# Patient Record
Sex: Female | Born: 1950 | Race: Black or African American | Hispanic: No | Marital: Single | Smoking: Former smoker
Health system: Southern US, Community
[De-identification: ages and names within clinical notes are randomized; demographics above are authoritative.]

## PROBLEM LIST (undated history)

## (undated) DIAGNOSIS — I639 Cerebral infarction, unspecified: Secondary | ICD-10-CM

## (undated) DIAGNOSIS — I2510 Atherosclerotic heart disease of native coronary artery without angina pectoris: Secondary | ICD-10-CM

## (undated) DIAGNOSIS — D71 Functional disorders of polymorphonuclear neutrophils: Secondary | ICD-10-CM

## (undated) DIAGNOSIS — I5189 Other ill-defined heart diseases: Secondary | ICD-10-CM

## (undated) DIAGNOSIS — Z9841 Cataract extraction status, right eye: Secondary | ICD-10-CM

## (undated) DIAGNOSIS — C801 Malignant (primary) neoplasm, unspecified: Secondary | ICD-10-CM

## (undated) DIAGNOSIS — K760 Fatty (change of) liver, not elsewhere classified: Secondary | ICD-10-CM

## (undated) DIAGNOSIS — E785 Hyperlipidemia, unspecified: Secondary | ICD-10-CM

## (undated) DIAGNOSIS — K219 Gastro-esophageal reflux disease without esophagitis: Secondary | ICD-10-CM

## (undated) DIAGNOSIS — B019 Varicella without complication: Secondary | ICD-10-CM

## (undated) DIAGNOSIS — I10 Essential (primary) hypertension: Secondary | ICD-10-CM

## (undated) DIAGNOSIS — E236 Other disorders of pituitary gland: Secondary | ICD-10-CM

## (undated) DIAGNOSIS — J45909 Unspecified asthma, uncomplicated: Secondary | ICD-10-CM

## (undated) DIAGNOSIS — Z9842 Cataract extraction status, left eye: Secondary | ICD-10-CM

## (undated) DIAGNOSIS — M1990 Unspecified osteoarthritis, unspecified site: Secondary | ICD-10-CM

## (undated) HISTORY — DX: Cerebral infarction, unspecified: I63.9

## (undated) HISTORY — DX: Fatty (change of) liver, not elsewhere classified: K76.0

## (undated) HISTORY — DX: Functional disorders of polymorphonuclear neutrophils: D71

## (undated) HISTORY — DX: Varicella without complication: B01.9

## (undated) HISTORY — DX: Unspecified osteoarthritis, unspecified site: M19.90

## (undated) HISTORY — DX: Unspecified asthma, uncomplicated: J45.909

## (undated) HISTORY — DX: Other ill-defined heart diseases: I51.89

## (undated) HISTORY — DX: Atherosclerotic heart disease of native coronary artery without angina pectoris: I25.10

## (undated) HISTORY — PX: TONSILLECTOMY AND ADENOIDECTOMY: SHX28

## (undated) HISTORY — DX: Hyperlipidemia, unspecified: E78.5

## (undated) HISTORY — DX: Other disorders of pituitary gland: E23.6

## (undated) HISTORY — DX: Gastro-esophageal reflux disease without esophagitis: K21.9

## (undated) HISTORY — DX: Essential (primary) hypertension: I10

---

## 1990-06-08 HISTORY — PX: CARPAL TUNNEL RELEASE: SHX101

## 1997-12-03 ENCOUNTER — Other Ambulatory Visit: Admission: RE | Admit: 1997-12-03 | Discharge: 1997-12-03 | Payer: Self-pay | Admitting: Family Medicine

## 1997-12-27 ENCOUNTER — Other Ambulatory Visit: Admission: RE | Admit: 1997-12-27 | Discharge: 1997-12-27 | Payer: Self-pay | Admitting: Family Medicine

## 1998-02-05 ENCOUNTER — Ambulatory Visit (HOSPITAL_COMMUNITY): Admission: RE | Admit: 1998-02-05 | Discharge: 1998-02-05 | Payer: Self-pay | Admitting: Family Medicine

## 1998-04-20 ENCOUNTER — Encounter: Payer: Self-pay | Admitting: Emergency Medicine

## 1998-04-21 ENCOUNTER — Inpatient Hospital Stay (HOSPITAL_COMMUNITY): Admission: EM | Admit: 1998-04-21 | Discharge: 1998-04-21 | Payer: Self-pay | Admitting: Emergency Medicine

## 1998-07-30 ENCOUNTER — Ambulatory Visit (HOSPITAL_COMMUNITY): Admission: RE | Admit: 1998-07-30 | Discharge: 1998-07-30 | Payer: Self-pay | Admitting: Internal Medicine

## 1998-07-30 ENCOUNTER — Encounter: Payer: Self-pay | Admitting: Internal Medicine

## 1998-09-16 ENCOUNTER — Emergency Department (HOSPITAL_COMMUNITY): Admission: EM | Admit: 1998-09-16 | Discharge: 1998-09-16 | Payer: Self-pay | Admitting: Emergency Medicine

## 1998-09-17 ENCOUNTER — Encounter: Payer: Self-pay | Admitting: Emergency Medicine

## 1998-11-07 ENCOUNTER — Encounter: Payer: Self-pay | Admitting: Emergency Medicine

## 1998-11-07 ENCOUNTER — Encounter: Payer: Self-pay | Admitting: *Deleted

## 1998-11-07 ENCOUNTER — Emergency Department (HOSPITAL_COMMUNITY): Admission: EM | Admit: 1998-11-07 | Discharge: 1998-11-07 | Payer: Self-pay | Admitting: Emergency Medicine

## 1998-12-18 ENCOUNTER — Emergency Department (HOSPITAL_COMMUNITY): Admission: EM | Admit: 1998-12-18 | Discharge: 1998-12-18 | Payer: Self-pay | Admitting: Emergency Medicine

## 1998-12-18 ENCOUNTER — Encounter: Payer: Self-pay | Admitting: Emergency Medicine

## 1999-06-09 DIAGNOSIS — I639 Cerebral infarction, unspecified: Secondary | ICD-10-CM

## 1999-06-09 HISTORY — DX: Cerebral infarction, unspecified: I63.9

## 1999-09-05 ENCOUNTER — Emergency Department (HOSPITAL_COMMUNITY): Admission: EM | Admit: 1999-09-05 | Discharge: 1999-09-05 | Payer: Self-pay | Admitting: Emergency Medicine

## 1999-09-05 ENCOUNTER — Encounter: Payer: Self-pay | Admitting: Emergency Medicine

## 1999-09-24 ENCOUNTER — Ambulatory Visit (HOSPITAL_COMMUNITY): Admission: RE | Admit: 1999-09-24 | Discharge: 1999-09-24 | Payer: Self-pay | Admitting: Family Medicine

## 1999-10-20 ENCOUNTER — Other Ambulatory Visit: Admission: RE | Admit: 1999-10-20 | Discharge: 1999-10-20 | Payer: Self-pay | Admitting: Obstetrics and Gynecology

## 1999-10-31 ENCOUNTER — Emergency Department (HOSPITAL_COMMUNITY): Admission: EM | Admit: 1999-10-31 | Discharge: 1999-10-31 | Payer: Self-pay | Admitting: Emergency Medicine

## 1999-11-03 ENCOUNTER — Emergency Department (HOSPITAL_COMMUNITY): Admission: EM | Admit: 1999-11-03 | Discharge: 1999-11-03 | Payer: Self-pay | Admitting: Emergency Medicine

## 1999-11-03 ENCOUNTER — Encounter: Payer: Self-pay | Admitting: Emergency Medicine

## 2000-02-03 ENCOUNTER — Emergency Department (HOSPITAL_COMMUNITY): Admission: EM | Admit: 2000-02-03 | Discharge: 2000-02-03 | Payer: Self-pay | Admitting: Emergency Medicine

## 2000-02-03 ENCOUNTER — Encounter: Payer: Self-pay | Admitting: Emergency Medicine

## 2000-02-14 ENCOUNTER — Ambulatory Visit (HOSPITAL_COMMUNITY): Admission: RE | Admit: 2000-02-14 | Discharge: 2000-02-14 | Payer: Self-pay | Admitting: *Deleted

## 2000-03-15 ENCOUNTER — Ambulatory Visit (HOSPITAL_COMMUNITY): Admission: RE | Admit: 2000-03-15 | Discharge: 2000-03-15 | Payer: Self-pay | Admitting: *Deleted

## 2000-03-23 ENCOUNTER — Inpatient Hospital Stay (HOSPITAL_COMMUNITY): Admission: EM | Admit: 2000-03-23 | Discharge: 2000-03-25 | Payer: Self-pay | Admitting: Emergency Medicine

## 2000-03-23 ENCOUNTER — Encounter: Payer: Self-pay | Admitting: Emergency Medicine

## 2000-03-25 ENCOUNTER — Encounter: Payer: Self-pay | Admitting: Internal Medicine

## 2000-03-30 ENCOUNTER — Encounter: Payer: Self-pay | Admitting: Neurology

## 2000-03-30 ENCOUNTER — Ambulatory Visit (HOSPITAL_COMMUNITY): Admission: RE | Admit: 2000-03-30 | Discharge: 2000-03-30 | Payer: Self-pay | Admitting: Neurology

## 2000-10-25 ENCOUNTER — Emergency Department (HOSPITAL_COMMUNITY): Admission: EM | Admit: 2000-10-25 | Discharge: 2000-10-25 | Payer: Self-pay | Admitting: Emergency Medicine

## 2000-10-25 ENCOUNTER — Encounter: Payer: Self-pay | Admitting: Emergency Medicine

## 2000-11-19 ENCOUNTER — Encounter: Payer: Self-pay | Admitting: Family Medicine

## 2000-11-19 ENCOUNTER — Ambulatory Visit (HOSPITAL_COMMUNITY): Admission: RE | Admit: 2000-11-19 | Discharge: 2000-11-19 | Payer: Self-pay | Admitting: Family Medicine

## 2001-04-11 ENCOUNTER — Encounter: Payer: Self-pay | Admitting: Family Medicine

## 2001-04-11 ENCOUNTER — Ambulatory Visit (HOSPITAL_COMMUNITY): Admission: RE | Admit: 2001-04-11 | Discharge: 2001-04-11 | Payer: Self-pay | Admitting: Family Medicine

## 2001-05-27 ENCOUNTER — Encounter: Payer: Self-pay | Admitting: General Surgery

## 2001-05-27 ENCOUNTER — Ambulatory Visit (HOSPITAL_COMMUNITY): Admission: RE | Admit: 2001-05-27 | Discharge: 2001-05-27 | Payer: Self-pay | Admitting: General Surgery

## 2001-06-08 HISTORY — PX: CHOLECYSTECTOMY: SHX55

## 2001-06-16 ENCOUNTER — Ambulatory Visit (HOSPITAL_COMMUNITY): Admission: RE | Admit: 2001-06-16 | Discharge: 2001-06-16 | Payer: Self-pay | Admitting: Family Medicine

## 2001-06-16 ENCOUNTER — Encounter: Payer: Self-pay | Admitting: Family Medicine

## 2001-06-20 ENCOUNTER — Encounter: Payer: Self-pay | Admitting: General Surgery

## 2001-06-22 ENCOUNTER — Encounter (INDEPENDENT_AMBULATORY_CARE_PROVIDER_SITE_OTHER): Payer: Self-pay | Admitting: Specialist

## 2001-06-22 ENCOUNTER — Observation Stay (HOSPITAL_COMMUNITY): Admission: RE | Admit: 2001-06-22 | Discharge: 2001-06-23 | Payer: Self-pay | Admitting: General Surgery

## 2001-07-02 ENCOUNTER — Encounter: Payer: Self-pay | Admitting: Emergency Medicine

## 2001-07-02 ENCOUNTER — Emergency Department (HOSPITAL_COMMUNITY): Admission: EM | Admit: 2001-07-02 | Discharge: 2001-07-03 | Payer: Self-pay | Admitting: Emergency Medicine

## 2001-08-05 ENCOUNTER — Emergency Department (HOSPITAL_COMMUNITY): Admission: EM | Admit: 2001-08-05 | Discharge: 2001-08-05 | Payer: Self-pay | Admitting: Emergency Medicine

## 2001-08-31 ENCOUNTER — Encounter: Payer: Self-pay | Admitting: Internal Medicine

## 2001-08-31 ENCOUNTER — Ambulatory Visit (HOSPITAL_COMMUNITY): Admission: RE | Admit: 2001-08-31 | Discharge: 2001-08-31 | Payer: Self-pay | Admitting: Internal Medicine

## 2001-09-03 ENCOUNTER — Emergency Department (HOSPITAL_COMMUNITY): Admission: EM | Admit: 2001-09-03 | Discharge: 2001-09-03 | Payer: Self-pay

## 2001-12-20 ENCOUNTER — Encounter: Payer: Self-pay | Admitting: Emergency Medicine

## 2001-12-20 ENCOUNTER — Emergency Department (HOSPITAL_COMMUNITY): Admission: EM | Admit: 2001-12-20 | Discharge: 2001-12-20 | Payer: Self-pay | Admitting: Emergency Medicine

## 2001-12-22 ENCOUNTER — Encounter: Admission: RE | Admit: 2001-12-22 | Discharge: 2001-12-22 | Payer: Self-pay | Admitting: Internal Medicine

## 2001-12-30 ENCOUNTER — Encounter: Admission: RE | Admit: 2001-12-30 | Discharge: 2002-03-30 | Payer: Self-pay | Admitting: Infectious Diseases

## 2002-01-25 ENCOUNTER — Encounter: Admission: RE | Admit: 2002-01-25 | Discharge: 2002-01-25 | Payer: Self-pay | Admitting: Internal Medicine

## 2002-02-01 ENCOUNTER — Encounter: Admission: RE | Admit: 2002-02-01 | Discharge: 2002-02-01 | Payer: Self-pay | Admitting: Internal Medicine

## 2002-02-17 ENCOUNTER — Encounter: Admission: RE | Admit: 2002-02-17 | Discharge: 2002-02-17 | Payer: Self-pay | Admitting: Internal Medicine

## 2002-02-22 ENCOUNTER — Encounter: Admission: RE | Admit: 2002-02-22 | Discharge: 2002-02-22 | Payer: Self-pay | Admitting: Internal Medicine

## 2002-05-24 ENCOUNTER — Encounter: Admission: RE | Admit: 2002-05-24 | Discharge: 2002-05-24 | Payer: Self-pay | Admitting: Internal Medicine

## 2002-06-28 ENCOUNTER — Encounter: Admission: RE | Admit: 2002-06-28 | Discharge: 2002-06-28 | Payer: Self-pay | Admitting: Internal Medicine

## 2002-07-06 ENCOUNTER — Ambulatory Visit (HOSPITAL_COMMUNITY): Admission: RE | Admit: 2002-07-06 | Discharge: 2002-07-06 | Payer: Self-pay | Admitting: Internal Medicine

## 2002-07-06 ENCOUNTER — Encounter: Payer: Self-pay | Admitting: Internal Medicine

## 2002-08-03 ENCOUNTER — Emergency Department (HOSPITAL_COMMUNITY): Admission: EM | Admit: 2002-08-03 | Discharge: 2002-08-03 | Payer: Self-pay | Admitting: *Deleted

## 2002-08-03 ENCOUNTER — Encounter: Payer: Self-pay | Admitting: *Deleted

## 2002-08-10 ENCOUNTER — Ambulatory Visit (HOSPITAL_COMMUNITY): Admission: RE | Admit: 2002-08-10 | Discharge: 2002-08-10 | Payer: Self-pay | Admitting: Internal Medicine

## 2002-08-10 ENCOUNTER — Encounter: Admission: RE | Admit: 2002-08-10 | Discharge: 2002-08-10 | Payer: Self-pay | Admitting: Internal Medicine

## 2002-11-16 ENCOUNTER — Encounter: Admission: RE | Admit: 2002-11-16 | Discharge: 2002-11-16 | Payer: Self-pay | Admitting: Internal Medicine

## 2002-11-21 ENCOUNTER — Ambulatory Visit (HOSPITAL_COMMUNITY): Admission: RE | Admit: 2002-11-21 | Discharge: 2002-11-21 | Payer: Self-pay | Admitting: Internal Medicine

## 2002-11-21 ENCOUNTER — Encounter: Payer: Self-pay | Admitting: Internal Medicine

## 2002-11-24 ENCOUNTER — Encounter: Admission: RE | Admit: 2002-11-24 | Discharge: 2002-11-24 | Payer: Self-pay | Admitting: Internal Medicine

## 2002-12-19 ENCOUNTER — Encounter: Admission: RE | Admit: 2002-12-19 | Discharge: 2002-12-19 | Payer: Self-pay | Admitting: Internal Medicine

## 2002-12-26 ENCOUNTER — Encounter: Admission: RE | Admit: 2002-12-26 | Discharge: 2002-12-26 | Payer: Self-pay | Admitting: Internal Medicine

## 2003-01-24 ENCOUNTER — Encounter: Admission: RE | Admit: 2003-01-24 | Discharge: 2003-01-24 | Payer: Self-pay | Admitting: Internal Medicine

## 2003-02-13 ENCOUNTER — Encounter: Admission: RE | Admit: 2003-02-13 | Discharge: 2003-02-13 | Payer: Self-pay | Admitting: Internal Medicine

## 2003-02-23 ENCOUNTER — Encounter: Admission: RE | Admit: 2003-02-23 | Discharge: 2003-02-23 | Payer: Self-pay | Admitting: Internal Medicine

## 2003-04-06 ENCOUNTER — Encounter: Admission: RE | Admit: 2003-04-06 | Discharge: 2003-04-06 | Payer: Self-pay | Admitting: Internal Medicine

## 2003-04-24 ENCOUNTER — Ambulatory Visit (HOSPITAL_COMMUNITY): Admission: RE | Admit: 2003-04-24 | Discharge: 2003-04-24 | Payer: Self-pay | Admitting: Internal Medicine

## 2003-04-24 ENCOUNTER — Encounter: Admission: RE | Admit: 2003-04-24 | Discharge: 2003-04-24 | Payer: Self-pay | Admitting: Internal Medicine

## 2003-06-12 ENCOUNTER — Encounter: Admission: RE | Admit: 2003-06-12 | Discharge: 2003-06-12 | Payer: Self-pay | Admitting: Internal Medicine

## 2003-06-29 ENCOUNTER — Encounter: Admission: RE | Admit: 2003-06-29 | Discharge: 2003-06-29 | Payer: Self-pay | Admitting: Internal Medicine

## 2003-07-03 ENCOUNTER — Encounter: Admission: RE | Admit: 2003-07-03 | Discharge: 2003-07-03 | Payer: Self-pay | Admitting: Internal Medicine

## 2003-07-11 ENCOUNTER — Encounter: Admission: RE | Admit: 2003-07-11 | Discharge: 2003-07-11 | Payer: Self-pay | Admitting: Internal Medicine

## 2003-07-18 ENCOUNTER — Encounter: Admission: RE | Admit: 2003-07-18 | Discharge: 2003-07-18 | Payer: Self-pay | Admitting: Internal Medicine

## 2003-07-18 ENCOUNTER — Ambulatory Visit (HOSPITAL_COMMUNITY): Admission: RE | Admit: 2003-07-18 | Discharge: 2003-07-18 | Payer: Self-pay | Admitting: Internal Medicine

## 2003-07-23 ENCOUNTER — Encounter: Admission: RE | Admit: 2003-07-23 | Discharge: 2003-09-05 | Payer: Self-pay | Admitting: Infectious Diseases

## 2003-08-24 ENCOUNTER — Encounter: Admission: RE | Admit: 2003-08-24 | Discharge: 2003-08-24 | Payer: Self-pay | Admitting: Internal Medicine

## 2003-08-24 ENCOUNTER — Ambulatory Visit (HOSPITAL_COMMUNITY): Admission: RE | Admit: 2003-08-24 | Discharge: 2003-08-24 | Payer: Self-pay | Admitting: Internal Medicine

## 2003-10-12 ENCOUNTER — Encounter: Admission: RE | Admit: 2003-10-12 | Discharge: 2003-10-12 | Payer: Self-pay | Admitting: Internal Medicine

## 2003-10-29 ENCOUNTER — Ambulatory Visit (HOSPITAL_COMMUNITY): Admission: RE | Admit: 2003-10-29 | Discharge: 2003-10-29 | Payer: Self-pay | Admitting: Family Medicine

## 2003-11-07 DIAGNOSIS — I2510 Atherosclerotic heart disease of native coronary artery without angina pectoris: Secondary | ICD-10-CM

## 2003-11-07 HISTORY — DX: Atherosclerotic heart disease of native coronary artery without angina pectoris: I25.10

## 2003-11-22 ENCOUNTER — Inpatient Hospital Stay (HOSPITAL_COMMUNITY): Admission: EM | Admit: 2003-11-22 | Discharge: 2003-11-24 | Payer: Self-pay | Admitting: Emergency Medicine

## 2003-11-23 ENCOUNTER — Encounter: Payer: Self-pay | Admitting: Cardiology

## 2003-11-28 ENCOUNTER — Encounter: Admission: RE | Admit: 2003-11-28 | Discharge: 2003-11-28 | Payer: Self-pay | Admitting: Internal Medicine

## 2003-12-12 ENCOUNTER — Encounter: Admission: RE | Admit: 2003-12-12 | Discharge: 2003-12-12 | Payer: Self-pay | Admitting: Internal Medicine

## 2003-12-26 ENCOUNTER — Ambulatory Visit (HOSPITAL_COMMUNITY): Admission: RE | Admit: 2003-12-26 | Discharge: 2003-12-26 | Payer: Self-pay | Admitting: Internal Medicine

## 2003-12-26 ENCOUNTER — Encounter: Admission: RE | Admit: 2003-12-26 | Discharge: 2003-12-26 | Payer: Self-pay | Admitting: Internal Medicine

## 2004-02-02 ENCOUNTER — Emergency Department (HOSPITAL_COMMUNITY): Admission: EM | Admit: 2004-02-02 | Discharge: 2004-02-03 | Payer: Self-pay | Admitting: Emergency Medicine

## 2004-02-08 ENCOUNTER — Ambulatory Visit: Payer: Self-pay | Admitting: Internal Medicine

## 2004-03-19 ENCOUNTER — Ambulatory Visit: Payer: Self-pay | Admitting: Internal Medicine

## 2004-03-26 ENCOUNTER — Ambulatory Visit: Payer: Self-pay | Admitting: Internal Medicine

## 2004-04-08 ENCOUNTER — Encounter: Admission: RE | Admit: 2004-04-08 | Discharge: 2004-04-08 | Payer: Self-pay | Admitting: Internal Medicine

## 2004-04-17 ENCOUNTER — Ambulatory Visit: Payer: Self-pay | Admitting: Internal Medicine

## 2004-05-21 ENCOUNTER — Emergency Department (HOSPITAL_COMMUNITY): Admission: EM | Admit: 2004-05-21 | Discharge: 2004-05-21 | Payer: Self-pay | Admitting: Emergency Medicine

## 2004-08-22 ENCOUNTER — Emergency Department (HOSPITAL_COMMUNITY): Admission: EM | Admit: 2004-08-22 | Discharge: 2004-08-22 | Payer: Self-pay | Admitting: Family Medicine

## 2004-09-11 ENCOUNTER — Ambulatory Visit: Payer: Self-pay | Admitting: Internal Medicine

## 2004-09-11 ENCOUNTER — Ambulatory Visit (HOSPITAL_COMMUNITY): Admission: RE | Admit: 2004-09-11 | Discharge: 2004-09-11 | Payer: Self-pay | Admitting: Internal Medicine

## 2004-10-09 ENCOUNTER — Ambulatory Visit: Payer: Self-pay | Admitting: Internal Medicine

## 2004-10-14 ENCOUNTER — Emergency Department (HOSPITAL_COMMUNITY): Admission: EM | Admit: 2004-10-14 | Discharge: 2004-10-14 | Payer: Self-pay | Admitting: Family Medicine

## 2004-10-16 ENCOUNTER — Encounter (INDEPENDENT_AMBULATORY_CARE_PROVIDER_SITE_OTHER): Payer: Self-pay | Admitting: *Deleted

## 2004-10-16 ENCOUNTER — Ambulatory Visit (HOSPITAL_COMMUNITY): Admission: RE | Admit: 2004-10-16 | Discharge: 2004-10-16 | Payer: Self-pay | Admitting: Internal Medicine

## 2004-11-16 IMAGING — CR DG CERVICAL SPINE COMPLETE 4+V
6 series · 6 of 6 positions shown · non-contrast
Comparison: none

CLINICAL DATA: Neck pain.  Muscle spasm.
 CERVICAL SPINE (FIVE VIEWS)
 Normal alignment.  No fracture.  Posterior spurring is present at C2-3 and C3-4 without significant foraminal stenosis.  Facet joints are intact. 
 IMPRESSION
 Mild spondylosis C2-3 and C3-4.

[view not recorded (1 of 6)]
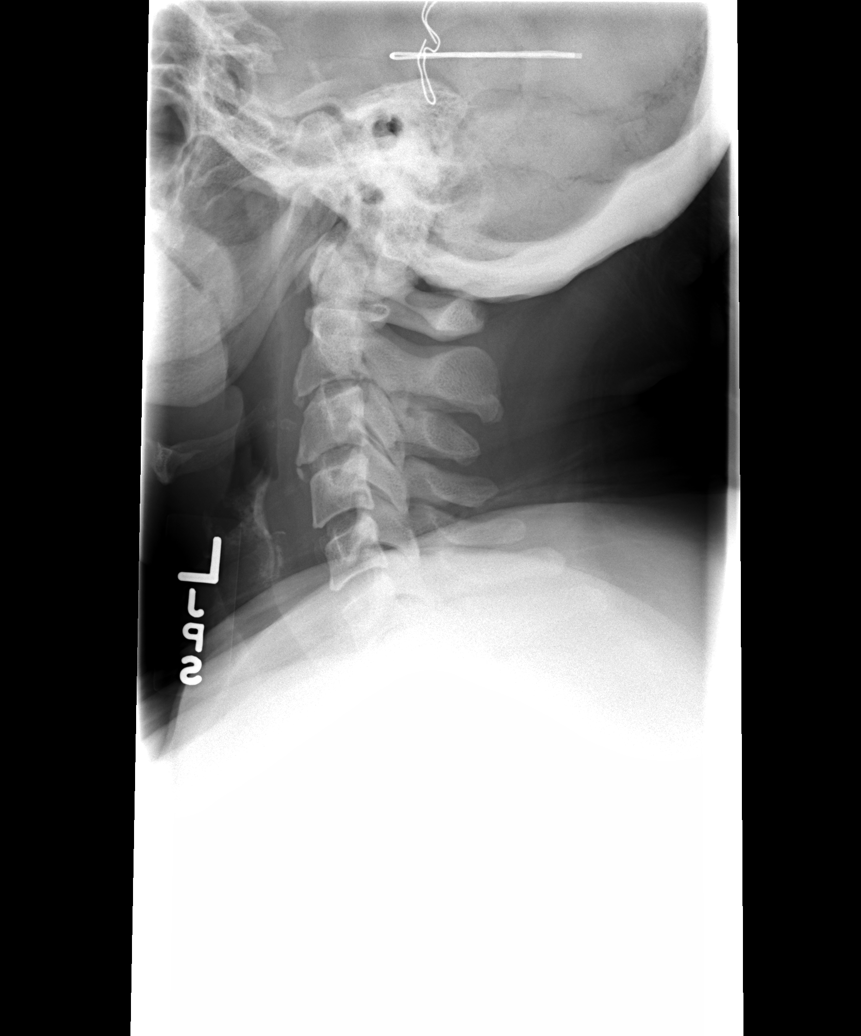

[view not recorded (2 of 6)]
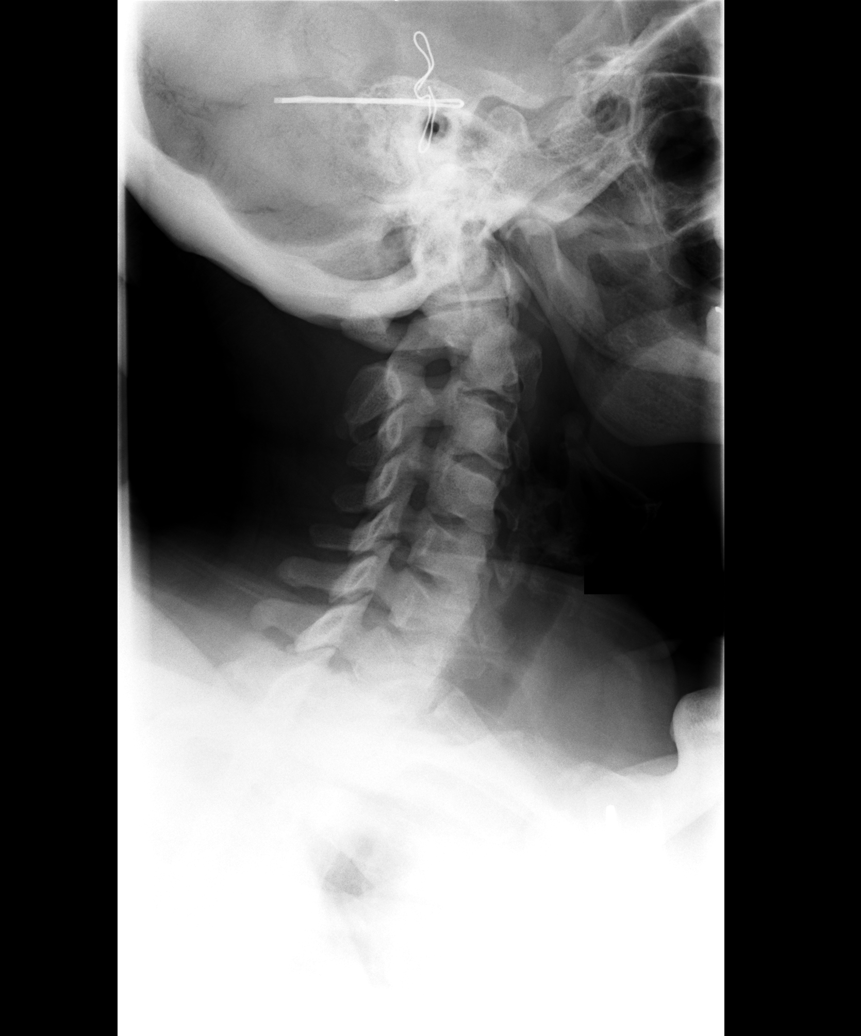

[view not recorded (3 of 6)]
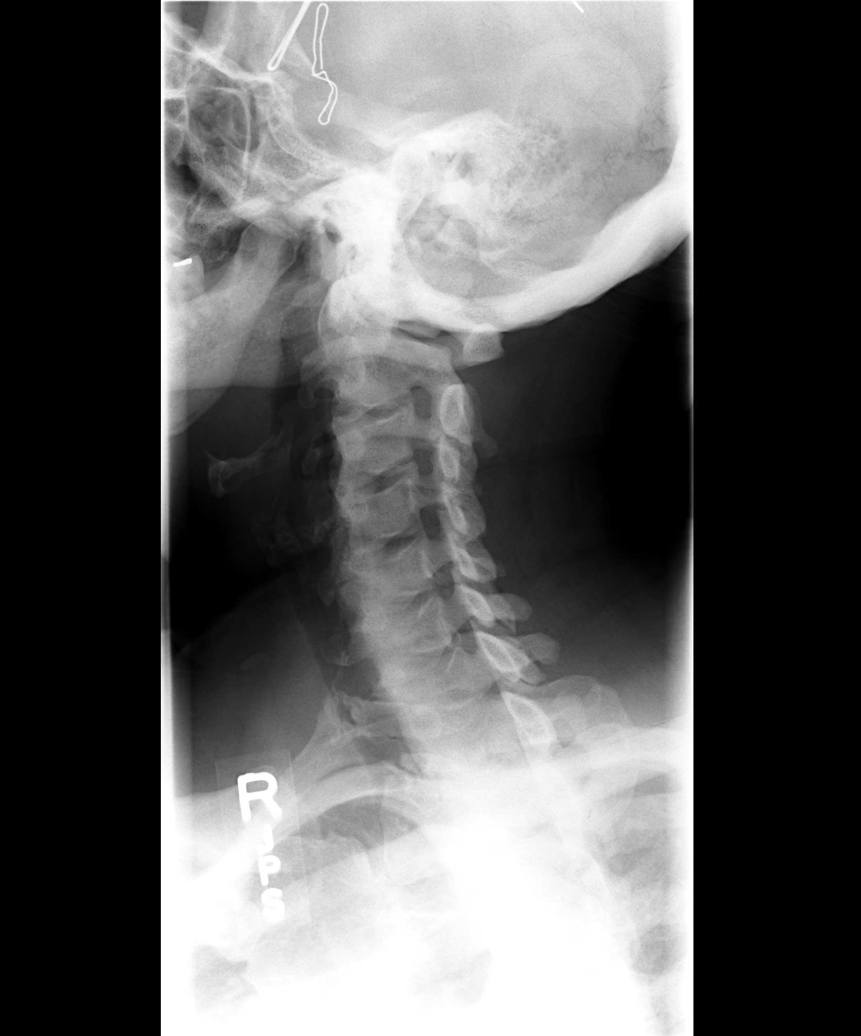

[view not recorded (4 of 6)]
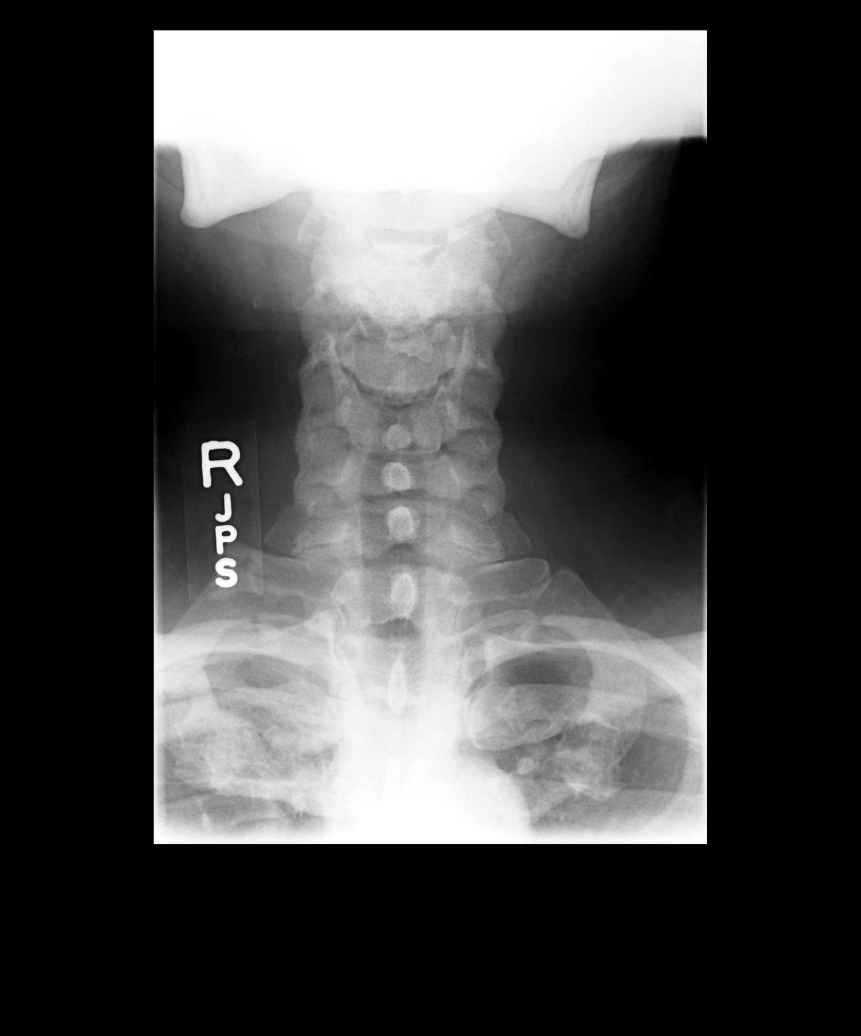

[view not recorded (5 of 6)]
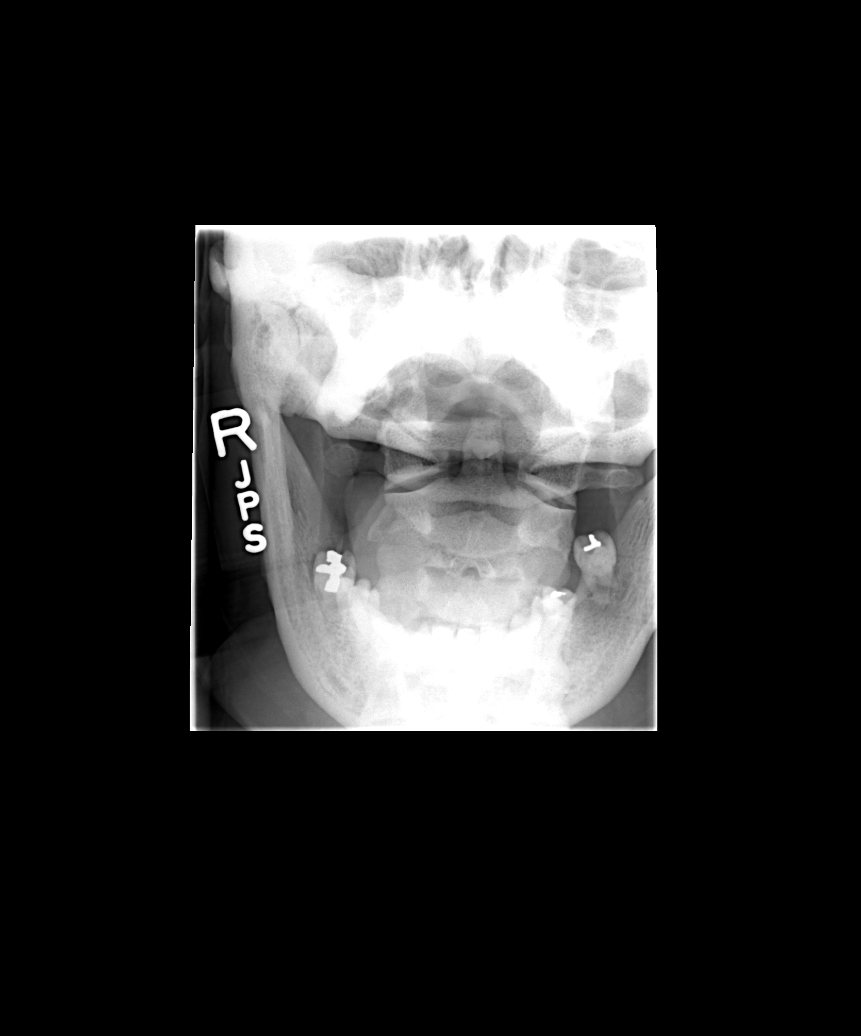

[view not recorded (6 of 6)]
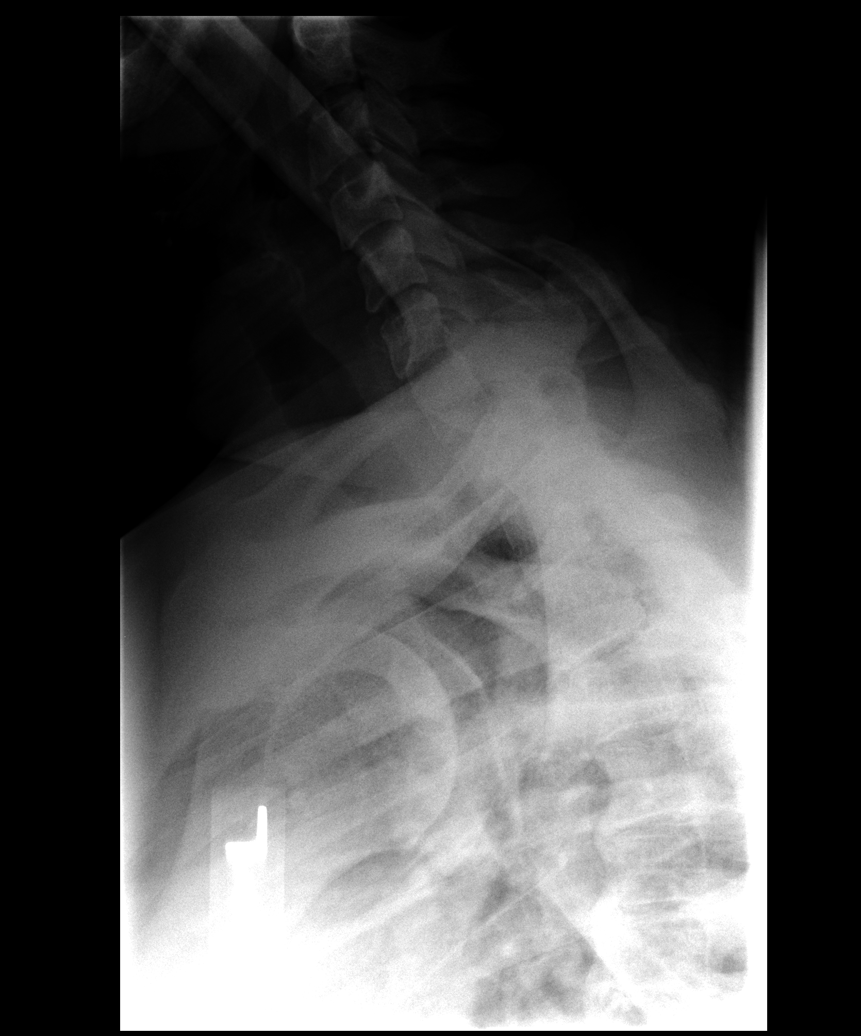

[6 of 6 positions shown; findings below may reference images not displayed]

## 2004-12-24 ENCOUNTER — Ambulatory Visit: Payer: Self-pay | Admitting: Hospitalist

## 2005-01-01 ENCOUNTER — Ambulatory Visit: Payer: Self-pay | Admitting: Internal Medicine

## 2005-01-15 ENCOUNTER — Ambulatory Visit: Payer: Self-pay | Admitting: Internal Medicine

## 2005-02-17 ENCOUNTER — Ambulatory Visit (HOSPITAL_COMMUNITY): Admission: RE | Admit: 2005-02-17 | Discharge: 2005-02-17 | Payer: Self-pay | Admitting: Internal Medicine

## 2005-02-17 ENCOUNTER — Ambulatory Visit: Payer: Self-pay | Admitting: Internal Medicine

## 2005-07-01 ENCOUNTER — Ambulatory Visit (HOSPITAL_COMMUNITY): Admission: RE | Admit: 2005-07-01 | Discharge: 2005-07-01 | Payer: Self-pay | Admitting: Internal Medicine

## 2005-07-02 ENCOUNTER — Emergency Department (HOSPITAL_COMMUNITY): Admission: EM | Admit: 2005-07-02 | Discharge: 2005-07-02 | Payer: Self-pay | Admitting: Family Medicine

## 2005-07-22 ENCOUNTER — Ambulatory Visit: Payer: Self-pay | Admitting: Internal Medicine

## 2005-09-04 ENCOUNTER — Ambulatory Visit: Payer: Self-pay | Admitting: Hospitalist

## 2005-09-18 ENCOUNTER — Ambulatory Visit: Payer: Self-pay | Admitting: Internal Medicine

## 2005-09-25 ENCOUNTER — Ambulatory Visit: Payer: Self-pay | Admitting: Hospitalist

## 2005-09-29 ENCOUNTER — Ambulatory Visit: Payer: Self-pay | Admitting: Internal Medicine

## 2005-09-29 ENCOUNTER — Ambulatory Visit (HOSPITAL_COMMUNITY): Admission: RE | Admit: 2005-09-29 | Discharge: 2005-09-29 | Payer: Self-pay | Admitting: Internal Medicine

## 2005-10-06 ENCOUNTER — Encounter: Payer: Self-pay | Admitting: Vascular Surgery

## 2005-10-06 ENCOUNTER — Ambulatory Visit (HOSPITAL_COMMUNITY): Admission: RE | Admit: 2005-10-06 | Discharge: 2005-10-06 | Payer: Self-pay | Admitting: Internal Medicine

## 2005-10-13 ENCOUNTER — Ambulatory Visit: Payer: Self-pay | Admitting: Hospitalist

## 2005-11-12 ENCOUNTER — Ambulatory Visit: Payer: Self-pay | Admitting: Hospitalist

## 2005-11-27 ENCOUNTER — Ambulatory Visit: Payer: Self-pay | Admitting: Internal Medicine

## 2006-03-23 ENCOUNTER — Ambulatory Visit: Payer: Self-pay | Admitting: Internal Medicine

## 2006-03-23 ENCOUNTER — Ambulatory Visit: Payer: Self-pay | Admitting: *Deleted

## 2006-03-23 ENCOUNTER — Inpatient Hospital Stay (HOSPITAL_COMMUNITY): Admission: AD | Admit: 2006-03-23 | Discharge: 2006-03-26 | Payer: Self-pay | Admitting: Hospitalist

## 2006-03-23 ENCOUNTER — Ambulatory Visit: Payer: Self-pay | Admitting: Hospitalist

## 2006-03-24 ENCOUNTER — Encounter: Payer: Self-pay | Admitting: Internal Medicine

## 2006-03-30 ENCOUNTER — Encounter (INDEPENDENT_AMBULATORY_CARE_PROVIDER_SITE_OTHER): Payer: Self-pay | Admitting: Hospitalist

## 2006-03-30 ENCOUNTER — Ambulatory Visit: Payer: Self-pay | Admitting: Internal Medicine

## 2006-03-30 LAB — CONVERTED CEMR LAB
ALT: 52 units/L — ABNORMAL HIGH (ref 0–40)
AST: 22 units/L (ref 0–37)
Albumin: 4 g/dL (ref 3.5–5.2)
Alkaline Phosphatase: 79 units/L (ref 39–117)
BUN: 19 mg/dL (ref 6–23)
CO2: 30 meq/L (ref 19–32)
Calcium: 9.3 mg/dL (ref 8.4–10.5)
Chloride: 100 meq/L (ref 96–112)
Creatinine, Ser: 1.21 mg/dL — ABNORMAL HIGH (ref 0.40–1.20)
Glucose, Bld: 108 mg/dL — ABNORMAL HIGH (ref 70–99)
Potassium: 3.8 meq/L (ref 3.5–5.3)
Sodium: 140 meq/L (ref 135–145)
Total Bilirubin: 0.4 mg/dL (ref 0.3–1.2)
Total Protein: 7.3 g/dL (ref 6.0–8.3)

## 2006-04-15 ENCOUNTER — Ambulatory Visit: Payer: Self-pay | Admitting: Hospitalist

## 2006-04-15 LAB — CONVERTED CEMR LAB
ALT: 49 units/L — ABNORMAL HIGH (ref 0–35)
AST: 26 units/L (ref 0–37)
Albumin: 3.6 g/dL (ref 3.5–5.2)
Alkaline Phosphatase: 83 units/L (ref 39–117)
BUN: 10 mg/dL (ref 6–23)
CO2: 31 meq/L (ref 19–32)
Calcium: 9.3 mg/dL (ref 8.4–10.5)
Chloride: 101 meq/L (ref 96–112)
Creatinine, Ser: 0.9 mg/dL (ref 0.40–1.20)
Glucose, Bld: 125 mg/dL — ABNORMAL HIGH (ref 70–99)
Potassium: 3.5 meq/L (ref 3.5–5.3)
Sodium: 138 meq/L (ref 135–145)
Total Bilirubin: 0.6 mg/dL (ref 0.3–1.2)
Total Protein: 7.1 g/dL (ref 6.0–8.3)

## 2006-06-17 DIAGNOSIS — Z8719 Personal history of other diseases of the digestive system: Secondary | ICD-10-CM | POA: Insufficient documentation

## 2006-06-17 DIAGNOSIS — E119 Type 2 diabetes mellitus without complications: Secondary | ICD-10-CM | POA: Insufficient documentation

## 2006-06-17 DIAGNOSIS — R740 Nonspecific elevation of levels of transaminase and lactic acid dehydrogenase [LDH]: Secondary | ICD-10-CM

## 2006-06-17 DIAGNOSIS — E785 Hyperlipidemia, unspecified: Secondary | ICD-10-CM | POA: Insufficient documentation

## 2006-06-17 DIAGNOSIS — M79609 Pain in unspecified limb: Secondary | ICD-10-CM | POA: Insufficient documentation

## 2006-06-17 DIAGNOSIS — K219 Gastro-esophageal reflux disease without esophagitis: Secondary | ICD-10-CM | POA: Insufficient documentation

## 2006-06-17 DIAGNOSIS — I6789 Other cerebrovascular disease: Secondary | ICD-10-CM | POA: Insufficient documentation

## 2006-06-17 DIAGNOSIS — I428 Other cardiomyopathies: Secondary | ICD-10-CM | POA: Insufficient documentation

## 2006-06-17 DIAGNOSIS — Z9089 Acquired absence of other organs: Secondary | ICD-10-CM | POA: Insufficient documentation

## 2006-06-17 DIAGNOSIS — E1165 Type 2 diabetes mellitus with hyperglycemia: Secondary | ICD-10-CM | POA: Insufficient documentation

## 2006-06-17 DIAGNOSIS — R51 Headache: Secondary | ICD-10-CM | POA: Insufficient documentation

## 2006-06-17 DIAGNOSIS — I519 Heart disease, unspecified: Secondary | ICD-10-CM | POA: Insufficient documentation

## 2006-06-17 DIAGNOSIS — M7730 Calcaneal spur, unspecified foot: Secondary | ICD-10-CM | POA: Insufficient documentation

## 2006-06-17 DIAGNOSIS — I10 Essential (primary) hypertension: Secondary | ICD-10-CM | POA: Insufficient documentation

## 2006-06-17 DIAGNOSIS — I2510 Atherosclerotic heart disease of native coronary artery without angina pectoris: Secondary | ICD-10-CM | POA: Insufficient documentation

## 2006-06-17 DIAGNOSIS — R519 Headache, unspecified: Secondary | ICD-10-CM | POA: Insufficient documentation

## 2006-06-17 DIAGNOSIS — M1712 Unilateral primary osteoarthritis, left knee: Secondary | ICD-10-CM

## 2006-06-17 DIAGNOSIS — M170 Bilateral primary osteoarthritis of knee: Secondary | ICD-10-CM | POA: Insufficient documentation

## 2006-06-17 DIAGNOSIS — R7402 Elevation of levels of lactic acid dehydrogenase (LDH): Secondary | ICD-10-CM | POA: Insufficient documentation

## 2006-06-27 DIAGNOSIS — D509 Iron deficiency anemia, unspecified: Secondary | ICD-10-CM | POA: Insufficient documentation

## 2006-06-27 DIAGNOSIS — D649 Anemia, unspecified: Secondary | ICD-10-CM | POA: Insufficient documentation

## 2006-08-03 ENCOUNTER — Telehealth: Payer: Self-pay | Admitting: *Deleted

## 2006-08-19 ENCOUNTER — Ambulatory Visit: Payer: Self-pay | Admitting: *Deleted

## 2006-08-19 DIAGNOSIS — H538 Other visual disturbances: Secondary | ICD-10-CM | POA: Insufficient documentation

## 2006-08-19 DIAGNOSIS — J329 Chronic sinusitis, unspecified: Secondary | ICD-10-CM | POA: Insufficient documentation

## 2006-08-19 LAB — CONVERTED CEMR LAB: Blood Glucose, Fingerstick: 135

## 2006-09-30 ENCOUNTER — Ambulatory Visit: Payer: Self-pay | Admitting: Hospitalist

## 2006-09-30 DIAGNOSIS — M6740 Ganglion, unspecified site: Secondary | ICD-10-CM | POA: Insufficient documentation

## 2006-09-30 LAB — CONVERTED CEMR LAB
Blood Glucose, Fingerstick: 221
Hgb A1c MFr Bld: 7.5 %

## 2006-10-01 ENCOUNTER — Telehealth (INDEPENDENT_AMBULATORY_CARE_PROVIDER_SITE_OTHER): Payer: Self-pay | Admitting: *Deleted

## 2006-10-22 ENCOUNTER — Telehealth: Payer: Self-pay | Admitting: *Deleted

## 2007-04-20 ENCOUNTER — Ambulatory Visit: Payer: Self-pay | Admitting: Hospitalist

## 2007-04-20 DIAGNOSIS — N950 Postmenopausal bleeding: Secondary | ICD-10-CM | POA: Insufficient documentation

## 2007-04-20 LAB — CONVERTED CEMR LAB
Blood Glucose, Fingerstick: 155
Candida species: NEGATIVE
Gardnerella vaginalis: NEGATIVE
Hgb A1c MFr Bld: 7.4 %
Trichomonal Vaginitis: NEGATIVE

## 2007-04-22 ENCOUNTER — Telehealth (INDEPENDENT_AMBULATORY_CARE_PROVIDER_SITE_OTHER): Payer: Self-pay | Admitting: *Deleted

## 2007-04-24 DIAGNOSIS — E876 Hypokalemia: Secondary | ICD-10-CM | POA: Insufficient documentation

## 2007-04-24 LAB — CONVERTED CEMR LAB
ALT: 41 units/L — ABNORMAL HIGH (ref 0–35)
AST: 20 units/L (ref 0–37)
Albumin: 4.1 g/dL (ref 3.5–5.2)
Alkaline Phosphatase: 87 units/L (ref 39–117)
BUN: 15 mg/dL (ref 6–23)
Basophils Absolute: 0 10*3/uL (ref 0.0–0.1)
Basophils Relative: 1 % (ref 0–1)
CO2: 28 meq/L (ref 19–32)
Calcium: 9.4 mg/dL (ref 8.4–10.5)
Chloride: 100 meq/L (ref 96–112)
Cholesterol: 174 mg/dL (ref 0–200)
Creatinine, Ser: 0.91 mg/dL (ref 0.40–1.20)
Eosinophils Absolute: 0.1 10*3/uL — ABNORMAL LOW (ref 0.2–0.7)
Eosinophils Relative: 2 % (ref 0–5)
Glucose, Bld: 137 mg/dL — ABNORMAL HIGH (ref 70–99)
HCT: 42.1 % (ref 36.0–46.0)
HDL: 42 mg/dL (ref >39)
Hemoglobin: 13.6 g/dL (ref 12.0–15.0)
LDL Cholesterol: 116 mg/dL — ABNORMAL HIGH (ref 0–99)
Lymphocytes Relative: 39 % (ref 12–46)
Lymphs Abs: 1.7 10*3/uL (ref 0.7–4.0)
MCHC: 32.3 g/dL (ref 30.0–36.0)
MCV: 79 fL (ref 78.0–100.0)
Monocytes Absolute: 0.5 10*3/uL (ref 0.1–1.0)
Monocytes Relative: 11 % (ref 3–12)
Neutro Abs: 2.1 10*3/uL (ref 1.7–7.7)
Neutrophils Relative %: 48 % (ref 43–77)
Platelets: 301 10*3/uL (ref 150–400)
Potassium: 3.3 meq/L — ABNORMAL LOW (ref 3.5–5.3)
RBC: 5.33 M/uL — ABNORMAL HIGH (ref 3.87–5.11)
RDW: 15.6 % — ABNORMAL HIGH (ref 11.5–15.5)
Sodium: 141 meq/L (ref 135–145)
Total Bilirubin: 0.4 mg/dL (ref 0.3–1.2)
Total CHOL/HDL Ratio: 4.1
Total Protein: 7.6 g/dL (ref 6.0–8.3)
Triglycerides: 79 mg/dL (ref <150)
VLDL: 16 mg/dL (ref 0–40)
WBC: 4.4 10*3/uL (ref 4.0–10.5)

## 2007-04-25 ENCOUNTER — Telehealth: Payer: Self-pay | Admitting: *Deleted

## 2007-04-26 ENCOUNTER — Encounter (INDEPENDENT_AMBULATORY_CARE_PROVIDER_SITE_OTHER): Payer: Self-pay | Admitting: Hospitalist

## 2007-04-26 ENCOUNTER — Telehealth: Payer: Self-pay | Admitting: *Deleted

## 2007-05-23 ENCOUNTER — Ambulatory Visit: Payer: Self-pay | Admitting: Hospitalist

## 2007-05-23 LAB — CONVERTED CEMR LAB: Blood Glucose, Fingerstick: 148

## 2007-05-26 ENCOUNTER — Telehealth (INDEPENDENT_AMBULATORY_CARE_PROVIDER_SITE_OTHER): Payer: Self-pay | Admitting: Hospitalist

## 2007-06-22 ENCOUNTER — Encounter (INDEPENDENT_AMBULATORY_CARE_PROVIDER_SITE_OTHER): Payer: Self-pay | Admitting: Hospitalist

## 2007-06-22 ENCOUNTER — Ambulatory Visit: Payer: Self-pay | Admitting: Obstetrics and Gynecology

## 2007-06-22 ENCOUNTER — Ambulatory Visit (HOSPITAL_COMMUNITY): Admission: RE | Admit: 2007-06-22 | Discharge: 2007-06-22 | Payer: Self-pay | Admitting: Hospitalist

## 2007-06-22 ENCOUNTER — Other Ambulatory Visit: Admission: RE | Admit: 2007-06-22 | Discharge: 2007-06-22 | Payer: Self-pay | Admitting: Obstetrics and Gynecology

## 2007-06-22 ENCOUNTER — Encounter: Payer: Self-pay | Admitting: Obstetrics and Gynecology

## 2007-06-27 LAB — HM MAMMOGRAPHY: HM Mammogram: NEGATIVE

## 2007-06-29 ENCOUNTER — Ambulatory Visit (HOSPITAL_COMMUNITY): Admission: RE | Admit: 2007-06-29 | Discharge: 2007-06-29 | Payer: Self-pay | Admitting: Obstetrics and Gynecology

## 2007-09-02 ENCOUNTER — Emergency Department (HOSPITAL_COMMUNITY): Admission: EM | Admit: 2007-09-02 | Discharge: 2007-09-02 | Payer: Self-pay | Admitting: Family Medicine

## 2007-09-06 ENCOUNTER — Telehealth (INDEPENDENT_AMBULATORY_CARE_PROVIDER_SITE_OTHER): Payer: Self-pay | Admitting: Hospitalist

## 2007-09-26 ENCOUNTER — Telehealth (INDEPENDENT_AMBULATORY_CARE_PROVIDER_SITE_OTHER): Payer: Self-pay | Admitting: Hospitalist

## 2007-09-27 ENCOUNTER — Encounter (INDEPENDENT_AMBULATORY_CARE_PROVIDER_SITE_OTHER): Payer: Self-pay | Admitting: Internal Medicine

## 2007-09-27 ENCOUNTER — Ambulatory Visit: Payer: Self-pay | Admitting: Hospitalist

## 2007-09-27 DIAGNOSIS — M6740 Ganglion, unspecified site: Secondary | ICD-10-CM | POA: Insufficient documentation

## 2007-09-27 LAB — CONVERTED CEMR LAB
Creatinine, Urine: 98.3 mg/dL
Microalb Creat Ratio: 109.9 mg/g — ABNORMAL HIGH (ref 0.0–30.0)
Microalb, Ur: 10.8 mg/dL — ABNORMAL HIGH (ref 0.00–1.89)

## 2007-11-15 ENCOUNTER — Telehealth: Payer: Self-pay | Admitting: *Deleted

## 2007-11-24 ENCOUNTER — Telehealth (INDEPENDENT_AMBULATORY_CARE_PROVIDER_SITE_OTHER): Payer: Self-pay | Admitting: *Deleted

## 2007-12-23 ENCOUNTER — Encounter: Payer: Self-pay | Admitting: Internal Medicine

## 2007-12-23 ENCOUNTER — Ambulatory Visit: Payer: Self-pay | Admitting: Infectious Diseases

## 2007-12-23 LAB — CONVERTED CEMR LAB
Alkaline Phosphatase: 91 units/L (ref 39–117)
BUN: 18 mg/dL (ref 6–23)
Cholesterol: 177 mg/dL (ref 0–200)
Creatinine, Ser: 1.02 mg/dL (ref 0.40–1.20)
Glucose, Bld: 213 mg/dL — ABNORMAL HIGH (ref 70–99)
HDL: 46 mg/dL (ref >39)
LDL Cholesterol: 112 mg/dL — ABNORMAL HIGH (ref 0–99)
Total Bilirubin: 0.3 mg/dL (ref 0.3–1.2)
Triglycerides: 97 mg/dL (ref <150)
VLDL: 19 mg/dL (ref 0–40)

## 2008-01-18 ENCOUNTER — Encounter: Admission: RE | Admit: 2008-01-18 | Discharge: 2008-03-07 | Payer: Self-pay | Admitting: Internal Medicine

## 2008-02-01 ENCOUNTER — Encounter (INDEPENDENT_AMBULATORY_CARE_PROVIDER_SITE_OTHER): Payer: Self-pay | Admitting: Internal Medicine

## 2008-02-14 ENCOUNTER — Encounter: Payer: Self-pay | Admitting: Internal Medicine

## 2008-03-13 ENCOUNTER — Telehealth: Payer: Self-pay | Admitting: *Deleted

## 2008-03-15 ENCOUNTER — Ambulatory Visit: Payer: Self-pay | Admitting: Internal Medicine

## 2008-03-15 ENCOUNTER — Encounter (INDEPENDENT_AMBULATORY_CARE_PROVIDER_SITE_OTHER): Payer: Self-pay | Admitting: Internal Medicine

## 2008-03-15 ENCOUNTER — Encounter: Payer: Self-pay | Admitting: Internal Medicine

## 2008-03-15 LAB — CONVERTED CEMR LAB
AST: 34 units/L (ref 0–37)
Albumin: 3.9 g/dL (ref 3.5–5.2)
Alkaline Phosphatase: 88 units/L (ref 39–117)
BUN: 11 mg/dL (ref 6–23)
Basophils Relative: 1 % (ref 0–1)
Eosinophils Absolute: 0.1 10*3/uL (ref 0.0–0.7)
Eosinophils Relative: 1 % (ref 0–5)
Free T4: 1.22 ng/dL (ref 0.89–1.80)
Glucose, Bld: 260 mg/dL — ABNORMAL HIGH (ref 70–99)
HCT: 40.1 % (ref 36.0–46.0)
MCHC: 32.2 g/dL (ref 30.0–36.0)
MCV: 77.4 fL — ABNORMAL LOW (ref 78.0–100.0)
Monocytes Relative: 8 % (ref 3–12)
Neutrophils Relative %: 61 % (ref 43–77)
Platelets: 304 10*3/uL (ref 150–400)
Potassium: 3.5 meq/L (ref 3.5–5.3)
Sodium: 140 meq/L (ref 135–145)
Total Bilirubin: 0.4 mg/dL (ref 0.3–1.2)

## 2008-03-28 ENCOUNTER — Encounter (INDEPENDENT_AMBULATORY_CARE_PROVIDER_SITE_OTHER): Payer: Self-pay | Admitting: Internal Medicine

## 2008-04-03 ENCOUNTER — Encounter (INDEPENDENT_AMBULATORY_CARE_PROVIDER_SITE_OTHER): Payer: Self-pay | Admitting: Internal Medicine

## 2008-04-04 ENCOUNTER — Encounter (INDEPENDENT_AMBULATORY_CARE_PROVIDER_SITE_OTHER): Payer: Self-pay | Admitting: Internal Medicine

## 2008-05-14 ENCOUNTER — Encounter (INDEPENDENT_AMBULATORY_CARE_PROVIDER_SITE_OTHER): Payer: Self-pay | Admitting: Internal Medicine

## 2008-06-25 ENCOUNTER — Encounter (INDEPENDENT_AMBULATORY_CARE_PROVIDER_SITE_OTHER): Payer: Self-pay | Admitting: Internal Medicine

## 2008-06-28 ENCOUNTER — Encounter (INDEPENDENT_AMBULATORY_CARE_PROVIDER_SITE_OTHER): Payer: Self-pay | Admitting: Internal Medicine

## 2008-08-27 ENCOUNTER — Telehealth (INDEPENDENT_AMBULATORY_CARE_PROVIDER_SITE_OTHER): Payer: Self-pay | Admitting: Internal Medicine

## 2008-09-27 ENCOUNTER — Encounter: Payer: Self-pay | Admitting: Internal Medicine

## 2008-09-27 ENCOUNTER — Ambulatory Visit: Payer: Self-pay | Admitting: Infectious Disease

## 2008-09-27 LAB — HM DIABETES FOOT EXAM: HM Diabetic Foot Exam: NEGATIVE

## 2008-09-27 LAB — CONVERTED CEMR LAB: Hgb A1c MFr Bld: 9.4 %

## 2008-09-28 ENCOUNTER — Encounter: Payer: Self-pay | Admitting: Internal Medicine

## 2008-09-28 ENCOUNTER — Telehealth: Payer: Self-pay | Admitting: Licensed Clinical Social Worker

## 2008-09-28 LAB — CONVERTED CEMR LAB
ALT: 82 U/L — ABNORMAL HIGH (ref 0–35)
AST: 49 U/L — ABNORMAL HIGH (ref 0–37)
Albumin: 3.8 g/dL (ref 3.5–5.2)
Alkaline Phosphatase: 94 U/L (ref 39–117)
BUN: 11 mg/dL (ref 6–23)
CO2: 27 meq/L (ref 19–32)
Calcium: 9.2 mg/dL (ref 8.4–10.5)
Chloride: 101 meq/L (ref 96–112)
Cholesterol: 180 mg/dL (ref 0–200)
Creatinine, Ser: 0.86 mg/dL (ref 0.40–1.20)
GFR calc Af Amer: >60 mL/min (ref >60)
GFR calc non Af Amer: >60 mL/min (ref >60)
Glucose, Bld: 150 mg/dL — ABNORMAL HIGH (ref 70–99)
HCT: 40.4 % (ref 36.0–46.0)
HDL: 41 mg/dL (ref >39)
Hemoglobin: 12.8 g/dL (ref 12.0–15.0)
LDL Cholesterol: 114 mg/dL — ABNORMAL HIGH (ref 0–99)
MCHC: 31.7 g/dL (ref 30.0–36.0)
MCV: 77.5 fL — ABNORMAL LOW (ref 78.0–100.0)
Platelets: 290 K/uL (ref 150–400)
Potassium: 3.4 meq/L — ABNORMAL LOW (ref 3.5–5.3)
RBC: 5.21 M/uL — ABNORMAL HIGH (ref 3.87–5.11)
RDW: 15.2 % (ref 11.5–15.5)
Sodium: 139 meq/L (ref 135–145)
Total Bilirubin: 0.3 mg/dL (ref 0.3–1.2)
Total CHOL/HDL Ratio: 4.4
Total Protein: 7.3 g/dL (ref 6.0–8.3)
Triglycerides: 126 mg/dL (ref <150)
VLDL: 25 mg/dL (ref 0–40)
WBC: 5.6 10*3/microliter (ref 4.0–10.5)

## 2008-10-29 ENCOUNTER — Encounter: Payer: Self-pay | Admitting: Internal Medicine

## 2008-11-27 ENCOUNTER — Encounter: Payer: Self-pay | Admitting: Internal Medicine

## 2008-12-06 ENCOUNTER — Telehealth: Payer: Self-pay | Admitting: Internal Medicine

## 2008-12-19 ENCOUNTER — Telehealth: Payer: Self-pay | Admitting: Internal Medicine

## 2009-01-14 ENCOUNTER — Encounter: Payer: Self-pay | Admitting: Internal Medicine

## 2009-01-25 ENCOUNTER — Ambulatory Visit: Payer: Self-pay | Admitting: Internal Medicine

## 2009-01-25 ENCOUNTER — Encounter (INDEPENDENT_AMBULATORY_CARE_PROVIDER_SITE_OTHER): Payer: Self-pay | Admitting: Internal Medicine

## 2009-01-25 ENCOUNTER — Ambulatory Visit (HOSPITAL_COMMUNITY): Admission: RE | Admit: 2009-01-25 | Discharge: 2009-01-25 | Payer: Self-pay | Admitting: Internal Medicine

## 2009-01-25 DIAGNOSIS — R0789 Other chest pain: Secondary | ICD-10-CM | POA: Insufficient documentation

## 2009-01-25 LAB — CONVERTED CEMR LAB
Blood Glucose, Fingerstick: 113
Hgb A1c MFr Bld: 8.7 %

## 2009-01-28 ENCOUNTER — Telehealth: Payer: Self-pay | Admitting: Internal Medicine

## 2009-01-28 LAB — CONVERTED CEMR LAB
CO2: 24 meq/L (ref 19–32)
Chloride: 99 meq/L (ref 96–112)
Creatinine, Ser: 0.88 mg/dL (ref 0.40–1.20)
Sodium: 142 meq/L (ref 135–145)

## 2009-01-30 ENCOUNTER — Telehealth (INDEPENDENT_AMBULATORY_CARE_PROVIDER_SITE_OTHER): Payer: Self-pay | Admitting: *Deleted

## 2009-04-03 ENCOUNTER — Telehealth: Payer: Self-pay | Admitting: Internal Medicine

## 2009-09-02 ENCOUNTER — Encounter: Payer: Self-pay | Admitting: Internal Medicine

## 2009-11-21 ENCOUNTER — Ambulatory Visit: Payer: Self-pay | Admitting: Internal Medicine

## 2009-11-21 LAB — CONVERTED CEMR LAB
Blood Glucose, Fingerstick: 257
LDL Goal: 100 mg/dL

## 2009-11-22 LAB — CONVERTED CEMR LAB
ALT: 59 units/L — ABNORMAL HIGH (ref 0–35)
AST: 29 units/L (ref 0–37)
CO2: 30 meq/L (ref 19–32)
Calcium: 9.3 mg/dL (ref 8.4–10.5)
Chloride: 94 meq/L — ABNORMAL LOW (ref 96–112)
Creatinine, Ser: 0.9 mg/dL (ref 0.40–1.20)
Sodium: 136 meq/L (ref 135–145)
Total Bilirubin: 0.6 mg/dL (ref 0.3–1.2)
Total Protein: 7.6 g/dL (ref 6.0–8.3)

## 2009-11-27 ENCOUNTER — Telehealth: Payer: Self-pay | Admitting: Internal Medicine

## 2010-03-19 ENCOUNTER — Ambulatory Visit (HOSPITAL_COMMUNITY): Admission: RE | Admit: 2010-03-19 | Discharge: 2010-03-19 | Payer: Self-pay | Admitting: Internal Medicine

## 2010-06-29 ENCOUNTER — Encounter: Payer: Self-pay | Admitting: Internal Medicine

## 2010-06-29 ENCOUNTER — Encounter: Payer: Self-pay | Admitting: Hospitalist

## 2010-07-10 NOTE — Letter (Signed)
Summary: AM MED/DIRECT BETTER CARE  AM MED/DIRECT BETTER CARE   Imported By: Margie Billet 09/12/2009 10:58:43  _____________________________________________________________________  External Attachment:    Type:   Image     Comment:   External Document

## 2010-07-10 NOTE — Assessment & Plan Note (Signed)
Summary: ACUTE-REGULAR CHECK UP/UNABLE TO SEE DR MAGICK/CFB   Vital Signs:  Patient profile:   60 year old female Height:      65 inches Weight:      280.9 pounds BMI:     46.91 Temp:     97.7 degrees F oral Pulse rate:   96 / minute BP sitting:   139 / 94  (right arm)  Vitals Entered By: Filomena Jungling NT II (November 21, 2009 3:29 PM) CC: abdominal pain/  ?virus Is Patient Diabetic? Yes Did you bring your meter with you today? No Pain Assessment Patient in pain? yes     Location: abdomen Intensity: 6 Type: burning Onset of pain  Intermittent Nutritional Status BMI of > 30 = obese CBG Result 257  Have you ever been in a relationship where you felt threatened, hurt or afraid?No   Does patient need assistance? Functional Status Self care Ambulation Normal    Primary Care Provider:  Ned Grace MD  CC:  abdominal pain/  ?virus.  History of Present Illness: Patient is a 60 year old woman with a PMH of DM, HTN, HLD and others listed below who presents for an acute visit with a chief complaint of stomach cramping, likely secondary to a stomach virus contracted from multiple sick contacts in her household:  Patient has recently had a viral illness with stomach cramps that are still present now. Have been going on for about two weeks now. Has had some generalized weakness as well from the viral illness (she was around several people with similar symptoms). Cramps come and go, eases up some with movements, worse when still or lying down. Denies nausea or vomiting. Denies any changes in her bowel habits, but states she always has alternating diarrhea and constipation.   Patient would like to get certified again for diabetic shoes. She states that she had been referred to sports medicine in the past for fitting of shoes and thought that the process there was much better than a podiatrist that she used previously as she states that the shoes do not seem to fit right now.  Patient states  that she needs to work on her blood pressure and blood sugar as she has not had good control over the past several months (per her report as we have not seen her since 01/2009). She is trying to lose weight. She has not been taking her metformin. She also has not been taking prilosec as she states it is not covered on her insurance.   Preventive Screening-Counseling & Management  Alcohol-Tobacco     Alcohol drinks/day: 0     Smoking Status: quit     Year Quit: 1987     Passive Smoke Exposure: yes  Caffeine-Diet-Exercise     Does Patient Exercise: no  Problems Prior to Update: 1)  Chest Pain, Atypical  (ICD-786.59) 2)  Arm Pain, Left  (ICD-729.5) 3)  Foot Pain, Left  (ICD-729.5) 4)  Ganglion Cyst, Wrist, Right  (ICD-727.41) 5)  Hypokalemia  (ICD-276.8) 6)  Postmenopausal Bleeding  (ICD-627.1) 7)  Family History of Alcoholism/addiction  (ICD-V61.41) 8)  Family History Diabetes 1st Degree Relative  (ICD-V18.0) 9)  Ganglion Cyst  (ICD-727.43) 10)  Blurred Vision  (ICD-368.8) 11)  Sinusitis  (ICD-473.9) 12)  Anemia-nos  (ICD-285.9) 13)  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) 14)  Hemorrhoids, Hx of  (ICD-V12.79) 15)  Transaminases, Serum, Elevated  (ICD-790.4) 16)  Obesity  (ICD-278.00) 17)  Leg Pain  (ICD-729.5) 18)  Cholecystectomy, Laparoscopic,  Hx of  (ICD-V45.79) 19)  Reactive Airway Disease  (ICD-493.90) 20)  Cardiomyopathy  (ICD-425.4) 21)  Diastolic Dysfunction  (ICD-429.9) 22)  Disease, Cerebrovascular Nec  (ICD-437.8) 23)  Spur, Calcaneal  (ICD-726.73) 24)  Osteoarthritis  (ICD-715.90) 25)  Myocardial Infarction, Hx of  (ICD-412) 26)  Hypertension  (ICD-401.9) 27)  Hyperlipidemia  (ICD-272.4) 28)  Headache  (ICD-784.0) 29)  Gerd  (ICD-530.81) 30)  Diabetes Mellitus, Type II  (ICD-250.00) 31)  Coronary Artery Disease  (ICD-414.00) 32)  Asthma  (ICD-493.90)  Current Medications (verified): 1)  Aspirin 81 Mg Tbec (Aspirin) .... Take 1 Tablet By Mouth Once A  Day 2)  Hydrochlorothiazide 25 Mg Tabs (Hydrochlorothiazide) .... Take 1 Tablet By Mouth Once A Day 3)  Glipizide 5 Mg Tabs (Glipizide) .... Take 1 Tablet By Mouth Twice Daily 4)  Metformin Hcl 1000 Mg Tabs (Metformin Hcl) .... Take 1 Tablet By Mouth Two Times A Day 5)  Albuterol 90 Mcg/act Aers (Albuterol) .... Inhale 2 Puffs As Needed For Shortness of Breath 6)  Flonase 50 Mcg/act  Susp (Fluticasone Propionate) .... 2 Sprays Per Nostril Once Daily 7)  Anusol-Hc 2.5 % Crea (Hydrocortisone) .... Apply Up To Four Times A Day As Needed 8)  Prilosec 20 Mg Cpdr (Omeprazole) .... Take 1 Tablet Twice Daily 9)  Potassium Chloride 20 Meq Pack (Potassium Chloride) .... Take 2 Tabs By Mouth Once A Day For  6 Days. 10)  Norvasc 10 Mg Tabs (Amlodipine Besylate) .... Take 1 Tablet Once Daily By Mouth  Allergies (verified): 1)  ! Lisinopril (Lisinopril) 2)  ! Metoprolol Tartrate (Metoprolol Tartrate) 3)  ! * Kcl 4)  ! Zocor 5)  ! Aggrenox 6)  ! Glucotrol (Glipizide) 7)  Protonix 8)  Prilosec 9)  Allegra (Fexofenadine Hcl) 10)  Nasonex 11)  Maxzide 12)  Flonase 13)  Lasix 14)  Lipitor 15)  Plavix 16)  Metformin Hcl (Metformin Hcl)  Past History:  Past Medical History: Last updated: 12/23/2007 Coronary artery disease--  RCA 60% stenosis,LAD diffuse- Pulsipher 6/05   cerebrovascular disease  - R frontoparietal cortical (age unknwon), MRI 9/01 -  SVD MRI 9/01 -  TIA? w/ nl CD and no MRI 5/07 Asthma Diabetes mellitus, type II- callus on Left foot, but otw low risk foot- 4/07 GERD, esoph dysmotiliy/spasm w/ prominent cricophary muscles - Esophagram 1/03;6/04 -  GI eval Gessner, EGD 5/05 Headache-chronic Hyperlipidemia Hypertension- nl LV echo5/06, neg proteinuria 4/06 Myocardial infarction, hx of- Osteoarthritis -  DJD ACjoint and coracoclav ligament 3/03 -  L patella osteophytes 6/02 -  L4-S1 spondylosis. DDD w/ bulge no imping MRI 10/01 -  mild SI MRI 10/01 -  C2-4 spondylosis w/o  stenoisis and w/ spurs- xray 7/05 -  DISH (diffuse idiopathic skeletal hyperostosis) w/ B hip/lumbar deg change xray 2/05 -  bilateral plantar calcaneal spurs diastolic dysfunction EF 55-65% wcho 5/06 empty sella syndrome, likely primary MRI 9/01 -  Related to obesity/ HTN. May contribute to HA and occ visual abnlties -  moniter pit status w/ TSH/ FT4; ACTH/cort; LG/ testosterone/ prolactin; GH. Mild hyperprolac <100 common -  Rx: nothing unless endocrinopathy  obesity, BMI? leg pain- neg dopp left 3/04; 4/06. nl ABI 5/07; perip edema w/ didysfx; albu nl4/07. 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 Hep C neg reactive airway disease -  peak flow 180 5/05 -  Chronic sinusitis w/ PND. Bronchitis 12/02. Strep pneumo 4/07 -  FVC66,  FEV60, Ratio69, JYNW29 (mod res/mild obs w/ mod redc diff)- spiro 5/05 hemorrhoids granulomatous disease Right upper lobe. Scar xray 12/05 Anemia, microcytic- nl B12/TSH/ Folate 12/03; Electrophoresis?  Social History: Last updated: 11/21/2009 Occupation:Former nursing assistant. Now on disability. Divorced.  Has 62 year old and 87 year old sons that do not live with her [employer] take care of a 62 year old relative (cousin). No smoking, no drinking, no other drugs.  Past Surgical History: Cholecystectomy, s/p lap secondary to cholethilthaisis Carolynne Edouard, 1/03 Carpal tunnel surgery on the L arm in 1992 or 93. Tonsils out at age 29.  Social History: Reviewed history from 09/30/2006 and no changes required. Occupation:Former nursing assistant. Now on disability. Divorced.  Has 62 year old and 31 year old sons that do not live with her Helps take care of a 62 year old relative (cousin). No smoking, no drinking, no other drugs.  Review of Systems      See HPI  Physical Exam  General:  alert, well-developed, well-nourished, well-hydrated, and overweight-appearing.   Head:  normocephalic and  atraumatic.   Eyes:  vision grossly intact.   Ears:  no external deformities.   Nose:  no external deformity, no external erythema, and no nasal discharge.   Mouth:  pharynx pink and moist, fair dentition, and teeth missing.   Lungs:  normal respiratory effort, normal breath sounds, no crackles, and no wheezes.   Heart:  regular rhythm, no murmur, no gallop, no rub, and tachycardia.   Abdomen:  soft, non-tender, normal bowel sounds, no guarding, no rigidity, and no rebound tenderness.   Neurologic:  alert & oriented X3 and cranial nerves II-XII intact.   Skin:  turgor normal, color normal, and no rashes.   Psych:  Oriented X3, memory intact for recent and remote, normally interactive, good eye contact, not anxious appearing, and not depressed appearing.     Impression & Recommendations:  Problem # 1:  HYPERTENSION (ICD-401.9) Rechecked at 139/94 and HR 96 after several minutes of rest. Was on Diovan in the past for renal protection as patient is diabetic. Will add back ARB as there was no allergy when patient took in the past. Recheck BMET at next appointment as well as follow BP. If still elevated, will increase ARB dose as needed. Have advised patient that we will have a better partnership and success treating both HTN and DM if we can see the patient more often, as we had originally scheduled the patient for a followup visit 1 month after previous appointment in 01/2009. She agrees to followup as scheduled in the future.  Her updated medication list for this problem includes:    Hydrochlorothiazide 25 Mg Tabs (Hydrochlorothiazide) .Marland Kitchen... Take 1 tablet by mouth once a day    Norvasc 10 Mg Tabs (Amlodipine besylate) .Marland Kitchen... Take 1 tablet once daily by mouth    Diovan 40 Mg Tabs (Valsartan) .Marland Kitchen... Take 1 tablet by mouth once a day  BP today: 153/102 Prior BP: 120/80 (01/25/2009)  10 Yr Risk Heart Disease: > 32 %  Labs Reviewed: K+: 3.2 (01/25/2009) Creat: : 0.88 (01/25/2009)   Chol: 180  (09/27/2008)   HDL: 41 (09/27/2008)   LDL: 114 (09/27/2008)   TG: 126 (09/27/2008)  Problem # 2:  DIABETES MELLITUS, TYPE II (ICD-250.00) Have advised patient to start back on glipizide, 500 mg two times a day for the first week and then to 1000 mg two times a day thereafter as she is restarting the medication and is just getting over  a stomach virus. Starting back on Diovan, lowest dose. Will refer to DME given that patient's A1c has deteriorated so much over the past few months. Patient requests referral to Sports Medicine due to her feet hurting with diabteic shoes fit by a podiatrist she saw in the past.  Her updated medication list for this problem includes:    Aspirin 81 Mg Tbec (Aspirin) .Marland Kitchen... Take 1 tablet by mouth once a day    Glipizide 5 Mg Tabs (Glipizide) .Marland Kitchen... Take 1 tablet by mouth twice daily    Metformin Hcl 1000 Mg Tabs (Metformin hcl) .Marland Kitchen... Take 1 tablet by mouth two times a day    Diovan 40 Mg Tabs (Valsartan) .Marland Kitchen... Take 1 tablet by mouth once a day  Orders: T- Capillary Blood Glucose ([id number]) T-Hgb A1C (in-house) (21308MV) DME Referral (DME) Sports Medicine (Sports Med)  Problem # 3:  HYPERLIPIDEMIA (ICD-272.4) Will check CMET today and recheck FLP at next visit.  Future Orders: T-Lipid Profile [id number]) ... 11/22/2009  Problem # 4:  GERD (ICD-530.81) No changes at this time. Patient states that she has had trouble paying for the medications. She also states that she is getting over a viral gastroenteritis. Have advised a bland diet and plenty of fluids for the next week. May followup as needed if not improved at that time. Restart metformin with titration up from 500 mg two times a day to 1000 mg two times a day after 1 week.  Her updated medication list for this problem includes:    Prilosec 20 Mg Cpdr (Omeprazole) .Marland Kitchen... Take 1 tablet twice daily  Problem # 5:  Preventive Health Care (ICD-V70.0) Will schedule mammogram & colonoscopy.  Problem # 6:  OBESITY  (ICD-278.00) Discussed goal of 30 for BMI, but with small steps of 1 pound per week. Will continue to follow progress at each visit to encourage continued weight loss. Patient already on a diet and has agreed to continue to be vigilant with her food choices and exercise.  Complete Medication List: 1)  Aspirin 81 Mg Tbec (Aspirin) .... Take 1 tablet by mouth once a day 2)  Hydrochlorothiazide 25 Mg Tabs (Hydrochlorothiazide) .... Take 1 tablet by mouth once a day 3)  Glipizide 5 Mg Tabs (Glipizide) .... Take 1 tablet by mouth twice daily 4)  Metformin Hcl 1000 Mg Tabs (Metformin hcl) .... Take 1 tablet by mouth two times a day 5)  Albuterol 90 Mcg/act Aers (Albuterol) .... Inhale 2 puffs as needed for shortness of breath 6)  Flonase 50 Mcg/act Susp (Fluticasone propionate) .... 2 sprays per nostril once daily 7)  Anusol-hc 2.5 % Crea (Hydrocortisone) .... Apply up to four times a day as needed 8)  Prilosec 20 Mg Cpdr (Omeprazole) .... Take 1 tablet twice daily 9)  Potassium Chloride 20 Meq Pack (Potassium chloride) .... Take 2 tabs by mouth once a day for  6 days. 10)  Norvasc 10 Mg Tabs (Amlodipine besylate) .... Take 1 tablet once daily by mouth 11)  Diovan 40 Mg Tabs (Valsartan) .... Take 1 tablet by mouth once a day  Other Orders: Mammogram (Screening) (Mammo) Gastroenterology Referral (GI) T-Comprehensive Metabolic Panel [id number])  Patient Instructions: 1)  Please schedule a follow-up appointment in 1 month. 2)  Please return for a FASTING Lipid Profile one (1) week before your next appointment as scheduled. 3)  It is important that you exercise regularly at least 20 minutes 5 times a week. If you develop chest pain, have severe difficulty breathing, or feel  very tired , stop exercising immediately and seek medical attention. 4)  You need to lose weight. Consider a lower calorie diet and regular exercise.  5)  Check your blood sugars regularly. If your readings are usually above 300  or below 70 you should contact our office. 6)  It is important that your Diabetic A1c level is checked every 3 months. 7)  See your eye doctor yearly to check for diabetic eye damage. 8)  Check your feet each night for sore areas, calluses or signs of infection. 9)  Check your Blood Pressure regularly. If it is above: 150/100 you should make an appointment. Prescriptions: PRILOSEC 20 MG CPDR (OMEPRAZOLE) Take 1 tablet twice daily  #60 x 3   Entered and Authorized by:   Nilda Riggs MD   Signed by:   Nilda Riggs MD on 11/21/2009   Method used:   Print then Give to Patient   RxID:   [id number] ALBUTEROL 90 MCG/ACT AERS (ALBUTEROL) Inhale 2 puffs as needed for shortness of breath  #1 MDI x 6   Entered and Authorized by:   Nilda Riggs MD   Signed by:   Nilda Riggs MD on 11/21/2009   Method used:   Print then Give to Patient   RxID:   [id number] METFORMIN HCL 1000 MG TABS (METFORMIN HCL) Take 1 tablet by mouth two times a day  #60 x 3   Entered and Authorized by:   Nilda Riggs MD   Signed by:   Nilda Riggs MD on 11/21/2009   Method used:   Print then Give to Patient   RxID:   [id number] DIOVAN 40 MG TABS (VALSARTAN) Take 1 tablet by mouth once a day  #30 x 1   Entered and Authorized by:   Nilda Riggs MD   Signed by:   Nilda Riggs MD on 11/21/2009   Method used:   Print then Give to Patient   RxID:   [id number]   Process Orders Check Orders Results:     [facility]: Check successful Tests Sent for requisitioning (November 21, 2009 5:07 PM):     11/21/2009: [facility] -- T-Comprehensive Metabolic Panel [[id number]] (signed)     11/22/2009: [facility] -- T-Lipid Profile [id number] (signed)    Prevention & Chronic Care Immunizations   Influenza vaccine: Fluvax MCR  (03/15/2008)    Tetanus booster: Not documented    Pneumococcal vaccine: Not documented    H. zoster vaccine: Not  documented  Colorectal Screening   Hemoccult: Not documented    Colonoscopy: Not documented   Colonoscopy action/deferral: GI referral  (11/21/2009)  Other Screening   Pap smear: Not documented    Mammogram: Assessment: BIRADS 1. Location: [facility].     (06/22/2007)   Mammogram action/deferral: Ordered  (11/21/2009)   Mammogram due: 06/2008    DXA bone density scan: Not documented  Reports requested:  Smoking status: quit  (11/21/2009)  Diabetes Mellitus   HgbA1C: 10.8  (11/21/2009)    Eye exam: Not documented   Last eye exam report requested.    Foot exam: yes  (09/27/2008)   High risk foot: no  (03/30/2008)   Foot care education: Not documented    Urine microalbumin/creatinine ratio: 109.9  (09/27/2007)    Diabetes flowsheet reviewed?: Yes   Progress toward A1C goal: Deteriorated   Diabetes comments: Dr. Mia Creek from [facility] is her eye doctor. States she has had eye appointment in last  year.  Lipids   Total Cholesterol: 180  (09/27/2008)   LDL: 114  (09/27/2008)   LDL Direct: Not documented   HDL: 41  (09/27/2008)   Triglycerides: 126  (09/27/2008)    SGOT (AST): 49  (09/27/2008)   BMP action: Ordered   SGPT (ALT): 82  (09/27/2008) CMP ordered    Alkaline phosphatase: 94  (09/27/2008)   Total bilirubin: 0.3  (09/27/2008)    Lipid flowsheet reviewed?: Yes   Progress toward LDL goal: Unchanged  Hypertension   Last Blood Pressure: 139 / 94  (11/21/2009)   Serum creatinine: 0.88  (01/25/2009)   BMP action: Ordered   Serum potassium 3.2  (01/25/2009) CMP ordered     Hypertension flowsheet reviewed?: Yes   Progress toward BP goal: Deteriorated  Self-Management Support :   Personal Goals (by the next clinic visit) :     Personal A1C goal: 7  (11/21/2009)     Personal blood pressure goal: 130/80  (11/21/2009)     Personal LDL goal: 100  (11/21/2009)    Patient will work on the following  items until the next clinic visit to reach self-care goals:     Medications and monitoring: take my medicines every day, check my blood sugar, check my blood pressure, bring all of my medications to every visit, weigh myself weekly, examine my feet every day  (11/21/2009)     Eating: drink diet soda or water instead of juice or soda, eat more vegetables, eat foods that are low in salt, eat fruit for snacks and desserts  (11/21/2009)     Activity: take a 30 minute walk every day, park at the far end of the parking lot  (11/21/2009)    Diabetes self-management support: Written self-care plan, Referred for DM self-management training  (11/21/2009)   Diabetes care plan printed    Hypertension self-management support: Written self-care plan  (11/21/2009)   Hypertension self-care plan printed.    Lipid self-management support: Written self-care plan  (11/21/2009)   Lipid self-care plan printed.   Nursing Instructions: Schedule screening mammogram (see order) GI referral for screening colonoscopy (see order) Request report of last diabetic eye exam Refer for diabetes self-management training (see order)    Laboratory Results   Blood Tests   Date/Time Received: November 21, 2009 3:59 PM Date/Time Reported: Alric Quan  November 21, 2009 3:59 PM   HGBA1C: 10.8%   (Normal Range: Non-Diabetic - 3-6%   Control Diabetic - 6-8%) CBG Random:: 257mg /dL      Diabetes Self Management Training Referral Patient Name: Lindsay Hardy Date Of Birth: Oct 21, 1950 MRN: [id number] Current Diagnosis:  SPECIAL SCREENING FOR MALIGNANT NEOPLASMS COLON (ICD-V76.51) CHEST PAIN, ATYPICAL (ICD-786.59) ARM PAIN, LEFT (ICD-729.5) FOOT PAIN, LEFT (ICD-729.5) GANGLION CYST, WRIST, RIGHT (ICD-727.41) HYPOKALEMIA (ICD-276.8) POSTMENOPAUSAL BLEEDING (ICD-627.1) FAMILY HISTORY OF ALCOHOLISM/ADDICTION (ICD-V61.41) FAMILY HISTORY DIABETES 1ST DEGREE RELATIVE (ICD-V18.0) GANGLION CYST (ICD-727.43) BLURRED VISION  (ICD-368.8) SINUSITIS (ICD-473.9) ANEMIA-NOS (ICD-285.9) CHRONIC GRANULOMATOUS DISEASE (ICD-288.1) HEMORRHOIDS, HX OF (ICD-V12.79) TRANSAMINASES, SERUM, ELEVATED (ICD-790.4) OBESITY (ICD-278.00) LEG PAIN (ICD-729.5) CHOLECYSTECTOMY, LAPAROSCOPIC, HX OF (ICD-V45.79) REACTIVE AIRWAY DISEASE (ICD-493.90) CARDIOMYOPATHY (ICD-425.4) DIASTOLIC DYSFUNCTION (ICD-429.9) DISEASE, CEREBROVASCULAR NEC (ICD-437.8) SPUR, CALCANEAL (ICD-726.73) OSTEOARTHRITIS (ICD-715.90) MYOCARDIAL INFARCTION, HX OF (ICD-412) HYPERTENSION (ICD-401.9) HYPERLIPIDEMIA (ICD-272.4) HEADACHE (ICD-784.0) GERD (ICD-530.81) DIABETES MELLITUS, TYPE II (ICD-250.00) CORONARY ARTERY DISEASE (ICD-414.00) ASTHMA (ICD-493.90)     Management Training Needs:   Follow-up DSMT(2 hours/year)  Monitoring  Complicating Conditions:

## 2010-07-10 NOTE — Progress Notes (Signed)
Summary: Prior Authorization//kg  Phone Note Other Incoming   Summary of Call: Received faxed notice that pt's Medicare will not pay for her ventolin HFA.  Pharmacist did say that he could get them to pay for Proair HFA.  Pt also has medicaid which will usually pay for ventolin, but because pt has a Medicare D plan, medicaid will not pay for any meds.  Pt uses [facility] on [address]  She was also contacted to inform her that her ventolin may be changed to [employer].  Please advise. Initial call taken by: Cynda Familia Duncan Dull),  November 27, 2009 9:55 AM  Follow-up for Phone Call        completed refill, thank you Kenise Barraco  Follow-up by: Mliss Sax MD,  November 27, 2009 10:47 AM    New/Updated Medications: PROAIR HFA 108 (90 BASE) MCG/ACT AERS (ALBUTEROL SULFATE) inhale 1-2 puffs evey 4-6 hours as needed for shortness of breath Prescriptions: PROAIR HFA 108 (90 BASE) MCG/ACT AERS (ALBUTEROL SULFATE) inhale 1-2 puffs evey 4-6 hours as needed for shortness of breath  #1 x 11   Entered and Authorized by:   Mliss Sax MD   Signed by:   Mliss Sax MD on 11/27/2009   Method used:   Electronically to        [facility] [id number] (retail)       [address]       [place], [place]  [address]       Ph: [phone]       Fax: [phone]   RxID:   [id number]   Appended Document: Prior Authorization//kg call made back to pt to inform her of med change, phone call complete.Marland Kitchen

## 2010-07-31 ENCOUNTER — Encounter: Payer: Self-pay | Admitting: Internal Medicine

## 2010-08-25 LAB — GLUCOSE, CAPILLARY: Glucose-Capillary: 257 mg/dL — ABNORMAL HIGH (ref 70–99)

## 2010-09-17 LAB — GLUCOSE, CAPILLARY: Glucose-Capillary: 163 mg/dL — ABNORMAL HIGH (ref 70–99)

## 2010-10-21 NOTE — Group Therapy Note (Signed)
NAME:  Lindsay Hardy, Lindsay Hardy               ACCOUNT NO.:  [id number]   MEDICAL RECORD NO.:  [id number]          PATIENT TYPE:  WOC   LOCATION:  [facility]                   FACILITY:  [facility]   PHYSICIAN:  Argentina Donovan, MD        DATE OF BIRTH:  June 07, 1951   DATE OF SERVICE:  06/22/2007                                  CLINIC NOTE   The patient is a 60 year old African American female gravida to para 1-0-  1-1 who had stopped having periods regularly at age 57, has not had a  Pap smear in about 8 years, but has been getting regular mammograms, and  has come in because, over the many past years has had episodes of  vaginal bleeding, especially with taking pain medications.  She never  followed up on this and comes in today with a complaint that she has  also developed some vaginal pain.   PAST HISTORY:  This young lady had a gallbladder removed in 2003.  She  had warts removed and in 1972 and carpal tunnel surgery in 1993.   She has hypertension with a blood pressure today of 154/100.  She weighs  287 pounds of 5 feet 5 inches tall.  Her breasts are large, pendulous, no dominant masses.  ABDOMEN:  Soft, flat, no masses or organomegaly.  EXTERNAL GENITALIA:  Is normal.  BUS is within normal limits.  Vagina is  clean and well rugated.  Cervix is flat against the apex of the vagina.  The uterus and adnexa could not be palpated.  The cervix was grasped  with a tenaculum after Pap smear was taken and the uterine cavity  sounded to a depth of 7 cm.  Endometrial biopsy was taken successfully.   The patient is going to have an ultrasound to evaluate the endometrial  stripe.   IMPRESSION:  Postmenopausal bleeding.  She is also scheduled for a  repeat mammogram.           ______________________________  Argentina Donovan, MD     PR/MEDQ  D:  06/22/2007  T:  06/22/2007  Job:  [id number]

## 2010-10-24 NOTE — Discharge Summary (Signed)
NAMELEONARDO, PLAIA               ACCOUNT NO.:  [id number]   MEDICAL RECORD NO.:  [id number]          PATIENT TYPE:  INP   LOCATION:  3738                         FACILITY:  [facility]   PHYSICIAN:  Hollace Hayward, M.D.   DATE OF BIRTH:  Jun 12, 1950   DATE OF ADMISSION:  03/23/2006  DATE OF DISCHARGE:  03/26/2006                                 DISCHARGE SUMMARY   DISCHARGE DIAGNOSES:  1. Atypical chest pain with negative Myoview during admission.  2. Coronary artery disease with 60% stenosis of the right RCA in August      2005.  3. Hypertension.  4. Asthma.  5. Obesity.  6. Gastroesophageal reflux disease.  7. Diabetes mellitus.  8. History of cholecystectomy in January 2003.  9. Status post tonsillectomy.  10.Status post left carpal tunnel syndrome.  11.History of osteoarthritis.   DISCHARGE MEDICATIONS:  1. Aspirin 81 mg q. day.  2. Norvasc 5 mg q. day.  3. Cozaar 100 mg q. day.  4. Hydrochlorothiazide 25 mg q. day.  5. K-Dur 20 mEq q. day.  6. Advair 250/50 1 puff q. day.  7. Albuterol q. 4 hours p.r.n.  8. Glipizide 2.5 mg q. day.  9. Nexium 40 mg q. day.  10.Zocor 20 mg q. day.  11.Zyrtec 5 mg q. day.   DISPOSITION:  She is to follow up with Dr. Okey Dupre her primary care  physician on April 15, 2006.  Cardiology followed while patient was  admitted and stated no need for further workup.   HISTORY AND PHYSICAL:  Mrs. Schlatter is a 60 year old women with a history of  60% right RCA stenosis.  She came in complaining of chest pain that was 7  out of 10 associated with some mild shortness of breath but no diaphoresis,  nausea or vomiting.  She denied any radiation of the pain.  She stated that  her pain was continuous 4 days prior to admission but there have been no  significant triggers.  She stated that antacids as well as Mylanta improved  her pain.  At time of admission her vitals were:  Temperature 98.1.  BP 149/105.  Pulse  of 98.  O2 sats 98% on room air.   Weight of 281.  She was in no acute  distress.  Regular rate and rhythm.  Negative murmur.  Please seen H&P for  further details.   HOSPITAL COURSE:  1. Atypical chest pain:  She was admitted and worked up to rule out a      acute coronary syndrome with arterial cardiac enzymes and EKG.  A      consult was obtained from cardiology.  Her initial EKG had a heart rate      of 101 and she was in sinus rhythm with very minimal Q waves and 3, AVF      and some flat T in leads B1-B2.  Initially she was placed on      nitroglycerin and heparin as well as metoprolol.  Cardiology suggested      a stress test which was obtained that showed no ischemia.  Cardiology  stated that no further cardiac work up was needed at this time.  1. Hypertension:  This was controlled with metoprolol as well as Norvasc      and hydrochlorothiazide.  She will be discharged and follow up with her      primary care physician so she can be titrated to optimal blood pressure      control.  2. Diabetes:  Was controlled with sliding scale insulin.  She was then      transitioned to glipizide when she started to take p.o.s.  3. Gastroesophageal Reflux Disease:  She is to continue Protonix.  4. She was placed on Advair and Xopenex.  5. Anxiety/depression:  Nothing was started at this time but possible      consideration for SSRIs or benzos for short duration.  6. Follow up:  The patient ruled out for any ischemic disease so no      further cardiac work up is needed at this time.  She will see Dr.      Okey Dupre in the outpatient setting to optimize her chronic medical      conditions.      Hollace Hayward, M.D.  Electronically Signed     TE/MEDQ  D:  03/26/2006  T:  03/27/2006  Job:  [id number]

## 2010-10-24 NOTE — Cardiovascular Report (Signed)
[name], Lindsay Hardy                           ACCOUNT NO.:  [id number]   MEDICAL RECORD NO.:  [id number]                   PATIENT TYPE:  INP   LOCATION:  4735                                 FACILITY:  [facility]   PHYSICIAN:  Carole Binning, M.D. [facility]         DATE OF BIRTH:  January 14, 1951   DATE OF PROCEDURE:  11/23/2003  DATE OF DISCHARGE:  11/24/2003                              CARDIAC CATHETERIZATION   PROCEDURE PERFORMED:  Left heart catheterization with coronary angiography  and left ventriculography.   INDICATION:  [name] is a 60 year old woman with cardiovascular risk  factors of hypertension, dyslipidemia and borderline diabetes.  She  presented to the hospital with chest pain.  Cardiac markers showed a mildly  elevated troponin and CK MB consistent with a small non-Q-wave myocardial  infarction.  She was thus referred for cardiac catheterization.   PROCEDURAL NOTE:  A 6 French sheath was placed in the right femoral artery.  Coronary angiography was performed with standard Judkins 6 French catheters.  Left ventriculography was performed with an angled pigtail catheter.  Contrast was Omnipaque.  There were no complications.   RESULTS:   HEMODYNAMICS:  Left ventricular pressure 166/20, aortic pressure 152/102.  There was no aortic valve gradient.   LEFT VENTRICULOGRAM:  Wall motion is normal, ejection fraction estimated at  greater than or equal to 60%.  There is no mitral regurgitation.   CORONARY ARTERIOGRAPHY (RIGHT DOMINANT):  Left main is normal.   Left anterior descending artery has diffuse luminal irregularities  throughout the proximal and mid vessel.  The LAD gives rise to three small  diagonal branches.   Left circumflex gives rise to a small first obtuse marginal branch and a  large branching second obtuse marginal.  The left circumflex is normal.   Right coronary artery has a high anterior takeoff.  It is a very tortous  vessel.  In the mid right  coronary artery there is a 60% stenosis with some  mild haziness consistent with a possible ruptured plaque.  Beyond this, the  right coronary artery gives rise to a small posterior descending artery.   IMPRESSIONS:  1. Normal left ventricular systolic function.  2. One vessel coronary artery disease characterized by a hazy 60% stenosis     in the mid right coronary artery consistent with possible ruptured     plaque.   RECOMMENDATION:  Aggressive medical therapy to include aggressive risk  factor modification.  Would also recommend use of Plavix for 9-12 months for  the patient's acute coronary syndrome, treating her cholesterol for a goal  LDL cholesterol of less than 100 and treating her blood pressure for a goal  systolic pressure of 120-130.  Carole Binning, M.D. [facility]    MWP/MEDQ  D:  11/23/2003  T:  11/25/2003  Job:  [id number]   cc:   [facility]   Jesse Sans. Wall, M.D.   Cardiac Cath Lab

## 2010-10-24 NOTE — Op Note (Signed)
[facility]  Patient:    Lindsay Hardy, Lindsay Hardy Visit Number: [id number] MRN: [id number]          Service Type: SUR Location: 1S 0001 01 Attending Physician:  Caleen Essex Dictated by:   Ollen Gross. Vernell Morgans, M.D. Proc. Date: 06/22/01 Admit Date:  06/22/2001 Discharge Date: 06/23/2001                             Operative Report  PREOPERATIVE DIAGNOSIS:  Cholelithiasis.  POSTOPERATIVE DIAGNOSIS:  Cholelithiasis.  PROCEDURE:  Laparoscopic cholecystectomy.  SURGEON:  Ollen Gross. Vernell Morgans, M.D.  ASSISTANT:  Rose Phi. Maple Hudson, M.D.  ANESTHESIA:  General endotracheal.  DESCRIPTION OF PROCEDURE:  After informed consent was obtained, the patient was brought to the operating room and placed in the supine position on the operating table.  After adequate induction of general anesthesia, the patients abdomen was prepped with Betadine, and draped in the usual sterile manner.  The area above the umbilicus was infiltrated with 0.25% Marcaine and a small transverse incision was made with the 15 blade knife.  This incision was carried down through the subcutaneous tissue using blunt dissection with the Kelly clamp and [employer] retractors until the linea alba was identified. The linea alba was incised with a 15 blade knife, and each side was grasped with the Kocher clamps and elevated anteriorly.  The preperitoneal space was probed bluntly with the hemostat until the peritoneum was opened, and access was obtained to the abdominal cavity.  A finger was inserted through this opening to palpate the abdominal wall, and there were no apparent adhesions. A 0 Vicryl pursestring stitch was placed in the fascia surrounding this opening.  Next, a Hasson cannula was placed through this opening and anchored in place with a previously placed Vicryl pursestring stitch.  The abdomen was then insufflated with carbon dioxide without difficulty.  The laparoscope was placed through the  Hasson cannula, and the liver edge and dome of the gallbladder were readily identifiable.  Next, a small upper midline transverse incision was made after infiltrating this area with 0.25% Marcaine.  A 10 mm port was then placed bluntly through this incision into the abdominal cavity under direct vision.  Sites were then chosen laterally on the right side of the abdominal wall for placement of 5 mm ports and each on these areas was infiltrated with 0.25% Marcaine, and small incisions were made with the 15 blade knife.  Five millimeter ports were then placed bluntly through these incisions into the abdominal cavity under direct vision.  A blunt grasper was placed through the lateral most 5 mm port and used to grasp the dome of the gallbladder and elevated anteriorly and superiorly.  A dissector was placed through the upper midline port, and using a combination of blunt dissection as well as dissection with the Bovie electrocautery, adhesions to the body of the gallbladder were taken down, taking care to stay away from the stomach and duodenum.  Once this was accomplished, the gallbladder neck was more easily visible. Another blunt grasper was placed through the other 5 mm port to retract on the body and neck of the gallbladder.  The Bovie electrocautery hooked to the dissector was then used to open the peritoneal reflection at the gallbladder neck area.  A blunt dissection was then carried out in this area until the gallbladder neck and cystic duct junction was readily identifiable and a good window was then created.  The cystic duct was then dissected in a circumferential manner this way, and taking care to keep the common duct medial to this dissection.  Three clips were then placed proximally on the cystic duct and one distally, and the duct was divided between the two. Posterior to this, the cystic artery was identified and again dissected in a circumferential manner.  Two clips were  placed proximally and one distally in the artery and the artery was divided between the two.  Next, the laparoscopic hook cautery was then used to separate the gallbladder from the liver bed.  Prior to completely detaching the gallbladder from the liver bed, the liver bed was inspected, and several bleeding points required Bovie electrocautery for hemostasis.  The gallbladder was then detached the rest of the way from the liver bed with the hook electrocautery.  An endoscopic bag was placed through the upper midline port, and the gallbladder was placed within the bag, and the bag was sealed.  The microscope was then removed from the upper midline port, and a gallbladder grasper was placed through the Hasson cannula, and in bag with the gallbladder and it was removed through the supraumbilical port.  The Hasson cannula was then reinserted and the abdomen was inspected and the liver bed was found to be hemostatic.  The abdomen was then irrigated with copious amounts of saline until the effluent was clear.  The ports were then all removed under direct vision and the fascial defect at the supraumbilical port was closed with a previously placed Vicryl pursestring stitch, and all were found to be hemostatic.  The skin incisions were closed with interrupted 4-0 Monocryl subcuticular stitches. Benzoin and Steri-Strips were applied.  The patient tolerated the procedure well.  At the end of the case, all needle, sponge, and instrument counts were correct.  The patient was awakened and taken to the recovery room in stable condition. Dictated by:   Ollen Gross. Vernell Morgans, M.D. Attending Physician:  Caleen Essex DD:  06/24/01 TD:  06/27/01 Job: [id number]

## 2010-10-24 NOTE — Consult Note (Signed)
Lindsay Hardy, ANSPACH               ACCOUNT NO.:  [id number]   MEDICAL RECORD NO.:  [id number]          PATIENT TYPE:  OBV   LOCATION:  3738                         FACILITY:  [facility]   PHYSICIAN:  Bimal R. Sherryll Burger, MD      DATE OF BIRTH:  12-01-1950   DATE OF CONSULTATION:  03/23/2006  DATE OF DISCHARGE:                                   CONSULTATION   PRIMARY CARE PHYSICIAN:  [name], M.D.   CHIEF COMPLAINT:  Chest pain.   HISTORY OF PRESENT ILLNESS:  This is a 60 year old woman with a history of  60% stenosis of her RCA, hypertension, diabetes, and hyperlipidemia, who  states that she had the onset of chest pain approximately 4 days ago while  she was active around the house.  At that time, she described it as a 7 out  of 10 discomfort.  She describes her mild shortness of breath, but no  diaphoresis, nausea, or vomiting.  She denies any radiating pain to the jaw.  She does state that she did have some arm pain, however, she associates this  more to trauma that she suffered to the left arm a week prior and had been  taking NSAIDS for pain relief.  She states that during the last 4 days, her  pain has been continuous and states that there have not been any significant  triggers.  She does state that she started to retake her acid reflux  medication and started to take gas medications such as Mylanta which  improved her symptoms.  She states now her symptoms at this present moment  are about a 1-2 out of 10 and are extremely mild.  She states that her arm  pain has completely resolved.   The patient states that the last time she had any significant chest pain was  in 2005 which prompted a cardiac catheterization where it was noted that she  had a 60% stenosis of an RCA and at that time medical management was opted  for.  She had normal LV function at that time as well.  She has not had any  further cardiac stress testing since that time.   PAST MEDICAL HISTORY:  1. Coronary  artery disease with a 60% stenosis of the right coronary      artery in August of 2005 that has been medically managed.  2. Hypertension.  3. Asthma.  4. Obesity.  5. Gastroesophageal reflux disease.  6. Diabetes mellitus.  7. History of cholecystectomy in January of 2003.  8. Status post tonsillectomy.  9. Status post left carpal tunnel syndrome.  10.History of osteoarthritis.   ALLERGIES:  DARVON, ACE INHIBITORS.   CURRENT INPATIENT MEDICATIONS:  1. Norvasc 5 mg daily.  2. Aspirin 325 mg daily.  3. Heparin drip.  4. Hydrochlorothiazide.  5. Insulin.  6. Losartan.  7. Protonix 40 mg daily.  8. Zocor 20 mg daily.  9. Nitroglycerin drip IV.   SOCIAL HISTORY:  She lives in [place] with a 62 year old son and two  young housemaids.  She is currently disabled.  She is a former  nursing  assistant.  She is divorced.  She denies any alcohol or drugs.  She states  that she quit smoking approximately 25 years ago, however, is around  frequent smokers.   FAMILY HISTORY:  Her mother passed away at the age of 50 of lung cancer.  Her father is alive at 84 with alcoholic cirrhosis.  She has had three  brothers who have all died from complications of diabetes and coronary  disease.  The most recent brother died approximately 2 weeks ago.   REVIEW OF SYSTEMS:  The patient endorses a mild cough that has been residual  from a URI that she had about 2 weeks prior.  She currently denies any  fever, chills, or productive cough.  She states that she does not have any  orthopnea.  She sleeps on one pillow at night on a flat bed.  She denies any  PND, lower extremity edema, or palpitations.  She states that she did have  some mild loose stools a few days prior with the use of Mylanta, but this  has resolved with the discontinuation of this medication.  The remaining 18-  point review of systems is negative.   PHYSICAL EXAMINATION:  VITAL SIGNS:  The patient is afebrile with a  temperature of  62.1, pulse 98, blood pressure 165/112, weight 122 kg, O2  saturation 97% on room air.  GENERAL:  She is obese, alert and oriented x3.  HEENT:  Normocephalic and atraumatic.  Pupils equal, round, and reactive to  light.  Oropharynx is moist and clear with some missing teeth.  Extraocular  movements are intact.  There are no carotid bruits.  Her carotid upstrokes  are 2+ and symmetric bilaterally.  Her JVP is flat.  HEART:  Regular rate and rhythm, normal S1 and S2.  No murmurs, rubs, or  gallops with a normal PMI.  LUNGS:  Clear to auscultation bilaterally without any wheezes, rhonchi, or  rales.  ABDOMEN:  Positive bowel sounds.  Truncal obesity, soft, nontender, and  nondistended.  EXTREMITIES:  2+ radial and posterior tibialis pulses, symmetric bilaterally  without any lower extremity edema, clubbing, or cyanosis.  She has 5/5 motor  strength throughout.  NEUROLOGY:  Cranial nerves III-XII are intact.   LABORATORY DATA:  A chest x-ray shows a normal cardiac silhouette without  any infiltrate, effusion, or edema.  EKG shows a sinus rhythm at rate of 101  with normal axis, normal intervals, without any Q waves or ST or T wave  changes suggestive of ischemia.  I have no prior for comparison.   White count is 4.8, hematocrit 39.2, platelets 277, potassium 3.0, bicarb  28, creatinine 0.9, glucose 170.  Total bilirubin 0.6, AST 29, ALT 48.  CK  81, MB 0.7, troponin I less than 0.1.  Coags were normal.   ASSESSMENT:  1. Atypical chest pain, most likely represent acid reflux versus gas,      however, chronic stable angina could not be ruled out at this time.  2. History of coronary artery disease with a 60% right coronary artery      lesion in 2005.  3. Diabetes mellitus with a last hemoglobin A1C of 6.9% per the patient's      records.  4. Hyperlipidemia.  5. Hypertension.   RECOMMENDATIONS:  I agree with the current management.  I would rule the patient out for an acute MI by  cardiac markers x3.  However, her symptoms  are most likely consistent with atypical  chest pain related to acid reflux  or gas.  However, given her risk factors and prior history, I think it is  prudent to rule her out.  Additionally, if she does rule out, the  appropriate next step would be a functional study to assess to see if she  does have any ischemia that could be contributing to her symptoms.  I would  continue her on her secondary prevention medications.  Her goal hemoglobin  A1C is less than 7, her goal LDL should be less than 70.  If not done  already, I would check a hemoglobin A1C and a lipid panel during this  admission.  She does need better blood pressure control as evidenced by her  elevated blood pressure when I examined her.  She should also have her  potassium replaced given that she is probably wasting potassium as the  result of her thiazide diuretic.   We will come by and evaluate the patient again in the morning and if she is  ruled out, we will evaluate her for an outpatient functional study.  Please  feel free to call us with any further questions.  Thank you very much.           ______________________________  Eston Esters. Sherryll Burger, MD     BRS/MEDQ  D:  03/23/2006  T:  03/24/2006  Job:  [id number]

## 2012-03-21 ENCOUNTER — Other Ambulatory Visit (HOSPITAL_COMMUNITY): Payer: Self-pay | Admitting: Internal Medicine

## 2012-03-21 DIAGNOSIS — Z1231 Encounter for screening mammogram for malignant neoplasm of breast: Secondary | ICD-10-CM

## 2012-03-24 ENCOUNTER — Ambulatory Visit (HOSPITAL_COMMUNITY)
Admission: RE | Admit: 2012-03-24 | Discharge: 2012-03-24 | Disposition: A | Discharge status: Home / Self Care | Payer: Medicare Other | Source: Ambulatory Visit | Attending: Internal Medicine | Admitting: Internal Medicine

## 2012-03-24 DIAGNOSIS — Z1231 Encounter for screening mammogram for malignant neoplasm of breast: Secondary | ICD-10-CM | POA: Insufficient documentation

## 2012-12-15 ENCOUNTER — Other Ambulatory Visit: Payer: Self-pay

## 2013-03-31 ENCOUNTER — Encounter (HOSPITAL_COMMUNITY): Payer: Self-pay | Admitting: Emergency Medicine

## 2013-03-31 ENCOUNTER — Emergency Department (INDEPENDENT_AMBULATORY_CARE_PROVIDER_SITE_OTHER)
Admission: EM | Admit: 2013-03-31 | Discharge: 2013-03-31 | Disposition: A | Discharge status: Home / Self Care | Payer: Medicare Other | Source: Home / Self Care | Attending: Family Medicine | Admitting: Family Medicine

## 2013-03-31 DIAGNOSIS — M65311 Trigger thumb, right thumb: Secondary | ICD-10-CM

## 2013-03-31 DIAGNOSIS — E119 Type 2 diabetes mellitus without complications: Secondary | ICD-10-CM

## 2013-03-31 DIAGNOSIS — M6530 Trigger finger, unspecified finger: Secondary | ICD-10-CM

## 2013-03-31 MED ORDER — METHYLPREDNISOLONE ACETATE 40 MG/ML IJ SUSP
INTRAMUSCULAR | Status: AC
  Filled 2013-03-31: qty 5

## 2013-03-31 MED ORDER — METHYLPREDNISOLONE ACETATE 40 MG/ML IJ SUSP: 10.0000 mg | Freq: Once | INTRAMUSCULAR | Status: DC

## 2013-03-31 MED ORDER — TRAMADOL HCL 50 MG PO TABS: 50.0000 mg | ORAL_TABLET | Freq: Four times a day (QID) | ORAL | Status: DC | PRN

## 2013-03-31 NOTE — ED Notes (Signed)
C/o pain R thumb for 2 weeks.  No known injury.  States when she bends it, it gets stuck and she has to push it back.  States it popping in her joint.

## 2013-03-31 NOTE — ED Provider Notes (Signed)
Lindsay Hardy is a 62 y.o. female who presents to Urgent Care today for right trigger thumb present for about 2 weeks. Patient has pain at the palm are MCP worse with flexion of the interphalangeal joint. She denies any injury radiating pain weakness or numbness. She's not tried any medications yet. She notes popping and getting stuck. She has to forcefully extend her interphalangeal joint sometimes.    Past Medical History  Diagnosis Date  . CAD (coronary artery disease) 11/2003     RCA 60% stenosis, LAD diffuse  . CVA (cerebrovascular accident) 2001     right frontoparietal cortical CVA - MRI September 2001  . Asthma   . Diabetes mellitus   . GERD (gastroesophageal reflux disease)      EGD June 2004 -  followed by Dr. Leone Payor  . Hyperlipidemia   . Hypertension   . OA (osteoarthritis)      DJP coracoclavicular ligament, left patella osteophytes June 2002, L4-S1 spondylosis, degenerative disc disease with bulge. I in October 2001, the C2-C4 spondylosis without stenosis and with spurs based on x-ray July 2005; diffuse idiopathic skeletal hyperostosis with bilateral hip lumbar degenerative changes   on x-ray February 2005, bilateral plantar calcaneal spurs  . Diastolic dysfunction      ejection fraction 55-65% on 2-D echo May 2006  . Empty sella syndrome      based on MRI September 2001, related to obesity, hypertension, it was determined that patient has not required treatment but will be monitored with TSH, ACTH, cortisol, testosterone, prolactin, growth hormone  . NAFLD (nonalcoholic fatty liver disease)      based on ultrasound June 2002, elevated transaminase  . Reactive airway disease      peak flow 180 in May 2005, chronic sinusitis with PND, bronchitis December 2002 and strep pneumonia April 2007,  FEC 66, MCV 60, ratio is 69, DLCO 57,  moderate restrictive and mild obstructive disease  on spirometry May 2005  . Granulomatous disease, chronic      in right upper lobe per x-ray  December 2005   History  Substance Use Topics  . Smoking status: Former Games developer  . Smokeless tobacco: Not on file  . Alcohol Use: No   ROS as above Medications reviewed. Current Facility-Administered Medications  Medication Dose Route Frequency Provider Last Rate Last Dose  . methylPREDNISolone acetate (DEPO-MEDROL) injection 10 mg  10 mg Intra-articular Once Rodolph Bong, MD       Current Outpatient Prescriptions  Medication Sig Dispense Refill  . albuterol (PROAIR HFA) 108 (90 BASE) MCG/ACT inhaler Inhale 2 puffs into the lungs every 4 (four) hours as needed.        Marland Kitchen amLODipine (NORVASC) 10 MG tablet Take 10 mg by mouth daily.        Marland Kitchen glipiZIDE (GLUCOTROL) 5 MG tablet Take 5 mg by mouth 2 (two) times daily before a meal.        . hydrochlorothiazide 25 MG tablet Take 25 mg by mouth daily.        . fluticasone (FLONASE) 50 MCG/ACT nasal spray 1 spray by Nasal route daily.        . metFORMIN (GLUCOPHAGE) 1000 MG tablet Take 1,000 mg by mouth 2 (two) times daily with a meal.        . omeprazole (PRILOSEC) 20 MG capsule Take 20 mg by mouth 2 (two) times daily.        . traMADol (ULTRAM) 50 MG tablet Take 1 tablet (50 mg total) by  mouth every 6 (six) hours as needed for pain.  25 tablet  0  . valsartan (DIOVAN) 40 MG tablet Take 40 mg by mouth daily.         Exam:  BP 147/99  Pulse 94  Temp(Src) 98.3 F (36.8 C) (Oral)  Resp 16  SpO2 100% Gen: Well NAD RIGHT: Normal-appearing. Tender to palpation, or MCP. Palpable triggering present.  Limited musculoskeletal ultrasound of the right thumb. Flexor tendon noted at MCP. Visible triggering present. Bony structures otherwise unremarkable.  Results for orders placed during the hospital encounter of 03/31/13 (from the past 24 hour(s))  GLUCOSE, CAPILLARY     Status: Abnormal   Collection Time    03/31/13  5:58 PM      Result Value Range   Glucose-Capillary 321 (*) 70 - 99 mg/dL   No results found.  Assessment and Plan: 62  y.o. female with trigger thumb. Plan cortisone injection. However patient's blood sugar is over 300. Declined to do an injection at that time. Plan for double Band-Aid  splint and followup with hand surgery next week for consideration of surgical release.  Tramadol for pain Discussed warning signs or symptoms. Please see discharge instructions. Patient expresses understanding.       Rodolph Bong, MD 03/31/13 [phone]

## 2013-09-07 ENCOUNTER — Emergency Department (INDEPENDENT_AMBULATORY_CARE_PROVIDER_SITE_OTHER)
Admission: EM | Admit: 2013-09-07 | Discharge: 2013-09-07 | Disposition: A | Discharge status: Home / Self Care | Payer: Medicare Other | Source: Home / Self Care

## 2013-09-07 ENCOUNTER — Emergency Department (INDEPENDENT_AMBULATORY_CARE_PROVIDER_SITE_OTHER): Payer: Medicare Other

## 2013-09-07 ENCOUNTER — Encounter (HOSPITAL_COMMUNITY): Payer: Self-pay | Admitting: Emergency Medicine

## 2013-09-07 DIAGNOSIS — R143 Flatulence: Secondary | ICD-10-CM

## 2013-09-07 DIAGNOSIS — R141 Gas pain: Secondary | ICD-10-CM

## 2013-09-07 DIAGNOSIS — R142 Eructation: Secondary | ICD-10-CM

## 2013-09-07 DIAGNOSIS — K5900 Constipation, unspecified: Secondary | ICD-10-CM

## 2013-09-07 DIAGNOSIS — R109 Unspecified abdominal pain: Secondary | ICD-10-CM

## 2013-09-07 NOTE — Discharge Instructions (Signed)
Constipation, Adult Miralax with plenty of water If needed may try Ducolax  For Women Fiber Content in Foods Drinking plenty of fluids and consuming foods high in fiber can help with constipation. See the list below for the fiber content of some common foods. Starches and Grains / Dietary Fiber (g)  Cheerios, 1 cup / 3 g  Kellogg's Corn Flakes, 1 cup / 0.7 g  Rice Krispies, 1  cup / 0.3 g  Quaker Oat Life Cereal,  cup / 2.1 g  Oatmeal, instant (cooked),  cup / 2 g  Kellogg's Frosted Mini Wheats, 1 cup / 5.1 g  Rice, brown, long-grain (cooked), 1 cup / 3.5 g  Rice, white, long-grain (cooked), 1 cup / 0.6 g  Macaroni, cooked, enriched, 1 cup / 2.5 g Legumes / Dietary Fiber (g)  Beans, baked, canned, plain or vegetarian,  cup / 5.2 g  Beans, kidney, canned,  cup / 6.8 g  Beans, pinto, dried (cooked),  cup / 7.7 g  Beans, pinto, canned,  cup / 5.5 g Breads and Crackers / Dietary Fiber (g)  Graham crackers, plain or honey, 2 squares / 0.7 g  Saltine crackers, 3 squares / 0.3 g  Pretzels, plain, salted, 10 pieces / 1.8 g  Bread, whole-wheat, 1 slice / 1.9 g  Bread, white, 1 slice / 0.7 g  Bread, raisin, 1 slice / 1.2 g  Bagel, plain, 3 oz / 2 g  Tortilla, flour, 1 oz / 0.9 g  Tortilla, corn, 1 small / 1.5 g  Bun, hamburger or hotdog, 1 small / 0.9 g Fruits / Dietary Fiber (g)  Apple, raw with skin, 1 medium / 4.4 g  Applesauce, sweetened,  cup / 1.5 g  Banana,  medium / 1.5 g  Grapes, 10 grapes / 0.4 g  Orange, 1 small / 2.3 g  Raisin, 1.5 oz / 1.6 g  Melon, 1 cup / 1.4 g Vegetables / Dietary Fiber (g)  Green beans, canned,  cup / 1.3 g  Carrots (cooked),  cup / 2.3 g  Broccoli (cooked),  cup / 2.8 g  Peas, frozen (cooked),  cup / 4.4 g  Potatoes, mashed,  cup / 1.6 g  Lettuce, 1 cup / 0.5 g  Corn, canned,  cup / 1.6 g  Tomato,  cup / 1.1 g Document Released: 10/11/2006 Document Revised: 08/17/2011 Document Reviewed:  12/06/2006 [employer] Patient Information 2014 [employer] [place].  Abdominal Pain, Women Abdominal (stomach, pelvic, or belly) pain can be caused by many things. It is important to tell your doctor:  The location of the pain.  Does it come and go or is it present all the time?  Are there things that start the pain (eating certain foods, exercise)?  Are there other symptoms associated with the pain (fever, nausea, vomiting, diarrhea)? All of this is helpful to know when trying to find the cause of the pain. CAUSES   Stomach: virus or bacteria infection, or ulcer.  Intestine: appendicitis (inflamed appendix), regional ileitis (Crohn's disease), ulcerative colitis (inflamed colon), irritable bowel syndrome, diverticulitis (inflamed diverticulum of the colon), or cancer of the stomach or intestine.  Gallbladder disease or stones in the gallbladder.  Kidney disease, kidney stones, or infection.  Pancreas infection or cancer.  Fibromyalgia (pain disorder).  Diseases of the female organs:  Uterus: fibroid (non-cancerous) tumors or infection.  Fallopian tubes: infection or tubal pregnancy.  Ovary: cysts or tumors.  Pelvic adhesions (scar tissue).  Endometriosis (uterus lining tissue growing in the  pelvis and on the pelvic organs).  Pelvic congestion syndrome (female organs filling up with blood just before the menstrual period).  Pain with the menstrual period.  Pain with ovulation (producing an egg).  Pain with an IUD (intrauterine device, birth control) in the uterus.  Cancer of the female organs.  Functional pain (pain not caused by a disease, may improve without treatment).  Psychological pain.  Depression. DIAGNOSIS  Your doctor will decide the seriousness of your pain by doing an examination.  Blood tests.  X-rays.  Ultrasound.  CT scan (computed tomography, special type of X-ray).  MRI (magnetic resonance imaging).  Cultures, for infection.  Barium  enema (dye inserted in the large intestine, to better view it with X-rays).  Colonoscopy (looking in intestine with a lighted tube).  Laparoscopy (minor surgery, looking in abdomen with a lighted tube).  Major abdominal exploratory surgery (looking in abdomen with a large incision). TREATMENT  The treatment will depend on the cause of the pain.   Many cases can be observed and treated at home.  Over-the-counter medicines recommended by your caregiver.  Prescription medicine.  Antibiotics, for infection.  Birth control pills, for painful periods or for ovulation pain.  Hormone treatment, for endometriosis.  Nerve blocking injections.  Physical therapy.  Antidepressants.  Counseling with a psychologist or psychiatrist.  Minor or major surgery. HOME CARE INSTRUCTIONS   Do not take laxatives, unless directed by your caregiver.  Take over-the-counter pain medicine only if ordered by your caregiver. Do not take aspirin because it can cause an upset stomach or bleeding.  Try a clear liquid diet (broth or water) as ordered by your caregiver. Slowly move to a bland diet, as tolerated, if the pain is related to the stomach or intestine.  Have a thermometer and take your temperature several times a day, and record it.  Bed rest and sleep, if it helps the pain.  Avoid sexual intercourse, if it causes pain.  Avoid stressful situations.  Keep your follow-up appointments and tests, as your caregiver orders.  If the pain does not go away with medicine or surgery, you may try:  Acupuncture.  Relaxation exercises (yoga, meditation).  Group therapy.  Counseling. SEEK MEDICAL CARE IF:   You notice certain foods cause stomach pain.  Your home care treatment is not helping your pain.  You need stronger pain medicine.  You want your IUD removed.  You feel faint or lightheaded.  You develop nausea and vomiting.  You develop a rash.  You are having side effects or an  allergy to your medicine. SEEK IMMEDIATE MEDICAL CARE IF:   Your pain does not go away or gets worse.  You have a fever.  Your pain is felt only in portions of the abdomen. The right side could possibly be appendicitis. The left lower portion of the abdomen could be colitis or diverticulitis.  You are passing blood in your stools (bright red or black tarry stools, with or without vomiting).  You have blood in your urine.  You develop chills, with or without a fever.  You pass out. MAKE SURE YOU:   Understand these instructions.  Will watch your condition.  Will get help right away if you are not doing well or get worse. Document Released: 03/22/2007 Document Revised: 08/17/2011 Document Reviewed: 04/11/2009 [facility] Patient Information 2014 [place], [place].  Constipation is when a person has fewer than 3 bowel movements a week; has difficulty having a bowel movement; or has stools that are dry,  hard, or larger than normal. As people grow older, constipation is more common. If you try to fix constipation with medicines that make you have a bowel movement (laxatives), the problem may get worse. Long-term laxative use may cause the muscles of the colon to become weak. A low-fiber diet, not taking in enough fluids, and taking certain medicines may make constipation worse. CAUSES   Certain medicines, such as antidepressants, pain medicine, iron supplements, antacids, and water pills.   Certain diseases, such as diabetes, irritable bowel syndrome (IBS), thyroid disease, or depression.   Not drinking enough water.   Not eating enough fiber-rich foods.   Stress or travel.  Lack of physical activity or exercise.  Not going to the restroom when there is the urge to have a bowel movement.  Ignoring the urge to have a bowel movement.  Using laxatives too much. SYMPTOMS   Having fewer than 3 bowel movements a week.   Straining to have a bowel movement.   Having hard, dry,  or larger than normal stools.   Feeling full or bloated.   Pain in the lower abdomen.  Not feeling relief after having a bowel movement. DIAGNOSIS  Your caregiver will take a medical history and perform a physical exam. Further testing may be done for severe constipation. Some tests may include:   A barium enema X-ray to examine your rectum, colon, and sometimes, your small intestine.  A sigmoidoscopy to examine your lower colon.  A colonoscopy to examine your entire colon. TREATMENT  Treatment will depend on the severity of your constipation and what is causing it. Some dietary treatments include drinking more fluids and eating more fiber-rich foods. Lifestyle treatments may include regular exercise. If these diet and lifestyle recommendations do not help, your caregiver may recommend taking over-the-counter laxative medicines to help you have bowel movements. Prescription medicines may be prescribed if over-the-counter medicines do not work.  HOME CARE INSTRUCTIONS   Increase dietary fiber in your diet, such as fruits, vegetables, whole grains, and beans. Limit high-fat and processed sugars in your diet, such as [place] fries, hamburgers, cookies, candies, and soda.   A fiber supplement may be added to your diet if you cannot get enough fiber from foods.   Drink enough fluids to keep your urine clear or pale yellow.   Exercise regularly or as directed by your caregiver.   Go to the restroom when you have the urge to go. Do not hold it.  Only take medicines as directed by your caregiver. Do not take other medicines for constipation without talking to your caregiver first. SEEK IMMEDIATE MEDICAL CARE IF:   You have bright red blood in your stool.   Your constipation lasts for more than 4 days or gets worse.   You have abdominal or rectal pain.   You have thin, pencil-like stools.  You have unexplained weight loss. MAKE SURE YOU:   Understand these  instructions.  Will watch your condition.  Will get help right away if you are not doing well or get worse. Document Released: 02/21/2004 Document Revised: 08/17/2011 Document Reviewed: 03/06/2013 [facility] Patient Information 2014 [place], [place].

## 2013-09-07 NOTE — ED Notes (Signed)
Right low back pain, radiates to the front of torso, no nausea, no vomiting, last bm was Monday and needed laxatives for the bm on Monday.  Patient reports a 4 week history of pcp starting her on oral meds for diabetes and having to change meds every few weeks due to inability to tolerate medicine.  , last Thursday stopped [place] and started [employer].

## 2013-09-07 NOTE — ED Provider Notes (Signed)
CSN: [id number]     Arrival date & time 09/07/13  1111 History   First MD Initiated Contact with Patient 09/07/13 1209     Chief Complaint  Patient presents with  . Back Pain  . Abdominal Pain   (Consider location/radiation/quality/duration/timing/severity/associated sxs/prior Treatment) HPI Comments: 63 year old morbidly obese female with a rather complex and complicated history that includes but not limited to hypertension, type 2 diabetes mellitus, CAD, CVA, asthma, and allergies to multiple medications. Her PCP has tried various medications to manage her type 2 diabetes that has had problems in controlling her blood sugar in part due to side effects to nearly all of her medicines. Her complaint today is that of abdominal pain for 3 weeks. Pain originates in the epigastrium and to the left hemiabdomen and primarily to the right hemiabdomen radiating to the right lower back. Every time I take my insulin it makes my side hurt. I need another medicine for my diabetes. She has a history of constipation and has not had a bowel movement in 3-5 days.   Past Medical History  Diagnosis Date  . CAD (coronary artery disease) 11/2003     RCA 60% stenosis, LAD diffuse  . CVA (cerebrovascular accident) 2001     right frontoparietal cortical CVA - MRI September 2001  . Asthma   . Diabetes mellitus   . GERD (gastroesophageal reflux disease)      EGD June 2004 -  followed by Dr. Leone PayorGessner  . Hyperlipidemia   . Hypertension   . OA (osteoarthritis)      DJP coracoclavicular ligament, left patella osteophytes June 2002, L4-S1 spondylosis, degenerative disc disease with bulge. I in October 2001, the C2-C4 spondylosis without stenosis and with spurs based on x-ray July 2005; diffuse idiopathic skeletal hyperostosis with bilateral hip lumbar degenerative changes   on x-ray February 2005, bilateral plantar calcaneal spurs  . Diastolic dysfunction      ejection fraction 55-65% on 2-D echo May 2006  . Empty  sella syndrome      based on MRI September 2001, related to obesity, hypertension, it was determined that patient has not required treatment but will be monitored with TSH, ACTH, cortisol, testosterone, prolactin, growth hormone  . NAFLD (nonalcoholic fatty liver disease)      based on ultrasound June 2002, elevated transaminase  . Reactive airway disease      peak flow 180 in May 2005, chronic sinusitis with PND, bronchitis December 2002 and strep pneumonia April 2007,  FEC 66, MCV 60, ratio is 69, DLCO 57,  moderate restrictive and mild obstructive disease  on spirometry May 2005  . Granulomatous disease, chronic      in right upper lobe per x-ray December 2005   Past Surgical History  Procedure Laterality Date  . Cholecystectomy  2003  . Carpal tunnel release  1992     left arm 1992  . Tonsillectomy  ?[id number]   Family History  Problem Relation Age of Onset  . Lung cancer Mother    History  Substance Use Topics  . Smoking status: Former Games developermoker  . Smokeless tobacco: Not on file  . Alcohol Use: No   OB History   Grav Para Term Preterm Abortions TAB SAB Ect Mult Living                 Review of Systems  Constitutional: Positive for appetite change. Negative for fever and activity change.  HENT: Negative for congestion, postnasal drip and sore throat.   Respiratory: Positive  for shortness of breath. Negative for cough and wheezing.   Cardiovascular: Negative for chest pain and leg swelling.  Gastrointestinal: Positive for abdominal pain and constipation. Negative for vomiting, diarrhea, blood in stool and rectal pain.  Genitourinary: Negative for dysuria, vaginal discharge and pelvic pain.  Musculoskeletal:       Various areas of aches, pains and numbness.  Skin: Negative.   Neurological: Positive for light-headedness and numbness. Negative for seizures, syncope and speech difficulty.    Allergies  Aspirin-dipyridamole er; Atorvastatin; Clopidogrel bisulfate; Fexofenadine;  Fluticasone propionate; Furosemide; Glipizide; Hydrochlorothiazide w-triamterene; Lisinopril; Metformin; Metoprolol tartrate; Mometasone furoate; and Omeprazole  Home Medications   Current Outpatient Rx  Name  Route  Sig  Dispense  Refill  . bisacodyl (BISACODYL) 5 MG EC tablet   Oral   Take 5 mg by mouth daily as needed for moderate constipation.         Marland Kitchen LORATADINE PO   Oral   Take by mouth.         . VALSARTAN-HYDROCHLOROTHIAZIDE PO   Oral   Take by mouth.         Marland Kitchen albuterol (PROAIR HFA) 108 (90 BASE) MCG/ACT inhaler   Inhalation   Inhale 2 puffs into the lungs every 4 (four) hours as needed.           Marland Kitchen amLODipine (NORVASC) 10 MG tablet   Oral   Take 10 mg by mouth daily.           . fluticasone (FLONASE) 50 MCG/ACT nasal spray   Nasal   1 spray by Nasal route daily.           Marland Kitchen glipiZIDE (GLUCOTROL) 5 MG tablet   Oral   Take 5 mg by mouth 2 (two) times daily before a meal.           . hydrochlorothiazide 25 MG tablet   Oral   Take 25 mg by mouth daily.           . metFORMIN (GLUCOPHAGE) 1000 MG tablet   Oral   Take 1,000 mg by mouth 2 (two) times daily with a meal.           . omeprazole (PRILOSEC) 20 MG capsule   Oral   Take 20 mg by mouth 2 (two) times daily.           . traMADol (ULTRAM) 50 MG tablet   Oral   Take 1 tablet (50 mg total) by mouth every 6 (six) hours as needed for pain.   25 tablet   0   . valsartan (DIOVAN) 40 MG tablet   Oral   Take 40 mg by mouth daily.           BP 162/105  Pulse 104  Temp(Src) 98.7 F (37.1 C) (Oral)  Resp 20  SpO2 97% Physical Exam  Nursing note and vitals reviewed. Constitutional: She is oriented to person, place, and time. No distress.  Morbidly obese  Eyes: Conjunctivae and EOM are normal.  Neck: Normal range of motion.  Cardiovascular: Normal rate, regular rhythm and normal heart sounds.   Pulmonary/Chest: Effort normal and breath sounds normal. No respiratory distress.   Abdominal: Soft. Bowel sounds are normal. There is no rebound and no guarding.  Mild tenderness to epigastrium , upper abdomen, R lateral abdomen. No RLQ tenderness.  Percusses dull most areas  Musculoskeletal: She exhibits no edema.  Lymphadenopathy:    She has no cervical adenopathy.  Neurological: She is alert  and oriented to person, place, and time.  Skin: Skin is warm and dry.    ED Course  Procedures (including critical care time) Labs Review Labs Reviewed - No data to display Imaging Review Dg Abd 1 View  09/07/2013   CLINICAL DATA:  Abdominal pain and back pain  EXAM: ABDOMEN - 1 VIEW  COMPARISON:  None.  FINDINGS: Surgical clips in the gallbladder region. Negative for bowel obstruction. Retained stool in the right and transverse colon. No renal calculi. Phleboliths in the pelvis.  Degenerative changes in the lumbar spine especially in the facet joints at L4-5 and L5-S1. Mild degenerative change in the hip joints.  IMPRESSION: Mild constipation.  Negative for bowel obstruction.   Electronically Signed   By: Marlan Palau M.D.   On: 09/07/2013 13:18     MDM   1. Abdominal pain   2. Abdominal gas pain   3. Constipation     Miralax and plety of water Ducolax if needed. F/U with PCP next week.    Hayden Rasmussen, NP 09/07/13 1323

## 2013-09-07 NOTE — ED Provider Notes (Signed)
Medical screening examination/treatment/procedure(s) were performed by non-physician practitioner and as supervising physician I was immediately available for consultation/collaboration.  Leslee Homeavid Benelli Winther, M.D.  Reuben Likesavid C Wissam Resor, MD 09/07/13 1900

## 2013-09-11 ENCOUNTER — Emergency Department (INDEPENDENT_AMBULATORY_CARE_PROVIDER_SITE_OTHER)
Admission: EM | Admit: 2013-09-11 | Discharge: 2013-09-11 | Disposition: A | Discharge status: Home / Self Care | Payer: Medicare Other | Source: Home / Self Care | Attending: Emergency Medicine | Admitting: Emergency Medicine

## 2013-09-11 ENCOUNTER — Encounter (HOSPITAL_COMMUNITY): Payer: Self-pay | Admitting: Emergency Medicine

## 2013-09-11 DIAGNOSIS — K2990 Gastroduodenitis, unspecified, without bleeding: Secondary | ICD-10-CM

## 2013-09-11 DIAGNOSIS — R109 Unspecified abdominal pain: Secondary | ICD-10-CM

## 2013-09-11 DIAGNOSIS — R141 Gas pain: Secondary | ICD-10-CM

## 2013-09-11 DIAGNOSIS — K2970 Gastritis, unspecified, without bleeding: Secondary | ICD-10-CM

## 2013-09-11 DIAGNOSIS — R143 Flatulence: Secondary | ICD-10-CM

## 2013-09-11 DIAGNOSIS — R142 Eructation: Secondary | ICD-10-CM

## 2013-09-11 LAB — CBC WITH DIFFERENTIAL/PLATELET
BASOS PCT: 0 % (ref 0–1)
Basophils Absolute: 0 10*3/uL (ref 0.0–0.1)
EOS ABS: 0 10*3/uL (ref 0.0–0.7)
Eosinophils Relative: 0 % (ref 0–5)
HCT: 42 % (ref 36.0–46.0)
HEMOGLOBIN: 14.4 g/dL (ref 12.0–15.0)
LYMPHS ABS: 2.1 10*3/uL (ref 0.7–4.0)
Lymphocytes Relative: 29 % (ref 12–46)
MCH: 26.8 pg (ref 26.0–34.0)
MCHC: 34.3 g/dL (ref 30.0–36.0)
MCV: 78.1 fL (ref 78.0–100.0)
MONOS PCT: 11 % (ref 3–12)
Monocytes Absolute: 0.8 10*3/uL (ref 0.1–1.0)
Neutro Abs: 4.2 10*3/uL (ref 1.7–7.7)
Neutrophils Relative %: 60 % (ref 43–77)
PLATELETS: 303 10*3/uL (ref 150–400)
RBC: 5.38 MIL/uL — ABNORMAL HIGH (ref 3.87–5.11)
RDW: 13.5 % (ref 11.5–15.5)
WBC: 7.1 10*3/uL (ref 4.0–10.5)

## 2013-09-11 LAB — AMYLASE: AMYLASE: 29 U/L (ref 0–105)

## 2013-09-11 LAB — COMPREHENSIVE METABOLIC PANEL
ALT: 33 U/L (ref 0–35)
AST: 20 U/L (ref 0–37)
Albumin: 3.6 g/dL (ref 3.5–5.2)
Alkaline Phosphatase: 78 U/L (ref 39–117)
BUN: 10 mg/dL (ref 6–23)
CO2: 30 mEq/L (ref 19–32)
Calcium: 9.7 mg/dL (ref 8.4–10.5)
Chloride: 93 mEq/L — ABNORMAL LOW (ref 96–112)
Creatinine, Ser: 0.74 mg/dL (ref 0.50–1.10)
GFR calc non Af Amer: 88 mL/min — ABNORMAL LOW (ref >90)
GLUCOSE: 181 mg/dL — AB (ref 70–99)
Potassium: 3.2 mEq/L — ABNORMAL LOW (ref 3.7–5.3)
Sodium: 139 mEq/L (ref 137–147)
Total Bilirubin: 0.6 mg/dL (ref 0.3–1.2)
Total Protein: 7.6 g/dL (ref 6.0–8.3)

## 2013-09-11 LAB — POCT URINALYSIS DIP (DEVICE)
BILIRUBIN URINE: NEGATIVE
Glucose, UA: NEGATIVE mg/dL
HGB URINE DIPSTICK: NEGATIVE
Ketones, ur: NEGATIVE mg/dL
Leukocytes, UA: NEGATIVE
NITRITE: NEGATIVE
PH: 5.5 (ref 5.0–8.0)
PROTEIN: NEGATIVE mg/dL
Specific Gravity, Urine: 1.025 (ref 1.005–1.030)
Urobilinogen, UA: 0.2 mg/dL (ref 0.0–1.0)

## 2013-09-11 LAB — GLUCOSE, CAPILLARY: Glucose-Capillary: 159 mg/dL — ABNORMAL HIGH (ref 70–99)

## 2013-09-11 LAB — LIPASE, BLOOD: LIPASE: 30 U/L (ref 11–59)

## 2013-09-11 MED ORDER — SIMETHICONE 180 MG PO CAPS: ORAL_CAPSULE | ORAL | Status: DC

## 2013-09-11 MED ORDER — SUCRALFATE 1 GM/10ML PO SUSP: 1.0000 g | Freq: Three times a day (TID) | ORAL | Status: DC

## 2013-09-11 MED ORDER — GI COCKTAIL ~~LOC~~
30.0000 mL | Freq: Once | ORAL | Status: AC
  Administered 2013-09-11: 30 mL via ORAL

## 2013-09-11 MED ORDER — GI COCKTAIL ~~LOC~~
ORAL | Status: AC
  Filled 2013-09-11: qty 30

## 2013-09-11 MED ORDER — RANITIDINE HCL 150 MG PO CAPS: 150.0000 mg | ORAL_CAPSULE | Freq: Two times a day (BID) | ORAL | Status: DC

## 2013-09-11 NOTE — ED Provider Notes (Signed)
Medical screening examination/treatment/procedure(s) were performed by non-physician practitioner and as supervising physician I was immediately available for consultation/collaboration.  Leslee Homeavid Clayburn Weekly, M.D.  Reuben Likesavid C Tonda Wiederhold, MD 09/11/13 1600

## 2013-09-11 NOTE — Discharge Instructions (Signed)
Abdominal Pain, Women °Abdominal (stomach, pelvic, or belly) pain can be caused by many things. It is important to tell your doctor: °· The location of the pain. °· Does it come and go or is it present all the time? °· Are there things that start the pain (eating certain foods, exercise)? °· Are there other symptoms associated with the pain (fever, nausea, vomiting, diarrhea)? °All of this is helpful to know when trying to find the cause of the pain. °CAUSES  °· Stomach: virus or bacteria infection, or ulcer. °· Intestine: appendicitis (inflamed appendix), regional ileitis (Crohn's disease), ulcerative colitis (inflamed colon), irritable bowel syndrome, diverticulitis (inflamed diverticulum of the colon), or cancer of the stomach or intestine. °· Gallbladder disease or stones in the gallbladder. °· Kidney disease, kidney stones, or infection. °· Pancreas infection or cancer. °· Fibromyalgia (pain disorder). °· Diseases of the female organs: °· Uterus: fibroid (non-cancerous) tumors or infection. °· Fallopian tubes: infection or tubal pregnancy. °· Ovary: cysts or tumors. °· Pelvic adhesions (scar tissue). °· Endometriosis (uterus lining tissue growing in the pelvis and on the pelvic organs). °· Pelvic congestion syndrome (female organs filling up with blood just before the menstrual period). °· Pain with the menstrual period. °· Pain with ovulation (producing an egg). °· Pain with an IUD (intrauterine device, birth control) in the uterus. °· Cancer of the female organs. °· Functional pain (pain not caused by a disease, may improve without treatment). °· Psychological pain. °· Depression. °DIAGNOSIS  °Your doctor will decide the seriousness of your pain by doing an examination. °· Blood tests. °· X-rays. °· Ultrasound. °· CT scan (computed tomography, special type of X-ray). °· MRI (magnetic resonance imaging). °· Cultures, for infection. °· Barium enema (dye inserted in the large intestine, to better view it with  X-rays). °· Colonoscopy (looking in intestine with a lighted tube). °· Laparoscopy (minor surgery, looking in abdomen with a lighted tube). °· Major abdominal exploratory surgery (looking in abdomen with a large incision). °TREATMENT  °The treatment will depend on the cause of the pain.  °· Many cases can be observed and treated at home. °· Over-the-counter medicines recommended by your caregiver. °· Prescription medicine. °· Antibiotics, for infection. °· Birth control pills, for painful periods or for ovulation pain. °· Hormone treatment, for endometriosis. °· Nerve blocking injections. °· Physical therapy. °· Antidepressants. °· Counseling with a psychologist or psychiatrist. °· Minor or major surgery. °HOME CARE INSTRUCTIONS  °· Do not take laxatives, unless directed by your caregiver. °· Take over-the-counter pain medicine only if ordered by your caregiver. Do not take aspirin because it can cause an upset stomach or bleeding. °· Try a clear liquid diet (broth or water) as ordered by your caregiver. Slowly move to a bland diet, as tolerated, if the pain is related to the stomach or intestine. °· Have a thermometer and take your temperature several times a day, and record it. °· Bed rest and sleep, if it helps the pain. °· Avoid sexual intercourse, if it causes pain. °· Avoid stressful situations. °· Keep your follow-up appointments and tests, as your caregiver orders. °· If the pain does not go away with medicine or surgery, you may try: °· Acupuncture. °· Relaxation exercises (yoga, meditation). °· Group therapy. °· Counseling. °SEEK MEDICAL CARE IF:  °· You notice certain foods cause stomach pain. °· Your home care treatment is not helping your pain. °· You need stronger pain medicine. °· You want your IUD removed. °· You feel faint or   lightheaded. °· You develop nausea and vomiting. °· You develop a rash. °· You are having side effects or an allergy to your medicine. °SEEK IMMEDIATE MEDICAL CARE IF:  °· Your  pain does not go away or gets worse. °· You have a fever. °· Your pain is felt only in portions of the abdomen. The right side could possibly be appendicitis. The left lower portion of the abdomen could be colitis or diverticulitis. °· You are passing blood in your stools (bright red or black tarry stools, with or without vomiting). °· You have blood in your urine. °· You develop chills, with or without a fever. °· You pass out. °MAKE SURE YOU:  °· Understand these instructions. °· Will watch your condition. °· Will get help right away if you are not doing well or get worse. °Document Released: 03/22/2007 Document Revised: 08/17/2011 Document Reviewed: 04/11/2009 °Exit[employer]® Patient Information ©2014 [employer]. ° °Bloating °Bloating is the feeling of fullness in your belly. You may feel as though your pants are too tight. Often the cause of bloating is overeating, retaining fluids, or having gas in your bowel. It is also caused by swallowing air and eating foods that cause gas. Irritable bowel syndrome is one of the most common causes of bloating. Constipation is also a common cause. Sometimes more serious problems can cause bloating. °SYMPTOMS  °Usually there is a feeling of fullness, as though your abdomen is bulged out. There may be mild discomfort.  °DIAGNOSIS  °Usually no particular testing is necessary for most bloating. If the condition persists and seems to become worse, your caregiver may do additional testing.  °TREATMENT  °· There is no direct treatment for bloating. °· Do not put gas into the bowel. Avoid chewing gum and sucking on candy. These tend to make you swallow air. Swallowing air can also be a nervous habit. Try to avoid this. °· Avoiding high residue diets will help. Eat foods with soluble fibers (examples include root vegetables, apples, or barley) and substitute dairy products with soy and rice products. This helps irritable bowel syndrome. °· If constipation is the cause, then a high  residue diet with more fiber will help. °· Avoid carbonated beverages. °· Over-the-counter preparations are available that help reduce gas. Your pharmacist can help you with this. °SEEK MEDICAL CARE IF:  °· Bloating continues and seems to be getting worse. °· You notice a weight gain. °· You have a weight loss but the bloating is getting worse. °· You have changes in your bowel habits or develop nausea or vomiting. °SEEK IMMEDIATE MEDICAL CARE IF:  °· You develop shortness of breath or swelling in your legs. °· You have an increase in abdominal pain or develop chest pain. °Document Released: 03/25/2006 Document Revised: 08/17/2011 Document Reviewed: 05/13/2007 °ExitCare® Patient Information ©2014 [employer]. ° °

## 2013-09-11 NOTE — ED Provider Notes (Signed)
CSN: [id number]     Arrival date & time 09/11/13  1120 History   First MD Initiated Contact with Patient 09/11/13 1335     Chief Complaint  Patient presents with  . Flank Pain   (Consider location/radiation/quality/duration/timing/severity/associated sxs/prior Treatment) HPI Comments: 63 year old female presents complaining of right flank pain that radiates around to her lower abdomen. This is the same problem she was seen for here 3 days ago, it has not gotten any better in the interim. At that time, she was told she had gas pains and constipation. She has used medications as directed. She is moving her bowels normally, but she is still having a significant amount of pain. The pain is constant and is worse with any movements. She has what she describes as gas bubble ago from her right flank around to the right side of her abdomen. She also endorses some nausea. She denies fever, chills, NVD, hematuria, dysuria, vomiting. Since she was here last, she has used MiraLAX and simethicone without relief.  Patient is a 63 y.o. female presenting with flank pain.  Flank Pain Associated symptoms include abdominal pain.    Past Medical History  Diagnosis Date  . CAD (coronary artery disease) 11/2003     RCA 60% stenosis, LAD diffuse  . CVA (cerebrovascular accident) 2001     right frontoparietal cortical CVA - MRI September 2001  . Asthma   . Diabetes mellitus   . GERD (gastroesophageal reflux disease)      EGD June 2004 -  followed by Dr. Leone PayorGessner  . Hyperlipidemia   . Hypertension   . OA (osteoarthritis)      DJP coracoclavicular ligament, left patella osteophytes June 2002, L4-S1 spondylosis, degenerative disc disease with bulge. I in October 2001, the C2-C4 spondylosis without stenosis and with spurs based on x-ray July 2005; diffuse idiopathic skeletal hyperostosis with bilateral hip lumbar degenerative changes   on x-ray February 2005, bilateral plantar calcaneal spurs  . Diastolic dysfunction       ejection fraction 55-65% on 2-D echo May 2006  . Empty sella syndrome      based on MRI September 2001, related to obesity, hypertension, it was determined that patient has not required treatment but will be monitored with TSH, ACTH, cortisol, testosterone, prolactin, growth hormone  . NAFLD (nonalcoholic fatty liver disease)      based on ultrasound June 2002, elevated transaminase  . Reactive airway disease      peak flow 180 in May 2005, chronic sinusitis with PND, bronchitis December 2002 and strep pneumonia April 2007,  FEC 66, MCV 60, ratio is 69, DLCO 57,  moderate restrictive and mild obstructive disease  on spirometry May 2005  . Granulomatous disease, chronic      in right upper lobe per x-ray December 2005   Past Surgical History  Procedure Laterality Date  . Cholecystectomy  2003  . Carpal tunnel release  1992     left arm 1992  . Tonsillectomy  ?[id number]   Family History  Problem Relation Age of Onset  . Lung cancer Mother    History  Substance Use Topics  . Smoking status: Former Games developermoker  . Smokeless tobacco: Not on file  . Alcohol Use: No   OB History   Grav Para Term Preterm Abortions TAB SAB Ect Mult Living                 Review of Systems  Gastrointestinal: Positive for nausea and abdominal pain.  Genitourinary: Positive for  flank pain.  All other systems reviewed and are negative.    Allergies  Aspirin-dipyridamole er; Atorvastatin; Clopidogrel bisulfate; Fexofenadine; Fluticasone propionate; Furosemide; Glipizide; Hydrochlorothiazide w-triamterene; Lisinopril; Metformin; Metoprolol tartrate; Mometasone furoate; and Omeprazole  Home Medications   Current Outpatient Rx  Name  Route  Sig  Dispense  Refill  . amLODipine (NORVASC) 10 MG tablet   Oral   Take 10 mg by mouth daily.           Marland Kitchen glipiZIDE (GLUCOTROL) 5 MG tablet   Oral   Take 5 mg by mouth 2 (two) times daily before a meal.           . hydrochlorothiazide 25 MG tablet   Oral    Take 25 mg by mouth daily.           Marland Kitchen LORATADINE PO   Oral   Take by mouth.         Marland Kitchen albuterol (PROAIR HFA) 108 (90 BASE) MCG/ACT inhaler   Inhalation   Inhale 2 puffs into the lungs every 4 (four) hours as needed.           . bisacodyl (BISACODYL) 5 MG EC tablet   Oral   Take 5 mg by mouth daily as needed for moderate constipation.         . fluticasone (FLONASE) 50 MCG/ACT nasal spray   Nasal   1 spray by Nasal route daily.           . metFORMIN (GLUCOPHAGE) 1000 MG tablet   Oral   Take 1,000 mg by mouth 2 (two) times daily with a meal.           . omeprazole (PRILOSEC) 20 MG capsule   Oral   Take 20 mg by mouth 2 (two) times daily.           . ranitidine (ZANTAC) 150 MG capsule   Oral   Take 1 capsule (150 mg total) by mouth 2 (two) times daily.   60 capsule   2   . Simethicone 180 MG CAPS      1 cap PO Q4 hrs PRN for gas pains   30 capsule   0   . sucralfate (CARAFATE) 1 GM/10ML suspension   Oral   Take 10 mLs (1 g total) by mouth 4 (four) times daily -  with meals and at bedtime.   420 mL   0   . traMADol (ULTRAM) 50 MG tablet   Oral   Take 1 tablet (50 mg total) by mouth every 6 (six) hours as needed for pain.   25 tablet   0   . valsartan (DIOVAN) 40 MG tablet   Oral   Take 40 mg by mouth daily.          Marland Kitchen VALSARTAN-HYDROCHLOROTHIAZIDE PO   Oral   Take by mouth.          BP 133/79  Pulse 104  Temp(Src) 98.6 F (37 C) (Oral)  Resp 20  SpO2 98% Physical Exam  Nursing note and vitals reviewed. Constitutional: She is oriented to person, place, and time. Vital signs are normal. She appears well-developed and well-nourished. No distress.  Morbidly obese body habitus  HENT:  Head: Normocephalic and atraumatic.  Cardiovascular: Normal rate, regular rhythm and normal heart sounds.  Exam reveals no gallop and no friction rub.   No murmur heard. Pulmonary/Chest: Effort normal and breath sounds normal. No respiratory distress.  She has no wheezes. She has no rales.  Abdominal: Normal appearance. There is tenderness in the right lower quadrant and epigastric area. There is CVA tenderness (right-sided). There is no rigidity, no rebound, no guarding, no tenderness at McBurney's point and negative Murphy's sign.  Exam difficult due to habitus  Neurological: She is alert and oriented to person, place, and time. She has normal strength. Coordination normal.  Skin: Skin is warm and dry. No rash noted. She is not diaphoretic.  Psychiatric: She has a normal mood and affect. Judgment normal.    ED Course  Procedures (including critical care time) Labs Review Labs Reviewed  GLUCOSE, CAPILLARY - Abnormal; Notable for the following:    Glucose-Capillary 159 (*)    All other components within normal limits  CBC WITH DIFFERENTIAL - Abnormal; Notable for the following:    RBC 5.38 (*)    All other components within normal limits  COMPREHENSIVE METABOLIC PANEL - Abnormal; Notable for the following:    Potassium 3.2 (*)    Chloride 93 (*)    Glucose, Bld 181 (*)    GFR calc non Af Amer 88 (*)    All other components within normal limits  LIPASE, BLOOD  AMYLASE  POCT URINALYSIS DIP (DEVICE)   Imaging Review No results found.    MDM   1. Abdominal pain   2. Gas pain   3. Gastritis    Labs all normal. Pain relieved with GI cocktail. We'll treat for gas and gastritis. Followup if not improving   Meds ordered this encounter  Medications  . gi cocktail (Maalox,Lidocaine,Donnatal)    Sig:   . ranitidine (ZANTAC) 150 MG capsule    Sig: Take 1 capsule (150 mg total) by mouth 2 (two) times daily.    Dispense:  60 capsule    Refill:  2    Order Specific Question:  Supervising Provider    Answer:  Lorenz Coaster, DAVID C [id number]  . Simethicone 180 MG CAPS    Sig: 1 cap PO Q4 hrs PRN for gas pains    Dispense:  30 capsule    Refill:  0    Order Specific Question:  Supervising Provider    Answer:  Lorenz Coaster, DAVID C [id number]  .  sucralfate (CARAFATE) 1 GM/10ML suspension    Sig: Take 10 mLs (1 g total) by mouth 4 (four) times daily -  with meals and at bedtime.    Dispense:  420 mL    Refill:  0    Order Specific Question:  Supervising Provider    Answer:  Lorenz Coaster, DAVID C [[id number]]       Graylon Good, PA-C 09/11/13 [phone]

## 2013-09-11 NOTE — ED Notes (Signed)
C/o left flank pain that radiates around to front of lower abdomen.  Pt was seen on Thursday for same problem.   States "not feeling any better"  No relief with otc meds.

## 2013-09-19 ENCOUNTER — Emergency Department (HOSPITAL_COMMUNITY): Payer: Medicare Other

## 2013-09-19 ENCOUNTER — Encounter (HOSPITAL_COMMUNITY): Payer: Self-pay | Admitting: Emergency Medicine

## 2013-09-19 ENCOUNTER — Emergency Department (HOSPITAL_COMMUNITY)
Admission: EM | Admit: 2013-09-19 | Discharge: 2013-09-19 | Disposition: A | Discharge status: Home / Self Care | Payer: Medicare Other | Attending: Emergency Medicine | Admitting: Emergency Medicine

## 2013-09-19 DIAGNOSIS — E119 Type 2 diabetes mellitus without complications: Secondary | ICD-10-CM | POA: Insufficient documentation

## 2013-09-19 DIAGNOSIS — Z862 Personal history of diseases of the blood and blood-forming organs and certain disorders involving the immune mechanism: Secondary | ICD-10-CM | POA: Insufficient documentation

## 2013-09-19 DIAGNOSIS — I2510 Atherosclerotic heart disease of native coronary artery without angina pectoris: Secondary | ICD-10-CM | POA: Insufficient documentation

## 2013-09-19 DIAGNOSIS — Z9089 Acquired absence of other organs: Secondary | ICD-10-CM | POA: Insufficient documentation

## 2013-09-19 DIAGNOSIS — E785 Hyperlipidemia, unspecified: Secondary | ICD-10-CM | POA: Insufficient documentation

## 2013-09-19 DIAGNOSIS — K219 Gastro-esophageal reflux disease without esophagitis: Secondary | ICD-10-CM | POA: Insufficient documentation

## 2013-09-19 DIAGNOSIS — R739 Hyperglycemia, unspecified: Secondary | ICD-10-CM

## 2013-09-19 DIAGNOSIS — R1031 Right lower quadrant pain: Secondary | ICD-10-CM | POA: Insufficient documentation

## 2013-09-19 DIAGNOSIS — Z8673 Personal history of transient ischemic attack (TIA), and cerebral infarction without residual deficits: Secondary | ICD-10-CM | POA: Insufficient documentation

## 2013-09-19 DIAGNOSIS — Z87891 Personal history of nicotine dependence: Secondary | ICD-10-CM | POA: Insufficient documentation

## 2013-09-19 DIAGNOSIS — Z79899 Other long term (current) drug therapy: Secondary | ICD-10-CM | POA: Insufficient documentation

## 2013-09-19 DIAGNOSIS — R109 Unspecified abdominal pain: Secondary | ICD-10-CM

## 2013-09-19 DIAGNOSIS — J45909 Unspecified asthma, uncomplicated: Secondary | ICD-10-CM | POA: Insufficient documentation

## 2013-09-19 DIAGNOSIS — M1990 Unspecified osteoarthritis, unspecified site: Secondary | ICD-10-CM | POA: Insufficient documentation

## 2013-09-19 DIAGNOSIS — I10 Essential (primary) hypertension: Secondary | ICD-10-CM | POA: Insufficient documentation

## 2013-09-19 LAB — BASIC METABOLIC PANEL
BUN: 10 mg/dL (ref 6–23)
CO2: 28 mEq/L (ref 19–32)
Calcium: 9.5 mg/dL (ref 8.4–10.5)
Chloride: 95 mEq/L — ABNORMAL LOW (ref 96–112)
Creatinine, Ser: 0.79 mg/dL (ref 0.50–1.10)
GFR, EST NON AFRICAN AMERICAN: 86 mL/min — AB (ref >90)
Glucose, Bld: 205 mg/dL — ABNORMAL HIGH (ref 70–99)
POTASSIUM: 3.3 meq/L — AB (ref 3.7–5.3)
Sodium: 137 mEq/L (ref 137–147)

## 2013-09-19 LAB — HEPATIC FUNCTION PANEL
ALT: 24 U/L (ref 0–35)
AST: 22 U/L (ref 0–37)
Albumin: 3.3 g/dL — ABNORMAL LOW (ref 3.5–5.2)
Alkaline Phosphatase: 72 U/L (ref 39–117)
BILIRUBIN TOTAL: 0.4 mg/dL (ref 0.3–1.2)
Total Protein: 7.4 g/dL (ref 6.0–8.3)

## 2013-09-19 LAB — CBC WITH DIFFERENTIAL/PLATELET
Basophils Absolute: 0 10*3/uL (ref 0.0–0.1)
Basophils Relative: 0 % (ref 0–1)
EOS ABS: 0.1 10*3/uL (ref 0.0–0.7)
EOS PCT: 1 % (ref 0–5)
HCT: 40.3 % (ref 36.0–46.0)
HEMOGLOBIN: 14 g/dL (ref 12.0–15.0)
Lymphocytes Relative: 25 % (ref 12–46)
Lymphs Abs: 1.5 10*3/uL (ref 0.7–4.0)
MCH: 27.2 pg (ref 26.0–34.0)
MCHC: 34.7 g/dL (ref 30.0–36.0)
MCV: 78.3 fL (ref 78.0–100.0)
MONO ABS: 0.7 10*3/uL (ref 0.1–1.0)
Monocytes Relative: 12 % (ref 3–12)
NEUTROS ABS: 3.7 10*3/uL (ref 1.7–7.7)
NEUTROS PCT: 62 % (ref 43–77)
Platelets: 330 10*3/uL (ref 150–400)
RBC: 5.15 MIL/uL — AB (ref 3.87–5.11)
RDW: 13.6 % (ref 11.5–15.5)
WBC: 6 10*3/uL (ref 4.0–10.5)

## 2013-09-19 LAB — LIPASE, BLOOD: LIPASE: 29 U/L (ref 11–59)

## 2013-09-19 MED ORDER — ONDANSETRON HCL 4 MG/2ML IJ SOLN
4.0000 mg | Freq: Once | INTRAMUSCULAR | Status: AC
  Administered 2013-09-19: 4 mg via INTRAVENOUS
  Filled 2013-09-19: qty 2

## 2013-09-19 MED ORDER — IOHEXOL 300 MG/ML  SOLN
25.0000 mL | Freq: Once | INTRAMUSCULAR | Status: AC | PRN
  Administered 2013-09-19: 25 mL via ORAL

## 2013-09-19 MED ORDER — METOCLOPRAMIDE HCL 10 MG PO TABS: 10.0000 mg | ORAL_TABLET | Freq: Four times a day (QID) | ORAL | Status: DC

## 2013-09-19 MED ORDER — IOHEXOL 300 MG/ML  SOLN
80.0000 mL | Freq: Once | INTRAMUSCULAR | Status: AC | PRN
  Administered 2013-09-19: 80 mL via INTRAVENOUS

## 2013-09-19 MED ORDER — HYDROMORPHONE HCL PF 1 MG/ML IJ SOLN
1.0000 mg | Freq: Once | INTRAMUSCULAR | Status: AC
  Administered 2013-09-19: 1 mg via INTRAVENOUS
  Filled 2013-09-19: qty 1

## 2013-09-19 MED ORDER — SODIUM CHLORIDE 0.9 % IV BOLUS (SEPSIS)
1000.0000 mL | Freq: Once | INTRAVENOUS | Status: AC
  Administered 2013-09-19: 1000 mL via INTRAVENOUS

## 2013-09-19 NOTE — ED Notes (Signed)
Pt finished with oral contrast. CT made aware.

## 2013-09-19 NOTE — ED Provider Notes (Signed)
CSN: [id number]     Arrival date & time 09/19/13  1052 History   First MD Initiated Contact with Patient 09/19/13 1229     Chief Complaint  Patient presents with  . Flank Pain     (Consider location/radiation/quality/duration/timing/severity/associated sxs/prior Treatment) HPI 63 year old female comes in today complaining of right flank pain radiating to her back and parietal quadrant intermittently over several weeks. She has been seen and evaluated at urgent care and states that she was told that they thought she had gas pain and constipation. She has had a cholecystectomy in the past. She denies nausea, vomiting, diarrhea, fever, chills, frequency of urination, or hematuria. She has not had similar symptoms like this in the past. She does have a primary care physician at all for medical care and states that she has been seen there and given various pain medications. Past Medical History  Diagnosis Date  . CAD (coronary artery disease) 11/2003     RCA 60% stenosis, LAD diffuse  . CVA (cerebrovascular accident) 2001     right frontoparietal cortical CVA - MRI September 2001  . Asthma   . Diabetes mellitus   . GERD (gastroesophageal reflux disease)      EGD June 2004 -  followed by Dr. Leone Payor  . Hyperlipidemia   . Hypertension   . OA (osteoarthritis)      DJP coracoclavicular ligament, left patella osteophytes June 2002, L4-S1 spondylosis, degenerative disc disease with bulge. I in October 2001, the C2-C4 spondylosis without stenosis and with spurs based on x-Pasqualino Witherspoon July 2005; diffuse idiopathic skeletal hyperostosis with bilateral hip lumbar degenerative changes   on x-Irisha Grandmaison February 2005, bilateral plantar calcaneal spurs  . Diastolic dysfunction      ejection fraction 55-65% on 2-D echo May 2006  . Empty sella syndrome      based on MRI September 2001, related to obesity, hypertension, it was determined that patient has not required treatment but will be monitored with TSH, ACTH,  cortisol, testosterone, prolactin, growth hormone  . NAFLD (nonalcoholic fatty liver disease)      based on ultrasound June 2002, elevated transaminase  . Reactive airway disease      peak flow 180 in May 2005, chronic sinusitis with PND, bronchitis December 2002 and strep pneumonia April 2007,  FEC 66, MCV 60, ratio is 69, DLCO 57,  moderate restrictive and mild obstructive disease  on spirometry May 2005  . Granulomatous disease, chronic      in right upper lobe per x-Kiah Keay December 2005   Past Surgical History  Procedure Laterality Date  . Cholecystectomy  2003  . Carpal tunnel release  1992     left arm 1992  . Tonsillectomy  ?42   Family History  Problem Relation Age of Onset  . Lung cancer Mother    History  Substance Use Topics  . Smoking status: Former Games developer  . Smokeless tobacco: Not on file  . Alcohol Use: No   OB History   Grav Para Term Preterm Abortions TAB SAB Ect Mult Living                 Review of Systems  All other systems reviewed and are negative.     Allergies  Atorvastatin; Clopidogrel bisulfate; Fexofenadine; Furosemide; Hydrochlorothiazide w-triamterene; Lisinopril; Metoprolol tartrate; and Mometasone furoate  Home Medications   Prior to Admission medications   Medication Sig Start Date End Date Taking? Authorizing Provider  albuterol (PROAIR HFA) 108 (90 BASE) MCG/ACT inhaler Inhale 2 puffs into  the lungs every 4 (four) hours as needed.     Yes Historical Provider, MD  amLODipine (NORVASC) 10 MG tablet Take 10 mg by mouth daily.     Yes Historical Provider, MD  glipiZIDE (GLUCOTROL) 5 MG tablet Take 10 mg by mouth 2 (two) times daily before a meal.    Yes Historical Provider, MD  hydrochlorothiazide 25 MG tablet Take 25 mg by mouth daily.     Yes Historical Provider, MD  HYDROcodone-acetaminophen (NORCO/VICODIN) 5-325 MG per tablet Take 1 tablet by mouth daily. 09/15/13  Yes Historical Provider, MD  ibuprofen (ADVIL,MOTRIN) 800 MG tablet Take  800 mg by mouth every 6 (six) hours as needed. 08/30/13  Yes Historical Provider, MD  LINZESS 145 MCG CAPS capsule Take 145 mcg by mouth daily. 09/15/13  Yes Historical Provider, MD  metFORMIN (GLUCOPHAGE) 1000 MG tablet Take 1,000 mg by mouth 2 (two) times daily with a meal.     Yes Historical Provider, MD  omeprazole (PRILOSEC) 20 MG capsule Take 20 mg by mouth 2 (two) times daily.     Yes Historical Provider, MD  potassium chloride (K-DUR) 10 MEQ tablet Take 10 mEq by mouth daily. 08/31/13  Yes Historical Provider, MD  ranitidine (ZANTAC) 150 MG capsule Take 1 capsule (150 mg total) by mouth 2 (two) times daily. 09/11/13  Yes Graylon GoodZachary H Baker, PA-C  Simethicone 180 MG CAPS Take 180 mg by mouth every 4 (four) hours as needed (gas).   Yes Historical Provider, MD   BP 129/82  Pulse 96  Temp(Src) 98.2 F (36.8 C) (Oral)  Resp 18  Ht 5\' 5"  (1.651 m)  Wt 249 lb 7 oz (113.144 kg)  BMI 41.51 kg/m2  SpO2 98% Physical Exam  Nursing note and vitals reviewed. Constitutional: She is oriented to person, place, and time. She appears well-developed and well-nourished.  HENT:  Head: Normocephalic and atraumatic.  Right Ear: External ear normal.  Left Ear: External ear normal.  Nose: Nose normal.  Mouth/Throat: Oropharynx is clear and moist.  Eyes: Conjunctivae and EOM are normal. Pupils are equal, round, and reactive to light.  Neck: Normal range of motion. Neck supple. No JVD present. No tracheal deviation present. No thyromegaly present.  Cardiovascular: Normal rate, regular rhythm, normal heart sounds and intact distal pulses.   Pulmonary/Chest: Effort normal and breath sounds normal. No respiratory distress. She has no wheezes.  Abdominal: Soft. Bowel sounds are normal. She exhibits no mass. There is tenderness. There is no rebound and no guarding.  Diffuse tenderness palpation of right lower quadrant of abdomen without rebound. Abdomen is soft and no guarding is noted.  Musculoskeletal: Normal range  of motion.  Lymphadenopathy:    She has no cervical adenopathy.  Neurological: She is alert and oriented to person, place, and time. She has normal reflexes. No cranial nerve deficit or sensory deficit. Gait normal. GCS eye subscore is 4. GCS verbal subscore is 5. GCS motor subscore is 6.  Reflex Scores:      Bicep reflexes are 2+ on the right side and 2+ on the left side.      Patellar reflexes are 2+ on the right side and 2+ on the left side. Strength is 5/5 bilateral elbow flexor/extensors, wrist extension/flexion, intrinsic hand strength equal Bilateral hip flexion/extension 5/5, knee flexion/extension 5/5, ankle 5/5 flexion extension    Skin: Skin is warm and dry.  Psychiatric: She has a normal mood and affect. Her behavior is normal. Judgment and thought content normal.  ED Course  Procedures (including critical care time) Labs Review Labs Reviewed  BASIC METABOLIC PANEL - Abnormal; Notable for the following:    Potassium 3.3 (*)    Chloride 95 (*)    Glucose, Bld 205 (*)    GFR calc non Af Amer 86 (*)    All other components within normal limits  CBC WITH DIFFERENTIAL - Abnormal; Notable for the following:    RBC 5.15 (*)    All other components within normal limits  LIPASE, BLOOD  HEPATIC FUNCTION PANEL    Imaging Review Ct Abdomen Pelvis W Contrast  09/19/2013   CLINICAL DATA:  Right flank pain.  EXAM: CT ABDOMEN AND PELVIS WITH CONTRAST  TECHNIQUE: Multidetector CT imaging of the abdomen and pelvis was performed using the standard protocol following bolus administration of intravenous contrast.  CONTRAST:  80mL OMNIPAQUE IOHEXOL 300 MG/ML  SOLN  COMPARISON:  None.  FINDINGS: Lung bases are clear.  No effusions.  Heart is normal size.  Prior cholecystectomy. Liver, spleen, adrenals are unremarkable. Small hypodensities in the left kidney compatible with small cysts. Right kidney unremarkable. No hydronephrosis.  Calcifications in the pancreatic body are likely vascular.  No focal pancreatic abnormality.  Multiple calcified fibroids within the uterus. Ovaries and bladder are grossly unremarkable. No free fluid, free air or adenopathy.  Appendix is visualized and is normal. Stomach, large and small bowel unremarkable.  Aorta and iliac vessels are calcified, non aneurysmal. No acute bony abnormality.  IMPRESSION: No acute findings in the abdomen or pelvis.  Prior cholecystectomy.  Multiple uterine fibroids.   Electronically Signed   By: Charlett NoseKevin  Dover M.D.   On: 09/19/2013 16:02     EKG Interpretation None      MDM   Final diagnoses:  None    Patient with right lower quadrant pain with no evidence of appendicitis, renal colic, or urinary tract infection .  I have discussed the results of her workup the patient. She is given return precautions and advised have close followup and voices understanding.    Hilario Quarryanielle S Liba Hulsey, MD 09/19/13 [phone]

## 2013-09-19 NOTE — ED Notes (Signed)
Lab reports they can add on Hepatic Panel.

## 2013-09-19 NOTE — ED Notes (Signed)
Pt c/o R flank pain.  [facility] and states she has been treated for gas and constipation.  Pt states pain is not as bad as before, but she is still having pain.  Pt ambulatory without assistance.  Pain has been ongoing x 6 weeks.

## 2013-09-19 NOTE — ED Notes (Signed)
Patient transported to CT 

## 2013-09-19 NOTE — Discharge Instructions (Signed)
Abdominal Pain, Adult °Many things can cause abdominal pain. Usually, abdominal pain is not caused by a disease and will improve without treatment. It can often be observed and treated at home. Your health care provider will do a physical exam and possibly order blood tests and X-rays to help determine the seriousness of your pain. However, in many cases, more time must pass before a clear cause of the pain can be found. Before that point, your health care provider may not know if you need more testing or further treatment. °HOME CARE INSTRUCTIONS  °Monitor your abdominal pain for any changes. The following actions may help to alleviate any discomfort you are experiencing: °· Only take over-the-counter or prescription medicines as directed by your health care provider. °· Do not take laxatives unless directed to do so by your health care provider. °· Try a clear liquid diet (broth, tea, or water) as directed by your health care provider. Slowly move to a bland diet as tolerated. °SEEK MEDICAL CARE IF: °· You have unexplained abdominal pain. °· You have abdominal pain associated with nausea or diarrhea. °· You have pain when you urinate or have a bowel movement. °· You experience abdominal pain that wakes you in the night. °· You have abdominal pain that is worsened or improved by eating food. °· You have abdominal pain that is worsened with eating fatty foods. °SEEK IMMEDIATE MEDICAL CARE IF:  °· Your pain does not go away within 2 hours. °· You have a fever. °· You keep throwing up (vomiting). °· Your pain is felt only in portions of the abdomen, such as the right side or the left lower portion of the abdomen. °· You pass bloody or black tarry stools. °MAKE SURE YOU: °· Understand these instructions.   °· Will watch your condition.   °· Will get help right away if you are not doing well or get worse.   °Document Released: 03/04/2005 Document Revised: 03/15/2013 Document Reviewed: 02/01/2013 °ExitCare® Patient  Information ©2014 [employer]. ° °

## 2013-12-30 ENCOUNTER — Emergency Department (HOSPITAL_COMMUNITY)
Admission: EM | Admit: 2013-12-30 | Discharge: 2013-12-30 | Disposition: A | Discharge status: Home / Self Care | Payer: Medicare Other | Attending: Emergency Medicine | Admitting: Emergency Medicine

## 2013-12-30 ENCOUNTER — Ambulatory Visit: Payer: Medicare PPO | Admitting: Internal Medicine

## 2013-12-30 ENCOUNTER — Emergency Department (HOSPITAL_COMMUNITY): Payer: Medicare Other

## 2013-12-30 ENCOUNTER — Encounter (HOSPITAL_COMMUNITY): Payer: Self-pay | Admitting: Emergency Medicine

## 2013-12-30 DIAGNOSIS — I2510 Atherosclerotic heart disease of native coronary artery without angina pectoris: Secondary | ICD-10-CM | POA: Diagnosis not present

## 2013-12-30 DIAGNOSIS — K5900 Constipation, unspecified: Secondary | ICD-10-CM | POA: Insufficient documentation

## 2013-12-30 DIAGNOSIS — Z862 Personal history of diseases of the blood and blood-forming organs and certain disorders involving the immune mechanism: Secondary | ICD-10-CM | POA: Diagnosis not present

## 2013-12-30 DIAGNOSIS — Z79899 Other long term (current) drug therapy: Secondary | ICD-10-CM | POA: Insufficient documentation

## 2013-12-30 DIAGNOSIS — R0602 Shortness of breath: Secondary | ICD-10-CM | POA: Insufficient documentation

## 2013-12-30 DIAGNOSIS — M1990 Unspecified osteoarthritis, unspecified site: Secondary | ICD-10-CM | POA: Insufficient documentation

## 2013-12-30 DIAGNOSIS — G8929 Other chronic pain: Secondary | ICD-10-CM | POA: Diagnosis not present

## 2013-12-30 DIAGNOSIS — Z9089 Acquired absence of other organs: Secondary | ICD-10-CM | POA: Insufficient documentation

## 2013-12-30 DIAGNOSIS — R1084 Generalized abdominal pain: Secondary | ICD-10-CM

## 2013-12-30 DIAGNOSIS — Z87891 Personal history of nicotine dependence: Secondary | ICD-10-CM | POA: Insufficient documentation

## 2013-12-30 DIAGNOSIS — E119 Type 2 diabetes mellitus without complications: Secondary | ICD-10-CM | POA: Diagnosis not present

## 2013-12-30 DIAGNOSIS — K219 Gastro-esophageal reflux disease without esophagitis: Secondary | ICD-10-CM | POA: Insufficient documentation

## 2013-12-30 DIAGNOSIS — Z8673 Personal history of transient ischemic attack (TIA), and cerebral infarction without residual deficits: Secondary | ICD-10-CM | POA: Insufficient documentation

## 2013-12-30 DIAGNOSIS — J45909 Unspecified asthma, uncomplicated: Secondary | ICD-10-CM | POA: Diagnosis not present

## 2013-12-30 DIAGNOSIS — I10 Essential (primary) hypertension: Secondary | ICD-10-CM | POA: Insufficient documentation

## 2013-12-30 DIAGNOSIS — K861 Other chronic pancreatitis: Secondary | ICD-10-CM | POA: Insufficient documentation

## 2013-12-30 LAB — CBC
HEMATOCRIT: 41.5 % (ref 36.0–46.0)
Hemoglobin: 14.1 g/dL (ref 12.0–15.0)
MCH: 26.9 pg (ref 26.0–34.0)
MCHC: 34 g/dL (ref 30.0–36.0)
MCV: 79 fL (ref 78.0–100.0)
PLATELETS: 314 10*3/uL (ref 150–400)
RBC: 5.25 MIL/uL — ABNORMAL HIGH (ref 3.87–5.11)
RDW: 13.5 % (ref 11.5–15.5)
WBC: 5.1 10*3/uL (ref 4.0–10.5)

## 2013-12-30 LAB — BASIC METABOLIC PANEL
ANION GAP: 12 (ref 5–15)
BUN: 9 mg/dL (ref 6–23)
CO2: 30 mEq/L (ref 19–32)
CREATININE: 0.72 mg/dL (ref 0.50–1.10)
Calcium: 9.9 mg/dL (ref 8.4–10.5)
Chloride: 96 mEq/L (ref 96–112)
GFR calc non Af Amer: 89 mL/min — ABNORMAL LOW (ref >90)
Glucose, Bld: 195 mg/dL — ABNORMAL HIGH (ref 70–99)
Potassium: 3.9 mEq/L (ref 3.7–5.3)
Sodium: 138 mEq/L (ref 137–147)

## 2013-12-30 LAB — I-STAT TROPONIN, ED: Troponin i, poc: 0 ng/mL (ref 0.00–0.08)

## 2013-12-30 LAB — HEPATIC FUNCTION PANEL
ALBUMIN: 3.4 g/dL — AB (ref 3.5–5.2)
ALT: 46 U/L — ABNORMAL HIGH (ref 0–35)
AST: 35 U/L (ref 0–37)
Alkaline Phosphatase: 77 U/L (ref 39–117)
BILIRUBIN TOTAL: 0.5 mg/dL (ref 0.3–1.2)
Bilirubin, Direct: <0.2 mg/dL (ref 0.0–0.3)
Total Protein: 7.1 g/dL (ref 6.0–8.3)

## 2013-12-30 LAB — PRO B NATRIURETIC PEPTIDE: PRO B NATRI PEPTIDE: 15.8 pg/mL (ref 0–125)

## 2013-12-30 LAB — URINALYSIS, ROUTINE W REFLEX MICROSCOPIC
Bilirubin Urine: NEGATIVE
Glucose, UA: 100 mg/dL — AB
Hgb urine dipstick: NEGATIVE
Ketones, ur: NEGATIVE mg/dL
LEUKOCYTES UA: NEGATIVE
NITRITE: NEGATIVE
PROTEIN: NEGATIVE mg/dL
Specific Gravity, Urine: 1.02 (ref 1.005–1.030)
Urobilinogen, UA: 1 mg/dL (ref 0.0–1.0)
pH: 5.5 (ref 5.0–8.0)

## 2013-12-30 LAB — D-DIMER, QUANTITATIVE: D-Dimer, Quant: <0.27 ug/mL-FEU (ref 0.00–0.48)

## 2013-12-30 LAB — LIPASE, BLOOD: LIPASE: 25 U/L (ref 11–59)

## 2013-12-30 MED ORDER — OXYCODONE-ACETAMINOPHEN 5-325 MG PO TABS: 1.0000 | ORAL_TABLET | Freq: Four times a day (QID) | ORAL | Status: DC | PRN

## 2013-12-30 MED ORDER — HYDROMORPHONE HCL PF 1 MG/ML IJ SOLN
1.0000 mg | Freq: Once | INTRAMUSCULAR | Status: AC
  Administered 2013-12-30: 1 mg via INTRAMUSCULAR
  Filled 2013-12-30: qty 1

## 2013-12-30 NOTE — ED Notes (Signed)
Terri Piedraourtney Forcucci, PA at bedside

## 2013-12-30 NOTE — ED Notes (Signed)
Will monitor patient until 1814

## 2013-12-30 NOTE — ED Provider Notes (Signed)
CSN: [id number]     Arrival date & time 12/30/13  1217 History   First MD Initiated Contact with Patient 12/30/13 1320     Chief Complaint  Patient presents with  . Abdominal Pain  . Shortness of Breath   HPI Comments: Patient is a 63 y.o. Female who presents to the ED with abdominal pain and also shortness of breath.  Patient states that since April she has had abdominal pain which is located on the right lower quadrant the right upper quadrant and the left lower quadrant that radiates to her back.  Patient states that the pain is severe and keeps her from sleeping.  Her pain radiates occasionally to her back.  Patient states that she saw her endocrinologist who started her on Creon 3,000 for what he told her was chronic pancreatitis.  Patient states that since starting that medication 2 weeks ago she has had worsening constipation, abdominal pain, and intermittent shortness of breath.  Patient stopped taking the medication 1 week ago, but has not had any relief of her symptoms.  Patient has been taking hydrocodone 5/325 for her abdominal pain, but has not had complete relief.  She states that her pain is worse with lying down and eating.  Patient has been seen for this abdominal pain in the urgent care and by her PCP and has not gotten an answer at this time.  Patient is frustrated with her chronic abdominal pain at this time.  Patient was seen here on 09/19/13 and had a CT scan for the same abdominal pain which showed no abnormalities at that time.  Patient states her pain now is similar to then.    Patient is a 63 y.o. female presenting with abdominal pain and shortness of breath. The history is provided by the patient. No language interpreter was used.  Abdominal Pain Associated symptoms: fatigue and shortness of breath   Associated symptoms: no chest pain, no chills, no constipation, no cough, no diarrhea, no dysuria, no fever, no hematuria, no nausea and no vomiting   Shortness of  Breath Associated symptoms: abdominal pain   Associated symptoms: no chest pain, no cough, no fever and no vomiting     Past Medical History  Diagnosis Date  . CAD (coronary artery disease) 11/2003     RCA 60% stenosis, LAD diffuse  . CVA (cerebrovascular accident) 2001     right frontoparietal cortical CVA - MRI September 2001  . Asthma   . Diabetes mellitus   . GERD (gastroesophageal reflux disease)      EGD June 2004 -  followed by Dr. Leone Payor  . Hyperlipidemia   . Hypertension   . OA (osteoarthritis)      DJP coracoclavicular ligament, left patella osteophytes June 2002, L4-S1 spondylosis, degenerative disc disease with bulge. I in October 2001, the C2-C4 spondylosis without stenosis and with spurs based on x-ray July 2005; diffuse idiopathic skeletal hyperostosis with bilateral hip lumbar degenerative changes   on x-ray February 2005, bilateral plantar calcaneal spurs  . Diastolic dysfunction      ejection fraction 55-65% on 2-D echo May 2006  . Empty sella syndrome      based on MRI September 2001, related to obesity, hypertension, it was determined that patient has not required treatment but will be monitored with TSH, ACTH, cortisol, testosterone, prolactin, growth hormone  . NAFLD (nonalcoholic fatty liver disease)      based on ultrasound June 2002, elevated transaminase  . Reactive airway disease  peak flow 180 in May 2005, chronic sinusitis with PND, bronchitis December 2002 and strep pneumonia April 2007,  FEC 66, MCV 60, ratio is 69, DLCO 57,  moderate restrictive and mild obstructive disease  on spirometry May 2005  . Granulomatous disease, chronic      in right upper lobe per x-ray December 2005   Past Surgical History  Procedure Laterality Date  . Cholecystectomy  2003  . Carpal tunnel release  1992     left arm 1992  . Tonsillectomy  ?78   Family History  Problem Relation Age of Onset  . Lung cancer Mother    History  Substance Use Topics  . Smoking  status: Former Games developer  . Smokeless tobacco: Not on file  . Alcohol Use: No   OB History   Grav Para Term Preterm Abortions TAB SAB Ect Mult Living                 Review of Systems  Constitutional: Positive for fatigue. Negative for fever and chills.  HENT: Negative for congestion, postnasal drip, rhinorrhea and sinus pressure.   Respiratory: Positive for shortness of breath. Negative for cough and chest tightness.   Cardiovascular: Negative for chest pain, palpitations and leg swelling.  Gastrointestinal: Positive for abdominal pain. Negative for nausea, vomiting, diarrhea, constipation and blood in stool.  Genitourinary: Negative for dysuria, urgency, frequency, hematuria, flank pain and difficulty urinating.  All other systems reviewed and are negative.     Allergies  Atorvastatin; Clopidogrel bisulfate; Fexofenadine; Furosemide; Hydrochlorothiazide w-triamterene; Lisinopril; Metoprolol tartrate; and Mometasone furoate  Home Medications   Prior to Admission medications   Medication Sig Start Date End Date Taking? Authorizing Provider  albuterol (PROAIR HFA) 108 (90 BASE) MCG/ACT inhaler Inhale 2 puffs into the lungs every 4 (four) hours as needed for shortness of breath.    Yes Historical Provider, MD  amLODipine (NORVASC) 10 MG tablet Take 10 mg by mouth daily.     Yes Historical Provider, MD  glipiZIDE (GLUCOTROL) 5 MG tablet Take 10 mg by mouth 2 (two) times daily before a meal.    Yes Historical Provider, MD  hydrochlorothiazide 25 MG tablet Take 25 mg by mouth daily.     Yes Historical Provider, MD  HYDROcodone-acetaminophen (NORCO/VICODIN) 5-325 MG per tablet Take 1 tablet by mouth 3 (three) times daily as needed (for pain).  09/15/13  Yes Historical Provider, MD  ibuprofen (ADVIL,MOTRIN) 800 MG tablet Take 800 mg by mouth every 6 (six) hours as needed (for pain).  08/30/13  Yes Historical Provider, MD  LINZESS 145 MCG CAPS capsule Take 145 mcg by mouth daily. 09/15/13  Yes  Historical Provider, MD  metFORMIN (GLUCOPHAGE) 1000 MG tablet Take 1,000 mg by mouth 2 (two) times daily with a meal.     Yes Historical Provider, MD  omeprazole (PRILOSEC) 20 MG capsule Take 20 mg by mouth 2 (two) times daily as needed (for reflux).    Yes Historical Provider, MD  potassium chloride (K-DUR) 10 MEQ tablet Take 10 mEq by mouth 2 (two) times daily.  08/31/13  Yes Historical Provider, MD  venlafaxine XR (EFFEXOR-XR) 75 MG 24 hr capsule Take 75 mg by mouth daily. 12/16/13  Yes Historical Provider, MD   BP 119/77  Pulse 105  Temp(Src) 98.3 F (36.8 C) (Oral)  Resp 19  SpO2 96% Physical Exam  Nursing note and vitals reviewed. Constitutional: She is oriented to person, place, and time. She appears well-developed and well-nourished. No distress.  HENT:  Head: Normocephalic and atraumatic.  Mouth/Throat: Oropharynx is clear and moist. No oropharyngeal exudate.  Eyes: Conjunctivae and EOM are normal. Pupils are equal, round, and reactive to light. No scleral icterus.  Neck: Normal range of motion. Neck supple. No JVD present. No thyromegaly present.  Cardiovascular: Normal rate, regular rhythm, normal heart sounds and intact distal pulses.  Exam reveals no gallop and no friction rub.   No murmur heard. Pulmonary/Chest: Effort normal and breath sounds normal. No respiratory distress. She has no wheezes. She has no rales. She exhibits no tenderness.  Abdominal: Soft. Normal appearance and bowel sounds are normal. She exhibits no distension and no mass. There is tenderness in the right upper quadrant, right lower quadrant, suprapubic area and left lower quadrant. There is no rigidity, no rebound, no guarding, no CVA tenderness, no tenderness at McBurney's point and negative Murphy's sign. No hernia.  Musculoskeletal: Normal range of motion.  Lymphadenopathy:    She has no cervical adenopathy.  Neurological: She is alert and oriented to person, place, and time.  Skin: Skin is warm and  dry. She is not diaphoretic.  Psychiatric: She has a normal mood and affect. Her behavior is normal. Judgment and thought content normal.    ED Course  Procedures (including critical care time) Labs Review Labs Reviewed  BASIC METABOLIC PANEL - Abnormal; Notable for the following:    Glucose, Bld 195 (*)    GFR calc non Af Amer 89 (*)    All other components within normal limits  CBC - Abnormal; Notable for the following:    RBC 5.25 (*)    All other components within normal limits  HEPATIC FUNCTION PANEL - Abnormal; Notable for the following:    Albumin 3.4 (*)    ALT 46 (*)    All other components within normal limits  PRO B NATRIURETIC PEPTIDE  D-DIMER, QUANTITATIVE  LIPASE, BLOOD  URINALYSIS, ROUTINE W REFLEX MICROSCOPIC  I-STAT TROPOININ, ED    Imaging Review Dg Chest 2 View  12/30/2013   CLINICAL DATA:  63 year old female with shortness of breath and left-sided weakness.  EXAM: CHEST  2 VIEW  COMPARISON:  03/23/2006 and prior chest radiographs dating back to 11/22/2003  FINDINGS: The cardiomediastinal silhouette is unremarkable.  Mild elevation of the right hemidiaphragm is unchanged.  There is no evidence of focal airspace disease, pulmonary edema, suspicious pulmonary nodule/mass, pleural effusion, or pneumothorax. No acute bony abnormalities are identified.  Thoracic spine DISH changes again identified.  IMPRESSION: No active cardiopulmonary disease.   Electronically Signed   By: Laveda Abbe M.D.   On: 12/30/2013 13:35   Dg Abd 2 Views  12/30/2013   CLINICAL DATA:  63 year old female with abdominal pain.  EXAM: ABDOMEN - 2 VIEW  COMPARISON:  09/07/2013  FINDINGS: Bowel gas pattern is unremarkable.  There is no evidence of bowel obstruction or pneumoperitoneum.  No suspicious calcifications or acute bony abnormalities noted.  Cholecystectomy clips are present.  IMPRESSION: No acute abnormalities.  Unremarkable bowel gas pattern.   Electronically Signed   By: Laveda Abbe M.D.   On:  12/30/2013 16:38     EKG Interpretation   Date/Time:  Saturday December 30 2013 12:24:46 EDT Ventricular Rate:  118 PR Interval:  150 QRS Duration: 66 QT Interval:  320 QTC Calculation: 448 R Axis:   26 Text Interpretation:  Sinus tachycardia Otherwise normal ECG No  significant change was found Confirmed by Manus Gunning  MD, STEPHEN ([id number]) on  12/30/2013 1:09:08 PM  MDM   Final diagnoses:  None   Labs Reviewed  BASIC METABOLIC PANEL - Abnormal; Notable for the following:    Glucose, Bld 195 (*)    GFR calc non Af Amer 89 (*)    All other components within normal limits  CBC - Abnormal; Notable for the following:    RBC 5.25 (*)    All other components within normal limits  HEPATIC FUNCTION PANEL - Abnormal; Notable for the following:    Albumin 3.4 (*)    ALT 46 (*)    All other components within normal limits  PRO B NATRIURETIC PEPTIDE  D-DIMER, QUANTITATIVE  LIPASE, BLOOD  URINALYSIS, ROUTINE W REFLEX MICROSCOPIC  I-STAT TROPOININ, ED   Patient  Is a 63 y.o. Female who presents to the ED with abdominal pain and SOB.  Patient has been seen for the same abdominal pain on 09/19/13.  CT abdomen and pelvis at that time showed no acute abnormality.  Patient had CBC, CMP, BNP, D-dimer, Lipase, abdominal xray, UA, and troponin performed here today.  CBC has no acute abnormalities at this time and shows no leukocytosis.  CMP show mild hypoalbuminemia and mild elevation in ALT.  Lipase is WNL.  Abdominal xray shows normal gas patterns and no evidence of SBO at this time.  CXR is negative.  D-Dimer is WNL.  UA shows no evidence of infection at this time.  Troponin is negative.  BNP is negative. CXR is negative.  There is no evident cause of GI pain or shortness of breath at this time.  Suspect that shortness of breath is secondary to obesity and also splinting from abdominal pain.  Differential still includes chronic pancreatitis at this time.  I will refer the patient to gastroenterology  at this time.  Patient is stable for discharge at this time. Patient was seen by Dr. Manus Gunningancour also who agrees with the above plan.  Patient will be given 2 days of oxycodone at this time for pain relief.  She was told to follow-up with her PCP for further pain management and to make an appointment with GI.  She states understanding and agreement.  Patient was told to return for uncontrollable pain, fever, RLQ abdominal pain, and bleeding per rectum.  She states understanding.  Patient was treated her with dilaudid prior to discharge.      Eben Burowourtney A Forcucci, PA-C 12/30/13 1733

## 2013-12-30 NOTE — Discharge Instructions (Signed)
Abdominal Pain, Women °Abdominal (stomach, pelvic, or belly) pain can be caused by many things. It is important to tell your doctor: °· The location of the pain. °· Does it come and go or is it present all the time? °· Are there things that start the pain (eating certain foods, exercise)? °· Are there other symptoms associated with the pain (fever, nausea, vomiting, diarrhea)? °All of this is helpful to know when trying to find the cause of the pain. °CAUSES  °· Stomach: virus or bacteria infection, or ulcer. °· Intestine: appendicitis (inflamed appendix), regional ileitis (Crohn's disease), ulcerative colitis (inflamed colon), irritable bowel syndrome, diverticulitis (inflamed diverticulum of the colon), or cancer of the stomach or intestine. °· Gallbladder disease or stones in the gallbladder. °· Kidney disease, kidney stones, or infection. °· Pancreas infection or cancer. °· Fibromyalgia (pain disorder). °· Diseases of the female organs: °¨ Uterus: fibroid (non-cancerous) tumors or infection. °¨ Fallopian tubes: infection or tubal pregnancy. °¨ Ovary: cysts or tumors. °¨ Pelvic adhesions (scar tissue). °¨ Endometriosis (uterus lining tissue growing in the pelvis and on the pelvic organs). °¨ Pelvic congestion syndrome (female organs filling up with blood just before the menstrual period). °¨ Pain with the menstrual period. °¨ Pain with ovulation (producing an egg). °¨ Pain with an IUD (intrauterine device, birth control) in the uterus. °¨ Cancer of the female organs. °· Functional pain (pain not caused by a disease, may improve without treatment). °· Psychological pain. °· Depression. °DIAGNOSIS  °Your doctor will decide the seriousness of your pain by doing an examination. °· Blood tests. °· X-rays. °· Ultrasound. °· CT scan (computed tomography, special type of X-ray). °· MRI (magnetic resonance imaging). °· Cultures, for infection. °· Barium enema (dye inserted in the large intestine, to better view it with  X-rays). °· Colonoscopy (looking in intestine with a lighted tube). °· Laparoscopy (minor surgery, looking in abdomen with a lighted tube). °· Major abdominal exploratory surgery (looking in abdomen with a large incision). °TREATMENT  °The treatment will depend on the cause of the pain.  °· Many cases can be observed and treated at home. °· Over-the-counter medicines recommended by your caregiver. °· Prescription medicine. °· Antibiotics, for infection. °· Birth control pills, for painful periods or for ovulation pain. °· Hormone treatment, for endometriosis. °· Nerve blocking injections. °· Physical therapy. °· Antidepressants. °· Counseling with a psychologist or psychiatrist. °· Minor or major surgery. °HOME CARE INSTRUCTIONS  °· Do not take laxatives, unless directed by your caregiver. °· Take over-the-counter pain medicine only if ordered by your caregiver. Do not take aspirin because it can cause an upset stomach or bleeding. °· Try a clear liquid diet (broth or water) as ordered by your caregiver. Slowly move to a bland diet, as tolerated, if the pain is related to the stomach or intestine. °· Have a thermometer and take your temperature several times a day, and record it. °· Bed rest and sleep, if it helps the pain. °· Avoid sexual intercourse, if it causes pain. °· Avoid stressful situations. °· Keep your follow-up appointments and tests, as your caregiver orders. °· If the pain does not go away with medicine or surgery, you may try: °¨ Acupuncture. °¨ Relaxation exercises (yoga, meditation). °¨ Group therapy. °¨ Counseling. °SEEK MEDICAL CARE IF:  °· You notice certain foods cause stomach pain. °· Your home care treatment is not helping your pain. °· You need stronger pain medicine. °· You want your IUD removed. °· You feel faint or   lightheaded. °· You develop nausea and vomiting. °· You develop a rash. °· You are having side effects or an allergy to your medicine. °SEEK IMMEDIATE MEDICAL CARE IF:  °· Your  pain does not go away or gets worse. °· You have a fever. °· Your pain is felt only in portions of the abdomen. The right side could possibly be appendicitis. The left lower portion of the abdomen could be colitis or diverticulitis. °· You are passing blood in your stools (bright red or black tarry stools, with or without vomiting). °· You have blood in your urine. °· You develop chills, with or without a fever. °· You pass out. °MAKE SURE YOU:  °· Understand these instructions. °· Will watch your condition. °· Will get help right away if you are not doing well or get worse. °Document Released: 03/22/2007 Document Revised: 10/09/2013 Document Reviewed: 04/11/2009 °[employer]® Patient Information ©2015 [employer]. This information is not intended to replace advice given to you by your health care provider. Make sure you discuss any questions you have with your health care provider. ° °

## 2013-12-30 NOTE — ED Notes (Signed)
Pt in c/o abd pain over the last few months, shortness of breath started a few days ago, states it is worse with exertion, denies other symptoms with that

## 2013-12-30 NOTE — ED Provider Notes (Signed)
Medical screening examination/treatment/procedure(s) were conducted as a shared visit with non-physician practitioner(s) and myself.  I personally evaluated the patient during the encounter.  Ongoing R sided abdominal pain with previous ED evaluations for same. No fever, vomiting, urinary symptoms.  TTP diffusely without peritoneal signs.  Intact distal pulses. CT 4/15 reviewed. No AAA. Mild tachycardic on exam. HR 105 on my exam. No chest pain.  Check D-dimer given complaint of SOB.  No hypoxia.     EKG Interpretation   Date/Time:  Saturday December 30 2013 12:24:46 EDT Ventricular Rate:  118 PR Interval:  150 QRS Duration: 66 QT Interval:  320 QTC Calculation: 448 R Axis:   26 Text Interpretation:  Sinus tachycardia Otherwise normal ECG No  significant change was found Confirmed by Manus GunningANCOUR  MD, Lataja Newland [phone]([id number]) on  12/30/2013 1:09:08 PM       Lindsay OctaveStephen Nickisha Hum, MD 12/30/13 [id number]

## 2014-02-14 ENCOUNTER — Encounter: Payer: Self-pay | Admitting: *Deleted

## 2014-02-28 ENCOUNTER — Other Ambulatory Visit: Payer: Self-pay | Admitting: Obstetrics and Gynecology

## 2014-02-28 ENCOUNTER — Other Ambulatory Visit (HOSPITAL_COMMUNITY)
Admission: RE | Admit: 2014-02-28 | Discharge: 2014-02-28 | Disposition: A | Discharge status: Home / Self Care | Payer: PRIVATE HEALTH INSURANCE | Source: Ambulatory Visit | Attending: Obstetrics and Gynecology | Admitting: Obstetrics and Gynecology

## 2014-02-28 DIAGNOSIS — Z1151 Encounter for screening for human papillomavirus (HPV): Secondary | ICD-10-CM | POA: Insufficient documentation

## 2014-02-28 DIAGNOSIS — Z124 Encounter for screening for malignant neoplasm of cervix: Secondary | ICD-10-CM | POA: Diagnosis present

## 2014-03-05 LAB — CYTOLOGY - PAP

## 2014-03-13 ENCOUNTER — Other Ambulatory Visit: Payer: Self-pay | Admitting: Obstetrics and Gynecology

## 2014-05-09 ENCOUNTER — Ambulatory Visit (INDEPENDENT_AMBULATORY_CARE_PROVIDER_SITE_OTHER): Payer: Medicare Other | Admitting: Family

## 2014-05-09 ENCOUNTER — Encounter: Payer: Self-pay | Admitting: Family

## 2014-05-09 ENCOUNTER — Other Ambulatory Visit (INDEPENDENT_AMBULATORY_CARE_PROVIDER_SITE_OTHER): Payer: Medicare Other

## 2014-05-09 VITALS — BP 160/98 | HR 115 | Temp 98.0°F | Resp 18 | Ht 65.0 in | Wt 217.0 lb

## 2014-05-09 DIAGNOSIS — E1165 Type 2 diabetes mellitus with hyperglycemia: Secondary | ICD-10-CM

## 2014-05-09 DIAGNOSIS — M545 Low back pain, unspecified: Secondary | ICD-10-CM | POA: Insufficient documentation

## 2014-05-09 DIAGNOSIS — IMO0002 Reserved for concepts with insufficient information to code with codable children: Secondary | ICD-10-CM

## 2014-05-09 LAB — HEMOGLOBIN A1C: Hgb A1c MFr Bld: 8.6 % — ABNORMAL HIGH (ref 4.6–6.5)

## 2014-05-09 NOTE — Progress Notes (Signed)
Subjective:    Patient ID: Lindsay PonderSylvia Georgiades, female    DOB: [birth year], 63 y.o.   MRN: [id number]  Chief Complaint  Patient presents with  . Establish Care    chronic back pain    HPI:  Lindsay PonderSylvia Kobler is a 63 y.o. female who presents today to establish care and discuss her back pain.   1) Diabetes - States it is out of control and has had problem with it. Maintained on metformin, however there are concerns of potential GI symptoms and also taking glipizide. Was previously on sliding scale insulin, but is not currently taking. Due for next diabetic eye exam - appointment will be scheduled by patient. Describes changes in vision (blurriness) and some neuropathy.   Lab Results  Component Value Date   HGBA1C 10.8 11/21/2009   2) Back pain - Chiropractor took an x-ray and showed her that she had a "pinched nerve." Pain in lower back and is described as hot and stabbing and the intensity if 9/10. Pain medication being provided by pain clinic. Left leg radiculopathy.  Allergies  Allergen Reactions  . Atorvastatin     REACTION: unable to tolerate. reason unknown  . Clopidogrel Bisulfate     REACTION: weak, tired, sleepy, tightness (6/05); soreness in hip (11/05)  . Fexofenadine     REACTION: intolerance unknown  . Furosemide     REACTION: did not tolerate and preferred HCTZ  . Hydrochlorothiazide W-Triamterene     REACTION: things crawled in her head and felt funny(10/04) ; h/a (3/07)  . Lisinopril     REACTION: cough, right sided pain  . Metoprolol Tartrate     REACTION: lightheadedness, h/a cough (7/04); heart gong to stop (7/06); sluggishnes and depressed feeling (2/07); weakness/heaviness 6/07)  . Mometasone Furoate     REACTION: neck tenderness   Current Outpatient Prescriptions on File Prior to Visit  Medication Sig Dispense Refill  . albuterol (PROAIR HFA) 108 (90 BASE) MCG/ACT inhaler Inhale 2 puffs into the lungs every 4 (four) hours as needed for shortness of breath.       Marland Kitchen. amLODipine (NORVASC) 10 MG tablet Take 10 mg by mouth daily.      Marland Kitchen. glipiZIDE (GLUCOTROL) 5 MG tablet Take 10 mg by mouth 2 (two) times daily before a meal.     . hydrochlorothiazide 25 MG tablet Take 25 mg by mouth daily.      Marland Kitchen. HYDROcodone-acetaminophen (NORCO/VICODIN) 5-325 MG per tablet Take 1 tablet by mouth 3 (three) times daily as needed (for pain).     Marland Kitchen. ibuprofen (ADVIL,MOTRIN) 800 MG tablet Take 800 mg by mouth every 6 (six) hours as needed (for pain).     Marland Kitchen. LINZESS 145 MCG CAPS capsule Take 145 mcg by mouth daily.    . metFORMIN (GLUCOPHAGE) 1000 MG tablet Take 1,000 mg by mouth 2 (two) times daily with a meal.      . omeprazole (PRILOSEC) 20 MG capsule Take 20 mg by mouth 2 (two) times daily as needed (for reflux).     . potassium chloride (K-DUR) 10 MEQ tablet Take 10 mEq by mouth 2 (two) times daily.     Marland Kitchen. venlafaxine XR (EFFEXOR-XR) 75 MG 24 hr capsule Take 75 mg by mouth daily.     No current facility-administered medications on file prior to visit.   Past Medical History  Diagnosis Date  . CAD (coronary artery disease) 11/2003     RCA 60% stenosis, LAD diffuse  . CVA (cerebrovascular accident) 2001  right frontoparietal cortical CVA - MRI September 2001  . Asthma   . Diabetes mellitus   . GERD (gastroesophageal reflux disease)      EGD June 2004 -  followed by Dr. Leone PayorGessner  . Hyperlipidemia   . Hypertension   . OA (osteoarthritis)      DJP coracoclavicular ligament, left patella osteophytes June 2002, L4-S1 spondylosis, degenerative disc disease with bulge. I in October 2001, the C2-C4 spondylosis without stenosis and with spurs based on x-ray July 2005; diffuse idiopathic skeletal hyperostosis with bilateral hip lumbar degenerative changes   on x-ray February 2005, bilateral plantar calcaneal spurs  . Diastolic dysfunction      ejection fraction 55-65% on 2-D echo May 2006  . Empty sella syndrome      based on MRI September 2001, related to obesity, hypertension,  it was determined that patient has not required treatment but will be monitored with TSH, ACTH, cortisol, testosterone, prolactin, growth hormone  . NAFLD (nonalcoholic fatty liver disease)      based on ultrasound June 2002, elevated transaminase  . Reactive airway disease      peak flow 180 in May 2005, chronic sinusitis with PND, bronchitis December 2002 and strep pneumonia April 2007,  FEC 66, MCV 60, ratio is 69, DLCO 57,  moderate restrictive and mild obstructive disease  on spirometry May 2005  . Granulomatous disease, chronic      in right upper lobe per x-ray December 2005  . Chicken pox    Review of Systems    See HPI Objective:    BP 160/98 mmHg  Pulse 115  Temp(Src) 98 F (36.7 C) (Oral)  Resp 18  Ht 5\' 5"  (1.651 m)  Wt 217 lb (98.431 kg)  BMI 36.11 kg/m2  SpO2 96% Nursing note and vital signs reviewed.  Physical Exam  Constitutional: She is oriented to person, place, and time. She appears well-developed and well-nourished. No distress.  Obese female seated in the chair. Dress appropriately; appears her stated age.  Cardiovascular: Normal rate, regular rhythm, normal heart sounds and intact distal pulses.   Pulmonary/Chest: Effort normal and breath sounds normal.  Musculoskeletal:  No obvious deformity, discoloration, or edema of lower back noted. Tenderness elicited over lower lumbar spine. Unable to reproduce radiating pain into lower extremities as described previously. Range of motion is limited secondary to pain.  Neurological: She is alert and oriented to person, place, and time.  Skin: Skin is warm and dry.  Psychiatric: She has a normal mood and affect. Her behavior is normal. Judgment and thought content normal.       Assessment & Plan:

## 2014-05-09 NOTE — Patient Instructions (Addendum)
Thank you for choosing [facility].  Summary/Instructions:  Please stop by the lab on the basement level of the building for your blood work. Your results will be released to MyChart (or called to you) after review, usually within 72hours after test completion. If any changes need to be made, you will be notified at that same time.  Type 2 Diabetes Mellitus Type 2 diabetes mellitus, often simply referred to as type 2 diabetes, is a long-lasting (chronic) disease. In type 2 diabetes, the pancreas does not make enough insulin (a hormone), the cells are less responsive to the insulin that is made (insulin resistance), or both. Normally, insulin moves sugars from food into the tissue cells. The tissue cells use the sugars for energy. The lack of insulin or the lack of normal response to insulin causes excess sugars to build up in the blood instead of going into the tissue cells. As a result, high blood sugar (hyperglycemia) develops. The effect of high sugar (glucose) levels can cause many complications. Type 2 diabetes was also previously called adult-onset diabetes, but it can occur at any age.  RISK FACTORS  A person is predisposed to developing type 2 diabetes if someone in the family has the disease and also has one or more of the following primary risk factors:  Overweight.  An inactive lifestyle.  A history of consistently eating high-calorie foods. Maintaining a normal weight and regular physical activity can reduce the chance of developing type 2 diabetes. SYMPTOMS  A person with type 2 diabetes may not show symptoms initially. The symptoms of type 2 diabetes appear slowly. The symptoms include:  Increased thirst (polydipsia).  Increased urination (polyuria).  Increased urination during the night (nocturia).  Weight loss. This weight loss may be rapid.  Frequent, recurring infections.  Tiredness (fatigue).  Weakness.  Vision changes, such as blurred vision.  Fruity  smell to your breath.  Abdominal pain.  Nausea or vomiting.  Cuts or bruises which are slow to heal.  Tingling or numbness in the hands or feet. DIAGNOSIS Type 2 diabetes is frequently not diagnosed until complications of diabetes are present. Type 2 diabetes is diagnosed when symptoms or complications are present and when blood glucose levels are increased. Your blood glucose level may be checked by one or more of the following blood tests:  A fasting blood glucose test. You will not be allowed to eat for at least 8 hours before a blood sample is taken.  A random blood glucose test. Your blood glucose is checked at any time of the day regardless of when you ate.  A hemoglobin A1c blood glucose test. A hemoglobin A1c test provides information about blood glucose control over the previous 3 months.  An oral glucose tolerance test (OGTT). Your blood glucose is measured after you have not eaten (fasted) for 2 hours and then after you drink a glucose-containing beverage. TREATMENT   You may need to take insulin or diabetes medicine daily to keep blood glucose levels in the desired range.  If you use insulin, you may need to adjust the dosage depending on the carbohydrates that you eat with each meal or snack. The treatment goal is to maintain the before meal blood sugar (preprandial glucose) level at 70-130 mg/dL. HOME CARE INSTRUCTIONS   Have your hemoglobin A1c level checked twice a year.  Perform daily blood glucose monitoring as directed by your health care provider.  Monitor urine ketones when you are ill and as directed by your health care  provider.  Take your diabetes medicine or insulin as directed by your health care provider to maintain your blood glucose levels in the desired range.  Never run out of diabetes medicine or insulin. It is needed every day.  If you are using insulin, you may need to adjust the amount of insulin given based on your intake of carbohydrates.  Carbohydrates can raise blood glucose levels but need to be included in your diet. Carbohydrates provide vitamins, minerals, and fiber which are an essential part of a healthy diet. Carbohydrates are found in fruits, vegetables, whole grains, dairy products, legumes, and foods containing added sugars.  Eat healthy foods. You should make an appointment to see a registered dietitian to help you create an eating plan that is right for you.  Lose weight if you are overweight.  Carry a medical alert card or wear your medical alert jewelry.  Carry a 15-gram carbohydrate snack with you at all times to treat low blood glucose (hypoglycemia). Some examples of 15-gram carbohydrate snacks include:  Glucose tablets, 3 or 4.  Glucose gel, 15-gram tube.  Raisins, 2 tablespoons (24 grams).  Jelly beans, 6.  Animal crackers, 8.  Regular pop, 4 ounces (120 mL).  Gummy treats, 9.  Recognize hypoglycemia. Hypoglycemia occurs with blood glucose levels of 70 mg/dL and below. The risk for hypoglycemia increases when fasting or skipping meals, during or after intense exercise, and during sleep. Hypoglycemia symptoms can include:  Tremors or shakes.  Decreased ability to concentrate.  Sweating.  Increased heart rate.  Headache.  Dry mouth.  Hunger.  Irritability.  Anxiety.  Restless sleep.  Altered speech or coordination.  Confusion.  Treat hypoglycemia promptly. If you are alert and able to safely swallow, follow the 15:15 rule:  Take 15-20 grams of rapid-acting glucose or carbohydrate. Rapid-acting options include glucose gel, glucose tablets, or 4 ounces (120 mL) of fruit juice, regular soda, or low-fat milk.  Check your blood glucose level 15 minutes after taking the glucose.  Take 15-20 grams more of glucose if the repeat blood glucose level is still 70 mg/dL or below.  Eat a meal or snack within 1 hour once blood glucose levels return to normal.  Be alert to feeling very  thirsty and urinating more frequently than usual, which are early signs of hyperglycemia. An early awareness of hyperglycemia allows for prompt treatment. Treat hyperglycemia as directed by your health care provider.  Engage in at least 150 minutes of moderate-intensity physical activity a week, spread over at least 3 days of the week or as directed by your health care provider. In addition, you should engage in resistance exercise at least 2 times a week or as directed by your health care provider. Try to spend no more than 90 minutes at one time inactive.  Adjust your medicine and food intake as needed if you start a new exercise or sport.  Follow your sick-day plan anytime you are unable to eat or drink as usual.  Do not use any tobacco products including cigarettes, chewing tobacco, or electronic cigarettes. If you need help quitting, ask your health care provider.  Limit alcohol intake to no more than 1 drink per day for nonpregnant women and 2 drinks per day for men. You should drink alcohol only when you are also eating food. Talk with your health care provider whether alcohol is safe for you. Tell your health care provider if you drink alcohol several times a week.  Keep all follow-up visits as directed  by your health care provider. This is important.  Schedule an eye exam soon after the diagnosis of type 2 diabetes and then annually.  Perform daily skin and foot care. Examine your skin and feet daily for cuts, bruises, redness, nail problems, bleeding, blisters, or sores. A foot exam by a health care provider should be done annually.  Brush your teeth and gums at least twice a day and floss at least once a day. Follow up with your dentist regularly.  Share your diabetes management plan with your workplace or school.  Stay up-to-date with immunizations. It is recommended that people with diabetes who are over 65 years old get the pneumonia vaccine. In some cases, two separate shots may  be given. Ask your health care provider if your pneumonia vaccination is up-to-date.  Learn to manage stress.  Obtain ongoing diabetes education and support as needed.  Participate in or seek rehabilitation as needed to maintain or improve independence and quality of life. Request a physical or occupational therapy referral if you are having foot or hand numbness, or difficulties with grooming, dressing, eating, or physical activity. SEEK MEDICAL CARE IF:   You are unable to eat food or drink fluids for more than 6 hours.  You have nausea and vomiting for more than 6 hours.  Your blood glucose level is over 240 mg/dL.  There is a change in mental status.  You develop an additional serious illness.  You have diarrhea for more than 6 hours.  You have been sick or have had a fever for a couple of days and are not getting better.  You have pain during any physical activity.  SEEK IMMEDIATE MEDICAL CARE IF:  You have difficulty breathing.  You have moderate to large ketone levels. MAKE SURE YOU:  Understand these instructions.  Will watch your condition.  Will get help right away if you are not doing well or get worse. Document Released: 05/25/2005 Document Revised: 10/09/2013 Document Reviewed: 12/22/2011 [facility] Patient Information 2015 [employer] [place], [place]. This information is not intended to replace advice given to you by your health care provider. Make sure you discuss any questions you have with your health care provider.

## 2014-05-09 NOTE — Assessment & Plan Note (Signed)
Obtain A1c and basic metabolic panel. Continue metformin and glipizide at this time. Will reevaluate in one week to determine diabetes control. Will consider discontinuing metformin at that time pending results per patient's request. Patient will be scheduling her diabetic eye exam. Diabetic foot exam performed at next visit.

## 2014-05-09 NOTE — Assessment & Plan Note (Signed)
Symptoms and exam consistent with potential disc pathology. Patient has a ruddy completed an MRI of her lumbar spine and will bring to the office at her next office visit. Continue current pain medications as needed at this time. Consider referral or further evaluation pending MRI results.

## 2014-05-09 NOTE — Progress Notes (Signed)
Pre visit review using our clinic review tool, if applicable. No additional management support is needed unless otherwise documented below in the visit note. 

## 2014-05-10 ENCOUNTER — Telehealth: Payer: Self-pay | Admitting: Family

## 2014-05-10 DIAGNOSIS — E876 Hypokalemia: Secondary | ICD-10-CM

## 2014-05-10 LAB — BASIC METABOLIC PANEL
BUN: 8 mg/dL (ref 6–23)
CO2: 29 meq/L (ref 19–32)
CREATININE: 0.8 mg/dL (ref 0.4–1.2)
Calcium: 9 mg/dL (ref 8.4–10.5)
Chloride: 99 mEq/L (ref 96–112)
GFR: 98.24 mL/min (ref >60.00)
GLUCOSE: 177 mg/dL — AB (ref 70–99)
Potassium: 2.8 mEq/L — CL (ref 3.5–5.1)
Sodium: 137 mEq/L (ref 135–145)

## 2014-05-10 MED ORDER — POTASSIUM CHLORIDE CRYS ER 20 MEQ PO TBCR: EXTENDED_RELEASE_TABLET | ORAL | Status: DC

## 2014-05-10 NOTE — Telephone Encounter (Signed)
Please call patient and inform her that her potassium is low. I have sent potassium supplements to her pharmacy. Please have her take 2 tablets twice a day for the for 3 days and then 1 tablet daily. Please have her come back on Monday or Tuesday for a recheck of her potassium level.

## 2014-05-10 NOTE — Telephone Encounter (Signed)
I let pt know that her potassium level was low and to go to pharmacy to pick up rx. She is aware to come back to get blood work rechecked Monday or Tuesday of next week.

## 2014-05-14 ENCOUNTER — Telehealth: Payer: Self-pay | Admitting: Family

## 2014-05-14 ENCOUNTER — Other Ambulatory Visit (INDEPENDENT_AMBULATORY_CARE_PROVIDER_SITE_OTHER): Payer: Medicare Other

## 2014-05-14 DIAGNOSIS — E876 Hypokalemia: Secondary | ICD-10-CM

## 2014-05-14 LAB — POTASSIUM: Potassium: 3.8 mEq/L (ref 3.5–5.1)

## 2014-05-14 NOTE — Telephone Encounter (Signed)
Pt is aware that her potassium is normal. She asked if she could take 10mg  instead of 20mg  because the potassium is making her cramp so bad. Please advise.

## 2014-05-14 NOTE — Telephone Encounter (Signed)
Sheikh and divided the tablet in half and take 10 in the morning and 10 in the evening if that makes it easier. She can also try taking it with food which should help. That does not work please let me know.

## 2014-05-14 NOTE — Telephone Encounter (Signed)
Please call if our patient that her potassium level is now 3.8 which is normal. Please continue to take 20 mEq of potassium a daily basis.

## 2014-05-15 NOTE — Telephone Encounter (Signed)
Pt was told she could take half in the morning and half in the evening. She was advised to call if she had anymore problems.

## 2014-05-16 ENCOUNTER — Encounter: Payer: Self-pay | Admitting: Family

## 2014-05-16 ENCOUNTER — Ambulatory Visit (INDEPENDENT_AMBULATORY_CARE_PROVIDER_SITE_OTHER): Payer: Medicare Other | Admitting: Family

## 2014-05-16 VITALS — BP 140/98 | HR 112 | Temp 98.2°F | Resp 18 | Ht 65.0 in | Wt 217.0 lb

## 2014-05-16 DIAGNOSIS — M545 Low back pain: Secondary | ICD-10-CM

## 2014-05-16 DIAGNOSIS — E1165 Type 2 diabetes mellitus with hyperglycemia: Secondary | ICD-10-CM | POA: Diagnosis not present

## 2014-05-16 DIAGNOSIS — IMO0002 Reserved for concepts with insufficient information to code with codable children: Secondary | ICD-10-CM

## 2014-05-16 MED ORDER — LINAGLIPTIN 5 MG PO TABS
5.0000 mg | ORAL_TABLET | Freq: Every day | ORAL | Status: DC
Start: 2014-05-16 — End: 2014-07-30

## 2014-05-16 NOTE — Progress Notes (Signed)
Pre visit review using our clinic review tool, if applicable. No additional management support is needed unless otherwise documented below in the visit note. 

## 2014-05-16 NOTE — Progress Notes (Signed)
Subjective:    Patient ID: Lindsay Hardy, female    DOB: 22-May-1951, 63 y.o.   MRN: [id number]  Chief Complaint  Patient presents with  . Follow-up    she said she thinks her diabetes are under controll but does not check BS often, still having cramps from potassium    HPI:  Lindsay Hardy is a 63 y.o. female who presents today for follow up.  1) Diabetes - currently maintained on metformin and glipizide.Continues to indicate that the metformin causes her gastrointestinal distress. Denies any changes since previous visit. Acknowledges need to improve her diet to help control her diabetes.  Lab Results  Component Value Date   HGBA1C 8.6* 05/09/2014    2) Hypokalemia - previous potassium levels were 2.8. Was started on potassium and new reading was at 3.8. Describes stomach upset with 20 mEq pills and would like to switch to 10 mEq.   Lab Results  Component Value Date   K 3.8 05/14/2014   Allergies  Allergen Reactions  . Atorvastatin     REACTION: unable to tolerate. reason unknown  . Clopidogrel Bisulfate     REACTION: weak, tired, sleepy, tightness (6/05); soreness in hip (11/05)  . Fexofenadine     REACTION: intolerance unknown  . Furosemide     REACTION: did not tolerate and preferred HCTZ  . Hydrochlorothiazide W-Triamterene     REACTION: things crawled in her head and felt funny(10/04) ; h/a (3/07)  . Lisinopril     REACTION: cough, right sided pain  . Metoprolol Tartrate     REACTION: lightheadedness, h/a cough (7/04); heart gong to stop (7/06); sluggishnes and depressed feeling (2/07); weakness/heaviness 6/07)  . Mometasone Furoate     REACTION: neck tenderness   Current Outpatient Prescriptions on File Prior to Visit  Medication Sig Dispense Refill  . albuterol (PROAIR HFA) 108 (90 BASE) MCG/ACT inhaler Inhale 2 puffs into the lungs every 4 (four) hours as needed for shortness of breath.     Marland Kitchen. amLODipine (NORVASC) 10 MG tablet Take 10 mg by mouth daily.        Marland Kitchen. glipiZIDE (GLUCOTROL) 5 MG tablet Take 10 mg by mouth 2 (two) times daily before a meal.     . hydrochlorothiazide 25 MG tablet Take 25 mg by mouth daily.      Marland Kitchen. HYDROcodone-acetaminophen (NORCO/VICODIN) 5-325 MG per tablet Take 1 tablet by mouth 3 (three) times daily as needed (for pain).     Marland Kitchen. ibuprofen (ADVIL,MOTRIN) 800 MG tablet Take 800 mg by mouth every 6 (six) hours as needed (for pain).     Marland Kitchen. LINZESS 145 MCG CAPS capsule Take 145 mcg by mouth daily.    Marland Kitchen. omeprazole (PRILOSEC) 20 MG capsule Take 20 mg by mouth 2 (two) times daily as needed (for reflux).     . potassium chloride (K-DUR) 10 MEQ tablet Take 10 mEq by mouth 2 (two) times daily.     Marland Kitchen. venlafaxine XR (EFFEXOR-XR) 75 MG 24 hr capsule Take 75 mg by mouth daily.     No current facility-administered medications on file prior to visit.    Review of Systems    See HPI  Objective:    BP 140/98 mmHg  Pulse 112  Temp(Src) 98.2 F (36.8 C) (Oral)  Resp 18  Ht 5\' 5"  (1.651 m)  Wt 217 lb (98.431 kg)  BMI 36.11 kg/m2  SpO2 95% Nursing note and vital signs reviewed.  Physical Exam  Constitutional: She is oriented to person,  place, and time. She appears well-developed and well-nourished. No distress.  Cardiovascular: Normal rate, regular rhythm, normal heart sounds and intact distal pulses.   Pulmonary/Chest: Effort normal and breath sounds normal.  Musculoskeletal:  No change in lower back since previous exam.  Diabetic foot exam - bilateral feet appear without cuts or abrasions. Pulses are intact and appropriate bilaterally. Sensation is intact and appropriate to monofilament.  Neurological: She is alert and oriented to person, place, and time.  Skin: Skin is warm and dry.  Psychiatric: She has a normal mood and affect. Her behavior is normal. Judgment and thought content normal.       Assessment & Plan:

## 2014-05-16 NOTE — Assessment & Plan Note (Addendum)
Patient indicates continued upset stomach with metformin. Discontinue metformin at this time. Start linagliptin. Continue glipizide. Foot exam completed and normal. Diabetic eye exam has been scheduled for January. Follow up if symptoms worsen or fail to improve. Next A1c in 3 months.

## 2014-05-16 NOTE — Assessment & Plan Note (Addendum)
Continues to have low back pain. Refer to neurosurgery

## 2014-05-16 NOTE — Patient Instructions (Signed)
Thank you for choosing [facility].  Summary/Instructions:  Your prescription(s) have been submitted to your pharmacy. Please take as directed and contact our office if you believe you are having problem(s) with the medication(s).  If your symptoms worsen or fail to improve, please contact our office for further instruction, or in case of emergency go directly to the emergency room at the closest medical facility.   Linagliptin oral tablets What is this medicine? Linagliptin (lin a GLIP tin) helps to treat type 2 diabetes. It helps to control blood sugar. Treatment is combined with diet and exercise. This medicine may be used for other purposes; ask your health care provider or pharmacist if you have questions. COMMON BRAND NAME(S): Tradjenta What should I tell my health care provider before I take this medicine? They need to know if you have any of these conditions: -diabetic ketoacidosis -type 1 diabetes -an unusual or allergic reaction to linagliptin, other medicines, foods, dyes, or preservatives -pregnant or trying to get pregnant -breast-feeding How should I use this medicine? Take this medicine by mouth with a glass of water. Follow the directions on the prescription label. You can take it with or without food. Take your dose at the same time each day. Do not take more often than directed. Do not stop taking except on your doctor's advice. A special MedGuide will be given to you by the pharmacist with each prescription and refill. Be sure to read this information carefully each time. Talk to your pediatrician regarding the use of this medicine in children. Special care may be needed. Overdosage: If you think you've taken too much of this medicine contact a poison control center or emergency room at once. Overdosage: If you think you have taken too much of this medicine contact a poison control center or emergency room at once. NOTE: This medicine is only for you. Do not share  this medicine with others. What if I miss a dose? If you miss a dose, take it as soon as you can. If it is almost time for your next dose, take only that dose. Do not take double or extra doses. What may interact with this medicine? Do not take this medicine with any of the following medications: -gatifloxacin This medicine may also interact with the following medications: -alcohol -bosentan -certain medicines for seizures like carbamazepine, phenobarbital, phenytoin -rifabutin -rifampin -St. Johns Wort -sulfonylureas like glimepiride, glipizide, glyburide This list may not describe all possible interactions. Give your health care provider a list of all the medicines, herbs, non-prescription drugs, or dietary supplements you use. Also tell them if you smoke, drink alcohol, or use illegal drugs. Some items may interact with your medicine. What should I watch for while using this medicine? Visit your doctor or health care professional for regular checks on your progress. A test called the HbA1C (A1C) will be monitored. This is a simple blood test. It measures your blood sugar control over the last 2 to 3 months. You will receive this test every 3 to 6 months. Learn how to check your blood sugar. Learn the symptoms of low and high blood sugar and how to manage them. Always carry a quick-source of sugar with you in case you have symptoms of low blood sugar. Examples include hard sugar candy or glucose tablets. Make sure others know that you can choke if you eat or drink when you develop serious symptoms of low blood sugar, such as seizures or unconsciousness. They must get medical help at once. Tell  your doctor or health care professional if you have high blood sugar. You might need to change the dose of your medicine. If you are sick or exercising more than usual, you might need to change the dose of your medicine. Do not skip meals. Ask your doctor or health care professional if you should avoid  alcohol. Many nonprescription cough and cold products contain sugar or alcohol. These can affect blood sugar. Wear a medical ID bracelet or chain, and carry a card that describes your disease and details of your medicine and dosage times. What side effects may I notice from receiving this medicine? Side effects that you should report to your doctor or health care professional as soon as possible: -allergic reactions like skin rash, itching or hives, swelling of the face, lips, or tongue -breathing problems -fever, chills -nausea, vomiting -signs and symptoms of low blood sugar such as feeling anxious, confusion, dizziness, increased hunger, unusually weak or tired, sweating, shakiness, cold, irritable, headache, blurred vision, fast heartbeat, loss of consciousness -unusual stomach pain or discomfort -vomiting Side effects that usually do not require medical attention (Report these to your doctor or health care professional if they continue or are bothersome.): -headache -sore throat -stuffy or runny nose This list may not describe all possible side effects. Call your doctor for medical advice about side effects. You may report side effects to FDA at 1-800-FDA-1088. Where should I keep my medicine? Keep out of the reach of children. Store at room temperature between 15 and 30 degrees C (59 and 86 degrees F). Throw away any unused medicine after the expiration date. NOTE: This sheet is a summary. It may not cover all possible information. If you have questions about this medicine, talk to your doctor, pharmacist, or health care provider.  2015, Elsevier/Gold Standard. (2013-07-20 11:16:32)

## 2014-05-17 ENCOUNTER — Telehealth: Payer: Self-pay | Admitting: Family

## 2014-05-17 NOTE — Telephone Encounter (Signed)
Pt called in said that she picked up her meds and she has a few questions about it.  Requesting a call back    Best number [phone]

## 2014-05-17 NOTE — Telephone Encounter (Signed)
Called pt back. Pt stated that on a paper she received it shows medication interactions. The medication that you sent in which was tradgenta is shown to interact with her glipizide. She is just concerned about taking it. Please advise on what she should do.

## 2014-05-18 NOTE — Telephone Encounter (Signed)
Please call patient informed her of the medication interaction was potential low glucose. With her stopping metformin this should not be an issue. If she notices her blood sugars are trending lower, we can decrease the dose of the glipizide.

## 2014-05-21 NOTE — Telephone Encounter (Signed)
Informed pt .

## 2014-06-07 ENCOUNTER — Other Ambulatory Visit: Payer: Self-pay

## 2014-06-07 ENCOUNTER — Telehealth: Payer: Self-pay | Admitting: Family

## 2014-06-07 MED ORDER — IBUPROFEN 800 MG PO TABS
800.0000 mg | ORAL_TABLET | Freq: Four times a day (QID) | ORAL | Status: DC | PRN
Start: 2014-06-07 — End: 2014-07-12

## 2014-06-07 NOTE — Telephone Encounter (Signed)
Pt requesting refill of 800 mg ibuprofen, [facility].

## 2014-06-07 NOTE — Telephone Encounter (Signed)
Rx sent 

## 2014-06-12 ENCOUNTER — Other Ambulatory Visit: Payer: Self-pay | Admitting: Family

## 2014-06-19 DIAGNOSIS — Z961 Presence of intraocular lens: Secondary | ICD-10-CM | POA: Diagnosis not present

## 2014-06-19 DIAGNOSIS — H18412 Arcus senilis, left eye: Secondary | ICD-10-CM | POA: Diagnosis not present

## 2014-06-19 DIAGNOSIS — H18411 Arcus senilis, right eye: Secondary | ICD-10-CM | POA: Diagnosis not present

## 2014-06-26 ENCOUNTER — Ambulatory Visit (INDEPENDENT_AMBULATORY_CARE_PROVIDER_SITE_OTHER): Payer: Medicare Other | Admitting: Family

## 2014-06-26 ENCOUNTER — Encounter: Payer: Self-pay | Admitting: Family

## 2014-06-26 ENCOUNTER — Other Ambulatory Visit (INDEPENDENT_AMBULATORY_CARE_PROVIDER_SITE_OTHER): Payer: Medicare Other

## 2014-06-26 ENCOUNTER — Telehealth: Payer: Self-pay | Admitting: Family

## 2014-06-26 VITALS — BP 160/92 | HR 107 | Temp 97.7°F | Resp 18 | Ht 65.0 in | Wt 211.4 lb

## 2014-06-26 DIAGNOSIS — M545 Low back pain: Secondary | ICD-10-CM | POA: Diagnosis not present

## 2014-06-26 DIAGNOSIS — S8002XA Contusion of left knee, initial encounter: Secondary | ICD-10-CM | POA: Diagnosis not present

## 2014-06-26 DIAGNOSIS — I10 Essential (primary) hypertension: Secondary | ICD-10-CM

## 2014-06-26 LAB — BASIC METABOLIC PANEL
BUN: 9 mg/dL (ref 6–23)
CO2: 30 mEq/L (ref 19–32)
Calcium: 9.6 mg/dL (ref 8.4–10.5)
Chloride: 101 mEq/L (ref 96–112)
Creatinine, Ser: 0.73 mg/dL (ref 0.40–1.20)
GFR: 102.87 mL/min (ref >60.00)
Glucose, Bld: 160 mg/dL — ABNORMAL HIGH (ref 70–99)
Potassium: 3.6 mEq/L (ref 3.5–5.1)
Sodium: 138 mEq/L (ref 135–145)

## 2014-06-26 MED ORDER — METHOCARBAMOL 500 MG PO TABS: 500.0000 mg | ORAL_TABLET | Freq: Three times a day (TID) | ORAL | Status: DC | PRN

## 2014-06-26 MED ORDER — MELOXICAM 7.5 MG PO TABS: 7.5000 mg | ORAL_TABLET | Freq: Every day | ORAL | Status: DC

## 2014-06-26 NOTE — Telephone Encounter (Signed)
Please inform patient that her potassium level was 3.6 which is normal. Therefore I recommend that she continue to take the potassium supplement for now and we can attempt to wean her off if her number remains stable with the next lab draw.

## 2014-06-26 NOTE — Assessment & Plan Note (Signed)
Low back pain exacerbated by recent fall. Has mild amount of muscle spasm. Start Robaxin as needed for muscle spasm. Start meloxicam as needed for inflammation. Patient is still awaiting neurosurgery consult. Follow up if symptoms worsen or fail to improve.

## 2014-06-26 NOTE — Patient Instructions (Signed)
Thank you for choosing [facility].  Summary/Instructions:  Your prescription(s) have been submitted to your pharmacy or been printed and provided for you. Please take as directed and contact our office if you believe you are having problem(s) with the medication(s) or have any questions.  If your symptoms worsen or fail to improve, please contact our office for further instruction, or in case of emergency go directly to the emergency room at the closest medical facility.   Please take the muscle relaxor as needed for muscle spasm and the mobic for antiinflammatory.   Heat for your knee 20 min 2-3 days and keep moving!

## 2014-06-26 NOTE — Progress Notes (Signed)
Subjective:    Patient ID: Lindsay Hardy, female    DOB: [birth year], 64 y.o.   MRN: [id number]  Chief Complaint  Patient presents with  . Back Pain    Larey SeatFell a couple days after christmas and back is hurting worse, and left knee pain ever since    HPI:  Lindsay PonderSylvia Hardy is a 64 y.o. female who presents today for an acute visit.  Acute symptoms of left sided lower back pain described as tightness and sometimes sharp has been increased since Christmas when she fell at home. Indicates she also landed on her left knee which the pain is decribed as sharp. Has used a muscle rub on her knee but indicates that it doesn't help. Does have vicodin which does not seem to help a lot.   Allergies  Allergen Reactions  . Atorvastatin     REACTION: unable to tolerate. reason unknown  . Clopidogrel Bisulfate     REACTION: weak, tired, sleepy, tightness (6/05); soreness in hip (11/05)  . Fexofenadine     REACTION: intolerance unknown  . Furosemide     REACTION: did not tolerate and preferred HCTZ  . Hydrochlorothiazide W-Triamterene     REACTION: things crawled in her head and felt funny(10/04) ; h/a (3/07)  . Lisinopril     REACTION: cough, right sided pain  . Metoprolol Tartrate     REACTION: lightheadedness, h/a cough (7/04); heart gong to stop (7/06); sluggishnes and depressed feeling (2/07); weakness/heaviness 6/07)  . Mometasone Furoate     REACTION: neck tenderness    Current Outpatient Prescriptions on File Prior to Visit  Medication Sig Dispense Refill  . albuterol (PROAIR HFA) 108 (90 BASE) MCG/ACT inhaler Inhale 2 puffs into the lungs every 4 (four) hours as needed for shortness of breath.     Marland Kitchen. amLODipine (NORVASC) 10 MG tablet Take 10 mg by mouth daily.      Marland Kitchen. glipiZIDE (GLUCOTROL) 5 MG tablet Take 10 mg by mouth 2 (two) times daily before a meal.     . HYDROcodone-acetaminophen (NORCO/VICODIN) 5-325 MG per tablet Take 1 tablet by mouth 3 (three) times daily as needed (for pain).       Marland Kitchen. ibuprofen (ADVIL,MOTRIN) 800 MG tablet Take 1 tablet (800 mg total) by mouth every 6 (six) hours as needed (for pain). 30 tablet 0  . linagliptin (TRADJENTA) 5 MG TABS tablet Take 1 tablet (5 mg total) by mouth daily. 30 tablet 1  . LINZESS 145 MCG CAPS capsule Take 145 mcg by mouth daily.    Marland Kitchen. omeprazole (PRILOSEC) 20 MG capsule Take 20 mg by mouth 2 (two) times daily as needed (for reflux).     . potassium chloride (K-DUR) 10 MEQ tablet Take 10 mEq by mouth 2 (two) times daily.     Marland Kitchen. venlafaxine XR (EFFEXOR-XR) 75 MG 24 hr capsule Take 75 mg by mouth daily.     No current facility-administered medications on file prior to visit.    Past Medical History  Diagnosis Date  . CAD (coronary artery disease) 11/2003     RCA 60% stenosis, LAD diffuse  . CVA (cerebrovascular accident) 2001     right frontoparietal cortical CVA - MRI September 2001  . Asthma   . Diabetes mellitus   . GERD (gastroesophageal reflux disease)      EGD June 2004 -  followed by Dr. Leone PayorGessner  . Hyperlipidemia   . Hypertension   . OA (osteoarthritis)      DJP coracoclavicular ligament,  left patella osteophytes June 2002, L4-S1 spondylosis, degenerative disc disease with bulge. I in October 2001, the C2-C4 spondylosis without stenosis and with spurs based on x-ray July 2005; diffuse idiopathic skeletal hyperostosis with bilateral hip lumbar degenerative changes   on x-ray February 2005, bilateral plantar calcaneal spurs  . Diastolic dysfunction      ejection fraction 55-65% on 2-D echo May 2006  . Empty sella syndrome      based on MRI September 2001, related to obesity, hypertension, it was determined that patient has not required treatment but will be monitored with TSH, ACTH, cortisol, testosterone, prolactin, growth hormone  . NAFLD (nonalcoholic fatty liver disease)      based on ultrasound June 2002, elevated transaminase  . Reactive airway disease      peak flow 180 in May 2005, chronic sinusitis with PND,  bronchitis December 2002 and strep pneumonia April 2007,  FEC 66, MCV 60, ratio is 69, DLCO 57,  moderate restrictive and mild obstructive disease  on spirometry May 2005  . Granulomatous disease, chronic      in right upper lobe per x-ray December 2005  . Chicken pox      Review of Systems  Musculoskeletal: Positive for back pain and arthralgias (Left knee pain).  Neurological: Negative for dizziness, seizures, weakness, light-headedness and numbness.      Objective:    BP 160/92 mmHg  Pulse 107  Temp(Src) 97.7 F (36.5 C) (Oral)  Resp 18  Ht  (1.651 m)  Wt 211 lb 6.4 oz (95.89 kg)  BMI 35.18 kg/m2  SpO2 97% Nursing note and vital signs reviewed.  Physical Exam  Constitutional: She is oriented to person, place, and time. She appears well-developed and well-nourished. No distress.  Cardiovascular: Normal rate, regular rhythm, normal heart sounds and intact distal pulses.   Pulmonary/Chest: Effort normal and breath sounds normal.  Musculoskeletal:  Low back-no obvious deformity, discoloration, or edema of lumbar spine noted. Palpable tenderness paraspinal musculature on the left. Patient exhibits limited range of motion in flexion. Straight leg raise is negative. Reflexes and pulses are intact and appropriate.  Left knee-no obvious deformity or discoloration noted. Mild edema present. Palpable tenderness center of patella where patient landed. Patient exhibits full range of motion with 4 over 5 strength. Ligaments and meniscus are intact and appropriate.  Neurological: She is alert and oriented to person, place, and time.  Skin: Skin is warm and dry.  Psychiatric: She has a normal mood and affect. Her behavior is normal. Judgment and thought content normal.       Assessment & Plan:

## 2014-06-26 NOTE — Progress Notes (Signed)
Pre visit review using our clinic review tool, if applicable. No additional management support is needed unless otherwise documented below in the visit note. 

## 2014-06-26 NOTE — Assessment & Plan Note (Signed)
Symptoms and exam consistent with patellar contusion. Recommend alternating ice and heat with multiple episodes of range of motion throughout the day. Start meloxicam as needed for inflammation. Follow-up if symptoms worsen or fail to improve.

## 2014-06-27 ENCOUNTER — Other Ambulatory Visit: Payer: Self-pay | Admitting: Family

## 2014-06-27 NOTE — Telephone Encounter (Signed)
Pt aware of results 

## 2014-07-03 ENCOUNTER — Ambulatory Visit: Payer: Medicare Other | Admitting: Family

## 2014-07-05 DIAGNOSIS — M545 Low back pain: Secondary | ICD-10-CM | POA: Diagnosis not present

## 2014-07-11 ENCOUNTER — Telehealth: Payer: Self-pay | Admitting: Family

## 2014-07-11 NOTE — Telephone Encounter (Signed)
Pt request refill for ibuprofen (ADVIL,MOTRIN) 800 MG to be send [facility] with some refill. Please advise,

## 2014-07-12 ENCOUNTER — Other Ambulatory Visit: Payer: Self-pay

## 2014-07-12 MED ORDER — IBUPROFEN 800 MG PO TABS: 800.0000 mg | ORAL_TABLET | Freq: Four times a day (QID) | ORAL | Status: DC | PRN

## 2014-07-12 NOTE — Telephone Encounter (Signed)
Rx sent 

## 2014-07-23 ENCOUNTER — Other Ambulatory Visit: Payer: Self-pay | Admitting: Family

## 2014-07-23 DIAGNOSIS — M5136 Other intervertebral disc degeneration, lumbar region: Secondary | ICD-10-CM | POA: Diagnosis not present

## 2014-07-24 DIAGNOSIS — M5136 Other intervertebral disc degeneration, lumbar region: Secondary | ICD-10-CM | POA: Diagnosis not present

## 2014-07-25 DIAGNOSIS — M5136 Other intervertebral disc degeneration, lumbar region: Secondary | ICD-10-CM | POA: Diagnosis not present

## 2014-07-25 DIAGNOSIS — M545 Low back pain: Secondary | ICD-10-CM | POA: Diagnosis not present

## 2014-07-26 DIAGNOSIS — M5136 Other intervertebral disc degeneration, lumbar region: Secondary | ICD-10-CM | POA: Diagnosis not present

## 2014-07-27 DIAGNOSIS — M5136 Other intervertebral disc degeneration, lumbar region: Secondary | ICD-10-CM | POA: Diagnosis not present

## 2014-07-28 DIAGNOSIS — M5136 Other intervertebral disc degeneration, lumbar region: Secondary | ICD-10-CM | POA: Diagnosis not present

## 2014-07-29 DIAGNOSIS — M5136 Other intervertebral disc degeneration, lumbar region: Secondary | ICD-10-CM | POA: Diagnosis not present

## 2014-07-30 ENCOUNTER — Other Ambulatory Visit: Payer: Self-pay

## 2014-07-30 DIAGNOSIS — M5136 Other intervertebral disc degeneration, lumbar region: Secondary | ICD-10-CM | POA: Diagnosis not present

## 2014-07-30 MED ORDER — MELOXICAM 7.5 MG PO TABS: 7.5000 mg | ORAL_TABLET | Freq: Every day | ORAL | Status: DC

## 2014-07-30 MED ORDER — LINAGLIPTIN 5 MG PO TABS: 5.0000 mg | ORAL_TABLET | Freq: Every day | ORAL | Status: DC

## 2014-07-31 DIAGNOSIS — M545 Low back pain: Secondary | ICD-10-CM | POA: Diagnosis not present

## 2014-07-31 DIAGNOSIS — M25562 Pain in left knee: Secondary | ICD-10-CM | POA: Diagnosis not present

## 2014-07-31 DIAGNOSIS — M5136 Other intervertebral disc degeneration, lumbar region: Secondary | ICD-10-CM | POA: Diagnosis not present

## 2014-07-31 DIAGNOSIS — G603 Idiopathic progressive neuropathy: Secondary | ICD-10-CM | POA: Diagnosis not present

## 2014-07-31 DIAGNOSIS — G629 Polyneuropathy, unspecified: Secondary | ICD-10-CM | POA: Diagnosis not present

## 2014-07-31 DIAGNOSIS — G8929 Other chronic pain: Secondary | ICD-10-CM | POA: Diagnosis not present

## 2014-08-01 DIAGNOSIS — M5136 Other intervertebral disc degeneration, lumbar region: Secondary | ICD-10-CM | POA: Diagnosis not present

## 2014-08-02 ENCOUNTER — Ambulatory Visit: Payer: Medicare Other | Admitting: Family

## 2014-08-02 DIAGNOSIS — M5136 Other intervertebral disc degeneration, lumbar region: Secondary | ICD-10-CM | POA: Diagnosis not present

## 2014-08-03 DIAGNOSIS — M5136 Other intervertebral disc degeneration, lumbar region: Secondary | ICD-10-CM | POA: Diagnosis not present

## 2014-08-04 DIAGNOSIS — M5136 Other intervertebral disc degeneration, lumbar region: Secondary | ICD-10-CM | POA: Diagnosis not present

## 2014-08-05 ENCOUNTER — Other Ambulatory Visit: Payer: Self-pay | Admitting: Family

## 2014-08-05 DIAGNOSIS — M5136 Other intervertebral disc degeneration, lumbar region: Secondary | ICD-10-CM | POA: Diagnosis not present

## 2014-08-06 ENCOUNTER — Telehealth: Payer: Self-pay | Admitting: Family

## 2014-08-06 ENCOUNTER — Ambulatory Visit (INDEPENDENT_AMBULATORY_CARE_PROVIDER_SITE_OTHER): Payer: Medicare Other | Admitting: Family

## 2014-08-06 ENCOUNTER — Ambulatory Visit (INDEPENDENT_AMBULATORY_CARE_PROVIDER_SITE_OTHER)
Admission: RE | Admit: 2014-08-06 | Discharge: 2014-08-06 | Disposition: A | Discharge status: Home / Self Care | Payer: Medicare Other | Source: Ambulatory Visit | Attending: Family | Admitting: Family

## 2014-08-06 ENCOUNTER — Encounter: Payer: Self-pay | Admitting: Family

## 2014-08-06 VITALS — BP 140/92 | HR 119 | Temp 97.8°F | Resp 18

## 2014-08-06 DIAGNOSIS — M545 Low back pain: Secondary | ICD-10-CM

## 2014-08-06 DIAGNOSIS — IMO0002 Reserved for concepts with insufficient information to code with codable children: Secondary | ICD-10-CM

## 2014-08-06 DIAGNOSIS — M25562 Pain in left knee: Secondary | ICD-10-CM

## 2014-08-06 DIAGNOSIS — S8002XD Contusion of left knee, subsequent encounter: Secondary | ICD-10-CM | POA: Diagnosis not present

## 2014-08-06 DIAGNOSIS — E1165 Type 2 diabetes mellitus with hyperglycemia: Secondary | ICD-10-CM

## 2014-08-06 DIAGNOSIS — M5136 Other intervertebral disc degeneration, lumbar region: Secondary | ICD-10-CM | POA: Diagnosis not present

## 2014-08-06 DIAGNOSIS — M1712 Unilateral primary osteoarthritis, left knee: Secondary | ICD-10-CM | POA: Diagnosis not present

## 2014-08-06 MED ORDER — METFORMIN HCL 500 MG PO TABS: 500.0000 mg | ORAL_TABLET | Freq: Two times a day (BID) | ORAL | Status: DC

## 2014-08-06 MED ORDER — ALBUTEROL SULFATE HFA 108 (90 BASE) MCG/ACT IN AERS: 2.0000 | INHALATION_SPRAY | RESPIRATORY_TRACT | Status: DC | PRN

## 2014-08-06 MED ORDER — NAPROXEN 500 MG PO TABS: 500.0000 mg | ORAL_TABLET | Freq: Two times a day (BID) | ORAL | Status: DC

## 2014-08-06 NOTE — Assessment & Plan Note (Signed)
Patient expresses desire to stop her gentle and go back to metformin. Discontinue gentamicin and start metformin. Recheck A1c in 3 months.

## 2014-08-06 NOTE — Telephone Encounter (Signed)
Please inform the patient that her x-ray results show no evidence of fracture but does show osteoarthritic changes. The radiologist recommended an MRI and the orders have been placed. I will also send a referral to orthopedics.

## 2014-08-06 NOTE — Progress Notes (Signed)
Pre visit review using our clinic review tool, if applicable. No additional management support is needed unless otherwise documented below in the visit note. 

## 2014-08-06 NOTE — Assessment & Plan Note (Signed)
Stable with no indications for surgery. Refer to physical therapy.

## 2014-08-06 NOTE — Telephone Encounter (Signed)
Pt aware of results 

## 2014-08-06 NOTE — Assessment & Plan Note (Addendum)
Previous diagnosis of knee contusion still remains likely, although cannot rule out meniscal injury. Obtain left knee x-rays to rule out fracture. Will refer to physical therapy for evaluation and treatment. Discontinue ibuprofen and start Naprosyn. Recommend bedside commode as patient is having pain with ambulation.

## 2014-08-06 NOTE — Patient Instructions (Signed)
Thank you for choosing [facility].  Summary/Instructions:  Discontinue Tradjenta. Restart Metformin.  Discontinue ibuprofen. Start Naproxen.   Continue Potassium 10 mEq twice daily.  Ice as needed for pain.   Your prescription(s) have been submitted to your pharmacy or been printed and provided for you. Please take as directed and contact our office if you believe you are having problem(s) with the medication(s) or have any questions.  Please stop by radiology on the basement level of the building for your x-rays. Your results will be released to MyChart (or called to you) after review, usually within 72 hours after test completion. If any treatments or changes are necessary, you will be notified at that same time.  Referrals have been made during this visit. You should expect to hear back from our schedulers in about 7-10 days in regards to establishing an appointment with the specialists we discussed.   If your symptoms worsen or fail to improve, please contact our office for further instruction, or in case of emergency go directly to the emergency room at the closest medical facility.

## 2014-08-06 NOTE — Progress Notes (Signed)
Subjective:    Patient ID: Lindsay Hardy, female    DOB: November 22, 1950, 64 y.o.   MRN: [id number]  Chief Complaint  Patient presents with  . Follow-up    would like xray of both knees, says that both knees have given out and both legs are weak    HPI:  Lindsay Hardy is a 64 y.o. female who presents today for follow up.  1) Left knee pain - This a previous problem that has worsened. Experiencing bilateral achy knee pain and weakness that has been going on since her just after Christmas. Indicates that she feels like both knees are going to "give way". Indicates that she fell again on Friday and slide down causing a small abrasion to her right knee. Patient indicates that she is having difficulty moving around her house and states that she would like to get a bedside commode if possible.    2) Back pain - She was recently seen by Neurosurgery who indicated that there was no surgical interventions that were needed at this time.   3) Type 2 diabetes - Patient would like to stop taking Tradjenta and would like to restart taking her metformin.   Lab Results  Component Value Date   HGBA1C 8.6* 05/09/2014     Allergies  Allergen Reactions  . Atorvastatin     REACTION: unable to tolerate. reason unknown  . Clopidogrel Bisulfate     REACTION: weak, tired, sleepy, tightness (6/05); soreness in hip (11/05)  . Fexofenadine     REACTION: intolerance unknown  . Furosemide     REACTION: did not tolerate and preferred HCTZ  . Hydrochlorothiazide W-Triamterene     REACTION: things crawled in her head and felt funny(10/04) ; h/a (3/07)  . Lisinopril     REACTION: cough, right sided pain  . Metoprolol Tartrate     REACTION: lightheadedness, h/a cough (7/04); heart gong to stop (7/06); sluggishnes and depressed feeling (2/07); weakness/heaviness 6/07)  . Mometasone Furoate     REACTION: neck tenderness    Current Outpatient Prescriptions on File Prior to Visit  Medication Sig Dispense  Refill  . albuterol (PROAIR HFA) 108 (90 BASE) MCG/ACT inhaler Inhale 2 puffs into the lungs every 4 (four) hours as needed for shortness of breath.     Marland Kitchen. amLODipine (NORVASC) 10 MG tablet Take 10 mg by mouth daily.      Marland Kitchen. glipiZIDE (GLUCOTROL) 5 MG tablet Take 10 mg by mouth 2 (two) times daily before a meal.     . HYDROcodone-acetaminophen (NORCO/VICODIN) 5-325 MG per tablet Take 1 tablet by mouth 3 (three) times daily as needed (for pain).     Marland Kitchen. ibuprofen (ADVIL,MOTRIN) 800 MG tablet Take 1 tablet (800 mg total) by mouth every 6 (six) hours as needed (for pain). 30 tablet 2  . linagliptin (TRADJENTA) 5 MG TABS tablet Take 1 tablet (5 mg total) by mouth daily. 30 tablet 1  . LINZESS 145 MCG CAPS capsule Take 145 mcg by mouth daily.    . meloxicam (MOBIC) 7.5 MG tablet Take 1-2 tablets (7.5-15 mg total) by mouth daily. 60 tablet 0  . methocarbamol (ROBAXIN) 500 MG tablet Take 1 tablet (500 mg total) by mouth every 8 (eight) hours as needed for muscle spasms. 40 tablet 0  . omeprazole (PRILOSEC) 20 MG capsule Take 20 mg by mouth 2 (two) times daily as needed (for reflux).     . potassium chloride (K-DUR) 10 MEQ tablet Take 10 mEq by mouth 2 (  two) times daily.     . potassium chloride SA (K-DUR,KLOR-CON) 20 MEQ tablet TAKE 2 TABLETS BY MOUTH TWICE DAILY FOR 3 DAYS, THEN TAKE 1 TABLET BY MOUTH DAILY 60 tablet 0  . venlafaxine XR (EFFEXOR-XR) 75 MG 24 hr capsule Take 75 mg by mouth daily.     No current facility-administered medications on file prior to visit.    Review of Systems  Eyes:       Denies changes in vision  Endocrine: Negative for polydipsia, polyphagia and polyuria.  Musculoskeletal:       Postive for knee left pain.   Neurological: Negative for numbness.      Objective:    BP 140/92 mmHg  Pulse 119  Temp(Src) 97.8 F (36.6 C) (Oral)  Resp 18  SpO2 96% Nursing note and vital signs reviewed.  Seated left knee pain 7/10 Walking left knee pain  10/10   Physical Exam    Constitutional: She is oriented to person, place, and time. She appears well-developed and well-nourished. No distress.  Cardiovascular: Normal rate, regular rhythm, normal heart sounds and intact distal pulses.   Pulmonary/Chest: Effort normal and breath sounds normal.  Musculoskeletal:  Left knee: No obvious deformity, discoloration, or edema of left knee noted. Palpable tenderness along medial and lateral joint lines. Ligamentous testing is limited secondary to patient pain. Patient displays adequate range of motion in both flexion and extension. Muscle strength is 3+ out of 5 with knee extension and 4+ out of 5 with knee flexion.  Neurological: She is alert and oriented to person, place, and time.  Skin: Skin is warm and dry.  Psychiatric: She has a normal mood and affect. Her behavior is normal. Judgment and thought content normal.       Assessment & Plan:

## 2014-08-22 ENCOUNTER — Telehealth: Payer: Self-pay | Admitting: Family

## 2014-08-22 DIAGNOSIS — E119 Type 2 diabetes mellitus without complications: Secondary | ICD-10-CM

## 2014-08-22 DIAGNOSIS — E1165 Type 2 diabetes mellitus with hyperglycemia: Secondary | ICD-10-CM

## 2014-08-22 DIAGNOSIS — IMO0002 Reserved for concepts with insufficient information to code with codable children: Secondary | ICD-10-CM

## 2014-08-22 MED ORDER — METFORMIN HCL 500 MG PO TABS: 500.0000 mg | ORAL_TABLET | Freq: Two times a day (BID) | ORAL | Status: DC

## 2014-08-22 NOTE — Telephone Encounter (Signed)
Orders placed.

## 2014-08-22 NOTE — Telephone Encounter (Signed)
Pt aware.

## 2014-08-22 NOTE — Telephone Encounter (Signed)
Would like to get her potassium and A1c rechecked.

## 2014-08-22 NOTE — Telephone Encounter (Signed)
Patient stated that Dr Carver Filaalone forgot to send  The Potasium 10 mg and the metformin 500 mg to the pharmacy. Please advise

## 2014-08-22 NOTE — Addendum Note (Signed)
Addended by: Jeanine LuzALONE, GREGORY D on: 08/22/2014 12:10 PM   Modules accepted: Orders

## 2014-08-23 ENCOUNTER — Ambulatory Visit: Payer: Medicare Other | Admitting: *Deleted

## 2014-08-24 ENCOUNTER — Other Ambulatory Visit: Payer: Self-pay | Admitting: Family

## 2014-08-28 ENCOUNTER — Other Ambulatory Visit (INDEPENDENT_AMBULATORY_CARE_PROVIDER_SITE_OTHER): Payer: Medicare Other

## 2014-08-28 ENCOUNTER — Ambulatory Visit: Payer: Medicare Other | Admitting: Family

## 2014-08-28 DIAGNOSIS — E1165 Type 2 diabetes mellitus with hyperglycemia: Secondary | ICD-10-CM | POA: Diagnosis not present

## 2014-08-28 DIAGNOSIS — IMO0002 Reserved for concepts with insufficient information to code with codable children: Secondary | ICD-10-CM

## 2014-08-28 LAB — HEMOGLOBIN A1C: HEMOGLOBIN A1C: 6.9 % — AB (ref 4.6–6.5)

## 2014-08-28 LAB — POTASSIUM: POTASSIUM: 3.5 meq/L (ref 3.5–5.1)

## 2014-08-29 ENCOUNTER — Telehealth: Payer: Self-pay | Admitting: Family

## 2014-08-29 NOTE — Telephone Encounter (Signed)
Please inform the patient that her potassium is 3.5 which is normal and her A1c is 6.9 which is under the goal of 7. Please have her to continue to take the medications as prescribed and she can stay off the potassium for now.

## 2014-08-29 NOTE — Telephone Encounter (Signed)
Pt aware of results 

## 2014-08-31 ENCOUNTER — Other Ambulatory Visit: Payer: Self-pay | Admitting: Family

## 2014-09-03 ENCOUNTER — Other Ambulatory Visit: Payer: Self-pay

## 2014-09-06 ENCOUNTER — Ambulatory Visit: Payer: Medicare Other | Attending: Family | Admitting: Physical Therapy

## 2014-09-06 DIAGNOSIS — M545 Low back pain: Secondary | ICD-10-CM

## 2014-09-06 DIAGNOSIS — M1712 Unilateral primary osteoarthritis, left knee: Secondary | ICD-10-CM

## 2014-09-06 DIAGNOSIS — M179 Osteoarthritis of knee, unspecified: Secondary | ICD-10-CM | POA: Diagnosis present

## 2014-09-06 DIAGNOSIS — R269 Unspecified abnormalities of gait and mobility: Secondary | ICD-10-CM | POA: Diagnosis not present

## 2014-09-06 DIAGNOSIS — R5381 Other malaise: Secondary | ICD-10-CM | POA: Diagnosis not present

## 2014-09-06 NOTE — Patient Instructions (Signed)
Instructed patient in how to do an assisted  (supine) straight leg raise using a sheet to help elevate the leg then have her focus on lowering the knee eccentrically ; also incturcted in active sitting for core stability and how to keep this engaged with transfers and gait.

## 2014-09-06 NOTE — Therapy (Addendum)
[facility] [address] [place], [place], [address] Phone: [phone]   Fax:  [phone]  Physical Therapy Evaluation  Patient Details  Name: Lindsay Hardy MRN: [id number] Date of Birth: April 20, 1951 Referring Provider:  Veryl Speak, FNP  Encounter Date: 09/06/2014      PT End of Session - 09/06/14 1537    Visit Number 1   Number of Visits 14   Date for PT Re-Evaluation 11/05/14   PT Start Time 1340   PT Stop Time 1425   PT Time Calculation (min) 45 min   Activity Tolerance Patient tolerated treatment well;Other (comment)  pt stated that after doing the SLR ex's her knee felt a lot better and she was able to walk much better as well.   Behavior During Therapy [facility] for tasks assessed/performed      Past Medical History  Diagnosis Date  . CAD (coronary artery disease) 11/2003     RCA 60% stenosis, LAD diffuse  . CVA (cerebrovascular accident) 2001     right frontoparietal cortical CVA - MRI September 2001  . Asthma   . Diabetes mellitus   . GERD (gastroesophageal reflux disease)      EGD June 2004 -  followed by Dr. Leone Payor  . Hyperlipidemia   . Hypertension   . OA (osteoarthritis)      DJP coracoclavicular ligament, left patella osteophytes June 2002, L4-S1 spondylosis, degenerative disc disease with bulge. I in October 2001, the C2-C4 spondylosis without stenosis and with spurs based on x-ray July 2005; diffuse idiopathic skeletal hyperostosis with bilateral hip lumbar degenerative changes   on x-ray February 2005, bilateral plantar calcaneal spurs  . Diastolic dysfunction      ejection fraction 55-65% on 2-D echo May 2006  . Empty sella syndrome      based on MRI September 2001, related to obesity, hypertension, it was determined that patient has not required treatment but will be monitored with TSH, ACTH, cortisol, testosterone, prolactin, growth hormone  . NAFLD (nonalcoholic fatty liver disease)     based on ultrasound June 2002, elevated transaminase  . Reactive airway disease      peak flow 180 in May 2005, chronic sinusitis with PND, bronchitis December 2002 and strep pneumonia April 2007,  FEC 66, MCV 60, ratio is 69, DLCO 57,  moderate restrictive and mild obstructive disease  on spirometry May 2005  . Granulomatous disease, chronic      in right upper lobe per x-ray December 2005  . Chicken pox     Past Surgical History  Procedure Laterality Date  . Cholecystectomy  2003  . Carpal tunnel release  1992     left arm 1992  . Tonsillectomy  ?1973  . Tonsillectomy and adenoidectomy      There were no vitals filed for this visit.  Visit Diagnosis:  Osteoarthritis of left knee, unspecified osteoarthritis type - Plan: PT plan of care cert/re-cert  Midline low back pain, with sciatica presence unspecified - Plan: PT plan of care cert/re-cert  Abnormality of gait - Plan: PT plan of care cert/re-cert  Debility - Plan: PT plan of care cert/re-cert      Subjective Assessment - 09/06/14 1349    Symptoms both knees hurt a lot; wakes me up at night ; has chronic back pain; since she fell  on 06/03/14 she has had a lot of knee pain   Pertinent History back pain   Limitations Walking   How long can you sit  comfortably? 10-20 min; if i sit longer than 30 i am in  pain and  i cant get up ; i usually stay in my bed and just get a little to walk   How long can you stand comfortably? 10 min; legs start getting weak   How long can you walk comfortably? in the house ; short bouts   Patient Stated Goals I want to be able to walk on my own, get off the pain pills and not be so fearful of falling;     Currently in Pain? Yes   Pain Score 8    Pain Location Back   Pain Orientation Left;Lower   Pain Descriptors / Indicators Throbbing;Aching;Stabbing;Pressure   Pain Type Chronic pain   Pain Radiating Towards used too; not sure now that the knees hurt so much    Pain Onset More than a month  ago   Pain Frequency Constant   Aggravating Factors  sit to stand after prolonged sit   Pain Relieving Factors lieing down; using heat ; meds;    Effect of Pain on Daily Activities limits all activites   Multiple Pain Sites Yes   Pain Score 9   Pain Location Knee   Pain Orientation Other (Comment)   Pain Descriptors / Indicators Aching;Throbbing   Pain Type Acute pain   Pain Onset More than a month ago   Pain Frequency Constant   Aggravating Factors  sit to stand    Pain Relieving Factors heat; pain meds   Effect of Pain on Daily Activities can walk or transfer             [facility] PT Assessment - 09/07/14 1555    Assessment   Medical Diagnosis difficulty walking; knee pain   Onset Date 08/06/14   Precautions   Precautions Fall   Restrictions   Weight Bearing Restrictions No   Balance Screen   Has the patient fallen in the past 6 months No   Has the patient had a decrease in activity level because of a fear of falling?  Yes   Is the patient reluctant to leave their home because of a fear of falling?  Yes   ROM / Strength   AROM / PROM / Strength AROM   AROM   AROM Assessment Site Knee   Right/Left Knee Left   Left Knee Extension 0   Left Knee Flexion 110   Palpation   Palpation light touch to left knee elicited marked pain   Ambulation/Gait   Ambulation/Gait Yes   Ambulation Distance (Feet) 50 Feet   Assistive device Straight cane   Gait Pattern Decreased step length - right;Decreased step length - left;Right circumduction;Left circumduction;Decreased weight shift to right;Decreased weight shift to left;Wide base of support;Poor foot clearance - left;Poor foot clearance - right   Gait velocity 1.5 ft/sec   Standardized Balance Assessment   Standardized Balance Assessment Timed Up and Go Test   Timed Up and Go Test   Normal TUG (seconds) 35      Patient reported severe pain with light palpation and patella mob of left knee; she was unable to do an Active SLR due to  weakness and pain; gross assessment of core strength is very poor as she does not control descent into chair from standing or sit unsupported well w/o significant back pain ; poor abdominal tone and atrophy evident in bilateral LE's     Therapeutic Exercise Supine SLR assisted with sheet to raise up and eccentric focus  on lowering the knee; 3 x 10 bilateral;  Active sitting for posture re- ed and core stabilization                        PT Education - 09/06/14 1537    Education provided Yes   Education Details LE ex's ; posture re ed   Starwood HotelsPerson(s) Educated Patient   Methods Demonstration   Comprehension Returned demonstration          PT Short Term Goals - 09/06/14 1550    PT SHORT TERM GOAL #1   Title Patient will report left knee pain <3/10 with transfers and gait activity   Baseline 7-9/10   Time 4   Period Weeks   Status New   PT SHORT TERM GOAL #2   Title Patient will report lower back pian <3/10 with transfers and gait activity    Baseline 10/10   Time 4   Period Weeks   Status New   PT SHORT TERM GOAL #3   Title Patient will improve TUG score to < 20 seconds using LRAD; and pt able to tolerate the BERG and /or DGI to further assess functional ability .   Baseline tug at 30    Time 4   Period Weeks   Status New           PT Long Term Goals - 09/06/14 1553    PT LONG TERM GOAL #1   Title Patient able to ambulate >1000' reporting <1/10 knee pain using LRAD to achieve community ambulation   Time 8   Period Weeks   Status New   PT LONG TERM GOAL #2   Title Patient will demonstrate core stabilization with correct posture with all ADL's reporting <2/10 low back pain   Time 8   Period Weeks   Status New   PT LONG TERM GOAL #3   Title Patient will demonstrate a low fall risk evident by TUG <10 seconds and BERG > 45   Time 8   Period Weeks   Status New               Plan - 09/06/14 1544    Clinical Impression Statement Pt has chronic  back pain and acute left knee pain ; (this will be evaled by orthpedic MD next Thursday) she is at a high fall risk due to balance deficits as well associated with core weakness and joint pain; she has poor step reflex reaction;  she responded well to the basic LE ex's we did today and should continue to improve with ther ex and balance challenges; she needs to improve her core stabilizaiton and joint stability in order to address the balance deficits    Pt will benefit from skilled therapeutic intervention in order to improve on the following deficits Difficulty walking;Decreased strength;Decreased mobility;Decreased balance;Decreased activity tolerance;Pain   Rehab Potential Good   PT Frequency 2x / week   PT Duration 8 weeks   PT Treatment/Interventions Therapeutic activities;Patient/family education;Therapeutic exercise;Balance training;Gait training;Neuromuscular re-education;Stair training   PT Next Visit Plan Assess tolerance of LE ex's ; add core stability ex's    PT Home Exercise Plan Progress a comprehensive program   Consulted and Agree with Plan of Care Patient         Problem List Patient Active Problem List   Diagnosis Date Noted  . Knee contusion 06/26/2014  . Low back pain 05/09/2014  . Type II diabetes mellitus, uncontrolled 06/17/2006  .  HYPERLIPIDEMIA 06/17/2006  . HYPERTENSION 06/17/2006  . CORONARY ARTERY DISEASE 06/17/2006  . DIASTOLIC DYSFUNCTION 06/17/2006  . DISEASE, CEREBROVASCULAR NEC 06/17/2006  . GERD 06/17/2006  . OSTEOARTHRITIS 06/17/2006    Vashti Hey D PT DPT 09/07/2014, 3:56 PM  [facility] [address] [place], [place], [address] Phone: [phone]   Fax:  [phone]

## 2014-09-10 ENCOUNTER — Ambulatory Visit: Payer: Medicare Other | Attending: Family

## 2014-09-10 DIAGNOSIS — R5381 Other malaise: Secondary | ICD-10-CM | POA: Insufficient documentation

## 2014-09-10 DIAGNOSIS — M25561 Pain in right knee: Secondary | ICD-10-CM

## 2014-09-10 DIAGNOSIS — M25562 Pain in left knee: Secondary | ICD-10-CM

## 2014-09-10 DIAGNOSIS — R269 Unspecified abnormalities of gait and mobility: Secondary | ICD-10-CM | POA: Insufficient documentation

## 2014-09-10 DIAGNOSIS — M179 Osteoarthritis of knee, unspecified: Secondary | ICD-10-CM | POA: Diagnosis present

## 2014-09-10 DIAGNOSIS — M545 Low back pain: Secondary | ICD-10-CM | POA: Insufficient documentation

## 2014-09-10 DIAGNOSIS — M1712 Unilateral primary osteoarthritis, left knee: Secondary | ICD-10-CM

## 2014-09-10 NOTE — Therapy (Signed)
[facility] [address] [place], [place], [address] Phone: [phone]   Fax:  [phone]  Physical Therapy Treatment  Patient Details  Name: Lindsay Hardy MRN: [id number] Date of Birth: 06-21-1950 Referring Provider:  Veryl Speak, FNP  Encounter Date: 09/10/2014      PT End of Session - 09/10/14 1216    Visit Number 2   Number of Visits 14   Date for PT Re-Evaluation 11/05/14   Authorization Type Medicare, G code required every 10th visit   PT Start Time 1018   PT Stop Time 1100   PT Time Calculation (min) 42 min      Past Medical History  Diagnosis Date  . CAD (coronary artery disease) 11/2003     RCA 60% stenosis, LAD diffuse  . CVA (cerebrovascular accident) 2001     right frontoparietal cortical CVA - MRI September 2001  . Asthma   . Diabetes mellitus   . GERD (gastroesophageal reflux disease)      EGD June 2004 -  followed by Dr. Leone Payor  . Hyperlipidemia   . Hypertension   . OA (osteoarthritis)      DJP coracoclavicular ligament, left patella osteophytes June 2002, L4-S1 spondylosis, degenerative disc disease with bulge. I in October 2001, the C2-C4 spondylosis without stenosis and with spurs based on x-ray July 2005; diffuse idiopathic skeletal hyperostosis with bilateral hip lumbar degenerative changes   on x-ray February 2005, bilateral plantar calcaneal spurs  . Diastolic dysfunction      ejection fraction 55-65% on 2-D echo May 2006  . Empty sella syndrome      based on MRI September 2001, related to obesity, hypertension, it was determined that patient has not required treatment but will be monitored with TSH, ACTH, cortisol, testosterone, prolactin, growth hormone  . NAFLD (nonalcoholic fatty liver disease)      based on ultrasound June 2002, elevated transaminase  . Reactive airway disease      peak flow 180 in May 2005, chronic sinusitis with PND, bronchitis December 2002 and strep  pneumonia April 2007,  FEC 66, MCV 60, ratio is 69, DLCO 57,  moderate restrictive and mild obstructive disease  on spirometry May 2005  . Granulomatous disease, chronic      in right upper lobe per x-ray December 2005  . Chicken pox     Past Surgical History  Procedure Laterality Date  . Cholecystectomy  2003  . Carpal tunnel release  1992     left arm 1992  . Tonsillectomy  ?1973  . Tonsillectomy and adenoidectomy      There were no vitals filed for this visit.  Visit Diagnosis:  Midline low back pain, with sciatica presence unspecified  Osteoarthritis of left knee, unspecified osteoarthritis type  Bilateral knee pain      Subjective Assessment - 09/10/14 1023    Subjective Pt is having a lot of knee pain today which she reports is worse with the cooler mornings.    Currently in Pain? Yes   Pain Score 10-Worst pain ever   Pain Descriptors / Indicators Throbbing;Heaviness;Sharp   Pain Type Chronic pain   Pain Onset In the past 7 days   Pain Frequency Intermittent   Pain Relieving Factors nothing     3 minutes seated BLE pendulum for knees with 3# ankle weights   Assessed mobility of left patella, fair mobility up/down/left/right noted. Pt reported pain with knee extension both passive and active, worse with active knee extension.  Pain was replicated with quads stretch. Performed quads stretch 3x30 seconds passive each leg to tolerance. Knee pain was reduced following this.  Prone hip internal rotation each leg 10x  Prone glut kickbacks 10x each leg with MIN A  Taught how to perform quad stretch in sidelying using cane  Pt has increased L SIJ pain in posterior pelvic tilt and increased L SIJ stretch sensation in anterior pelvic tilt  Performed R knee to chest with left leg dangling from mat 4 repetitions x 30 seconds each. Afterwards, pt had decreased SIJ pain,  Performed 15x anterior/posterior pelvic tilts within pain free  range.                         PT Education - 09/10/14 1215    Education provided Yes   Education Details HEP: pelvic tilts and quads stretch   Person(s) Educated Patient   Methods Explanation;Demonstration;Handout   Comprehension Verbalized understanding;Returned demonstration          PT Short Term Goals - 09/06/14 1550    PT SHORT TERM GOAL #1   Title Patient will report left knee pain <3/10 with transfers and gait activity   Baseline 7-9/10   Time 4   Period Weeks   Status New   PT SHORT TERM GOAL #2   Title Patient will report lower back pian <3/10 with transfers and gait activity    Baseline 10/10   Time 4   Period Weeks   Status New   PT SHORT TERM GOAL #3   Title Patient will improve TUG score to < 20 seconds using LRAD; and pt able to tolerate the BERG and /or DGI to further assess functional ability .   Baseline tug at 30    Time 4   Period Weeks   Status New           PT Long Term Goals - 09/06/14 1553    PT LONG TERM GOAL #1   Title Patient able to ambulate >1000' reporting <1/10 knee pain using LRAD to achieve community ambulation   Time 8   Period Weeks   Status New   PT LONG TERM GOAL #2   Title Patient will demonstrate core stabilization with correct posture with all ADL's reporting <2/10 low back pain   Time 8   Period Weeks   Status New   PT LONG TERM GOAL #3   Title Patient will demonstrate a low fall risk evident by TUG <10 seconds and BERG > 45   Time 8   Period Weeks   Status New               Plan - 09/10/14 1217    Clinical Impression Statement Pt has tight quads and has low back pain symptoms consistent with posteriorly rotated L inominate and responded well to treatment today with decreased pain reported.   PT Next Visit Plan Core stability exercises, continue to address quads/hamstrings tightness and leg weakness and posteriorly rotated L inominate        Problem List Patient Active Problem List    Diagnosis Date Noted  . Knee contusion 06/26/2014  . Low back pain 05/09/2014  . Type II diabetes mellitus, uncontrolled 06/17/2006  . HYPERLIPIDEMIA 06/17/2006  . HYPERTENSION 06/17/2006  . CORONARY ARTERY DISEASE 06/17/2006  . DIASTOLIC DYSFUNCTION 06/17/2006  . DISEASE, CEREBROVASCULAR NEC 06/17/2006  . GERD 06/17/2006  . OSTEOARTHRITIS 06/17/2006   Lamar LaundryJennifer Bhavika Schnider, PT,DPT,NCS 09/10/2014 12:30 PM Phone (  [address] [employer] [phone]         [facility] [address] [place], [place], [address] Phone: [phone]   Fax:  [phone]

## 2014-09-10 NOTE — Patient Instructions (Signed)
PELVIC TILT: Anterior   Start in slumped position. Roll pelvis forward and bring shoulders back while sticking chest out to arch back (don't let shoulders rise). Then slowly roll pelvis back. Perform within your pain free range. And take deep breaths. Do this 10x daily.  Copyright  VHI. All rights reserved.    Quadriceps Stretch   Use the hook of your cane to pull left heel in toward buttocks until a comfortable, gentle stretch is felt in front of thigh. Hold 30 seconds. Repeat 3 times per set on each leg. Perform In morning and night. http://orth.exer.us/155   Copyright  VHI. All rights reserved.

## 2014-09-14 ENCOUNTER — Ambulatory Visit: Payer: Medicare Other

## 2014-09-14 DIAGNOSIS — M25562 Pain in left knee: Principal | ICD-10-CM

## 2014-09-14 DIAGNOSIS — M179 Osteoarthritis of knee, unspecified: Secondary | ICD-10-CM | POA: Diagnosis not present

## 2014-09-14 DIAGNOSIS — R5381 Other malaise: Secondary | ICD-10-CM

## 2014-09-14 DIAGNOSIS — M25561 Pain in right knee: Secondary | ICD-10-CM

## 2014-09-14 NOTE — Therapy (Signed)
[facility] [address] [place], [place], [address] Phone: [phone]   Fax:  [phone]  Physical Therapy Treatment  Patient Details  Name: Lindsay Hardy MRN: [id number] Date of Birth: August 06, 1950 Referring Provider:  Veryl Speak, FNP  Encounter Date: 09/14/2014    Past Medical History  Diagnosis Date  . CAD (coronary artery disease) 11/2003     RCA 60% stenosis, LAD diffuse  . CVA (cerebrovascular accident) 2001     right frontoparietal cortical CVA - MRI September 2001  . Asthma   . Diabetes mellitus   . GERD (gastroesophageal reflux disease)      EGD June 2004 -  followed by Dr. Leone Payor  . Hyperlipidemia   . Hypertension   . OA (osteoarthritis)      DJP coracoclavicular ligament, left patella osteophytes June 2002, L4-S1 spondylosis, degenerative disc disease with bulge. I in October 2001, the C2-C4 spondylosis without stenosis and with spurs based on x-ray July 2005; diffuse idiopathic skeletal hyperostosis with bilateral hip lumbar degenerative changes   on x-ray February 2005, bilateral plantar calcaneal spurs  . Diastolic dysfunction      ejection fraction 55-65% on 2-D echo May 2006  . Empty sella syndrome      based on MRI September 2001, related to obesity, hypertension, it was determined that patient has not required treatment but will be monitored with TSH, ACTH, cortisol, testosterone, prolactin, growth hormone  . NAFLD (nonalcoholic fatty liver disease)      based on ultrasound June 2002, elevated transaminase  . Reactive airway disease      peak flow 180 in May 2005, chronic sinusitis with PND, bronchitis December 2002 and strep pneumonia April 2007,  FEC 66, MCV 60, ratio is 69, DLCO 57,  moderate restrictive and mild obstructive disease  on spirometry May 2005  . Granulomatous disease, chronic      in right upper lobe per x-ray December 2005  . Chicken pox     Past Surgical History   Procedure Laterality Date  . Cholecystectomy  2003  . Carpal tunnel release  1992     left arm 1992  . Tonsillectomy  ?1973  . Tonsillectomy and adenoidectomy      There were no vitals filed for this visit.  Visit Diagnosis:  No diagnosis found.      Subjective Assessment - 09/14/14 1025    Subjective Pt saw orthopod and new x-rays were taken. She reports that the orthopod said that the pain is due to weakness of the quads and arthritis.      Pt educated on the process of building muscle and delayed onset muscle soreness. Pt instructed to be sure and perform quadriceps stretching, daily. Also instructed to let me know if she is sore for 4 days following therapy again as we would want to decrease the intensity of the session if this occurs.    3x30 seconds each leg passive sidelying quads stretch within pain free range  2x6 straight leg raises each leg with min A each leg  2x10 straight leg raises with hip external rotation each leg with MIN A  2x6 straight leg raises with hip internal rotation each leg MIN A   3x10 short arc quads each leg without assist                     PT Short Term Goals - 09/06/14 1550    PT SHORT TERM GOAL #1   Title Patient  will report left knee pain <3/10 with transfers and gait activity   Baseline 7-9/10   Time 4   Period Weeks   Status New   PT SHORT TERM GOAL #2   Title Patient will report lower back pian <3/10 with transfers and gait activity    Baseline 10/10   Time 4   Period Weeks   Status New   PT SHORT TERM GOAL #3   Title Patient will improve TUG score to < 20 seconds using LRAD; and pt able to tolerate the BERG and /or DGI to further assess functional ability .   Baseline tug at 30    Time 4   Period Weeks   Status New           PT Long Term Goals - 09/06/14 1553    PT LONG TERM GOAL #1   Title Patient able to ambulate >1000' reporting <1/10 knee pain using LRAD to achieve community ambulation   Time  8   Period Weeks   Status New   PT LONG TERM GOAL #2   Title Patient will demonstrate core stabilization with correct posture with all ADL's reporting <2/10 low back pain   Time 8   Period Weeks   Status New   PT LONG TERM GOAL #3   Title Patient will demonstrate a low fall risk evident by TUG <10 seconds and BERG > 45   Time 8   Period Weeks   Status New               Problem List Patient Active Problem List   Diagnosis Date Noted  . Knee contusion 06/26/2014  . Low back pain 05/09/2014  . Type II diabetes mellitus, uncontrolled 06/17/2006  . HYPERLIPIDEMIA 06/17/2006  . HYPERTENSION 06/17/2006  . CORONARY ARTERY DISEASE 06/17/2006  . DIASTOLIC DYSFUNCTION 06/17/2006  . DISEASE, CEREBROVASCULAR NEC 06/17/2006  . GERD 06/17/2006  . OSTEOARTHRITIS 06/17/2006   Lamar LaundryJennifer Adamae Ricklefs, PT,DPT,NCS 09/14/2014 12:00 PM Phone [phone] FAX [phone]         [facility] [address] [place], [place], [address] Phone: [phone]   Fax:  [phone]

## 2014-09-17 ENCOUNTER — Ambulatory Visit: Payer: Medicare Other

## 2014-09-17 DIAGNOSIS — M179 Osteoarthritis of knee, unspecified: Secondary | ICD-10-CM | POA: Diagnosis not present

## 2014-09-17 DIAGNOSIS — M545 Low back pain: Secondary | ICD-10-CM

## 2014-09-17 DIAGNOSIS — M25561 Pain in right knee: Secondary | ICD-10-CM

## 2014-09-17 DIAGNOSIS — M25562 Pain in left knee: Principal | ICD-10-CM

## 2014-09-17 DIAGNOSIS — R5381 Other malaise: Secondary | ICD-10-CM

## 2014-09-17 NOTE — Therapy (Signed)
[facility] [address] [place], [place], [address] Phone: [phone]   Fax:  [phone]  Physical Therapy Treatment  Patient Details  Name: Lindsay Hardy MRN: [id number] Date of Birth: 1951/05/09 Referring Provider:  Veryl Speak, FNP  Encounter Date: 09/17/2014      PT End of Session - 09/17/14 1528    Visit Number 4   Number of Visits 14   Date for PT Re-Evaluation 11/05/14   Authorization Type Medicare, G code required every 10th visit   PT Start Time 1450   PT Stop Time 1533   PT Time Calculation (min) 43 min      Past Medical History  Diagnosis Date  . CAD (coronary artery disease) 11/2003     RCA 60% stenosis, LAD diffuse  . CVA (cerebrovascular accident) 2001     right frontoparietal cortical CVA - MRI September 2001  . Asthma   . Diabetes mellitus   . GERD (gastroesophageal reflux disease)      EGD June 2004 -  followed by Dr. Leone Payor  . Hyperlipidemia   . Hypertension   . OA (osteoarthritis)      DJP coracoclavicular ligament, left patella osteophytes June 2002, L4-S1 spondylosis, degenerative disc disease with bulge. I in October 2001, the C2-C4 spondylosis without stenosis and with spurs based on x-ray July 2005; diffuse idiopathic skeletal hyperostosis with bilateral hip lumbar degenerative changes   on x-ray February 2005, bilateral plantar calcaneal spurs  . Diastolic dysfunction      ejection fraction 55-65% on 2-D echo May 2006  . Empty sella syndrome      based on MRI September 2001, related to obesity, hypertension, it was determined that patient has not required treatment but will be monitored with TSH, ACTH, cortisol, testosterone, prolactin, growth hormone  . NAFLD (nonalcoholic fatty liver disease)      based on ultrasound June 2002, elevated transaminase  . Reactive airway disease      peak flow 180 in May 2005, chronic sinusitis with PND, bronchitis December 2002 and strep  pneumonia April 2007,  FEC 66, MCV 60, ratio is 69, DLCO 57,  moderate restrictive and mild obstructive disease  on spirometry May 2005  . Granulomatous disease, chronic      in right upper lobe per x-ray December 2005  . Chicken pox     Past Surgical History  Procedure Laterality Date  . Cholecystectomy  2003  . Carpal tunnel release  1992     left arm 1992  . Tonsillectomy  ?1973  . Tonsillectomy and adenoidectomy      There were no vitals filed for this visit.  Visit Diagnosis:  Bilateral knee pain  Midline low back pain, with sciatica presence unspecified  Debility      Subjective Assessment - 09/17/14 1454    Subjective Pt reports no pain currently but this is likely due to taking a pain pill for her constipation pain; she has been doing the quads stretches and this temporarily relieves the knee pain at home   Currently in Pain? No/denies      3x30 seconds passive prone quads stretching 3x10 each leg hamstring curls with 2# ankle weight 2x10 bridging--limited by pt's constipation pains  Supine single hip isometric contractions Seated core strengthening with dowel resistance Nu step all extremities with verbal cue to emphasize use of the BLE more than the BUE for lower extremity strength (pt unable to move the foot plates consistently without use of the  BUE due to weakness). Completed 6 minutes at level 3.  Sit to stand 10x -training required for proper sequencing and weight shift/form; extra time due to pt fearful of falling forward                           PT Short Term Goals - 09/14/14 1201    PT SHORT TERM GOAL #1   Title Patient will report left knee pain <3/10 with transfers and gait activity Target: 10/05/14   Baseline 7-9/10   Time 4   Period Weeks   Status New   PT SHORT TERM GOAL #2   Title Patient will report lower back pian <3/10 with transfers and gait activity Target: 10/05/14   Baseline 10/10   Time 4   Period Weeks    Status New   PT SHORT TERM GOAL #3   Title Patient will improve TUG score to < 20 seconds using LRAD; and pt able to tolerate the BERG and /or DGI to further assess functional ability . Target: 10/05/14   Baseline tug at 30    Time 4   Period Weeks   Status New           PT Long Term Goals - 09/14/14 1205    PT LONG TERM GOAL #1   Title Patient able to ambulate >1000' reporting <1/10 knee pain using LRAD to achieve community ambulation Target 11/02/14   Time 8   Period Weeks   Status New   PT LONG TERM GOAL #2   Title Patient will demonstrate core stabilization with correct posture with all ADL's reporting <2/10 low back pain  Target 11/02/14   Time 8   Period Weeks   Status New   PT LONG TERM GOAL #3   Title Patient will demonstrate a low fall risk evident by TUG <10 seconds and BERG > 45  Target 11/02/14   Time 8   Period Weeks   Status New               Plan - 09/17/14 1529    Clinical Impression Statement Pt limited today due to constipation pain. Unable to assess leg or back pain due to pt taking a pain medication earlier. Pt will continue to benefit form skilled PT services.   PT Next Visit Plan Core stability exercises, continue to address quads/hamstrings tightness and leg weakness and posteriorly rotated L inominate        Problem List Patient Active Problem List   Diagnosis Date Noted  . Knee contusion 06/26/2014  . Low back pain 05/09/2014  . Type II diabetes mellitus, uncontrolled 06/17/2006  . HYPERLIPIDEMIA 06/17/2006  . HYPERTENSION 06/17/2006  . CORONARY ARTERY DISEASE 06/17/2006  . DIASTOLIC DYSFUNCTION 06/17/2006  . DISEASE, CEREBROVASCULAR NEC 06/17/2006  . GERD 06/17/2006  . OSTEOARTHRITIS 06/17/2006    Lamar LaundryAlderson, Vaishali Baise D 09/17/2014, 5:03 PM  [facility] [address] [place], [place], [address] Phone: [phone]   Fax:  [phone]

## 2014-09-17 NOTE — Patient Instructions (Signed)
Functional Quadriceps: Sit to Stand   Sit on edge of chair, feet positioned behind knees, lean forward as far as you can, push up using hands as needed. Work towards being able to stand without pushing up.. Stand upright, extending knees fully. Repeat _10 ___ times per set. Do 1 sets per session. Do __1__ sessions per day.  http://orth.exer.us/735   Copyright  VHI. All rights reserved.

## 2014-09-26 ENCOUNTER — Ambulatory Visit: Payer: Medicare Other | Admitting: Physical Therapy

## 2014-09-28 ENCOUNTER — Ambulatory Visit: Payer: Medicare Other | Admitting: Physical Therapy

## 2014-10-01 ENCOUNTER — Ambulatory Visit: Payer: Medicare Other | Admitting: Physical Therapy

## 2014-10-02 ENCOUNTER — Other Ambulatory Visit: Payer: Self-pay | Admitting: Family

## 2014-10-02 NOTE — Telephone Encounter (Signed)
Ok to refill 

## 2014-10-03 ENCOUNTER — Ambulatory Visit: Payer: Medicare Other

## 2014-10-03 DIAGNOSIS — R5381 Other malaise: Secondary | ICD-10-CM

## 2014-10-03 DIAGNOSIS — M179 Osteoarthritis of knee, unspecified: Secondary | ICD-10-CM | POA: Diagnosis not present

## 2014-10-03 DIAGNOSIS — M25562 Pain in left knee: Principal | ICD-10-CM

## 2014-10-03 DIAGNOSIS — M25561 Pain in right knee: Secondary | ICD-10-CM

## 2014-10-03 NOTE — Therapy (Signed)
[facility] [address] [place], [place], [address] Phone: [phone]   Fax:  [phone]  Physical Therapy Treatment  Patient Details  Name: Lindsay Hardy MRN: [id number] Date of Birth: Sep 08, 1950 Referring Provider:  Veryl Speakalone, Gregory D, FNP  Encounter Date: 10/03/2014      PT End of Session - 10/03/14 1620    Visit Number 5   Number of Visits 14   Date for PT Re-Evaluation 11/05/14   Authorization Type Medicare, G code required every 10th visit   PT Start Time 1538   PT Stop Time 1617   PT Time Calculation (min) 39 min      Past Medical History  Diagnosis Date  . CAD (coronary artery disease) 11/2003     RCA 60% stenosis, LAD diffuse  . CVA (cerebrovascular accident) 2001     right frontoparietal cortical CVA - MRI September 2001  . Asthma   . Diabetes mellitus   . GERD (gastroesophageal reflux disease)      EGD June 2004 -  followed by Dr. Leone PayorGessner  . Hyperlipidemia   . Hypertension   . OA (osteoarthritis)      DJP coracoclavicular ligament, left patella osteophytes June 2002, L4-S1 spondylosis, degenerative disc disease with bulge. I in October 2001, the C2-C4 spondylosis without stenosis and with spurs based on x-ray July 2005; diffuse idiopathic skeletal hyperostosis with bilateral hip lumbar degenerative changes   on x-ray February 2005, bilateral plantar calcaneal spurs  . Diastolic dysfunction      ejection fraction 55-65% on 2-D echo May 2006  . Empty sella syndrome      based on MRI September 2001, related to obesity, hypertension, it was determined that patient has not required treatment but will be monitored with TSH, ACTH, cortisol, testosterone, prolactin, growth hormone  . NAFLD (nonalcoholic fatty liver disease)      based on ultrasound June 2002, elevated transaminase  . Reactive airway disease      peak flow 180 in May 2005, chronic sinusitis with PND, bronchitis December 2002 and strep  pneumonia April 2007,  FEC 66, MCV 60, ratio is 69, DLCO 57,  moderate restrictive and mild obstructive disease  on spirometry May 2005  . Granulomatous disease, chronic      in right upper lobe per x-ray December 2005  . Chicken pox     Past Surgical History  Procedure Laterality Date  . Cholecystectomy  2003  . Carpal tunnel release  1992     left arm 1992  . Tonsillectomy  ?1973  . Tonsillectomy and adenoidectomy      There were no vitals filed for this visit.  Visit Diagnosis:  Bilateral knee pain  Debility      Subjective Assessment - 10/03/14 1544    Subjective Pt reports she has had to miss therapy due to not feeling well, lots of life stressors, trouble with her diabetes. Pt reports she has been too stressed to monitor diabetes. She has been taking her medicatoin for diabetes though.   Currently in Pain? Yes   Pain Score 9    Pain Location Knee   Pain Orientation Right;Left   Pain Descriptors / Indicators Aching   Pain Type Chronic pain   Pain Onset More than a month ago   Aggravating Factors  sit to stand after prolonged sit       3x30 seconds seated hamstring stretch each leg 3x30 seconds passive quads stretch in prone 2x10 prone hamstring curls through pt's  full available AROM  -Step up/step downs at 1st stair (6" rise) with  BUE support with pt pulling herself up onto the step due to weak quads. -downgraded to step up/step downs 4" step with single UE support, verbal cues not to pull up, and to keep flat palm (not grasping the rail) performed forward step up/backward step down leading with each leg; then performed lateral step up/down with each leg. -attempted squat side-stepping with BUE support but pt reporting her knees are buckling and requested to hold off on this task.                            PT Short Term Goals - 09/14/14 1201    PT SHORT TERM GOAL #1   Title Patient will report left knee pain <3/10 with transfers and gait  activity Target: 10/05/14   Baseline 7-9/10   Time 4   Period Weeks   Status New   PT SHORT TERM GOAL #2   Title Patient will report lower back pian <3/10 with transfers and gait activity Target: 10/05/14   Baseline 10/10   Time 4   Period Weeks   Status New   PT SHORT TERM GOAL #3   Title Patient will improve TUG score to < 20 seconds using LRAD; and pt able to tolerate the BERG and /or DGI to further assess functional ability . Target: 10/05/14   Baseline tug at 30    Time 4   Period Weeks   Status New           PT Long Term Goals - 09/14/14 1205    PT LONG TERM GOAL #1   Title Patient able to ambulate >1000' reporting <1/10 knee pain using LRAD to achieve community ambulation Target 11/02/14   Time 8   Period Weeks   Status New   PT LONG TERM GOAL #2   Title Patient will demonstrate core stabilization with correct posture with all ADL's reporting <2/10 low back pain  Target 11/02/14   Time 8   Period Weeks   Status New   PT LONG TERM GOAL #3   Title Patient will demonstrate a low fall risk evident by TUG <10 seconds and BERG > 45  Target 11/02/14   Time 8   Period Weeks   Status New               Plan - 10/03/14 1621    Clinical Impression Statement Pt immediately experiences reduction in lower extremity pain following stretching. Reports she is stretching for HEP regularly however pt's AROM (grossly) has not significantly improved. Pt's slow healing is also likey related to chronic natue of pain, pt's reported high amount of current life stretssors, and frequent cancellations/no shows with PT. Continue per POC to maximize pain reduction and functional use of BLE.   PT Next Visit Plan check goals, closed chain quad strength within pain free range   Consulted and Agree with Plan of Care Patient        Problem List Patient Active Problem List   Diagnosis Date Noted  . Knee contusion 06/26/2014  . Low back pain 05/09/2014  . Type II diabetes mellitus,  uncontrolled 06/17/2006  . HYPERLIPIDEMIA 06/17/2006  . HYPERTENSION 06/17/2006  . CORONARY ARTERY DISEASE 06/17/2006  . DIASTOLIC DYSFUNCTION 06/17/2006  . DISEASE, CEREBROVASCULAR NEC 06/17/2006  . GERD 06/17/2006  . OSTEOARTHRITIS 06/17/2006   Lamar Laundry, PT,DPT,NCS 10/03/2014 4:29 PM Phone [phone]  [employer] [phone]         [facility] [address] [place], [place], [address] Phone: [phone]   Fax:  [phone]

## 2014-10-08 ENCOUNTER — Ambulatory Visit: Payer: Medicare Other | Attending: Family

## 2014-10-08 ENCOUNTER — Telehealth: Payer: Self-pay | Admitting: Family

## 2014-10-08 VITALS — BP 130/99 | HR 107

## 2014-10-08 DIAGNOSIS — M179 Osteoarthritis of knee, unspecified: Secondary | ICD-10-CM | POA: Insufficient documentation

## 2014-10-08 DIAGNOSIS — R269 Unspecified abnormalities of gait and mobility: Secondary | ICD-10-CM | POA: Insufficient documentation

## 2014-10-08 DIAGNOSIS — R5381 Other malaise: Secondary | ICD-10-CM | POA: Insufficient documentation

## 2014-10-08 DIAGNOSIS — M545 Low back pain: Secondary | ICD-10-CM | POA: Insufficient documentation

## 2014-10-08 MED ORDER — GLIPIZIDE 5 MG PO TABS: 10.0000 mg | ORAL_TABLET | Freq: Two times a day (BID) | ORAL | Status: DC

## 2014-10-08 NOTE — Telephone Encounter (Signed)
Rx sent in. Pt aware 

## 2014-10-08 NOTE — Telephone Encounter (Signed)
Patient requesting a refill of glipiZIDE (GLUCOTROL) 5 MG tablet [[id number]][[id number]] sent to [facility] on [address]. She has an appt for tomorrow already set

## 2014-10-08 NOTE — Therapy (Signed)
[facility] [address] [place], [place], [address] Phone: [phone]   Fax:  [phone]  Physical Therapy Treatment  Patient Details  Name: Lindsay Hardy MRN: [id number] Date of Birth: 1950-11-25 Referring Provider:  Veryl Speak, FNP  Encounter Date: 10/08/2014      PT End of Session - 10/08/14 1505    Visit Number --  previous visit was 5; today was "arrived no charge"   Number of Visits 14   Date for PT Re-Evaluation 11/05/14   PT Start Time 1445   PT Stop Time 1500   PT Time Calculation (min) 15 min      Past Medical History  Diagnosis Date  . CAD (coronary artery disease) 11/2003     RCA 60% stenosis, LAD diffuse  . CVA (cerebrovascular accident) 2001     right frontoparietal cortical CVA - MRI September 2001  . Asthma   . Diabetes mellitus   . GERD (gastroesophageal reflux disease)      EGD June 2004 -  followed by Dr. Leone Payor  . Hyperlipidemia   . Hypertension   . OA (osteoarthritis)      DJP coracoclavicular ligament, left patella osteophytes June 2002, L4-S1 spondylosis, degenerative disc disease with bulge. I in October 2001, the C2-C4 spondylosis without stenosis and with spurs based on x-ray July 2005; diffuse idiopathic skeletal hyperostosis with bilateral hip lumbar degenerative changes   on x-ray February 2005, bilateral plantar calcaneal spurs  . Diastolic dysfunction      ejection fraction 55-65% on 2-D echo May 2006  . Empty sella syndrome      based on MRI September 2001, related to obesity, hypertension, it was determined that patient has not required treatment but will be monitored with TSH, ACTH, cortisol, testosterone, prolactin, growth hormone  . NAFLD (nonalcoholic fatty liver disease)      based on ultrasound June 2002, elevated transaminase  . Reactive airway disease      peak flow 180 in May 2005, chronic sinusitis with PND, bronchitis December 2002 and strep pneumonia  April 2007,  FEC 66, MCV 60, ratio is 69, DLCO 57,  moderate restrictive and mild obstructive disease  on spirometry May 2005  . Granulomatous disease, chronic      in right upper lobe per x-ray December 2005  . Chicken pox     Past Surgical History  Procedure Laterality Date  . Cholecystectomy  2003  . Carpal tunnel release  1992     left arm 1992  . Tonsillectomy  ?1973  . Tonsillectomy and adenoidectomy      Filed Vitals:   10/08/14 1449 10/08/14 1456  BP: 140/99 130/99  Pulse: 110 107    Visit Diagnosis:  Debility      Subjective Assessment - 10/08/14 1451    Subjective Pt reports feeling dizzy for the past 2 hours. Believes it is "because of my diabetes" but reports she did eat lunch and she took her blood pressure medication. She was not able to take her medication (glipizide) for diabetess (not since last week) due to the pharmacy not refilling it, but this has been straightened out and pt believes she will be able to pick it up today.  She took a metformin instead-reports she has previously stopped taking this but had some left over in her medicine cabinet. Pt reports she did good at church getting up/down yesterday and she reported that this made her feel better.  PT Short Term Goals - 09/14/14 1201    PT SHORT TERM GOAL #1   Title Patient will report left knee pain <3/10 with transfers and gait activity Target: 10/05/14   Baseline 7-9/10   Time 4   Period Weeks   Status New   PT SHORT TERM GOAL #2   Title Patient will report lower back pian <3/10 with transfers and gait activity Target: 10/05/14   Baseline 10/10   Time 4   Period Weeks   Status New   PT SHORT TERM GOAL #3   Title Patient will improve TUG score to < 20 seconds using LRAD; and pt able to tolerate the BERG and /or DGI to further assess functional ability . Target: 10/05/14   Baseline tug at 30    Time 4   Period Weeks   Status New            PT Long Term Goals - 09/14/14 1205    PT LONG TERM GOAL #1   Title Patient able to ambulate >1000' reporting <1/10 knee pain using LRAD to achieve community ambulation Target 11/02/14   Time 8   Period Weeks   Status New   PT LONG TERM GOAL #2   Title Patient will demonstrate core stabilization with correct posture with all ADL's reporting <2/10 low back pain  Target 11/02/14   Time 8   Period Weeks   Status New   PT LONG TERM GOAL #3   Title Patient will demonstrate a low fall risk evident by TUG <10 seconds and BERG > 45  Target 11/02/14   Time 8   Period Weeks   Status New               Plan - 10/08/14 1506    Clinical Impression Statement Pt unable to participate in therapy due to elevated BP. Epic inbasket message sent to her PCP. Pt does report feeling stronger since starting PT   PT Next Visit Plan check goals, closed chain quad strength within pain free range        Problem List Patient Active Problem List   Diagnosis Date Noted  . Knee contusion 06/26/2014  . Low back pain 05/09/2014  . Type II diabetes mellitus, uncontrolled 06/17/2006  . HYPERLIPIDEMIA 06/17/2006  . HYPERTENSION 06/17/2006  . CORONARY ARTERY DISEASE 06/17/2006  . DIASTOLIC DYSFUNCTION 06/17/2006  . DISEASE, CEREBROVASCULAR NEC 06/17/2006  . GERD 06/17/2006  . OSTEOARTHRITIS 06/17/2006    Lamar LaundryJennifer Abbott Jasinski, PT,DPT,NCS 10/08/2014 3:07 PM Phone [phone] FAX [phone]          [facility] [address] [place], [place], [address] Phone: [phone]   Fax:  [phone]

## 2014-10-09 ENCOUNTER — Ambulatory Visit: Payer: Medicare Other | Admitting: Family

## 2014-10-10 ENCOUNTER — Ambulatory Visit: Payer: Medicare Other

## 2014-10-10 NOTE — Therapy (Signed)
[facility] [address] [place], [place], [address] Phone: [phone]   Fax:  [phone]  Physical Therapy Evaluation  Patient Details  Name: Lindsay PonderSylvia Santucci MRN: [id number] Date of Birth: 05/03/1951 Referring Provider:  Veryl Speakalone, Gregory D, FNP  Encounter Date: 09/06/2014    Past Medical History  Diagnosis Date  . CAD (coronary artery disease) 11/2003     RCA 60% stenosis, LAD diffuse  . CVA (cerebrovascular accident) 2001     right frontoparietal cortical CVA - MRI September 2001  . Asthma   . Diabetes mellitus   . GERD (gastroesophageal reflux disease)      EGD June 2004 -  followed by Dr. Leone PayorGessner  . Hyperlipidemia   . Hypertension   . OA (osteoarthritis)      DJP coracoclavicular ligament, left patella osteophytes June 2002, L4-S1 spondylosis, degenerative disc disease with bulge. I in October 2001, the C2-C4 spondylosis without stenosis and with spurs based on x-ray July 2005; diffuse idiopathic skeletal hyperostosis with bilateral hip lumbar degenerative changes   on x-ray February 2005, bilateral plantar calcaneal spurs  . Diastolic dysfunction      ejection fraction 55-65% on 2-D echo May 2006  . Empty sella syndrome      based on MRI September 2001, related to obesity, hypertension, it was determined that patient has not required treatment but will be monitored with TSH, ACTH, cortisol, testosterone, prolactin, growth hormone  . NAFLD (nonalcoholic fatty liver disease)      based on ultrasound June 2002, elevated transaminase  . Reactive airway disease      peak flow 180 in May 2005, chronic sinusitis with PND, bronchitis December 2002 and strep pneumonia April 2007,  FEC 66, MCV 60, ratio is 69, DLCO 57,  moderate restrictive and mild obstructive disease  on spirometry May 2005  . Granulomatous disease, chronic      in right upper lobe per x-ray December 2005  . Chicken pox     Past Surgical History   Procedure Laterality Date  . Cholecystectomy  2003  . Carpal tunnel release  1992     left arm 1992  . Tonsillectomy  ?1973  . Tonsillectomy and adenoidectomy      There were no vitals filed for this visit.  Visit Diagnosis:  Osteoarthritis of left knee, unspecified osteoarthritis type - Plan: PT plan of care cert/re-cert  Midline low back pain, with sciatica presence unspecified - Plan: PT plan of care cert/re-cert  Abnormality of gait - Plan: PT plan of care cert/re-cert  Debility - Plan: PT plan of care cert/re-cert                               PT Short Term Goals - 09/14/14 1201    PT SHORT TERM GOAL #1   Title Patient will report left knee pain <3/10 with transfers and gait activity Target: 10/05/14   Baseline 7-9/10   Time 4   Period Weeks   Status New   PT SHORT TERM GOAL #2   Title Patient will report lower back pian <3/10 with transfers and gait activity Target: 10/05/14   Baseline 10/10   Time 4   Period Weeks   Status New   PT SHORT TERM GOAL #3   Title Patient will improve TUG score to < 20 seconds using LRAD; and pt able to tolerate the BERG and /or DGI to further assess functional ability .  Target: 10/05/14   Baseline tug at 30    Time 4   Period Weeks   Status New           PT Long Term Goals - 09/14/14 1205    PT LONG TERM GOAL #1   Title Patient able to ambulate >1000' reporting <1/10 knee pain using LRAD to achieve community ambulation Target 11/02/14   Time 8   Period Weeks   Status New   PT LONG TERM GOAL #2   Title Patient will demonstrate core stabilization with correct posture with all ADL's reporting <2/10 low back pain  Target 11/02/14   Time 8   Period Weeks   Status New   PT LONG TERM GOAL #3   Title Patient will demonstrate a low fall risk evident by TUG <10 seconds and BERG > 45  Target 11/02/14   Time 8   Period Weeks   Status New                Problem List Patient Active Problem List    Diagnosis Date Noted  . Knee contusion 06/26/2014  . Low back pain 05/09/2014  . Type II diabetes mellitus, uncontrolled 06/17/2006  . HYPERLIPIDEMIA 06/17/2006  . HYPERTENSION 06/17/2006  . CORONARY ARTERY DISEASE 06/17/2006  . DIASTOLIC DYSFUNCTION 06/17/2006  . DISEASE, CEREBROVASCULAR NEC 06/17/2006  . GERD 06/17/2006  . OSTEOARTHRITIS 06/17/2006    Cecilie KicksShoup, Jeffrey D 10/10/2014, Marylou Mccoy2:08 PM  [facility] [address] [place], [place], [address] Phone: [phone]   Fax:  [phone]

## 2014-10-11 ENCOUNTER — Ambulatory Visit: Payer: Medicare Other | Admitting: Family

## 2014-10-11 ENCOUNTER — Other Ambulatory Visit: Payer: Self-pay | Admitting: Family

## 2014-10-15 ENCOUNTER — Ambulatory Visit (INDEPENDENT_AMBULATORY_CARE_PROVIDER_SITE_OTHER): Payer: Medicare Other | Admitting: Family

## 2014-10-15 ENCOUNTER — Other Ambulatory Visit (INDEPENDENT_AMBULATORY_CARE_PROVIDER_SITE_OTHER): Payer: Medicare Other

## 2014-10-15 ENCOUNTER — Encounter: Payer: Self-pay | Admitting: Physical Therapy

## 2014-10-15 ENCOUNTER — Encounter: Payer: Self-pay | Admitting: Family

## 2014-10-15 ENCOUNTER — Ambulatory Visit: Payer: Medicare Other | Admitting: Physical Therapy

## 2014-10-15 VITALS — BP 142/98 | HR 107 | Temp 98.4°F | Resp 16 | Ht 65.0 in | Wt 228.0 lb

## 2014-10-15 VITALS — BP 135/98 | HR 106

## 2014-10-15 DIAGNOSIS — M1712 Unilateral primary osteoarthritis, left knee: Secondary | ICD-10-CM | POA: Diagnosis not present

## 2014-10-15 DIAGNOSIS — E1165 Type 2 diabetes mellitus with hyperglycemia: Secondary | ICD-10-CM

## 2014-10-15 DIAGNOSIS — M25562 Pain in left knee: Principal | ICD-10-CM

## 2014-10-15 DIAGNOSIS — M25561 Pain in right knee: Secondary | ICD-10-CM

## 2014-10-15 DIAGNOSIS — M179 Osteoarthritis of knee, unspecified: Secondary | ICD-10-CM | POA: Diagnosis present

## 2014-10-15 DIAGNOSIS — R269 Unspecified abnormalities of gait and mobility: Secondary | ICD-10-CM

## 2014-10-15 DIAGNOSIS — I10 Essential (primary) hypertension: Secondary | ICD-10-CM

## 2014-10-15 DIAGNOSIS — M545 Low back pain: Secondary | ICD-10-CM | POA: Diagnosis not present

## 2014-10-15 DIAGNOSIS — R5381 Other malaise: Secondary | ICD-10-CM | POA: Diagnosis not present

## 2014-10-15 DIAGNOSIS — IMO0002 Reserved for concepts with insufficient information to code with codable children: Secondary | ICD-10-CM

## 2014-10-15 LAB — COMPREHENSIVE METABOLIC PANEL
ALK PHOS: 70 U/L (ref 39–117)
ALT: 17 U/L (ref 0–35)
AST: 15 U/L (ref 0–37)
Albumin: 3.7 g/dL (ref 3.5–5.2)
BILIRUBIN TOTAL: 0.4 mg/dL (ref 0.2–1.2)
BUN: 13 mg/dL (ref 6–23)
CO2: 33 meq/L — AB (ref 19–32)
CREATININE: 0.7 mg/dL (ref 0.40–1.20)
Calcium: 9.3 mg/dL (ref 8.4–10.5)
Chloride: 102 mEq/L (ref 96–112)
GFR: 107.88 mL/min (ref >60.00)
Glucose, Bld: 75 mg/dL (ref 70–99)
Potassium: 3.3 mEq/L — ABNORMAL LOW (ref 3.5–5.1)
Sodium: 140 mEq/L (ref 135–145)
TOTAL PROTEIN: 7.2 g/dL (ref 6.0–8.3)

## 2014-10-15 MED ORDER — GLIPIZIDE 10 MG PO TABS: 10.0000 mg | ORAL_TABLET | Freq: Two times a day (BID) | ORAL | Status: DC

## 2014-10-15 NOTE — Assessment & Plan Note (Addendum)
Blood pressure remains elevated above goal of 140/90. Continue current dosage of amlodipine. Patient currently using hydrochlorothiazide as needed for edema. Start hydrochlorothiazide daily. Start the DASH diet.  Follow-up in 2 months for recheck electrolytes.

## 2014-10-15 NOTE — Patient Instructions (Signed)
Thank you for choosing [facility].  Summary/Instructions:  Your prescription(s) have been submitted to your pharmacy or been printed and provided for you. Please take as directed and contact our office if you believe you are having problem(s) with the medication(s) or have any questions.  Please stop by the lab on the basement level of the building for your blood work. Your results will be released to MyChart (or called to you) after review, usually within 72 hours after test completion. If any changes need to be made, you will be notified at that same time.  If your symptoms worsen or fail to improve, please contact our office for further instruction, or in case of emergency go directly to the emergency room at the closest medical facility.   DASH Eating Plan DASH stands for "Dietary Approaches to Stop Hypertension." The DASH eating plan is a healthy eating plan that has been shown to reduce high blood pressure (hypertension). Additional health benefits may include reducing the risk of type 2 diabetes mellitus, heart disease, and stroke. The DASH eating plan may also help with weight loss. WHAT DO I NEED TO KNOW ABOUT THE DASH EATING PLAN? For the DASH eating plan, you will follow these general guidelines:  Choose foods with a percent daily value for sodium of less than 5% (as listed on the food label).  Use salt-free seasonings or herbs instead of table salt or sea salt.  Check with your health care provider or pharmacist before using salt substitutes.  Eat lower-sodium products, often labeled as "lower sodium" or "no salt added."  Eat fresh foods.  Eat more vegetables, fruits, and low-fat dairy products.  Choose whole grains. Look for the word "whole" as the first word in the ingredient list.  Choose fish and skinless chicken or [place] more often than red meat. Limit fish, poultry, and meat to 6 oz (170 g) each day.  Limit sweets, desserts, sugars, and sugary  drinks.  Choose heart-healthy fats.  Limit cheese to 1 oz (28 g) per day.  Eat more home-cooked food and less restaurant, buffet, and fast food.  Limit fried foods.  Cook foods using methods other than frying.  Limit canned vegetables. If you do use them, rinse them well to decrease the sodium.  When eating at a restaurant, ask that your food be prepared with less salt, or no salt if possible. WHAT FOODS CAN I EAT? Seek help from a dietitian for individual calorie needs. Grains Whole grain or whole wheat bread. Brown rice. Whole grain or whole wheat pasta. Quinoa, bulgur, and whole grain cereals. Low-sodium cereals. Corn or whole wheat flour tortillas. Whole grain cornbread. Whole grain crackers. Low-sodium crackers. Vegetables Fresh or frozen vegetables (raw, steamed, roasted, or grilled). Low-sodium or reduced-sodium tomato and vegetable juices. Low-sodium or reduced-sodium tomato sauce and paste. Low-sodium or reduced-sodium canned vegetables.  Fruits All fresh, canned (in natural juice), or frozen fruits. Meat and Other Protein Products Ground beef (85% or leaner), grass-fed beef, or beef trimmed of fat. Skinless chicken or [place]. Ground chicken or [place]key. Pork trimmed of fat. All fish and seafood. Eggs. Dried beans, peas, or lentils. Unsalted nuts and seeds. Unsalted canned beans. Dairy Low-fat dairy products, such as skim or 1% milk, 2% or reduced-fat cheeses, low-fat ricotta or cottage cheese, or plain low-fat yogurt. Low-sodium or reduced-sodium cheeses. Fats and Oils Tub margarines without trans fats. Light or reduced-fat mayonnaise and salad dressings (reduced sodium). Avocado. Safflower, olive, or canola oils. Natural peanut or almond butter.  Other Unsalted popcorn and pretzels. The items listed above may not be a complete list of recommended foods or beverages. Contact your dietitian for more options. WHAT FOODS ARE NOT RECOMMENDED? Grains White bread. White pasta.  White rice. Refined cornbread. Bagels and croissants. Crackers that contain trans fat. Vegetables Creamed or fried vegetables. Vegetables in a cheese sauce. Regular canned vegetables. Regular canned tomato sauce and paste. Regular tomato and vegetable juices. Fruits Dried fruits. Canned fruit in light or heavy syrup. Fruit juice. Meat and Other Protein Products Fatty cuts of meat. Ribs, chicken wings, bacon, sausage, bologna, salami, chitterlings, fatback, hot dogs, bratwurst, and packaged luncheon meats. Salted nuts and seeds. Canned beans with salt. Dairy Whole or 2% milk, cream, half-and-half, and cream cheese. Whole-fat or sweetened yogurt. Full-fat cheeses or blue cheese. Nondairy creamers and whipped toppings. Processed cheese, cheese spreads, or cheese curds. Condiments Onion and garlic salt, seasoned salt, table salt, and sea salt. Canned and packaged gravies. Worcestershire sauce. Tartar sauce. Barbecue sauce. Teriyaki sauce. Soy sauce, including reduced sodium. Steak sauce. Fish sauce. Oyster sauce. Cocktail sauce. Horseradish. Ketchup and mustard. Meat flavorings and tenderizers. Bouillon cubes. Hot sauce. Tabasco sauce. Marinades. Taco seasonings. Relishes. Fats and Oils Butter, stick margarine, lard, shortening, ghee, and bacon fat. Coconut, palm kernel, or palm oils. Regular salad dressings. Other Pickles and olives. Salted popcorn and pretzels. The items listed above may not be a complete list of foods and beverages to avoid. Contact your dietitian for more information. WHERE CAN I FIND MORE INFORMATION? National Heart, Lung, and Blood Institute: CablePromo.itwww.nhlbi.nih.gov/health/health-topics/topics/dash/ Document Released: 05/14/2011 Document Revised: 10/09/2013 Document Reviewed: 03/29/2013 [facility] Patient Information 2015 [place]. This information is not intended to replace advice given to you by your health care provider. Make sure you discuss any questions you have with your  health care provider.

## 2014-10-15 NOTE — Progress Notes (Signed)
Subjective:    Patient ID: Lindsay Hardy, female    DOB: 11-12-1950, 64 y.o.   MRN: [id number]  Chief Complaint  Patient presents with  . Follow-up    Wants blood work to check kidney and liver, also needs BP checked, PT wouldn't see her bc her heartrate and BP was up too high, also wants the glipizide to be 10 mg instead of 5 mg she states 5 is too low    HPI:  Lindsay Hardy is a 64 y.o. female with a PMH of hypertension, diastolic dysfunction, GERD, osteoarthritis of left knee, type 2 diabetes, and hyperlipidemia who presents today for an office follow up.  1) Type 2 diabetes - Previous A1c reveals controlled diabetes. Currently maintained on glipizide and metformin. Takes her medications as prescribed and denies any adverse side effects or hypoglycemic events.  Lab Results  Component Value Date   HGBA1C 6.9* 08/28/2014   2) Hypertension -  Blood pressure previously noted to remained over target of 140/90. Currently maintained on amlodipine. Takes her medication as prescribed and denies any adverse side effects. She notes she does take hydrochlorothiazide as needed for edema.  BP Readings from Last 3 Encounters:  10/15/14 142/98  10/15/14 135/98  10/08/14 130/99    3.) Osteoarthritis of left knee - patient has been working with physical therapy for the osteoarthritis of her left knee. Requesting a rolling walker with 5 inch wheels due to unsteady gait, bilateral knee pain and back pain. It is also noted the patient is at high risk for falls..   Allergies  Allergen Reactions  . Atorvastatin     REACTION: unable to tolerate. reason unknown  . Clopidogrel Bisulfate     REACTION: weak, tired, sleepy, tightness (6/05); soreness in hip (11/05)  . Fexofenadine     REACTION: intolerance unknown  . Furosemide     REACTION: did not tolerate and preferred HCTZ  . Hydrochlorothiazide W-Triamterene     REACTION: things crawled in her head and felt funny(10/04) ; h/a (3/07)  .  Lisinopril     REACTION: cough, right sided pain  . Metoprolol Tartrate     REACTION: lightheadedness, h/a cough (7/04); heart gong to stop (7/06); sluggishnes and depressed feeling (2/07); weakness/heaviness 6/07)  . Mometasone Furoate     REACTION: neck tenderness    Current Outpatient Prescriptions on File Prior to Visit  Medication Sig Dispense Refill  . albuterol (PROAIR HFA) 108 (90 BASE) MCG/ACT inhaler Inhale 2 puffs into the lungs every 4 (four) hours as needed for shortness of breath. 1 Inhaler 2  . amLODipine (NORVASC) 10 MG tablet Take 10 mg by mouth daily.      Marland Kitchen. glipiZIDE (GLUCOTROL) 5 MG tablet Take 2 tablets (10 mg total) by mouth 2 (two) times daily before a meal. 120 tablet 2  . HYDROcodone-acetaminophen (NORCO/VICODIN) 5-325 MG per tablet Take 1 tablet by mouth 3 (three) times daily as needed (for pain).     Marland Kitchen. LINZESS 145 MCG CAPS capsule Take 145 mcg by mouth daily.    . meloxicam (MOBIC) 7.5 MG tablet TAKE 1 TO 2 TABLETS BY MOUTH DAILY 60 tablet 0  . metFORMIN (GLUCOPHAGE) 500 MG tablet Take 1 tablet (500 mg total) by mouth 2 (two) times daily with a meal. 60 tablet 2  . naproxen (NAPROSYN) 500 MG tablet Take 1 tablet (500 mg total) by mouth 2 (two) times daily with a meal. 60 tablet 1  . omeprazole (PRILOSEC) 20 MG capsule Take 20  mg by mouth 2 (two) times daily as needed (for reflux).     . potassium chloride (K-DUR) 10 MEQ tablet Take 10 mEq by mouth 2 (two) times daily.     . potassium chloride SA (K-DUR,KLOR-CON) 20 MEQ tablet TAKE 2 TABLETS BY MOUTH TWICE DAILY FOR 3 DAYS THEN TAKE 1 TABLET BY MOUTH DAILY 60 tablet 0  . venlafaxine XR (EFFEXOR-XR) 75 MG 24 hr capsule Take 75 mg by mouth daily.     No current facility-administered medications on file prior to visit.    Past Medical History  Diagnosis Date  . CAD (coronary artery disease) 11/2003     RCA 60% stenosis, LAD diffuse  . CVA (cerebrovascular accident) 2001     right frontoparietal cortical CVA - MRI  September 2001  . Asthma   . Diabetes mellitus   . GERD (gastroesophageal reflux disease)      EGD June 2004 -  followed by Dr. Leone PayorGessner  . Hyperlipidemia   . Hypertension   . OA (osteoarthritis)      DJP coracoclavicular ligament, left patella osteophytes June 2002, L4-S1 spondylosis, degenerative disc disease with bulge. I in October 2001, the C2-C4 spondylosis without stenosis and with spurs based on x-ray July 2005; diffuse idiopathic skeletal hyperostosis with bilateral hip lumbar degenerative changes   on x-ray February 2005, bilateral plantar calcaneal spurs  . Diastolic dysfunction      ejection fraction 55-65% on 2-D echo May 2006  . Empty sella syndrome      based on MRI September 2001, related to obesity, hypertension, it was determined that patient has not required treatment but will be monitored with TSH, ACTH, cortisol, testosterone, prolactin, growth hormone  . NAFLD (nonalcoholic fatty liver disease)      based on ultrasound June 2002, elevated transaminase  . Reactive airway disease      peak flow 180 in May 2005, chronic sinusitis with PND, bronchitis December 2002 and strep pneumonia April 2007,  FEC 66, MCV 60, ratio is 69, DLCO 57,  moderate restrictive and mild obstructive disease  on spirometry May 2005  . Granulomatous disease, chronic      in right upper lobe per x-ray December 2005  . Chicken pox     Review of Systems  Eyes:       Denies changes in vision  Respiratory: Negative for chest tightness.   Cardiovascular: Negative for chest pain, palpitations and leg swelling.  Endocrine: Negative for polydipsia, polyphagia and polyuria.  Neurological: Negative for numbness.      Objective:    BP 142/98 mmHg  Pulse 107  Temp(Src) 98.4 F (36.9 C) (Oral)  Resp 16  Ht 5\' 5"  (1.651 m)  Wt 228 lb (103.42 kg)  BMI 37.94 kg/m2  SpO2 99% Nursing note and vital signs reviewed.  Physical Exam  Constitutional: She is oriented to person, place, and time. She  appears well-developed and well-nourished. No distress.  Cardiovascular: Normal rate, regular rhythm, normal heart sounds and intact distal pulses.   Pulmonary/Chest: Effort normal and breath sounds normal.  Neurological: She is alert and oriented to person, place, and time.  Skin: Skin is warm and dry.  Psychiatric: She has a normal mood and affect. Her behavior is normal. Judgment and thought content normal.       Assessment & Plan:

## 2014-10-15 NOTE — Assessment & Plan Note (Signed)
Type 2 diabetes is now controlled with previous A1c at 6.9. Continue current regimen of metformin and glipizide. Continue current dosages of metformin and glipizide. Increase glipizide tablet from 5 mg to 10 mg to increase ease of daily regimen. Obtain complete metabolic panel to check kidney function. Follow-up in 2 months for A1c.

## 2014-10-15 NOTE — Therapy (Signed)
[facility] [address] [place], [place], [address] Phone: [phone]   Fax:  [phone]  Physical Therapy Treatment  Patient Details  Name: Lindsay PonderSylvia Tanksley MRN: [id number] Date of Birth: 08-17-1950 Referring Provider:  Veryl Speakalone, Gregory D, FNP  Encounter Date: 10/15/2014      PT End of Session - 10/15/14 1354    Visit Number 6  G6   Number of Visits 14   Date for PT Re-Evaluation 11/05/14   Authorization Type Medicare, G code required every 10th visit   PT Start Time 1322   PT Stop Time 1345   PT Time Calculation (min) 23 min      Past Medical History  Diagnosis Date  . CAD (coronary artery disease) 11/2003     RCA 60% stenosis, LAD diffuse  . CVA (cerebrovascular accident) 2001     right frontoparietal cortical CVA - MRI September 2001  . Asthma   . Diabetes mellitus   . GERD (gastroesophageal reflux disease)      EGD June 2004 -  followed by Dr. Leone PayorGessner  . Hyperlipidemia   . Hypertension   . OA (osteoarthritis)      DJP coracoclavicular ligament, left patella osteophytes June 2002, L4-S1 spondylosis, degenerative disc disease with bulge. I in October 2001, the C2-C4 spondylosis without stenosis and with spurs based on x-ray July 2005; diffuse idiopathic skeletal hyperostosis with bilateral hip lumbar degenerative changes   on x-ray February 2005, bilateral plantar calcaneal spurs  . Diastolic dysfunction      ejection fraction 55-65% on 2-D echo May 2006  . Empty sella syndrome      based on MRI September 2001, related to obesity, hypertension, it was determined that patient has not required treatment but will be monitored with TSH, ACTH, cortisol, testosterone, prolactin, growth hormone  . NAFLD (nonalcoholic fatty liver disease)      based on ultrasound June 2002, elevated transaminase  . Reactive airway disease      peak flow 180 in May 2005, chronic sinusitis with PND, bronchitis December 2002 and strep  pneumonia April 2007,  FEC 66, MCV 60, ratio is 69, DLCO 57,  moderate restrictive and mild obstructive disease  on spirometry May 2005  . Granulomatous disease, chronic      in right upper lobe per x-ray December 2005  . Chicken pox     Past Surgical History  Procedure Laterality Date  . Cholecystectomy  2003  . Carpal tunnel release  1992     left arm 1992  . Tonsillectomy  ?1973  . Tonsillectomy and adenoidectomy      Filed Vitals:   10/15/14 1323  BP: 135/98  Pulse: 106    Visit Diagnosis:  Bilateral knee pain  Abnormality of gait      Subjective Assessment - 10/15/14 1328    Subjective Pt. reports feeling weak and tired; feeling same as she was last week when BP was elevated; scheduled to see Dr. Carver Filaalone today - thinks pain medicine may be causing heart rate to be increased  resting HR 106 -107   Currently in Pain? Yes   Pain Location Knee  both knees and back   Pain Orientation Right;Left   Pain Descriptors / Indicators Aching   Pain Type Chronic pain   Pain Onset More than a month ago   Pain Frequency Constant   Aggravating Factors  prolonged ambulation   Pain Relieving Factors pain medicine alleviates it at times   Multiple Pain Sites  Yes     Pt. Also c/o being constipated today with additional c/o back pain and bil. Knee pain; BP initially 135/98 with HR 107 and Did decrease approx. 15" later with seated rest position. Pt. Requested to hold PT until after MD appt. This pm - did Discuss use and benefit of RW to assist with steadiness with ambulation - pt. Had 1 LOB with amb. 50' from clinic gym  To lobby; also informed pt that use of RW may assist in decreasing back and knee pain by allowing UE weight bearing and thereby, Decrease all weight bearing on LE's.  Pt. Agrees and requests this device.  (SCRIPT sent with pt. For MD to sign this pm at appt.)                          PT Education - 10/15/14 1352    Education provided Yes    Education Details Informed pt of benefit of RW to reduce fall risk - pt. in agreement; completed DME prescription form for pt to take to MD this pm to obtain signature on form so RW can be given to her at next visit   Person(s) Educated Patient   Methods Explanation   Comprehension Verbalized understanding          PT Short Term Goals - 09/14/14 1201    PT SHORT TERM GOAL #1   Title Patient will report left knee pain <3/10 with transfers and gait activity Target: 10/05/14   Baseline 7-9/10   Time 4   Period Weeks   Status New   PT SHORT TERM GOAL #2   Title Patient will report lower back pian <3/10 with transfers and gait activity Target: 10/05/14   Baseline 10/10   Time 4   Period Weeks   Status New   PT SHORT TERM GOAL #3   Title Patient will improve TUG score to < 20 seconds using LRAD; and pt able to tolerate the BERG and /or DGI to further assess functional ability . Target: 10/05/14   Baseline tug at 30    Time 4   Period Weeks   Status New           PT Long Term Goals - 09/14/14 1205    PT LONG TERM GOAL #1   Title Patient able to ambulate >1000' reporting <1/10 knee pain using LRAD to achieve community ambulation Target 11/02/14   Time 8   Period Weeks   Status New   PT LONG TERM GOAL #2   Title Patient will demonstrate core stabilization with correct posture with all ADL's reporting <2/10 low back pain  Target 11/02/14   Time 8   Period Weeks   Status New   PT LONG TERM GOAL #3   Title Patient will demonstrate a low fall risk evident by TUG <10 seconds and BERG > 45  Target 11/02/14   Time 8   Period Weeks   Status New               Plan - 10/15/14 1358    Clinical Impression Statement Pt. continues to have elevated BP and elevated resting HR (107 initially and BP 135/98); pt. concerned about readings and not feeling well today - requested to hold PT until she sees MD this pm;  did recommend use of RW due to 1 occurrence of LOB due to pt.'s c/o bil.  knee pain due to OA; pt. agreed that RW would  help with gait and she agreed to use of this device; script sent with pt. to MD for signature                                                                                                        Pt will benefit from skilled therapeutic intervention in order to improve on the following deficits Difficulty walking;Decreased strength;Decreased mobility;Decreased balance;Decreased activity tolerance;Pain   Rehab Potential Good   PT Frequency 2x / week   PT Duration 8 weeks   PT Treatment/Interventions Therapeutic activities;Patient/family education;Therapeutic exercise;Balance training;Gait training;Neuromuscular re-education;Stair training   PT Next Visit Plan check goals; give RW if script returned and instruct in use of this device with ambulation   Consulted and Agree with Plan of Care Patient        Problem List Patient Active Problem List   Diagnosis Date Noted  . Knee contusion 06/26/2014  . Low back pain 05/09/2014  . Type II diabetes mellitus, uncontrolled 06/17/2006  . HYPERLIPIDEMIA 06/17/2006  . HYPERTENSION 06/17/2006  . CORONARY ARTERY DISEASE 06/17/2006  . DIASTOLIC DYSFUNCTION 06/17/2006  . DISEASE, CEREBROVASCULAR NEC 06/17/2006  . GERD 06/17/2006  . OSTEOARTHRITIS 06/17/2006    Kary Kos, PT 10/15/2014, 2:05 PM  [facility] [address] [place], [place], [address] Phone: [phone]   Fax:  [phone]

## 2014-10-15 NOTE — Progress Notes (Signed)
Pre visit review using our clinic review tool, if applicable. No additional management support is needed unless otherwise documented below in the visit note. 

## 2014-10-15 NOTE — Assessment & Plan Note (Signed)
Patient currently making progress and working with physical therapy for her left knee osteoarthritis, however gait abnormality continues to be noted. Physical therapy is requesting a rolling walker with 5 inch wheels secondary to patient's high fall risk. Prescription written.

## 2014-10-18 ENCOUNTER — Telehealth: Payer: Self-pay | Admitting: Family

## 2014-10-18 ENCOUNTER — Ambulatory Visit: Payer: Medicare Other

## 2014-10-18 NOTE — Telephone Encounter (Signed)
Please inform patient that her kidney and liver function are both within the normal limits.

## 2014-10-18 NOTE — Telephone Encounter (Signed)
Pt aware of results 

## 2014-10-22 ENCOUNTER — Encounter: Payer: Self-pay | Admitting: Physical Therapy

## 2014-10-22 ENCOUNTER — Ambulatory Visit: Payer: Medicare Other | Admitting: Physical Therapy

## 2014-10-22 VITALS — BP 135/93 | HR 109

## 2014-10-22 DIAGNOSIS — M179 Osteoarthritis of knee, unspecified: Secondary | ICD-10-CM | POA: Diagnosis not present

## 2014-10-22 DIAGNOSIS — R5381 Other malaise: Secondary | ICD-10-CM

## 2014-10-22 DIAGNOSIS — M25561 Pain in right knee: Secondary | ICD-10-CM

## 2014-10-22 DIAGNOSIS — R269 Unspecified abnormalities of gait and mobility: Secondary | ICD-10-CM

## 2014-10-22 DIAGNOSIS — M25562 Pain in left knee: Principal | ICD-10-CM

## 2014-10-22 NOTE — Therapy (Signed)
[facility] [address] [place], [place], [address] Phone: [phone]   Fax:  [phone]  Physical Therapy Treatment  Patient Details  Name: Lindsay Hardy MRN: [id number] Date of Birth: 05-08-1951 Referring Provider:  Golden Circle, FNP     10/22/14 1535  PT Visits / Re-Eval  Visit Number 7 ([facility])  Number of Visits 14  Date for PT Re-Evaluation 11/05/14  Authorization  Authorization Type Medicare, G code required every 10th visit  PT Time Calculation  PT Start Time 1532  PT Stop Time 1615  PT Time Calculation (min) 43 min  PT - End of Session  Equipment Utilized During Treatment Gait belt  Activity Tolerance Patient limited by pain  Behavior During Therapy [facility] for tasks assessed/performed    Encounter Date: 10/22/2014    Past Medical History  Diagnosis Date  . CAD (coronary artery disease) 11/2003     RCA 60% stenosis, LAD diffuse  . CVA (cerebrovascular accident) 2001     right frontoparietal cortical CVA - MRI September 2001  . Asthma   . Diabetes mellitus   . GERD (gastroesophageal reflux disease)      EGD June 2004 -  followed by Dr. Carlean Purl  . Hyperlipidemia   . Hypertension   . OA (osteoarthritis)      DJP coracoclavicular ligament, left patella osteophytes June 2002, L4-S1 spondylosis, degenerative disc disease with bulge. I in October 2001, the C2-C4 spondylosis without stenosis and with spurs based on x-ray July 2005; diffuse idiopathic skeletal hyperostosis with bilateral hip lumbar degenerative changes   on x-ray February 2005, bilateral plantar calcaneal spurs  . Diastolic dysfunction      ejection fraction 55-65% on 2-D echo May 2006  . Empty sella syndrome      based on MRI September 2001, related to obesity, hypertension, it was determined that patient has not required treatment but will be monitored with TSH, ACTH, cortisol, testosterone, prolactin, growth hormone  . NAFLD  (nonalcoholic fatty liver disease)      based on ultrasound June 2002, elevated transaminase  . Reactive airway disease      peak flow 180 in May 2005, chronic sinusitis with PND, bronchitis December 2002 and strep pneumonia April 2007,  FEC 66, MCV 60, ratio is 69, DLCO 57,  moderate restrictive and mild obstructive disease  on spirometry May 2005  . Granulomatous disease, chronic      in right upper lobe per x-ray December 2005  . Chicken pox     Past Surgical History  Procedure Laterality Date  . Cholecystectomy  2003  . Carpal tunnel release  1992     left arm 1992  . Tonsillectomy  ?1973  . Tonsillectomy and adenoidectomy      Filed Vitals:   10/22/14 1534 10/22/14 1549  BP: 127/94 135/93  Pulse: 110 109    Visit Diagnosis:  Bilateral knee pain  Abnormality of gait  Debility      10/22/14 1546  Ambulation/Gait  Ambulation/Gait Yes  Ambulation/Gait Assistance 4: Min guard;5: Supervision  Ambulation/Gait Assistance Details cues on posture and walker position with gait.           Ambulation Distance (Feet) 230 Feet  Assistive device Rolling walker  Gait Pattern Step-through pattern;Decreased stride length;Decreased step length - right;Decreased step length - left;Decreased hip/knee flexion - right;Decreased hip/knee flexion - left  Ambulation Surface Level;Indoor  Ramp Other (comment) (min guard assist with walker)  Ramp Details (indicate cue type and reason)  cues on posture and step length  Curb 4: Min assist (with walker)  Curb Details (indicate cue type and reason) cues on posture and sequence  Timed Up and Go Test  TUG Normal TUG  Normal TUG (seconds) 26.47   Exercises: Long arc quads 5 sec hold x 10 reps each leg With yellow theraband hamstring curls 2 sets of 5 reps each leg Stool scoots forward/backward along 10 foot path, x 2 laps each. Unable to stand from stool with mod assist, move stool next to mat table and performed lateral transfer stool to mat  to allow for standing.         PT Short Term Goals - 10/22/14 1538    PT SHORT TERM GOAL #1   Title Patient will report left knee pain <3/10 with transfers and gait activity Target: 10/05/14   Baseline 10/22/14: pt still reports pain as 8-9/10   Time --   Period --   Status Not Met   PT SHORT TERM GOAL #2   Title Patient will report lower back pian <3/10 with transfers and gait activity Target: 10/05/14   Baseline 10/22/14: reporting pain as 8/10   Time 4   Period Weeks   Status Not Met   PT SHORT TERM GOAL #3   Title Patient will improve TUG score to < 20 seconds using LRAD; and pt able to tolerate the BERG and /or DGI to further assess functional ability . Target: 10/05/14   Baseline 10/22/14: time up and go score was 26.47 sec's  today with walker.   Time --   Period --   Status Not Met           PT Long Term Goals - 09/14/14 1205    PT LONG TERM GOAL #1   Title Patient able to ambulate >1000' reporting <1/10 knee pain using LRAD to achieve community ambulation Target 11/02/14   Time 8   Period Weeks   Status New   PT LONG TERM GOAL #2   Title Patient will demonstrate core stabilization with correct posture with all ADL's reporting <2/10 low back pain  Target 11/02/14   Time 8   Period Weeks   Status New   PT LONG TERM GOAL #3   Title Patient will demonstrate a low fall risk evident by TUG <10 seconds and BERG > 45  Target 11/02/14   Time 8   Period Weeks   Status New           Plan - 10/22/14 1535    PT clinical Impression Pt's BP within acceptable ranges today. Issued walker to pt today and focues on education on walker use with gait on level surfaces and ramps/curbs today. Advanced exercises for knee pain as able with minimal increase in pain reported (after a decrease was reported with walker use). Pt making progress toward goals.                                                          Pt will benefit from skilled therapeutic intervention in order to improve on  the following deficits Difficulty walking;Decreased strength;Decreased mobility;Decreased balance;Decreased activity tolerance;Pain   Rehab Potential Good   PT Frequency 2x / week   PT Duration 8 weeks   PT Treatment/Interventions Therapeutic activities;Patient/family education;Therapeutic exercise;Balance training;Gait training;Neuromuscular re-education;Stair  training   PT Next Visit Plan check goals; give RW if script returned and instruct in use of this device with ambulation   Consulted and Agree with Plan of Care Patient      Problem List Patient Active Problem List   Diagnosis Date Noted  . Knee contusion 06/26/2014  . Low back pain 05/09/2014  . Type II diabetes mellitus, uncontrolled 06/17/2006  . HYPERLIPIDEMIA 06/17/2006  . Essential hypertension 06/17/2006  . CORONARY ARTERY DISEASE 06/17/2006  . DIASTOLIC DYSFUNCTION 43/83/7793  . DISEASE, CEREBROVASCULAR NEC 06/17/2006  . GERD 06/17/2006  . Osteoarthritis of left knee 06/17/2006    Willow Ora 10/24/2014, 1:00 AM  Willow Ora, PTA, [facility] [address], [place], [place] [address] [phone] 10/24/2014, 1:00 AM

## 2014-10-24 ENCOUNTER — Ambulatory Visit: Payer: Medicare Other | Admitting: Physical Therapy

## 2014-10-26 ENCOUNTER — Encounter: Payer: Self-pay | Admitting: Physical Therapy

## 2014-10-26 ENCOUNTER — Ambulatory Visit: Payer: Medicare Other | Admitting: Physical Therapy

## 2014-10-26 VITALS — BP 135/96 | HR 111

## 2014-10-26 DIAGNOSIS — R5381 Other malaise: Secondary | ICD-10-CM

## 2014-10-26 DIAGNOSIS — M25561 Pain in right knee: Secondary | ICD-10-CM

## 2014-10-26 DIAGNOSIS — R269 Unspecified abnormalities of gait and mobility: Secondary | ICD-10-CM

## 2014-10-26 DIAGNOSIS — M25562 Pain in left knee: Principal | ICD-10-CM

## 2014-10-26 DIAGNOSIS — M179 Osteoarthritis of knee, unspecified: Secondary | ICD-10-CM | POA: Diagnosis not present

## 2014-10-26 NOTE — Therapy (Signed)
[facility] [address] [place], [place], [address] Phone: [phone]   Fax:  [phone]  Physical Therapy Treatment  Patient Details  Name: Lindsay Hardy MRN: [id number] Date of Birth: 1951-04-11 Referring Provider:  Golden Circle, FNP  Encounter Date: 10/26/2014      PT End of Session - 10/26/14 1023    Visit Number 8  G8   Number of Visits 14   Date for PT Re-Evaluation 11/05/14   Authorization Type Medicare, G code required every 10th visit   PT Start Time 1018   PT Stop Time 1100   PT Time Calculation (min) 42 min   Equipment Utilized During Treatment Gait belt   Activity Tolerance Patient limited by pain   Behavior During Therapy [facility] for tasks assessed/performed      Past Medical History  Diagnosis Date  . CAD (coronary artery disease) 11/2003     RCA 60% stenosis, LAD diffuse  . CVA (cerebrovascular accident) 2001     right frontoparietal cortical CVA - MRI September 2001  . Asthma   . Diabetes mellitus   . GERD (gastroesophageal reflux disease)      EGD June 2004 -  followed by Dr. Carlean Purl  . Hyperlipidemia   . Hypertension   . OA (osteoarthritis)      DJP coracoclavicular ligament, left patella osteophytes June 2002, L4-S1 spondylosis, degenerative disc disease with bulge. I in October 2001, the C2-C4 spondylosis without stenosis and with spurs based on x-ray July 2005; diffuse idiopathic skeletal hyperostosis with bilateral hip lumbar degenerative changes   on x-ray February 2005, bilateral plantar calcaneal spurs  . Diastolic dysfunction      ejection fraction 55-65% on 2-D echo May 2006  . Empty sella syndrome      based on MRI September 2001, related to obesity, hypertension, it was determined that patient has not required treatment but will be monitored with TSH, ACTH, cortisol, testosterone, prolactin, growth hormone  . NAFLD (nonalcoholic fatty liver disease)      based on ultrasound  June 2002, elevated transaminase  . Reactive airway disease      peak flow 180 in May 2005, chronic sinusitis with PND, bronchitis December 2002 and strep pneumonia April 2007,  FEC 66, MCV 60, ratio is 69, DLCO 57,  moderate restrictive and mild obstructive disease  on spirometry May 2005  . Granulomatous disease, chronic      in right upper lobe per x-ray December 2005  . Chicken pox     Past Surgical History  Procedure Laterality Date  . Cholecystectomy  2003  . Carpal tunnel release  1992     left arm 1992  . Tonsillectomy  ?1973  . Tonsillectomy and adenoidectomy      Filed Vitals:   10/26/14 1021  BP: 135/96  Pulse: 111    Visit Diagnosis:  Bilateral knee pain  Abnormality of gait  Debility      Subjective Assessment - 10/26/14 1021    Subjective No new complaints. Feels the walker is helping to decrease her pain.   Currently in Pain? Yes   Pain Score 8    Pain Location Knee   Pain Orientation Right;Left   Pain Descriptors / Indicators Aching;Sore   Pain Type Chronic pain   Pain Onset More than a month ago   Pain Frequency Constant   Aggravating Factors  increased activity   Pain Relieving Factors pain medicine, rest   Effect of Pain on Daily Activities  limits all activities.                         [facility] Adult PT Treatment/Exercise - 10/26/14 1025    Ambulation/Gait   Ambulation/Gait Yes   Ambulation/Gait Assistance 5: Supervision;6: Modified independent (Device/Increase time)   Ambulation/Gait Assistance Details occasional cues to look up with gait. Decreased knee pain to 6/10 reported after gait. Limited distance due to pt with reports of shortness of breath.   Ambulation Distance (Feet) 230 Feet   Assistive device Rolling walker   Gait Pattern Step-through pattern   Ambulation Surface Level;Indoor   Berg Balance Test   Sit to Stand Able to stand  independently using hands   Standing Unsupported Able to stand safely 2 minutes    Sitting with Back Unsupported but Feet Supported on Floor or Stool Able to sit safely and securely 2 minutes   Stand to Sit Sits safely with minimal use of hands   Transfers Able to transfer safely, minor use of hands   Standing Unsupported with Eyes Closed Able to stand 10 seconds safely   Standing Ubsupported with Feet Together Able to place feet together independently and stand 1 minute safely   From Standing, Reach Forward with Outstretched Arm Can reach forward >12 cm safely (5")  7 inches   From Standing Position, Pick up Object from Floor Unable to pick up shoe, but reaches 2-5 cm (1-2") from shoe and balances independently  limited by back and knee pain   From Standing Position, Turn to Look Behind Over each Shoulder Looks behind one side only/other side shows less weight shift  right > left   Turn 360 Degrees Able to turn 360 degrees safely but slowly  > 8 seconds each way due to knee pain   Standing Unsupported, Alternately Place Feet on Step/Stool Able to stand independently and safely and complete 8 steps in 20 seconds  17.69 sec's   Standing Unsupported, One Foot in Front Able to plae foot ahead of the other independently and hold 30 seconds   Standing on One Leg Able to lift leg independently and hold 5-10 seconds  7 sec's   Total Score 47     Self Care: Discussed pt getting a reacher to better allow her to pick up items on low surfaces and high surfaces due to limitations from bil knee and back pain. Also educated pt on a walking program for building her strength and activity tolerance. Discussed going to the mall where there are places to rest as needed.        PT Short Term Goals - 10/22/14 1538    PT SHORT TERM GOAL #1   Title Patient will report left knee pain <3/10 with transfers and gait activity Target: 10/05/14   Baseline 10/22/14: pt still reports pain as 8-9/10   Time --   Period --   Status Not Met   PT SHORT TERM GOAL #2   Title Patient will report lower  back pian <3/10 with transfers and gait activity Target: 10/05/14   Baseline 10/22/14: reporting pain as 8/10   Time 4   Period Weeks   Status Not Met   PT SHORT TERM GOAL #3   Title Patient will improve TUG score to < 20 seconds using LRAD; and pt able to tolerate the BERG and /or DGI to further assess functional ability . Target: 10/05/14   Baseline 10/22/14: time up and go score was 26.47 sec's  today with walker.   Time --   Period --   Status Not Met           PT Long Term Goals - 10/26/14 1310    PT LONG TERM GOAL #1   Title Patient able to ambulate >1000' reporting <1/10 knee pain using LRAD to achieve community ambulation Target 11/02/14   Status On-going   PT LONG TERM GOAL #2   Title Patient will demonstrate core stabilization with correct posture with all ADL's reporting <2/10 low back pain  Target 11/02/14   Status On-going   PT LONG TERM GOAL #3   Title Patient will demonstrate a low fall risk evident by TUG <10 seconds and BERG > 45  Target 11/02/14   Baseline 10/26/14: Merrilee Jansky 47/56 today   Status Partially Met           Plan - 10/26/14 1023    Clinical Impression Statement Pt's BP elevated today, however under the cut off per our policy so able to work with pt today. Improved Berg Balance Test score today. Limited gait and activity tolerance due to bil knee and back pain. Pt making steady progress with some LTG's met.                       Pt will benefit from skilled therapeutic intervention in order to improve on the following deficits Difficulty walking;Decreased strength;Decreased mobility;Decreased balance;Decreased activity tolerance;Pain   Rehab Potential Good   PT Frequency 2x / week   PT Duration 8 weeks   PT Treatment/Interventions Therapeutic activities;Patient/family education;Therapeutic exercise;Balance training;Gait training;Neuromuscular re-education;Stair training   PT Next Visit Plan continue toward LTG"S. walker use on compliant/outdoor surfaces  weather permitting. continue with bil knee strengthening and finalize HEP. assess remaining LTG'S next week.   Consulted and Agree with Plan of Care Patient      Problem List Patient Active Problem List   Diagnosis Date Noted  . Knee contusion 06/26/2014  . Low back pain 05/09/2014  . Type II diabetes mellitus, uncontrolled 06/17/2006  . HYPERLIPIDEMIA 06/17/2006  . Essential hypertension 06/17/2006  . CORONARY ARTERY DISEASE 06/17/2006  . DIASTOLIC DYSFUNCTION 97/53/0051  . DISEASE, CEREBROVASCULAR NEC 06/17/2006  . GERD 06/17/2006  . Osteoarthritis of left knee 06/17/2006    Willow Ora 10/26/2014, 1:24 PM  Willow Ora, PTA, [facility] [address], [place], [place] [address] [phone] 10/26/2014, 1:24 PM

## 2014-11-01 ENCOUNTER — Other Ambulatory Visit: Payer: Self-pay | Admitting: Family

## 2014-11-22 ENCOUNTER — Other Ambulatory Visit: Payer: Self-pay | Admitting: Family

## 2014-11-27 ENCOUNTER — Ambulatory Visit: Payer: Medicare Other | Admitting: Family

## 2014-11-28 ENCOUNTER — Telehealth: Payer: Self-pay | Admitting: Geriatric Medicine

## 2014-11-28 NOTE — Telephone Encounter (Signed)
Tried to reach patient to ask if she has had a mammogram recently. Unable to reach her at the number provided.

## 2014-12-14 ENCOUNTER — Other Ambulatory Visit: Payer: Self-pay | Admitting: Family

## 2014-12-17 ENCOUNTER — Encounter: Payer: Self-pay | Admitting: Family

## 2014-12-17 ENCOUNTER — Ambulatory Visit (INDEPENDENT_AMBULATORY_CARE_PROVIDER_SITE_OTHER): Payer: Medicare Other | Admitting: Family

## 2014-12-17 VITALS — BP 130/88 | HR 101 | Temp 98.3°F | Ht 65.0 in | Wt 213.0 lb

## 2014-12-17 DIAGNOSIS — I10 Essential (primary) hypertension: Secondary | ICD-10-CM

## 2014-12-17 DIAGNOSIS — M1712 Unilateral primary osteoarthritis, left knee: Secondary | ICD-10-CM

## 2014-12-17 DIAGNOSIS — Z0289 Encounter for other administrative examinations: Secondary | ICD-10-CM | POA: Diagnosis not present

## 2014-12-17 MED ORDER — POTASSIUM CHLORIDE ER 10 MEQ PO TBCR: 10.0000 meq | EXTENDED_RELEASE_TABLET | Freq: Two times a day (BID) | ORAL | Status: DC

## 2014-12-17 MED ORDER — GLIPIZIDE 10 MG PO TABS: 10.0000 mg | ORAL_TABLET | Freq: Every day | ORAL | Status: DC

## 2014-12-17 NOTE — Patient Instructions (Signed)
Thank you for choosing [facility].  Summary/Instructions:  Your prescription(s) have been submitted to your pharmacy or been printed and provided for you. Please take as directed and contact our office if you believe you are having problem(s) with the medication(s) or have any questions.  Please schedule a time with one of the physicians in this office for paperwork completion.   Continue to take your medications as prescribed.

## 2014-12-17 NOTE — Assessment & Plan Note (Signed)
Decreased mobility secondary to osteoarthritis. Recommend DME bedside commode to assist with completing activities of daily living as needed at night and she cannot ascend/descend the stairs to get to the bathroom.

## 2014-12-17 NOTE — Progress Notes (Signed)
Subjective:    Patient ID: Lindsay PonderSylvia Hardy, female    DOB: 30-Apr-1951, 64 y.o.   MRN: [id number]  Chief Complaint  Patient presents with  . Discuss disability paperwork    request DME Commode, Glipize and Potassium refilled    HPI:  Lindsay Hardy is a 64 y.o. female with a PMH of hypertension, diastolic dysfunction, GERD, osteoarthritis, type 2 diabetes, hyperlipidemia, and low back pain who presents today for an office follow-up.   1.) Disability - Was previously enrolled in online school and she began to experience low back pain and was not able to sit and focus or drive which she believes is due to the medication. She was forced to discontinue her education secondary to her decreased concentration and pain which she was experiencing. Now she is requesting disability status to forgive the student loans for the degree which she was unable to complete. Indicates that she continues to take the medication and now can sit up a little better. Still has decreased mobility and has decreased focus although slightly improved. Reports that her attention has been decreased and that she does not have the attention that she used to. She also expresses some depressive symptoms as her quality of life has decreased and she does not read her bible as much as she used to. She has been on SSI disability for a total of about 9 years now secondary.  2.) Decreased mobility - experiencing the associated symptom of decreased mobility secondary to osteoarthritis and difficulty walking. Indicates she lives in a two-story housing development with family and she has difficulty ambulating and maneuvering stairs to get to the bathroom. She requests a bedside commode to have available for when she is unable to get up the steps.  Allergies  Allergen Reactions  . Atorvastatin     REACTION: unable to tolerate. reason unknown  . Clopidogrel Bisulfate     REACTION: weak, tired, sleepy, tightness (6/05); soreness in hip (11/05)   . Fexofenadine     REACTION: intolerance unknown  . Furosemide     REACTION: did not tolerate and preferred HCTZ  . Hydrochlorothiazide W-Triamterene     REACTION: things crawled in her head and felt funny(10/04) ; h/a (3/07)  . Lisinopril     REACTION: cough, right sided pain  . Metoprolol Tartrate     REACTION: lightheadedness, h/a cough (7/04); heart gong to stop (7/06); sluggishnes and depressed feeling (2/07); weakness/heaviness 6/07)  . Mometasone Furoate     REACTION: neck tenderness    Current Outpatient Prescriptions on File Prior to Visit  Medication Sig Dispense Refill  . albuterol (PROAIR HFA) 108 (90 BASE) MCG/ACT inhaler Inhale 2 puffs into the lungs every 4 (four) hours as needed for shortness of breath. 1 Inhaler 2  . amLODipine (NORVASC) 10 MG tablet Take 10 mg by mouth daily.      Marland Kitchen. HYDROcodone-acetaminophen (NORCO/VICODIN) 5-325 MG per tablet Take 1 tablet by mouth 3 (three) times daily as needed (for pain).     Marland Kitchen. LINZESS 145 MCG CAPS capsule TAKE 1 CAPSULE BY MOUTH EVERY MORNING 30 capsule 0  . meloxicam (MOBIC) 7.5 MG tablet TAKE 1 TO 2 TABLETS BY MOUTH DAILY 60 tablet 5  . naproxen (NAPROSYN) 500 MG tablet Take 1 tablet (500 mg total) by mouth 2 (two) times daily with a meal. 60 tablet 1  . omeprazole (PRILOSEC) 20 MG capsule Take 20 mg by mouth 2 (two) times daily as needed (for reflux).     .Marland Kitchen  venlafaxine XR (EFFEXOR-XR) 75 MG 24 hr capsule Take 75 mg by mouth daily.    . metFORMIN (GLUCOPHAGE) 500 MG tablet Take 1 tablet (500 mg total) by mouth 2 (two) times daily with a meal. (Patient not taking: Reported on 12/17/2014) 60 tablet 2  . potassium chloride SA (K-DUR,KLOR-CON) 20 MEQ tablet TAKE 2 TABLETS BY MOUTH TWICE DAILY FOR 3 DAYS THEN TAKE 1 TABLET BY MOUTH DAILY (Patient not taking: Reported on 12/17/2014) 60 tablet 0   No current facility-administered medications on file prior to visit.     Review of Systems  Constitutional: Negative for fever and chills.   Cardiovascular: Negative for chest pain, palpitations and leg swelling.  Musculoskeletal: Positive for arthralgias.  Psychiatric/Behavioral: Positive for dysphoric mood and decreased concentration. Negative for suicidal ideas. The patient is not nervous/anxious.       Objective:    BP 130/88 mmHg  Pulse 101  Temp(Src) 98.3 F (36.8 C) (Oral)  Ht  (1.651 m)  Wt 213 lb (96.616 kg)  BMI 35.44 kg/m2  SpO2 97% Nursing note and vital signs reviewed.  Physical Exam  Constitutional: She is oriented to person, place, and time. She appears well-developed and well-nourished. No distress.  Cardiovascular: Normal rate, regular rhythm, normal heart sounds and intact distal pulses.   Pulmonary/Chest: Effort normal and breath sounds normal.  Neurological: She is alert and oriented to person, place, and time.  Skin: Skin is warm and dry.  Psychiatric: Her behavior is normal. Judgment and thought content normal. She exhibits a depressed mood.       Assessment & Plan:   Problem List Items Addressed This Visit      Cardiovascular and Mediastinum   Essential hypertension - Primary     Musculoskeletal and Integument   Osteoarthritis of left knee    Decreased mobility secondary to osteoarthritis. Recommend DME bedside commode to assist with completing activities of daily living as needed at night and she cannot ascend/descend the stairs to get to the bathroom.        Other   Encounter for completion of form with patient    Received disability/student loan forgiveness forms from patient. Reviewed forms with patient and informed her that this provider is unable to complete the forms secondary to requirements of needing a physician with a medical degree or Dr. of osteopathic degree to complete the forms. Recommend scheduling with another provider in this office to review her request.

## 2014-12-17 NOTE — Progress Notes (Signed)
Pre visit review using our clinic review tool, if applicable. No additional management support is needed unless otherwise documented below in the visit note. 

## 2014-12-17 NOTE — Assessment & Plan Note (Signed)
Received disability/student loan forgiveness forms from patient. Reviewed forms with patient and informed her that this provider is unable to complete the forms secondary to requirements of needing a physician with a medical degree or Dr. of osteopathic degree to complete the forms. Recommend scheduling with another provider in this office to review her request.

## 2014-12-19 ENCOUNTER — Other Ambulatory Visit: Payer: Self-pay | Admitting: Family

## 2014-12-20 NOTE — Addendum Note (Signed)
Addended by: Vashti HeySHOUP, Austan Nicholl D on: 12/20/2014 03:36 PM   Modules accepted: Orders

## 2014-12-21 ENCOUNTER — Ambulatory Visit (INDEPENDENT_AMBULATORY_CARE_PROVIDER_SITE_OTHER): Payer: Medicare Other | Admitting: Internal Medicine

## 2014-12-21 ENCOUNTER — Other Ambulatory Visit (INDEPENDENT_AMBULATORY_CARE_PROVIDER_SITE_OTHER): Payer: Medicare Other

## 2014-12-21 ENCOUNTER — Encounter: Payer: Self-pay | Admitting: Internal Medicine

## 2014-12-21 VITALS — BP 120/90 | HR 95 | Temp 98.0°F | Resp 15 | Ht 65.0 in | Wt 213.5 lb

## 2014-12-21 DIAGNOSIS — E876 Hypokalemia: Secondary | ICD-10-CM

## 2014-12-21 DIAGNOSIS — I10 Essential (primary) hypertension: Secondary | ICD-10-CM | POA: Diagnosis not present

## 2014-12-21 DIAGNOSIS — E1140 Type 2 diabetes mellitus with diabetic neuropathy, unspecified: Secondary | ICD-10-CM

## 2014-12-21 DIAGNOSIS — M545 Low back pain: Secondary | ICD-10-CM | POA: Diagnosis not present

## 2014-12-21 LAB — POTASSIUM: Potassium: 3.6 mEq/L (ref 3.5–5.1)

## 2014-12-21 LAB — HEMOGLOBIN A1C: HEMOGLOBIN A1C: 6.3 % (ref 4.6–6.5)

## 2014-12-21 NOTE — Patient Instructions (Signed)
  Your next office appointment will be determined based upon review of your pending labs. Those written interpretation of the lab results and instructions will be transmitted to you by mail for your records.  Critical results will be called.   Followup as needed for any active or acute issue. Please report any significant change in your symptoms. 

## 2014-12-21 NOTE — Progress Notes (Signed)
   Subjective:    Patient ID: Argentina PonderSylvia Pisarski, female    DOB: 22-Nov-1950, 64 y.o.   MRN: [id number]  HPI   She is here with a form related to disability to discharge school loans. She feels she is disabled because she has "heart disease with a 70% blockage in the heart"; hypertension, diabetes & chronic low back pain. Mr Carver FilaCalone 's excellent summary 12/17/14 was reviewed.   Review of Systems There was no history of stent or bypass. Echocardiogram in 2006 revealed normal left ventricular function with ejection fraction of 55-65 %. No significant valvular disease was found. EKG 12/2013 revealed low voltage in V leads with low T waves V5&6. No ischemic changes present.  Her A1c has dropped from 8.6% in December 2015 down to 6.9 as of 08/28/14. She has had difficulty maintaining a normal potassium. Most recent value in May was 3.3. It had been as low as 2.8 in December 2015.  She feels her major issue is chronic back pain and diabetic nerve disease with constant pain in the lower extremities and need to use a cane. She is followed in a pain clinic. She seen multiple doctors concerning her back.      Objective:   Physical Exam  Pertinent or positive findings include: Her gait is broad and flat footed. She walks with a cane. S4 gallop cadence is noted. She exhibits the classic "low back crawl" lying down & sitting up on the exam table. She has a massive panniculus.  General appearance :adequately nourished; in no distress. BMI:35.53  Eyes: No conjunctival inflammation or scleral icterus is present.  Oral exam:  Lips and gums are healthy appearing.There is no oropharyngeal erythema or exudate noted.  Heart:  Slight tachycardia but regular rhythm. S1 and S2 normal without murmur, click, rub or other extra sounds    Lungs:Chest clear to auscultation; no wheezes, rhonchi,rales ,or rubs present.No increased work of breathing.   Abdomen: bowel sounds normal, soft and non-tender without masses,  organomegaly or hernias noted.  No guarding or rebound.   Vascular : all pulses equal ; no bruits present.  Skin:Warm & dry.  Intact without suspicious lesions or rashes ; no tenting   Lymphatic: No lymphadenopathy is noted about the head, neck, axilla.   Neuro: Strength, tone decreased       Assessment & Plan:  #1 diabetes, questionable status.   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 Diagnosis of diastolic dysfunction could be reassessed with 2 D ECHO.  Plan: I recommended assessment by a nonoperative back specialist to optimally determine disability rating related to this.  Additionally will talk to her primary care provider concerning pursuing a cardiac evaluation.

## 2014-12-21 NOTE — Progress Notes (Signed)
Pre visit review using our clinic review tool, if applicable. No additional management support is needed unless otherwise documented below in the visit note. 

## 2014-12-22 ENCOUNTER — Other Ambulatory Visit: Payer: Self-pay | Admitting: Internal Medicine

## 2014-12-22 DIAGNOSIS — E1142 Type 2 diabetes mellitus with diabetic polyneuropathy: Secondary | ICD-10-CM

## 2014-12-23 ENCOUNTER — Encounter: Payer: Self-pay | Admitting: Internal Medicine

## 2015-01-30 DIAGNOSIS — G541 Lumbosacral plexus disorders: Secondary | ICD-10-CM | POA: Diagnosis not present

## 2015-01-30 DIAGNOSIS — G603 Idiopathic progressive neuropathy: Secondary | ICD-10-CM | POA: Diagnosis not present

## 2015-01-30 DIAGNOSIS — M545 Low back pain: Secondary | ICD-10-CM | POA: Diagnosis not present

## 2015-01-30 DIAGNOSIS — G8929 Other chronic pain: Secondary | ICD-10-CM | POA: Diagnosis not present

## 2015-02-12 ENCOUNTER — Telehealth: Payer: Self-pay | Admitting: Family

## 2015-02-12 ENCOUNTER — Encounter: Payer: Self-pay | Admitting: Internal Medicine

## 2015-02-12 DIAGNOSIS — Z9189 Other specified personal risk factors, not elsewhere classified: Secondary | ICD-10-CM

## 2015-02-12 NOTE — Telephone Encounter (Signed)
Patient is wanting to get a colonoscopy. Does she need to come to see you or can you just put a referral in? Please advise

## 2015-02-15 NOTE — Telephone Encounter (Signed)
Made referral. LVM letting pt know.

## 2015-02-18 ENCOUNTER — Telehealth: Payer: Self-pay

## 2015-02-18 NOTE — Telephone Encounter (Signed)
Call to the patient to check to be sure she has been on medicare for at least 12 months. Stated yes, she has been on medicare; doesn't remember when she first rec'd her red/white and blu card but did have medicare last year;  May have been 2013. Agreed to come in for apt on Wed at 2:30 instead of 2:15.  Scheduled changed

## 2015-02-20 ENCOUNTER — Ambulatory Visit: Payer: Medicare Other

## 2015-02-22 DIAGNOSIS — G8929 Other chronic pain: Secondary | ICD-10-CM | POA: Diagnosis not present

## 2015-02-22 DIAGNOSIS — M545 Low back pain: Secondary | ICD-10-CM | POA: Diagnosis not present

## 2015-02-26 ENCOUNTER — Telehealth: Payer: Self-pay

## 2015-02-26 NOTE — Telephone Encounter (Signed)
Call to Lindsay Hardy to reschedule AWV; Agreed to come in tomorrow 09/21 at 2pm.

## 2015-02-27 ENCOUNTER — Ambulatory Visit: Payer: Medicare Other

## 2015-02-28 ENCOUNTER — Telehealth: Payer: Self-pay

## 2015-02-28 ENCOUNTER — Ambulatory Visit (INDEPENDENT_AMBULATORY_CARE_PROVIDER_SITE_OTHER): Payer: Medicare Other

## 2015-02-28 VITALS — BP 150/90 | Ht 65.5 in | Wt 222.5 lb

## 2015-02-28 DIAGNOSIS — Z0000 Encounter for general adult medical examination without abnormal findings: Secondary | ICD-10-CM

## 2015-02-28 DIAGNOSIS — E669 Obesity, unspecified: Secondary | ICD-10-CM

## 2015-02-28 NOTE — Progress Notes (Addendum)
Subjective:   Lindsay Hardy is a 64 y.o. female who presents for Medicare Annual (Subsequent) preventive examination.  Review of Systems:   HRA assessment completed during visit; Lindsay Hardy is here for Annual Wellness Assessment The Hardy was informed that this wellness visit is to identify risk and educate on how to reduce risk for increase disease through lifestyle changes.   ROS deferred to CPE exam with physician Hx CAD; CVD; HTN managed by Norvasc Hyperlipidemia; controlled via medical management; Reviewed for lifestyle changes DM2 with A1c 6.9 down to 6.3; on Metformin and glipizide  The Hardy states the metformin was stopped due to hypoglycemia; will check with Marcos Eke;   PAIN Pain in back and knees; level 8 and 1/3 related to back and 2/3 related to knees; Need work on back; PT to knees helped some as she was unable to pick up her foot on the right; Goes to the pain center; [name]; had hx of epidural for back x1 and this did help;  pain is centralized to back;  States Chiropractor got her up to walking and did help, but could not afford copays to continue  Only seen ortho x 1 for knees; "mild oa" / can't identify ortho? Not sure of name  FALLS Admits to multiple falls in home and out this year. One was going up stairs. Discussed hospital bed downstairs if this is an option; as she cannot go up and down stairs and this worsen's pain. States she was in rental home x 15 years and the landlord decided to sell and she had to move abruptly. Other option is [facility] with  downstairs apt; Feels the staff at her current place will assist her in moving and let her out of her contract.   Larey Seat outside at home; on stairs; Discussed fall risk and possibility of w/c to assist her with ADL's and to safely get around home. At present, does none of her IADL's and has aide for ADL assistance.  The Hardy agreed  to speak to the pain clinic about a w/c during her  10/14 apt. May need hospital bed downstairs if she has to remain in current home.    BMI: 36.9 referred to [facility] management program at [facility]; breakfast cereal with banana; 2 slices of bacan and egg; Lunch; sandwich; supper; sometimes eats out or someone brings her food; may have a sandwich; soup  Exercise; Nephew just started going to [employer] and can perhaps make arrangements to take the Hardy for core strengthening and modified exercise Given resources for [facility] as they have exercise activities  SAFETY Stopped going upstairs to bedroom; CNA comes out and helps here; Just moved there; Just signed a years' lease. CNA does come x 7 days a week; 1hour and Her nephew or son does her shopping; due to knees; Pays her own bills;  Manages medicine; Medicaid assist with meds;  railing as needed; bathroom safety; community safety; smoke detectors and firearms safety as well as sun protection when out Driving accidents; no she is not driving now; doesn't have a car and seatbelt   Medication review/ check metformin which the Hardy states she has stopped  Mobilization and Functional losses in the last year. States she is a little better than last year. No specifics given   Urinary or fecal incontinence reviewed; uses med for constipation  Counseling: Ua microalbumin; reviewed Hep C; reviewed Colonoscopy; recent call to have colonoscopy / TBS soon  EKG 03/03/2014  Dexa scan; Educated Mammogram 10/2013There are scattered fibroglandular densities. No suspicious masses  [facility]/ will have this done at some point at [facility] this year Hearing:  both ears Ophthalmology exam; once a year; 05/2014; no glaucoma or other issues  Dr. Orvan Falconer, timothy  Immunizations Due /  outpatient clinic Zostavax; educated on Part D coverage Tetanus shot; think about taking these PCV 13; will think about this Flu / felt she had reaction and is afraid to  take another one.  Refuses all immunizations at this time. Refused Dexa scan        Objective:     Vitals: BP 150/90 mmHg  Ht 5' 5.5" (1.664 m)  Wt 222 lb 8 oz (100.925 kg)  BMI 36.45 kg/m2  Tobacco History  Smoking status  . Former Smoker -- 0.25 packs/day for 4 years  . Types: Cigarettes  Smokeless tobacco  . Never Used    Comment: smoked in 20's      Counseling given: Yes   Past Medical History  Diagnosis Date  . CAD (coronary artery disease) 11/2003     RCA 60% stenosis, LAD diffuse  . CVA (cerebrovascular accident) 2001     right frontoparietal cortical CVA - MRI September 2001  . Asthma   . Diabetes mellitus   . GERD (gastroesophageal reflux disease)      EGD June 2004 -  followed by Dr. Leone Payor  . Hyperlipidemia   . Hypertension   . OA (osteoarthritis)      DJP coracoclavicular ligament, left patella osteophytes June 2002, L4-S1 spondylosis, degenerative disc disease with bulge. I in October 2001, the C2-C4 spondylosis without stenosis and with spurs based on x-ray July 2005; diffuse idiopathic skeletal hyperostosis with bilateral hip lumbar degenerative changes   on x-ray February 2005, bilateral plantar calcaneal spurs  . Diastolic dysfunction      ejection fraction 55-65% on 2-D echo May 2006  . Empty sella syndrome      based on MRI September 2001, related to obesity, hypertension, it was determined that Hardy has not required treatment but will be monitored with TSH, ACTH, cortisol, testosterone, prolactin, growth hormone  . NAFLD (nonalcoholic fatty liver disease)      based on ultrasound June 2002, elevated transaminase  . Reactive airway disease      peak flow 180 in May 2005, chronic sinusitis with PND, bronchitis December 2002 and strep pneumonia April 2007,  FEC 66, MCV 60, ratio is 69, DLCO 57,  moderate restrictive and mild obstructive disease  on spirometry May 2005  . Granulomatous disease, chronic      in right upper lobe per x-ray December  2005  . Chicken pox    Past Surgical History  Procedure Laterality Date  . Cholecystectomy  2003  . Carpal tunnel release  1992     left arm 1992  . Tonsillectomy and adenoidectomy     Family History  Problem Relation Age of Onset  . Lung cancer Mother   . Hypertension Mother   . Lung cancer Sister   . Hypertension Sister    History  Sexual Activity  . Sexual Activity: Not Currently  . Birth Control/ Protection: Post-menopausal    Outpatient Encounter Prescriptions as of 02/28/2015  Medication Sig  . albuterol (PROAIR HFA) 108 (90 BASE) MCG/ACT inhaler Inhale 2 puffs into the lungs every 4 (four) hours as needed for shortness of breath.  Marland Kitchen amLODipine (NORVASC) 10 MG tablet Take 10 mg by mouth daily.    Marland Kitchen  glipiZIDE (GLUCOTROL) 10 MG tablet 1/2 daily  . HYDROcodone-acetaminophen (NORCO/VICODIN) 5-325 MG per tablet Take 1 tablet by mouth 3 (three) times daily as needed (for pain).   Marland Kitchen LINZESS 145 MCG CAPS capsule TAKE 1 CAPSULE BY MOUTH EVERY MORNING  . naproxen (NAPROSYN) 500 MG tablet Take 1 tablet (500 mg total) by mouth 2 (two) times daily with a meal.  . potassium chloride (K-DUR) 10 MEQ tablet Take 1 tablet (10 mEq total) by mouth 2 (two) times daily.  Marland Kitchen venlafaxine XR (EFFEXOR-XR) 75 MG 24 hr capsule Take 75 mg by mouth daily.  . meloxicam (MOBIC) 7.5 MG tablet TAKE 1 TO 2 TABLETS BY MOUTH DAILY (Hardy not taking: Reported on 02/28/2015)  . metFORMIN (GLUCOPHAGE) 500 MG tablet Take 1 tablet (500 mg total) by mouth 2 (two) times daily with a meal. (Hardy not taking: Reported on 02/28/2015)  . omeprazole (PRILOSEC) 20 MG capsule Take 20 mg by mouth 2 (two) times daily as needed (for reflux).   . potassium chloride SA (K-DUR,KLOR-CON) 20 MEQ tablet TAKE 2 TABLETS BY MOUTH TWICE DAILY FOR 3 DAYS THEN TAKE 1 TABLET BY MOUTH DAILY (Hardy not taking: Reported on 02/28/2015)   No facility-administered encounter medications on file as of 02/28/2015.    Activities of Daily  Living In your present state of health, do you have any difficulty performing the following activities: 02/28/2015  Hearing? N  Vision? N  Difficulty concentrating or making decisions? N  Walking or climbing stairs? Y  Dressing or bathing? N  Doing errands, shopping? N  Preparing Food and eating ? Y  Using the Toilet? N  In the past six months, have you accidently leaked urine? N  Do you have problems with loss of bowel control? N  Managing your Medications? N  Managing your Finances? N  Housekeeping or managing your Housekeeping? N    Hardy Care Team: Veryl Speak, FNP as PCP - General (Family Medicine)    Assessment:    Assessment   Today Hardy counseled on age appropriate routine health concerns for screening and prevention, each reviewed and the Hardy declined dexa; will have mammogram and colonoscopy this year.  Immunizations reviewed and declined for now. Labs deferred for CPE or medical fup by MD.  Risk factors for depression reviewed and negative but admits to loneliness; Referrals for community services given including food pantry. Hearing function and visual acuity are intact. Vision exam due in 05/2015. ADLs screened and addressed as needed. Functional ability and level of safety reviewed and appropriate referrals given with plan of care established to reduce the risk of falls.  Educated on memory loss and AD8  Score 0. Education, counseling and referrals performed based on assessed risks today.   Hardy denies depression but states she is Lonely, especially since her move. Given community resources as well as information on [employer] in case she needs assistance in coping with disability.   Hardy provided with a copy of personalized plan for preventive services and due dates  HEPATITIS SCREEN reviewed; no risk identified; states she is not sexually active; reviewed Hep c screening and Over due HIV screening and can discuss with Joaquim Lai.  TOBACCO limited in  her 20's /ETOH or other DRUG use was negative  BP was elevated today; did not have an opportunity to recheck due to thorough assessment but did refer to Diabetes center for nutritional management due to 10lb weight gain recently since her move   Exercise Activities and Dietary recommendations  To join silver sneakers; Nephew to provide transportation as he is going to the class    Goals    . goal reduce fall risk     Will speak with the doctor or pain center regarding a w/c  Will then discuss need to move with landlord; to an apt with one level  If she cannot move, may consider hospital bed to reduce fall risk  Due to multiple falls      Fall Risk Fall Risk  02/28/2015 12/21/2014 08/06/2014  Falls in the past year? Yes No Yes  Number falls in past yr: - - 2 or more  Injury with Fall? - - Yes  Risk Factor Category  - - High Fall Risk  Risk for fall due to : Impaired balance/gait - Other (Comment)  Risk for fall due to (comments): see documentation - -   Depression Screen PHQ 2/9 Scores 02/28/2015 12/21/2014 08/06/2014  PHQ - 2 Score 0 0 0     Cognitive Testing No flowsheet data found.  Immunization History  Administered Date(s) Administered  . Influenza Whole 04/15/2006, 04/20/2007, 03/15/2008   Screening Tests Health Maintenance  Topic Date Due  . Hepatitis C Screening  04-03-1951  . OPHTHALMOLOGY EXAM  05/31/1959  . HIV Screening  05/30/1964  . TETANUS/TDAP  05/30/1968  . COLONOSCOPY  05/31/1999  . URINE MICROALBUMIN  09/26/2008  . ZOSTAVAX  05/30/2009  . MAMMOGRAM  03/24/2014  . DEXA SCAN  05/30/2014  . PNA vac Low Risk Adult (1 of 2 - PCV13) 05/30/2014  . INFLUENZA VACCINE  05/10/2015 (Originally 01/07/2015)  . FOOT EXAM  05/17/2015  . HEMOGLOBIN A1C  06/23/2015  . PAP SMEAR  02/28/2017      Plan:     1. Will discuss POW or w/c if falls continue as PT did not seem to help with walking; secondary to knee pain 2. Will schedule apt with Dr. Lawerance Bach since she needs MD  to provider her paper work per the Hardy  May consider moving to 1st floor for safety if she cannot move to 1st floor; one level apt.   It was difficult to assess meds as the Hardy states the metformin was dc and then stated it may have needed to be refilled. She is still taking the Glipizide 10 qd. She did not bring her meds in for AWV  Diabetes and weight loss; (medical Nutrition therapy)  [email or web address] [facility] hospital number [address];   [employer]; [phone]  [facility]; [phone] [facility] Program [phone]  [email or web address]  [facility] Reservation Line: [phone]  [facility] [phone]  [employer]: [email or web address]; [phone]   Call Y; with insurance with [employer] to join silver sneakers   During the course of the visit the Hardy was educated and counseled about the following appropriate screening and preventive services:   Vaccines to include Pneumoccal, Influenza, Hepatitis B, Td, Zostavax, HCV  Declined all vacinnations  Electrocardiogram/ 02/2014  Cardiovascular Disease/ deferred but educated on risk with DM and HTN  Colorectal cancer screening/ in process per the Hardy  Bone density screening/ declined  Diabetes screening/ 6.3 in June  Glaucoma screening/ seen 05/2015 and was neg (Dr. Danton Clap)  Mammography/PAP / [facility]  Nutrition counseling / referred to [facility] program  Hardy Instructions (the written plan) was given to the Hardy.    Outreach to the Hardy this pm and she confirmed Glipizide  bid and stopped Metformin x 5  to 6 months ago; one episode of hypoglycemia last week; did not check BS but ate; Requested she record her BS bid;   Hauck,Susan, RN  02/28/2015   Medical screening examination/treatment/procedure(s)  were performed by non-physician practitioner and as supervising providerI was immediately available for consultation/collaboration. I agree with above. Jeanine Luz, FNP

## 2015-02-28 NOTE — Patient Instructions (Addendum)
Ms. Lindsay Hardy , Thank you for taking time to come for your Medicare Wellness Visit. I appreciate your ongoing commitment to your health goals. Please review the following plan we discussed and let me know if I can assist you in the future.  1. Will discuss POW or w/c with pain center  2. Will schedule apt with [name]  May consider hospital bed if she cannot move to a one level home.  Fup on Metformin being dc  Diabetes and weight loss; (medical Nutrition therapy)  [email or web address] [facility] hospital number [address]   [employer]; [phone]  [address] [place] [employer]; [phone]  [email or web address]  [place]: [place]  [employer]: [email or web address]; [phone]   Call Y; with insurance with [employer] to join silver sneakers   These are the goals we discussed: Goals    . goal reduce fall risk     Will speak with the doctor or pain center regarding a w/c  Will then discuss need to move with landlord; to an apt with one level  If she cannot move, may consider hospital bed to reduce fall risk  Due to multiple falls       This is a list of the screening recommended for you and due dates:  Health Maintenance  Topic Date Due  .  Hepatitis C: One time screening is recommended by Center for Disease Control  (CDC) for  adults born from [birth year] through 1967.   1951-01-12  . Eye exam for diabetics  05/31/1959  . HIV Screening  05/30/1964  . Tetanus Vaccine  05/30/1968  . Colon Cancer Screening  05/31/1999  . Urine Protein Check  09/26/2008  . Shingles Vaccine  05/30/2009  . Mammogram  03/24/2014  . DEXA scan (bone density measurement)  05/30/2014  . Pneumonia vaccines (1 of 2 - PCV13) 05/30/2014  . Flu Shot  05/10/2015*  . Complete foot exam   05/17/2015  . Hemoglobin A1C   06/23/2015  . Pap Smear  02/28/2017  *Topic was postponed. The date shown is not the original due date.   Health Maintenance Adopting a healthy lifestyle and getting preventive care can go a long way to promote health and wellness. Talk with your health care provider about what schedule of regular examinations is right for you. This is a good chance for you to check in with your provider about disease prevention and staying healthy. In between checkups, there are plenty of things you can do on your own. Experts have done a lot of research about which lifestyle changes and preventive measures are most likely to keep you healthy. Ask your health care provider for more information. WEIGHT AND DIET  Eat a healthy diet  Be sure to include plenty of vegetables, fruits, low-fat dairy products, and lean protein.  Do not eat a lot of foods high in solid fats, added sugars, or salt.  Get regular exercise. This is one of the most important things you can do for your health.  Most adults should exercise for at least 150 minutes each week. The exercise should increase your heart rate and make you sweat (moderate-intensity exercise).  Most adults should also do strengthening exercises at least twice a week. This is in addition to the moderate-intensity exercise.  Maintain a healthy weight  Body mass index (BMI) is a measurement that can be used to identify possible weight problems. It estimates body fat based on height and weight. Your health care  provider can help determine your BMI and help you achieve or maintain a healthy weight.  For females 45 years of age and older:   A BMI below 18.5 is considered underweight.  A BMI of 18.5 to 24.9 is normal.  A BMI of 25 to 29.9 is considered overweight.  A BMI of 30 and above is considered obese.  Watch levels of cholesterol and blood lipids  You should start having your blood tested for lipids and cholesterol at 66 years of age, then have this test  every 5 years.  You may need to have your cholesterol levels checked more often if:  Your lipid or cholesterol levels are high.  You are older than 66 years of age.  You are at high risk for heart disease.  CANCER SCREENING   Lung Cancer  Lung cancer screening is recommended for adults 20-79 years old who are at high risk for lung cancer because of a history of smoking.  A yearly low-dose CT scan of the lungs is recommended for people who:  Currently smoke.  Have quit within the past 15 years.  Have at least a 30-pack-year history of smoking. A pack year is smoking an average of one pack of cigarettes a day for 1 year.  Yearly screening should continue until it has been 15 years since you quit.  Yearly screening should stop if you develop a health problem that would prevent you from having lung cancer treatment.  Breast Cancer  Practice breast self-awareness. This means understanding how your breasts normally appear and feel.  It also means doing regular breast self-exams. Let your health care provider know about any changes, no matter how small.  If you are in your 20s or 30s, you should have a clinical breast exam (CBE) by a health care provider every 1-3 years as part of a regular health exam.  If you are 1 or older, have a CBE every year. Also consider having a breast X-ray (mammogram) every year.  If you have a family history of breast cancer, talk to your health care provider about genetic screening.  If you are at high risk for breast cancer, talk to your health care provider about having an MRI and a mammogram every year.  Breast cancer gene (BRCA) assessment is recommended for women who have family members with BRCA-related cancers. BRCA-related cancers include:  Breast.  Ovarian.  Tubal.  Peritoneal cancers.  Results of the assessment will determine the need for genetic counseling and BRCA1 and BRCA2 testing. Cervical Cancer Routine pelvic  examinations to screen for cervical cancer are no longer recommended for nonpregnant women who are considered low risk for cancer of the pelvic organs (ovaries, uterus, and vagina) and who do not have symptoms. A pelvic examination may be necessary if you have symptoms including those associated with pelvic infections. Ask your health care provider if a screening pelvic exam is right for you.   The Pap test is the screening test for cervical cancer for women who are considered at risk.  If you had a hysterectomy for a problem that was not cancer or a condition that could lead to cancer, then you no longer need Pap tests.  If you are older than 65 years, and you have had normal Pap tests for the past 10 years, you no longer need to have Pap tests.  If you have had past treatment for cervical cancer or a condition that could lead to cancer, you need Pap tests and screening  for cancer for at least 20 years after your treatment.  If you no longer get a Pap test, assess your risk factors if they change (such as having a new sexual partner). This can affect whether you should start being screened again.  Some women have medical problems that increase their chance of getting cervical cancer. If this is the case for you, your health care provider may recommend more frequent screening and Pap tests.  The human papillomavirus (HPV) test is another test that may be used for cervical cancer screening. The HPV test looks for the virus that can cause cell changes in the cervix. The cells collected during the Pap test can be tested for HPV.  The HPV test can be used to screen women 28 years of age and older. Getting tested for HPV can extend the interval between normal Pap tests from three to five years.  An HPV test also should be used to screen women of any age who have unclear Pap test results.  After 66 years of age, women should have HPV testing as often as Pap tests.  Colorectal Cancer  This type of  cancer can be detected and often prevented.  Routine colorectal cancer screening usually begins at 66 years of age and continues through 67 years of age.  Your health care provider may recommend screening at an earlier age if you have risk factors for colon cancer.  Your health care provider may also recommend using home test kits to check for hidden blood in the stool.  A small camera at the end of a tube can be used to examine your colon directly (sigmoidoscopy or colonoscopy). This is done to check for the earliest forms of colorectal cancer.  Routine screening usually begins at age 44.  Direct examination of the colon should be repeated every 5-10 years through 66 years of age. However, you may need to be screened more often if early forms of precancerous polyps or small growths are found. Skin Cancer  Check your skin from head to toe regularly.  Tell your health care provider about any new moles or changes in moles, especially if there is a change in a mole's shape or color.  Also tell your health care provider if you have a mole that is larger than the size of a pencil eraser.  Always use sunscreen. Apply sunscreen liberally and repeatedly throughout the day.  Protect yourself by wearing long sleeves, pants, a wide-brimmed hat, and sunglasses whenever you are outside. HEART DISEASE, DIABETES, AND HIGH BLOOD PRESSURE   Have your blood pressure checked at least every 1-2 years. High blood pressure causes heart disease and increases the risk of stroke.  If you are between 21 years and 61 years old, ask your health care provider if you should take aspirin to prevent strokes.  Have regular diabetes screenings. This involves taking a blood sample to check your fasting blood sugar level.  If you are at a normal weight and have a low risk for diabetes, have this test once every three years after 66 years of age.  If you are overweight and have a high risk for diabetes, consider being  tested at a younger age or more often. PREVENTING INFECTION  Hepatitis B  If you have a higher risk for hepatitis B, you should be screened for this virus. You are considered at high risk for hepatitis B if:  You were born in a country where hepatitis B is common. Ask your health care  provider which countries are considered high risk.  Your parents were born in a high-risk country, and you have not been immunized against hepatitis B (hepatitis B vaccine).  You have HIV or AIDS.  You use needles to inject street drugs.  You live with someone who has hepatitis B.  You have had sex with someone who has hepatitis B.  You get hemodialysis treatment.  You take certain medicines for conditions, including cancer, organ transplantation, and autoimmune conditions. Hepatitis C  Blood testing is recommended for:  Everyone born from [birth year] through 1967.  Anyone with known risk factors for hepatitis C. Sexually transmitted infections (STIs)  You should be screened for sexually transmitted infections (STIs) including gonorrhea and chlamydia if:  You are sexually active and are younger than 66 years of age.  You are older than 66 years of age and your health care provider tells you that you are at risk for this type of infection.  Your sexual activity has changed since you were last screened and you are at an increased risk for chlamydia or gonorrhea. Ask your health care provider if you are at risk.  If you do not have HIV, but are at risk, it may be recommended that you take a prescription medicine daily to prevent HIV infection. This is called pre-exposure prophylaxis (PrEP). You are considered at risk if:  You are sexually active and do not regularly use condoms or know the HIV status of your partner(s).  You take drugs by injection.  You are sexually active with a partner who has HIV. Talk with your health care provider about whether you are at high risk of being infected with HIV. If  you choose to begin PrEP, you should first be tested for HIV. You should then be tested every 3 months for as long as you are taking PrEP.  PREGNANCY   If you are premenopausal and you may become pregnant, ask your health care provider about preconception counseling.  If you may become pregnant, take 400 to 800 micrograms (mcg) of folic acid every day.  If you want to prevent pregnancy, talk to your health care provider about birth control (contraception). OSTEOPOROSIS AND MENOPAUSE   Osteoporosis is a disease in which the bones lose minerals and strength with aging. This can result in serious bone fractures. Your risk for osteoporosis can be identified using a bone density scan.  If you are 61 years of age or older, or if you are at risk for osteoporosis and fractures, ask your health care provider if you should be screened.  Ask your health care provider whether you should take a calcium or vitamin D supplement to lower your risk for osteoporosis.  Menopause may have certain physical symptoms and risks.  Hormone replacement therapy may reduce some of these symptoms and risks. Talk to your health care provider about whether hormone replacement therapy is right for you.  HOME CARE INSTRUCTIONS   Schedule regular health, dental, and eye exams.  Stay current with your immunizations.   Do not use any tobacco products including cigarettes, chewing tobacco, or electronic cigarettes.  If you are pregnant, do not drink alcohol.  If you are breastfeeding, limit how much and how often you drink alcohol.  Limit alcohol intake to no more than 1 drink per day for nonpregnant women. One drink equals 12 ounces of beer, 5 ounces of wine, or 1 ounces of hard liquor.  Do not use street drugs.  Do not share needles.  Ask  your health care provider for help if you need support or information about quitting drugs.  Tell your health care provider if you often feel depressed.  Tell your health  care provider if you have ever been abused or do not feel safe at home. Document Released: 12/08/2010 Document Revised: 10/09/2013 Document Reviewed: 04/26/2013 [facility] Patient Information 2015 [place], [place]. This information is not intended to replace advice given to you by your health care provider. Make sure you discuss any questions you have with your health care provider.

## 2015-02-28 NOTE — Telephone Encounter (Signed)
Call to the patient once home and requested she review her pill bottles for diabetes meds  States after looking at bottles;  Glipizide  one tablet po bid / Is not breaking this in half; Metformin (  ) bid; she is not taking this x 5 to 6 months; States she had stopped it prior to last A1c.   Low blood sugar last week; ate food and did not check BS FBS are under 150/ asked her to take her BS bid and record and to have sugar available upstairs if she needs it.

## 2015-02-28 NOTE — Telephone Encounter (Signed)
AWV today; the patient states she is taking Glipizide  and stopped metformin because she was having low bs, but then stated she thinks she just ran out of the rx.  It looks like she needs a refill on the metformin. Stated if you want her on it, to call it in and she will take it.  It looks like you dc the glipizide at the last visit;   Please advise patient and refill as appropriate.

## 2015-03-04 DIAGNOSIS — E119 Type 2 diabetes mellitus without complications: Secondary | ICD-10-CM | POA: Diagnosis not present

## 2015-03-12 ENCOUNTER — Telehealth: Payer: Self-pay | Admitting: Family

## 2015-03-12 NOTE — Telephone Encounter (Signed)
Patient is requesting to transfer from [facility] to Dr. Cheryll Cockayne.

## 2015-03-13 NOTE — Telephone Encounter (Signed)
That is fine with me.

## 2015-03-13 NOTE — Telephone Encounter (Signed)
Ok with me 

## 2015-03-18 DIAGNOSIS — K5909 Other constipation: Secondary | ICD-10-CM | POA: Diagnosis not present

## 2015-03-18 DIAGNOSIS — Z79891 Long term (current) use of opiate analgesic: Secondary | ICD-10-CM | POA: Diagnosis not present

## 2015-03-18 DIAGNOSIS — M25561 Pain in right knee: Secondary | ICD-10-CM | POA: Diagnosis not present

## 2015-03-18 DIAGNOSIS — M545 Low back pain: Secondary | ICD-10-CM | POA: Diagnosis not present

## 2015-03-18 DIAGNOSIS — M25562 Pain in left knee: Secondary | ICD-10-CM | POA: Diagnosis not present

## 2015-03-18 DIAGNOSIS — G8929 Other chronic pain: Secondary | ICD-10-CM | POA: Diagnosis not present

## 2015-03-22 ENCOUNTER — Ambulatory Visit (INDEPENDENT_AMBULATORY_CARE_PROVIDER_SITE_OTHER): Payer: Medicare Other | Admitting: Internal Medicine

## 2015-03-22 ENCOUNTER — Encounter: Payer: Self-pay | Admitting: Internal Medicine

## 2015-03-22 VITALS — BP 134/78 | HR 87 | Temp 98.7°F | Resp 16 | Wt 228.0 lb

## 2015-03-22 DIAGNOSIS — M25561 Pain in right knee: Secondary | ICD-10-CM | POA: Diagnosis not present

## 2015-03-22 DIAGNOSIS — M25562 Pain in left knee: Secondary | ICD-10-CM

## 2015-03-22 DIAGNOSIS — E785 Hyperlipidemia, unspecified: Secondary | ICD-10-CM

## 2015-03-22 DIAGNOSIS — M545 Low back pain, unspecified: Secondary | ICD-10-CM

## 2015-03-22 DIAGNOSIS — I10 Essential (primary) hypertension: Secondary | ICD-10-CM

## 2015-03-22 DIAGNOSIS — E1143 Type 2 diabetes mellitus with diabetic autonomic (poly)neuropathy: Secondary | ICD-10-CM | POA: Diagnosis not present

## 2015-03-22 DIAGNOSIS — M1712 Unilateral primary osteoarthritis, left knee: Secondary | ICD-10-CM

## 2015-03-22 NOTE — Progress Notes (Signed)
Subjective:    Patient ID: Lindsay Hardy, female    DOB: [birth year], 64 y.o.   MRN: [id number]  HPI She is here to establish with a new pcp.  Diabetes: She is taking her medication daily as prescribed. She is not compliant with a diabetic diet. She is not exercising regularly. She monitors her sugars and they have been running less than 150. She checks her feet daily and denies foot lesions. She denies numbness/tingling in her feet.  She is up-to-date with an ophthalmology examination.  She has gained weight. She was referred to nutrition.  Hypertension: She is taking her medication daily. She is not compliant with a low sodium diet.  She has occasional palpitations.  She denies chest pain, edema, shortness of breath and regular headaches. She is not exercising regularly.  She does not monitor her blood pressure at home.   Knee pain:  She started having knee pain after a fall 05/2014.  She fell onto both knees.  She did see orthopedics and was told she had mild arthritis.  She has done PT for her knees and back.  The PT helped her knee pain and leg strength.    Back pain:  She started having back pain around Feb 2015 - it just started for no reason.  She saw an orthopedic and was told she did not have a pinched nerve in her back.    She follows with the pain clinic for her knee and back pain.  She takes hydrocodone and ibuprofen daily.  She follows at the pain clinic and they prescribe her hydrocodone.  She also takes ibuprofen.  She is afraid of falling.   She is currently living in an apartment and has to go up and down steps.  She has fallen and is very afraid of falling.  She often uses a cane to ambulate or goes with someone else.  She lives alone.  She would like to break her lease so that she can get an apartment on the first floor.   She does not work.  She was online classes but had to stop because she was not able to sit up for long periods of time.  Since doing PT she can sit up for  longer periods of time.  She does the PT exercises at home on occasion.  She can only stand in one place for 5 minutes, but can be on her feet longer if she is moving around.  She can walk by herself for short distances.  She sometimes uses a cane.  She can sit for longer periods of time since PT.     Medications and allergies reviewed with patient and updated if appropriate.  Patient Active Problem List   Diagnosis Date Noted  . Encounter for completion of form with patient 12/17/2014  . Knee contusion 06/26/2014  . Low back pain 05/09/2014  . 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 HYPERLIPIDEMIA 06/17/2006  . Essential hypertension 06/17/2006  . CORONARY ARTERY DISEASE 06/17/2006  . DIASTOLIC DYSFUNCTION 06/17/2006  . DISEASE, CEREBROVASCULAR NEC 06/17/2006  . GERD 06/17/2006  . Osteoarthritis of left knee 06/17/2006    Past Medical History  Diagnosis Date  . CAD (coronary artery disease) 11/2003     RCA 60% stenosis, LAD diffuse  . CVA (cerebrovascular accident) [facility]) 2001     right frontoparietal cortical CVA - MRI September 2001  . Asthma   . Diabetes mellitus   . GERD (  gastroesophageal reflux disease)      EGD June 2004 -  followed by Dr. Leone PayorGessner  . Hyperlipidemia   . Hypertension   . OA (osteoarthritis)      DJP coracoclavicular ligament, left patella osteophytes June 2002, L4-S1 spondylosis, degenerative disc disease with bulge. I in October 2001, the C2-C4 spondylosis without stenosis and with spurs based on x-ray July 2005; diffuse idiopathic skeletal hyperostosis with bilateral hip lumbar degenerative changes   on x-ray February 2005, bilateral plantar calcaneal spurs  . Diastolic dysfunction      ejection fraction 55-65% on 2-D echo May 2006  . Empty sella syndrome [facility](HCC)      based on MRI September 2001, related to obesity, hypertension, it was determined that patient has not required treatment but will be monitored with TSH,  ACTH, cortisol, testosterone, prolactin, growth hormone  . NAFLD (nonalcoholic fatty liver disease)      based on ultrasound June 2002, elevated transaminase  . Reactive airway disease      peak flow 180 in May 2005, chronic sinusitis with PND, bronchitis December 2002 and strep pneumonia April 2007,  FEC 66, MCV 60, ratio is 69, DLCO 57,  moderate restrictive and mild obstructive disease  on spirometry May 2005  . Granulomatous disease, chronic (HCC)      in right upper lobe per x-ray December 2005  . Chicken pox     Past Surgical History  Procedure Laterality Date  . Cholecystectomy  2003  . Carpal tunnel release  1992     left arm 1992  . Tonsillectomy and adenoidectomy      Social History   Social History  . Marital Status: Single    Spouse Name: N/A  . Number of Children: 1  . Years of Education: 14   Occupational History  .  fromer nursing assistant    Social History Main Topics  . Smoking status: Former Smoker -- 0.25 packs/day for 4 years    Types: Cigarettes  . Smokeless tobacco: Never Used     Comment: smoked in 20's   . Alcohol Use: No  . Drug Use: No  . Sexual Activity: Not Currently    Birth Control/ Protection: Post-menopausal   Other Topics Concern  . None   Social History Narrative   [place] Currently lives in a private residence with her nephew and his wife. Fun: Church activities, spending time with family   Denies any religious beliefs that would effect health care.     Review of Systems  Constitutional: Positive for fatigue. Negative for fever and chills.  Cardiovascular: Positive for palpitations (occasional). Negative for chest pain and leg swelling.  Gastrointestinal: Positive for abdominal pain (when she takes her morning medication - especially metformin - not daily) and constipation (from pain medication).       No gerd  Neurological: Positive for headaches (occasional). Negative for dizziness and  light-headedness.  Psychiatric/Behavioral: Negative for dysphoric mood. The patient is not nervous/anxious.        Objective:   Filed Vitals:   03/22/15 1507  BP: 134/78  Pulse: 87  Temp: 98.7 F (37.1 C)  Resp: 16   Filed Weights   03/22/15 1507  Weight: 228 lb (103.42 kg)   Body mass index is 37.35 kg/(m^2).   Physical Exam  Constitutional: She is oriented to person, place, and time. She appears well-developed and well-nourished. No distress.  HENT:  Head: Normocephalic and atraumatic.  Neck: Neck supple. No tracheal  deviation present. No thyromegaly present.  No carotid bruit  Cardiovascular: Normal rate, regular rhythm and normal heart sounds.   No murmur heard. Pulmonary/Chest: Effort normal and breath sounds normal. No respiratory distress. She has no wheezes.  Musculoskeletal: She exhibits no edema.  Lymphadenopathy:    She has no cervical adenopathy.  Neurological: She is alert and oriented to person, place, and time.  Psychiatric: She has a normal mood and affect. Her behavior is normal.        Assessment & Plan:   See Problem List. Blood work ordered  Follow up in 3 months

## 2015-03-22 NOTE — Patient Instructions (Addendum)
A letter was given to you to break her lease so you can get to an apartment without stairs  We have reviewed your prior records including labs and tests today.  Test(s) ordered today. Your results will be released to MyChart (or called to you) after review, usually within 72hours after test completion. If any changes need to be made, you will be notified at that same time.  No immunizations administered today.   Medications reviewed and updated.  No changes recommended at this time.   A referral for PT was ordered.   Please schedule followup in 3 months.

## 2015-03-22 NOTE — Progress Notes (Signed)
Pre visit review using our clinic review tool, if applicable. No additional management support is needed unless otherwise documented below in the visit note. 

## 2015-03-24 NOTE — Assessment & Plan Note (Signed)
Controlled Last a1c 6.3% in July, will recheck - blood work ordered Metformin was d/c'd, taking glipizide Not compliant with diabetic diet and sedentary Stressed diabetic diet and increasing exercise Encouraged weight loss Follow up every 3 months

## 2015-03-24 NOTE — Assessment & Plan Note (Signed)
Has bilateral knee pain after fall 05/2014 Xrays showed mild arthritis Improved with PT Referred back to PT Encouraged to increase activity and work on weight loss

## 2015-03-24 NOTE — Assessment & Plan Note (Signed)
Chronic, started 07/2013 without obvious cause Lower back without radiation of pain Follows at pain clinic - prescribed ibuprofen and hydrocodone Xrays done and saw ortho - no radiculopathy Stressed increasing activity and working on weight loss Will refer back to PT

## 2015-03-24 NOTE — Assessment & Plan Note (Signed)
BP controlled and at goal of less than 140/90 Continue amlodipine 10mg  daily

## 2015-03-24 NOTE — Assessment & Plan Note (Signed)
Recheck lipid panel Not currently on a statin, did not tolerate lipitor

## 2015-04-04 ENCOUNTER — Ambulatory Visit: Payer: Medicare Other | Admitting: Physical Therapy

## 2015-04-15 ENCOUNTER — Ambulatory Visit: Payer: Medicare Other | Attending: Internal Medicine | Admitting: Physical Therapy

## 2015-04-18 ENCOUNTER — Telehealth: Payer: Self-pay | Admitting: Emergency Medicine

## 2015-04-18 NOTE — Telephone Encounter (Signed)
Spoke with pt to inform Disability Paperwork is ready for pick up

## 2015-04-29 DIAGNOSIS — K5909 Other constipation: Secondary | ICD-10-CM | POA: Diagnosis not present

## 2015-04-29 DIAGNOSIS — M545 Low back pain: Secondary | ICD-10-CM | POA: Diagnosis not present

## 2015-04-29 DIAGNOSIS — M25561 Pain in right knee: Secondary | ICD-10-CM | POA: Diagnosis not present

## 2015-04-29 DIAGNOSIS — G8929 Other chronic pain: Secondary | ICD-10-CM | POA: Diagnosis not present

## 2015-04-29 DIAGNOSIS — M25562 Pain in left knee: Secondary | ICD-10-CM | POA: Diagnosis not present

## 2015-05-27 DIAGNOSIS — M25562 Pain in left knee: Secondary | ICD-10-CM | POA: Diagnosis not present

## 2015-05-27 DIAGNOSIS — Z79891 Long term (current) use of opiate analgesic: Secondary | ICD-10-CM | POA: Diagnosis not present

## 2015-05-27 DIAGNOSIS — G8929 Other chronic pain: Secondary | ICD-10-CM | POA: Diagnosis not present

## 2015-05-27 DIAGNOSIS — M25561 Pain in right knee: Secondary | ICD-10-CM | POA: Diagnosis not present

## 2015-05-27 DIAGNOSIS — G890 Central pain syndrome: Secondary | ICD-10-CM | POA: Diagnosis not present

## 2015-05-27 DIAGNOSIS — M545 Low back pain: Secondary | ICD-10-CM | POA: Diagnosis not present

## 2015-06-11 ENCOUNTER — Other Ambulatory Visit: Payer: Self-pay | Admitting: Emergency Medicine

## 2015-06-11 ENCOUNTER — Telehealth: Payer: Self-pay | Admitting: Internal Medicine

## 2015-06-11 MED ORDER — AMLODIPINE BESYLATE 10 MG PO TABS: 10.0000 mg | ORAL_TABLET | Freq: Every day | ORAL | Status: DC

## 2015-06-11 NOTE — Telephone Encounter (Signed)
Pt need bp med called into [facility] on [address]

## 2015-06-11 NOTE — Telephone Encounter (Signed)
sent 

## 2015-06-24 DIAGNOSIS — M25562 Pain in left knee: Secondary | ICD-10-CM | POA: Diagnosis not present

## 2015-06-24 DIAGNOSIS — M545 Low back pain: Secondary | ICD-10-CM | POA: Diagnosis not present

## 2015-06-24 DIAGNOSIS — G894 Chronic pain syndrome: Secondary | ICD-10-CM | POA: Diagnosis not present

## 2015-06-24 DIAGNOSIS — M25561 Pain in right knee: Secondary | ICD-10-CM | POA: Diagnosis not present

## 2015-06-24 DIAGNOSIS — Z79891 Long term (current) use of opiate analgesic: Secondary | ICD-10-CM | POA: Diagnosis not present

## 2015-06-25 ENCOUNTER — Encounter: Payer: Medicare Other | Admitting: Internal Medicine

## 2015-06-25 NOTE — Progress Notes (Signed)
Subjective:    Patient ID: Lindsay Hardy, female    DOB: 02-02-1951, 65 y.o.   MRN: [id number]  HPI No show   Medications and allergies reviewed with patient and updated if appropriate.  Patient Active Problem List   Diagnosis Date Noted  . Low back pain 05/09/2014  . 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 Hyperlipidemia 06/17/2006  . Essential hypertension 06/17/2006  . CORONARY ARTERY DISEASE 06/17/2006  . DIASTOLIC DYSFUNCTION 06/17/2006  . DISEASE, CEREBROVASCULAR NEC 06/17/2006  . GERD 06/17/2006  . Osteoarthritis of left knee 06/17/2006    Current Outpatient Prescriptions on File Prior to Visit  Medication Sig Dispense Refill  . albuterol (PROAIR HFA) 108 (90 BASE) MCG/ACT inhaler Inhale 2 puffs into the lungs every 4 (four) hours as needed for shortness of breath. 1 Inhaler 2  . amLODipine (NORVASC) 10 MG tablet Take 1 tablet (10 mg total) by mouth daily. 90 tablet 1  . glipiZIDE (GLUCOTROL) 10 MG tablet Take 10 mg by mouth 2 (two) times daily before a meal. 1/2 daily 90 tablet 1  . HYDROcodone-acetaminophen (NORCO) 10-325 MG tablet TAKE 1 TABLET 3 TIMES DAILY.  0  . ibuprofen (ADVIL,MOTRIN) 400 MG tablet TAKE 1 TABLET 3 TIMES DAILY.  0  . LINZESS 145 MCG CAPS capsule TAKE 1 CAPSULE BY MOUTH EVERY MORNING 30 capsule 0  . potassium chloride (K-DUR) 10 MEQ tablet Take 1 tablet (10 mEq total) by mouth 2 (two) times daily. 90 tablet 1  . venlafaxine XR (EFFEXOR-XR) 75 MG 24 hr capsule Take 75 mg by mouth daily.    . VOLTAREN 1 % GEL APPLY 2 GRAMS 3 TIMES DAILY.  0   No current facility-administered medications on file prior to visit.    Past Medical History  Diagnosis Date  . CAD (coronary artery disease) 11/2003     RCA 60% stenosis, LAD diffuse  . CVA (cerebrovascular accident) [facility]) 2001     right frontoparietal cortical CVA - MRI September 2001  . Asthma   . Diabetes mellitus   . GERD (gastroesophageal reflux  disease)      EGD June 2004 -  followed by Dr. Leone Payor  . Hyperlipidemia   . Hypertension   . OA (osteoarthritis)      DJP coracoclavicular ligament, left patella osteophytes June 2002, L4-S1 spondylosis, degenerative disc disease with bulge. I in October 2001, the C2-C4 spondylosis without stenosis and with spurs based on x-ray July 2005; diffuse idiopathic skeletal hyperostosis with bilateral hip lumbar degenerative changes   on x-ray February 2005, bilateral plantar calcaneal spurs  . Diastolic dysfunction      ejection fraction 55-65% on 2-D echo May 2006  . Empty sella syndrome [facility])      based on MRI September 2001, related to obesity, hypertension, it was determined that patient has not required treatment but will be monitored with TSH, ACTH, cortisol, testosterone, prolactin, growth hormone  . NAFLD (nonalcoholic fatty liver disease)      based on ultrasound June 2002, elevated transaminase  . Reactive airway disease      peak flow 180 in May 2005, chronic sinusitis with PND, bronchitis December 2002 and strep pneumonia April 2007,  FEC 66, MCV 60, ratio is 69, DLCO 57,  moderate restrictive and mild obstructive disease  on spirometry May 2005  . Granulomatous disease, chronic (HCC)      in right upper lobe per x-ray December 2005  . Chicken pox  Past Surgical History  Procedure Laterality Date  . Cholecystectomy  2003  . Carpal tunnel release  1992     left arm 1992  . Tonsillectomy and adenoidectomy      Social History   Social History  . Marital Status: Single    Spouse Name: N/A  . Number of Children: 1  . Years of Education: 14   Occupational History  .  fromer nursing assistant    Social History Main Topics  . Smoking status: Former Smoker -- 0.25 packs/day for 4 years    Types: Cigarettes  . Smokeless tobacco: Never Used     Comment: smoked in 20's   . Alcohol Use: No  . Drug Use: No  . Sexual Activity: Not Currently    Birth Control/ Protection:  Post-menopausal   Other Topics Concern  . Not on file   Social History Narrative   Born and raised in [place], [place]. Currently lives in a private residence with her nephew and his wife. Fun: Church activities, spending time with family   Denies any religious beliefs that would effect health care.     Family History  Problem Relation Age of Onset  . Lung cancer Mother   . Hypertension Mother   . Lung cancer Sister   . Hypertension Sister     Review of Systems     Objective:  There were no vitals filed for this visit. There were no vitals filed for this visit. There is no weight on file to calculate BMI.   Physical Exam        Assessment & Plan:    This encounter was created in error - please disregard.

## 2015-07-15 DIAGNOSIS — E119 Type 2 diabetes mellitus without complications: Secondary | ICD-10-CM | POA: Diagnosis not present

## 2015-07-22 ENCOUNTER — Telehealth: Payer: Self-pay | Admitting: Internal Medicine

## 2015-07-22 ENCOUNTER — Encounter: Payer: Self-pay | Admitting: Internal Medicine

## 2015-07-22 ENCOUNTER — Other Ambulatory Visit (INDEPENDENT_AMBULATORY_CARE_PROVIDER_SITE_OTHER): Payer: Medicare Other

## 2015-07-22 ENCOUNTER — Other Ambulatory Visit: Payer: Self-pay | Admitting: Internal Medicine

## 2015-07-22 ENCOUNTER — Ambulatory Visit (INDEPENDENT_AMBULATORY_CARE_PROVIDER_SITE_OTHER): Payer: Medicare Other | Admitting: Internal Medicine

## 2015-07-22 VITALS — BP 122/70 | HR 83 | Temp 98.3°F | Ht 65.5 in | Wt 255.0 lb

## 2015-07-22 DIAGNOSIS — E1142 Type 2 diabetes mellitus with diabetic polyneuropathy: Secondary | ICD-10-CM | POA: Diagnosis not present

## 2015-07-22 DIAGNOSIS — E1143 Type 2 diabetes mellitus with diabetic autonomic (poly)neuropathy: Secondary | ICD-10-CM

## 2015-07-22 DIAGNOSIS — I10 Essential (primary) hypertension: Secondary | ICD-10-CM | POA: Diagnosis not present

## 2015-07-22 DIAGNOSIS — E785 Hyperlipidemia, unspecified: Secondary | ICD-10-CM | POA: Diagnosis not present

## 2015-07-22 LAB — LIPID PANEL
CHOL/HDL RATIO: 4
Cholesterol: 198 mg/dL (ref 0–200)
HDL: 52.8 mg/dL (ref >39.00)
LDL CALC: 114 mg/dL — AB (ref 0–99)
NONHDL: 145.61
Triglycerides: 157 mg/dL — ABNORMAL HIGH (ref 0.0–149.0)
VLDL: 31.4 mg/dL (ref 0.0–40.0)

## 2015-07-22 LAB — HEPATIC FUNCTION PANEL
ALT: 19 U/L (ref 0–35)
AST: 16 U/L (ref 0–37)
Albumin: 4.2 g/dL (ref 3.5–5.2)
Alkaline Phosphatase: 97 U/L (ref 39–117)
BILIRUBIN DIRECT: 0.1 mg/dL (ref 0.0–0.3)
BILIRUBIN TOTAL: 0.4 mg/dL (ref 0.2–1.2)
Total Protein: 8 g/dL (ref 6.0–8.3)

## 2015-07-22 LAB — BASIC METABOLIC PANEL
BUN: 12 mg/dL (ref 6–23)
CALCIUM: 9.6 mg/dL (ref 8.4–10.5)
CHLORIDE: 101 meq/L (ref 96–112)
CO2: 31 mEq/L (ref 19–32)
CREATININE: 0.75 mg/dL (ref 0.40–1.20)
GFR: 99.39 mL/min (ref >60.00)
Glucose, Bld: 115 mg/dL — ABNORMAL HIGH (ref 70–99)
Potassium: 3.7 mEq/L (ref 3.5–5.1)
Sodium: 139 mEq/L (ref 135–145)

## 2015-07-22 LAB — HEMOGLOBIN A1C: Hgb A1c MFr Bld: 8.1 % — ABNORMAL HIGH (ref 4.6–6.5)

## 2015-07-22 MED ORDER — AMLODIPINE BESYLATE 10 MG PO TABS: 10.0000 mg | ORAL_TABLET | Freq: Every day | ORAL | Status: DC

## 2015-07-22 MED ORDER — GLIPIZIDE 10 MG PO TABS: 10.0000 mg | ORAL_TABLET | Freq: Two times a day (BID) | ORAL | Status: DC

## 2015-07-22 MED ORDER — ASPIRIN EC 81 MG PO TBEC: 81.0000 mg | DELAYED_RELEASE_TABLET | Freq: Every day | ORAL | Status: DC

## 2015-07-22 MED ORDER — GLIPIZIDE 10 MG PO TABS
10.0000 mg | ORAL_TABLET | Freq: Two times a day (BID) | ORAL | Status: DC
Start: 2015-07-22 — End: 2015-07-22

## 2015-07-22 MED ORDER — METFORMIN HCL ER 500 MG PO TB24: 500.0000 mg | ORAL_TABLET | Freq: Every day | ORAL | Status: DC

## 2015-07-22 MED ORDER — ROSUVASTATIN CALCIUM 10 MG PO TABS: 10.0000 mg | ORAL_TABLET | Freq: Every day | ORAL | Status: DC

## 2015-07-22 NOTE — Progress Notes (Signed)
Subjective:    Patient ID: Lindsay Hardy, female    DOB: 03/01/1951, 65 y.o.   MRN: [id number]  HPI  Here to f/u with me as PCP is out of town, and she has transporation problems; overall doing ok,  Pt denies chest pain, increasing sob or doe, wheezing, orthopnea, PND, increased LE swelling, palpitations, dizziness or syncope.  Pt denies new neurological symptoms such as new headache, or facial or extremity weakness or numbness.  Pt denies polydipsia, polyuria, or low sugar episode.   Pt denies new neurological symptoms such as new headache, or facial or extremity weakness or numbness.   Pt states overall good compliance with meds, mostly trying to follow appropriate diet, with wt overall stable,  but little exercise however.  Had lost wt last visit with excellent a1c but regained some.  Has been out of glipizide for one wk, hoping to gel all refills done to a new pharmacy as she is moving. Wt Readings from Last 3 Encounters:  07/22/15 255 lb (115.667 kg)  03/22/15 228 lb (103.42 kg)  02/28/15 222 lb 8 oz (100.925 kg)    Past Medical History  Diagnosis Date  . CAD (coronary artery disease) 11/2003     RCA 60% stenosis, LAD diffuse  . CVA (cerebrovascular accident) [facility]) 2001     right frontoparietal cortical CVA - MRI September 2001  . Asthma   . Diabetes mellitus   . GERD (gastroesophageal reflux disease)      EGD June 2004 -  followed by Dr. Leone Payor  . Hyperlipidemia   . Hypertension   . OA (osteoarthritis)      DJP coracoclavicular ligament, left patella osteophytes June 2002, L4-S1 spondylosis, degenerative disc disease with bulge. I in October 2001, the C2-C4 spondylosis without stenosis and with spurs based on x-ray July 2005; diffuse idiopathic skeletal hyperostosis with bilateral hip lumbar degenerative changes   on x-ray February 2005, bilateral plantar calcaneal spurs  . Diastolic dysfunction      ejection fraction 55-65% on 2-D echo May 2006  . Empty sella syndrome [facility])     based on MRI September 2001, related to obesity, hypertension, it was determined that patient has not required treatment but will be monitored with TSH, ACTH, cortisol, testosterone, prolactin, growth hormone  . NAFLD (nonalcoholic fatty liver disease)      based on ultrasound June 2002, elevated transaminase  . Reactive airway disease      peak flow 180 in May 2005, chronic sinusitis with PND, bronchitis December 2002 and strep pneumonia April 2007,  FEC 66, MCV 60, ratio is 69, DLCO 57,  moderate restrictive and mild obstructive disease  on spirometry May 2005  . Granulomatous disease, chronic (HCC)      in right upper lobe per x-ray December 2005  . Chicken pox    Past Surgical History  Procedure Laterality Date  . Cholecystectomy  2003  . Carpal tunnel release  1992     left arm 1992  . Tonsillectomy and adenoidectomy      reports that she has quit smoking. Her smoking use included Cigarettes. She has a 1 pack-year smoking history. She has never used smokeless tobacco. She reports that she does not drink alcohol or use illicit drugs. family history includes Hypertension in her mother and sister; Lung cancer in her mother and sister. Allergies  Allergen Reactions  . Atorvastatin     REACTION: unable to tolerate. reason unknown  . Clopidogrel Bisulfate     REACTION: weak,  tired, sleepy, tightness (6/05); soreness in hip (11/05)  . Fexofenadine     REACTION: intolerance unknown  . Furosemide     REACTION: did not tolerate and preferred HCTZ  . Hydrochlorothiazide W-Triamterene     REACTION: things crawled in her head and felt funny(10/04) ; h/a (3/07)  . Lisinopril     REACTION: cough, right sided pain  . Metoprolol Tartrate     REACTION: lightheadedness, h/a cough (7/04); heart gong to stop (7/06); sluggishnes and depressed feeling (2/07); weakness/heaviness 6/07)  . Mometasone Furoate     REACTION: neck tenderness   Current Outpatient Prescriptions on File Prior to Visit    Medication Sig Dispense Refill  . albuterol (PROAIR HFA) 108 (90 BASE) MCG/ACT inhaler Inhale 2 puffs into the lungs every 4 (four) hours as needed for shortness of breath. 1 Inhaler 2  . HYDROcodone-acetaminophen (NORCO) 10-325 MG tablet TAKE 1 TABLET 3 TIMES DAILY.  0  . ibuprofen (ADVIL,MOTRIN) 400 MG tablet TAKE 1 TABLET 3 TIMES DAILY.  0  . LINZESS 145 MCG CAPS capsule TAKE 1 CAPSULE BY MOUTH EVERY MORNING 30 capsule 0  . potassium chloride (K-DUR) 10 MEQ tablet Take 1 tablet (10 mEq total) by mouth 2 (two) times daily. 90 tablet 1  . VOLTAREN 1 % GEL APPLY 2 GRAMS 3 TIMES DAILY.  0   No current facility-administered medications on file prior to visit.   Review of Systems  Constitutional: Negative for unusual diaphoresis or night sweats HENT: Negative for ringing in ear or discharge Eyes: Negative for double vision or worsening visual disturbance.  Respiratory: Negative for choking and stridor.   Gastrointestinal: Negative for vomiting or other signifcant bowel change Genitourinary: Negative for hematuria or change in urine volume.  Musculoskeletal: Negative for other MSK pain or swelling Skin: Negative for color change and worsening wound.  Neurological: Negative for tremors and numbness other than noted  Psychiatric/Behavioral: Negative for decreased concentration or agitation other than above       Objective:   Physical Exam BP 122/70 mmHg  Pulse 83  Temp(Src) 98.3 F (36.8 C) (Oral)  Ht 5' 5.5" (1.664 m)  Wt 255 lb (115.667 kg)  BMI 41.77 kg/m2  SpO2 98% VS noted, obese Constitutional: Pt appears in no significant distress HENT: Head: NCAT.  Right Ear: External ear normal.  Left Ear: External ear normal.  Eyes: . Pupils are equal, round, and reactive to light. Conjunctivae and EOM are normal Neck: Normal range of motion. Neck supple.  Cardiovascular: Normal rate and regular rhythm.   Pulmonary/Chest: Effort normal and breath sounds without rales or wheezing.  Abd:   Soft, NT, ND, + BS Neurological: Pt is alert. Not confused , motor grossly intact Skin: Skin is warm. No rash, no LE edema Psychiatric: Pt behavior is normal. No agitation.     Assessment & Plan:

## 2015-07-22 NOTE — Patient Instructions (Addendum)
Please start Aspirin 81 mg  - 1 per day (enteric coated only) - due to history of  Heart disease (if not already taking)  Please continue all other medications as before, and refills have been done if requested - for BP and sugar  Please have the pharmacy call with any other refills you may need.  Please continue your efforts at being more active, low cholesterol diet, and weight control.  Please keep your appointments with your specialists as you may have planned  Please go to the LAB in the Basement (turn left off the elevator) for the tests to be done today  You will be contacted by phone if any changes need to be made immediately.  Otherwise, you will receive a letter about your results with an explanation, but please check with MyChart first.  Please return in 6 months, or sooner if needed, to Dr Lawerance Bach

## 2015-07-22 NOTE — Assessment & Plan Note (Signed)
stable overall by history and exam, recent data reviewed with pt, and pt to continue medical treatment as before,  to f/u any worsening symptoms or concerns BP Readings from Last 3 Encounters:  07/22/15 122/70  03/22/15 134/78  02/28/15 150/90

## 2015-07-22 NOTE — Addendum Note (Signed)
Addended by: Verlan Friends on: 07/22/2015 02:32 PM   Modules accepted: Orders

## 2015-07-22 NOTE — Assessment & Plan Note (Signed)
stable overall by history and exam, recent data reviewed with pt, and pt to continue medical treatment as before,  to f/u any worsening symptoms or concerns Lab Results  Component Value Date   HGBA1C 6.3 12/21/2014

## 2015-07-22 NOTE — Telephone Encounter (Signed)
This has been resent with the correct sig.

## 2015-07-22 NOTE — Telephone Encounter (Signed)
[facility] called to clarify direction for the glipiZIDE (GLUCOTROL) 10 MG tablet. They are questioning about the 1/2 daily because direction said Take 1 tablet (10 mg total) by mouth 2 (two) times daily before a meal. Please give them a call back

## 2015-07-22 NOTE — Progress Notes (Signed)
Pre visit review using our clinic review tool, if applicable. No additional management support is needed unless otherwise documented below in the visit note. 

## 2015-07-22 NOTE — Assessment & Plan Note (Signed)
stable overall by history and exam, recent data reviewed with pt, and pt to continue medical treatment as before,  to f/u any worsening symptoms or concerns Lab Results  Component Value Date   LDLCALC 114* 09/27/2008   For f/u lab, goal ldl < 70

## 2015-08-01 DIAGNOSIS — M25562 Pain in left knee: Secondary | ICD-10-CM | POA: Diagnosis not present

## 2015-08-01 DIAGNOSIS — M545 Low back pain: Secondary | ICD-10-CM | POA: Diagnosis not present

## 2015-08-01 DIAGNOSIS — M25561 Pain in right knee: Secondary | ICD-10-CM | POA: Diagnosis not present

## 2015-08-01 DIAGNOSIS — Z79891 Long term (current) use of opiate analgesic: Secondary | ICD-10-CM | POA: Diagnosis not present

## 2015-08-01 DIAGNOSIS — G894 Chronic pain syndrome: Secondary | ICD-10-CM | POA: Diagnosis not present

## 2015-08-30 ENCOUNTER — Other Ambulatory Visit: Payer: Self-pay | Admitting: *Deleted

## 2015-08-30 DIAGNOSIS — M25561 Pain in right knee: Secondary | ICD-10-CM | POA: Diagnosis not present

## 2015-08-30 DIAGNOSIS — G894 Chronic pain syndrome: Secondary | ICD-10-CM | POA: Diagnosis not present

## 2015-08-30 DIAGNOSIS — F1120 Opioid dependence, uncomplicated: Secondary | ICD-10-CM | POA: Diagnosis not present

## 2015-08-30 DIAGNOSIS — M545 Low back pain: Secondary | ICD-10-CM | POA: Diagnosis not present

## 2015-08-30 DIAGNOSIS — M25562 Pain in left knee: Secondary | ICD-10-CM | POA: Diagnosis not present

## 2015-08-30 MED ORDER — ALBUTEROL SULFATE HFA 108 (90 BASE) MCG/ACT IN AERS: 2.0000 | INHALATION_SPRAY | RESPIRATORY_TRACT | Status: DC | PRN

## 2015-08-30 NOTE — Telephone Encounter (Signed)
Pt requesting refills on her albuterol inhaler...[name]/lmb

## 2015-09-26 DIAGNOSIS — M25562 Pain in left knee: Secondary | ICD-10-CM | POA: Diagnosis not present

## 2015-09-26 DIAGNOSIS — G894 Chronic pain syndrome: Secondary | ICD-10-CM | POA: Diagnosis not present

## 2015-09-26 DIAGNOSIS — M545 Low back pain: Secondary | ICD-10-CM | POA: Diagnosis not present

## 2015-09-26 DIAGNOSIS — Z79891 Long term (current) use of opiate analgesic: Secondary | ICD-10-CM | POA: Diagnosis not present

## 2015-09-26 DIAGNOSIS — M25561 Pain in right knee: Secondary | ICD-10-CM | POA: Diagnosis not present

## 2015-10-18 DIAGNOSIS — Z961 Presence of intraocular lens: Secondary | ICD-10-CM | POA: Diagnosis not present

## 2015-10-18 DIAGNOSIS — H18411 Arcus senilis, right eye: Secondary | ICD-10-CM | POA: Diagnosis not present

## 2015-10-18 DIAGNOSIS — H18412 Arcus senilis, left eye: Secondary | ICD-10-CM | POA: Diagnosis not present

## 2015-10-22 ENCOUNTER — Other Ambulatory Visit: Payer: Self-pay | Admitting: Internal Medicine

## 2015-10-24 DIAGNOSIS — G894 Chronic pain syndrome: Secondary | ICD-10-CM | POA: Diagnosis not present

## 2015-10-24 DIAGNOSIS — G890 Central pain syndrome: Secondary | ICD-10-CM | POA: Diagnosis not present

## 2015-10-24 DIAGNOSIS — Z79891 Long term (current) use of opiate analgesic: Secondary | ICD-10-CM | POA: Diagnosis not present

## 2015-10-24 DIAGNOSIS — G8929 Other chronic pain: Secondary | ICD-10-CM | POA: Diagnosis not present

## 2015-11-22 DIAGNOSIS — M25562 Pain in left knee: Secondary | ICD-10-CM | POA: Diagnosis not present

## 2015-11-22 DIAGNOSIS — Z79891 Long term (current) use of opiate analgesic: Secondary | ICD-10-CM | POA: Diagnosis not present

## 2015-11-22 DIAGNOSIS — G8929 Other chronic pain: Secondary | ICD-10-CM | POA: Diagnosis not present

## 2015-11-22 DIAGNOSIS — M25561 Pain in right knee: Secondary | ICD-10-CM | POA: Diagnosis not present

## 2015-11-22 DIAGNOSIS — M545 Low back pain: Secondary | ICD-10-CM | POA: Diagnosis not present

## 2015-11-22 DIAGNOSIS — G894 Chronic pain syndrome: Secondary | ICD-10-CM | POA: Diagnosis not present

## 2015-12-20 DIAGNOSIS — M25562 Pain in left knee: Secondary | ICD-10-CM | POA: Diagnosis not present

## 2015-12-20 DIAGNOSIS — M545 Low back pain: Secondary | ICD-10-CM | POA: Diagnosis not present

## 2015-12-20 DIAGNOSIS — Z79891 Long term (current) use of opiate analgesic: Secondary | ICD-10-CM | POA: Diagnosis not present

## 2015-12-20 DIAGNOSIS — M25561 Pain in right knee: Secondary | ICD-10-CM | POA: Diagnosis not present

## 2015-12-20 DIAGNOSIS — G894 Chronic pain syndrome: Secondary | ICD-10-CM | POA: Diagnosis not present

## 2015-12-27 ENCOUNTER — Other Ambulatory Visit: Payer: Self-pay | Admitting: Family

## 2016-01-14 ENCOUNTER — Ambulatory Visit: Payer: Medicare Other | Admitting: Family

## 2016-01-14 ENCOUNTER — Other Ambulatory Visit: Payer: Medicare Other

## 2016-01-14 ENCOUNTER — Encounter: Payer: Self-pay | Admitting: Family

## 2016-01-14 ENCOUNTER — Other Ambulatory Visit: Payer: Self-pay | Admitting: Family

## 2016-01-14 ENCOUNTER — Ambulatory Visit (INDEPENDENT_AMBULATORY_CARE_PROVIDER_SITE_OTHER): Payer: Medicare Other | Admitting: Family

## 2016-01-14 VITALS — BP 146/88 | HR 110 | Temp 98.5°F | Resp 18 | Wt 267.0 lb

## 2016-01-14 DIAGNOSIS — R300 Dysuria: Secondary | ICD-10-CM | POA: Diagnosis not present

## 2016-01-14 LAB — POCT URINALYSIS DIPSTICK
Bilirubin, UA: NEGATIVE
Ketones, UA: NEGATIVE
Leukocytes, UA: NEGATIVE
Nitrite, UA: NEGATIVE
PH UA: 6
Protein, UA: NEGATIVE
RBC UA: NEGATIVE
SPEC GRAV UA: 1.025
UROBILINOGEN UA: 0.2

## 2016-01-14 LAB — WET PREP, GENITAL: Trich, Wet Prep: NONE SEEN — AB

## 2016-01-14 MED ORDER — METRONIDAZOLE 500 MG PO TABS: 500.0000 mg | ORAL_TABLET | Freq: Two times a day (BID) | ORAL | 0 refills | Status: DC

## 2016-01-14 MED ORDER — FLUCONAZOLE 150 MG PO TABS: 150.0000 mg | ORAL_TABLET | Freq: Once | ORAL | 0 refills | Status: AC

## 2016-01-14 NOTE — Progress Notes (Signed)
Pre visit review using our clinic review tool, if applicable. No additional management support is needed unless otherwise documented below in the visit note. 

## 2016-01-14 NOTE — Patient Instructions (Signed)
Thank you for choosing [facility].  Summary/Instructions:   We will call with the results of your lab and urine work with additional treatments.   If your symptoms worsen or fail to improve, please contact our office for further instruction, or in case of emergency go directly to the emergency room at the closest medical facility.    Urinary Tract Infection Urinary tract infections (UTIs) can develop anywhere along your urinary tract. Your urinary tract is your body's drainage system for removing wastes and extra water. Your urinary tract includes two kidneys, two ureters, a bladder, and a urethra. Your kidneys are a pair of bean-shaped organs. Each kidney is about the size of your fist. They are located below your ribs, one on each side of your spine. CAUSES Infections are caused by microbes, which are microscopic organisms, including fungi, viruses, and bacteria. These organisms are so small that they can only be seen through a microscope. Bacteria are the microbes that most commonly cause UTIs. SYMPTOMS  Symptoms of UTIs may vary by age and gender of the patient and by the location of the infection. Symptoms in young women typically include a frequent and intense urge to urinate and a painful, burning feeling in the bladder or urethra during urination. Older women and men are more likely to be tired, shaky, and weak and have muscle aches and abdominal pain. A fever may mean the infection is in your kidneys. Other symptoms of a kidney infection include pain in your back or sides below the ribs, nausea, and vomiting. DIAGNOSIS To diagnose a UTI, your caregiver will ask you about your symptoms. Your caregiver will also ask you to provide a urine sample. The urine sample will be tested for bacteria and white blood cells. White blood cells are made by your body to help fight infection. TREATMENT  Typically, UTIs can be treated with medication. Because most UTIs are caused by a bacterial  infection, they usually can be treated with the use of antibiotics. The choice of antibiotic and length of treatment depend on your symptoms and the type of bacteria causing your infection. HOME CARE INSTRUCTIONS  If you were prescribed antibiotics, take them exactly as your caregiver instructs you. Finish the medication even if you feel better after you have only taken some of the medication.  Drink enough water and fluids to keep your urine clear or pale yellow.  Avoid caffeine, tea, and carbonated beverages. They tend to irritate your bladder.  Empty your bladder often. Avoid holding urine for long periods of time.  Empty your bladder before and after sexual intercourse.  After a bowel movement, women should cleanse from front to back. Use each tissue only once. SEEK MEDICAL CARE IF:   You have back pain.  You develop a fever.  Your symptoms do not begin to resolve within 3 days. SEEK IMMEDIATE MEDICAL CARE IF:   You have severe back pain or lower abdominal pain.  You develop chills.  You have nausea or vomiting.  You have continued burning or discomfort with urination. MAKE SURE YOU:   Understand these instructions.  Will watch your condition.  Will get help right away if you are not doing well or get worse.   This information is not intended to replace advice given to you by your health care provider. Make sure you discuss any questions you have with your health care provider.   Document Released: 03/04/2005 Document Revised: 02/13/2015 Document Reviewed: 07/03/2011 Elsevier Interactive Patient Education [employer]2016 Elsevier Inc.

## 2016-01-14 NOTE — Assessment & Plan Note (Signed)
Multiple UA symptoms with in office UA negative for leukocytes, nitrites and hematuria. Urine will be sent for culture. Wet prep obtained with concern for candidiasis or possible BV. Treatment pending lab work.

## 2016-01-14 NOTE — Progress Notes (Signed)
Subjective:    Patient ID: Lindsay PonderSylvia Troupe, female    DOB: [birth year], 65 y.o.   MRN: [id number]  Chief Complaint  Patient presents with  . Urinary Tract Infection    c/o burning, itching, frequency, abdominal pain. denies odor or discoloration    HPI:  Lindsay Hardy is a 65 y.o. female who  has a past medical history of Asthma; CAD (coronary artery disease) (11/2003); Chicken pox; CVA (cerebrovascular accident) (HCC) (2001); Diabetes mellitus; Diastolic dysfunction; Empty sella syndrome (HCC); GERD (gastroesophageal reflux disease); Granulomatous disease, chronic (HCC); Hyperlipidemia; Hypertension; NAFLD (nonalcoholic fatty liver disease); OA (osteoarthritis); and Reactive airway disease. and presents today for an acute office visit.                                                                                                                                                     This is a new problem. Associated symptom urinary frequency, urinary urgency, and dysuria has been going on for about 3-4 days. Denies any fevers. No vaginal discharge, odors, or discoloration. Has had some lower abdominal pain. Denies any modifying factors or attempted treatments.  Allergies  Allergen Reactions  . Atorvastatin     REACTION: unable to tolerate. reason unknown  . Clopidogrel Bisulfate     REACTION: weak, tired, sleepy, tightness (6/05); soreness in hip (11/05)  . Fexofenadine     REACTION: intolerance unknown  . Furosemide     REACTION: did not tolerate and preferred HCTZ  . Hydrochlorothiazide W-Triamterene     REACTION: things crawled in her head and felt funny(10/04) ; h/a (3/07)  . Lisinopril     REACTION: cough, right sided pain  . Metoprolol Tartrate     REACTION: lightheadedness, h/a cough (7/04); heart gong to stop (7/06); sluggishnes and depressed feeling (2/07); weakness/heaviness 6/07)  . Mometasone Furoate     REACTION: neck tenderness     Current Outpatient Prescriptions on  File Prior to Visit  Medication Sig Dispense Refill  . albuterol (PROAIR HFA) 108 (90 Base) MCG/ACT inhaler Inhale 2 puffs into the lungs every 4 (four) hours as needed for shortness of breath. 1 Inhaler 2  . amLODipine (NORVASC) 10 MG tablet Take 1 tablet (10 mg total) by mouth daily. 90 tablet 1  . aspirin EC 81 MG tablet Take 1 tablet (81 mg total) by mouth daily. 90 tablet 11  . glipiZIDE (GLUCOTROL) 10 MG tablet TAKE 1 TABLET (10 MG TOTAL) BY MOUTH 2 (TWO) TIMES DAILY BEFORE A MEAL. 90 tablet 1  . HYDROcodone-acetaminophen (NORCO) 10-325 MG tablet TAKE 1 TABLET 3 TIMES DAILY.  0  . ibuprofen (ADVIL,MOTRIN) 400 MG tablet TAKE 1 TABLET 3 TIMES DAILY.  0  . LINZESS 145 MCG CAPS capsule TAKE 1 CAPSULE BY MOUTH EVERY MORNING 30 capsule 0  . metFORMIN (GLUCOPHAGE-XR) 500 MG 24 hr  tablet Take 1 tablet (500 mg total) by mouth daily with breakfast. 90 tablet 3  . potassium chloride (MICRO-K) 10 MEQ CR capsule TAKE ONE CAPSULE BY MOUTH TWICE A DAY 90 capsule 0  . rosuvastatin (CRESTOR) 10 MG tablet Take 1 tablet (10 mg total) by mouth daily. 90 tablet 3  . VOLTAREN 1 % GEL APPLY 2 GRAMS 3 TIMES DAILY.  0   No current facility-administered medications on file prior to visit.     Review of Systems  Constitutional: Negative for chills and fever.  Genitourinary: Positive for dysuria, frequency and urgency. Negative for flank pain, hematuria, vaginal bleeding and vaginal discharge.      Objective:    BP (!) 146/88 (BP Location: Left Arm, Patient Position: Sitting, Cuff Size: Large)   Pulse (!) 110   Temp 98.5 F (36.9 C) (Oral)   Resp 18   Wt 267 lb (121.1 kg)   SpO2 95%   BMI 43.76 kg/m  Nursing note and vital signs reviewed.  Physical Exam  Constitutional: She is oriented to person, place, and time. She appears well-developed and well-nourished. No distress.  Cardiovascular: Normal rate, regular rhythm, normal heart sounds and intact distal pulses.   Pulmonary/Chest: Effort normal and  breath sounds normal.  Abdominal: Normal appearance and bowel sounds are normal. She exhibits no mass. There is tenderness in the suprapubic area. There is no CVA tenderness.  Neurological: She is alert and oriented to person, place, and time.  Skin: Skin is warm and dry.  Psychiatric: She has a normal mood and affect. Her behavior is normal. Judgment and thought content normal.       Assessment & Plan:   Problem List Items Addressed This Visit      Other   Dysuria - Primary    Multiple UA symptoms with in office UA negative for leukocytes, nitrites and hematuria. Urine will be sent for culture. Wet prep obtained with concern for candidiasis or possible BV. Treatment pending lab work.       Relevant Orders   POCT urinalysis dipstick (Completed)   Urine Culture   Wet prep, genital    Other Visit Diagnoses   None.      I am having Lindsay Hardy maintain her LINZESS, VOLTAREN, HYDROcodone-acetaminophen, ibuprofen, amLODipine, aspirin EC, rosuvastatin, metFORMIN, albuterol, glipiZIDE, and potassium chloride.   Follow-up: Return if symptoms worsen or fail to improve.  Jeanine Luz, FNP

## 2016-01-16 LAB — URINE CULTURE: Organism ID, Bacteria: 10000

## 2016-01-22 DIAGNOSIS — M545 Low back pain: Secondary | ICD-10-CM | POA: Diagnosis not present

## 2016-01-22 DIAGNOSIS — Z79891 Long term (current) use of opiate analgesic: Secondary | ICD-10-CM | POA: Diagnosis not present

## 2016-01-22 DIAGNOSIS — M25561 Pain in right knee: Secondary | ICD-10-CM | POA: Diagnosis not present

## 2016-01-22 DIAGNOSIS — G894 Chronic pain syndrome: Secondary | ICD-10-CM | POA: Diagnosis not present

## 2016-01-22 DIAGNOSIS — M25562 Pain in left knee: Secondary | ICD-10-CM | POA: Diagnosis not present

## 2016-02-05 ENCOUNTER — Encounter: Payer: Self-pay | Admitting: Internal Medicine

## 2016-02-05 ENCOUNTER — Ambulatory Visit (INDEPENDENT_AMBULATORY_CARE_PROVIDER_SITE_OTHER): Payer: Medicare Other | Admitting: Internal Medicine

## 2016-02-05 ENCOUNTER — Other Ambulatory Visit (INDEPENDENT_AMBULATORY_CARE_PROVIDER_SITE_OTHER): Payer: Medicare Other

## 2016-02-05 VITALS — BP 148/84 | HR 81 | Temp 98.2°F | Resp 16 | Wt 270.0 lb

## 2016-02-05 DIAGNOSIS — E1143 Type 2 diabetes mellitus with diabetic autonomic (poly)neuropathy: Secondary | ICD-10-CM

## 2016-02-05 DIAGNOSIS — M545 Low back pain: Secondary | ICD-10-CM | POA: Diagnosis not present

## 2016-02-05 DIAGNOSIS — I10 Essential (primary) hypertension: Secondary | ICD-10-CM

## 2016-02-05 DIAGNOSIS — E1142 Type 2 diabetes mellitus with diabetic polyneuropathy: Secondary | ICD-10-CM

## 2016-02-05 DIAGNOSIS — E785 Hyperlipidemia, unspecified: Secondary | ICD-10-CM

## 2016-02-05 LAB — CBC WITH DIFFERENTIAL/PLATELET
BASOS ABS: 0 10*3/uL (ref 0.0–0.1)
BASOS PCT: 0.4 % (ref 0.0–3.0)
EOS ABS: 0.1 10*3/uL (ref 0.0–0.7)
Eosinophils Relative: 1.8 % (ref 0.0–5.0)
HEMATOCRIT: 40.3 % (ref 36.0–46.0)
Hemoglobin: 13.2 g/dL (ref 12.0–15.0)
LYMPHS ABS: 1.6 10*3/uL (ref 0.7–4.0)
LYMPHS PCT: 24.8 % (ref 12.0–46.0)
MCHC: 32.8 g/dL (ref 30.0–36.0)
MCV: 77.9 fl — ABNORMAL LOW (ref 78.0–100.0)
MONO ABS: 0.7 10*3/uL (ref 0.1–1.0)
Monocytes Relative: 10.5 % (ref 3.0–12.0)
NEUTROS ABS: 4 10*3/uL (ref 1.4–7.7)
NEUTROS PCT: 62.5 % (ref 43.0–77.0)
PLATELETS: 273 10*3/uL (ref 150.0–400.0)
RBC: 5.18 Mil/uL — ABNORMAL HIGH (ref 3.87–5.11)
RDW: 15.7 % — AB (ref 11.5–15.5)
WBC: 6.3 10*3/uL (ref 4.0–10.5)

## 2016-02-05 LAB — TSH: TSH: 2.16 u[IU]/mL (ref 0.35–4.50)

## 2016-02-05 LAB — COMPREHENSIVE METABOLIC PANEL
ALT: 24 U/L (ref 0–35)
AST: 16 U/L (ref 0–37)
Albumin: 3.9 g/dL (ref 3.5–5.2)
Alkaline Phosphatase: 88 U/L (ref 39–117)
BILIRUBIN TOTAL: 0.3 mg/dL (ref 0.2–1.2)
BUN: 13 mg/dL (ref 6–23)
CALCIUM: 8.9 mg/dL (ref 8.4–10.5)
CHLORIDE: 101 meq/L (ref 96–112)
CO2: 31 meq/L (ref 19–32)
CREATININE: 0.79 mg/dL (ref 0.40–1.20)
GFR: 93.45 mL/min (ref >60.00)
GLUCOSE: 178 mg/dL — AB (ref 70–99)
Potassium: 3.8 mEq/L (ref 3.5–5.1)
SODIUM: 136 meq/L (ref 135–145)
Total Protein: 7.6 g/dL (ref 6.0–8.3)

## 2016-02-05 LAB — HEMOGLOBIN A1C: HEMOGLOBIN A1C: 9.9 % — AB (ref 4.6–6.5)

## 2016-02-05 LAB — MICROALBUMIN / CREATININE URINE RATIO
Creatinine,U: 99.9 mg/dL
Microalb Creat Ratio: 0.7 mg/g (ref 0.0–30.0)
Microalb, Ur: 0.7 mg/dL (ref 0.0–1.9)

## 2016-02-05 MED ORDER — FLUTICASONE PROPIONATE 50 MCG/ACT NA SUSP: 2.0000 | Freq: Every day | NASAL | 6 refills | Status: DC

## 2016-02-05 MED ORDER — AMLODIPINE BESYLATE 10 MG PO TABS: 10.0000 mg | ORAL_TABLET | Freq: Every day | ORAL | 1 refills | Status: DC

## 2016-02-05 MED ORDER — LINACLOTIDE 145 MCG PO CAPS: 145.0000 ug | ORAL_CAPSULE | Freq: Every morning | ORAL | 5 refills | Status: DC

## 2016-02-05 MED ORDER — GABAPENTIN 300 MG PO CAPS: 300.0000 mg | ORAL_CAPSULE | Freq: Two times a day (BID) | ORAL | 2 refills | Status: DC

## 2016-02-05 NOTE — Patient Instructions (Addendum)
  Test(s) ordered today. Your results will be released to MyChart (or called to you) after review, usually within 72hours after test completion. If any changes need to be made, you will be notified at that same time.   Medications reviewed and updated.  Changes include starting gabapentin.    Your prescription(s) have been submitted to your pharmacy. Please take as directed and contact our office if you believe you are having problem(s) with the medication(s).  Please followup in 6 weeks

## 2016-02-05 NOTE — Assessment & Plan Note (Signed)
BP Readings from Last 3 Encounters:  02/05/16 (!) 148/84  01/14/16 (!) 146/88  07/22/15 122/70   BP elevated here today, but she states it is usually controlled Will recheck at her next visit Continue current medications

## 2016-02-05 NOTE — Progress Notes (Signed)
Pre visit review using our clinic review tool, if applicable. No additional management support is needed unless otherwise documented below in the visit note. 

## 2016-02-05 NOTE — Assessment & Plan Note (Addendum)
Deferred nutrition Wants to a 1200 calorie diet Not exercising - will start walking Check a1c Continue current medications Will start gabapentin for neuropathy - follow up in 6 weeks

## 2016-02-05 NOTE — Assessment & Plan Note (Signed)
Pain medication prescribed by pain management Taking advil as needed Taking norco

## 2016-02-05 NOTE — Assessment & Plan Note (Signed)
Start gabapentin 300 mg twice daily Follow up in 6 weeks

## 2016-02-05 NOTE — Assessment & Plan Note (Signed)
Stopped crestor - did not like how she felt on it Check lipid panel at next visit Work on increasing exercise Work on weight loss

## 2016-02-05 NOTE — Progress Notes (Signed)
Subjective:    Patient ID: Lindsay Hardy, female    DOB: 12-14-1950, 65 y.o.   MRN: [id number]  HPI The patient is here for follow up.  Diabetes: She is taking her medication daily as prescribed. She is not compliant with a diabetic diet. She is not exercising regularly. She monitors her sugars sometimes.  She checks her feet daily and denies foot lesions. She is up-to-date with an ophthalmology examination. She does want some diabetic shoes.  She denies a history of calluses and ulcers.  [facility]    Hypertension: She is taking her medication daily. She is compliant with a low sodium diet.  She is not exercising regularly.  She does not monitor her blood pressure at home.    Neuropathy from diabetes:  She has pain in her feet and mid way up her legs.  She wants to try gabapentin - she has never taken it.  She has decreased sensation in her feet and legs.   Hyperlipidemia: She is not taking her medication daily. She did not feel right on the medication and stopped it.  She is compliant with a low fat/cholesterol diet. She is not exercising regularly.   Constipation:  She takes linzess as needed only and it heps.   Medications and allergies reviewed with patient and updated if appropriate.  Patient Active Problem List   Diagnosis Date Noted  . Dysuria 01/14/2016  . Low back pain 05/09/2014  . 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 Hyperlipidemia 06/17/2006  . Essential hypertension 06/17/2006  . CORONARY ARTERY DISEASE 06/17/2006  . DIASTOLIC DYSFUNCTION 06/17/2006  . DISEASE, CEREBROVASCULAR NEC 06/17/2006  . GERD 06/17/2006  . Osteoarthritis of left knee 06/17/2006    Current Outpatient Prescriptions on File Prior to Visit  Medication Sig Dispense Refill  . albuterol (PROAIR HFA) 108 (90 Base) MCG/ACT inhaler Inhale 2 puffs into the lungs every 4 (four) hours as needed for shortness of breath. 1 Inhaler 2  .  amLODipine (NORVASC) 10 MG tablet Take 1 tablet (10 mg total) by mouth daily. 90 tablet 1  . aspirin EC 81 MG tablet Take 1 tablet (81 mg total) by mouth daily. 90 tablet 11  . glipiZIDE (GLUCOTROL) 10 MG tablet TAKE 1 TABLET (10 MG TOTAL) BY MOUTH 2 (TWO) TIMES DAILY BEFORE A MEAL. 90 tablet 1  . HYDROcodone-acetaminophen (NORCO) 10-325 MG tablet TAKE 1 TABLET 3 TIMES DAILY.  0  . ibuprofen (ADVIL,MOTRIN) 400 MG tablet TAKE 1 TABLET 3 TIMES DAILY.  0  . LINZESS 145 MCG CAPS capsule TAKE 1 CAPSULE BY MOUTH EVERY MORNING 30 capsule 0  . metFORMIN (GLUCOPHAGE-XR) 500 MG 24 hr tablet Take 1 tablet (500 mg total) by mouth daily with breakfast. 90 tablet 3  . potassium chloride (MICRO-K) 10 MEQ CR capsule TAKE ONE CAPSULE BY MOUTH TWICE A DAY 90 capsule 0  . rosuvastatin (CRESTOR) 10 MG tablet Take 1 tablet (10 mg total) by mouth daily. 90 tablet 3  . VOLTAREN 1 % GEL APPLY 2 GRAMS 3 TIMES DAILY.  0   No current facility-administered medications on file prior to visit.     Past Medical History:  Diagnosis Date  . Asthma   . CAD (coronary artery disease) 11/2003    RCA 60% stenosis, LAD diffuse  . Chicken pox   . CVA (cerebrovascular accident) [facility](HCC) 2001    right frontoparietal cortical CVA - MRI September 2001  . Diabetes  mellitus   . Diastolic dysfunction     ejection fraction 55-65% on 2-D echo May 2006  . Empty sella syndrome [facility])     based on MRI September 2001, related to obesity, hypertension, it was determined that patient has not required treatment but will be monitored with TSH, ACTH, cortisol, testosterone, prolactin, growth hormone  . GERD (gastroesophageal reflux disease)     EGD June 2004 -  followed by Dr. Leone Payor  . Granulomatous disease, chronic (HCC)     in right upper lobe per x-ray December 2005  . Hyperlipidemia   . Hypertension   . NAFLD (nonalcoholic fatty liver disease)     based on ultrasound June 2002, elevated transaminase  . OA (osteoarthritis)     DJP  coracoclavicular ligament, left patella osteophytes June 2002, L4-S1 spondylosis, degenerative disc disease with bulge. I in October 2001, the C2-C4 spondylosis without stenosis and with spurs based on x-ray July 2005; diffuse idiopathic skeletal hyperostosis with bilateral hip lumbar degenerative changes   on x-ray February 2005, bilateral plantar calcaneal spurs  . Reactive airway disease     peak flow 180 in May 2005, chronic sinusitis with PND, bronchitis December 2002 and strep pneumonia April 2007,  FEC 66, MCV 60, ratio is 69, DLCO 57,  moderate restrictive and mild obstructive disease  on spirometry May 2005    Past Surgical History:  Procedure Laterality Date  . CARPAL TUNNEL RELEASE  1992    left arm 1992  . CHOLECYSTECTOMY  2003  . TONSILLECTOMY AND ADENOIDECTOMY      Social History   Social History  . Marital status: Single    Spouse name: N/A  . Number of children: 1  . Years of education: 60   Occupational History  .  fromer nursing assistant    Social History Main Topics  . Smoking status: Former Smoker    Packs/day: 0.25    Years: 4.00    Types: Cigarettes  . Smokeless tobacco: Never Used     Comment: smoked in 20's   . Alcohol use No  . Drug use: No  . Sexual activity: Not Currently    Birth control/ protection: Post-menopausal   Other Topics Concern  . Not on file   Social History Narrative   Born and raised in [place], [place]. Currently lives in a private residence with her nephew and his wife. Fun: Church activities, spending time with family   Denies any religious beliefs that would effect health care.     Family History  Problem Relation Age of Onset  . Lung cancer Mother   . Hypertension Mother   . Lung cancer Sister   . Hypertension Sister     Review of Systems  Constitutional: Negative for chills and fever.  Respiratory: Positive for shortness of breath (occ) and wheezing (sometimes - inhaler prn). Negative for cough.   Cardiovascular:  Positive for palpitations (occ). Negative for chest pain and leg swelling.  Gastrointestinal: Positive for constipation.  Endocrine: Negative for polydipsia and polyuria.  Neurological: Positive for headaches (chronic - no change). Negative for dizziness and light-headedness.       Objective:   Vitals:   02/05/16 1611  BP: (!) 148/84  Pulse: 81  Resp: 16  Temp: 98.2 F (36.8 C)   Filed Weights   02/05/16 1611  Weight: 270 lb (122.5 kg)   Body mass index is 44.25 kg/m.   Physical Exam    Constitutional: Appears well-developed and well-nourished. No distress.  HENT:  Head: Normocephalic and atraumatic.  Neck: Neck supple. No tracheal deviation present. No thyromegaly present.  Cardiovascular: Normal rate, regular rhythm and normal heart sounds.   No murmur heard. No carotid bruit  Pulmonary/Chest: Effort normal and breath sounds normal. No respiratory distress. No has no wheezes. No rales.  Musculoskeletal: No edema.  Foot exam performed:   No lesion or deformities.  Normal DP pulses.  Decreased sensation feet < lower legs b/l.  Dry skin.  No breaks in skin.  Lymphadenopathy: No cervical adenopathy.  Skin: Skin is warm and dry. Not diaphoretic.  Psychiatric: Normal mood and affect. Behavior is normal.    Assessment & Plan:    See Problem List for Assessment and Plan of chronic medical problems.

## 2016-02-11 ENCOUNTER — Other Ambulatory Visit: Payer: Self-pay | Admitting: Internal Medicine

## 2016-02-19 DIAGNOSIS — G8929 Other chronic pain: Secondary | ICD-10-CM | POA: Diagnosis not present

## 2016-02-19 DIAGNOSIS — G890 Central pain syndrome: Secondary | ICD-10-CM | POA: Diagnosis not present

## 2016-02-19 DIAGNOSIS — M545 Low back pain: Secondary | ICD-10-CM | POA: Diagnosis not present

## 2016-02-19 DIAGNOSIS — Z79891 Long term (current) use of opiate analgesic: Secondary | ICD-10-CM | POA: Diagnosis not present

## 2016-02-19 DIAGNOSIS — G894 Chronic pain syndrome: Secondary | ICD-10-CM | POA: Diagnosis not present

## 2016-03-20 ENCOUNTER — Telehealth: Payer: Self-pay | Admitting: Internal Medicine

## 2016-03-20 DIAGNOSIS — Z79891 Long term (current) use of opiate analgesic: Secondary | ICD-10-CM | POA: Diagnosis not present

## 2016-03-20 DIAGNOSIS — M25561 Pain in right knee: Secondary | ICD-10-CM | POA: Diagnosis not present

## 2016-03-20 DIAGNOSIS — M25562 Pain in left knee: Secondary | ICD-10-CM | POA: Diagnosis not present

## 2016-03-20 DIAGNOSIS — G894 Chronic pain syndrome: Secondary | ICD-10-CM | POA: Diagnosis not present

## 2016-03-20 DIAGNOSIS — M545 Low back pain: Secondary | ICD-10-CM | POA: Diagnosis not present

## 2016-03-20 DIAGNOSIS — G8929 Other chronic pain: Secondary | ICD-10-CM

## 2016-03-20 NOTE — Telephone Encounter (Signed)
Patient is requesting to be referred to a different pain management.  Patient currently goes to [name].

## 2016-03-20 NOTE — Telephone Encounter (Signed)
Referral ordered

## 2016-03-20 NOTE — Telephone Encounter (Signed)
Please advise 

## 2016-03-25 ENCOUNTER — Ambulatory Visit (INDEPENDENT_AMBULATORY_CARE_PROVIDER_SITE_OTHER): Payer: Medicare Other | Admitting: Internal Medicine

## 2016-03-25 ENCOUNTER — Encounter: Payer: Self-pay | Admitting: Internal Medicine

## 2016-03-25 ENCOUNTER — Other Ambulatory Visit (INDEPENDENT_AMBULATORY_CARE_PROVIDER_SITE_OTHER): Payer: Medicare Other

## 2016-03-25 VITALS — BP 136/82 | HR 103 | Temp 97.9°F | Resp 18 | Wt 275.0 lb

## 2016-03-25 DIAGNOSIS — I10 Essential (primary) hypertension: Secondary | ICD-10-CM | POA: Diagnosis not present

## 2016-03-25 DIAGNOSIS — E1143 Type 2 diabetes mellitus with diabetic autonomic (poly)neuropathy: Secondary | ICD-10-CM

## 2016-03-25 DIAGNOSIS — E785 Hyperlipidemia, unspecified: Secondary | ICD-10-CM

## 2016-03-25 DIAGNOSIS — E1142 Type 2 diabetes mellitus with diabetic polyneuropathy: Secondary | ICD-10-CM | POA: Diagnosis not present

## 2016-03-25 DIAGNOSIS — R300 Dysuria: Secondary | ICD-10-CM | POA: Diagnosis not present

## 2016-03-25 LAB — COMPREHENSIVE METABOLIC PANEL
ALBUMIN: 4.1 g/dL (ref 3.5–5.2)
ALT: 23 U/L (ref 0–35)
AST: 14 U/L (ref 0–37)
Alkaline Phosphatase: 107 U/L (ref 39–117)
BILIRUBIN TOTAL: 0.5 mg/dL (ref 0.2–1.2)
BUN: 14 mg/dL (ref 6–23)
CALCIUM: 9.1 mg/dL (ref 8.4–10.5)
CO2: 30 mEq/L (ref 19–32)
CREATININE: 0.74 mg/dL (ref 0.40–1.20)
Chloride: 98 mEq/L (ref 96–112)
GFR: 100.73 mL/min (ref >60.00)
Glucose, Bld: 283 mg/dL — ABNORMAL HIGH (ref 70–99)
Potassium: 3.9 mEq/L (ref 3.5–5.1)
Sodium: 134 mEq/L — ABNORMAL LOW (ref 135–145)
TOTAL PROTEIN: 8 g/dL (ref 6.0–8.3)

## 2016-03-25 LAB — LIPID PANEL
CHOL/HDL RATIO: 4
CHOLESTEROL: 185 mg/dL (ref 0–200)
HDL: 51 mg/dL (ref >39.00)
LDL Cholesterol: 118 mg/dL — ABNORMAL HIGH (ref 0–99)
NonHDL: 133.86
Triglycerides: 80 mg/dL (ref 0.0–149.0)
VLDL: 16 mg/dL (ref 0.0–40.0)

## 2016-03-25 LAB — POCT URINALYSIS DIPSTICK
Bilirubin, UA: NEGATIVE
Ketones, UA: NEGATIVE
LEUKOCYTES UA: NEGATIVE
Nitrite, UA: NEGATIVE
Protein, UA: NEGATIVE
RBC UA: NEGATIVE
Spec Grav, UA: 1.025
Urobilinogen, UA: 0.2
pH, UA: 6.5

## 2016-03-25 LAB — CBC WITH DIFFERENTIAL/PLATELET
BASOS ABS: 0 10*3/uL (ref 0.0–0.1)
Basophils Relative: 0.3 % (ref 0.0–3.0)
Eosinophils Absolute: 0.1 10*3/uL (ref 0.0–0.7)
Eosinophils Relative: 1.3 % (ref 0.0–5.0)
HEMATOCRIT: 39.9 % (ref 36.0–46.0)
Hemoglobin: 13.3 g/dL (ref 12.0–15.0)
LYMPHS PCT: 24.7 % (ref 12.0–46.0)
Lymphs Abs: 1.4 10*3/uL (ref 0.7–4.0)
MCHC: 33.4 g/dL (ref 30.0–36.0)
MCV: 77.2 fl — AB (ref 78.0–100.0)
MONOS PCT: 9.3 % (ref 3.0–12.0)
Monocytes Absolute: 0.5 10*3/uL (ref 0.1–1.0)
Neutro Abs: 3.7 10*3/uL (ref 1.4–7.7)
Neutrophils Relative %: 64.4 % (ref 43.0–77.0)
Platelets: 261 10*3/uL (ref 150.0–400.0)
RBC: 5.17 Mil/uL — AB (ref 3.87–5.11)
RDW: 15.4 % (ref 11.5–15.5)
WBC: 5.8 10*3/uL (ref 4.0–10.5)

## 2016-03-25 LAB — HEMOGLOBIN A1C: Hgb A1c MFr Bld: 10.6 % — ABNORMAL HIGH (ref 4.6–6.5)

## 2016-03-25 MED ORDER — AMLODIPINE BESYLATE 10 MG PO TABS: 10.0000 mg | ORAL_TABLET | Freq: Every day | ORAL | 1 refills | Status: DC

## 2016-03-25 MED ORDER — GLIPIZIDE 10 MG PO TABS: 10.0000 mg | ORAL_TABLET | Freq: Two times a day (BID) | ORAL | 1 refills | Status: DC

## 2016-03-25 MED ORDER — SITAGLIPTIN PHOSPHATE 100 MG PO TABS: 100.0000 mg | ORAL_TABLET | Freq: Every day | ORAL | 1 refills | Status: DC

## 2016-03-25 MED ORDER — METFORMIN HCL ER 500 MG PO TB24: 1000.0000 mg | ORAL_TABLET | Freq: Every day | ORAL | 3 refills | Status: DC

## 2016-03-25 MED ORDER — FLUCONAZOLE 150 MG PO TABS: 150.0000 mg | ORAL_TABLET | Freq: Every day | ORAL | 0 refills | Status: DC

## 2016-03-25 MED ORDER — FLUTICASONE PROPIONATE 50 MCG/ACT NA SUSP: 2.0000 | Freq: Every day | NASAL | 6 refills | Status: DC

## 2016-03-25 NOTE — Assessment & Plan Note (Addendum)
Took gabapentin twice daily but she did not think it helped and stopped the medication She is having nerve pain She will retry it again -- advised her if it does not work it may mean we need to increase the dose and if she tolerates it not to stop it Stressed better blood sugar control

## 2016-03-25 NOTE — Assessment & Plan Note (Signed)
BP Readings from Last 3 Encounters:  03/25/16 136/82  02/05/16 (!) 148/84  01/14/16 (!) 146/88   BP controlled today Continue current medication at current dose cmp

## 2016-03-25 NOTE — Assessment & Plan Note (Signed)
Urinalysis negative for infection Likely yeast infection, less likely persistent bacterial vaginosis Diflucan 150 mg daily for 2 days Stressed better sugar control She does need to follow-up with her gynecologist and will schedule an appointment She will call for symptoms do not improve

## 2016-03-25 NOTE — Patient Instructions (Addendum)
  Test(s) ordered today. Your results will be released to MyChart (or called to you) after review, usually within 72hours after test completion. If any changes need to be made, you will be notified at that same time.  All other Health Maintenance issues reviewed.   All recommended immunizations and age-appropriate screenings are up-to-date or discussed.  No immunizations administered today.   Medications reviewed and updated.  Changes include taking fluconazole for two days for a yeast infection.  We will also start januvia 100 mg daily.  We will increased metformin 500mg  twice daily or 1000mg  once daily.     Your prescription(s) have been submitted to your pharmacy. Please take as directed and contact our office if you believe you are having problem(s) with the medication(s).   Please followup in 3 months

## 2016-03-25 NOTE — Assessment & Plan Note (Signed)
Sugars poorly at her last visit and she states she has not been compliant with a diabetic diet and is not exercising She feels most likely the sugars will be higher Check A1c today We both feel she needs increased medication Continue glipizide 10 mg twice daily Increase metformin to 1000 mg daily Start Januvia 100 mg daily-discussed with her if this is too expensive we can try a different medication Stressed better diet, decreased portions and regular exercise

## 2016-03-25 NOTE — Assessment & Plan Note (Signed)
Check lipid Wants to avoid statins - tried two different one and did not tolerate them.

## 2016-03-25 NOTE — Progress Notes (Signed)
Subjective:    Patient ID: Lindsay Hardy, female    DOB: 05-22-1951, 65 y.o.   MRN: [id number]  HPI The patient is here for follow up.  Diabetes with peripheral neuropathy: She is taking her medication daily as prescribed for her diabetes and take gabapentin for the neuropathy. She is not compliant with a diabetic diet. She is not exercising regularly. She has not been monitoring her sugars at home. She checks her feet daily and denies foot lesions. She is up-to-date with an ophthalmology examination.  She does not feel the gabapentin helped and stopped taking it.  She may try it again.    Hypertension: She is taking her medication daily. She is not compliant with a low sodium diet.  She denies chest pain, palpitations, shortness of breath and regular headaches. She is not exercising regularly.  She does not monitor her blood pressure at home.    Hyperlipidemia: She is not taking any medication at this time.  She was on crestor but did not like how it made her feel.  She has also not tolerated lipitor. She is compliant with a low fat/cholesterol diet. She is exercising regularly.    Vaginal itch:  She saw Tammy Sours and she had a wet prep done and there was some yeast and clue cells.  She was treated with flagyl and diflucan and it helped, but her symptoms have recurred. She has some dysuria, But feels it is more when urine comes out side instead of when she actually urinates.  She has a lot of vaginal and vulvar itching.   She did try cortisone cream and it did not help.  Medications and allergies reviewed with patient and updated if appropriate.  Patient Active Problem List   Diagnosis Date Noted  . Diabetic peripheral neuropathy (HCC) 02/05/2016  . Low back pain 05/09/2014  . 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 Hyperlipidemia 06/17/2006  . Essential hypertension 06/17/2006  . CORONARY ARTERY DISEASE 06/17/2006  . DIASTOLIC DYSFUNCTION  06/17/2006  . DISEASE, CEREBROVASCULAR NEC 06/17/2006  . Osteoarthritis of left knee 06/17/2006    Current Outpatient Prescriptions on File Prior to Visit  Medication Sig Dispense Refill  . albuterol (PROAIR HFA) 108 (90 Base) MCG/ACT inhaler Inhale 2 puffs into the lungs every 4 (four) hours as needed for shortness of breath. 1 Inhaler 2  . amLODipine (NORVASC) 10 MG tablet Take 1 tablet (10 mg total) by mouth daily. 90 tablet 1  . aspirin EC 81 MG tablet Take 1 tablet (81 mg total) by mouth daily. 90 tablet 11  . fluticasone (FLONASE) 50 MCG/ACT nasal spray Place 2 sprays into both nostrils daily. 16 g 6  . gabapentin (NEURONTIN) 300 MG capsule Take 1 capsule (300 mg total) by mouth 2 (two) times daily. 60 capsule 2  . glipiZIDE (GLUCOTROL) 10 MG tablet TAKE 1 TABLET (10 MG TOTAL) BY MOUTH 2 (TWO) TIMES DAILY BEFORE A MEAL. 90 tablet 1  . HYDROcodone-acetaminophen (NORCO) 10-325 MG tablet Take 1 tablet by mouth every 6 (six) hours.  0  . ibuprofen (ADVIL,MOTRIN) 400 MG tablet TAKE 1 TABLET 3 TIMES DAILY.  0  . linaclotide (LINZESS) 145 MCG CAPS capsule Take 1 capsule (145 mcg total) by mouth every morning. 30 capsule 5  . metFORMIN (GLUCOPHAGE-XR) 500 MG 24 hr tablet Take 1 tablet (500 mg total) by mouth daily with breakfast. 90 tablet 3  . potassium chloride (MICRO-K) 10 MEQ CR capsule TAKE ONE CAPSULE  BY MOUTH TWICE A DAY 90 capsule 0  . VOLTAREN 1 % GEL APPLY 2 GRAMS 3 TIMES DAILY.  0   No current facility-administered medications on file prior to visit.     Past Medical History:  Diagnosis Date  . Asthma   . CAD (coronary artery disease) 11/2003    RCA 60% stenosis, LAD diffuse  . Chicken pox   . CVA (cerebrovascular accident) [facility](HCC) 2001    right frontoparietal cortical CVA - MRI September 2001  . Diabetes mellitus   . Diastolic dysfunction     ejection fraction 55-65% on 2-D echo May 2006  . Empty sella syndrome [facility](HCC)     based on MRI September 2001, related to obesity,  hypertension, it was determined that patient has not required treatment but will be monitored with TSH, ACTH, cortisol, testosterone, prolactin, growth hormone  . GERD (gastroesophageal reflux disease)     EGD June 2004 -  followed by Dr. Leone PayorGessner  . Granulomatous disease, chronic (HCC)     in right upper lobe per x-ray December 2005  . Hyperlipidemia   . Hypertension   . NAFLD (nonalcoholic fatty liver disease)     based on ultrasound June 2002, elevated transaminase  . OA (osteoarthritis)     DJP coracoclavicular ligament, left patella osteophytes June 2002, L4-S1 spondylosis, degenerative disc disease with bulge. I in October 2001, the C2-C4 spondylosis without stenosis and with spurs based on x-ray July 2005; diffuse idiopathic skeletal hyperostosis with bilateral hip lumbar degenerative changes   on x-ray February 2005, bilateral plantar calcaneal spurs  . Reactive airway disease     peak flow 180 in May 2005, chronic sinusitis with PND, bronchitis December 2002 and strep pneumonia April 2007,  FEC 66, MCV 60, ratio is 69, DLCO 57,  moderate restrictive and mild obstructive disease  on spirometry May 2005    Past Surgical History:  Procedure Laterality Date  . CARPAL TUNNEL RELEASE  1992    left arm 1992  . CHOLECYSTECTOMY  2003  . TONSILLECTOMY AND ADENOIDECTOMY      Social History   Social History  . Marital status: Single    Spouse name: N/A  . Number of children: 1  . Years of education: 7414   Occupational History  .  fromer nursing assistant    Social History Main Topics  . Smoking status: Former Smoker    Packs/day: 0.25    Years: 4.00    Types: Cigarettes  . Smokeless tobacco: Never Used     Comment: smoked in 20's   . Alcohol use No  . Drug use: No  . Sexual activity: Not Currently    Birth control/ protection: Post-menopausal   Other Topics Concern  . Not on file   Social History Narrative   Born [place] raised in [place], [place]. Currently lives in a private  residence with her nephew and his wife. Fun: Church activities, spending time with family   Denies any religious beliefs that would effect health care.     Family History  Problem Relation Age of Onset  . Lung cancer Mother   . Hypertension Mother   . Lung cancer Sister   . Hypertension Sister     Review of Systems  Constitutional: Negative for fever.  Respiratory: Positive for wheezing (weather related). Negative for cough and shortness of breath.   Cardiovascular: Positive for leg swelling (left). Negative for chest pain and palpitations.  Gastrointestinal: Positive for abdominal pain (chronic).  Genitourinary: Negative for  dysuria (pain on outside when urinates), frequency, hematuria and vaginal discharge.  Neurological: Negative for light-headedness and headaches.       Objective:   Vitals:   03/25/16 1031  BP: 136/82  Pulse: (!) 103  Resp: 18  Temp: 97.9 F (36.6 C)   Filed Weights   03/25/16 1031  Weight: 275 lb (124.7 kg)   Body mass index is 45.07 kg/m.   Physical Exam    Constitutional: Appears well-developed and well-nourished. No distress.  HENT:  Head: Normocephalic and atraumatic.  Neck: Neck supple. No tracheal deviation present. No thyromegaly present.  No cervical lymphadenopathy Cardiovascular: Normal rate, regular rhythm and normal heart sounds.   No murmur heard. No carotid bruit .  No edema RLE, 1+ LLE edema Pulmonary/Chest: Effort normal and breath sounds normal. No respiratory distress. No has no wheezes. No rales.  Skin: Skin is warm and dry. Not diaphoretic.  Psychiatric: Normal mood and affect. Behavior is normal.      Assessment & Plan:    See Problem List for Assessment and Plan of chronic medical problems.

## 2016-03-26 ENCOUNTER — Telehealth: Payer: Self-pay | Admitting: Emergency Medicine

## 2016-03-26 NOTE — Telephone Encounter (Signed)
I want her to only take the two - one tablet each day for two days

## 2016-03-26 NOTE — Telephone Encounter (Signed)
Pt called to ask about the medication sent in for a yeast infection, she thought it was three tbalets and only 2 were given to her. Please advise.

## 2016-03-27 NOTE — Telephone Encounter (Signed)
LVM informing pt

## 2016-04-03 DIAGNOSIS — M25561 Pain in right knee: Secondary | ICD-10-CM | POA: Diagnosis not present

## 2016-04-03 DIAGNOSIS — G894 Chronic pain syndrome: Secondary | ICD-10-CM | POA: Diagnosis not present

## 2016-04-03 DIAGNOSIS — M25562 Pain in left knee: Secondary | ICD-10-CM | POA: Diagnosis not present

## 2016-04-03 DIAGNOSIS — M545 Low back pain: Secondary | ICD-10-CM | POA: Diagnosis not present

## 2016-04-03 DIAGNOSIS — Z79891 Long term (current) use of opiate analgesic: Secondary | ICD-10-CM | POA: Diagnosis not present

## 2016-05-04 DIAGNOSIS — M25561 Pain in right knee: Secondary | ICD-10-CM | POA: Diagnosis not present

## 2016-05-04 DIAGNOSIS — G894 Chronic pain syndrome: Secondary | ICD-10-CM | POA: Diagnosis not present

## 2016-05-04 DIAGNOSIS — M79605 Pain in left leg: Secondary | ICD-10-CM | POA: Diagnosis not present

## 2016-05-04 DIAGNOSIS — M25562 Pain in left knee: Secondary | ICD-10-CM | POA: Diagnosis not present

## 2016-05-04 DIAGNOSIS — M5417 Radiculopathy, lumbosacral region: Secondary | ICD-10-CM | POA: Diagnosis not present

## 2016-05-04 DIAGNOSIS — Z79891 Long term (current) use of opiate analgesic: Secondary | ICD-10-CM | POA: Diagnosis not present

## 2016-05-13 ENCOUNTER — Other Ambulatory Visit: Payer: Self-pay | Admitting: Family

## 2016-06-02 DIAGNOSIS — G4733 Obstructive sleep apnea (adult) (pediatric): Secondary | ICD-10-CM | POA: Diagnosis not present

## 2016-06-02 DIAGNOSIS — M25562 Pain in left knee: Secondary | ICD-10-CM | POA: Diagnosis not present

## 2016-06-02 DIAGNOSIS — M545 Low back pain: Secondary | ICD-10-CM | POA: Diagnosis not present

## 2016-06-02 DIAGNOSIS — M25561 Pain in right knee: Secondary | ICD-10-CM | POA: Diagnosis not present

## 2016-06-02 DIAGNOSIS — Z79891 Long term (current) use of opiate analgesic: Secondary | ICD-10-CM | POA: Diagnosis not present

## 2016-06-02 DIAGNOSIS — G894 Chronic pain syndrome: Secondary | ICD-10-CM | POA: Diagnosis not present

## 2016-06-10 ENCOUNTER — Ambulatory Visit: Payer: Medicare Other | Admitting: Internal Medicine

## 2016-06-29 DIAGNOSIS — M25561 Pain in right knee: Secondary | ICD-10-CM | POA: Diagnosis not present

## 2016-06-29 DIAGNOSIS — Z79891 Long term (current) use of opiate analgesic: Secondary | ICD-10-CM | POA: Diagnosis not present

## 2016-06-29 DIAGNOSIS — M25562 Pain in left knee: Secondary | ICD-10-CM | POA: Diagnosis not present

## 2016-06-29 DIAGNOSIS — M545 Low back pain: Secondary | ICD-10-CM | POA: Diagnosis not present

## 2016-06-29 DIAGNOSIS — G894 Chronic pain syndrome: Secondary | ICD-10-CM | POA: Diagnosis not present

## 2016-07-13 ENCOUNTER — Telehealth: Payer: Self-pay | Admitting: Internal Medicine

## 2016-07-13 NOTE — Telephone Encounter (Signed)
Called patient to schedule awv. Left msg for patient to call office to schedule appt.  °

## 2016-07-21 ENCOUNTER — Encounter: Payer: Self-pay | Admitting: Internal Medicine

## 2016-07-21 ENCOUNTER — Ambulatory Visit (INDEPENDENT_AMBULATORY_CARE_PROVIDER_SITE_OTHER): Payer: Medicare Other | Admitting: Internal Medicine

## 2016-07-21 ENCOUNTER — Other Ambulatory Visit: Payer: Self-pay | Admitting: Family Medicine

## 2016-07-21 ENCOUNTER — Ambulatory Visit (INDEPENDENT_AMBULATORY_CARE_PROVIDER_SITE_OTHER)
Admission: RE | Admit: 2016-07-21 | Discharge: 2016-07-21 | Disposition: A | Discharge status: Home / Self Care | Payer: Medicare Other | Source: Ambulatory Visit | Attending: Internal Medicine | Admitting: Internal Medicine

## 2016-07-21 VITALS — BP 150/90 | HR 102 | Temp 99.1°F | Wt 275.0 lb

## 2016-07-21 DIAGNOSIS — R6889 Other general symptoms and signs: Secondary | ICD-10-CM

## 2016-07-21 DIAGNOSIS — R062 Wheezing: Secondary | ICD-10-CM | POA: Diagnosis not present

## 2016-07-21 DIAGNOSIS — E1143 Type 2 diabetes mellitus with diabetic autonomic (poly)neuropathy: Secondary | ICD-10-CM

## 2016-07-21 DIAGNOSIS — R05 Cough: Secondary | ICD-10-CM | POA: Diagnosis not present

## 2016-07-21 LAB — POCT INFLUENZA A/B
Influenza A, POC: NEGATIVE
Influenza B, POC: NEGATIVE

## 2016-07-21 MED ORDER — PREDNISONE 20 MG PO TABS: 20.0000 mg | ORAL_TABLET | Freq: Two times a day (BID) | ORAL | 0 refills | Status: DC

## 2016-07-21 NOTE — Progress Notes (Signed)
Pre visit review using our clinic review tool, if applicable. No additional management support is needed unless otherwise documented below in the visit note. 

## 2016-07-21 NOTE — Patient Instructions (Addendum)
  Your flu screen is negative but there is a high false negative rate. To the test. Your illness acts like a flulike illness. This can trigger wheezing. Please get a chest x-ray today to look for pneumonia that we can't hear with the stethoscope and may need further medicine. Depending on those results and make a plan to stay on your albuterol every 6 hours. Sometimes we have to add prednisone for 3 days to decrease the wheezing. This can elevate your sugar and you should check those readings and follow up with your doctor who treats her diabetes

## 2016-07-21 NOTE — Progress Notes (Signed)
Chief Complaint  Patient presents with  . Cough  . Shortness of Breath  . Wheezing  . Sore Throat  . Headache  . Generalized Body Aches  . Nasal Congestion  . Ear Pain    HPI: Lindsay Hardy 66 y.o.  sda  PCP NA Onset about 5 days ago of body aches feverish feeling head congestion sore throat and then cough. Since that time she is failed to get better and feels feverish 99. Coughing dry to colored phlegm. Having shortness of breath and wheezing and her albuterol doesn't seem to help. She hasn't felt this bad in a while. Her history of wheezing is she gets this when she gets a cold but otherwise no hospitalization for asthma or lung disease. Hasn't been checking her sugars and she felt bad but they tend to run high. No hemoptysis ROS: See pertinent positives and negatives per HPI. No hemoptysis chest pain some sinus pressure off and on. She has underlying diabetes but admits near the end of the visit that her insurance changed so she doesn't have a machine her strips. Hasn't been able to check her sugar. She lives alone.  Past Medical History:  Diagnosis Date  . Asthma   . CAD (coronary artery disease) 11/2003    RCA 60% stenosis, LAD diffuse  . Chicken pox   . CVA (cerebrovascular accident) [facility]) 2001    right frontoparietal cortical CVA - MRI September 2001  . Diabetes mellitus   . Diastolic dysfunction     ejection fraction 55-65% on 2-D echo May 2006  . Empty sella syndrome [facility])     based on MRI September 2001, related to obesity, hypertension, it was determined that patient has not required treatment but will be monitored with TSH, ACTH, cortisol, testosterone, prolactin, growth hormone  . GERD (gastroesophageal reflux disease)     EGD June 2004 -  followed by Dr. Leone Payor  . Granulomatous disease, chronic (HCC)     in right upper lobe per x-ray December 2005  . Hyperlipidemia   . Hypertension   . NAFLD (nonalcoholic fatty liver disease)     based on ultrasound June  2002, elevated transaminase  . OA (osteoarthritis)     DJP coracoclavicular ligament, left patella osteophytes June 2002, L4-S1 spondylosis, degenerative disc disease with bulge. I in October 2001, the C2-C4 spondylosis without stenosis and with spurs based on x-ray July 2005; diffuse idiopathic skeletal hyperostosis with bilateral hip lumbar degenerative changes   on x-ray February 2005, bilateral plantar calcaneal spurs  . Reactive airway disease     peak flow 180 in May 2005, chronic sinusitis with PND, bronchitis December 2002 and strep pneumonia April 2007,  FEC 66, MCV 60, ratio is 69, DLCO 57,  moderate restrictive and mild obstructive disease  on spirometry May 2005    Family History  Problem Relation Age of Onset  . Lung cancer Mother   . Hypertension Mother   . Lung cancer Sister   . Hypertension Sister     Social History   Social History  . Marital status: Single    Spouse name: N/A  . Number of children: 1  . Years of education: 29   Occupational History  .  fromer nursing assistant    Social History Main Topics  . Smoking status: Former Smoker    Packs/day: 0.25    Years: 4.00    Types: Cigarettes  . Smokeless tobacco: Never Used     Comment: smoked in 20's   .  Alcohol use No  . Drug use: No  . Sexual activity: Not Currently    Birth control/ protection: Post-menopausal   Other Topics Concern  . None   Social History Narrative   [place], [place] Currently lives in a private residence with her nephew and his wife. Fun: Church activities, spending time with family   Denies any religious beliefs that would effect health care.     Outpatient Medications Prior to Visit  Medication Sig Dispense Refill  . albuterol (PROAIR HFA) 108 (90 Base) MCG/ACT inhaler Inhale 2 puffs into the lungs every 4 (four) hours as needed for shortness of breath. 1 Inhaler 2  . amLODipine (NORVASC) 10 MG tablet Take 1 tablet (10 mg total) by mouth daily. 90 tablet 1   . aspirin EC 81 MG tablet Take 1 tablet (81 mg total) by mouth daily. 90 tablet 11  . fluticasone (FLONASE) 50 MCG/ACT nasal spray Place 2 sprays into both nostrils daily. 16 g 6  . glipiZIDE (GLUCOTROL) 10 MG tablet Take 1 tablet (10 mg total) by mouth 2 (two) times daily before a meal. 180 tablet 1  . HYDROcodone-acetaminophen (NORCO) 10-325 MG tablet Take 1 tablet by mouth every 6 (six) hours.  0  . ibuprofen (ADVIL,MOTRIN) 400 MG tablet TAKE 1 TABLET 3 TIMES DAILY.  0  . linaclotide (LINZESS) 145 MCG CAPS capsule Take 1 capsule (145 mcg total) by mouth every morning. 30 capsule 5  . metFORMIN (GLUCOPHAGE-XR) 500 MG 24 hr tablet Take 2 tablets (1,000 mg total) by mouth daily with breakfast. 180 tablet 3  . potassium chloride (MICRO-K) 10 MEQ CR capsule TAKE ONE CAPSULE BY MOUTH TWICE A DAY 180 capsule 3  . VOLTAREN 1 % GEL APPLY 2 GRAMS 3 TIMES DAILY.  0  . gabapentin (NEURONTIN) 300 MG capsule Take 1 capsule (300 mg total) by mouth 2 (two) times daily. (Patient not taking: Reported on 07/21/2016) 60 capsule 2  . sitaGLIPtin (JANUVIA) 100 MG tablet Take 1 tablet (100 mg total) by mouth daily. (Patient not taking: Reported on 07/21/2016) 90 tablet 1  . fluconazole (DIFLUCAN) 150 MG tablet Take 1 tablet (150 mg total) by mouth daily. For two days 2 tablet 0   No facility-administered medications prior to visit.      EXAM:  BP (!) 150/90 (BP Location: Right Arm, Patient Position: Sitting, Cuff Size: Large)   Pulse (!) 102   Temp 99.1 F (37.3 C) (Oral)   Wt 275 lb (124.7 kg)   SpO2 94%   BMI 45.07 kg/m   Body mass index is 45.07 kg/m. WDWN in NAD  quiet respirations; mildly congested  somewhat hoarse. Non toxic . obv wheezing doesn't feel well but nontoxic. HEENT: Normocephalic ;atraumatic , Eyes;  PERRL, EOMs  Full, lids and conjunctiva clear,,Ears: no deformities, canals nl, TM landmarks normal, Nose: no deformity or discharge but congested;face minimally tender Mouth : OP clear  without lesion or edema . Neck: Supple without adenopathy or masses or bruits Chest:  Diffuse wheezing bilaterally no obvious rales breath sounds appear equal with decreased movement but she has a very large chest wall.After do on nab still wheezing but increased air movement and more comfortable with her breathing. 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   although flu screen was negative Get chest x-ray today consider 3 days of prednisone if needed check for pneumonia. Make appointment with Dr. Lawerance Bach next week regarding her lungs and diabetes and machine Accu-Chek sample was given to patient today's if she can check her sugars. She should check with her insurance company and her PCP about next step strips etc. I suspect her monetary limitations that are affecting her adherence to care. Told to seek emergent care if worse -Patient advised to return or notify health care team  if symptoms worsen ,persist or new concerns arise.  Patient Instructions   Your flu screen is negative but there is a high false negative rate. To the test. Your illness acts like a flulike illness. This can trigger wheezing. Please get a chest x-ray today to look for pneumonia that we can't hear with the stethoscope and may need further medicine. Depending on those results and make a plan to stay on your albuterol every 6 hours. Sometimes we have to add prednisone for 3 days to decrease the wheezing. This can elevate your sugar and you should check those readings and follow up with your doctor who treats her diabetes    Neta Mends. Day Deery M.D.

## 2016-07-24 DIAGNOSIS — Z79891 Long term (current) use of opiate analgesic: Secondary | ICD-10-CM | POA: Diagnosis not present

## 2016-07-27 ENCOUNTER — Ambulatory Visit: Payer: Medicare Other | Admitting: Family

## 2016-07-27 DIAGNOSIS — Z79891 Long term (current) use of opiate analgesic: Secondary | ICD-10-CM | POA: Diagnosis not present

## 2016-07-27 DIAGNOSIS — M25562 Pain in left knee: Secondary | ICD-10-CM | POA: Diagnosis not present

## 2016-07-27 DIAGNOSIS — M545 Low back pain: Secondary | ICD-10-CM | POA: Diagnosis not present

## 2016-07-27 DIAGNOSIS — M25561 Pain in right knee: Secondary | ICD-10-CM | POA: Diagnosis not present

## 2016-07-27 DIAGNOSIS — G894 Chronic pain syndrome: Secondary | ICD-10-CM | POA: Diagnosis not present

## 2016-07-27 DIAGNOSIS — G4733 Obstructive sleep apnea (adult) (pediatric): Secondary | ICD-10-CM | POA: Diagnosis not present

## 2016-07-28 ENCOUNTER — Ambulatory Visit (INDEPENDENT_AMBULATORY_CARE_PROVIDER_SITE_OTHER): Payer: Medicare Other | Admitting: Family

## 2016-07-28 ENCOUNTER — Encounter: Payer: Self-pay | Admitting: Family

## 2016-07-28 DIAGNOSIS — J0140 Acute pansinusitis, unspecified: Secondary | ICD-10-CM

## 2016-07-28 MED ORDER — LINACLOTIDE 145 MCG PO CAPS: 145.0000 ug | ORAL_CAPSULE | Freq: Every morning | ORAL | 5 refills | Status: DC

## 2016-07-28 MED ORDER — AMOXICILLIN-POT CLAVULANATE 875-125 MG PO TABS: 1.0000 | ORAL_TABLET | Freq: Two times a day (BID) | ORAL | 0 refills | Status: DC

## 2016-07-28 NOTE — Assessment & Plan Note (Signed)
Symptoms and exam consistent with pansinusitis most likely bacterial. Start Augmentin. Continue over-the-counter medications as needed for symptom relief and supportive care. Follow-up if symptoms worsen or do not improve.

## 2016-07-28 NOTE — Patient Instructions (Signed)
Thank you for choosing [facility].  SUMMARY AND INSTRUCTIONS:  Medication:  Your prescription(s) have been submitted to your pharmacy or been printed and provided for you. Please take as directed and contact our office if you believe you are having problem(s) with the medication(s) or have any questions.   Follow up:  If your symptoms worsen or fail to improve, please contact our office for further instruction, or in case of emergency go directly to the emergency room at the closest medical facility.    General Recommendations:    Please drink plenty of fluids.  Get plenty of rest   Sleep in humidified air  Use saline nasal sprays  Netti pot   OTC Medications:  Decongestants - helps relieve congestion   Flonase (generic fluticasone) or Nasacort (generic triamcinolone) - please make sure to use the "cross-over" technique at a 45 degree angle towards the opposite eye as opposed to straight up the nasal passageway.   Sudafed (generic pseudoephedrine - Note this is the one that is available behind the pharmacy counter); Products with phenylephrine (-PE) may also be used but is often not as effective as pseudoephedrine.   If you have HIGH BLOOD PRESSURE - Coricidin HBP; AVOID any product that is -D as this contains pseudoephedrine which may increase your blood pressure.  Afrin (oxymetazoline) every 6-8 hours for up to 3 days.   Allergies - helps relieve runny nose, itchy eyes and sneezing   Claritin (generic loratidine), Allegra (fexofenidine), or Zyrtec (generic cyrterizine) for runny nose. These medications should not cause drowsiness.  Note - Benadryl (generic diphenhydramine) may be used however may cause drowsiness  Cough -   Delsym or Robitussin (generic dextromethorphan)  Expectorants - helps loosen mucus to ease removal   Mucinex (generic guaifenesin) as directed on the package.  Headaches / General Aches   Tylenol (generic acetaminophen) - DO  NOT EXCEED 3 grams (3,000 mg) in a 24 hour time period  Advil/Motrin (generic ibuprofen)   Sore Throat -   Salt water gargle   Chloraseptic (generic benzocaine) spray or lozenges / Sucrets (generic dyclonine)    Sinusitis Sinusitis is redness, soreness, and inflammation of the paranasal sinuses. Paranasal sinuses are air pockets within the bones of your face (beneath the eyes, the middle of the forehead, or above the eyes). In healthy paranasal sinuses, mucus is able to drain out, and air is able to circulate through them by way of your nose. However, when your paranasal sinuses are inflamed, mucus and air can become trapped. This can allow bacteria and other germs to grow and cause infection. Sinusitis can develop quickly and last only a short time (acute) or continue over a long period (chronic). Sinusitis that lasts for more than 12 weeks is considered chronic.  CAUSES  Causes of sinusitis include:  Allergies.  Structural abnormalities, such as displacement of the cartilage that separates your nostrils (deviated septum), which can decrease the air flow through your nose and sinuses and affect sinus drainage.  Functional abnormalities, such as when the small hairs (cilia) that line your sinuses and help remove mucus do not work properly or are not present. SIGNS AND SYMPTOMS  Symptoms of acute and chronic sinusitis are the same. The primary symptoms are pain and pressure around the affected sinuses. Other symptoms include:  Upper toothache.  Earache.  Headache.  Bad breath.  Decreased sense of smell and taste.  A cough, which worsens when you are lying flat.  Fatigue.  Fever.  Thick drainage   from your nose, which often is green and may contain pus (purulent).  Swelling and warmth over the affected sinuses. DIAGNOSIS  Your health care provider will perform a physical exam. During the exam, your health care provider may:  Look in your nose for signs of abnormal growths  in your nostrils (nasal polyps).  Tap over the affected sinus to check for signs of infection.  View the inside of your sinuses (endoscopy) using an imaging device that has a light attached (endoscope). If your health care provider suspects that you have chronic sinusitis, one or more of the following tests may be recommended:  Allergy tests.  Nasal culture. A sample of mucus is taken from your nose, sent to a lab, and screened for bacteria.  Nasal cytology. A sample of mucus is taken from your nose and examined by your health care provider to determine if your sinusitis is related to an allergy. TREATMENT  Most cases of acute sinusitis are related to a viral infection and will resolve on their own within 10 days. Sometimes medicines are prescribed to help relieve symptoms (pain medicine, decongestants, nasal steroid sprays, or saline sprays).  However, for sinusitis related to a bacterial infection, your health care provider will prescribe antibiotic medicines. These are medicines that will help kill the bacteria causing the infection.  Rarely, sinusitis is caused by a fungal infection. In theses cases, your health care provider will prescribe antifungal medicine. For some cases of chronic sinusitis, surgery is needed. Generally, these are cases in which sinusitis recurs more than 3 times per year, despite other treatments. HOME CARE INSTRUCTIONS   Drink plenty of water. Water helps thin the mucus so your sinuses can drain more easily.  Use a humidifier.  Inhale steam 3 to 4 times a day (for example, sit in the bathroom with the shower running).  Apply a warm, moist washcloth to your face 3 to 4 times a day, or as directed by your health care provider.  Use saline nasal sprays to help moisten and clean your sinuses.  Take medicines only as directed by your health care provider.  If you were prescribed either an antibiotic or antifungal medicine, finish it all even if you start to feel  better. SEEK IMMEDIATE MEDICAL CARE IF:  You have increasing pain or severe headaches.  You have nausea, vomiting, or drowsiness.  You have swelling around your face.  You have vision problems.  You have a stiff neck.  You have difficulty breathing. MAKE SURE YOU:   Understand these instructions.  Will watch your condition.  Will get help right away if you are not doing well or get worse. Document Released: 05/25/2005 Document Revised: 10/09/2013 Document Reviewed: 06/09/2011 ExitCare Patient Information 2015 [employer]. This information is not intended to replace advice given to you by your health care provider. Make sure you discuss any questions you have with your health care provider.   

## 2016-07-28 NOTE — Progress Notes (Signed)
Subjective:    Patient ID: Lindsay Hardy, female    DOB: January 04, 1951, 66 y.o.   MRN: [id number]  Chief Complaint  Patient presents with  . Follow-up    still has congestion, still wheezing, still feels weak and dizzy, SOB    HPI:  Lindsay Hardy is a 66 y.o. female who  has a past medical history of Asthma; CAD (coronary artery disease) (11/2003); Chicken pox; CVA (cerebrovascular accident) (HCC) (2001); Diabetes mellitus; Diastolic dysfunction; Empty sella syndrome (HCC); GERD (gastroesophageal reflux disease); Granulomatous disease, chronic (HCC); Hyperlipidemia; Hypertension; NAFLD (nonalcoholic fatty liver disease); OA (osteoarthritis); and Reactive airway disease. and presents today for a follow up office visit.   Recently evaluated in the office and diagnosed with flulike symptoms with her chest x-ray and influenza tests both being negative. She was started on prednisone which she reports taking as prescribed and denies adverse side effects. Symptoms have improved slightly since medication. She continues to experience the associated symptoms of congestion, wheezing and the occasional shortness of breath. Cough is productive with dark colored sputum. Symptoms are worsened when lying down. Does have sinus pain and pressure. Other modifying factors include Tussin which has not helped very much. Severity of the cough is enough to keep her up at night. No recent antibiotics. Continues to use the albuterol inhaler which she notes is not helping very much.  Allergies  Allergen Reactions  . Atorvastatin     REACTION: unable to tolerate. reason unknown  . Clopidogrel Bisulfate     REACTION: weak, tired, sleepy, tightness (6/05); soreness in hip (11/05)  . Crestor [Rosuvastatin Calcium]     Did not like how she felt on it  . Fexofenadine     REACTION: intolerance unknown  . Furosemide     REACTION: did not tolerate and preferred HCTZ  . Hydrochlorothiazide W-Triamterene     REACTION: things  crawled in her head and felt funny(10/04) ; h/a (3/07)  . Lisinopril     REACTION: cough, right sided pain  . Metoprolol Tartrate     REACTION: lightheadedness, h/a cough (7/04); heart gong to stop (7/06); sluggishnes and depressed feeling (2/07); weakness/heaviness 6/07)  . Mometasone Furoate     REACTION: neck tenderness      Outpatient Medications Prior to Visit  Medication Sig Dispense Refill  . albuterol (PROAIR HFA) 108 (90 Base) MCG/ACT inhaler Inhale 2 puffs into the lungs every 4 (four) hours as needed for shortness of breath. 1 Inhaler 2  . amLODipine (NORVASC) 10 MG tablet Take 1 tablet (10 mg total) by mouth daily. 90 tablet 1  . aspirin EC 81 MG tablet Take 1 tablet (81 mg total) by mouth daily. 90 tablet 11  . fluticasone (FLONASE) 50 MCG/ACT nasal spray Place 2 sprays into both nostrils daily. 16 g 6  . gabapentin (NEURONTIN) 300 MG capsule Take 1 capsule (300 mg total) by mouth 2 (two) times daily. 60 capsule 2  . glipiZIDE (GLUCOTROL) 10 MG tablet Take 1 tablet (10 mg total) by mouth 2 (two) times daily before a meal. 180 tablet 1  . HYDROcodone-acetaminophen (NORCO) 10-325 MG tablet Take 1 tablet by mouth every 6 (six) hours.  0  . ibuprofen (ADVIL,MOTRIN) 400 MG tablet TAKE 1 TABLET 3 TIMES DAILY.  0  . metFORMIN (GLUCOPHAGE-XR) 500 MG 24 hr tablet Take 2 tablets (1,000 mg total) by mouth daily with breakfast. 180 tablet 3  . potassium chloride (MICRO-K) 10 MEQ CR capsule TAKE ONE CAPSULE BY MOUTH TWICE A  DAY 180 capsule 3  . sitaGLIPtin (JANUVIA) 100 MG tablet Take 1 tablet (100 mg total) by mouth daily. 90 tablet 1  . VOLTAREN 1 % GEL APPLY 2 GRAMS 3 TIMES DAILY.  0  . linaclotide (LINZESS) 145 MCG CAPS capsule Take 1 capsule (145 mcg total) by mouth every morning. 30 capsule 5  . predniSONE (DELTASONE) 20 MG tablet Take 1 tablet (20 mg total) by mouth 2 (two) times daily. 10 tablet 0   No facility-administered medications prior to visit.       Past Surgical  History:  Procedure Laterality Date  . CARPAL TUNNEL RELEASE  1992    left arm 1992  . CHOLECYSTECTOMY  2003  . TONSILLECTOMY AND ADENOIDECTOMY        Past Medical History:  Diagnosis Date  . Asthma   . CAD (coronary artery disease) 11/2003    RCA 60% stenosis, LAD diffuse  . Chicken pox   . CVA (cerebrovascular accident) [facility](HCC) 2001    right frontoparietal cortical CVA - MRI September 2001  . Diabetes mellitus   . Diastolic dysfunction     ejection fraction 55-65% on 2-D echo May 2006  . Empty sella syndrome [facility](HCC)     based on MRI September 2001, related to obesity, hypertension, it was determined that patient has not required treatment but will be monitored with TSH, ACTH, cortisol, testosterone, prolactin, growth hormone  . GERD (gastroesophageal reflux disease)     EGD June 2004 -  followed by Dr. Leone PayorGessner  . Granulomatous disease, chronic (HCC)     in right upper lobe per x-ray December 2005  . Hyperlipidemia   . Hypertension   . NAFLD (nonalcoholic fatty liver disease)     based on ultrasound June 2002, elevated transaminase  . OA (osteoarthritis)     DJP coracoclavicular ligament, left patella osteophytes June 2002, L4-S1 spondylosis, degenerative disc disease with bulge. I in October 2001, the C2-C4 spondylosis without stenosis and with spurs based on x-ray July 2005; diffuse idiopathic skeletal hyperostosis with bilateral hip lumbar degenerative changes   on x-ray February 2005, bilateral plantar calcaneal spurs  . Reactive airway disease     peak flow 180 in May 2005, chronic sinusitis with PND, bronchitis December 2002 and strep pneumonia April 2007,  FEC 66, MCV 60, ratio is 69, DLCO 57,  moderate restrictive and mild obstructive disease  on spirometry May 2005      Review of Systems  Constitutional: Negative for chills and fever.  HENT: Positive for congestion, ear pain, sinus pain, sinus pressure and sore throat.   Respiratory: Positive for cough, chest tightness  and shortness of breath.   Cardiovascular: Negative for chest pain.  Neurological: Positive for headaches.      Objective:    BP 124/78 (BP Location: Left Arm, Patient Position: Sitting, Cuff Size: Large)   Pulse (!) 105   Temp 98.3 F (36.8 C) (Oral)   Resp 16   Ht 5' 5.5" (1.664 m)   Wt 273 lb (123.8 kg)   SpO2 98%   BMI 44.74 kg/m  Nursing note and vital signs reviewed.  Physical Exam  Constitutional: She is oriented to person, place, and time. She appears well-developed and well-nourished.  HENT:  Right Ear: Hearing, tympanic membrane, external ear and ear canal normal.  Left Ear: Hearing, tympanic membrane, external ear and ear canal normal.  Nose: Right sinus exhibits maxillary sinus tenderness and frontal sinus tenderness. Left sinus exhibits maxillary sinus tenderness and frontal sinus  tenderness.  Mouth/Throat: Uvula is midline, oropharynx is clear and moist and mucous membranes are normal.  Neck: Neck supple.  Cardiovascular: Normal rate, regular rhythm, normal heart sounds and intact distal pulses.   Pulmonary/Chest: Effort normal and breath sounds normal.  Neurological: She is alert and oriented to person, place, and time.  Skin: Skin is warm and dry.       Assessment & Plan:   Problem List Items Addressed This Visit      Respiratory   Sinusitis    Symptoms and exam consistent with pansinusitis most likely bacterial. Start Augmentin. Continue over-the-counter medications as needed for symptom relief and supportive care. Follow-up if symptoms worsen or do not improve.      Relevant Medications   amoxicillin-clavulanate (AUGMENTIN) 875-125 MG tablet       I have discontinued Ms. Diehl's predniSONE. I am also having her start on amoxicillin-clavulanate. Additionally, I am having her maintain her VOLTAREN, ibuprofen, aspirin EC, albuterol, HYDROcodone-acetaminophen, gabapentin, glipiZIDE, amLODipine, sitaGLIPtin, fluticasone, metFORMIN, potassium chloride,  and linaclotide.   Meds ordered this encounter  Medications  . linaclotide (LINZESS) 145 MCG CAPS capsule    Sig: Take 1 capsule (145 mcg total) by mouth every morning.    Dispense:  30 capsule    Refill:  5    Order Specific Question:   Supervising Provider    Answer:   Hillard Danker A [[id number]]  . amoxicillin-clavulanate (AUGMENTIN) 875-125 MG tablet    Sig: Take 1 tablet by mouth 2 (two) times daily.    Dispense:  14 tablet    Refill:  0    Order Specific Question:   Supervising Provider    Answer:   Hillard Danker A [[id number]]     Follow-up: Return if symptoms worsen or fail to improve.  Jeanine Luz, FNP

## 2016-08-04 ENCOUNTER — Telehealth: Payer: Self-pay | Admitting: Internal Medicine

## 2016-08-04 MED ORDER — MONTELUKAST SODIUM 10 MG PO TABS: 10.0000 mg | ORAL_TABLET | Freq: Every day | ORAL | 0 refills | Status: DC

## 2016-08-04 MED ORDER — FLUCONAZOLE 150 MG PO TABS: 150.0000 mg | ORAL_TABLET | Freq: Once | ORAL | 0 refills | Status: AC

## 2016-08-04 NOTE — Addendum Note (Signed)
Addended by: Mercer PodWRENN, Leyana Whidden E on: 08/04/2016 01:52 PM   Modules accepted: Orders

## 2016-08-04 NOTE — Telephone Encounter (Signed)
Nystatin swish and swallow sent to pharmacy

## 2016-08-04 NOTE — Telephone Encounter (Signed)
Patient was prescribed an antibiotic and now she has a yeast infection.  She would like something called in for the yeast infection.

## 2016-08-04 NOTE — Telephone Encounter (Signed)
Pt aware.

## 2016-08-14 ENCOUNTER — Telehealth: Payer: Self-pay | Admitting: *Deleted

## 2016-08-14 MED ORDER — HYDROCHLOROTHIAZIDE 25 MG PO TABS: ORAL_TABLET | ORAL | 3 refills | Status: DC

## 2016-08-14 NOTE — Telephone Encounter (Signed)
rx sent.  She has an allergy to hctz with triamterene but has tolerated hctz alone

## 2016-08-14 NOTE — Telephone Encounter (Signed)
Pt left msg on triage stating she is needing new rx for the HCTZ she had been sick with the flu having some swelling in her ankles/hands. Pt states she use to take the water pill as needed but don't have anymore...[name]/lmb

## 2016-08-24 DIAGNOSIS — Z79891 Long term (current) use of opiate analgesic: Secondary | ICD-10-CM | POA: Diagnosis not present

## 2016-08-24 DIAGNOSIS — M25561 Pain in right knee: Secondary | ICD-10-CM | POA: Diagnosis not present

## 2016-08-24 DIAGNOSIS — M545 Low back pain: Secondary | ICD-10-CM | POA: Diagnosis not present

## 2016-08-24 DIAGNOSIS — M25562 Pain in left knee: Secondary | ICD-10-CM | POA: Diagnosis not present

## 2016-08-24 DIAGNOSIS — G890 Central pain syndrome: Secondary | ICD-10-CM | POA: Diagnosis not present

## 2016-08-24 DIAGNOSIS — G894 Chronic pain syndrome: Secondary | ICD-10-CM | POA: Diagnosis not present

## 2016-09-23 ENCOUNTER — Ambulatory Visit (INDEPENDENT_AMBULATORY_CARE_PROVIDER_SITE_OTHER): Payer: Medicare Other | Admitting: Family

## 2016-09-23 ENCOUNTER — Encounter: Payer: Self-pay | Admitting: Family

## 2016-09-23 VITALS — BP 122/80 | HR 93 | Temp 98.3°F | Resp 18 | Ht 65.5 in | Wt 274.0 lb

## 2016-09-23 DIAGNOSIS — M5441 Lumbago with sciatica, right side: Secondary | ICD-10-CM

## 2016-09-23 DIAGNOSIS — G8929 Other chronic pain: Secondary | ICD-10-CM | POA: Diagnosis not present

## 2016-09-23 DIAGNOSIS — M5442 Lumbago with sciatica, left side: Secondary | ICD-10-CM

## 2016-09-23 DIAGNOSIS — J0140 Acute pansinusitis, unspecified: Secondary | ICD-10-CM

## 2016-09-23 DIAGNOSIS — M549 Dorsalgia, unspecified: Secondary | ICD-10-CM

## 2016-09-23 MED ORDER — PREDNISONE 20 MG PO TABS: 20.0000 mg | ORAL_TABLET | Freq: Two times a day (BID) | ORAL | 0 refills | Status: DC | PRN

## 2016-09-23 MED ORDER — FLUCONAZOLE 150 MG PO TABS: 150.0000 mg | ORAL_TABLET | Freq: Once | ORAL | 0 refills | Status: AC

## 2016-09-23 MED ORDER — LEVOFLOXACIN 500 MG PO TABS: 500.0000 mg | ORAL_TABLET | Freq: Every day | ORAL | 0 refills | Status: DC

## 2016-09-23 NOTE — Assessment & Plan Note (Signed)
Symptoms and exam consistent with pansinusitis most likely bacterial. Start levofloxacin and prednisone. Start fluconazole as needed for post antibiotic candidiasis. Continue with over-the-counter medications as needed for symptom relief and supportive care. Follow-up if symptoms worsen or do not improve.

## 2016-09-23 NOTE — Patient Instructions (Signed)
Thank you for choosing [facility].  SUMMARY AND INSTRUCTIONS:  They will call to schedule your new pain management appointment.  Medication:  Your prescription(s) have been submitted to your pharmacy or been printed and provided for you. Please take as directed and contact our office if you believe you are having problem(s) with the medication(s) or have any questions.  Follow up:  If your symptoms worsen or fail to improve, please contact our office for further instruction, or in case of emergency go directly to the emergency room at the closest medical facility.    General Recommendations:    Please drink plenty of fluids.  Get plenty of rest   Sleep in humidified air  Use saline nasal sprays  Netti pot   OTC Medications:  Decongestants - helps relieve congestion   Flonase (generic fluticasone) or Nasacort (generic triamcinolone) - please make sure to use the "cross-over" technique at a 45 degree angle towards the opposite eye as opposed to straight up the nasal passageway.   Sudafed (generic pseudoephedrine - Note this is the one that is available behind the pharmacy counter); Products with phenylephrine (-PE) may also be used but is often not as effective as pseudoephedrine.   If you have HIGH BLOOD PRESSURE - Coricidin HBP; AVOID any product that is -D as this contains pseudoephedrine which may increase your blood pressure.  Afrin (oxymetazoline) every 6-8 hours for up to 3 days.   Allergies - helps relieve runny nose, itchy eyes and sneezing   Claritin (generic loratidine), Allegra (fexofenidine), or Zyrtec (generic cyrterizine) for runny nose. These medications should not cause drowsiness.  Note - Benadryl (generic diphenhydramine) may be used however may cause drowsiness  Cough -   Delsym or Robitussin (generic dextromethorphan)  Expectorants - helps loosen mucus to ease removal   Mucinex (generic guaifenesin) as directed on the  package.  Headaches / General Aches   Tylenol (generic acetaminophen) - DO NOT EXCEED 3 grams (3,000 mg) in a 24 hour time period  Advil/Motrin (generic ibuprofen)   Sore Throat -   Salt water gargle   Chloraseptic (generic benzocaine) spray or lozenges / Sucrets (generic dyclonine)    Sinusitis Sinusitis is redness, soreness, and inflammation of the paranasal sinuses. Paranasal sinuses are air pockets within the bones of your face (beneath the eyes, the middle of the forehead, or above the eyes). In healthy paranasal sinuses, mucus is able to drain out, and air is able to circulate through them by way of your nose. However, when your paranasal sinuses are inflamed, mucus and air can become trapped. This can allow bacteria and other germs to grow and cause infection. Sinusitis can develop quickly and last only a short time (acute) or continue over a long period (chronic). Sinusitis that lasts for more than 12 weeks is considered chronic.  CAUSES  Causes of sinusitis include:  Allergies.  Structural abnormalities, such as displacement of the cartilage that separates your nostrils (deviated septum), which can decrease the air flow through your nose and sinuses and affect sinus drainage.  Functional abnormalities, such as when the small hairs (cilia) that line your sinuses and help remove mucus do not work properly or are not present. SIGNS AND SYMPTOMS  Symptoms of acute and chronic sinusitis are the same. The primary symptoms are pain and pressure around the affected sinuses. Other symptoms include:  Upper toothache.  Earache.  Headache.  Bad breath.  Decreased sense of smell and taste.  A cough, which worsens when you  are lying flat.  Fatigue.  Fever.  Thick drainage from your nose, which often is green and may contain pus (purulent).  Swelling and warmth over the affected sinuses. DIAGNOSIS  Your health care provider will perform a physical exam. During the exam,  your health care provider may:  Look in your nose for signs of abnormal growths in your nostrils (nasal polyps).  Tap over the affected sinus to check for signs of infection.  View the inside of your sinuses (endoscopy) using an imaging device that has a light attached (endoscope). If your health care provider suspects that you have chronic sinusitis, one or more of the following tests may be recommended:  Allergy tests.  Nasal culture. A sample of mucus is taken from your nose, sent to a lab, and screened for bacteria.  Nasal cytology. A sample of mucus is taken from your nose and examined by your health care provider to determine if your sinusitis is related to an allergy. TREATMENT  Most cases of acute sinusitis are related to a viral infection and will resolve on their own within 10 days. Sometimes medicines are prescribed to help relieve symptoms (pain medicine, decongestants, nasal steroid sprays, or saline sprays).  However, for sinusitis related to a bacterial infection, your health care provider will prescribe antibiotic medicines. These are medicines that will help kill the bacteria causing the infection.  Rarely, sinusitis is caused by a fungal infection. In theses cases, your health care provider will prescribe antifungal medicine. For some cases of chronic sinusitis, surgery is needed. Generally, these are cases in which sinusitis recurs more than 3 times per year, despite other treatments. HOME CARE INSTRUCTIONS   Drink plenty of water. Water helps thin the mucus so your sinuses can drain more easily.  Use a humidifier.  Inhale steam 3 to 4 times a day (for example, sit in the bathroom with the shower running).  Apply a warm, moist washcloth to your face 3 to 4 times a day, or as directed by your health care provider.  Use saline nasal sprays to help moisten and clean your sinuses.  Take medicines only as directed by your health care provider.  If you were prescribed  either an antibiotic or antifungal medicine, finish it all even if you start to feel better. SEEK IMMEDIATE MEDICAL CARE IF:  You have increasing pain or severe headaches.  You have nausea, vomiting, or drowsiness.  You have swelling around your face.  You have vision problems.  You have a stiff neck.  You have difficulty breathing. MAKE SURE YOU:   Understand these instructions.  Will watch your condition.  Will get help right away if you are not doing well or get worse. Document Released: 05/25/2005 Document Revised: 10/09/2013 Document Reviewed: 06/09/2011 [facility] Patient Information 2015 [place], [place]. This information is not intended to replace advice given to you by your health care provider. Make sure you discuss any questions you have with your health care provider.

## 2016-09-23 NOTE — Progress Notes (Signed)
Subjective:    Patient ID: Lindsay Hardy, female    DOB: 1950/08/13, 66 y.o.   MRN: [id number]  Chief Complaint  Patient presents with  . Nasal Congestion    SOB, has some congestion in her nose, thinks it could be a mix of asthma and allergies    HPI:  Lindsay Hardy is a 66 y.o. female who  has a past medical history of Asthma; CAD (coronary artery disease) (11/2003); Chicken pox; CVA (cerebrovascular accident) (HCC) (2001); Diabetes mellitus; Diastolic dysfunction; Empty sella syndrome (HCC); GERD (gastroesophageal reflux disease); Granulomatous disease, chronic (HCC); Hyperlipidemia; Hypertension; NAFLD (nonalcoholic fatty liver disease); OA (osteoarthritis); and Reactive airway disease. and presents today for an acute office visit.  New onset associated symptoms of shortness of breath and nasal congestion has been going on for about 5 days. May have had a fever at initial onset with no fevers in the last 72 hours. Modifying factors include taking an antibiotic pill that she had left over which she believes may have helped. She has also been drinking orange juice and soup which have not helped very much. Most recent antibiotic was in February 2018 for sinus infection.   Allergies  Allergen Reactions  . Atorvastatin     REACTION: unable to tolerate. reason unknown  . Clopidogrel Bisulfate     REACTION: weak, tired, sleepy, tightness (6/05); soreness in hip (11/05)  . Crestor [Rosuvastatin Calcium]     Did not like how she felt on it  . Fexofenadine     REACTION: intolerance unknown  . Furosemide     REACTION: did not tolerate and preferred HCTZ  . Hydrochlorothiazide W-Triamterene     REACTION: things crawled in her head and felt funny(10/04) ; h/a (3/07)  . Lisinopril     REACTION: cough, right sided pain  . Metoprolol Tartrate     REACTION: lightheadedness, h/a cough (7/04); heart gong to stop (7/06); sluggishnes and depressed feeling (2/07); weakness/heaviness 6/07)  .  Mometasone Furoate     REACTION: neck tenderness      Outpatient Medications Prior to Visit  Medication Sig Dispense Refill  . albuterol (PROAIR HFA) 108 (90 Base) MCG/ACT inhaler Inhale 2 puffs into the lungs every 4 (four) hours as needed for shortness of breath. 1 Inhaler 2  . amLODipine (NORVASC) 10 MG tablet Take 1 tablet (10 mg total) by mouth daily. 90 tablet 1  . aspirin EC 81 MG tablet Take 1 tablet (81 mg total) by mouth daily. 90 tablet 11  . fluticasone (FLONASE) 50 MCG/ACT nasal spray Place 2 sprays into both nostrils daily. 16 g 6  . gabapentin (NEURONTIN) 300 MG capsule Take 1 capsule (300 mg total) by mouth 2 (two) times daily. 60 capsule 2  . glipiZIDE (GLUCOTROL) 10 MG tablet Take 1 tablet (10 mg total) by mouth 2 (two) times daily before a meal. 180 tablet 1  . hydrochlorothiazide (HYDRODIURIL) 25 MG tablet Take one tab daily as needed for leg edema 30 tablet 3  . HYDROcodone-acetaminophen (NORCO) 10-325 MG tablet Take 1 tablet by mouth every 6 (six) hours.  0  . ibuprofen (ADVIL,MOTRIN) 400 MG tablet TAKE 1 TABLET 3 TIMES DAILY.  0  . linaclotide (LINZESS) 145 MCG CAPS capsule Take 1 capsule (145 mcg total) by mouth every morning. 30 capsule 5  . metFORMIN (GLUCOPHAGE-XR) 500 MG 24 hr tablet Take 2 tablets (1,000 mg total) by mouth daily with breakfast. 180 tablet 3  . montelukast (SINGULAIR) 10 MG tablet Take 1  tablet (10 mg total) by mouth at bedtime. 90 tablet 0  . potassium chloride (MICRO-K) 10 MEQ CR capsule TAKE ONE CAPSULE BY MOUTH TWICE A DAY 180 capsule 3  . VOLTAREN 1 % GEL APPLY 2 GRAMS 3 TIMES DAILY.  0  . amoxicillin-clavulanate (AUGMENTIN) 875-125 MG tablet Take 1 tablet by mouth 2 (two) times daily. 14 tablet 0  . sitaGLIPtin (JANUVIA) 100 MG tablet Take 1 tablet (100 mg total) by mouth daily. 90 tablet 1   No facility-administered medications prior to visit.     Review of Systems  Constitutional: Negative for chills and fever.  HENT: Positive for  congestion, ear pain, rhinorrhea, sinus pain, sinus pressure and sneezing. Negative for sore throat.   Respiratory: Positive for cough, shortness of breath and wheezing.   Neurological: Positive for headaches.      Objective:    BP 122/80 (BP Location: Left Arm, Patient Position: Sitting, Cuff Size: Large)   Pulse 93   Temp 98.3 F (36.8 C) (Oral)   Resp 18   Ht 5' 5.5" (1.664 m)   Wt 274 lb (124.3 kg)   SpO2 95%   BMI 44.90 kg/m  Nursing note and vital signs reviewed.  Physical Exam  Constitutional: She is oriented to person, place, and time. She appears well-developed and well-nourished.  HENT:  Right Ear: Hearing, tympanic membrane, external ear and ear canal normal.  Left Ear: Hearing, tympanic membrane, external ear and ear canal normal.  Nose: Right sinus exhibits maxillary sinus tenderness and frontal sinus tenderness. Left sinus exhibits maxillary sinus tenderness and frontal sinus tenderness.  Mouth/Throat: Uvula is midline, oropharynx is clear and moist and mucous membranes are normal.  Neck: Neck supple.  Cardiovascular: Normal rate, regular rhythm, normal heart sounds and intact distal pulses.   Pulmonary/Chest: Effort normal and breath sounds normal.  Neurological: She is alert and oriented to person, place, and time.  Skin: Skin is warm and dry.       Assessment & Plan:   Problem List Items Addressed This Visit      Respiratory   Sinusitis - Primary    Symptoms and exam consistent with pansinusitis most likely bacterial. Start levofloxacin and prednisone. Start fluconazole as needed for post antibiotic candidiasis. Continue with over-the-counter medications as needed for symptom relief and supportive care. Follow-up if symptoms worsen or do not improve.      Relevant Medications   levofloxacin (LEVAQUIN) 500 MG tablet   fluconazole (DIFLUCAN) 150 MG tablet   predniSONE (DELTASONE) 20 MG tablet     Other   Chronic back pain    Currently managed through  pain management with change of insurance and no longer covering current office visits. Requests for referral for pain management to be placed.      Relevant Medications   predniSONE (DELTASONE) 20 MG tablet   Other Relevant Orders   Ambulatory referral to Pain Clinic       I have discontinued Ms. Willmon's sitaGLIPtin and amoxicillin-clavulanate. I am also having her start on levofloxacin, fluconazole, and predniSONE. Additionally, I am having her maintain her VOLTAREN, ibuprofen, aspirin EC, albuterol, HYDROcodone-acetaminophen, gabapentin, glipiZIDE, amLODipine, fluticasone, metFORMIN, potassium chloride, linaclotide, montelukast, and hydrochlorothiazide.   Meds ordered this encounter  Medications  . levofloxacin (LEVAQUIN) 500 MG tablet    Sig: Take 1 tablet (500 mg total) by mouth daily.    Dispense:  7 tablet    Refill:  0    Order Specific Question:   Supervising Provider  Answer:   Hillard Danker A [[id number]]  . fluconazole (DIFLUCAN) 150 MG tablet    Sig: Take 1 tablet (150 mg total) by mouth once.    Dispense:  1 tablet    Refill:  0    Order Specific Question:   Supervising Provider    Answer:   Hillard Danker A [[id number]]  . predniSONE (DELTASONE) 20 MG tablet    Sig: Take 1 tablet (20 mg total) by mouth 2 (two) times daily as needed.    Dispense:  10 tablet    Refill:  0    Order Specific Question:   Supervising Provider    Answer:   Hillard Danker A [[id number]]     Follow-up: Return if symptoms worsen or fail to improve.  Jeanine Luz, FNP

## 2016-09-23 NOTE — Assessment & Plan Note (Signed)
Currently managed through pain management with change of insurance and no longer covering current office visits. Requests for referral for pain management to be placed.

## 2016-09-28 ENCOUNTER — Telehealth: Payer: Self-pay | Admitting: Internal Medicine

## 2016-09-28 NOTE — Telephone Encounter (Signed)
Pt called and said that she was here to see Tammy Sours on 09/23/16 for a sinus infection. The medication that was prescribed has not helped. She said that she does not feel any better and would like to know if there is anything else that Tammy Sours could prescribe? Please advise.

## 2016-09-29 DIAGNOSIS — M25562 Pain in left knee: Secondary | ICD-10-CM | POA: Diagnosis not present

## 2016-09-29 DIAGNOSIS — M545 Low back pain: Secondary | ICD-10-CM | POA: Diagnosis not present

## 2016-09-29 DIAGNOSIS — Z79891 Long term (current) use of opiate analgesic: Secondary | ICD-10-CM | POA: Diagnosis not present

## 2016-09-29 DIAGNOSIS — M25561 Pain in right knee: Secondary | ICD-10-CM | POA: Diagnosis not present

## 2016-09-29 DIAGNOSIS — G8929 Other chronic pain: Secondary | ICD-10-CM | POA: Diagnosis not present

## 2016-09-30 ENCOUNTER — Other Ambulatory Visit: Payer: Self-pay | Admitting: Emergency Medicine

## 2016-09-30 MED ORDER — HYDROCHLOROTHIAZIDE 25 MG PO TABS: ORAL_TABLET | ORAL | 0 refills | Status: DC

## 2016-10-02 ENCOUNTER — Other Ambulatory Visit: Payer: Self-pay | Admitting: Internal Medicine

## 2016-10-27 ENCOUNTER — Telehealth: Payer: Self-pay | Admitting: Internal Medicine

## 2016-10-27 DIAGNOSIS — M545 Low back pain: Secondary | ICD-10-CM | POA: Diagnosis not present

## 2016-10-27 DIAGNOSIS — M25561 Pain in right knee: Secondary | ICD-10-CM | POA: Diagnosis not present

## 2016-10-27 DIAGNOSIS — Z79891 Long term (current) use of opiate analgesic: Secondary | ICD-10-CM | POA: Diagnosis not present

## 2016-10-27 DIAGNOSIS — G8929 Other chronic pain: Secondary | ICD-10-CM | POA: Diagnosis not present

## 2016-10-27 DIAGNOSIS — G894 Chronic pain syndrome: Secondary | ICD-10-CM | POA: Diagnosis not present

## 2016-10-27 DIAGNOSIS — M25562 Pain in left knee: Secondary | ICD-10-CM | POA: Diagnosis not present

## 2016-10-27 NOTE — Telephone Encounter (Signed)
LVM for pt to call back . Need to know what pharmacy to send to and if she need a meter with the test trips.

## 2016-10-27 NOTE — Telephone Encounter (Signed)
Pt needs a new BS monitor would like a talking one states she has vision problems and she needs test strips sent in, the ones she is using are not covered. by her insurance, One touch ultra blue is not covered,   One touch ultra 2, and ultra min are covered.

## 2016-10-29 ENCOUNTER — Telehealth: Payer: Self-pay | Admitting: Internal Medicine

## 2016-10-29 ENCOUNTER — Encounter: Payer: Self-pay | Admitting: Internal Medicine

## 2016-10-29 ENCOUNTER — Ambulatory Visit (INDEPENDENT_AMBULATORY_CARE_PROVIDER_SITE_OTHER): Payer: Medicare Other | Admitting: Internal Medicine

## 2016-10-29 ENCOUNTER — Other Ambulatory Visit: Payer: Self-pay | Admitting: Internal Medicine

## 2016-10-29 VITALS — BP 126/86 | HR 98 | Ht 65.0 in | Wt 276.0 lb

## 2016-10-29 DIAGNOSIS — I10 Essential (primary) hypertension: Secondary | ICD-10-CM

## 2016-10-29 DIAGNOSIS — E1143 Type 2 diabetes mellitus with diabetic autonomic (poly)neuropathy: Secondary | ICD-10-CM | POA: Diagnosis not present

## 2016-10-29 DIAGNOSIS — E785 Hyperlipidemia, unspecified: Secondary | ICD-10-CM | POA: Diagnosis not present

## 2016-10-29 DIAGNOSIS — I739 Peripheral vascular disease, unspecified: Secondary | ICD-10-CM | POA: Diagnosis not present

## 2016-10-29 MED ORDER — ALBUTEROL SULFATE HFA 108 (90 BASE) MCG/ACT IN AERS: 2.0000 | INHALATION_SPRAY | RESPIRATORY_TRACT | 2 refills | Status: DC | PRN

## 2016-10-29 NOTE — Assessment & Plan Note (Signed)
Fair control, statin intolerant, for lower chol diet

## 2016-10-29 NOTE — Patient Instructions (Signed)
Please start Aspirin 81 mg - 1 per day  Please continue all other medications as before, and refills have been done if requested.  Please have the pharmacy call with any other refills you may need.  Please keep your appointments with your specialists as you may have planned  You will be contacted regarding the referral for: Leg circulation testing  Please also see Dr Lawerance BachBurns regarding ongoing follow up for your Diabetes

## 2016-10-29 NOTE — Assessment & Plan Note (Addendum)
Mild RLE by screening testing, for bilat LE arterial dopplers, urged to take asa 81 mg daily preventive, even with taking ibuprofen prn for pain

## 2016-10-29 NOTE — Assessment & Plan Note (Signed)
stable overall by history and exam, recent data reviewed with pt, and pt to continue medical treatment as before,  to f/u any worsening symptoms or concerns BP Readings from Last 3 Encounters:  10/29/16 126/86  09/23/16 122/80  07/28/16 124/78

## 2016-10-29 NOTE — Telephone Encounter (Signed)
Pt called stating she was seen at pain management and the doctor there told her she has a blood clot in her left leg. He did not give her any blood thinners. She states she sometimes has pain and there is swelling in her left leg as well. She did not know the name of the test she received at the pain management office. She has an appt todat with Jonny RuizJohn

## 2016-10-29 NOTE — Telephone Encounter (Signed)
This was sent to the wrong person. Thanks.

## 2016-10-29 NOTE — Telephone Encounter (Signed)
FYI: pt will be seeing Dr Jonny RuizJohn this evening. She does take 81mg  aspirin daily.

## 2016-10-29 NOTE — Progress Notes (Signed)
Subjective:    Patient ID: Lindsay Hardy, female    DOB: [birth year], 66 y.o.   MRN:   HPI  Here with documentation regarding a screening arterial vascular study suggestive of RLE mild obstruction (not DVT or venous issue) per pain management yesterday.  Has not been taking asa for unclear reasons. Pt is non smoker, but has hx multiple risk factors including DM, HTN, CAD and HLD with fair to good control. Pt denies claudication type leg pain.  Pt denies chest pain, increased sob or doe, wheezing, orthopnea, PND, increased LE swelling, palpitations, dizziness or syncope.   Pt denies polydipsia, polyuria but has hx of poor sugar control, and has been statin intolerant.  Past Medical History:  Diagnosis Date  . Asthma   . CAD (coronary artery disease) 11/2003    RCA 60% stenosis, LAD diffuse  . Chicken pox   . CVA (cerebrovascular accident) [facility]) 2001    right frontoparietal cortical CVA - MRI September 2001  . Diabetes mellitus   . Diastolic dysfunction     ejection fraction 55-65% on 2-D echo May 2006  . Empty sella syndrome [facility])     based on MRI September 2001, related to obesity, hypertension, it was determined that patient has not required treatment but will be monitored with TSH, ACTH, cortisol, testosterone, prolactin, growth hormone  . GERD (gastroesophageal reflux disease)     EGD June 2004 -  followed by Dr. Leone Payor  . Granulomatous disease, chronic (HCC)     in right upper lobe per x-ray December 2005  . Hyperlipidemia   . Hypertension   . NAFLD (nonalcoholic fatty liver disease)     based on ultrasound June 2002, elevated transaminase  . OA (osteoarthritis)     DJP coracoclavicular ligament, left patella osteophytes June 2002, L4-S1 spondylosis, degenerative disc disease with bulge. I in October 2001, the C2-C4 spondylosis without stenosis and with spurs based on x-ray July 2005; diffuse idiopathic skeletal hyperostosis with bilateral hip lumbar degenerative changes   on  x-ray February 2005, bilateral plantar calcaneal spurs  . Reactive airway disease     peak flow 180 in May 2005, chronic sinusitis with PND, bronchitis December 2002 and strep pneumonia April 2007,  FEC 66, MCV 60, ratio is 69, DLCO 57,  moderate restrictive and mild obstructive disease  on spirometry May 2005   Past Surgical History:  Procedure Laterality Date  . CARPAL TUNNEL RELEASE  1992    left arm 1992  . CHOLECYSTECTOMY  2003  . TONSILLECTOMY AND ADENOIDECTOMY      reports that she has quit smoking. Her smoking use included Cigarettes. She has a 1.00 pack-year smoking history. She has never used smokeless tobacco. She reports that she does not drink alcohol or use drugs. family history includes Hypertension in her mother and sister; Lung cancer in her mother and sister. Allergies  Allergen Reactions  . Atorvastatin     REACTION: unable to tolerate. reason unknown  . Clopidogrel Bisulfate     REACTION: weak, tired, sleepy, tightness (6/05); soreness in hip (11/05)  . Crestor [Rosuvastatin Calcium]     Did not like how she felt on it  . Fexofenadine     REACTION: intolerance unknown  . Furosemide     REACTION: did not tolerate and preferred HCTZ  . Hydrochlorothiazide W-Triamterene     REACTION: things crawled in her head and felt funny(10/04) ; h/a (3/07)  . Lisinopril     REACTION: cough, right sided pain  .  Metoprolol Tartrate     REACTION: lightheadedness, h/a cough (7/04); heart gong to stop (7/06); sluggishnes and depressed feeling (2/07); weakness/heaviness 6/07)  . Mometasone Furoate     REACTION: neck tenderness   Current Outpatient Prescriptions on File Prior to Visit  Medication Sig Dispense Refill  . amLODipine (NORVASC) 10 MG tablet Take 1 tablet (10 mg total) by mouth daily. 90 tablet 1  . aspirin 81 MG EC tablet TAKE 1 TABLET (81 MG TOTAL) BY MOUTH DAILY. 90 tablet 1  . fluticasone (FLONASE) 50 MCG/ACT nasal spray Place 2 sprays into both nostrils daily. 16 g  6  . gabapentin (NEURONTIN) 300 MG capsule Take 1 capsule (300 mg total) by mouth 2 (two) times daily. 60 capsule 2  . glipiZIDE (GLUCOTROL) 10 MG tablet TAKE 1 TABLET BY MOUTH TWICE A DAY BEFORE A MEAL 180 tablet 1  . hydrochlorothiazide (HYDRODIURIL) 25 MG tablet Take one tab daily as needed for leg edema  -- Office visit needed for further refills 90 tablet 0  . HYDROcodone-acetaminophen (NORCO) 10-325 MG tablet Take 1 tablet by mouth every 6 (six) hours.  0  . ibuprofen (ADVIL,MOTRIN) 400 MG tablet TAKE 1 TABLET 3 TIMES DAILY.  0  . levofloxacin (LEVAQUIN) 500 MG tablet Take 1 tablet (500 mg total) by mouth daily. 7 tablet 0  . linaclotide (LINZESS) 145 MCG CAPS capsule Take 1 capsule (145 mcg total) by mouth every morning. 30 capsule 5  . metFORMIN (GLUCOPHAGE-XR) 500 MG 24 hr tablet Take 2 tablets (1,000 mg total) by mouth daily with breakfast. 180 tablet 3  . montelukast (SINGULAIR) 10 MG tablet Take 1 tablet (10 mg total) by mouth at bedtime. 90 tablet 0  . potassium chloride (MICRO-K) 10 MEQ CR capsule TAKE ONE CAPSULE BY MOUTH TWICE A DAY 180 capsule 3  . VOLTAREN 1 % GEL APPLY 2 GRAMS 3 TIMES DAILY.  0   No current facility-administered medications on file prior to visit.    Review of Systems  Constitutional: Negative for other unusual diaphoresis or sweats HENT: Negative for ear discharge or swelling Eyes: Negative for other worsening visual disturbances Respiratory: Negative for stridor or other swelling  Gastrointestinal: Negative for worsening distension or other blood Genitourinary: Negative for retention or other urinary change Musculoskeletal: Negative for other MSK pain or swelling Skin: Negative for color change or other new lesions Neurological: Negative for worsening tremors and other numbness  Psychiatric/Behavioral: Negative for worsening agitation or other fatigue All other system neg per pt    Objective:   Physical Exam BP 126/86   Pulse 98   Ht 5\' 5"  (1.651  m)   Wt 276 lb (125.2 kg)   SpO2 100%   BMI 45.93 kg/m  VS noted,  Constitutional: Pt appears in NAD HENT: Head: NCAT.  Right Ear: External ear normal.  Left Ear: External ear normal.  Eyes: . Pupils are equal, round, and reactive to light. Conjunctivae and EOM are normal Nose: without d/c or deformity Neck: Neck supple. Gross normal ROM Cardiovascular: Normal rate and regular rhythm.   Pulmonary/Chest: Effort normal and breath sounds without rales or wheezing.  Bilat dorsalis pedis 1+ bilat Neurological: Pt is alert. At baseline orientation, motor grossly intact Skin: Skin is warm. No rashes, other new lesions, no LE edema Psychiatric: Pt behavior is normal without agitation  No other exam findings  Lab Results  Component Value Date   WBC 5.8 03/25/2016   HGB 13.3 03/25/2016   HCT 39.9 03/25/2016  PLT 261.0 03/25/2016   GLUCOSE 283 (H) 03/25/2016   CHOL 185 03/25/2016   TRIG 80.0 03/25/2016   HDL 51.00 03/25/2016   LDLCALC 118 (H) 03/25/2016   ALT 23 03/25/2016   AST 14 03/25/2016   NA 134 (L) 03/25/2016   K 3.9 03/25/2016   CL 98 03/25/2016   CREATININE 0.74 03/25/2016   BUN 14 03/25/2016   CO2 30 03/25/2016   TSH 2.16 02/05/2016   HGBA1C 10.6 Repeated and verified X2. (H) 03/25/2016   MICROALBUR 0.7 02/05/2016        Assessment & Plan:

## 2016-10-29 NOTE — Assessment & Plan Note (Signed)
With hx of poor control, I encouraged pt to f/u with PCP regarding better control and urged compliance with meds

## 2016-11-01 ENCOUNTER — Other Ambulatory Visit: Payer: Self-pay | Admitting: Family

## 2016-11-04 ENCOUNTER — Encounter: Payer: Self-pay | Admitting: Internal Medicine

## 2016-11-04 MED ORDER — BLOOD GLUCOSE METER KIT: PACK | 0 refills | Status: DC

## 2016-11-04 NOTE — Telephone Encounter (Signed)
Spoke with pt, she would like meter sent to [facility]. RX faxed.

## 2016-11-12 ENCOUNTER — Telehealth: Payer: Self-pay | Admitting: Internal Medicine

## 2016-11-12 DIAGNOSIS — I739 Peripheral vascular disease, unspecified: Secondary | ICD-10-CM

## 2016-11-12 NOTE — Telephone Encounter (Signed)
Spoke with pt to inform. Pt states she only has some tightness and pain from time to time but not like it was before. Do you feel she should still go to a Vas specialist or hold off? She feels it may be related to the neuropathy.

## 2016-11-12 NOTE — Telephone Encounter (Signed)
Lets hold off and monitor - vascular will not be able to do anything.

## 2016-11-12 NOTE — Telephone Encounter (Signed)
Let her know the vascular study she had on her leg shows mild arterial disease.  If she is still having symptoms in her right leg I can refer her to a vascular specialist.  They may not do anything because the disease is mild.

## 2016-11-12 NOTE — Telephone Encounter (Signed)
Pt is aware I would only contact her if needing the referral. She is okay with that.

## 2016-11-24 ENCOUNTER — Other Ambulatory Visit: Payer: Self-pay | Admitting: Internal Medicine

## 2016-11-25 DIAGNOSIS — M79604 Pain in right leg: Secondary | ICD-10-CM | POA: Diagnosis not present

## 2016-11-25 DIAGNOSIS — M5417 Radiculopathy, lumbosacral region: Secondary | ICD-10-CM | POA: Diagnosis not present

## 2016-11-25 DIAGNOSIS — M79605 Pain in left leg: Secondary | ICD-10-CM | POA: Diagnosis not present

## 2016-11-25 DIAGNOSIS — G4733 Obstructive sleep apnea (adult) (pediatric): Secondary | ICD-10-CM | POA: Diagnosis not present

## 2016-11-30 ENCOUNTER — Ambulatory Visit (HOSPITAL_COMMUNITY)
Admission: RE | Admit: 2016-11-30 | Payer: Medicare Other | Source: Ambulatory Visit | Attending: Internal Medicine | Admitting: Internal Medicine

## 2016-11-30 ENCOUNTER — Telehealth: Payer: Self-pay | Admitting: *Deleted

## 2016-11-30 NOTE — Telephone Encounter (Signed)
Patient requested to reschedule lower arterial doppler study scheduled for 11/30/16 @ 1 noon.  Left message for patient to call.

## 2016-12-07 DIAGNOSIS — G894 Chronic pain syndrome: Secondary | ICD-10-CM | POA: Diagnosis not present

## 2016-12-07 DIAGNOSIS — Z79891 Long term (current) use of opiate analgesic: Secondary | ICD-10-CM | POA: Diagnosis not present

## 2016-12-07 DIAGNOSIS — M25561 Pain in right knee: Secondary | ICD-10-CM | POA: Diagnosis not present

## 2016-12-07 DIAGNOSIS — M25562 Pain in left knee: Secondary | ICD-10-CM | POA: Diagnosis not present

## 2016-12-07 DIAGNOSIS — M545 Low back pain: Secondary | ICD-10-CM | POA: Diagnosis not present

## 2016-12-09 ENCOUNTER — Telehealth: Payer: Self-pay | Admitting: Internal Medicine

## 2016-12-09 NOTE — Telephone Encounter (Signed)
She is due for routine follow up with me - please call her to schedule.    She also had a stool test at home that showed blood.  She is overdue for a colonoscopy and should have one - I can refer her if she wants.

## 2016-12-10 NOTE — Telephone Encounter (Signed)
Called pt gave MD response. Made appt for 01/08/17 for f/u per pt request, also she states that she have hemorrhoids and she will let MD know when she is ready to have the colonoscopy...[name]/lmb

## 2016-12-10 NOTE — Telephone Encounter (Signed)
noted 

## 2016-12-23 ENCOUNTER — Ambulatory Visit (HOSPITAL_COMMUNITY)
Admission: RE | Admit: 2016-12-23 | Discharge: 2016-12-23 | Disposition: A | Discharge status: Home / Self Care | Payer: Medicare Other | Source: Ambulatory Visit | Attending: Cardiology | Admitting: Cardiology

## 2016-12-23 DIAGNOSIS — I739 Peripheral vascular disease, unspecified: Secondary | ICD-10-CM | POA: Insufficient documentation

## 2016-12-25 ENCOUNTER — Encounter: Payer: Self-pay | Admitting: Internal Medicine

## 2017-01-07 NOTE — Progress Notes (Signed)
Subjective:    Patient ID: Lindsay Hardy, female    DOB: 02/02/1951, 66 y.o.   MRN: [id number]  HPI  Patient Active Problem List   Diagnosis Date Noted  . PAD (peripheral artery disease) ([place]) 10/29/2016  . Chronic back pain 09/23/2016  . Diabetic peripheral neuropathy ([place]) 02/05/2016  . Dysuria 01/14/2016  . Low back pain 05/09/2014  . Sinusitis 08/19/2006  . 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 Hyperlipidemia 06/17/2006  . Essential hypertension 06/17/2006  . CORONARY ARTERY DISEASE 06/17/2006  . DIASTOLIC DYSFUNCTION 44/06/270  . DISEASE, CEREBROVASCULAR NEC 06/17/2006  . Osteoarthritis of left knee 06/17/2006    Current Outpatient Prescriptions on File Prior to Visit  Medication Sig Dispense Refill  . albuterol (PROAIR HFA) 108 (90 Base) MCG/ACT inhaler Inhale 2 puffs into the lungs every 4 (four) hours as needed for shortness of breath. 1 Inhaler 2  . amLODipine (NORVASC) 10 MG tablet Take 1 tablet (10 mg total) by mouth daily. 90 tablet 1  . aspirin 81 MG EC tablet TAKE 1 TABLET (81 MG TOTAL) BY MOUTH DAILY. 90 tablet 1  . Blood Glucose Monitoring Suppl (ONETOUCH VERIO IQ SYSTEM) w/Device KIT Use to check blood sugar once to twice daily. 1 kit 0  . fluticasone (FLONASE) 50 MCG/ACT nasal spray Place 2 sprays into both nostrils daily. 16 g 6  . gabapentin (NEURONTIN) 300 MG capsule Take 1 capsule (300 mg total) by mouth 2 (two) times daily. 60 capsule 2  . glipiZIDE (GLUCOTROL) 10 MG tablet TAKE 1 TABLET BY MOUTH TWICE A DAY BEFORE A MEAL 180 tablet 1  . hydrochlorothiazide (HYDRODIURIL) 25 MG tablet Take one tab daily as needed for leg edema  -- Office visit needed for further refills 90 tablet 0  . HYDROcodone-acetaminophen (NORCO) 10-325 MG tablet Take 1 tablet by mouth every 6 (six) hours.  0  . ibuprofen (ADVIL,MOTRIN) 400 MG tablet TAKE 1 TABLET 3 TIMES DAILY.  0  . levofloxacin (LEVAQUIN) 500 MG tablet Take 1 tablet  (500 mg total) by mouth daily. 7 tablet 0  . linaclotide (LINZESS) 145 MCG CAPS capsule Take 1 capsule (145 mcg total) by mouth every morning. 30 capsule 5  . metFORMIN (GLUCOPHAGE-XR) 500 MG 24 hr tablet Take 2 tablets (1,000 mg total) by mouth daily with breakfast. 180 tablet 3  . montelukast (SINGULAIR) 10 MG tablet TAKE 1 TABLET BY MOUTH AT BEDTIME 90 tablet 0  . potassium chloride (MICRO-K) 10 MEQ CR capsule TAKE ONE CAPSULE BY MOUTH TWICE A DAY 180 capsule 3  . VOLTAREN 1 % GEL APPLY 2 GRAMS 3 TIMES DAILY.  0   No current facility-administered medications on file prior to visit.     Past Medical History:  Diagnosis Date  . Asthma   . CAD (coronary artery disease) 11/2003    RCA 60% stenosis, LAD diffuse  . Chicken pox   . CVA (cerebrovascular accident) [facility]) 2001    right frontoparietal cortical CVA - MRI September 2001  . Diabetes mellitus   . Diastolic dysfunction     ejection fraction 55-65% on 2-D echo May 2006  . Empty sella syndrome [facility])     based on MRI September 2001, related to obesity, hypertension, it was determined that patient has not required treatment but will be monitored with TSH, ACTH, cortisol, testosterone, prolactin, growth hormone  . GERD (gastroesophageal reflux disease)     EGD June 2004 -  followed by Dr. Carlean Purl  .  Granulomatous disease, chronic (HCC)     in right upper lobe per x-ray December 2005  . Hyperlipidemia   . Hypertension   . NAFLD (nonalcoholic fatty liver disease)     based on ultrasound June 2002, elevated transaminase  . OA (osteoarthritis)     DJP coracoclavicular ligament, left patella osteophytes June 2002, L4-S1 spondylosis, degenerative disc disease with bulge. I in October 2001, the C2-C4 spondylosis without stenosis and with spurs based on x-ray July 2005; diffuse idiopathic skeletal hyperostosis with bilateral hip lumbar degenerative changes   on x-ray February 2005, bilateral plantar calcaneal spurs  . Reactive airway disease       peak flow 180 in May 2005, chronic sinusitis with PND, bronchitis December 2002 and strep pneumonia April 2007,  FEC 66, MCV 60, ratio is 69, DLCO 57,  moderate restrictive and mild obstructive disease  on spirometry May 2005    Past Surgical History:  Procedure Laterality Date  . CARPAL TUNNEL RELEASE  1992    left arm 1992  . CHOLECYSTECTOMY  2003  . TONSILLECTOMY AND ADENOIDECTOMY      Social History   Social History  . Marital status: Single    Spouse name: N/A  . Number of children: 1  . Years of education: 44   Occupational History  .  fromer nursing assistant    Social History Main Topics  . Smoking status: Former Smoker    Packs/day: 0.25    Years: 4.00    Types: Cigarettes  . Smokeless tobacco: Never Used     Comment: smoked in 20's   . Alcohol use No  . Drug use: No  . Sexual activity: Not Currently    Birth control/ protection: Post-menopausal   Other Topics Concern  . Not on file   Social History Narrative   Born and raised in [place], [place]. Currently lives in a private residence with her nephew and his wife. Fun: Church activities, spending time with family   Denies any religious beliefs that would effect health care.     Family History  Problem Relation Age of Onset  . Lung cancer Mother   . Hypertension Mother   . Lung cancer Sister   . Hypertension Sister     Review of Systems     Objective:  There were no vitals filed for this visit. Wt Readings from Last 3 Encounters:  10/29/16 276 lb (125.2 kg)  09/23/16 274 lb (124.3 kg)  07/28/16 273 lb (123.8 kg)   There is no height or weight on file to calculate BMI.   Physical Exam         Assessment & Plan:    See Problem List for Assessment and Plan of chronic medical problems.   This encounter was created in error - please disregard.

## 2017-01-08 ENCOUNTER — Encounter: Payer: Medicare Other | Admitting: Internal Medicine

## 2017-01-08 DIAGNOSIS — M545 Low back pain: Secondary | ICD-10-CM | POA: Diagnosis not present

## 2017-01-08 DIAGNOSIS — Z79891 Long term (current) use of opiate analgesic: Secondary | ICD-10-CM | POA: Diagnosis not present

## 2017-01-08 DIAGNOSIS — M25569 Pain in unspecified knee: Secondary | ICD-10-CM | POA: Diagnosis not present

## 2017-01-08 DIAGNOSIS — G894 Chronic pain syndrome: Secondary | ICD-10-CM | POA: Diagnosis not present

## 2017-01-21 ENCOUNTER — Other Ambulatory Visit: Payer: Self-pay | Admitting: Internal Medicine

## 2017-02-07 ENCOUNTER — Other Ambulatory Visit: Payer: Self-pay | Admitting: Internal Medicine

## 2017-02-15 ENCOUNTER — Other Ambulatory Visit: Payer: Self-pay | Admitting: Family

## 2017-02-15 DIAGNOSIS — M545 Low back pain: Secondary | ICD-10-CM | POA: Diagnosis not present

## 2017-02-15 DIAGNOSIS — M25569 Pain in unspecified knee: Secondary | ICD-10-CM | POA: Diagnosis not present

## 2017-02-15 DIAGNOSIS — G894 Chronic pain syndrome: Secondary | ICD-10-CM | POA: Diagnosis not present

## 2017-02-15 DIAGNOSIS — Z79891 Long term (current) use of opiate analgesic: Secondary | ICD-10-CM | POA: Diagnosis not present

## 2017-02-17 ENCOUNTER — Emergency Department (HOSPITAL_COMMUNITY)
Admission: EM | Admit: 2017-02-17 | Discharge: 2017-02-17 | Disposition: A | Discharge status: Home / Self Care | Payer: No Typology Code available for payment source | Attending: Emergency Medicine | Admitting: Emergency Medicine

## 2017-02-17 ENCOUNTER — Encounter (HOSPITAL_COMMUNITY): Payer: Self-pay | Admitting: Emergency Medicine

## 2017-02-17 ENCOUNTER — Other Ambulatory Visit: Payer: Self-pay | Admitting: *Deleted

## 2017-02-17 ENCOUNTER — Encounter: Payer: Self-pay | Admitting: *Deleted

## 2017-02-17 DIAGNOSIS — Y929 Unspecified place or not applicable: Secondary | ICD-10-CM | POA: Diagnosis not present

## 2017-02-17 DIAGNOSIS — Z7982 Long term (current) use of aspirin: Secondary | ICD-10-CM | POA: Diagnosis not present

## 2017-02-17 DIAGNOSIS — I2510 Atherosclerotic heart disease of native coronary artery without angina pectoris: Secondary | ICD-10-CM | POA: Insufficient documentation

## 2017-02-17 DIAGNOSIS — M542 Cervicalgia: Secondary | ICD-10-CM | POA: Diagnosis not present

## 2017-02-17 DIAGNOSIS — E1143 Type 2 diabetes mellitus with diabetic autonomic (poly)neuropathy: Secondary | ICD-10-CM | POA: Diagnosis not present

## 2017-02-17 DIAGNOSIS — I5032 Chronic diastolic (congestive) heart failure: Secondary | ICD-10-CM | POA: Insufficient documentation

## 2017-02-17 DIAGNOSIS — Z7984 Long term (current) use of oral hypoglycemic drugs: Secondary | ICD-10-CM | POA: Insufficient documentation

## 2017-02-17 DIAGNOSIS — S39012A Strain of muscle, fascia and tendon of lower back, initial encounter: Secondary | ICD-10-CM | POA: Diagnosis not present

## 2017-02-17 DIAGNOSIS — Y939 Activity, unspecified: Secondary | ICD-10-CM | POA: Insufficient documentation

## 2017-02-17 DIAGNOSIS — Y999 Unspecified external cause status: Secondary | ICD-10-CM | POA: Diagnosis not present

## 2017-02-17 DIAGNOSIS — T148XXA Other injury of unspecified body region, initial encounter: Secondary | ICD-10-CM

## 2017-02-17 DIAGNOSIS — M545 Low back pain, unspecified: Secondary | ICD-10-CM

## 2017-02-17 DIAGNOSIS — Z87891 Personal history of nicotine dependence: Secondary | ICD-10-CM | POA: Insufficient documentation

## 2017-02-17 DIAGNOSIS — I110 Hypertensive heart disease with heart failure: Secondary | ICD-10-CM | POA: Diagnosis not present

## 2017-02-17 MED ORDER — METHOCARBAMOL 500 MG PO TABS: 500.0000 mg | ORAL_TABLET | Freq: Two times a day (BID) | ORAL | 0 refills | Status: DC

## 2017-02-17 NOTE — Discharge Instructions (Signed)
The pain your experiencing is likely due to muscle strain, you may take your home pain medication as well as  Robaxin as needed for pain management. The muscle soreness should improve over the next week. Follow up with your family doctor in the next week for a recheck if you are still having symptoms. Return to ED if pain is worsening, you develop weakness or numbness of extremities, or new or concerning symptoms develop.
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
## 2017-02-22 DIAGNOSIS — M791 Myalgia: Secondary | ICD-10-CM | POA: Diagnosis not present

## 2017-02-26 DIAGNOSIS — R2689 Other abnormalities of gait and mobility: Secondary | ICD-10-CM | POA: Diagnosis not present

## 2017-02-26 DIAGNOSIS — M545 Low back pain: Secondary | ICD-10-CM | POA: Diagnosis not present

## 2017-03-02 DIAGNOSIS — R2689 Other abnormalities of gait and mobility: Secondary | ICD-10-CM | POA: Diagnosis not present

## 2017-03-02 DIAGNOSIS — M545 Low back pain: Secondary | ICD-10-CM | POA: Diagnosis not present

## 2017-03-04 DIAGNOSIS — M545 Low back pain: Secondary | ICD-10-CM | POA: Diagnosis not present

## 2017-03-04 DIAGNOSIS — R2689 Other abnormalities of gait and mobility: Secondary | ICD-10-CM | POA: Diagnosis not present

## 2017-03-09 DIAGNOSIS — G894 Chronic pain syndrome: Secondary | ICD-10-CM | POA: Diagnosis not present

## 2017-03-09 DIAGNOSIS — M25569 Pain in unspecified knee: Secondary | ICD-10-CM | POA: Diagnosis not present

## 2017-03-09 DIAGNOSIS — M545 Low back pain: Secondary | ICD-10-CM | POA: Diagnosis not present

## 2017-03-09 DIAGNOSIS — Z79891 Long term (current) use of opiate analgesic: Secondary | ICD-10-CM | POA: Diagnosis not present

## 2017-03-12 DIAGNOSIS — M545 Low back pain: Secondary | ICD-10-CM | POA: Diagnosis not present

## 2017-03-12 DIAGNOSIS — R2689 Other abnormalities of gait and mobility: Secondary | ICD-10-CM | POA: Diagnosis not present

## 2017-03-19 DIAGNOSIS — M545 Low back pain: Secondary | ICD-10-CM | POA: Diagnosis not present

## 2017-03-19 DIAGNOSIS — R2689 Other abnormalities of gait and mobility: Secondary | ICD-10-CM | POA: Diagnosis not present

## 2017-03-24 ENCOUNTER — Other Ambulatory Visit: Payer: Self-pay | Admitting: Internal Medicine

## 2017-04-01 ENCOUNTER — Other Ambulatory Visit (INDEPENDENT_AMBULATORY_CARE_PROVIDER_SITE_OTHER): Payer: Medicare Other

## 2017-04-01 ENCOUNTER — Encounter: Payer: Self-pay | Admitting: Internal Medicine

## 2017-04-01 ENCOUNTER — Ambulatory Visit (INDEPENDENT_AMBULATORY_CARE_PROVIDER_SITE_OTHER): Payer: Medicare Other | Admitting: Internal Medicine

## 2017-04-01 VITALS — BP 154/90 | HR 106 | Temp 98.3°F | Resp 18 | Wt 284.0 lb

## 2017-04-01 DIAGNOSIS — E1142 Type 2 diabetes mellitus with diabetic polyneuropathy: Secondary | ICD-10-CM

## 2017-04-01 DIAGNOSIS — E1143 Type 2 diabetes mellitus with diabetic autonomic (poly)neuropathy: Secondary | ICD-10-CM | POA: Diagnosis not present

## 2017-04-01 DIAGNOSIS — M545 Low back pain, unspecified: Secondary | ICD-10-CM

## 2017-04-01 DIAGNOSIS — I10 Essential (primary) hypertension: Secondary | ICD-10-CM

## 2017-04-01 DIAGNOSIS — M5442 Lumbago with sciatica, left side: Secondary | ICD-10-CM | POA: Diagnosis not present

## 2017-04-01 DIAGNOSIS — M5441 Lumbago with sciatica, right side: Secondary | ICD-10-CM | POA: Diagnosis not present

## 2017-04-01 DIAGNOSIS — G8929 Other chronic pain: Secondary | ICD-10-CM

## 2017-04-01 LAB — CBC WITH DIFFERENTIAL/PLATELET
BASOS ABS: 0 10*3/uL (ref 0.0–0.1)
Basophils Relative: 1 % (ref 0.0–3.0)
EOS ABS: 0 10*3/uL (ref 0.0–0.7)
Eosinophils Relative: 0.9 % (ref 0.0–5.0)
HCT: 44 % (ref 36.0–46.0)
Hemoglobin: 13.9 g/dL (ref 12.0–15.0)
LYMPHS ABS: 1.4 10*3/uL (ref 0.7–4.0)
Lymphocytes Relative: 27.8 % (ref 12.0–46.0)
MCHC: 31.7 g/dL (ref 30.0–36.0)
MCV: 80.9 fl (ref 78.0–100.0)
MONO ABS: 0.5 10*3/uL (ref 0.1–1.0)
Monocytes Relative: 10.9 % (ref 3.0–12.0)
NEUTROS ABS: 3 10*3/uL (ref 1.4–7.7)
NEUTROS PCT: 59.4 % (ref 43.0–77.0)
Platelets: 252 10*3/uL (ref 150.0–400.0)
RBC: 5.44 Mil/uL — AB (ref 3.87–5.11)
RDW: 15.8 % — ABNORMAL HIGH (ref 11.5–15.5)
WBC: 5 10*3/uL (ref 4.0–10.5)

## 2017-04-01 LAB — COMPREHENSIVE METABOLIC PANEL
ALK PHOS: 103 U/L (ref 39–117)
ALT: 43 U/L — AB (ref 0–35)
AST: 34 U/L (ref 0–37)
Albumin: 3.9 g/dL (ref 3.5–5.2)
BILIRUBIN TOTAL: 0.4 mg/dL (ref 0.2–1.2)
BUN: 10 mg/dL (ref 6–23)
CO2: 30 mEq/L (ref 19–32)
CREATININE: 0.79 mg/dL (ref 0.40–1.20)
Calcium: 9.4 mg/dL (ref 8.4–10.5)
Chloride: 98 mEq/L (ref 96–112)
GFR: 93.12 mL/min (ref >60.00)
GLUCOSE: 362 mg/dL — AB (ref 70–99)
Potassium: 3.6 mEq/L (ref 3.5–5.1)
Sodium: 136 mEq/L (ref 135–145)
TOTAL PROTEIN: 7.4 g/dL (ref 6.0–8.3)

## 2017-04-01 LAB — TSH: TSH: 1.62 u[IU]/mL (ref 0.35–4.50)

## 2017-04-01 LAB — HEMOGLOBIN A1C: Hgb A1c MFr Bld: 13.1 % — ABNORMAL HIGH (ref 4.6–6.5)

## 2017-04-01 LAB — VITAMIN B12: Vitamin B-12: 267 pg/mL (ref 211–911)

## 2017-04-01 MED ORDER — NEBIVOLOL HCL 5 MG PO TABS: 5.0000 mg | ORAL_TABLET | Freq: Every day | ORAL | 5 refills | Status: DC

## 2017-04-01 NOTE — Assessment & Plan Note (Signed)
Discussed options for neuropathy including gabapentin, Lyrica and Cymbalta She is decided to stay with the gabapentin We will check vitamin B12 level Stressed the importance of getting her sugars better controlled

## 2017-04-01 NOTE — Assessment & Plan Note (Signed)
Not controlled Continue amlodipine 10 mg daily Continue hctz 25 mg daily Start bystolic 5 mg daily if covered by insurance  Follow up in one month to recheck Stressed healthy eating and weight loss

## 2017-04-01 NOTE — Assessment & Plan Note (Signed)
Stressed the importance of weight loss Discussed the importance of a healthy diet, decreased portions and regular exercise We will refer to the medical weight management clinic

## 2017-04-01 NOTE — Progress Notes (Signed)
Subjective:    Patient ID: Lindsay Hardy, female    DOB: [birth year], 66 y.o.   MRN: [id number]  HPI She is here for an acute visit.   Hypertension:  She is doing PT for her back and her BP has been high there for the past 4 visits.  It has been 170/110, 157/111, 170/110.  She is taking her medication daily.    Diabetes:  Not checking sugars.  She states she is taking her medication as prescribed.  She was last seen here 1 year ago.  She did not follow-up as scheduled.  She is not exercising.  She states she is fairly compliant with a low sugar/carbohydrate diet.  She is not checking her sugars at home.  Peripheral neuropathy, diabetic: She is taking the gabapentin, but states it does not help much.  She would rather not be on it.  She wonders if there is anything else that she can take.  Chronic lower back pain: She is currently doing physical therapy for her back.  She states the pain is severe and nothing helps with the pain.  She is following with pain management.  Medications and allergies reviewed with patient and updated if appropriate.  Patient Active Problem List   Diagnosis Date Noted  . PAD (peripheral artery disease) ([place]) 10/29/2016  . Chronic back pain 09/23/2016  . Diabetic peripheral neuropathy ([place]) 02/05/2016  . Dysuria 01/14/2016  . Low back pain 05/09/2014  . Sinusitis 08/19/2006  . 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 Hyperlipidemia 06/17/2006  . Essential hypertension 06/17/2006  . Coronary atherosclerosis 06/17/2006  . DIASTOLIC DYSFUNCTION 44/96/7591  . DISEASE, CEREBROVASCULAR NEC 06/17/2006  . Osteoarthritis of left knee 06/17/2006    Current Outpatient Prescriptions on File Prior to Visit  Medication Sig Dispense Refill  . albuterol (PROAIR HFA) 108 (90 Base) MCG/ACT inhaler Inhale 2 puffs into the lungs every 4 (four) hours as needed for shortness of breath. 1 Inhaler 2  . amLODipine (NORVASC) 10 MG  tablet Take 1 tablet (10 mg total) by mouth daily. 90 tablet 1  . amLODipine (NORVASC) 10 MG tablet TAKE 1 TABLET BY MOUTH EVERY DAY 90 tablet 2  . aspirin 81 MG EC tablet TAKE 1 TABLET (81 MG TOTAL) BY MOUTH DAILY. 90 tablet 1  . Blood Glucose Monitoring Suppl (ONETOUCH VERIO IQ SYSTEM) w/Device KIT Use to check blood sugar once to twice daily. 1 kit 0  . fluticasone (FLONASE) 50 MCG/ACT nasal spray Place 2 sprays into both nostrils daily. 16 g 6  . gabapentin (NEURONTIN) 300 MG capsule Take 1 capsule (300 mg total) by mouth 2 (two) times daily. 60 capsule 2  . glipiZIDE (GLUCOTROL) 10 MG tablet TAKE 1 TABLET BY MOUTH TWICE A DAY BEFORE A MEAL 180 tablet 2  . hydrochlorothiazide (HYDRODIURIL) 25 MG tablet Take 1 tablet (25 mg total) by mouth daily. 90 tablet 1  . HYDROcodone-acetaminophen (NORCO) 10-325 MG tablet Take 1 tablet by mouth every 6 (six) hours.  0  . ibuprofen (ADVIL,MOTRIN) 400 MG tablet TAKE 1 TABLET 3 TIMES DAILY.  0  . LINZESS 145 MCG CAPS capsule TAKE ONE CAPSULE BY MOUTH EVERY MORNING 30 capsule 3  . metFORMIN (GLUCOPHAGE-XR) 500 MG 24 hr tablet TAKE 2 TABLETS BY MOUTH EVERY DAY WITH BREAKFAST 180 tablet 2  . methocarbamol (ROBAXIN) 500 MG tablet Take 1 tablet (500 mg total) by mouth 2 (two) times daily. 20 tablet 0  . montelukast (SINGULAIR)  10 MG tablet TAKE 1 TABLET BY MOUTH AT BEDTIME 90 tablet 0  . ONETOUCH VERIO test strip Use to check blood sugar one to two times daily. 300 each 1  . potassium chloride (MICRO-K) 10 MEQ CR capsule TAKE ONE CAPSULE BY MOUTH TWICE A DAY 180 capsule 3  . VOLTAREN 1 % GEL APPLY 2 GRAMS 3 TIMES DAILY.  0   No current facility-administered medications on file prior to visit.     Past Medical History:  Diagnosis Date  . Asthma   . CAD (coronary artery disease) 11/2003    RCA 60% stenosis, LAD diffuse  . Chicken pox   . CVA (cerebrovascular accident) [facility]) 2001    right frontoparietal cortical CVA - MRI September 2001  . Diabetes  mellitus   . Diastolic dysfunction     ejection fraction 55-65% on 2-D echo May 2006  . Empty sella syndrome [facility])     based on MRI September 2001, related to obesity, hypertension, it was determined that patient has not required treatment but will be monitored with TSH, ACTH, cortisol, testosterone, prolactin, growth hormone  . GERD (gastroesophageal reflux disease)     EGD June 2004 -  followed by Dr. Carlean Purl  . Granulomatous disease, chronic (HCC)     in right upper lobe per x-ray December 2005  . Hyperlipidemia   . Hypertension   . NAFLD (nonalcoholic fatty liver disease)     based on ultrasound June 2002, elevated transaminase  . OA (osteoarthritis)     DJP coracoclavicular ligament, left patella osteophytes June 2002, L4-S1 spondylosis, degenerative disc disease with bulge. I in October 2001, the C2-C4 spondylosis without stenosis and with spurs based on x-ray July 2005; diffuse idiopathic skeletal hyperostosis with bilateral hip lumbar degenerative changes   on x-ray February 2005, bilateral plantar calcaneal spurs  . Reactive airway disease     peak flow 180 in May 2005, chronic sinusitis with PND, bronchitis December 2002 and strep pneumonia April 2007,  FEC 66, MCV 60, ratio is 69, DLCO 57,  moderate restrictive and mild obstructive disease  on spirometry May 2005    Past Surgical History:  Procedure Laterality Date  . CARPAL TUNNEL RELEASE  1992    left arm 1992  . CHOLECYSTECTOMY  2003  . TONSILLECTOMY AND ADENOIDECTOMY      Social History   Social History  . Marital status: Single    Spouse name: N/A  . Number of children: 1  . Years of education: 66   Occupational History  .  fromer nursing assistant    Social History Main Topics  . Smoking status: Former Smoker    Packs/day: 0.25    Years: 4.00    Types: Cigarettes  . Smokeless tobacco: Never Used     Comment: smoked in 20's   . Alcohol use No  . Drug use: No  . Sexual activity: Not Currently    Birth  control/ protection: Post-menopausal   Other Topics Concern  . Not on file   Social History Narrative   Born and raised in [place], [place]. Currently lives in a private residence with her nephew and his wife. Fun: Church activities, spending time with family   Denies any religious beliefs that would effect health care.     Family History  Problem Relation Age of Onset  . Lung cancer Mother   . Hypertension Mother   . Lung cancer Sister   . Hypertension Sister     Review of Systems  Constitutional: Negative for fever.  Respiratory: Positive for shortness of breath (with humidity, with exertion) and wheezing (at times). Negative for cough.   Cardiovascular: Positive for palpitations. Negative for chest pain and leg swelling.  Neurological: Positive for headaches (sinus and on left side of head -intermittent).       Objective:   Vitals:   04/01/17 1128  BP: (!) 154/90  Pulse: (!) 106  Resp: 18  Temp: 98.3 F (36.8 C)  SpO2: 93%   Filed Weights   04/01/17 1128  Weight: 284 lb (128.8 kg)   Body mass index is 47.26 kg/m.  Wt Readings from Last 3 Encounters:  04/01/17 284 lb (128.8 kg)  10/29/16 276 lb (125.2 kg)  09/23/16 274 lb (124.3 kg)     Physical Exam Constitutional: Appears well-developed and well-nourished. No distress.  HENT:  Head: Normocephalic and atraumatic.  Neck: Neck supple. No tracheal deviation present. No thyromegaly present.  No cervical lymphadenopathy Cardiovascular: Normal rate, regular rhythm and normal heart sounds.   No murmur heard. No carotid bruit .  No edema Pulmonary/Chest: Effort normal and breath sounds normal. No respiratory distress. No has no wheezes. No rales.  Skin: Skin is warm and dry. Not diaphoretic.  Psychiatric: Normal mood and affect. Behavior is normal.         Assessment & Plan:   See Problem List for Assessment and Plan of chronic medical problems.  Follow-up in 1 month

## 2017-04-01 NOTE — Assessment & Plan Note (Signed)
Doing physical therapy Following with pain management

## 2017-04-01 NOTE — Patient Instructions (Signed)
  Test(s) ordered today. Your results will be released to MyChart (or called to you) after review, usually within 72hours after test completion. If any changes need to be made, you will be notified at that same time.   Medications reviewed and updated.  Changes include starting bystolic for your BP.    Your prescription(s) have been submitted to your pharmacy. Please take as directed and contact our office if you believe you are having problem(s) with the medication(s).  A referral was ordered for endocrine and weight loss clinic.   Please followup in 1 month

## 2017-04-01 NOTE — Assessment & Plan Note (Signed)
Uncontrolled She has not followed up over the last year for her routine appointments She is not checking her sugars at home We will check A1c today Continue current medications for now and will adjust Sugars are likely well controlled, will refer her to endocrine at this time Follow-up in 1 month

## 2017-04-05 DIAGNOSIS — M545 Low back pain: Secondary | ICD-10-CM | POA: Diagnosis not present

## 2017-04-05 DIAGNOSIS — G894 Chronic pain syndrome: Secondary | ICD-10-CM | POA: Diagnosis not present

## 2017-04-05 DIAGNOSIS — M25569 Pain in unspecified knee: Secondary | ICD-10-CM | POA: Diagnosis not present

## 2017-04-05 DIAGNOSIS — Z79891 Long term (current) use of opiate analgesic: Secondary | ICD-10-CM | POA: Diagnosis not present

## 2017-04-08 DIAGNOSIS — M545 Low back pain: Secondary | ICD-10-CM | POA: Diagnosis not present

## 2017-04-08 DIAGNOSIS — R2689 Other abnormalities of gait and mobility: Secondary | ICD-10-CM | POA: Diagnosis not present

## 2017-04-13 ENCOUNTER — Telehealth: Payer: Self-pay | Admitting: Emergency Medicine

## 2017-04-13 DIAGNOSIS — M549 Dorsalgia, unspecified: Secondary | ICD-10-CM

## 2017-04-13 DIAGNOSIS — R2689 Other abnormalities of gait and mobility: Secondary | ICD-10-CM | POA: Diagnosis not present

## 2017-04-13 DIAGNOSIS — G8929 Other chronic pain: Secondary | ICD-10-CM

## 2017-04-13 DIAGNOSIS — M545 Low back pain: Secondary | ICD-10-CM | POA: Diagnosis not present

## 2017-04-13 DIAGNOSIS — G6289 Other specified polyneuropathies: Secondary | ICD-10-CM

## 2017-04-13 NOTE — Telephone Encounter (Signed)
Spoke with Rep from [employer] and was unsuccessful with ordering the Heating pad that was requested from the pt.   The number that was given by the pt was [phone]  LVM for pt to call back and discuss.

## 2017-04-14 NOTE — Telephone Encounter (Signed)
Orders faxed to [facility].  

## 2017-04-14 NOTE — Telephone Encounter (Signed)
Spoke with pt advising I was unable to order through [employer]. [facility]

## 2017-04-14 NOTE — Telephone Encounter (Signed)
Patient called back. She has been informed of the note and states she will wait for you to call back and discuss.

## 2017-04-14 NOTE — Telephone Encounter (Signed)
signed

## 2017-04-16 DIAGNOSIS — R2689 Other abnormalities of gait and mobility: Secondary | ICD-10-CM | POA: Diagnosis not present

## 2017-04-16 DIAGNOSIS — M545 Low back pain: Secondary | ICD-10-CM | POA: Diagnosis not present

## 2017-04-20 DIAGNOSIS — M545 Low back pain: Secondary | ICD-10-CM | POA: Diagnosis not present

## 2017-04-20 DIAGNOSIS — R2689 Other abnormalities of gait and mobility: Secondary | ICD-10-CM | POA: Diagnosis not present

## 2017-04-24 NOTE — Progress Notes (Signed)
Subjective:    Patient ID: Lindsay Hardy, female    DOB: 17-Jan-1951, 66 y.o.   MRN: [id number]  HPI  Medications and allergies reviewed with patient and updated if appropriate.  Patient Active Problem List   Diagnosis Date Noted  . Morbid obesity ([place]) 04/01/2017  . PAD (peripheral artery disease) ([place]) 10/29/2016  . Chronic back pain 09/23/2016  . Diabetic peripheral neuropathy ([place]) 02/05/2016  . 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 Hyperlipidemia 06/17/2006  . Essential hypertension 06/17/2006  . Coronary atherosclerosis 06/17/2006  . DIASTOLIC DYSFUNCTION 58/52/7782  . DISEASE, CEREBROVASCULAR NEC 06/17/2006  . Osteoarthritis of left knee 06/17/2006    Current Outpatient Medications on File Prior to Visit  Medication Sig Dispense Refill  . albuterol (PROAIR HFA) 108 (90 Base) MCG/ACT inhaler Inhale 2 puffs into the lungs every 4 (four) hours as needed for shortness of breath. 1 Inhaler 2  . amLODipine (NORVASC) 10 MG tablet TAKE 1 TABLET BY MOUTH EVERY DAY 90 tablet 2  . aspirin 81 MG EC tablet TAKE 1 TABLET (81 MG TOTAL) BY MOUTH DAILY. 90 tablet 1  . Blood Glucose Monitoring Suppl (ONETOUCH VERIO IQ SYSTEM) w/Device KIT Use to check blood sugar once to twice daily. 1 kit 0  . fluticasone (FLONASE) 50 MCG/ACT nasal spray Place 2 sprays into both nostrils daily. 16 g 6  . gabapentin (NEURONTIN) 300 MG capsule Take 1 capsule (300 mg total) by mouth 2 (two) times daily. 60 capsule 2  . glipiZIDE (GLUCOTROL) 10 MG tablet TAKE 1 TABLET BY MOUTH TWICE A DAY BEFORE A MEAL 180 tablet 2  . hydrochlorothiazide (HYDRODIURIL) 25 MG tablet Take 1 tablet (25 mg total) by mouth daily. 90 tablet 1  . HYDROcodone-acetaminophen (NORCO) 10-325 MG tablet Take 1 tablet by mouth every 6 (six) hours.  0  . ibuprofen (ADVIL,MOTRIN) 400 MG tablet TAKE 1 TABLET 3 TIMES DAILY.  0  . LINZESS 145 MCG CAPS capsule TAKE ONE CAPSULE BY MOUTH EVERY MORNING 30  capsule 3  . metFORMIN (GLUCOPHAGE-XR) 500 MG 24 hr tablet TAKE 2 TABLETS BY MOUTH EVERY DAY WITH BREAKFAST 180 tablet 2  . methocarbamol (ROBAXIN) 500 MG tablet Take 1 tablet (500 mg total) by mouth 2 (two) times daily. 20 tablet 0  . montelukast (SINGULAIR) 10 MG tablet TAKE 1 TABLET BY MOUTH AT BEDTIME 90 tablet 0  . nebivolol (BYSTOLIC) 5 MG tablet Take 1 tablet (5 mg total) by mouth daily. 30 tablet 5  . ONETOUCH VERIO test strip Use to check blood sugar one to two times daily. 300 each 1  . potassium chloride (MICRO-K) 10 MEQ CR capsule TAKE ONE CAPSULE BY MOUTH TWICE A DAY 180 capsule 3  . VOLTAREN 1 % GEL APPLY 2 GRAMS 3 TIMES DAILY.  0   No current facility-administered medications on file prior to visit.     Past Medical History:  Diagnosis Date  . Asthma   . CAD (coronary artery disease) 11/2003    RCA 60% stenosis, LAD diffuse  . Chicken pox   . CVA (cerebrovascular accident) [facility]) 2001    right frontoparietal cortical CVA - MRI September 2001  . Diabetes mellitus   . Diastolic dysfunction     ejection fraction 55-65% on 2-D echo May 2006  . Empty sella syndrome [facility])     based on MRI September 2001, related to obesity, hypertension, it was determined that patient has not required treatment but will be monitored  with TSH, ACTH, cortisol, testosterone, prolactin, growth hormone  . GERD (gastroesophageal reflux disease)     EGD June 2004 -  followed by Dr. Carlean Purl  . Granulomatous disease, chronic (HCC)     in right upper lobe per x-ray December 2005  . Hyperlipidemia   . Hypertension   . NAFLD (nonalcoholic fatty liver disease)     based on ultrasound June 2002, elevated transaminase  . OA (osteoarthritis)     DJP coracoclavicular ligament, left patella osteophytes June 2002, L4-S1 spondylosis, degenerative disc disease with bulge. I in October 2001, the C2-C4 spondylosis without stenosis and with spurs based on x-ray July 2005; diffuse idiopathic skeletal hyperostosis  with bilateral hip lumbar degenerative changes   on x-ray February 2005, bilateral plantar calcaneal spurs  . Reactive airway disease     peak flow 180 in May 2005, chronic sinusitis with PND, bronchitis December 2002 and strep pneumonia April 2007,  FEC 66, MCV 60, ratio is 69, DLCO 57,  moderate restrictive and mild obstructive disease  on spirometry May 2005    Past Surgical History:  Procedure Laterality Date  . CARPAL TUNNEL RELEASE  1992    left arm 1992  . CHOLECYSTECTOMY  2003  . TONSILLECTOMY AND ADENOIDECTOMY      Social History   Socioeconomic History  . Marital status: Single    Spouse name: Not on file  . Number of children: 1  . Years of education: 11  . Highest education level: Not on file  Social Needs  . Financial resource strain: Not on file  . Food insecurity - worry: Not on file  . Food insecurity - inability: Not on file  . Transportation needs - medical: Not on file  . Transportation needs - non-medical: Not on file  Occupational History  . Occupation:  fromer Psychologist, counselling  Tobacco Use  . Smoking status: Former Smoker    Packs/day: 0.25    Years: 4.00    Pack years: 1.00    Types: Cigarettes  . Smokeless tobacco: Never Used  . Tobacco comment: smoked in 20's   Substance and Sexual Activity  . Alcohol use: No    Alcohol/week: 0.0 oz  . Drug use: No  . Sexual activity: Not Currently    Birth control/protection: Post-menopausal  Other Topics Concern  . Not on file  Social History Narrative   Born and raised in [place], [place]. Currently lives in a private residence with her nephew and his wife. Fun: Church activities, spending time with family   Denies any religious beliefs that would effect health care.     Family History  Problem Relation Age of Onset  . Lung cancer Mother   . Hypertension Mother   . Lung cancer Sister   . Hypertension Sister     Review of Systems     Objective:  There were no vitals filed for this visit. Wt  Readings from Last 3 Encounters:  04/01/17 284 lb (128.8 kg)  10/29/16 276 lb (125.2 kg)  09/23/16 274 lb (124.3 kg)   There is no height or weight on file to calculate BMI.   Physical Exam         Assessment & Plan:    See Problem List for Assessment and Plan of chronic medical problems.   This encounter was created in error - please disregard.

## 2017-04-24 NOTE — Patient Instructions (Signed)
  Test(s) ordered today. Your results will be released to MyChart (or called to you) after review, usually within 72hours after test completion. If any changes need to be made, you will be notified at that same time.  All other Health Maintenance issues reviewed.   All recommended immunizations and age-appropriate screenings are up-to-date or discussed.  No immunizations administered today.   Medications reviewed and updated.  Changes include  /  No changes recommended at this time.  Your prescription(s) have been submitted to your pharmacy. Please take as directed and contact our office if you believe you are having problem(s) with the medication(s).  A referral was ordered for   Please followup in    

## 2017-04-26 ENCOUNTER — Encounter: Payer: Medicare Other | Admitting: Internal Medicine

## 2017-05-10 DIAGNOSIS — G894 Chronic pain syndrome: Secondary | ICD-10-CM | POA: Diagnosis not present

## 2017-05-10 DIAGNOSIS — M545 Low back pain: Secondary | ICD-10-CM | POA: Diagnosis not present

## 2017-05-10 DIAGNOSIS — Z79891 Long term (current) use of opiate analgesic: Secondary | ICD-10-CM | POA: Diagnosis not present

## 2017-05-10 DIAGNOSIS — M25569 Pain in unspecified knee: Secondary | ICD-10-CM | POA: Diagnosis not present

## 2017-05-13 ENCOUNTER — Encounter: Payer: Self-pay | Admitting: Internal Medicine

## 2017-05-29 ENCOUNTER — Other Ambulatory Visit: Payer: Self-pay | Admitting: Internal Medicine

## 2017-06-07 DIAGNOSIS — G894 Chronic pain syndrome: Secondary | ICD-10-CM | POA: Diagnosis not present

## 2017-06-07 DIAGNOSIS — Z79891 Long term (current) use of opiate analgesic: Secondary | ICD-10-CM | POA: Diagnosis not present

## 2017-06-07 DIAGNOSIS — M25569 Pain in unspecified knee: Secondary | ICD-10-CM | POA: Diagnosis not present

## 2017-06-07 DIAGNOSIS — M545 Low back pain: Secondary | ICD-10-CM | POA: Diagnosis not present

## 2017-07-07 DIAGNOSIS — M545 Low back pain: Secondary | ICD-10-CM | POA: Diagnosis not present

## 2017-07-07 DIAGNOSIS — M25569 Pain in unspecified knee: Secondary | ICD-10-CM | POA: Diagnosis not present

## 2017-07-07 DIAGNOSIS — G894 Chronic pain syndrome: Secondary | ICD-10-CM | POA: Diagnosis not present

## 2017-07-07 DIAGNOSIS — Z79891 Long term (current) use of opiate analgesic: Secondary | ICD-10-CM | POA: Diagnosis not present

## 2017-07-26 ENCOUNTER — Other Ambulatory Visit: Payer: Self-pay | Admitting: Emergency Medicine

## 2017-07-26 MED ORDER — NEBIVOLOL HCL 5 MG PO TABS: 5.0000 mg | ORAL_TABLET | Freq: Every day | ORAL | 0 refills | Status: DC

## 2017-08-03 DIAGNOSIS — M545 Low back pain: Secondary | ICD-10-CM | POA: Diagnosis not present

## 2017-08-03 DIAGNOSIS — M25569 Pain in unspecified knee: Secondary | ICD-10-CM | POA: Diagnosis not present

## 2017-08-03 DIAGNOSIS — Z79891 Long term (current) use of opiate analgesic: Secondary | ICD-10-CM | POA: Diagnosis not present

## 2017-08-03 DIAGNOSIS — G894 Chronic pain syndrome: Secondary | ICD-10-CM | POA: Diagnosis not present

## 2017-08-27 ENCOUNTER — Other Ambulatory Visit: Payer: Self-pay | Admitting: Internal Medicine

## 2017-09-01 DIAGNOSIS — M25569 Pain in unspecified knee: Secondary | ICD-10-CM | POA: Diagnosis not present

## 2017-09-01 DIAGNOSIS — G894 Chronic pain syndrome: Secondary | ICD-10-CM | POA: Diagnosis not present

## 2017-09-01 DIAGNOSIS — Z79891 Long term (current) use of opiate analgesic: Secondary | ICD-10-CM | POA: Diagnosis not present

## 2017-09-01 DIAGNOSIS — M545 Low back pain: Secondary | ICD-10-CM | POA: Diagnosis not present

## 2017-09-02 ENCOUNTER — Other Ambulatory Visit: Payer: Self-pay | Admitting: Internal Medicine

## 2017-09-20 ENCOUNTER — Other Ambulatory Visit: Payer: Self-pay | Admitting: Internal Medicine

## 2017-09-20 MED ORDER — MONTELUKAST SODIUM 10 MG PO TABS: 10.0000 mg | ORAL_TABLET | Freq: Every day | ORAL | 1 refills | Status: DC

## 2017-09-22 ENCOUNTER — Other Ambulatory Visit: Payer: Self-pay | Admitting: Internal Medicine

## 2017-10-01 DIAGNOSIS — Z79891 Long term (current) use of opiate analgesic: Secondary | ICD-10-CM | POA: Diagnosis not present

## 2017-10-01 DIAGNOSIS — M25569 Pain in unspecified knee: Secondary | ICD-10-CM | POA: Diagnosis not present

## 2017-10-01 DIAGNOSIS — M545 Low back pain: Secondary | ICD-10-CM | POA: Diagnosis not present

## 2017-10-01 DIAGNOSIS — G894 Chronic pain syndrome: Secondary | ICD-10-CM | POA: Diagnosis not present

## 2017-10-05 ENCOUNTER — Encounter: Payer: Self-pay | Admitting: Family Medicine

## 2017-10-05 ENCOUNTER — Ambulatory Visit (INDEPENDENT_AMBULATORY_CARE_PROVIDER_SITE_OTHER): Payer: Medicare Other | Admitting: Family Medicine

## 2017-10-05 ENCOUNTER — Ambulatory Visit: Payer: Self-pay | Admitting: *Deleted

## 2017-10-05 DIAGNOSIS — R059 Cough, unspecified: Secondary | ICD-10-CM

## 2017-10-05 DIAGNOSIS — R05 Cough: Secondary | ICD-10-CM

## 2017-10-05 MED ORDER — PREDNISONE 20 MG PO TABS: 40.0000 mg | ORAL_TABLET | Freq: Every day | ORAL | 0 refills | Status: DC

## 2017-10-05 NOTE — Progress Notes (Signed)
Lindsay Hardy - 67 y.o. female MRN [id number]  Date of birth: 06-Aug-1950  SUBJECTIVE:  Including CC & ROS.  Chief Complaint  Patient presents with  . Cough    Lindsay Hardy is a 67 y.o. female that is presenting with cough and shortness of breath. Ongoing for five days. Admits to productive cough with mucous green in color. Denies fevers. Admits to shortness of breath. She has been using Proair with no improvement. Has a history of asthma. Has been using her inhaler intermittently. No fevers.   Review of Systems  Constitutional: Negative for fever.  HENT: Positive for congestion and sinus pressure.   Respiratory: Positive for cough.   Cardiovascular: Negative for chest pain.  Gastrointestinal: Negative for abdominal pain.    HISTORY: Past Medical, Surgical, Social, and Family History Reviewed & Updated per EMR.   Pertinent Historical Findings include:  Past Medical History:  Diagnosis Date  . Asthma   . CAD (coronary artery disease) 11/2003    RCA 60% stenosis, LAD diffuse  . Chicken pox   . CVA (cerebrovascular accident) [facility]) 2001    right frontoparietal cortical CVA - MRI September 2001  . Diabetes mellitus   . Diastolic dysfunction     ejection fraction 55-65% on 2-D echo May 2006  . Empty sella syndrome [facility])     based on MRI September 2001, related to obesity, hypertension, it was determined that patient has not required treatment but will be monitored with TSH, ACTH, cortisol, testosterone, prolactin, growth hormone  . GERD (gastroesophageal reflux disease)     EGD June 2004 -  followed by Dr. Leone Payor  . Granulomatous disease, chronic (HCC)     in right upper lobe per x-ray December 2005  . Hyperlipidemia   . Hypertension   . NAFLD (nonalcoholic fatty liver disease)     based on ultrasound June 2002, elevated transaminase  . OA (osteoarthritis)     DJP coracoclavicular ligament, left patella osteophytes June 2002, L4-S1 spondylosis, degenerative disc disease with  bulge. I in October 2001, the C2-C4 spondylosis without stenosis and with spurs based on x-ray July 2005; diffuse idiopathic skeletal hyperostosis with bilateral hip lumbar degenerative changes   on x-ray February 2005, bilateral plantar calcaneal spurs  . Reactive airway disease     peak flow 180 in May 2005, chronic sinusitis with PND, bronchitis December 2002 and strep pneumonia April 2007,  FEC 66, MCV 60, ratio is 69, DLCO 57,  moderate restrictive and mild obstructive disease  on spirometry May 2005    Past Surgical History:  Procedure Laterality Date  . CARPAL TUNNEL RELEASE  1992    left arm 1992  . CHOLECYSTECTOMY  2003  . TONSILLECTOMY AND ADENOIDECTOMY      Allergies  Allergen Reactions  . Atorvastatin     REACTION: unable to tolerate. reason unknown  . Clopidogrel Bisulfate     REACTION: weak, tired, sleepy, tightness (6/05); soreness in hip (11/05)  . Crestor [Rosuvastatin Calcium]     Did not like how she felt on it  . Fexofenadine     REACTION: intolerance unknown  . Furosemide     REACTION: did not tolerate and preferred HCTZ  . Hydrochlorothiazide W-Triamterene     REACTION: things crawled in her head and felt funny(10/04) ; h/a (3/07)  . Lisinopril     REACTION: cough, right sided pain  . Metoprolol Tartrate     REACTION: lightheadedness, h/a cough (7/04); heart gong to stop (7/06); sluggishnes and depressed feeling (  2/07); weakness/heaviness 6/07)  . Mometasone Furoate     REACTION: neck tenderness    Family History  Problem Relation Age of Onset  . Lung cancer Mother   . Hypertension Mother   . Lung cancer Sister   . Hypertension Sister      Social History   Socioeconomic History  . Marital status: Single    Spouse name: Not on file  . Number of children: 1  . Years of education: 43  . Highest education level: Not on file  Occupational History  . Occupation:  fromer Agricultural engineer  Social Needs  . Financial resource strain: Not on file  .  Food insecurity:    Worry: Not on file    Inability: Not on file  . Transportation needs:    Medical: Not on file    Non-medical: Not on file  Tobacco Use  . Smoking status: Former Smoker    Packs/day: 0.25    Years: 4.00    Pack years: 1.00    Types: Cigarettes  . Smokeless tobacco: Never Used  . Tobacco comment: smoked in 20's   Substance and Sexual Activity  . Alcohol use: No    Alcohol/week: 0.0 oz  . Drug use: No  . Sexual activity: Not Currently    Birth control/protection: Post-menopausal  Lifestyle  . Physical activity:    Days per week: Not on file    Minutes per session: Not on file  . Stress: Not on file  Relationships  . Social connections:    Talks on phone: Not on file    Gets together: Not on file    Attends religious service: Not on file    Active member of club or organization: Not on file    Attends meetings of clubs or organizations: Not on file    Relationship status: Not on file  . Intimate partner violence:    Fear of current or ex partner: Not on file    Emotionally abused: Not on file    Physically abused: Not on file    Forced sexual activity: Not on file  Other Topics Concern  . Not on file  Social History Narrative   Born and raised in [place], [place]. Currently lives in a private residence with her nephew and his wife. Fun: Church activities, spending time with family   Denies any religious beliefs that would effect health care.      PHYSICAL EXAM:  VS: BP 138/66 (BP Location: Left Arm, Patient Position: Sitting, Cuff Size: Normal)   Pulse 92   Temp 98.3 F (36.8 C) (Oral)   Ht  (1.651 m)   Wt 279 lb (126.6 kg)   SpO2 98%   BMI 46.43 kg/m  Physical Exam Gen: NAD, alert, cooperative with exam,  ENT: normal lips, normal nasal mucosa, tympanic membranes clear and intact bilaterally, normal oropharynx,  Eye: normal EOM, normal conjunctiva and lids CV:  no edema, +2 pedal pulses, regular rate and rhythm, S1-S2   Resp: no  accessory muscle use, non-labored, clear to auscultation bilaterally, mild end expiratory wheezes Skin: no rashes, no areas of induration  Neuro: normal tone, normal sensation to touch Psych:  normal insight, alert and oriented MSK: Normal gait, normal strength       ASSESSMENT & PLAN:   Cough Doesn't appear to be a full blown asthma exacerbation but does have some wheezing  - prednisone  - counseled on inhaler use  - given indications to follow up.

## 2017-10-05 NOTE — Patient Instructions (Signed)
Please take the steroids  Please try the albuterol every 4-6 hours for the next day  Please use the flonase  Please follow up if your symptoms don't improve

## 2017-10-05 NOTE — Telephone Encounter (Signed)
Pt has asthma  - she  Reports symptoms of shortness of breath and  Wheezing at times. Worse at night.Patient  Reports a cough as well.At this time pt is speaking in complete sentances. No availability with Dr  Lawerance Bach today . Appt made with Dr Jordan Likes for today at 4 Pm   Reason for Disposition . [1] MODERATE longstanding difficulty breathing (e.g., speaks in phrases, SOB even at rest, pulse 100-120) AND [2] SAME as normal  Answer Assessment - Initial Assessment Questions 1. RESPIRATORY STATUS: "Describe your breathing?" (e.g., wheezing, shortness of breath, unable to speak, severe coughing)      Wheezing worse at night - shortness of breath worse  2. ONSET: "When did this breathing problem begin?"       5  Days   3. PATTERN "Does the difficult breathing come and go, or has it been constant since it started?"       Comes and goes   4. SEVERITY: "How bad is your breathing?" (e.g., mild, moderate, severe)    - MILD: No SOB at rest, mild SOB with walking, speaks normally in sentences, can lay down, no retractions, pulse < 100.    - MODERATE: SOB at rest, SOB with minimal exertion and prefers to sit, cannot lie down flat, speaks in phrases, mild retractions, audible wheezing, pulse 100-120.    - SEVERE: Very SOB at rest, speaks in single words, struggling to breathe, sitting hunched forward, retractions, pulse > 120       Mild   5. RECURRENT SYMPTOM: "Have you had difficulty breathing before?" If so, ask: "When was the last time?" and "What happened that time?"       Yes  About this  Time  Last year   6. CARDIAC HISTORY: "Do you have any history of heart disease?" (e.g., heart attack, angina, bypass surgery, angioplasty)         No  7. LUNG HISTORY: "Do you have any history of lung disease?"  (e.g., pulmonary embolus, asthma, emphysema)     Asthma   8. CAUSE: "What do you think is causing the breathing problem?"        Pollen  9. OTHER SYMPTOMS: "Do you have any other symptoms? (e.g., dizziness,  runny nose, cough, chest pain, fever)    Cough  And   Runny nose   Itchy and  Watery  Eyes    10. PREGNANCY: "Is there any chance you are pregnant?" "When was your last menstrual period?"       n/a 11. TRAVEL: "Have you traveled out of the country in the last month?" (e.g., travel history, exposures)        No  Protocols used: BREATHING DIFFICULTY-A-AH

## 2017-10-06 DIAGNOSIS — R059 Cough, unspecified: Secondary | ICD-10-CM | POA: Insufficient documentation

## 2017-10-06 DIAGNOSIS — R05 Cough: Secondary | ICD-10-CM | POA: Insufficient documentation

## 2017-10-06 NOTE — Assessment & Plan Note (Signed)
Doesn't appear to be a full blown asthma exacerbation but does have some wheezing  - prednisone  - counseled on inhaler use  - given indications to follow up.

## 2017-10-18 ENCOUNTER — Other Ambulatory Visit: Payer: Self-pay | Admitting: Internal Medicine

## 2017-10-28 DIAGNOSIS — M545 Low back pain: Secondary | ICD-10-CM | POA: Diagnosis not present

## 2017-10-28 DIAGNOSIS — G894 Chronic pain syndrome: Secondary | ICD-10-CM | POA: Diagnosis not present

## 2017-10-28 DIAGNOSIS — M25569 Pain in unspecified knee: Secondary | ICD-10-CM | POA: Diagnosis not present

## 2017-10-28 DIAGNOSIS — Z79891 Long term (current) use of opiate analgesic: Secondary | ICD-10-CM | POA: Diagnosis not present

## 2017-11-26 ENCOUNTER — Other Ambulatory Visit: Payer: Self-pay | Admitting: Internal Medicine

## 2017-11-26 DIAGNOSIS — Z79891 Long term (current) use of opiate analgesic: Secondary | ICD-10-CM | POA: Diagnosis not present

## 2017-11-26 DIAGNOSIS — G894 Chronic pain syndrome: Secondary | ICD-10-CM | POA: Diagnosis not present

## 2017-11-26 DIAGNOSIS — M25569 Pain in unspecified knee: Secondary | ICD-10-CM | POA: Diagnosis not present

## 2017-11-26 DIAGNOSIS — M545 Low back pain: Secondary | ICD-10-CM | POA: Diagnosis not present

## 2017-12-04 ENCOUNTER — Other Ambulatory Visit: Payer: Self-pay | Admitting: Internal Medicine

## 2017-12-16 ENCOUNTER — Ambulatory Visit: Payer: Self-pay | Admitting: *Deleted
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
## 2017-12-17 ENCOUNTER — Ambulatory Visit: Payer: Medicare Other | Admitting: Family

## 2017-12-17 DIAGNOSIS — Z0289 Encounter for other administrative examinations: Secondary | ICD-10-CM

## 2017-12-17 NOTE — Telephone Encounter (Signed)
Patient did not make appointment today.

## 2017-12-29 DIAGNOSIS — M545 Low back pain: Secondary | ICD-10-CM | POA: Diagnosis not present

## 2017-12-29 DIAGNOSIS — M25569 Pain in unspecified knee: Secondary | ICD-10-CM | POA: Diagnosis not present

## 2017-12-29 DIAGNOSIS — G894 Chronic pain syndrome: Secondary | ICD-10-CM | POA: Diagnosis not present

## 2017-12-29 DIAGNOSIS — Z79891 Long term (current) use of opiate analgesic: Secondary | ICD-10-CM | POA: Diagnosis not present

## 2017-12-30 ENCOUNTER — Other Ambulatory Visit: Payer: Self-pay | Admitting: Internal Medicine

## 2017-12-30 NOTE — Telephone Encounter (Signed)
Copied from CRM [phone]#[id number]. Topic: Quick Communication - Rx Refill/Question >> Dec 30, 2017 11:57 AM Herby AbrahamJohnson, Shiquita C wrote: Medication: amLODipine (NORVASC) 10 MG tablet and also PROAIR HFA 108 (90 Base) MCG/ACT inhaler --- pt is  scheduled to come in on 01/27/18 (PCP next available) pt is completely out of her medication, she would like to know if provider would refill until her apt?   Has the patient contacted their pharmacy? No  (Agent: If no, request that the patient contact the pharmacy for the refill.) (Agent: If yes, when and what did the pharmacy advise?)  Preferred Pharmacy (with phone number or street name): [facility] [phone][id number] [place], [place] - [address] [phone] (Phone) [phone] (Fax)      Agent: Please be advised that RX refills may take up to 3 business days. We ask that you follow-up with your pharmacy.

## 2018-01-22 ENCOUNTER — Other Ambulatory Visit: Payer: Self-pay | Admitting: Internal Medicine

## 2018-01-26 DIAGNOSIS — M545 Low back pain: Secondary | ICD-10-CM | POA: Diagnosis not present

## 2018-01-26 DIAGNOSIS — M25569 Pain in unspecified knee: Secondary | ICD-10-CM | POA: Diagnosis not present

## 2018-01-26 DIAGNOSIS — Z79891 Long term (current) use of opiate analgesic: Secondary | ICD-10-CM | POA: Diagnosis not present

## 2018-01-26 DIAGNOSIS — G894 Chronic pain syndrome: Secondary | ICD-10-CM | POA: Diagnosis not present

## 2018-01-26 NOTE — Progress Notes (Deleted)
Subjective:   Lindsay Hardy is a 67 y.o. female who presents for Medicare Annual (Subsequent) preventive examination.  Review of Systems:  No ROS.  Medicare Wellness Visit. Additional risk factors are reflected in the social history.    Sleep patterns: {SX; SLEEP PATTERNS:18802::"feels rested on waking","does not get up to void","gets up *** times nightly to void","sleeps *** hours nightly"}.    Home Safety/Smoke Alarms: Feels safe in home. Smoke alarms in place.  Living environment; residence and Firearm Safety: {Rehab home environment / accessibility:30080::"no firearms","firearms stored safely"}. Seat Belt Safety/Bike Helmet: Wears seat belt.      Objective:     Vitals: There were no vitals taken for this visit.  There is no height or weight on file to calculate BMI.  Advanced Directives 02/17/2017 02/28/2015 10/15/2014 09/06/2014  Does Patient Have a Medical Advance Directive? No Yes No No  Copy of Healthcare Power of Attorney in Chart? - Yes - -  Would patient like information on creating a medical advance directive? No - Patient declined - No - patient declined information No - patient declined information    Tobacco Social History   Tobacco Use  Smoking Status Former Smoker  . Packs/day: 0.25  . Years: 4.00  . Pack years: 1.00  . Types: Cigarettes  Smokeless Tobacco Never Used  Tobacco Comment   smoked in 20's      Counseling given: Not Answered Comment: smoked in 20's   Past Medical History:  Diagnosis Date  . Asthma   . CAD (coronary artery disease) 11/2003    RCA 60% stenosis, LAD diffuse  . Chicken pox   . CVA (cerebrovascular accident) [facility]) 2001    right frontoparietal cortical CVA - MRI September 2001  . Diabetes mellitus   . Diastolic dysfunction     ejection fraction 55-65% on 2-D echo May 2006  . Empty sella syndrome [facility])     based on MRI September 2001, related to obesity, hypertension, it was determined that patient has not required treatment  but will be monitored with TSH, ACTH, cortisol, testosterone, prolactin, growth hormone  . GERD (gastroesophageal reflux disease)     EGD June 2004 -  followed by Dr. Carlean Purl  . Granulomatous disease, chronic (HCC)     in right upper lobe per x-ray December 2005  . Hyperlipidemia   . Hypertension   . NAFLD (nonalcoholic fatty liver disease)     based on ultrasound June 2002, elevated transaminase  . OA (osteoarthritis)     DJP coracoclavicular ligament, left patella osteophytes June 2002, L4-S1 spondylosis, degenerative disc disease with bulge. I in October 2001, the C2-C4 spondylosis without stenosis and with spurs based on x-ray July 2005; diffuse idiopathic skeletal hyperostosis with bilateral hip lumbar degenerative changes   on x-ray February 2005, bilateral plantar calcaneal spurs  . Reactive airway disease     peak flow 180 in May 2005, chronic sinusitis with PND, bronchitis December 2002 and strep pneumonia April 2007,  FEC 66, MCV 60, ratio is 69, DLCO 57,  moderate restrictive and mild obstructive disease  on spirometry May 2005   Past Surgical History:  Procedure Laterality Date  . CARPAL TUNNEL RELEASE  1992    left arm 1992  . CHOLECYSTECTOMY  2003  . TONSILLECTOMY AND ADENOIDECTOMY     Family History  Problem Relation Age of Onset  . Lung cancer Mother   . Hypertension Mother   . Lung cancer Sister   . Hypertension Sister    Social  History   Socioeconomic History  . Marital status: Single    Spouse name: Not on file  . Number of children: 1  . Years of education: 27  . Highest education level: Not on file  Occupational History  . Occupation:  fromer Psychologist, counselling  Social Needs  . Financial resource strain: Not on file  . Food insecurity:    Worry: Not on file    Inability: Not on file  . Transportation needs:    Medical: Not on file    Non-medical: Not on file  Tobacco Use  . Smoking status: Former Smoker    Packs/day: 0.25    Years: 4.00    Pack  years: 1.00    Types: Cigarettes  . Smokeless tobacco: Never Used  . Tobacco comment: smoked in 20's   Substance and Sexual Activity  . Alcohol use: No    Alcohol/week: 0.0 standard drinks  . Drug use: No  . Sexual activity: Not Currently    Birth control/protection: Post-menopausal  Lifestyle  . Physical activity:    Days per week: Not on file    Minutes per session: Not on file  . Stress: Not on file  Relationships  . Social connections:    Talks on phone: Not on file    Gets together: Not on file    Attends religious service: Not on file    Active member of club or organization: Not on file    Attends meetings of clubs or organizations: Not on file    Relationship status: Not on file  Other Topics Concern  . Not on file  Social History Narrative   Born and raised in [place], [place]. Currently lives in a private residence with her nephew and his wife. Fun: Church activities, spending time with family   Denies any religious beliefs that would effect health care.     Outpatient Encounter Medications as of 01/27/2018  Medication Sig  . amLODipine (NORVASC) 10 MG tablet TAKE 1 TABLET BY MOUTH EVERY DAY  . aspirin 81 MG EC tablet TAKE 1 TABLET (81 MG TOTAL) BY MOUTH DAILY.  Marland Kitchen Blood Glucose Monitoring Suppl (ONETOUCH VERIO IQ SYSTEM) w/Device KIT Use to check blood sugar once to twice daily.  . fluticasone (FLONASE) 50 MCG/ACT nasal spray Place 2 sprays into both nostrils daily.  Marland Kitchen gabapentin (NEURONTIN) 300 MG capsule Take 1 capsule (300 mg total) by mouth 2 (two) times daily.  Marland Kitchen glipiZIDE (GLUCOTROL) 10 MG tablet TAKE 1 TABLET BY MOUTH TWICE A DAY BEFORE A MEAL  . hydrochlorothiazide (HYDRODIURIL) 25 MG tablet Take 1 tablet (25 mg total) by mouth daily.  Marland Kitchen HYDROcodone-acetaminophen (NORCO) 10-325 MG tablet Take 1 tablet by mouth every 6 (six) hours.  Marland Kitchen ibuprofen (ADVIL,MOTRIN) 400 MG tablet TAKE 1 TABLET 3 TIMES DAILY.  Marland Kitchen LINZESS 145 MCG CAPS capsule TAKE ONE CAPSULE BY MOUTH  EVERY MORNING  . metFORMIN (GLUCOPHAGE-XR) 500 MG 24 hr tablet TAKE 2 TABLETS BY MOUTH EVERY DAY WITH BREAKFAST  . methocarbamol (ROBAXIN) 500 MG tablet Take 1 tablet (500 mg total) by mouth 2 (two) times daily.  . montelukast (SINGULAIR) 10 MG tablet Take 1 tablet (10 mg total) by mouth at bedtime.  . nebivolol (BYSTOLIC) 5 MG tablet Take 1 tablet (5 mg total) by mouth daily.  Glory Rosebush VERIO test strip Use to check blood sugar one to two times daily.  . potassium chloride (MICRO-K) 10 MEQ CR capsule TAKE ONE CAPSULE BY MOUTH TWICE A DAY  .  predniSONE (DELTASONE) 20 MG tablet Take 2 tablets (40 mg total) by mouth daily with breakfast.  . PROAIR HFA 108 (90 Base) MCG/ACT inhaler INHALE 2 PUFFS INTO THE LUNGS EVERY 4 (FOUR) HOURS AS NEEDED FOR SHORTNESS OF BREATH.  . VOLTAREN 1 % GEL APPLY 2 GRAMS 3 TIMES DAILY.   No facility-administered encounter medications on file as of 01/27/2018.     Activities of Daily Living In your present state of health, do you have any difficulty performing the following activities: 02/17/2017  Hearing? N  Vision? N  Difficulty concentrating or making decisions? N  Walking or climbing stairs? Y  Dressing or bathing? N  Doing errands, shopping? Y  Preparing Food and eating ? Y  Using the Toilet? N  In the past six months, have you accidently leaked urine? N  Do you have problems with loss of bowel control? N  Managing your Medications? Y  Managing your Finances? Y  Housekeeping or managing your Housekeeping? Y  Some recent data might be hidden    Patient Care Team: Binnie Rail, MD as PCP - General (Internal Medicine)    Assessment:   This is a routine wellness examination for Lindsay Hardy. Physical assessment deferred to PCP.   Exercise Activities and Dietary recommendations   Diet (meal preparation, eat out, water intake, caffeinated beverages, dairy products, fruits and vegetables): {Desc; diets:16563}  Goals      Patient Stated   . goal reduce  fall risk (pt-stated)     Will speak with the doctor or pain center regarding a w/c  Will then discuss need to move with landlord; to an apt with one level  If she cannot move, may consider hospital bed to reduce fall risk  Due to multiple falls       Fall Risk Fall Risk  04/01/2017 02/17/2017 03/25/2016 02/28/2015 12/21/2014  Falls in the past year? No No Yes Yes No  Number falls in past yr: - - 2 or more - -  Injury with Fall? - - No - -  Risk Factor Category  - - - - -  Risk for fall due to : - - Impaired balance/gait;History of fall(s) Impaired balance/gait -  Risk for fall due to: Comment - - - see documentation -    Depression Screen PHQ 2/9 Scores 04/01/2017 02/17/2017 03/25/2016 02/28/2015  PHQ - 2 Score 0 0 0 0     Cognitive Function        Immunization History  Administered Date(s) Administered  . Influenza Whole 04/15/2006, 04/20/2007, 03/15/2008   Screening Tests Health Maintenance  Topic Date Due  . OPHTHALMOLOGY EXAM  05/31/1959  . TETANUS/TDAP  05/30/1968  . COLONOSCOPY  05/31/1999  . MAMMOGRAM  03/24/2014  . DEXA SCAN  05/30/2014  . PNA vac Low Risk Adult (1 of 2 - PCV13) 05/30/2014  . FOOT EXAM  02/04/2017  . URINE MICROALBUMIN  02/04/2017  . HEMOGLOBIN A1C  09/30/2017  . INFLUENZA VACCINE  01/06/2018  . Hepatitis C Screening  Completed      Plan:     I have personally reviewed and noted the following in the patient's chart:   . Medical and social history . Use of alcohol, tobacco or illicit drugs  . Current medications and supplements . Functional ability and status . Nutritional status . Physical activity . Advanced directives . List of other physicians . Vitals . Screenings to include cognitive, depression, and falls . Referrals and appointments  In addition, I have reviewed  and discussed with patient certain preventive protocols, quality metrics, and best practice recommendations. A written personalized care plan for preventive  services as well as general preventive health recommendations were provided to patient.     Michiel Cowboy, RN  01/26/2018

## 2018-01-27 ENCOUNTER — Ambulatory Visit: Payer: Medicare Other

## 2018-01-27 ENCOUNTER — Ambulatory Visit: Payer: Medicare Other | Admitting: Internal Medicine

## 2018-01-27 NOTE — Progress Notes (Deleted)
Subjective:    Patient ID: Lindsay Hardy, female    DOB: Oct 27, 1950, 67 y.o.   MRN: [id number]  HPI The patient is here for follow up.  Diabetes: She is taking her medication daily as prescribed. She is compliant with a diabetic diet. She is exercising regularly. She monitors her sugars and they have been running XXX. She checks her feet daily and denies foot lesions. She is up-to-date with an ophthalmology examination.  Diabetic peripheral neuropathy: She is taking gabapentin as prescribed.  Hyperlipidemia: She has not tolerated statins.  She is compliant with a low-fat/cholesterol diet.  She is not exercising regularly.  Hypertension: She is taking her medication daily. She is compliant with a low sodium diet.  She denies chest pain, palpitations, edema, shortness of breath and regular headaches. She is exercising regularly.  She does not monitor her blood pressure at home.  Morbid obesity:  Chronic lower back pain: She is following with pain management.  Medications and allergies reviewed with patient and updated if appropriate.  Patient Active Problem List   Diagnosis Date Noted  . Cough 10/06/2017  . Morbid obesity ([place]) 04/01/2017  . PAD (peripheral artery disease) ([place]) 10/29/2016  . Chronic back pain 09/23/2016  . Diabetic peripheral neuropathy ([place]) 02/05/2016  . 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 Hyperlipidemia 06/17/2006  . Essential hypertension 06/17/2006  . Coronary atherosclerosis 06/17/2006  . DIASTOLIC DYSFUNCTION 74/73/4037  . DISEASE, CEREBROVASCULAR NEC 06/17/2006  . Osteoarthritis of left knee 06/17/2006    Current Outpatient Medications on File Prior to Visit  Medication Sig Dispense Refill  . amLODipine (NORVASC) 10 MG tablet TAKE 1 TABLET BY MOUTH EVERY DAY 90 tablet 0  . aspirin 81 MG EC tablet TAKE 1 TABLET (81 MG TOTAL) BY MOUTH DAILY. 90 tablet 1  . Blood Glucose Monitoring Suppl (ONETOUCH VERIO  IQ SYSTEM) w/Device KIT Use to check blood sugar once to twice daily. 1 kit 0  . fluticasone (FLONASE) 50 MCG/ACT nasal spray Place 2 sprays into both nostrils daily. 16 g 6  . gabapentin (NEURONTIN) 300 MG capsule Take 1 capsule (300 mg total) by mouth 2 (two) times daily. 60 capsule 2  . glipiZIDE (GLUCOTROL) 10 MG tablet TAKE 1 TABLET BY MOUTH TWICE A DAY BEFORE A MEAL 60 tablet 0  . hydrochlorothiazide (HYDRODIURIL) 25 MG tablet Take 1 tablet (25 mg total) by mouth daily. 90 tablet 1  . HYDROcodone-acetaminophen (NORCO) 10-325 MG tablet Take 1 tablet by mouth every 6 (six) hours.  0  . ibuprofen (ADVIL,MOTRIN) 400 MG tablet TAKE 1 TABLET 3 TIMES DAILY.  0  . LINZESS 145 MCG CAPS capsule TAKE ONE CAPSULE BY MOUTH EVERY MORNING 30 capsule 3  . metFORMIN (GLUCOPHAGE-XR) 500 MG 24 hr tablet TAKE 2 TABLETS BY MOUTH EVERY DAY WITH BREAKFAST 180 tablet 2  . methocarbamol (ROBAXIN) 500 MG tablet Take 1 tablet (500 mg total) by mouth 2 (two) times daily. 20 tablet 0  . montelukast (SINGULAIR) 10 MG tablet Take 1 tablet (10 mg total) by mouth at bedtime. 90 tablet 1  . nebivolol (BYSTOLIC) 5 MG tablet Take 1 tablet (5 mg total) by mouth daily. 90 tablet 0  . ONETOUCH VERIO test strip Use to check blood sugar one to two times daily. 300 each 1  . potassium chloride (MICRO-K) 10 MEQ CR capsule TAKE ONE CAPSULE BY MOUTH TWICE A DAY 180 capsule 0  . predniSONE (DELTASONE) 20 MG tablet Take 2 tablets (  40 mg total) by mouth daily with breakfast. 10 tablet 0  . PROAIR HFA 108 (90 Base) MCG/ACT inhaler INHALE 2 PUFFS INTO THE LUNGS EVERY 4 (FOUR) HOURS AS NEEDED FOR SHORTNESS OF BREATH. 8.5 Inhaler 0  . VOLTAREN 1 % GEL APPLY 2 GRAMS 3 TIMES DAILY.  0   No current facility-administered medications on file prior to visit.     Past Medical History:  Diagnosis Date  . Asthma   . CAD (coronary artery disease) 11/2003    RCA 60% stenosis, LAD diffuse  . Chicken pox   . CVA (cerebrovascular accident) [facility])  2001    right frontoparietal cortical CVA - MRI September 2001  . Diabetes mellitus   . Diastolic dysfunction     ejection fraction 55-65% on 2-D echo May 2006  . Empty sella syndrome [facility])     based on MRI September 2001, related to obesity, hypertension, it was determined that patient has not required treatment but will be monitored with TSH, ACTH, cortisol, testosterone, prolactin, growth hormone  . GERD (gastroesophageal reflux disease)     EGD June 2004 -  followed by Dr. Carlean Purl  . Granulomatous disease, chronic (HCC)     in right upper lobe per x-ray December 2005  . Hyperlipidemia   . Hypertension   . NAFLD (nonalcoholic fatty liver disease)     based on ultrasound June 2002, elevated transaminase  . OA (osteoarthritis)     DJP coracoclavicular ligament, left patella osteophytes June 2002, L4-S1 spondylosis, degenerative disc disease with bulge. I in October 2001, the C2-C4 spondylosis without stenosis and with spurs based on x-ray July 2005; diffuse idiopathic skeletal hyperostosis with bilateral hip lumbar degenerative changes   on x-ray February 2005, bilateral plantar calcaneal spurs  . Reactive airway disease     peak flow 180 in May 2005, chronic sinusitis with PND, bronchitis December 2002 and strep pneumonia April 2007,  FEC 66, MCV 60, ratio is 69, DLCO 57,  moderate restrictive and mild obstructive disease  on spirometry May 2005    Past Surgical History:  Procedure Laterality Date  . CARPAL TUNNEL RELEASE  1992    left arm 1992  . CHOLECYSTECTOMY  2003  . TONSILLECTOMY AND ADENOIDECTOMY      Social History   Socioeconomic History  . Marital status: Single    Spouse name: Not on file  . Number of children: 1  . Years of education: 1  . Highest education level: Not on file  Occupational History  . Occupation:  fromer Psychologist, counselling  Social Needs  . Financial resource strain: Not on file  . Food insecurity:    Worry: Not on file    Inability: Not on file   . Transportation needs:    Medical: Not on file    Non-medical: Not on file  Tobacco Use  . Smoking status: Former Smoker    Packs/day: 0.25    Years: 4.00    Pack years: 1.00    Types: Cigarettes  . Smokeless tobacco: Never Used  . Tobacco comment: smoked in 20's   Substance and Sexual Activity  . Alcohol use: No    Alcohol/week: 0.0 standard drinks  . Drug use: No  . Sexual activity: Not Currently    Birth control/protection: Post-menopausal  Lifestyle  . Physical activity:    Days per week: Not on file    Minutes per session: Not on file  . Stress: Not on file  Relationships  . Social connections:  Talks on phone: Not on file    Gets together: Not on file    Attends religious service: Not on file    Active member of club or organization: Not on file    Attends meetings of clubs or organizations: Not on file    Relationship status: Not on file  Other Topics Concern  . Not on file  Social History Narrative   Born and raised in [place], [place]. Currently lives in a private residence with her nephew and his wife. Fun: Church activities, spending time with family   Denies any religious beliefs that would effect health care.     Family History  Problem Relation Age of Onset  . Lung cancer Mother   . Hypertension Mother   . Lung cancer Sister   . Hypertension Sister     Review of Systems     Objective:  There were no vitals filed for this visit. BP Readings from Last 3 Encounters:  10/05/17 138/66  04/01/17 (!) 154/90  02/17/17 (!) 160/99   Wt Readings from Last 3 Encounters:  10/05/17 279 lb (126.6 kg)  04/01/17 284 lb (128.8 kg)  10/29/16 276 lb (125.2 kg)   There is no height or weight on file to calculate BMI.   Physical Exam    Constitutional: Appears well-developed and well-nourished. No distress.  HENT:  Head: Normocephalic and atraumatic.  Neck: Neck supple. No tracheal deviation present. No thyromegaly present.  No cervical  lymphadenopathy Cardiovascular: Normal rate, regular rhythm and normal heart sounds.   No murmur heard. No carotid bruit .  No edema Pulmonary/Chest: Effort normal and breath sounds normal. No respiratory distress. No has no wheezes. No rales.  Skin: Skin is warm and dry. Not diaphoretic.  Psychiatric: Normal mood and affect. Behavior is normal.      Assessment & Plan:    See Problem List for Assessment and Plan of chronic medical problems.

## 2018-01-30 NOTE — Progress Notes (Signed)
Subjective:    Patient ID: Lindsay Hardy, female    DOB: 24-Dec-1950, 67 y.o.   MRN: [id number]  HPI The patient is here for follow up.  Diabetes: She is taking her medication daily as prescribed.  She was here last October and her sugars were very poorly controlled.  I have asked her to follow-up after month, but this is the first time she has been seen since then.  She is not compliant with a diabetic diet. She is not sure what she can eat for breakfast, her other meals are not that bad.  She is not exercising regularly, but she has signed up for silver sneakers. She needs a new monitor-she does not have one currently and she has not been able to check her sugars.   Diabetic peripheral neuropathy: She is no longer taking the gabapentin - it did not help.  Her left leg > right leg is numb.  Moving around a lot helps.    Hyperlipidemia: She has not tolerated statins.  She is not always compliant with a low-fat/cholesterol diet.  She is not exercising regularly.  Hypertension: She is taking her medication daily. She is somewhat compliant with a low sodium diet.  She denies chest pain, palpitations, shortness of breath and regular headaches. She is not exercising regularly.  She does not monitor her blood pressure at home.  Morbid obesity: She is currently not exercising.  She is not actively working on weight loss.  Chronic lower back pain: She is following with pain management in [place].  She has done PT.  She is interested in getting injections.  She denies radiculopathy.  She would like a walker on wheels with a seat.  She is not able to walk distances due to back pain.  She does want to try to be more active.  She would like to have a heating pad, which has helped in the past.  Medications and allergies reviewed with patient and updated if appropriate.  Patient Active Problem List   Diagnosis Date Noted  . Morbid obesity ([place]) 04/01/2017  . PAD (peripheral artery disease) ([place])  10/29/2016  . Chronic back pain 09/23/2016  . Diabetic peripheral neuropathy ([place]) 02/05/2016  . 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 Hyperlipidemia 06/17/2006  . Essential hypertension 06/17/2006  . Coronary atherosclerosis 06/17/2006  . DIASTOLIC DYSFUNCTION 49/70/2637  . DISEASE, CEREBROVASCULAR NEC 06/17/2006  . Osteoarthritis of left knee 06/17/2006    Current Outpatient Medications on File Prior to Visit  Medication Sig Dispense Refill  . amLODipine (NORVASC) 10 MG tablet TAKE 1 TABLET BY MOUTH EVERY DAY 90 tablet 0  . aspirin 81 MG EC tablet TAKE 1 TABLET (81 MG TOTAL) BY MOUTH DAILY. 90 tablet 1  . Blood Glucose Monitoring Suppl (ONETOUCH VERIO IQ SYSTEM) w/Device KIT Use to check blood sugar once to twice daily. 1 kit 0  . fluticasone (FLONASE) 50 MCG/ACT nasal spray Place 2 sprays into both nostrils daily. 16 g 6  . hydrochlorothiazide (HYDRODIURIL) 25 MG tablet Take 1 tablet (25 mg total) by mouth daily. 90 tablet 1  . HYDROcodone-acetaminophen (NORCO) 10-325 MG tablet Take 1 tablet by mouth every 6 (six) hours.  0  . ibuprofen (ADVIL,MOTRIN) 400 MG tablet TAKE 1 TABLET 3 TIMES DAILY.  0  . ONETOUCH VERIO test strip Use to check blood sugar one to two times daily. 300 each 1  . potassium chloride (MICRO-K) 10 MEQ CR capsule TAKE ONE CAPSULE  BY MOUTH TWICE A DAY 180 capsule 0  . VOLTAREN 1 % GEL APPLY 2 GRAMS 3 TIMES DAILY.  0   No current facility-administered medications on file prior to visit.     Past Medical History:  Diagnosis Date  . Asthma   . CAD (coronary artery disease) 11/2003    RCA 60% stenosis, LAD diffuse  . Chicken pox   . CVA (cerebrovascular accident) [facility]) 2001    right frontoparietal cortical CVA - MRI September 2001  . Diabetes mellitus   . Diastolic dysfunction     ejection fraction 55-65% on 2-D echo May 2006  . Empty sella syndrome [facility])     based on MRI September 2001, related to obesity,  hypertension, it was determined that patient has not required treatment but will be monitored with TSH, ACTH, cortisol, testosterone, prolactin, growth hormone  . GERD (gastroesophageal reflux disease)     EGD June 2004 -  followed by Dr. Carlean Purl  . Granulomatous disease, chronic (HCC)     in right upper lobe per x-ray December 2005  . Hyperlipidemia   . Hypertension   . NAFLD (nonalcoholic fatty liver disease)     based on ultrasound June 2002, elevated transaminase  . OA (osteoarthritis)     DJP coracoclavicular ligament, left patella osteophytes June 2002, L4-S1 spondylosis, degenerative disc disease with bulge. I in October 2001, the C2-C4 spondylosis without stenosis and with spurs based on x-ray July 2005; diffuse idiopathic skeletal hyperostosis with bilateral hip lumbar degenerative changes   on x-ray February 2005, bilateral plantar calcaneal spurs  . Reactive airway disease     peak flow 180 in May 2005, chronic sinusitis with PND, bronchitis December 2002 and strep pneumonia April 2007,  FEC 66, MCV 60, ratio is 69, DLCO 57,  moderate restrictive and mild obstructive disease  on spirometry May 2005    Past Surgical History:  Procedure Laterality Date  . CARPAL TUNNEL RELEASE  1992    left arm 1992  . CHOLECYSTECTOMY  2003  . TONSILLECTOMY AND ADENOIDECTOMY      Social History   Socioeconomic History  . Marital status: Single    Spouse name: Not on file  . Number of children: 1  . Years of education: 59  . Highest education level: Not on file  Occupational History  . Occupation:  fromer Psychologist, counselling  Social Needs  . Financial resource strain: Not on file  . Food insecurity:    Worry: Not on file    Inability: Not on file  . Transportation needs:    Medical: Not on file    Non-medical: Not on file  Tobacco Use  . Smoking status: Former Smoker    Packs/day: 0.25    Years: 4.00    Pack years: 1.00    Types: Cigarettes  . Smokeless tobacco: Never Used  .  Tobacco comment: smoked in 20's   Substance and Sexual Activity  . Alcohol use: No    Alcohol/week: 0.0 standard drinks  . Drug use: No  . Sexual activity: Not Currently    Birth control/protection: Post-menopausal  Lifestyle  . Physical activity:    Days per week: Not on file    Minutes per session: Not on file  . Stress: Not on file  Relationships  . Social connections:    Talks on phone: Not on file    Gets together: Not on file    Attends religious service: Not on file    Active member of  club or organization: Not on file    Attends meetings of clubs or organizations: Not on file    Relationship status: Not on file  Other Topics Concern  . Not on file  Social History Narrative   Born and raised in [place], [place]. Currently lives in a private residence with her nephew and his wife. Fun: Church activities, spending time with family   Denies any religious beliefs that would effect health care.     Family History  Problem Relation Age of Onset  . Lung cancer Mother   . Hypertension Mother   . Lung cancer Sister   . Hypertension Sister     Review of Systems  Constitutional: Negative for chills and fever.  Respiratory: Negative for cough, shortness of breath and wheezing.   Cardiovascular: Positive for leg swelling (mild at time). Negative for chest pain and palpitations.  Gastrointestinal: Positive for constipation.  Musculoskeletal: Positive for back pain (chronic).  Neurological: Positive for light-headedness (occ). Negative for headaches.       Objective:   Vitals:   02/01/18 1515  BP: 132/80  Pulse: 74  Resp: 18  Temp: 98.6 F (37 C)  SpO2: 95%   BP Readings from Last 3 Encounters:  02/01/18 132/80  10/05/17 138/66  04/01/17 (!) 154/90   Wt Readings from Last 3 Encounters:  02/01/18 282 lb (127.9 kg)  10/05/17 279 lb (126.6 kg)  04/01/17 284 lb (128.8 kg)   Body mass index is 46.93 kg/m.   Physical Exam    Constitutional: Appears  well-developed and well-nourished. No distress.  HENT:  Head: Normocephalic and atraumatic.  Neck: Neck supple. No tracheal deviation present. No thyromegaly present.  No cervical lymphadenopathy Cardiovascular: Normal rate, regular rhythm and normal heart sounds.   No murmur heard. No carotid bruit .  No edema Pulmonary/Chest: Effort normal and breath sounds normal. No respiratory distress. No has no wheezes. No rales.  Skin: Skin is warm and dry. Not diaphoretic.  Psychiatric: Normal mood and affect. Behavior is normal.   Diabetic Foot Exam - Simple   Simple Foot Form Diabetic Foot exam was performed with the following findings:  Yes 02/01/2018  4:57 PM  Visual Inspection No deformities, no ulcerations, no other skin breakdown bilaterally:  Yes Sensation Testing Intact to touch and monofilament testing bilaterally:  Yes Pulse Check Posterior Tibialis and Dorsalis pulse intact bilaterally:  Yes Comments Onychomycosis bilateral toenails       Assessment & Plan:    See Problem List for Assessment and Plan of chronic medical problems.

## 2018-02-01 ENCOUNTER — Ambulatory Visit (INDEPENDENT_AMBULATORY_CARE_PROVIDER_SITE_OTHER): Payer: Medicare Other | Admitting: Internal Medicine

## 2018-02-01 ENCOUNTER — Other Ambulatory Visit (INDEPENDENT_AMBULATORY_CARE_PROVIDER_SITE_OTHER): Payer: Medicare Other

## 2018-02-01 ENCOUNTER — Ambulatory Visit (INDEPENDENT_AMBULATORY_CARE_PROVIDER_SITE_OTHER): Payer: Medicare Other | Admitting: *Deleted

## 2018-02-01 ENCOUNTER — Encounter: Payer: Self-pay | Admitting: Internal Medicine

## 2018-02-01 VITALS — BP 132/80 | HR 74 | Resp 18 | Ht 65.0 in | Wt 282.0 lb

## 2018-02-01 VITALS — BP 132/80 | HR 74 | Temp 98.6°F | Resp 18 | Ht 65.0 in | Wt 282.0 lb

## 2018-02-01 DIAGNOSIS — I10 Essential (primary) hypertension: Secondary | ICD-10-CM | POA: Diagnosis not present

## 2018-02-01 DIAGNOSIS — E782 Mixed hyperlipidemia: Secondary | ICD-10-CM

## 2018-02-01 DIAGNOSIS — E1143 Type 2 diabetes mellitus with diabetic autonomic (poly)neuropathy: Secondary | ICD-10-CM

## 2018-02-01 DIAGNOSIS — E1142 Type 2 diabetes mellitus with diabetic polyneuropathy: Secondary | ICD-10-CM

## 2018-02-01 DIAGNOSIS — M5441 Lumbago with sciatica, right side: Secondary | ICD-10-CM

## 2018-02-01 DIAGNOSIS — Z0000 Encounter for general adult medical examination without abnormal findings: Secondary | ICD-10-CM

## 2018-02-01 DIAGNOSIS — G8929 Other chronic pain: Secondary | ICD-10-CM

## 2018-02-01 DIAGNOSIS — M5442 Lumbago with sciatica, left side: Secondary | ICD-10-CM

## 2018-02-01 LAB — COMPREHENSIVE METABOLIC PANEL
ALBUMIN: 3.8 g/dL (ref 3.5–5.2)
ALT: 29 U/L (ref 0–35)
AST: 20 U/L (ref 0–37)
Alkaline Phosphatase: 97 U/L (ref 39–117)
BUN: 11 mg/dL (ref 6–23)
CALCIUM: 9.3 mg/dL (ref 8.4–10.5)
CHLORIDE: 98 meq/L (ref 96–112)
CO2: 32 mEq/L (ref 19–32)
CREATININE: 0.81 mg/dL (ref 0.40–1.20)
GFR: 90.25 mL/min (ref >60.00)
Glucose, Bld: 291 mg/dL — ABNORMAL HIGH (ref 70–99)
Potassium: 3.5 mEq/L (ref 3.5–5.1)
Sodium: 136 mEq/L (ref 135–145)
TOTAL PROTEIN: 7.5 g/dL (ref 6.0–8.3)
Total Bilirubin: 0.4 mg/dL (ref 0.2–1.2)

## 2018-02-01 LAB — CBC WITH DIFFERENTIAL/PLATELET
BASOS ABS: 0.1 10*3/uL (ref 0.0–0.1)
BASOS PCT: 1 % (ref 0.0–3.0)
EOS ABS: 0.1 10*3/uL (ref 0.0–0.7)
Eosinophils Relative: 1.1 % (ref 0.0–5.0)
HEMATOCRIT: 44.3 % (ref 36.0–46.0)
Hemoglobin: 14.2 g/dL (ref 12.0–15.0)
LYMPHS PCT: 30.9 % (ref 12.0–46.0)
Lymphs Abs: 1.7 10*3/uL (ref 0.7–4.0)
MCHC: 32 g/dL (ref 30.0–36.0)
MCV: 81.5 fl (ref 78.0–100.0)
MONO ABS: 0.5 10*3/uL (ref 0.1–1.0)
Monocytes Relative: 9 % (ref 3.0–12.0)
Neutro Abs: 3.3 10*3/uL (ref 1.4–7.7)
Neutrophils Relative %: 58 % (ref 43.0–77.0)
Platelets: 248 10*3/uL (ref 150.0–400.0)
RBC: 5.43 Mil/uL — ABNORMAL HIGH (ref 3.87–5.11)
RDW: 14.1 % (ref 11.5–15.5)
WBC: 5.7 10*3/uL (ref 4.0–10.5)

## 2018-02-01 LAB — MICROALBUMIN / CREATININE URINE RATIO
CREATININE, U: 56.5 mg/dL
MICROALB UR: 4.6 mg/dL — AB (ref 0.0–1.9)
Microalb Creat Ratio: 8.1 mg/g (ref 0.0–30.0)

## 2018-02-01 LAB — LIPID PANEL
CHOL/HDL RATIO: 4
CHOLESTEROL: 174 mg/dL (ref 0–200)
HDL: 43.9 mg/dL (ref >39.00)
LDL CALC: 106 mg/dL — AB (ref 0–99)
NonHDL: 129.69
TRIGLYCERIDES: 116 mg/dL (ref 0.0–149.0)
VLDL: 23.2 mg/dL (ref 0.0–40.0)

## 2018-02-01 LAB — HEMOGLOBIN A1C: Hgb A1c MFr Bld: 14.1 % — ABNORMAL HIGH (ref 4.6–6.5)

## 2018-02-01 MED ORDER — GLIPIZIDE 10 MG PO TABS: ORAL_TABLET | ORAL | 5 refills | Status: DC

## 2018-02-01 MED ORDER — HEATING PAD DRY HEAT PADS: MEDICATED_PAD | 0 refills | Status: DC

## 2018-02-01 MED ORDER — BARIATRIC ROLLATOR MISC: 0 refills | Status: DC

## 2018-02-01 MED ORDER — LINACLOTIDE 145 MCG PO CAPS: 145.0000 ug | ORAL_CAPSULE | Freq: Every morning | ORAL | 1 refills | Status: DC

## 2018-02-01 MED ORDER — METFORMIN HCL ER 500 MG PO TB24
ORAL_TABLET | ORAL | 1 refills | Status: DC
Start: 2018-02-01 — End: 2018-02-08

## 2018-02-01 MED ORDER — ALBUTEROL SULFATE HFA 108 (90 BASE) MCG/ACT IN AERS: INHALATION_SPRAY | RESPIRATORY_TRACT | 5 refills | Status: DC

## 2018-02-01 MED ORDER — NEBIVOLOL HCL 5 MG PO TABS: 5.0000 mg | ORAL_TABLET | Freq: Every day | ORAL | 1 refills | Status: DC

## 2018-02-01 NOTE — Assessment & Plan Note (Signed)
Blood pressure currently controlled Continue current medications CMP

## 2018-02-01 NOTE — Assessment & Plan Note (Signed)
?    Neuropathy from chronic back pain or diabetes Currently not taking gabapentin Symptoms tolerable No treatment at this time, but stressed the need to get her sugars better controlled

## 2018-02-01 NOTE — Progress Notes (Addendum)
Subjective:   Lindsay Hardy is a 67 y.o. female who presents for Medicare Annual (Subsequent) preventive examination.  Review of Systems:  No ROS.  Medicare Wellness Visit. Additional risk factors are reflected in the social history.  Cardiac Risk Factors include: advanced age (>47mn, >>39women);diabetes mellitus;dyslipidemia;hypertension;obesity (BMI >30kg/m2);sedentary lifestyle Sleep patterns: gets up 1-2 times nightly to void and sleeps 9-10 hours nightly.    Home Safety/Smoke Alarms: Feels safe in home. Smoke alarms in place.  Living environment; residence and Firearm Safety: 1-story house/ trailer, no firearms. Lives alone, no needs for DME, good support system Seat Belt Safety/Bike Helmet: Wears seat belt.      Objective:     Vitals: BP 132/80   Pulse 74   Resp 18   Ht 5' 5"  (1.651 m)   Wt 282 lb (127.9 kg)   SpO2 95%   BMI 46.93 kg/m   Body mass index is 46.93 kg/m.  Advanced Directives 02/01/2018 02/17/2017 02/28/2015 10/15/2014 09/06/2014  Does Patient Have a Medical Advance Directive? No No Yes No No  Copy of Healthcare Power of Attorney in Chart? - - Yes - -  Would patient like information on creating a medical advance directive? No - Patient declined No - Patient declined - No - patient declined information No - patient declined information    Tobacco Social History   Tobacco Use  Smoking Status Former Smoker  . Packs/day: 0.25  . Years: 4.00  . Pack years: 1.00  . Types: Cigarettes  Smokeless Tobacco Never Used  Tobacco Comment   smoked in 20's      Counseling given: Not Answered Comment: smoked in 20's   Past Medical History:  Diagnosis Date  . Asthma   . CAD (coronary artery disease) 11/2003    RCA 60% stenosis, LAD diffuse  . Chicken pox   . CVA (ce[facility]brovascular accident) [facility]    right frontoparietal cortical CVA - MRI September 2001  . Diabetes mellitus   . Diastolic dysfunction     ejection fraction 55-65% on 2-D echo May 2006  .  Empty sella syndrome ([facility]     based on MRI September 2001, related to obesity, hypertension, it was determined that patient has not required treatment but will be monitored with TSH, ACTH, cortisol, testosterone, prolactin, growth hormone  . GERD (gastroesophageal reflux disease)     EGD June 2004 -  followed by Dr. GCarlean Purl . Granulomatous disease, chronic (HCC)     in right upper lobe per x-ray December 2005  . Hyperlipidemia   . Hypertension   . NAFLD (nonalcoholic fatty liver disease)     based on ultrasound June 2002, elevated transaminase  . OA (osteoarthritis)     DJP coracoclavicular ligament, left patella osteophytes June 2002, L4-S1 spondylosis, degenerative disc disease with bulge. I in October 2001, the C2-C4 spondylosis without stenosis and with spurs based on x-ray July 2005; diffuse idiopathic skeletal hyperostosis with bilateral hip lumbar degenerative changes   on x-ray February 2005, bilateral plantar calcaneal spurs  . Reactive airway disease     peak flow 180 in May 2005, chronic sinusitis with PND, bronchitis December 2002 and strep pneumonia April 2007,  FEC 66, MCV 60, ratio is 69, DLCO 57,  moderate restrictive and mild obstructive disease  on spirometry May 2005   Past Surgical History:  Procedure Laterality Date  . CARPAL TUNNEL RELEASE  1992    left arm 1992  . CHOLECYSTECTOMY  2003  . TONSILLECTOMY AND ADENOIDECTOMY  Family History  Problem Relation Age of Onset  . Lung cancer Mother   . Hypertension Mother   . Lung cancer Sister   . Hypertension Sister    Social History   Socioeconomic History  . Marital status: Single    Spouse name: Not on file  . Number of children: 1  . Years of education: 24  . Highest education level: Not on file  Occupational History  . Occupation:  former Psychologist, counselling  Social Needs  . Financial resource strain: Not very hard  . Food insecurity:    Worry: Never true    Inability: Never true  . Transportation  needs:    Medical: No    Non-medical: No  Tobacco Use  . Smoking status: Former Smoker    Packs/day: 0.25    Years: 4.00    Pack years: 1.00    Types: Cigarettes  . Smokeless tobacco: Never Used  . Tobacco comment: smoked in 20's   Substance and Sexual Activity  . Alcohol use: No    Alcohol/week: 0.0 standard drinks  . Drug use: No  . Sexual activity: Not Currently    Birth control/protection: Post-menopausal  Lifestyle  . Physical activity:    Days per week: 0 days    Minutes per session: 0 min  . Stress: To some extent  Relationships  . Social connections:    Talks on phone: More than three times a week    Gets together: More than three times a week    Attends religious service: More than 4 times per year    Active member of club or organization: Yes    Attends meetings of clubs or organizations: More than 4 times per year    Relationship status: Not on file  Other Topics Concern  . Not on file  Social History Narrative   Born and raised in [place], [place]. Currently lives in a private residence with her nephew and his wife. Fun: Church activities, spending time with family   Denies any religious beliefs that would effect health care.     Outpatient Encounter Medications as of 02/01/2018  Medication Sig  . albuterol (PROAIR HFA) 108 (90 Base) MCG/ACT inhaler INHALE 2 PUFFS INTO THE LUNGS EVERY 4 (FOUR) HOURS AS NEEDED FOR SHORTNESS OF BREATH.  Marland Kitchen amLODipine (NORVASC) 10 MG tablet TAKE 1 TABLET BY MOUTH EVERY DAY  . aspirin 81 MG EC tablet TAKE 1 TABLET (81 MG TOTAL) BY MOUTH DAILY.  Marland Kitchen Blood Glucose Monitoring Suppl (ONETOUCH VERIO IQ SYSTEM) w/Device KIT Use to check blood sugar once to twice daily.  . fluticasone (FLONASE) 50 MCG/ACT nasal spray Place 2 sprays into both nostrils daily.  Marland Kitchen glipiZIDE (GLUCOTROL) 10 MG tablet TAKE 1 TABLET BY MOUTH TWICE A DAY BEFORE A MEAL  . hydrochlorothiazide (HYDRODIURIL) 25 MG tablet Take 1 tablet (25 mg total) by mouth daily.  Marland Kitchen  HYDROcodone-acetaminophen (NORCO) 10-325 MG tablet Take 1 tablet by mouth every 6 (six) hours.  Marland Kitchen ibuprofen (ADVIL,MOTRIN) 400 MG tablet TAKE 1 TABLET 3 TIMES DAILY.  Marland Kitchen linaclotide (LINZESS) 145 MCG CAPS capsule Take 1 capsule (145 mcg total) by mouth every morning.  . metFORMIN (GLUCOPHAGE-XR) 500 MG 24 hr tablet TAKE 2 TABLETS BY MOUTH EVERY DAY WITH BREAKFAST  . nebivolol (BYSTOLIC) 5 MG tablet Take 1 tablet (5 mg total) by mouth daily.  Glory Rosebush VERIO test strip Use to check blood sugar one to two times daily.  . potassium chloride (MICRO-K) 10 MEQ CR capsule  TAKE ONE CAPSULE BY MOUTH TWICE A DAY  . VOLTAREN 1 % GEL APPLY 2 GRAMS 3 TIMES DAILY.   No facility-administered encounter medications on file as of 02/01/2018.     Activities of Daily Living In your present state of health, do you have any difficulty performing the following activities: 02/01/2018 02/17/2017  Hearing? N N  Vision? N N  Difficulty concentrating or making decisions? N N  Walking or climbing stairs? Y Y  Dressing or bathing? Y N  Doing errands, shopping? Tempie Donning  Preparing Food and eating ? Y Y  Using the Toilet? N N  In the past six months, have you accidently leaked urine? N N  Do you have problems with loss of bowel control? N N  Managing your Medications? N Y  Managing your Finances? N Y  Housekeeping or managing your Housekeeping? Tempie Donning  Some recent data might be hidden    Patient Care Team: Binnie Rail, MD as PCP - General (Internal Medicine)    Assessment:   This is a routine wellness examination for Shakeena. Physical assessment deferred to PCP.  Exercise Activities and Dietary recommendations Current Exercise Habits: The patient does not participate in regular exercise at present(chair exercise print-outs provided), Exercise limited by: orthopedic condition(s)(morbid obesity)  Diet (meal preparation, eat out, water intake, caffeinated beverages, dairy products, fruits and vegetables): in general, a  "healthy" diet  , well balanced   Reviewed heart healthy and diabetic diet. Encouraged patient to increase daily water and healthy fluid intake. Relevant patient education assigned to patient using Emmi.  Goals      Patient Stated   . goal reduce fall risk (pt-stated)     Will speak with the doctor or pain center regarding a w/c  Will then discuss need to move with landlord; to an apt with one level  If she cannot move, may consider hospital bed to reduce fall risk  Due to multiple falls      Other   . Patient Stated    . Patient Stated     Start to eat healthier by decreasing carbohydrates, sugar, and high fat foods. Start to do chair exercises and go to [employer] classes at [employer].       Fall Risk Fall Risk  02/01/2018 04/01/2017 02/17/2017 03/25/2016 02/28/2015  Falls in the past year? Yes No No Yes Yes  Number falls in past yr: 2 or more - - 2 or more -  Injury with Fall? No - - No -  Risk Factor Category  - - - - -  Risk for fall due to : Impaired mobility;Impaired balance/gait - - Impaired balance/gait;History of fall(s) Impaired balance/gait  Risk for fall due to: Comment - - - - see documentation  Follow up Falls prevention discussed - - - -   I Depression Screen PHQ 2/9 Scores 02/01/2018 04/01/2017 02/17/2017 03/25/2016  PHQ - 2 Score 4 0 0 0  PHQ- 9 Score 12 - - -     Cognitive Function       Ad8 score reviewed for issues:  Issues making decisions: no  Less interest in hobbies / activities: no  Repeats questions, stories (family complaining): no  Trouble using ordinary gadgets (microwave, computer, phone):no  Forgets the month or year: no  Mismanaging finances: no  Remembering appts: no  Daily problems with thinking and/or memory: no Ad8 score is= 0  Immunization History  Administered Date(s) Administered  . Influenza Whole 04/15/2006, 04/20/2007, 03/15/2008  Screening Tests Health Maintenance  Topic Date Due  . OPHTHALMOLOGY EXAM   05/31/1959  . TETANUS/TDAP  05/30/1968  . URINE MICROALBUMIN  02/04/2017  . HEMOGLOBIN A1C  09/30/2017  . INFLUENZA VACCINE  09/21/2018 (Originally 01/06/2018)  . MAMMOGRAM  02/02/2019 (Originally 03/24/2014)  . DEXA SCAN  02/02/2019 (Originally 05/30/2014)  . COLONOSCOPY  02/02/2019 (Originally 05/31/1999)  . PNA vac Low Risk Adult (1 of 2 - PCV13) 02/02/2019 (Originally 05/30/2014)  . FOOT EXAM  02/02/2019  . Hepatitis C Screening  Completed      Plan:    Patient will use resources provided to become more socially active outside of her home.  Patient will start to eat heart healthy diet (full of fruits, vegetables, whole grains, lean protein, water--limit salt, fat, and sugar intake) and increase physical activity as tolerated.   I have personally reviewed and noted the following in the patient's chart:   . Medical and social history . Use of alcohol, tobacco or illicit drugs  . Current medications and supplements . Functional ability and status . Nutritional status . Physical activity . Advanced directives . List of other physicians . Vitals . Screenings to include cognitive, depression, and falls . Referrals and appointments  In addition, I have reviewed and discussed with patient certain preventive protocols, quality metrics, and best practice recommendations. A written personalized care plan for preventive services as well as general preventive health recommendations were provided to patient.     Michiel Cowboy, RN  02/01/2018   Medical screening examination/treatment/procedure(s) were performed by non-physician practitioner and as supervising physician I was immediately available for consultation/collaboration. I agree with above. Binnie Rail, MD

## 2018-02-01 NOTE — Assessment & Plan Note (Signed)
Last A1c over 13 almost a year ago-this is the first time she is been seen since then Taking glipizide and metformin Check A1c today Will adjust medication if needed

## 2018-02-01 NOTE — Patient Instructions (Addendum)
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 Opens 8:30AM Wed Phone: [phone] Suggest an edit  Own this business    Health Maintenance, Female Adopting a healthy lifestyle and getting preventive care can go a long way to promote health and wellness. Talk with your health care provider about what schedule of regular examinations is right for you. This is a good chance for you to check in with your provider about disease prevention and staying healthy. In between checkups, there are plenty of things you can do on your own. Experts have done a lot of research about which lifestyle changes and preventive measures are most likely to keep you healthy. Ask your health care provider for more information. Weight and diet Eat a healthy diet  Be sure to include plenty of vegetables, fruits, low-fat dairy products, and lean protein.  Do not eat a lot of foods high in solid fats, added sugars, or salt.  Get regular exercise. This is one of the most important things you can do for your health. ? Most adults should exercise for at least 150 minutes each week. The exercise should increase your heart rate and make you sweat (moderate-intensity exercise). ? Most adults should also do strengthening exercises at least twice a week. This is in addition to the moderate-intensity exercise.  Maintain a healthy weight  Body mass index (BMI) is a measurement that can be used to identify possible weight problems. It estimates body fat based on height and weight. Your health care provider can help determine your BMI and help you achieve or maintain a healthy weight.  For females 69 years of age and older: ? A BMI below 18.5 is considered underweight. ? A BMI of 18.5 to 24.9 is normal. ? A BMI of 25 to 29.9 is considered overweight. ? A BMI of 30 and above is considered  obese.  Watch levels of cholesterol and blood lipids  You should start having your blood tested for lipids and cholesterol at 69 years of age, then have this test every 5 years.  You may need to have your cholesterol levels checked more often if: ? Your lipid or cholesterol levels are high. ? You are older than 69 years of age. ? You are at high risk for heart disease.  Cancer screening Lung Cancer  Lung cancer screening is recommended for adults 69-91 years old who are at high risk for lung cancer because of a history of smoking.  A yearly low-dose CT scan of the lungs is recommended for people who: ? Currently smoke. ? Have quit within the past 15 years. ? Have at least a 30-pack-year history of smoking. A pack year is smoking an average of one pack of cigarettes a day for 1 year.  Yearly screening should continue until it has been 15 years since you quit.  Yearly screening should stop if you develop a health problem that would prevent you from having lung cancer treatment.  Breast Cancer  Practice breast self-awareness. This means understanding how your breasts normally appear and feel.  It also means doing regular breast self-exams. Let your health care provider know about any changes, no matter how small.  If you are in your 69s or 30s, you should have a clinical breast exam (CBE) by a health care provider every 1-3 years as part of a regular health exam.  If you are  69 or older, have a CBE every year. Also consider having a breast X-ray (mammogram) every year.  If you have a family history of breast cancer, talk to your health care provider about genetic screening.  If you are at high risk for breast cancer, talk to your health care provider about having an MRI and a mammogram every year.  Breast cancer gene (BRCA) assessment is recommended for women who have family members with BRCA-related cancers. BRCA-related cancers  include: ? Breast. ? Ovarian. ? Tubal. ? Peritoneal cancers.  Results of the assessment will determine the need for genetic counseling and BRCA1 and BRCA2 testing.  Cervical Cancer Your health care provider may recommend that you be screened regularly for cancer of the pelvic organs (ovaries, uterus, and vagina). This screening involves a pelvic examination, including checking for microscopic changes to the surface of your cervix (Pap test). You may be encouraged to have this screening done every 3 years, beginning at age 69.  For women ages 69-65, health care providers may recommend pelvic exams and Pap testing every 3 years, or they may recommend the Pap and pelvic exam, combined with testing for human papilloma virus (HPV), every 5 years. Some types of HPV increase your risk of cervical cancer. Testing for HPV may also be done on women of any age with unclear Pap test results.  Other health care providers may not recommend any screening for nonpregnant women who are considered low risk for pelvic cancer and who do not have symptoms. Ask your health care provider if a screening pelvic exam is right for you.  If you have had past treatment for cervical cancer or a condition that could lead to cancer, you need Pap tests and screening for cancer for at least 20 years after your treatment. If Pap tests have been discontinued, your risk factors (such as having a new sexual partner) need to be reassessed to determine if screening should resume. Some women have medical problems that increase the chance of getting cervical cancer. In these cases, your health care provider may recommend more frequent screening and Pap tests.  Colorectal Cancer  This type of cancer can be detected and often prevented.  Routine colorectal cancer screening usually begins at 69 years of age and continues through 69 years of age.  Your health care provider may recommend screening at an earlier age if you have risk factors  for colon cancer.  Your health care provider may also recommend using home test kits to check for hidden blood in the stool.  A small camera at the end of a tube can be used to examine your colon directly (sigmoidoscopy or colonoscopy). This is done to check for the earliest forms of colorectal cancer.  Routine screening usually begins at age 25.  Direct examination of the colon should be repeated every 5-10 years through 69 years of age. However, you may need to be screened more often if early forms of precancerous polyps or small growths are found.  Skin Cancer  Check your skin from head to toe regularly.  Tell your health care provider about any new moles or changes in moles, especially if there is a change in a mole's shape or color.  Also tell your health care provider if you have a mole that is larger than the size of a pencil eraser.  Always use sunscreen. Apply sunscreen liberally and repeatedly throughout the day.  Protect yourself by wearing long sleeves, pants, a wide-brimmed hat, and sunglasses whenever you  are outside.  Heart disease, diabetes, and high blood pressure  High blood pressure causes heart disease and increases the risk of stroke. High blood pressure is more likely to develop in: ? People who have blood pressure in the high end of the normal range (130-139/85-89 mm Hg). ? People who are overweight or obese. ? People who are African American.  If you are 51-4 years of age, have your blood pressure checked every 3-5 years. If you are 45 years of age or older, have your blood pressure checked every year. You should have your blood pressure measured twice-once when you are at a hospital or clinic, and once when you are not at a hospital or clinic. Record the average of the two measurements. To check your blood pressure when you are not at a hospital or clinic, you can use: ? An automated blood pressure machine at a pharmacy. ? A home blood pressure monitor.  If  you are between 12 years and 83 years old, ask your health care provider if you should take aspirin to prevent strokes.  Have regular diabetes screenings. This involves taking a blood sample to check your fasting blood sugar level. ? If you are at a normal weight and have a low risk for diabetes, have this test once every three years after 69 years of age. ? If you are overweight and have a high risk for diabetes, consider being tested at a younger age or more often. Preventing infection Hepatitis B  If you have a higher risk for hepatitis B, you should be screened for this virus. You are considered at high risk for hepatitis B if: ? You were born in a country where hepatitis B is common. Ask your health care provider which countries are considered high risk. ? Your parents were born in a high-risk country, and you have not been immunized against hepatitis B (hepatitis B vaccine). ? You have HIV or AIDS. ? You use needles to inject street drugs. ? You live with someone who has hepatitis B. ? You have had sex with someone who has hepatitis B. ? You get hemodialysis treatment. ? You take certain medicines for conditions, including cancer, organ transplantation, and autoimmune conditions.  Hepatitis C  Blood testing is recommended for: ? Everyone born from [birth year] through 1967. ? Anyone with known risk factors for hepatitis C.  Sexually transmitted infections (STIs)  You should be screened for sexually transmitted infections (STIs) including gonorrhea and chlamydia if: ? You are sexually active and are younger than 69 years of age. ? You are older than 69 years of age and your health care provider tells you that you are at risk for this type of infection. ? Your sexual activity has changed since you were last screened and you are at an increased risk for chlamydia or gonorrhea. Ask your health care provider if you are at risk.  If you do not have HIV, but are at risk, it may be recommended  that you take a prescription medicine daily to prevent HIV infection. This is called pre-exposure prophylaxis (PrEP). You are considered at risk if: ? You are sexually active and do not regularly use condoms or know the HIV status of your partner(s). ? You take drugs by injection. ? You are sexually active with a partner who has HIV.  Talk with your health care provider about whether you are at high risk of being infected with HIV. If you choose to begin PrEP, you should first be  for HIV. You should then be tested every 3 months for as long as you are taking PrEP. Pregnancy  If you are premenopausal and you may become pregnant, ask your health care provider about preconception counseling.  If you may become pregnant, take 400 to 800 micrograms (mcg) of folic acid every day.  If you want to prevent pregnancy, talk to your health care provider about birth control (contraception). Osteoporosis and menopause  Osteoporosis is a disease in which the bones lose minerals and strength with aging. This can result in serious bone fractures. Your risk for osteoporosis can be identified using a bone density scan.  If you are 65 years of age or older, or if you are at risk for osteoporosis and fractures, ask your health care provider if you should be screened.  Ask your health care provider whether you should take a calcium or vitamin D supplement to lower your risk for osteoporosis.  Menopause may have certain physical symptoms and risks.  Hormone replacement therapy may reduce some of these symptoms and risks. Talk to your health care provider about whether hormone replacement therapy is right for you. Follow these instructions at home:  Schedule regular health, dental, and eye exams.  Stay current with your immunizations.  Do not use any tobacco products including cigarettes, chewing tobacco, or electronic cigarettes.  If you are pregnant, do not drink alcohol.  If you are  breastfeeding, limit how much and how often you drink alcohol.  Limit alcohol intake to no more than 1 drink per day for nonpregnant women. One drink equals 12 ounces of beer, 5 ounces of wine, or 1 ounces of hard liquor.  Do not use street drugs.  Do not share needles.  Ask your health care provider for help if you need support or information about quitting drugs.  Tell your health care provider if you often feel depressed.  Tell your health care provider if you have ever been abused or do not feel safe at home. This information is not intended to replace advice given to you by your health care provider. Make sure you discuss any questions you have with your health care provider. Document Released: 12/08/2010 Document Revised: 10/31/2015 Document Reviewed: 02/26/2015 Elsevier Interactive Patient Education  2018 [employer]  

## 2018-02-01 NOTE — Patient Instructions (Addendum)
  Test(s) ordered today. Your results will be released to MyChart (or called to you) after review, usually within 72hours after test completion. If any changes need to be made, you will be notified at that same time.  No immunizations administered today.   Medications reviewed and updated.  No changes recommended at this time.  Your prescription(s) have been submitted to your pharmacy. Please take as directed and contact our office if you believe you are having problem(s) with the medication(s).  A referral was ordered for orthopedics.    Please followup in 2 months

## 2018-02-01 NOTE — Assessment & Plan Note (Signed)
Chronic lower back pain without radiculopathy Following with pain management and they are prescribing her Norco-she will continue to see them She would like to see if injections would be helpful-we will refer to orthopedics for further evaluation for injections She would benefit from a heating pad and a Rollator with a seat-we will give her prescriptions Stressed more regular exercise and weight loss

## 2018-02-01 NOTE — Assessment & Plan Note (Signed)
Statin intolerant Check lipid panel, CMP Encourage more regular exercise and weight loss

## 2018-02-01 NOTE — Assessment & Plan Note (Signed)
Morbidly obese Stressed regular exercise Consider referral to nutrition Healthy diet, decrease portions Follow-up in 2 months

## 2018-02-03 ENCOUNTER — Other Ambulatory Visit: Payer: Self-pay | Admitting: Internal Medicine

## 2018-02-03 DIAGNOSIS — E1143 Type 2 diabetes mellitus with diabetic autonomic (poly)neuropathy: Secondary | ICD-10-CM

## 2018-02-03 MED ORDER — GLUCOSE BLOOD VI STRP: ORAL_STRIP | 12 refills | Status: DC

## 2018-02-08 ENCOUNTER — Other Ambulatory Visit: Payer: Self-pay | Admitting: Internal Medicine

## 2018-02-08 ENCOUNTER — Telehealth: Payer: Self-pay

## 2018-02-08 DIAGNOSIS — E1143 Type 2 diabetes mellitus with diabetic autonomic (poly)neuropathy: Secondary | ICD-10-CM

## 2018-02-08 MED ORDER — DAPAGLIFLOZIN PROPANEDIOL 10 MG PO TABS: 10.0000 mg | ORAL_TABLET | Freq: Every day | ORAL | 5 refills | Status: DC

## 2018-02-08 MED ORDER — METFORMIN HCL ER 500 MG PO TB24: 1000.0000 mg | ORAL_TABLET | Freq: Two times a day (BID) | ORAL | 5 refills | Status: DC

## 2018-02-08 MED ORDER — SITAGLIPTIN PHOSPHATE 100 MG PO TABS: 100.0000 mg | ORAL_TABLET | Freq: Every day | ORAL | 5 refills | Status: DC

## 2018-02-08 NOTE — Telephone Encounter (Signed)
Copied from CRM [id number]. Topic: General - Other >> Feb 08, 2018  9:48 AM Gaynelle Adu wrote: Reason for CRM: Patient stated she received a call from the office and didn't catch the name.  She is requesting a call back. Please advise >> Feb 08, 2018 11:32 AM Morphies, Hermine Messick wrote: Patient returned your called regarding her lab results.

## 2018-02-08 NOTE — Telephone Encounter (Signed)
Called pt back to clarify medications.

## 2018-02-17 ENCOUNTER — Telehealth: Payer: Self-pay | Admitting: Internal Medicine

## 2018-02-17 NOTE — Telephone Encounter (Signed)
Copied from CRM [id number]. Topic: Quick Communication - See Telephone Encounter >> Feb 17, 2018  9:42 AM Luanna Coleawoud, Jessica L wrote: CRM for notification. See Telephone encounter for: 02/17/18.pt called and stated that she would like some samples of linzess. Please advise pt states that she is out of medication and does not have enough. Pt states she has not had a bowel movement in almost a week.

## 2018-02-18 ENCOUNTER — Other Ambulatory Visit: Payer: Self-pay | Admitting: Internal Medicine

## 2018-02-18 NOTE — Telephone Encounter (Signed)
Spoke with pt to advise we do have linzess samples for her to pick up.

## 2018-02-20 ENCOUNTER — Other Ambulatory Visit: Payer: Self-pay | Admitting: Internal Medicine

## 2018-02-23 DIAGNOSIS — G894 Chronic pain syndrome: Secondary | ICD-10-CM | POA: Diagnosis not present

## 2018-02-23 DIAGNOSIS — M545 Low back pain: Secondary | ICD-10-CM | POA: Diagnosis not present

## 2018-02-23 DIAGNOSIS — M25569 Pain in unspecified knee: Secondary | ICD-10-CM | POA: Diagnosis not present

## 2018-02-23 DIAGNOSIS — Z79891 Long term (current) use of opiate analgesic: Secondary | ICD-10-CM | POA: Diagnosis not present

## 2018-03-08 ENCOUNTER — Ambulatory Visit: Payer: Medicare Other | Admitting: Skilled Nursing Facility1

## 2018-03-21 ENCOUNTER — Other Ambulatory Visit: Payer: Self-pay | Admitting: Internal Medicine

## 2018-03-23 DIAGNOSIS — M25569 Pain in unspecified knee: Secondary | ICD-10-CM | POA: Diagnosis not present

## 2018-03-23 DIAGNOSIS — G894 Chronic pain syndrome: Secondary | ICD-10-CM | POA: Diagnosis not present

## 2018-03-23 DIAGNOSIS — Z79891 Long term (current) use of opiate analgesic: Secondary | ICD-10-CM | POA: Diagnosis not present

## 2018-03-23 DIAGNOSIS — M545 Low back pain: Secondary | ICD-10-CM | POA: Diagnosis not present

## 2018-04-08 ENCOUNTER — Ambulatory Visit: Payer: Medicare Other | Admitting: Endocrinology

## 2018-04-08 DIAGNOSIS — Z0289 Encounter for other administrative examinations: Secondary | ICD-10-CM

## 2018-04-15 ENCOUNTER — Other Ambulatory Visit: Payer: Self-pay | Admitting: Internal Medicine

## 2018-05-14 ENCOUNTER — Other Ambulatory Visit: Payer: Self-pay | Admitting: Internal Medicine

## 2018-05-18 DIAGNOSIS — M25569 Pain in unspecified knee: Secondary | ICD-10-CM | POA: Diagnosis not present

## 2018-05-18 DIAGNOSIS — M545 Low back pain: Secondary | ICD-10-CM | POA: Diagnosis not present

## 2018-05-18 DIAGNOSIS — G894 Chronic pain syndrome: Secondary | ICD-10-CM | POA: Diagnosis not present

## 2018-05-18 DIAGNOSIS — Z79891 Long term (current) use of opiate analgesic: Secondary | ICD-10-CM | POA: Diagnosis not present

## 2018-06-09 ENCOUNTER — Other Ambulatory Visit: Payer: Self-pay | Admitting: Internal Medicine

## 2018-06-16 ENCOUNTER — Other Ambulatory Visit: Payer: Self-pay | Admitting: Internal Medicine

## 2018-06-16 NOTE — Telephone Encounter (Signed)
Appt made, pt needs refill

## 2018-06-16 NOTE — Telephone Encounter (Signed)
Copied from CRM [id number]. Topic: Quick Communication - Rx Refill/Question >> Jun 16, 2018  1:42 PM Mcneil, Ja-Kwan wrote: Medication: amLODipine (NORVASC) 10 MG tablet  Has the patient contacted their pharmacy? yes   Preferred Pharmacy (with phone number or street name): [facility] [phone] [place], [place] - [address] AT [place] [address] [phone] (Phone)  [phone] (Fax)  Agent: Please be advised that RX refills may take up to 3 business days. We ask that you follow-up with your pharmacy.

## 2018-06-16 NOTE — Telephone Encounter (Signed)
Requested medication (s) are due for refill today -yes  Requested medication (s) are on the active medication list -yes  Future visit scheduled -no  Last refill: 05/16/18  Notes to clinic: Rx request sent for review- notes state needs OV on last 30 day Rx - no OV has been scheduled.  Requested Prescriptions  Pending Prescriptions Disp Refills   amLODipine (NORVASC) 10 MG tablet 30 tablet 0    Sig: Take 1 tablet (10 mg total) by mouth daily. Needs office visit for more refills.     Cardiovascular:  Calcium Channel Blockers Passed - 06/16/2018  1:52 PM      Passed - Last BP in normal range    BP Readings from Last 1 Encounters:  02/01/18 132/80         Passed - Valid encounter within last 6 months    Recent Outpatient Visits          4 months ago Diabetic peripheral neuropathy (HCC)   [facility] -[place] Saa, Bobette Mo, MD   8 months ago Cough   [facility] -[place] Mans, Marye Round, MD   1 year ago Essential hypertension   [facility] -[place] Saa, Bobette Mo, MD   1 year ago PAD (peripheral artery disease) [facility])   [facility] -Clair Gulling, MD   1 year ago Acute non-recurrent pansinusitis   [facility] -Olive Bass, FNP              Requested Prescriptions  Pending Prescriptions Disp Refills   amLODipine (NORVASC) 10 MG tablet 30 tablet 0    Sig: Take 1 tablet (10 mg total) by mouth daily. Needs office visit for more refills.     Cardiovascular:  Calcium Channel Blockers Passed - 06/16/2018  1:52 PM      Passed - Last BP in normal range    BP Readings from Last 1 Encounters:  02/01/18 132/80         Passed - Valid encounter within last 6 months    Recent Outpatient Visits          4 months ago Diabetic peripheral neuropathy (HCC)   [facility] -[place] Saa, Bobette Mo, MD   8 months ago Cough   [facility] -[place] Mans, Marye Round, MD   1 year ago Essential hypertension   [facility] -[place] Saa, Bobette Mo, MD   1 year ago PAD (peripheral artery disease) [facility])   [facility] -Clair Gulling, MD   1 year ago Acute non-recurrent pansinusitis    [facility]live [facility] F[facility]

## 2018-06-17 MED ORDER — AMLODIPINE BESYLATE 10 MG PO TABS: 10.0000 mg | ORAL_TABLET | Freq: Every day | ORAL | 0 refills | Status: DC

## 2018-06-17 NOTE — Addendum Note (Signed)
Addended by: Mercer Pod E on: 06/17/2018 04:32 PM   Modules accepted: Orders

## 2018-06-27 NOTE — Progress Notes (Deleted)
Subjective:    Patient ID: Lindsay Hardy, female    DOB: November 13, 1950, 68 y.o.   MRN: [id number]  HPI The patient is here for follow up.  Diabetes: She is taking her medication daily as prescribed. She is compliant with a diabetic diet. She is exercising regularly. She monitors her sugars and they have been running XXX. She checks her feet daily and denies foot lesions. She is up-to-date with an ophthalmology examination.   Diabetic neuropathy:  Hypertension: She is taking her medication daily. She is compliant with a low sodium diet.  She denies chest pain, palpitations, edema, shortness of breath and regular headaches. She is exercising regularly.  She does not monitor her blood pressure at home.    Hyperlipidemia: She is taking her medication daily. She is compliant with a low fat/cholesterol diet. She is exercising regularly. She denies myalgias.   chornic lower back pain; s he is followiwnt with pain amangement.    Medications and allergies reviewed with patient and updated if appropriate.  Patient Active Problem List   Diagnosis Date Noted  . Morbid obesity ([place]) 04/01/2017  . PAD (peripheral artery disease) ([place]) 10/29/2016  . Chronic back pain 09/23/2016  . Diabetic peripheral neuropathy ([place]) 02/05/2016  . 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 Hyperlipidemia 06/17/2006  . Essential hypertension 06/17/2006  . Coronary atherosclerosis 06/17/2006  . DIASTOLIC DYSFUNCTION 04/59/9774  . DISEASE, CEREBROVASCULAR NEC 06/17/2006  . Osteoarthritis of left knee 06/17/2006    Current Outpatient Medications on File Prior to Visit  Medication Sig Dispense Refill  . albuterol (PROAIR HFA) 108 (90 Base) MCG/ACT inhaler INHALE 2 PUFFS INTO THE LUNGS EVERY 4 (FOUR) HOURS AS NEEDED FOR SHORTNESS OF BREATH. 8.5 Inhaler 5  . amLODipine (NORVASC) 10 MG tablet Take 1 tablet (10 mg total) by mouth daily. Needs office visit for more refills. 30  tablet 0  . aspirin 81 MG EC tablet TAKE 1 TABLET (81 MG TOTAL) BY MOUTH DAILY. 90 tablet 1  . Blood Glucose Monitoring Suppl (ONETOUCH VERIO IQ SYSTEM) w/Device KIT Use to check blood sugar once to twice daily. 1 kit 0  . fluticasone (FLONASE) 50 MCG/ACT nasal spray Place 2 sprays into both nostrils daily. 16 g 6  . glipiZIDE (GLUCOTROL) 10 MG tablet TAKE 1 TABLET BY MOUTH TWICE A DAY BEFORE A MEAL 60 tablet 5  . glucose blood (ACCU-CHEK GUIDE) test strip Use to check blood sugars twice daily. E11.43 100 each 12  . Heating Pads (HEATING PAD DRY HEAT) PADS Use as directed for back pain -M54.9 1 each 0  . hydrochlorothiazide (HYDRODIURIL) 25 MG tablet TAKE 1 TABLET BY MOUTH EVERY DAY 90 tablet 1  . HYDROcodone-acetaminophen (NORCO) 10-325 MG tablet Take 1 tablet by mouth every 6 (six) hours.  0  . ibuprofen (ADVIL,MOTRIN) 400 MG tablet TAKE 1 TABLET 3 TIMES DAILY.  0  . linaclotide (LINZESS) 145 MCG CAPS capsule Take 1 capsule (145 mcg total) by mouth every morning. 90 capsule 1  . metFORMIN (GLUCOPHAGE-XR) 500 MG 24 hr tablet Take 2 tablets (1,000 mg total) by mouth 2 (two) times daily before a meal. TAKE 2 TABLETS BY MOUTH EVERY DAY WITH BREAKFAST 120 tablet 5  . Misc. Devices (BARIATRIC ROLLATOR) MISC Use as directed.  Dx  M54.9 1 each 0  . nebivolol (BYSTOLIC) 5 MG tablet Take 1 tablet (5 mg total) by mouth daily. 90 tablet 1  . ONETOUCH VERIO test strip Use to check  blood sugar one to two times daily. 300 each 1  . potassium chloride (MICRO-K) 10 MEQ CR capsule TAKE ONE CAPSULE BY MOUTH TWICE A DAY 180 capsule 0  . sitaGLIPtin (JANUVIA) 100 MG tablet Take 1 tablet (100 mg total) by mouth daily. 30 tablet 5  . VOLTAREN 1 % GEL APPLY 2 GRAMS 3 TIMES DAILY.  0   No current facility-administered medications on file prior to visit.     Past Medical History:  Diagnosis Date  . Asthma   . CAD (coronary artery disease) 11/2003    RCA 60% stenosis, LAD diffuse  . Chicken pox   . CVA  (cerebrovascular accident) [facility]) 2001    right frontoparietal cortical CVA - MRI September 2001  . Diabetes mellitus   . Diastolic dysfunction     ejection fraction 55-65% on 2-D echo May 2006  . Empty sella syndrome [facility])     based on MRI September 2001, related to obesity, hypertension, it was determined that patient has not required treatment but will be monitored with TSH, ACTH, cortisol, testosterone, prolactin, growth hormone  . GERD (gastroesophageal reflux disease)     EGD June 2004 -  followed by Dr. Carlean Purl  . Granulomatous disease, chronic (HCC)     in right upper lobe per x-ray December 2005  . Hyperlipidemia   . Hypertension   . NAFLD (nonalcoholic fatty liver disease)     based on ultrasound June 2002, elevated transaminase  . OA (osteoarthritis)     DJP coracoclavicular ligament, left patella osteophytes June 2002, L4-S1 spondylosis, degenerative disc disease with bulge. I in October 2001, the C2-C4 spondylosis without stenosis and with spurs based on x-ray July 2005; diffuse idiopathic skeletal hyperostosis with bilateral hip lumbar degenerative changes   on x-ray February 2005, bilateral plantar calcaneal spurs  . Reactive airway disease     peak flow 180 in May 2005, chronic sinusitis with PND, bronchitis December 2002 and strep pneumonia April 2007,  FEC 66, MCV 60, ratio is 69, DLCO 57,  moderate restrictive and mild obstructive disease  on spirometry May 2005    Past Surgical History:  Procedure Laterality Date  . CARPAL TUNNEL RELEASE  1992    left arm 1992  . CHOLECYSTECTOMY  2003  . TONSILLECTOMY AND ADENOIDECTOMY      Social History   Socioeconomic History  . Marital status: Single    Spouse name: Not on file  . Number of children: 1  . Years of education: 58  . Highest education level: Not on file  Occupational History  . Occupation:  former Psychologist, counselling  Social Needs  . Financial resource strain: Not very hard  . Food insecurity:    Worry:  Never true    Inability: Never true  . Transportation needs:    Medical: No    Non-medical: No  Tobacco Use  . Smoking status: Former Smoker    Packs/day: 0.25    Years: 4.00    Pack years: 1.00    Types: Cigarettes  . Smokeless tobacco: Never Used  . Tobacco comment: smoked in 20's   Substance and Sexual Activity  . Alcohol use: No    Alcohol/week: 0.0 standard drinks  . Drug use: No  . Sexual activity: Not Currently    Birth control/protection: Post-menopausal  Lifestyle  . Physical activity:    Days per week: 0 days    Minutes per session: 0 min  . Stress: To some extent  Relationships  . Social  connections:    Talks on phone: More than three times a week    Gets together: More than three times a week    Attends religious service: More than 4 times per year    Active member of club or organization: Yes    Attends meetings of clubs or organizations: More than 4 times per year    Relationship status: Not on file  Other Topics Concern  . Not on file  Social History Narrative   Born and raised in [place], [place]. Currently lives in a private residence with her nephew and his wife. Fun: Church activities, spending time with family   Denies any religious beliefs that would effect health care.     Family History  Problem Relation Age of Onset  . Lung cancer Mother   . Hypertension Mother   . Lung cancer Sister   . Hypertension Sister     Review of Systems     Objective:  There were no vitals filed for this visit. BP Readings from Last 3 Encounters:  02/01/18 132/80  02/01/18 132/80  10/05/17 138/66   Wt Readings from Last 3 Encounters:  02/01/18 282 lb (127.9 kg)  02/01/18 282 lb (127.9 kg)  10/05/17 279 lb (126.6 kg)   There is no height or weight on file to calculate BMI.   Physical Exam    Constitutional: Appears well-developed and well-nourished. No distress.  HENT:  Head: Normocephalic and atraumatic.  Neck: Neck supple. No tracheal deviation  present. No thyromegaly present.  No cervical lymphadenopathy Cardiovascular: Normal rate, regular rhythm and normal heart sounds.   No murmur heard. No carotid bruit .  No edema Pulmonary/Chest: Effort normal and breath sounds normal. No respiratory distress. No has no wheezes. No rales.  Skin: Skin is warm and dry. Not diaphoretic.  Psychiatric: Normal mood and affect. Behavior is normal.      Assessment & Plan:    See Problem List for Assessment and Plan of chronic medical problems.

## 2018-06-28 ENCOUNTER — Ambulatory Visit: Payer: Medicare Other | Admitting: Internal Medicine

## 2018-06-28 NOTE — Progress Notes (Signed)
Subjective:    Patient ID: Lindsay Hardy, female    DOB: 11-Oct-1950, 68 y.o.   MRN: [id number]  HPI The patient is here for follow up.  Right ear - since last week she has had a sensation of wind blowing in her ear.  It is intermittent.  She denies ear pain.  There is some discomfort.  She denies fever/chills.   Diabetes: She is taking her medication daily as prescribed. She is compliant with a diabetic diet most of the time. She is not exercising regularly. She monitors her sugars and they have been running less than 200's.   neuropathy related to her diabetes her back:  Her legs are heavy.  She stopped the gabapentin because she is not sure if it was working or not.  Hypertension: She is taking her medication daily. She is compliant with a low sodium diet.  She denies chest pain, palpitations, shortness of breath and regular headaches. She is not exercising regularly.  She does not monitor her blood pressure at home.    Hyperlipidemia: She is taking her medication daily. She is compliant with a low fat/cholesterol diet. She is not exercising regularly.    chronic lower back pain;  she is following with pain management.  She is not able to stand long and can not walk long distances.  She needs a walker to help her walk long distances and to prevent falls. As we discussed at her last visit she would like a new heating pad as that has helped with her pain in the past as well.        Medications and allergies reviewed with patient and updated if appropriate.  Patient Active Problem List   Diagnosis Date Noted  . Morbid obesity ([place]) 04/01/2017  . PAD (peripheral artery disease) ([place]) 10/29/2016  . Chronic back pain 09/23/2016  . Diabetic peripheral neuropathy ([place]) 02/05/2016  . 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 Hyperlipidemia 06/17/2006  . Essential hypertension 06/17/2006  . Coronary atherosclerosis 06/17/2006  . DIASTOLIC  DYSFUNCTION 40/97/3532  . DISEASE, CEREBROVASCULAR NEC 06/17/2006  . Osteoarthritis of left knee 06/17/2006    Current Outpatient Medications on File Prior to Visit  Medication Sig Dispense Refill  . albuterol (PROAIR HFA) 108 (90 Base) MCG/ACT inhaler INHALE 2 PUFFS INTO THE LUNGS EVERY 4 (FOUR) HOURS AS NEEDED FOR SHORTNESS OF BREATH. 8.5 Inhaler 5  . amLODipine (NORVASC) 10 MG tablet Take 1 tablet (10 mg total) by mouth daily. Needs office visit for more refills. 30 tablet 0  . aspirin 81 MG EC tablet TAKE 1 TABLET (81 MG TOTAL) BY MOUTH DAILY. 90 tablet 1  . Blood Glucose Monitoring Suppl (ONETOUCH VERIO IQ SYSTEM) w/Device KIT Use to check blood sugar once to twice daily. 1 kit 0  . fluticasone (FLONASE) 50 MCG/ACT nasal spray Place 2 sprays into both nostrils daily. 16 g 6  . glipiZIDE (GLUCOTROL) 10 MG tablet TAKE 1 TABLET BY MOUTH TWICE A DAY BEFORE A MEAL 60 tablet 5  . glucose blood (ACCU-CHEK GUIDE) test strip Use to check blood sugars twice daily. E11.43 100 each 12  . Heating Pads (HEATING PAD DRY HEAT) PADS Use as directed for back pain -M54.9 1 each 0  . hydrochlorothiazide (HYDRODIURIL) 25 MG tablet TAKE 1 TABLET BY MOUTH EVERY DAY 90 tablet 1  . HYDROcodone-acetaminophen (NORCO) 10-325 MG tablet Take 1 tablet by mouth every 6 (six) hours.  0  . ibuprofen (ADVIL,MOTRIN) 400  MG tablet TAKE 1 TABLET 3 TIMES DAILY.  0  . linaclotide (LINZESS) 145 MCG CAPS capsule Take 1 capsule (145 mcg total) by mouth every morning. 90 capsule 1  . metFORMIN (GLUCOPHAGE-XR) 500 MG 24 hr tablet Take 2 tablets (1,000 mg total) by mouth 2 (two) times daily before a meal. TAKE 2 TABLETS BY MOUTH EVERY DAY WITH BREAKFAST 120 tablet 5  . Misc. Devices (BARIATRIC ROLLATOR) MISC Use as directed.  Dx  M54.9 1 each 0  . ONETOUCH VERIO test strip Use to check blood sugar one to two times daily. 300 each 1  . potassium chloride (MICRO-K) 10 MEQ CR capsule TAKE ONE CAPSULE BY MOUTH TWICE A DAY 180 capsule 0    . VOLTAREN 1 % GEL APPLY 2 GRAMS 3 TIMES DAILY.  0   No current facility-administered medications on file prior to visit.     Past Medical History:  Diagnosis Date  . Asthma   . CAD (coronary artery disease) 11/2003    RCA 60% stenosis, LAD diffuse  . Chicken pox   . CVA (cerebrovascular accident) [facility]) 2001    right frontoparietal cortical CVA - MRI September 2001  . Diabetes mellitus   . Diastolic dysfunction     ejection fraction 55-65% on 2-D echo May 2006  . Empty sella syndrome [facility])     based on MRI September 2001, related to obesity, hypertension, it was determined that patient has not required treatment but will be monitored with TSH, ACTH, cortisol, testosterone, prolactin, growth hormone  . GERD (gastroesophageal reflux disease)     EGD June 2004 -  followed by Dr. Carlean Purl  . Granulomatous disease, chronic (HCC)     in right upper lobe per x-ray December 2005  . Hyperlipidemia   . Hypertension   . NAFLD (nonalcoholic fatty liver disease)     based on ultrasound June 2002, elevated transaminase  . OA (osteoarthritis)     DJP coracoclavicular ligament, left patella osteophytes June 2002, L4-S1 spondylosis, degenerative disc disease with bulge. I in October 2001, the C2-C4 spondylosis without stenosis and with spurs based on x-ray July 2005; diffuse idiopathic skeletal hyperostosis with bilateral hip lumbar degenerative changes   on x-ray February 2005, bilateral plantar calcaneal spurs  . Reactive airway disease     peak flow 180 in May 2005, chronic sinusitis with PND, bronchitis December 2002 and strep pneumonia April 2007,  FEC 66, MCV 60, ratio is 69, DLCO 57,  moderate restrictive and mild obstructive disease  on spirometry May 2005    Past Surgical History:  Procedure Laterality Date  . CARPAL TUNNEL RELEASE  1992    left arm 1992  . CHOLECYSTECTOMY  2003  . TONSILLECTOMY AND ADENOIDECTOMY      Social History   Socioeconomic History  . Marital status:  Single    Spouse name: Not on file  . Number of children: 1  . Years of education: 45  . Highest education level: Not on file  Occupational History  . Occupation:  former Psychologist, counselling  Social Needs  . Financial resource strain: Not very hard  . Food insecurity:    Worry: Never true    Inability: Never true  . Transportation needs:    Medical: No    Non-medical: No  Tobacco Use  . Smoking status: Former Smoker    Packs/day: 0.25    Years: 4.00    Pack years: 1.00    Types: Cigarettes  . Smokeless tobacco: Never Used  .  Tobacco comment: smoked in 20's   Substance and Sexual Activity  . Alcohol use: No    Alcohol/week: 0.0 standard drinks  . Drug use: No  . Sexual activity: Not Currently    Birth control/protection: Post-menopausal  Lifestyle  . Physical activity:    Days per week: 0 days    Minutes per session: 0 min  . Stress: To some extent  Relationships  . Social connections:    Talks on phone: More than three times a week    Gets together: More than three times a week    Attends religious service: More than 4 times per year    Active member of club or organization: Yes    Attends meetings of clubs or organizations: More than 4 times per year    Relationship status: Not on file  Other Topics Concern  . Not on file  Social History Narrative   Born and raised in [place], [place]. Currently lives in a private residence with her nephew and his wife. Fun: Church activities, spending time with family   Denies any religious beliefs that would effect health care.     Family History  Problem Relation Age of Onset  . Lung cancer Mother   . Hypertension Mother   . Lung cancer Sister   . Hypertension Sister     Review of Systems  Constitutional: Negative for chills and fever.  HENT: Negative for ear pain.   Respiratory: Positive for wheezing (uses inhaler and it helps). Negative for cough and shortness of breath.   Cardiovascular: Positive for leg swelling (left  ankle intermittently). Negative for chest pain and palpitations.  Neurological: Positive for light-headedness (occ) and headaches (occasional).       Objective:   Vitals:   06/29/18 1258  BP: 128/62  Pulse: 99  Resp: 18  Temp: 98.4 F (36.9 C)  SpO2: 96%   BP Readings from Last 3 Encounters:  06/29/18 128/62  02/01/18 132/80  02/01/18 132/80   Wt Readings from Last 3 Encounters:  06/29/18 275 lb 6.4 oz (124.9 kg)  02/01/18 282 lb (127.9 kg)  02/01/18 282 lb (127.9 kg)   Body mass index is 45.83 kg/m.   Physical Exam    Constitutional: Appears well-developed and well-nourished. No distress.  HENT:  Head: Normocephalic and atraumatic.  B/l ear canals and TM's are normal.  Neck supple. No tracheal deviation present. No thyromegaly present.  No cervical lymphadenopathy Cardiovascular: Normal rate, regular rhythm and normal heart sounds.   No murmur heard. No carotid bruit .  No edema Pulmonary/Chest: Effort normal and breath sounds normal. No respiratory distress. No has no wheezes. No rales.  Skin: Skin is warm and dry. Not diaphoretic.  Psychiatric: Normal mood and affect. Behavior is normal.      Assessment & Plan:    See Problem List for Assessment and Plan of chronic medical problems.

## 2018-06-29 ENCOUNTER — Encounter: Payer: Self-pay | Admitting: Internal Medicine

## 2018-06-29 ENCOUNTER — Other Ambulatory Visit (INDEPENDENT_AMBULATORY_CARE_PROVIDER_SITE_OTHER): Payer: Medicare Other

## 2018-06-29 ENCOUNTER — Ambulatory Visit (INDEPENDENT_AMBULATORY_CARE_PROVIDER_SITE_OTHER): Payer: Medicare Other | Admitting: Internal Medicine

## 2018-06-29 VITALS — BP 128/62 | HR 99 | Temp 98.4°F | Resp 18 | Ht 65.0 in | Wt 275.4 lb

## 2018-06-29 DIAGNOSIS — E782 Mixed hyperlipidemia: Secondary | ICD-10-CM

## 2018-06-29 DIAGNOSIS — I10 Essential (primary) hypertension: Secondary | ICD-10-CM

## 2018-06-29 DIAGNOSIS — E1143 Type 2 diabetes mellitus with diabetic autonomic (poly)neuropathy: Secondary | ICD-10-CM

## 2018-06-29 DIAGNOSIS — E1165 Type 2 diabetes mellitus with hyperglycemia: Secondary | ICD-10-CM | POA: Diagnosis not present

## 2018-06-29 DIAGNOSIS — E1142 Type 2 diabetes mellitus with diabetic polyneuropathy: Secondary | ICD-10-CM | POA: Diagnosis not present

## 2018-06-29 LAB — LIPID PANEL
Cholesterol: 170 mg/dL (ref 0–200)
HDL: 38.7 mg/dL — ABNORMAL LOW (ref >39.00)
LDL Cholesterol: 108 mg/dL — ABNORMAL HIGH (ref 0–99)
NonHDL: 131.06
Total CHOL/HDL Ratio: 4
Triglycerides: 114 mg/dL (ref 0.0–149.0)
VLDL: 22.8 mg/dL (ref 0.0–40.0)

## 2018-06-29 LAB — CBC WITH DIFFERENTIAL/PLATELET
Basophils Absolute: 0 10*3/uL (ref 0.0–0.1)
Basophils Relative: 0.7 % (ref 0.0–3.0)
Eosinophils Absolute: 0.1 10*3/uL (ref 0.0–0.7)
Eosinophils Relative: 1.3 % (ref 0.0–5.0)
HCT: 42.7 % (ref 36.0–46.0)
Hemoglobin: 13.9 g/dL (ref 12.0–15.0)
Lymphocytes Relative: 31.8 % (ref 12.0–46.0)
Lymphs Abs: 1.5 10*3/uL (ref 0.7–4.0)
MCHC: 32.6 g/dL (ref 30.0–36.0)
MCV: 81.6 fl (ref 78.0–100.0)
Monocytes Absolute: 0.5 10*3/uL (ref 0.1–1.0)
Monocytes Relative: 10.9 % (ref 3.0–12.0)
Neutro Abs: 2.6 10*3/uL (ref 1.4–7.7)
Neutrophils Relative %: 55.3 % (ref 43.0–77.0)
Platelets: 247 10*3/uL (ref 150.0–400.0)
RBC: 5.23 Mil/uL — ABNORMAL HIGH (ref 3.87–5.11)
RDW: 14.1 % (ref 11.5–15.5)
WBC: 4.7 10*3/uL (ref 4.0–10.5)

## 2018-06-29 LAB — COMPREHENSIVE METABOLIC PANEL
ALT: 16 U/L (ref 0–35)
AST: 13 U/L (ref 0–37)
Albumin: 3.7 g/dL (ref 3.5–5.2)
Alkaline Phosphatase: 98 U/L (ref 39–117)
BUN: 12 mg/dL (ref 6–23)
CO2: 31 mEq/L (ref 19–32)
Calcium: 9.4 mg/dL (ref 8.4–10.5)
Chloride: 97 mEq/L (ref 96–112)
Creatinine, Ser: 0.8 mg/dL (ref 0.40–1.20)
GFR: 86.03 mL/min (ref >60.00)
Glucose, Bld: 460 mg/dL — ABNORMAL HIGH (ref 70–99)
Potassium: 3.8 mEq/L (ref 3.5–5.1)
Sodium: 134 mEq/L — ABNORMAL LOW (ref 135–145)
Total Bilirubin: 0.3 mg/dL (ref 0.2–1.2)
Total Protein: 7.1 g/dL (ref 6.0–8.3)

## 2018-06-29 LAB — HEMOGLOBIN A1C: Hgb A1c MFr Bld: 13.7 % — ABNORMAL HIGH (ref 4.6–6.5)

## 2018-06-29 MED ORDER — HYDROCHLOROTHIAZIDE 25 MG PO TABS: 25.0000 mg | ORAL_TABLET | Freq: Every day | ORAL | 1 refills | Status: DC

## 2018-06-29 MED ORDER — GLIPIZIDE 10 MG PO TABS: ORAL_TABLET | ORAL | 5 refills | Status: DC

## 2018-06-29 MED ORDER — METFORMIN HCL ER 500 MG PO TB24: 1000.0000 mg | ORAL_TABLET | Freq: Two times a day (BID) | ORAL | 5 refills | Status: DC

## 2018-06-29 MED ORDER — BARIATRIC ROLLATOR MISC: 0 refills | Status: DC

## 2018-06-29 MED ORDER — POTASSIUM CHLORIDE ER 10 MEQ PO CPCR: 10.0000 meq | ORAL_CAPSULE | Freq: Two times a day (BID) | ORAL | 1 refills | Status: DC

## 2018-06-29 MED ORDER — HEATING PAD DRY HEAT PADS: MEDICATED_PAD | 0 refills | Status: DC

## 2018-06-29 MED ORDER — AMLODIPINE BESYLATE 10 MG PO TABS: 10.0000 mg | ORAL_TABLET | Freq: Every day | ORAL | 1 refills | Status: DC

## 2018-06-29 NOTE — Assessment & Plan Note (Signed)
BP well controlled Current regimen effective and well tolerated Continue current medications at current doses cmp  

## 2018-06-29 NOTE — Patient Instructions (Addendum)

## 2018-06-29 NOTE — Assessment & Plan Note (Signed)
Intolerant to statins Check CMP, lipid panel Encouraged weight loss healthy diet

## 2018-06-29 NOTE — Assessment & Plan Note (Signed)
Not on gabapentin - did not help ? From back or diabetes

## 2018-06-29 NOTE — Assessment & Plan Note (Addendum)
uncontrolled Currently taking glipizide 10 mg daily and metformin 1000 mg twice daily Has been referred to endocrine and was last referred after her last visit here-has not gone Has not wanted to start insulin We will check A1c and again discuss what medication changes need to happen Stressed the importance of getting her sugars well controlled

## 2018-07-04 ENCOUNTER — Ambulatory Visit: Payer: Medicare Other

## 2018-07-05 DIAGNOSIS — M179 Osteoarthritis of knee, unspecified: Secondary | ICD-10-CM | POA: Diagnosis not present

## 2018-07-05 DIAGNOSIS — R2689 Other abnormalities of gait and mobility: Secondary | ICD-10-CM | POA: Diagnosis not present

## 2018-07-05 DIAGNOSIS — M5441 Lumbago with sciatica, right side: Secondary | ICD-10-CM | POA: Diagnosis not present

## 2018-07-05 DIAGNOSIS — M25569 Pain in unspecified knee: Secondary | ICD-10-CM | POA: Diagnosis not present

## 2018-07-05 DIAGNOSIS — M549 Dorsalgia, unspecified: Secondary | ICD-10-CM | POA: Diagnosis not present

## 2018-07-13 DIAGNOSIS — Z79891 Long term (current) use of opiate analgesic: Secondary | ICD-10-CM | POA: Diagnosis not present

## 2018-07-13 DIAGNOSIS — G894 Chronic pain syndrome: Secondary | ICD-10-CM | POA: Diagnosis not present

## 2018-07-13 DIAGNOSIS — M545 Low back pain: Secondary | ICD-10-CM | POA: Diagnosis not present

## 2018-07-13 DIAGNOSIS — M25569 Pain in unspecified knee: Secondary | ICD-10-CM | POA: Diagnosis not present

## 2018-07-21 ENCOUNTER — Telehealth: Payer: Self-pay | Admitting: Internal Medicine

## 2018-07-21 MED ORDER — LANCETS MISC: 2 refills | Status: DC

## 2018-07-21 NOTE — Telephone Encounter (Signed)
Spoke with pt to let her know lancets have been sent to pharmacy and a new meter put up front for pt to pick up.

## 2018-07-21 NOTE — Telephone Encounter (Signed)
Copied from CRM [id number]. Topic: Quick Communication - Rx Refill/Question >> Jul 21, 2018 10:54 AM Wyonia Hough E wrote: Medication:  Pt needs a call back from Dr. Lawerance Bach nurse about her diabetes needle Pt is not able to check her blood sugar and its been running high. She stated that the pharmacy said they called to get these sent in. / please advise when you send them  Has the patient contacted their pharmacy? Yes   Preferred Pharmacy (with phone number or street name): [facility] [phone] [place], [place] - [address] AT [place] [address] [phone] (Phone) [phone] (Fax)    Agent: Please be advised that RX refills may take up to 3 business days. We ask that you follow-up with your pharmacy.

## 2018-07-22 ENCOUNTER — Ambulatory Visit (INDEPENDENT_AMBULATORY_CARE_PROVIDER_SITE_OTHER): Payer: Medicare Other

## 2018-07-22 DIAGNOSIS — Z23 Encounter for immunization: Secondary | ICD-10-CM

## 2018-08-01 ENCOUNTER — Other Ambulatory Visit: Payer: Self-pay | Admitting: Internal Medicine

## 2018-08-10 ENCOUNTER — Other Ambulatory Visit: Payer: Self-pay

## 2018-08-10 MED ORDER — MONTELUKAST SODIUM 10 MG PO TABS: 10.0000 mg | ORAL_TABLET | Freq: Every day | ORAL | 0 refills | Status: DC

## 2018-09-07 DIAGNOSIS — M545 Low back pain: Secondary | ICD-10-CM | POA: Diagnosis not present

## 2018-09-07 DIAGNOSIS — M25569 Pain in unspecified knee: Secondary | ICD-10-CM | POA: Diagnosis not present

## 2018-09-07 DIAGNOSIS — G894 Chronic pain syndrome: Secondary | ICD-10-CM | POA: Diagnosis not present

## 2018-09-07 DIAGNOSIS — Z79891 Long term (current) use of opiate analgesic: Secondary | ICD-10-CM | POA: Diagnosis not present

## 2018-09-30 ENCOUNTER — Ambulatory Visit: Payer: Self-pay

## 2018-09-30 NOTE — Telephone Encounter (Signed)
Advised to go to ED.

## 2018-09-30 NOTE — Telephone Encounter (Signed)
Pt. Called to report swelling and tenderness of left ankle.  Denied any skin breakdown.  Reported she has had swelling in this ankle for about a year, and sometimes it is worse than other times.  Reported some improvement with elevation.  Voiced concerned about a blood clot.  Denied redness.  Reported the skin in the ankle region looks "a little darker", compared to the right ankle.  Denied any swelling or tenderness in the left leg/ calf region.  Denied swelling in right LE.  Stated sometimes her right ankle hurts, but not as bad as the left one.  Denied shortness of breath or chest pain.  Called FC; transferred pt. to the office to have appt. scheduled.  Agreed with plan.     Reason for Disposition . [1] Thigh, calf, or ankle swelling AND [2] only 1 side  Answer Assessment - Initial Assessment Questions 1. ONSET: "When did the swelling start?" (e.g., minutes, hours, days)     Ongoing for about one year 2. LOCATION: "What part of the leg is swollen?"  "Are both legs swollen or just one leg?"     Swelling of left ankle  3. SEVERITY: "How bad is the swelling?" (e.g., localized; mild, moderate, severe)  - Localized - small area of swelling localized to one leg  - MILD pedal edema - swelling limited to foot and ankle, pitting edema < 1/4 inch (6 mm) deep, rest and elevation eliminate most or all swelling  - MODERATE edema - swelling of lower leg to knee, pitting edema > 1/4 inch (6 mm) deep, rest and elevation only partially reduce swelling  - SEVERE edema - swelling extends above knee, facial or hand swelling present      Mild swelling.  4. REDNESS: "Does the swelling look red or infected?"    A little darker, but not red 5. PAIN: "Is the swelling painful to touch?" If so, ask: "How painful is it?"   (Scale 1-10; mild, moderate or severe)    Sore to touch; denied warmth 6. FEVER: "Do you have a fever?" If so, ask: "What is it, how was it measured, and when did it start?"     Denied 7. CAUSE:  "What do you think is causing the leg swelling?"     Have had the swelling awhile now; it varies with the swelling; if elevates, it decreases  8. MEDICAL HISTORY: "Do you have a history of heart failure, kidney disease, liver failure, or cancer?"     Denied CHF; positive for DM, HTN, and Asthma 9. RECURRENT SYMPTOM: "Have you had leg swelling before?" If so, ask: "When was the last time?" "What happened that time?"     Yes, has had this for about a year.  10. OTHER SYMPTOMS: "Do you have any other symptoms?" (e.g., chest pain, difficulty breathing)      C/o left ankle hurts due to the swelling; denied shortness of breath; denied chest pain 11. PREGNANCY: "Is there any chance you are pregnant?" "When was your last menstrual period?"      N/a  Protocols used: LEG SWELLING AND EDEMA-A-AH

## 2018-10-07 ENCOUNTER — Encounter: Payer: Self-pay | Admitting: Internal Medicine

## 2018-10-07 ENCOUNTER — Ambulatory Visit (INDEPENDENT_AMBULATORY_CARE_PROVIDER_SITE_OTHER): Payer: Medicare Other | Admitting: Internal Medicine

## 2018-10-07 DIAGNOSIS — K644 Residual hemorrhoidal skin tags: Secondary | ICD-10-CM

## 2018-10-07 DIAGNOSIS — K5909 Other constipation: Secondary | ICD-10-CM

## 2018-10-07 DIAGNOSIS — B379 Candidiasis, unspecified: Secondary | ICD-10-CM

## 2018-10-07 MED ORDER — HYDROCORTISONE (PERIANAL) 2.5 % EX CREA: 1.0000 "application " | TOPICAL_CREAM | Freq: Two times a day (BID) | CUTANEOUS | 5 refills | Status: DC

## 2018-10-07 MED ORDER — NYSTATIN 100000 UNIT/GM EX CREA: 1.0000 "application " | TOPICAL_CREAM | Freq: Two times a day (BID) | CUTANEOUS | 5 refills | Status: DC

## 2018-10-07 MED ORDER — AMLODIPINE BESYLATE 10 MG PO TABS: 10.0000 mg | ORAL_TABLET | Freq: Every day | ORAL | 1 refills | Status: DC

## 2018-10-07 MED ORDER — HYDROCORTISONE ACETATE 25 MG RE SUPP: 25.0000 mg | Freq: Two times a day (BID) | RECTAL | 5 refills | Status: DC

## 2018-10-07 NOTE — Progress Notes (Signed)
Virtual Visit via Video Note  I connected with Lindsay Hardy on 10/07/18 at  3:30 PM EDT by a video enabled telemedicine application and verified that I am speaking with the correct person using two identifiers.   I discussed the limitations of evaluation and management by telemedicine and the availability of in person appointments. The patient expressed understanding and agreed to proceed.  The patient is currently at home and I am in the office.    No referring provider.    History of Present Illness: This is an acute visit for hemorrhoids   Hemorrhoids, constipation: For the past 3-4 days her hemorrhoids have flared up.  She states she has had this in the past-she has had hemorrhoids since she gave birth.  She states they are swollen and very painful.  She is having difficulty using the bathroom and is constipated.  She does have chronic constipation and is taking her Linzess daily.  This typically works to keep her regular.  She denies any bleeding from the hemorrhoids or in the stool.  She is experiencing some lower abdominal discomfort related to the constipation.  She has a rash under her panus. It was itching and she scratached it and now there is a scratch there.  No is no discharge from the scratch.  She states she has had a similar rash under her breasts.  She was wondering if she could get a cream for this.      Review of Systems  Constitutional: Negative for fever.  Gastrointestinal: Positive for abdominal pain and constipation. Negative for blood in stool, diarrhea and nausea.  Skin: Positive for itching and rash.     Social History   Socioeconomic History  . Marital status: Single    Spouse name: Not on file  . Number of children: 1  . Years of education: 47  . Highest education level: Not on file  Occupational History  . Occupation:  former Agricultural engineer  Social Needs  . Financial resource strain: Not very hard  . Food insecurity:    Worry: Never true   Inability: Never true  . Transportation needs:    Medical: No    Non-medical: No  Tobacco Use  . Smoking status: Former Smoker    Packs/day: 0.25    Years: 4.00    Pack years: 1.00    Types: Cigarettes  . Smokeless tobacco: Never Used  . Tobacco comment: smoked in 20's   Substance and Sexual Activity  . Alcohol use: No    Alcohol/week: 0.0 standard drinks  . Drug use: No  . Sexual activity: Not Currently    Birth control/protection: Post-menopausal  Lifestyle  . Physical activity:    Days per week: 0 days    Minutes per session: 0 min  . Stress: To some extent  Relationships  . Social connections:    Talks on phone: More than three times a week    Gets together: More than three times a week    Attends religious service: More than 4 times per year    Active member of club or organization: Yes    Attends meetings of clubs or organizations: More than 4 times per year    Relationship status: Not on file  Other Topics Concern  . Not on file  Social History Narrative   Born and raised in [place], [place]. Currently lives in a private residence with her nephew and his wife. Fun: Church activities, spending time with family   Denies any religious  beliefs that would effect health care.      Observations/Objective: Appears well in NAD   Assessment and Plan:  See Problem List for Assessment and Plan of chronic medical problems.   Follow Up Instructions:    I discussed the assessment and treatment plan with the patient. The patient was provided an opportunity to ask questions and all were answered. The patient agreed with the plan and demonstrated an understanding of the instructions.   The patient was advised to call back or seek an in-person evaluation if the symptoms worsen or if the condition fails to improve as anticipated.  She will call if there is no improvement in her symptoms or if she has any questions.  Pincus SanesStacy J Nate Common, MD

## 2018-10-07 NOTE — Assessment & Plan Note (Signed)
Her hemorrhoids have flared likely related to constipation In the past she states she has used cream and suppositories I am unsure if she has internal hemorrhoids as well as external hemorrhoids, but we will use both cream and suppositories Advised warm Epson salt baths Stressed the importance of keeping her constipation controlled-can increase Linzess to 2 pills daily.  Discussed the importance of keeping stool soft She will call if there is no improvement

## 2018-10-07 NOTE — Assessment & Plan Note (Signed)
Taking Linzess 145 mcg daily-typically is effective, but currently is obstipated and having a hemorrhoid flare Increase Linzess to 290 mcg daily for a few days to see if that helps and then decrease back to 145 mcg daily May also need to use a stool softener Call if no improvement or with any questions

## 2018-10-07 NOTE — Assessment & Plan Note (Signed)
Rash under pannus is similar to what she has had under her breasts and it is red and itchy Likely yeast infection Nystatin cream

## 2018-10-12 ENCOUNTER — Telehealth: Payer: Self-pay

## 2018-10-12 MED ORDER — HYDROCORTISONE ACETATE 25 MG RE SUPP: 25.0000 mg | Freq: Two times a day (BID) | RECTAL | 5 refills | Status: DC

## 2018-10-12 NOTE — Addendum Note (Signed)
Addended by: Mercer Pod E on: 10/12/2018 04:06 PM   Modules accepted: Orders

## 2018-10-12 NOTE — Telephone Encounter (Signed)
Pt states that her hemorrhoids are some what better. They did shrink some with the cream but they are still there and she is still having issues. She was wondering if she was supposed to have suppositories sent in to her pharmacy when the hemorrhoid cream was sent. She said she did have 2 good bowel movements today but she is worried to push at all because she does not want to irritate her hemorrhoids.

## 2018-10-12 NOTE — Telephone Encounter (Signed)
LVM for pt to call back in regards.  

## 2018-10-12 NOTE — Telephone Encounter (Signed)
We can send in suppositories-those are for internal hemorrhoids, not not external.  She may have both.  Suppositories  were sent to her pharmacy 5/1-can refill if needed   She should avoid straining because that will aggravate the hemorrhoids.  She also wants to try to maintain softer stools.

## 2018-10-12 NOTE — Telephone Encounter (Signed)
LVM for pt to call back to give response below.

## 2018-10-12 NOTE — Telephone Encounter (Signed)
Copied from CRM [id number]. Topic: General - Other >> Oct 12, 2018 12:08 PM Trula Slade wrote: Reason for CRM:  Patient would like a return call concerning her hemorrhoid problems.  Dr. Lawerance Bach told her to report how she is doing.  She is still having serious problems.  Please advise.

## 2018-10-12 NOTE — Telephone Encounter (Signed)
Suppositories resent to pharmacy for patient to try

## 2018-10-13 ENCOUNTER — Telehealth: Payer: Self-pay | Admitting: *Deleted

## 2018-10-13 NOTE — Telephone Encounter (Signed)
Pt called stating her insurance does not cover the suppositories sent in. Pt is requesting something else be sent in for her. Please advise.

## 2018-10-13 NOTE — Telephone Encounter (Signed)
I cannot tell if anything else is covered.  There are not eating lot of suppositories for hemorrhoids.  She will need to call her pharmacy to see what is covered.

## 2018-10-13 NOTE — Telephone Encounter (Signed)
See other phone note

## 2018-10-13 NOTE — Telephone Encounter (Signed)
Copied from CRM [id number]. Topic: General - Other >> Oct 12, 2018  4:43 PM Mcneil, Ja-Kwan wrote: Reason for CRM: Pt stated her insurance will not cover the hydrocortisone (ANUSOL-HC) 25 MG suppository and she can not afford to pay $159 for it. Pt requests that a Rx for another medication be sent to her pharmacy.

## 2018-10-13 NOTE — Telephone Encounter (Signed)
Pt informed of below. Verbalized understanding. I advised her to call us back with what info she finds out.

## 2018-10-29 ENCOUNTER — Other Ambulatory Visit: Payer: Self-pay | Admitting: Internal Medicine

## 2018-11-02 DIAGNOSIS — G894 Chronic pain syndrome: Secondary | ICD-10-CM | POA: Diagnosis not present

## 2018-11-02 DIAGNOSIS — M545 Low back pain: Secondary | ICD-10-CM | POA: Diagnosis not present

## 2018-11-02 DIAGNOSIS — Z79891 Long term (current) use of opiate analgesic: Secondary | ICD-10-CM | POA: Diagnosis not present

## 2018-11-02 DIAGNOSIS — M25569 Pain in unspecified knee: Secondary | ICD-10-CM | POA: Diagnosis not present

## 2018-11-09 ENCOUNTER — Other Ambulatory Visit: Payer: Self-pay | Admitting: Internal Medicine

## 2018-11-22 ENCOUNTER — Encounter: Payer: Self-pay | Admitting: Internal Medicine

## 2018-11-22 ENCOUNTER — Ambulatory Visit (INDEPENDENT_AMBULATORY_CARE_PROVIDER_SITE_OTHER): Payer: Medicare Other | Admitting: Internal Medicine

## 2018-11-22 DIAGNOSIS — E1165 Type 2 diabetes mellitus with hyperglycemia: Secondary | ICD-10-CM | POA: Diagnosis not present

## 2018-11-22 DIAGNOSIS — J309 Allergic rhinitis, unspecified: Secondary | ICD-10-CM

## 2018-11-22 DIAGNOSIS — J019 Acute sinusitis, unspecified: Secondary | ICD-10-CM | POA: Insufficient documentation

## 2018-11-22 MED ORDER — AMOXICILLIN 500 MG PO CAPS: 1000.0000 mg | ORAL_CAPSULE | Freq: Two times a day (BID) | ORAL | 0 refills | Status: DC

## 2018-11-22 MED ORDER — PREDNISONE 10 MG PO TABS: ORAL_TABLET | ORAL | 0 refills | Status: DC

## 2018-11-22 NOTE — Assessment & Plan Note (Signed)
Mild to mod, for antibx course,  to f/u any worsening symptoms or concerns 

## 2018-11-22 NOTE — Progress Notes (Signed)
Patient ID: Lindsay Hardy, female   DOB: [birth year], 68 y.o.   MRN: [id number]  Virtual Visit via Video Note  I connected with Lindsay Hardy on 11/22/18 at  7:20 PM EDT by a video enabled telemedicine application and verified that I am speaking with the correct person using two identifiers.  Location: Patient: at home Provider: at office   I discussed the limitations of evaluation and management by telemedicine and the availability of in person appointments. The patient expressed understanding and agreed to proceed.  History of Present Illness:  Here with 2-3 days acute onset fever, facial pain, pressure, headache, general weakness and malaise, and greenish d/c, with mild ST and cough, but pt denies chest pain, wheezing, increased sob or doe, orthopnea, PND, increased LE swelling, palpitations, dizziness or syncope.  Does have several wks ongoing nasal allergy symptoms with clearish congestion, itch and sneezing, without fever, pain, ST, cough, swelling or wheezing.   Pt denies polydipsia, polyuria, or low sugar symptoms, and cbg earlier today at 177.       Observations/Objective: Alert, NAD, mild ill appaering, appropriate mood and affect, resps normal, cn 2-12 intact, moves all 4s, no visible rash or swelling  Lab Results  Component Value Date   WBC 4.7 06/29/2018   HGB 13.9 06/29/2018   HCT 42.7 06/29/2018   PLT 247.0 06/29/2018   GLUCOSE 460 (H) 06/29/2018   CHOL 170 06/29/2018   TRIG 114.0 06/29/2018   HDL 38.70 (L) 06/29/2018   LDLCALC 108 (H) 06/29/2018   ALT 16 06/29/2018   AST 13 06/29/2018   NA 134 (L) 06/29/2018   K 3.8 06/29/2018   CL 97 06/29/2018   CREATININE 0.80 06/29/2018   BUN 12 06/29/2018   CO2 31 06/29/2018   TSH 1.62 04/01/2017   HGBA1C 13.7 (H) 06/29/2018   MICROALBUR 4.6 (H) 02/01/2018   Assessment and Plan: See notes  Follow Up Instructions: See notes   I discussed the assessment and treatment plan with the patient. The patient was provided an  opportunity to ask questions and all were answered. The patient agreed with the plan and demonstrated an understanding of the instructions.   The patient was advised to call back or seek an in-person evaluation if the symptoms worsen or if the condition fails to improve as anticipated.   Cathlean Cower, MD

## 2018-11-22 NOTE — Patient Instructions (Signed)
Please take all new medication as prescribed - the antibiotic, and prednisone  Please continue all other medications as before, and refills have been done if requested.  Please have the pharmacy call with any other refills you may need.  Please continue your efforts at being more active, low cholesterol diet, and weight control.  Please keep your appointments with your specialists as you may have planned   

## 2018-11-22 NOTE — Assessment & Plan Note (Signed)
stable overall by history and exam, recent data reviewed with pt, and pt to continue medical treatment as before,  to f/u any worsening symptoms or concerns, continue to monitor closely with prednisone tx

## 2018-11-22 NOTE — Assessment & Plan Note (Signed)
/  Mild to mod, for predpac asd,  to f/u any worsening symptoms or concerns 

## 2018-12-28 ENCOUNTER — Ambulatory Visit: Payer: Medicare Other | Admitting: Internal Medicine

## 2018-12-28 DIAGNOSIS — Z79891 Long term (current) use of opiate analgesic: Secondary | ICD-10-CM | POA: Diagnosis not present

## 2018-12-28 DIAGNOSIS — M25569 Pain in unspecified knee: Secondary | ICD-10-CM | POA: Diagnosis not present

## 2018-12-28 DIAGNOSIS — G894 Chronic pain syndrome: Secondary | ICD-10-CM | POA: Diagnosis not present

## 2018-12-28 DIAGNOSIS — M545 Low back pain: Secondary | ICD-10-CM | POA: Diagnosis not present

## 2018-12-30 ENCOUNTER — Other Ambulatory Visit: Payer: Self-pay | Admitting: Internal Medicine

## 2019-01-01 ENCOUNTER — Other Ambulatory Visit: Payer: Self-pay | Admitting: Internal Medicine

## 2019-01-01 NOTE — Progress Notes (Deleted)
Subjective:    Patient ID: Lindsay Hardy, female    DOB: [birth year], 68 y.o.   MRN: [id number]  HPI The patient is here for follow up.  Diabetes: She is taking her medication daily as prescribed. She is compliant with a diabetic diet.  She monitors her sugars and they have been running XXX. She denies numbness/tingling in her feet and foot lesions. She is up-to-date with an ophthalmology examination.   Neuropathy:   Hypertension: She is taking her medication daily. She is compliant with a low sodium diet.  She denies chest pain, palpitations, edema, shortness of breath and regular headaches. She does not monitor her blood pressure at home.    Hyperlipidemia: She is taking her medication daily. She is compliant with a low fat/cholesterol diet. She denies myalgias.   Chronic lower back pain:  She follows with pain management.    Medications and allergies reviewed with patient and updated if appropriate.  Patient Active Problem List   Diagnosis Date Noted  . Acute sinus infection 11/22/2018  . Allergic rhinitis 11/22/2018  . External hemorrhoids 10/07/2018  . Candidiasis 10/07/2018  . Chronic constipation 10/07/2018  . Morbid obesity ([place]) 04/01/2017  . PAD (peripheral artery disease) ([place]) 10/29/2016  . Chronic back pain 09/23/2016  . Diabetic peripheral neuropathy ([facility]) 02/05/2016  . Uncontrolled diabetes mellitus ([place]) 06/17/2006  . Hyperlipidemia 06/17/2006  . Essential hypertension 06/17/2006  . Coronary atherosclerosis 06/17/2006  . DIASTOLIC DYSFUNCTION 50/53/9767  . DISEASE, CEREBROVASCULAR NEC 06/17/2006  . Osteoarthritis of left knee 06/17/2006    Current Outpatient Medications on File Prior to Visit  Medication Sig Dispense Refill  . albuterol (PROAIR HFA) 108 (90 Base) MCG/ACT inhaler INHALE 2 PUFFS INTO THE LUNGS EVERY 4 (FOUR) HOURS AS NEEDED FOR SHORTNESS OF BREATH. 8.5 Inhaler 1  . amLODipine (NORVASC) 10 MG tablet Take 1 tablet (10 mg total) by mouth  daily. 90 tablet 1  . amoxicillin (AMOXIL) 500 MG capsule Take 2 capsules (1,000 mg total) by mouth 2 (two) times daily. 40 capsule 0  . aspirin 81 MG EC tablet TAKE 1 TABLET (81 MG TOTAL) BY MOUTH DAILY. 90 tablet 1  . Blood Glucose Monitoring Suppl (ONETOUCH VERIO IQ SYSTEM) w/Device KIT Use to check blood sugar once to twice daily. 1 kit 0  . fluticasone (FLONASE) 50 MCG/ACT nasal spray Place 2 sprays into both nostrils daily. 16 g 6  . glipiZIDE (GLUCOTROL) 10 MG tablet TAKE 1 TABLET BY MOUTH TWICE A DAY BEFORE A MEAL 60 tablet 5  . glucose blood (ACCU-CHEK GUIDE) test strip Use to check blood sugars twice daily. E11.43 100 each 12  . Heating Pads (HEATING PAD DRY HEAT) PADS Use as directed for back pain -M54.9 1 each 0  . hydrochlorothiazide (HYDRODIURIL) 25 MG tablet Take 1 tablet (25 mg total) by mouth daily. 90 tablet 1  . HYDROcodone-acetaminophen (NORCO) 10-325 MG tablet Take 1 tablet by mouth every 6 (six) hours.  0  . hydrocortisone (ANUSOL-HC) 2.5 % rectal cream Place 1 application rectally 2 (two) times daily. 30 g 5  . ibuprofen (ADVIL,MOTRIN) 400 MG tablet TAKE 1 TABLET 3 TIMES DAILY.  0  . Lancets MISC Use to check blood sugars 3 times daily. E11.65 300 each 2  . LINZESS 145 MCG CAPS capsule TAKE 1 CAPSULE (145 MCG TOTAL) BY MOUTH EVERY MORNING. 90 capsule 1  . metFORMIN (GLUCOPHAGE-XR) 500 MG 24 hr tablet Take 2 tablets (1,000 mg total) by mouth 2 (two) times daily  before a meal. TAKE 2 TABLETS BY MOUTH EVERY DAY WITH BREAKFAST 120 tablet 5  . Misc. Devices (BARIATRIC ROLLATOR) MISC Use as directed.  Dx  M54.9 1 each 0  . montelukast (SINGULAIR) 10 MG tablet TAKE 1 TABLET BY MOUTH EVERYDAY AT BEDTIME 90 tablet 0  . nystatin cream (MYCOSTATIN) Apply 1 application topically 2 (two) times daily. For stomach or under breasts 30 g 5  . ONETOUCH VERIO test strip Use to check blood sugar one to two times daily. 300 each 1  . potassium chloride (MICRO-K) 10 MEQ CR capsule Take 1 capsule  (10 mEq total) by mouth 2 (two) times daily. 180 capsule 1  . predniSONE (DELTASONE) 10 MG tablet 3 tabs by mouth per day for 3 days, 2tabs per day for 3 days,1tab per day for 3 days 18 tablet 0  . VOLTAREN 1 % GEL APPLY 2 GRAMS 3 TIMES DAILY.  0   No current facility-administered medications on file prior to visit.     Past Medical History:  Diagnosis Date  . Asthma   . CAD (coronary artery disease) 11/2003    RCA 60% stenosis, LAD diffuse  . Chicken pox   . CVA (cerebrovascular accident) [facility]) 2001    right frontoparietal cortical CVA - MRI September 2001  . Diabetes mellitus   . Diastolic dysfunction     ejection fraction 55-65% on 2-D echo May 2006  . Empty sella syndrome [facility])     based on MRI September 2001, related to obesity, hypertension, it was determined that patient has not required treatment but will be monitored with TSH, ACTH, cortisol, testosterone, prolactin, growth hormone  . GERD (gastroesophageal reflux disease)     EGD June 2004 -  followed by Dr. Carlean Purl  . Granulomatous disease, chronic (HCC)     in right upper lobe per x-ray December 2005  . Hyperlipidemia   . Hypertension   . NAFLD (nonalcoholic fatty liver disease)     based on ultrasound June 2002, elevated transaminase  . OA (osteoarthritis)     DJP coracoclavicular ligament, left patella osteophytes June 2002, L4-S1 spondylosis, degenerative disc disease with bulge. I in October 2001, the C2-C4 spondylosis without stenosis and with spurs based on x-ray July 2005; diffuse idiopathic skeletal hyperostosis with bilateral hip lumbar degenerative changes   on x-ray February 2005, bilateral plantar calcaneal spurs  . Reactive airway disease     peak flow 180 in May 2005, chronic sinusitis with PND, bronchitis December 2002 and strep pneumonia April 2007,  FEC 66, MCV 60, ratio is 69, DLCO 57,  moderate restrictive and mild obstructive disease  on spirometry May 2005    Past Surgical History:  Procedure  Laterality Date  . CARPAL TUNNEL RELEASE  1992    left arm 1992  . CHOLECYSTECTOMY  2003  . TONSILLECTOMY AND ADENOIDECTOMY      Social History   Socioeconomic History  . Marital status: Single    Spouse name: Not on file  . Number of children: 1  . Years of education: 5  . Highest education level: Not on file  Occupational History  . Occupation:  former Psychologist, counselling  Social Needs  . Financial resource strain: Not very hard  . Food insecurity    Worry: Never true    Inability: Never true  . Transportation needs    Medical: No    Non-medical: No  Tobacco Use  . Smoking status: Former Smoker    Packs/day: 0.25  Years: 4.00    Pack years: 1.00    Types: Cigarettes  . Smokeless tobacco: Never Used  . Tobacco comment: smoked in 20's   Substance and Sexual Activity  . Alcohol use: No    Alcohol/week: 0.0 standard drinks  . Drug use: No  . Sexual activity: Not Currently    Birth control/protection: Post-menopausal  Lifestyle  . Physical activity    Days per week: 0 days    Minutes per session: 0 min  . Stress: To some extent  Relationships  . Social connections    Talks on phone: More than three times a week    Gets together: More than three times a week    Attends religious service: More than 4 times per year    Active member of club or organization: Yes    Attends meetings of clubs or organizations: More than 4 times per year    Relationship status: Not on file  Other Topics Concern  . Not on file  Social History Narrative   Born and raised in [place], [place]. Currently lives in a private residence with her nephew and his wife. Fun: Church activities, spending time with family   Denies any religious beliefs that would effect health care.     Family History  Problem Relation Age of Onset  . Lung cancer Mother   . Hypertension Mother   . Lung cancer Sister   . Hypertension Sister     Review of Systems     Objective:  There were no vitals filed  for this visit. BP Readings from Last 3 Encounters:  06/29/18 128/62  02/01/18 132/80  02/01/18 132/80   Wt Readings from Last 3 Encounters:  06/29/18 275 lb 6.4 oz (124.9 kg)  02/01/18 282 lb (127.9 kg)  02/01/18 282 lb (127.9 kg)   There is no height or weight on file to calculate BMI.   Physical Exam    Constitutional: Appears well-developed and well-nourished. No distress.  HENT:  Head: Normocephalic and atraumatic.  Neck: Neck supple. No tracheal deviation present. No thyromegaly present.  No cervical lymphadenopathy Cardiovascular: Normal rate, regular rhythm and normal heart sounds.   No murmur heard. No carotid bruit .  No edema Pulmonary/Chest: Effort normal and breath sounds normal. No respiratory distress. No has no wheezes. No rales.  Skin: Skin is warm and dry. Not diaphoretic.  Psychiatric: Normal mood and affect. Behavior is normal.      Assessment & Plan:    See Problem List for Assessment and Plan of chronic medical problems.

## 2019-01-02 ENCOUNTER — Ambulatory Visit: Payer: Medicare Other | Admitting: Internal Medicine

## 2019-01-03 NOTE — Progress Notes (Signed)
Virtual Visit via Video Note  I connected with Lindsay Hardy on 01/04/19 at  2:15 PM EDT by a video enabled telemedicine application and verified that I am speaking with the correct person using two identifiers.   I discussed the limitations of evaluation and management by telemedicine and the availability of in person appointments. The patient expressed understanding and agreed to proceed.  The patient is currently at home and I am in the office.    No referring provider.    History of Present Illness: She is here for follow up of her chronic medical conditions.    She is exercising regularly.     Diabetes: She is taking her medication daily as prescribed. She has been more compliant with a diabetic diet.  She monitors her sugars and they have been running 140- 150's. She denies numbness/tingling in her feet and foot lesions.    Neuropathy: It is about same  - she has chronic pain.    Hypertension: She is taking her medication daily. She is compliant with a low sodium diet.  She denies chest pain, palpitations, edema, shortness of breath and regular headaches.    Hyperlipidemia: She is taking her medication daily. She is compliant with a low fat/cholesterol diet. She denies myalgias.   Chronic lower back pain:  She follows with pain management.    Review of Systems  Constitutional: Negative for chills and fever.  Respiratory: Positive for wheezing (occ with wheezing). Negative for cough and shortness of breath.   Cardiovascular: Negative for chest pain, palpitations and leg swelling.  Musculoskeletal: Positive for back pain. Negative for myalgias.  Neurological: Positive for headaches (occ - sinus). Negative for tingling.     Social History   Socioeconomic History  . Marital status: Single    Spouse name: Not on file  . Number of children: 1  . Years of education: 5014  . Highest education level: Not on file  Occupational History  . Occupation:  former Agricultural engineernursing assistant   Social Needs  . Financial resource strain: Not very hard  . Food insecurity    Worry: Never true    Inability: Never true  . Transportation needs    Medical: No    Non-medical: No  Tobacco Use  . Smoking status: Former Smoker    Packs/day: 0.25    Years: 4.00    Pack years: 1.00    Types: Cigarettes  . Smokeless tobacco: Never Used  . Tobacco comment: smoked in 20's   Substance and Sexual Activity  . Alcohol use: No    Alcohol/week: 0.0 standard drinks  . Drug use: No  . Sexual activity: Not Currently    Birth control/protection: Post-menopausal  Lifestyle  . Physical activity    Days per week: 0 days    Minutes per session: 0 min  . Stress: To some extent  Relationships  . Social connections    Talks on phone: More than three times a week    Gets together: More than three times a week    Attends religious service: More than 4 times per year    Active member of club or organization: Yes    Attends meetings of clubs or organizations: More than 4 times per year    Relationship status: Not on file  Other Topics Concern  . Not on file  Social History Narrative   [place] Currently lives in a private residence with her nephew and his wife. Fun: Church activities, spending  time with family   Denies any religious beliefs that would effect health care.      Observations/Objective: Appears well in NAD   Assessment and Plan:  See Problem List for Assessment and Plan of chronic medical problems.   Follow Up Instructions:    I discussed the assessment and treatment plan with the patient. The patient was provided an opportunity to ask questions and all were answered. The patient agreed with the plan and demonstrated an understanding of the instructions.   The patient was advised to call back or seek an in-person evaluation if the symptoms worsen or if the condition fails to improve as anticipated.  Blood work soon - ordered.   F/u in 3  months  Binnie Rail, MD

## 2019-01-04 ENCOUNTER — Encounter: Payer: Self-pay | Admitting: Internal Medicine

## 2019-01-04 ENCOUNTER — Ambulatory Visit (INDEPENDENT_AMBULATORY_CARE_PROVIDER_SITE_OTHER): Payer: Medicare Other | Admitting: Internal Medicine

## 2019-01-04 DIAGNOSIS — I10 Essential (primary) hypertension: Secondary | ICD-10-CM | POA: Diagnosis not present

## 2019-01-04 DIAGNOSIS — M5442 Lumbago with sciatica, left side: Secondary | ICD-10-CM

## 2019-01-04 DIAGNOSIS — E1165 Type 2 diabetes mellitus with hyperglycemia: Secondary | ICD-10-CM | POA: Diagnosis not present

## 2019-01-04 DIAGNOSIS — E782 Mixed hyperlipidemia: Secondary | ICD-10-CM | POA: Diagnosis not present

## 2019-01-04 DIAGNOSIS — G8929 Other chronic pain: Secondary | ICD-10-CM

## 2019-01-04 DIAGNOSIS — E1142 Type 2 diabetes mellitus with diabetic polyneuropathy: Secondary | ICD-10-CM | POA: Diagnosis not present

## 2019-01-04 DIAGNOSIS — M5441 Lumbago with sciatica, right side: Secondary | ICD-10-CM | POA: Diagnosis not present

## 2019-01-04 MED ORDER — HYDROCHLOROTHIAZIDE 25 MG PO TABS: 25.0000 mg | ORAL_TABLET | Freq: Every day | ORAL | 1 refills | Status: DC

## 2019-01-04 MED ORDER — GLIPIZIDE 10 MG PO TABS: ORAL_TABLET | ORAL | 1 refills | Status: DC

## 2019-01-04 MED ORDER — AMLODIPINE BESYLATE 10 MG PO TABS: 10.0000 mg | ORAL_TABLET | Freq: Every day | ORAL | 1 refills | Status: DC

## 2019-01-04 MED ORDER — POTASSIUM CHLORIDE ER 10 MEQ PO CPCR: ORAL_CAPSULE | ORAL | 1 refills | Status: DC

## 2019-01-04 MED ORDER — ALBUTEROL SULFATE HFA 108 (90 BASE) MCG/ACT IN AERS: INHALATION_SPRAY | RESPIRATORY_TRACT | 11 refills | Status: DC

## 2019-01-04 MED ORDER — METFORMIN HCL ER 500 MG PO TB24: ORAL_TABLET | ORAL | 1 refills | Status: DC

## 2019-01-04 MED ORDER — MONTELUKAST SODIUM 10 MG PO TABS: ORAL_TABLET | ORAL | 1 refills | Status: DC

## 2019-01-04 NOTE — Assessment & Plan Note (Signed)
BP Readings from Last 3 Encounters:  06/29/18 128/62  02/01/18 132/80  02/01/18 132/80   BP well controlled Current regimen effective and well tolerated Continue current medications at current doses cmp

## 2019-01-04 NOTE — Assessment & Plan Note (Signed)
Unchanged Ck B12 level Stressed better control of her sugars

## 2019-01-04 NOTE — Assessment & Plan Note (Signed)
Following with pain management

## 2019-01-04 NOTE — Assessment & Plan Note (Signed)
Statin intolerant Ck lipids, cmp

## 2019-01-04 NOTE — Assessment & Plan Note (Signed)
States sugars are 140-150 at home Sugars very uncontrolled 6 months ago a1c She is more compliant with a low sugar/carb diet, no exercise Stressed getting control of sugars F/u in 3 months

## 2019-01-18 ENCOUNTER — Other Ambulatory Visit: Payer: Self-pay | Admitting: Internal Medicine

## 2019-01-25 ENCOUNTER — Other Ambulatory Visit: Payer: Self-pay

## 2019-01-25 MED ORDER — LINACLOTIDE 145 MCG PO CAPS: ORAL_CAPSULE | ORAL | 1 refills | Status: DC

## 2019-02-04 ENCOUNTER — Other Ambulatory Visit: Payer: Self-pay | Admitting: Internal Medicine

## 2019-02-15 ENCOUNTER — Telehealth: Payer: Self-pay | Admitting: Internal Medicine

## 2019-02-15 NOTE — Telephone Encounter (Signed)
Patient requesting refill on ibuprofen to be sent to [facility] on [address].

## 2019-02-16 MED ORDER — IBUPROFEN 400 MG PO TABS: 400.0000 mg | ORAL_TABLET | Freq: Every day | ORAL | 2 refills | Status: DC | PRN

## 2019-02-16 NOTE — Telephone Encounter (Signed)
Are you ok with sending this in. Does not look like you prescribed. Just wanted to make sure with liver and kidneys if this is ok with you

## 2019-02-16 NOTE — Telephone Encounter (Signed)
Pt states she takes it as needed. She does not take much for her bilateral knee and back pain. Would you recommend something else? Tylenol does not work.

## 2019-02-16 NOTE — Telephone Encounter (Signed)
I would not recommend taking ibuprofen on a regular basis due to it increasing BP, being bad for her heart and kidneys and increasing her risk of stomach ulcers.

## 2019-02-16 NOTE — Telephone Encounter (Signed)
Ok - I will order but she needs to take it infrequently - once daily at most if she can.  Sent to [facility].

## 2019-02-17 NOTE — Telephone Encounter (Signed)
Pt aware and expressed understanding.  

## 2019-02-23 DIAGNOSIS — G894 Chronic pain syndrome: Secondary | ICD-10-CM | POA: Diagnosis not present

## 2019-02-23 DIAGNOSIS — Z79891 Long term (current) use of opiate analgesic: Secondary | ICD-10-CM | POA: Diagnosis not present

## 2019-02-23 DIAGNOSIS — M545 Low back pain: Secondary | ICD-10-CM | POA: Diagnosis not present

## 2019-02-23 DIAGNOSIS — M25569 Pain in unspecified knee: Secondary | ICD-10-CM | POA: Diagnosis not present

## 2019-03-26 ENCOUNTER — Other Ambulatory Visit: Payer: Self-pay | Admitting: Internal Medicine

## 2019-04-01 ENCOUNTER — Telehealth: Payer: Self-pay

## 2019-04-01 ENCOUNTER — Ambulatory Visit: Payer: Medicare Other

## 2019-04-01 NOTE — Telephone Encounter (Signed)
Pt call to cancel flu shot at [facility] call was transferred  to my phone [place]. Pt states she has diarrhea and did not want to come to office.

## 2019-04-04 NOTE — Progress Notes (Signed)
Virtual Visit via Video Note  I connected with Lindsay Hardy on 04/05/19 at  3:00 PM EDT by a video enabled telemedicine application and verified that I am speaking with the correct person using two identifiers.   I discussed the limitations of evaluation and management by telemedicine and the availability of in person appointments. The patient expressed understanding and agreed to proceed.  The patient is currently at home and I am in the office.    No referring provider.    History of Present Illness: She is here for follow up of her chronic medical conditions.    She is not exercising regularly.    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 She is not compliant with a diabetic diet.  She monitors her sugars and they have been running 180's. She has numbness/tingling in her feet. She checks her feel daily.    Neuropathy:  Her left leg is swelling.  She is having a funny feeling in her feet - tingling, numbness.    Hypertension: She is taking her medication daily. She is compliant with a low sodium diet.  She denies chest pain, palpitations,  shortness of breath and regular headaches. She does monitor her blood pressure at home - 130/82.    Hyperlipidemia: She is not on medication due to being statin intolerant. She is compliant with a low fat/cholesterol diet.   Chronic back pain:  She is following with pain management.  She feels her back pain is controlled.      Review of Systems  Constitutional: Negative for chills and fever.  Respiratory: Negative for cough, shortness of breath and wheezing.   Cardiovascular: Positive for leg swelling. Negative for chest pain and palpitations.  Neurological: Positive for tingling. Negative for dizziness and headaches.      Social History   Socioeconomic History  . Marital status: Single    Spouse name: Not on file  . Number of children: 1  . Years of education: 3  . Highest education level: Not on  file  Occupational History  . Occupation:  former Psychologist, counselling  Social Needs  . Financial resource strain: Not very hard  . Food insecurity    Worry: Never true    Inability: Never true  . Transportation needs    Medical: No    Non-medical: No  Tobacco Use  . Smoking status: Former Smoker    Packs/day: 0.25    Years: 4.00    Pack years: 1.00    Types: Cigarettes  . Smokeless tobacco: Never Used  . Tobacco comment: smoked in 20's   Substance and Sexual Activity  . Alcohol use: No    Alcohol/week: 0.0 standard drinks  . Drug use: No  . Sexual activity: Not Currently    Birth control/protection: Post-menopausal  Lifestyle  . Physical activity    Days per week: 0 days    Minutes per session: 0 min  . Stress: To some extent  Relationships  . Social connections    Talks on phone: More than three times a week    Gets together: More than three times a week    Attends religious service: More than 4 times per year    Active member of club or organization: Yes    Attends meetings of clubs or organizations: More than 4 times per year    Relationship status: Not on file  Other Topics Concern  . Not on file  Social History Narrative   Born and  raised in [place], [place]. Currently lives in a private residence with her nephew and his wife. Fun: Church activities, spending time with family   Denies any religious beliefs that would effect health care.      Observations/Objective: Appears well in NAD Breathing normally  Assessment and Plan:  See Problem List for Assessment and Plan of chronic medical problems.   Follow Up Instructions:    I discussed the assessment and treatment plan with the patient. The patient was provided an opportunity to ask questions and all were answered. The patient agreed with the plan and demonstrated an understanding of the instructions.   The patient was advised to call back or seek an in-person evaluation if the symptoms worsen or if the  condition fails to improve as anticipated.  FU in 6 months.  Pincus Sanes, MD

## 2019-04-05 ENCOUNTER — Encounter: Payer: Self-pay | Admitting: Internal Medicine

## 2019-04-05 ENCOUNTER — Ambulatory Visit (INDEPENDENT_AMBULATORY_CARE_PROVIDER_SITE_OTHER): Payer: Medicare Other | Admitting: Internal Medicine

## 2019-04-05 DIAGNOSIS — E1165 Type 2 diabetes mellitus with hyperglycemia: Secondary | ICD-10-CM | POA: Diagnosis not present

## 2019-04-05 DIAGNOSIS — I10 Essential (primary) hypertension: Secondary | ICD-10-CM | POA: Diagnosis not present

## 2019-04-05 DIAGNOSIS — E1142 Type 2 diabetes mellitus with diabetic polyneuropathy: Secondary | ICD-10-CM

## 2019-04-05 DIAGNOSIS — G8929 Other chronic pain: Secondary | ICD-10-CM

## 2019-04-05 DIAGNOSIS — M5441 Lumbago with sciatica, right side: Secondary | ICD-10-CM

## 2019-04-05 DIAGNOSIS — E782 Mixed hyperlipidemia: Secondary | ICD-10-CM

## 2019-04-05 DIAGNOSIS — M5442 Lumbago with sciatica, left side: Secondary | ICD-10-CM

## 2019-04-05 MED ORDER — GABAPENTIN 300 MG PO CAPS: 300.0000 mg | ORAL_CAPSULE | Freq: Two times a day (BID) | ORAL | 1 refills | Status: DC

## 2019-04-05 MED ORDER — GLIPIZIDE 10 MG PO TABS: ORAL_TABLET | ORAL | 5 refills | Status: DC

## 2019-04-05 MED ORDER — OZEMPIC (0.25 OR 0.5 MG/DOSE) 2 MG/1.5ML ~~LOC~~ SOPN: PEN_INJECTOR | SUBCUTANEOUS | 5 refills | Status: DC

## 2019-04-05 NOTE — Assessment & Plan Note (Signed)
Has not tolerated statins Lipids, cmp

## 2019-04-05 NOTE — Assessment & Plan Note (Signed)
Uncontrolled Taking metformin and glipizide Has decline additional medication Will check a1c Stressed compliance with diabetic diet, exercise and weight loss Declined insulin Declined januvia Will try ozempic Agrees to see endocrine next year - will refer now  Discussed in detail the consequences of uncontrolled sugars and that damage is being done now

## 2019-04-05 NOTE — Assessment & Plan Note (Signed)
Was on gabapentin in the past - stopped it because she did not feel like it helped -- wants to retry it Restart prior dose of 300 mg BID Check feet day Can increase gabapentin if needed Stressed getting better control of sugars to prevent neuropathy from getting worse

## 2019-04-05 NOTE — Assessment & Plan Note (Signed)
BP sounds controlled at home  BP Readings from Last 3 Encounters:  06/29/18 128/62  02/01/18 132/80  02/01/18 132/80    BP well controlled Current regimen effective and well tolerated Continue current medications at current doses cmp

## 2019-04-05 NOTE — Assessment & Plan Note (Signed)
Following with pain management Encouraged regular exercise and weight loss

## 2019-04-06 ENCOUNTER — Telehealth: Payer: Self-pay | Admitting: Internal Medicine

## 2019-04-06 ENCOUNTER — Ambulatory Visit: Payer: Medicare Other

## 2019-04-06 NOTE — Telephone Encounter (Signed)
Should this be on nurse schedule?

## 2019-04-06 NOTE — Telephone Encounter (Signed)
Pt states she was RXd Semaglutide,0.25 or 0.5MG /DOS, (OZEMPIC, 0.25 OR 0.5 MG/DOSE,) 2 MG/1.5ML SOPN and that the pharmacy can't tell her how to use it.  Pt wants to come in to be shown how to use this medication.

## 2019-04-07 ENCOUNTER — Ambulatory Visit (INDEPENDENT_AMBULATORY_CARE_PROVIDER_SITE_OTHER): Payer: Medicare Other

## 2019-04-07 ENCOUNTER — Other Ambulatory Visit: Payer: Self-pay

## 2019-04-07 ENCOUNTER — Other Ambulatory Visit (INDEPENDENT_AMBULATORY_CARE_PROVIDER_SITE_OTHER): Payer: Medicare Other

## 2019-04-07 DIAGNOSIS — Z23 Encounter for immunization: Secondary | ICD-10-CM | POA: Diagnosis not present

## 2019-04-07 DIAGNOSIS — I10 Essential (primary) hypertension: Secondary | ICD-10-CM | POA: Diagnosis not present

## 2019-04-07 DIAGNOSIS — E782 Mixed hyperlipidemia: Secondary | ICD-10-CM | POA: Diagnosis not present

## 2019-04-07 DIAGNOSIS — E1165 Type 2 diabetes mellitus with hyperglycemia: Secondary | ICD-10-CM | POA: Diagnosis not present

## 2019-04-07 LAB — COMPREHENSIVE METABOLIC PANEL
ALT: 21 U/L (ref 0–35)
AST: 15 U/L (ref 0–37)
Albumin: 4.2 g/dL (ref 3.5–5.2)
Alkaline Phosphatase: 82 U/L (ref 39–117)
BUN: 15 mg/dL (ref 6–23)
CO2: 28 mEq/L (ref 19–32)
Calcium: 9.6 mg/dL (ref 8.4–10.5)
Chloride: 100 mEq/L (ref 96–112)
Creatinine, Ser: 0.85 mg/dL (ref 0.40–1.20)
GFR: 80.04 mL/min (ref >60.00)
Glucose, Bld: 244 mg/dL — ABNORMAL HIGH (ref 70–99)
Potassium: 3.7 mEq/L (ref 3.5–5.1)
Sodium: 138 mEq/L (ref 135–145)
Total Bilirubin: 0.3 mg/dL (ref 0.2–1.2)
Total Protein: 7.7 g/dL (ref 6.0–8.3)

## 2019-04-07 LAB — LIPID PANEL
Cholesterol: 169 mg/dL (ref 0–200)
HDL: 38.3 mg/dL — ABNORMAL LOW (ref >39.00)
LDL Cholesterol: 102 mg/dL — ABNORMAL HIGH (ref 0–99)
NonHDL: 130.35
Total CHOL/HDL Ratio: 4
Triglycerides: 140 mg/dL (ref 0.0–149.0)
VLDL: 28 mg/dL (ref 0.0–40.0)

## 2019-04-07 LAB — MICROALBUMIN / CREATININE URINE RATIO
Creatinine,U: 49.1 mg/dL
Microalb Creat Ratio: 16 mg/g (ref 0.0–30.0)
Microalb, Ur: 7.9 mg/dL — ABNORMAL HIGH (ref 0.0–1.9)

## 2019-04-07 LAB — HEMOGLOBIN A1C: Hgb A1c MFr Bld: 11.2 % — ABNORMAL HIGH (ref 4.6–6.5)

## 2019-04-07 NOTE — Telephone Encounter (Signed)
appt scheduled

## 2019-04-07 NOTE — Telephone Encounter (Signed)
Yes we can teach/demonstrate the use of her injection pen on a nurse visit, just make sure she brings it with her to the visit

## 2019-04-14 ENCOUNTER — Telehealth: Payer: Self-pay

## 2019-04-14 ENCOUNTER — Ambulatory Visit: Payer: Medicare Other

## 2019-04-14 NOTE — Telephone Encounter (Signed)
Pt called in to follow up on call back to discuss below, please advise pt.

## 2019-04-14 NOTE — Telephone Encounter (Signed)
I have talked with patient, gave step by step instructions again ---patient just wanted to make sure she remembered correctly---I have advised ok to schedule nurse visit if she needs visual demonstration

## 2019-04-14 NOTE — Telephone Encounter (Signed)
Can you do me a huge favor and call pt to explain how to use ozempic? Thank you so much!

## 2019-04-24 ENCOUNTER — Other Ambulatory Visit: Payer: Self-pay | Admitting: Internal Medicine

## 2019-04-24 DIAGNOSIS — M545 Low back pain: Secondary | ICD-10-CM | POA: Diagnosis not present

## 2019-04-24 DIAGNOSIS — Z79891 Long term (current) use of opiate analgesic: Secondary | ICD-10-CM | POA: Diagnosis not present

## 2019-04-24 DIAGNOSIS — G894 Chronic pain syndrome: Secondary | ICD-10-CM | POA: Diagnosis not present

## 2019-04-24 DIAGNOSIS — M25569 Pain in unspecified knee: Secondary | ICD-10-CM | POA: Diagnosis not present

## 2019-04-28 ENCOUNTER — Encounter: Payer: Self-pay | Admitting: Internal Medicine

## 2019-05-02 ENCOUNTER — Telehealth: Payer: Self-pay

## 2019-05-02 MED ORDER — GLIPIZIDE 10 MG PO TABS: 10.0000 mg | ORAL_TABLET | Freq: Every day | ORAL | 5 refills | Status: DC

## 2019-05-02 NOTE — Telephone Encounter (Signed)
She should see her eye doctor if she has not seen them in a while.  Most likely it is from her sugars getting better controlled and her body is trying to get used to her sugars being normal.    Continue ozempic.  Decrease glipizide to once a day in the morning.     She should call back next week with her sugar readings.

## 2019-05-02 NOTE — Telephone Encounter (Signed)
Pt states that since she has been taking ozempic she has had blurred vision, eye pressure, and dizziness. Her sugars have been reading under 150s. Please advise.

## 2019-05-02 NOTE — Telephone Encounter (Signed)
Copied from [place] [phone]. Topic: General - Other >> May 02, 2019  9:47 AM Rainey Pines A wrote: Patient would like a callback from nurse in regards to ozempic medication side effects. Please advise

## 2019-05-03 NOTE — Telephone Encounter (Signed)
Pt aware of response. Will keep record of blood sugars.

## 2019-05-22 ENCOUNTER — Other Ambulatory Visit: Payer: Self-pay | Admitting: Internal Medicine

## 2019-05-23 ENCOUNTER — Other Ambulatory Visit: Payer: Self-pay | Admitting: Internal Medicine

## 2019-05-26 DIAGNOSIS — H16223 Keratoconjunctivitis sicca, not specified as Sjogren's, bilateral: Secondary | ICD-10-CM | POA: Diagnosis not present

## 2019-05-26 DIAGNOSIS — H18413 Arcus senilis, bilateral: Secondary | ICD-10-CM | POA: Diagnosis not present

## 2019-05-26 DIAGNOSIS — H11153 Pinguecula, bilateral: Secondary | ICD-10-CM | POA: Diagnosis not present

## 2019-05-26 DIAGNOSIS — Z961 Presence of intraocular lens: Secondary | ICD-10-CM | POA: Diagnosis not present

## 2019-05-26 DIAGNOSIS — H11823 Conjunctivochalasis, bilateral: Secondary | ICD-10-CM | POA: Diagnosis not present

## 2019-06-19 DIAGNOSIS — H18413 Arcus senilis, bilateral: Secondary | ICD-10-CM | POA: Diagnosis not present

## 2019-06-19 DIAGNOSIS — H40023 Open angle with borderline findings, high risk, bilateral: Secondary | ICD-10-CM | POA: Diagnosis not present

## 2019-06-19 DIAGNOSIS — H0288A Meibomian gland dysfunction right eye, upper and lower eyelids: Secondary | ICD-10-CM | POA: Diagnosis not present

## 2019-06-19 DIAGNOSIS — H0288B Meibomian gland dysfunction left eye, upper and lower eyelids: Secondary | ICD-10-CM | POA: Diagnosis not present

## 2019-06-19 DIAGNOSIS — H524 Presbyopia: Secondary | ICD-10-CM | POA: Diagnosis not present

## 2019-06-19 DIAGNOSIS — H16223 Keratoconjunctivitis sicca, not specified as Sjogren's, bilateral: Secondary | ICD-10-CM | POA: Diagnosis not present

## 2019-06-20 DIAGNOSIS — H5213 Myopia, bilateral: Secondary | ICD-10-CM | POA: Diagnosis not present

## 2019-06-23 DIAGNOSIS — M545 Low back pain: Secondary | ICD-10-CM | POA: Diagnosis not present

## 2019-06-23 DIAGNOSIS — M25569 Pain in unspecified knee: Secondary | ICD-10-CM | POA: Diagnosis not present

## 2019-06-23 DIAGNOSIS — Z79891 Long term (current) use of opiate analgesic: Secondary | ICD-10-CM | POA: Diagnosis not present

## 2019-06-23 DIAGNOSIS — G894 Chronic pain syndrome: Secondary | ICD-10-CM | POA: Diagnosis not present

## 2019-07-20 ENCOUNTER — Other Ambulatory Visit: Payer: Self-pay

## 2019-07-20 MED ORDER — LINACLOTIDE 145 MCG PO CAPS: ORAL_CAPSULE | ORAL | 0 refills | Status: DC

## 2019-08-23 DIAGNOSIS — Z79891 Long term (current) use of opiate analgesic: Secondary | ICD-10-CM | POA: Diagnosis not present

## 2019-08-23 DIAGNOSIS — M25569 Pain in unspecified knee: Secondary | ICD-10-CM | POA: Diagnosis not present

## 2019-08-23 DIAGNOSIS — M545 Low back pain: Secondary | ICD-10-CM | POA: Diagnosis not present

## 2019-08-23 DIAGNOSIS — G894 Chronic pain syndrome: Secondary | ICD-10-CM | POA: Diagnosis not present

## 2019-08-24 ENCOUNTER — Telehealth: Payer: Self-pay

## 2019-08-24 NOTE — Telephone Encounter (Signed)
If she is not tolerating the medication she can stop it.  We can discuss alternatives at her appointment next month, which unfortunately I think the only alternative is insulin.

## 2019-08-24 NOTE — Telephone Encounter (Signed)
New message    Pt c/o medication issue:  1. Name of Medication: Ozempic 0.5 mg   2. How are you currently taking this medication (dosage and times per day)? Once a week    3. Are you having a reaction (difficulty breathing--STAT)? No   4. What is your medication issue? Burp a lot smells like sour, gasey

## 2019-08-25 NOTE — Telephone Encounter (Signed)
Pt aware and will stop ozempic for now and discuss other options at next visit.

## 2019-09-14 ENCOUNTER — Other Ambulatory Visit: Payer: Self-pay | Admitting: Internal Medicine

## 2019-09-18 ENCOUNTER — Other Ambulatory Visit: Payer: Self-pay | Admitting: Internal Medicine

## 2019-10-03 NOTE — Progress Notes (Deleted)
Subjective:    Patient ID: Lindsay Hardy, female    DOB: 03-May-1951, 69 y.o.   MRN: [id number]  HPI The patient is here for follow up of their chronic medical problems, including diabetes with neuropathy, hypertension, hyperlipidemia, chronic back pain  She is taking all of her medications as prescribed.   She follows with pain management for her chronic back pain.   She is exercising regularly.       Medications and allergies reviewed with patient and updated if appropriate.  Patient Active Problem List   Diagnosis Date Noted  . Allergic rhinitis 11/22/2018  . External hemorrhoids 10/07/2018  . Candidiasis 10/07/2018  . Chronic constipation 10/07/2018  . Morbid obesity ([place]) 04/01/2017  . PAD (peripheral artery disease) ([place]) 10/29/2016  . Chronic back pain 09/23/2016  . Diabetic peripheral neuropathy ([place]) 02/05/2016  . Uncontrolled diabetes mellitus ([place]) 06/17/2006  . Hyperlipidemia 06/17/2006  . Essential hypertension 06/17/2006  . Coronary atherosclerosis 06/17/2006  . DIASTOLIC DYSFUNCTION 66/44/0347  . DISEASE, CEREBROVASCULAR NEC 06/17/2006  . Osteoarthritis of left knee 06/17/2006    Current Outpatient Medications on File Prior to Visit  Medication Sig Dispense Refill  . Accu-Chek FastClix Lancets MISC USE TO CHECK BLOOD SUGARS 3 TIMES DAILY. E11.65 306 each 2  . ACCU-CHEK GUIDE test strip USE TO CHECK BLOOD SUGARS TWICE DAILY. E11.43 100 strip 5  . albuterol (PROAIR HFA) 108 (90 Base) MCG/ACT inhaler INHALE 2 PUFFS INTO THE LUNGS EVERY 4 (FOUR) HOURS AS NEEDED FOR SHORTNESS OF BREATH. 6.7 g 11  . amLODipine (NORVASC) 10 MG tablet TAKE 1 TABLET BY MOUTH EVERY DAY 90 tablet 1  . aspirin 81 MG EC tablet TAKE 1 TABLET (81 MG TOTAL) BY MOUTH DAILY. 90 tablet 1  . Blood Glucose Monitoring Suppl (ONETOUCH VERIO IQ SYSTEM) w/Device KIT Use to check blood sugar once to twice daily. 1 kit 0  . fluticasone (FLONASE) 50 MCG/ACT nasal spray Place 2 sprays into both  nostrils daily. 16 g 6  . gabapentin (NEURONTIN) 300 MG capsule Take 1 capsule (300 mg total) by mouth 2 (two) times daily. 180 capsule 1  . glipiZIDE (GLUCOTROL) 10 MG tablet TAKE 1 TABLET BY MOUTH TWICE A DAY BEFORE A MEAL 180 tablet 1  . Heating Pads (HEATING PAD DRY HEAT) PADS Use as directed for back pain -M54.9 1 each 0  . hydrochlorothiazide (HYDRODIURIL) 25 MG tablet Take 1 tablet (25 mg total) by mouth daily. 90 tablet 1  . HYDROcodone-acetaminophen (NORCO) 10-325 MG tablet Take 1 tablet by mouth every 6 (six) hours.  0  . hydrocortisone (ANUSOL-HC) 2.5 % rectal cream Place 1 application rectally 2 (two) times daily. 30 g 5  . ibuprofen (ADVIL) 400 MG tablet TAKE 1 TABLET (400 MG TOTAL) BY MOUTH DAILY AS NEEDED. 30 tablet 2  . linaclotide (LINZESS) 145 MCG CAPS capsule TAKE 1 CAPSULE (145 MCG TOTAL) BY MOUTH EVERY MORNING. 90 capsule 0  . metFORMIN (GLUCOPHAGE-XR) 500 MG 24 hr tablet Take 2 tablets by mouth 2 times daily before a meal. 480 tablet 1  . Misc. Devices (BARIATRIC ROLLATOR) MISC Use as directed.  Dx  M54.9 1 each 0  . montelukast (SINGULAIR) 10 MG tablet TAKE 1 TABLET BY MOUTH EVERYDAY AT BEDTIME 90 tablet 1  . nystatin cream (MYCOSTATIN) Apply 1 application topically 2 (two) times daily. For stomach or under breasts 30 g 5  . ONETOUCH VERIO test strip Use to check blood sugar one to two times daily. 300 each  1  . OZEMPIC, 0.25 OR 0.5 MG/DOSE, 2 MG/1.5ML SOPN INJECT 0.25 MG INTO THE SKIN ONCE A WEEK FOR 30 DAYS, THEN 0.5 MG ONCE A WEEK. 4.5 pen 0  . potassium chloride (MICRO-K) 10 MEQ CR capsule Take 1 capsule by mouth twice a day. 180 capsule 1  . VOLTAREN 1 % GEL APPLY 2 GRAMS 3 TIMES DAILY.  0   No current facility-administered medications on file prior to visit.    Past Medical History:  Diagnosis Date  . Asthma   . CAD (coronary artery disease) 11/2003    RCA 60% stenosis, LAD diffuse  . Chicken pox   . CVA (cerebrovascular accident) [facility]) 2001    right  frontoparietal cortical CVA - MRI September 2001  . Diabetes mellitus   . Diastolic dysfunction     ejection fraction 55-65% on 2-D echo May 2006  . Empty sella syndrome [facility])     based on MRI September 2001, related to obesity, hypertension, it was determined that patient has not required treatment but will be monitored with TSH, ACTH, cortisol, testosterone, prolactin, growth hormone  . GERD (gastroesophageal reflux disease)     EGD June 2004 -  followed by Dr. Carlean Purl  . Granulomatous disease, chronic (HCC)     in right upper lobe per x-ray December 2005  . Hyperlipidemia   . Hypertension   . NAFLD (nonalcoholic fatty liver disease)     based on ultrasound June 2002, elevated transaminase  . OA (osteoarthritis)     DJP coracoclavicular ligament, left patella osteophytes June 2002, L4-S1 spondylosis, degenerative disc disease with bulge. I in October 2001, the C2-C4 spondylosis without stenosis and with spurs based on x-ray July 2005; diffuse idiopathic skeletal hyperostosis with bilateral hip lumbar degenerative changes   on x-ray February 2005, bilateral plantar calcaneal spurs  . Reactive airway disease     peak flow 180 in May 2005, chronic sinusitis with PND, bronchitis December 2002 and strep pneumonia April 2007,  FEC 66, MCV 60, ratio is 69, DLCO 57,  moderate restrictive and mild obstructive disease  on spirometry May 2005    Past Surgical History:  Procedure Laterality Date  . CARPAL TUNNEL RELEASE  1992    left arm 1992  . CHOLECYSTECTOMY  2003  . TONSILLECTOMY AND ADENOIDECTOMY      Social History   Socioeconomic History  . Marital status: Single    Spouse name: Not on file  . Number of children: 1  . Years of education: 69  . Highest education level: Not on file  Occupational History  . Occupation:  former Psychologist, counselling  Tobacco Use  . Smoking status: Former Smoker    Packs/day: 0.25    Years: 4.00    Pack years: 1.00    Types: Cigarettes  . Smokeless  tobacco: Never Used  . Tobacco comment: smoked in 20's   Substance and Sexual Activity  . Alcohol use: No    Alcohol/week: 0.0 standard drinks  . Drug use: No  . Sexual activity: Not Currently    Birth control/protection: Post-menopausal  Other Topics Concern  . Not on file  Social History Narrative   Born and raised in [place], [place]. Currently lives in a private residence with her nephew and his wife. Fun: Church activities, spending time with family   Denies any religious beliefs that would effect health care.    Social Determinants of Health   Financial Resource Strain:   . Difficulty of Paying Living Expenses:  Food Insecurity:   . Worried About Charity fundraiser in the Last Year:   . Arboriculturist in the Last Year:   Transportation Needs:   . Film/video editor (Medical):   Marland Kitchen Lack of Transportation (Non-Medical):   Physical Activity:   . Days of Exercise per Week:   . Minutes of Exercise per Session:   Stress:   . Feeling of Stress :   Social Connections:   . Frequency of Communication with Friends and Family:   . Frequency of Social Gatherings with Friends and Family:   . Attends Religious Services:   . Active Member of Clubs or Organizations:   . Attends Archivist Meetings:   Marland Kitchen Marital Status:     Family History  Problem Relation Age of Onset  . Lung cancer Mother   . Hypertension Mother   . Lung cancer Sister   . Hypertension Sister     Review of Systems     Objective:  There were no vitals filed for this visit. BP Readings from Last 3 Encounters:  06/29/18 128/62  02/01/18 132/80  02/01/18 132/80   Wt Readings from Last 3 Encounters:  06/29/18 275 lb 6.4 oz (124.9 kg)  02/01/18 282 lb (127.9 kg)  02/01/18 282 lb (127.9 kg)   There is no height or weight on file to calculate BMI.   Physical Exam    Constitutional: Appears well-developed and well-nourished. No distress.  HENT:  Head: Normocephalic and atraumatic.  Neck:  Neck supple. No tracheal deviation present. No thyromegaly present.  No cervical lymphadenopathy Cardiovascular: Normal rate, regular rhythm and normal heart sounds.   No murmur heard. No carotid bruit .  No edema Pulmonary/Chest: Effort normal and breath sounds normal. No respiratory distress. No has no wheezes. No rales.  Skin: Skin is warm and dry. Not diaphoretic.  Psychiatric: Normal mood and affect. Behavior is normal.      Assessment & Plan:    See Problem List for Assessment and Plan of chronic medical problems.    This visit occurred during the SARS-CoV-2 public health emergency.  Safety protocols were in place, including screening questions prior to the visit, additional usage of staff PPE, and extensive cleaning of exam room while observing appropriate contact time as indicated for disinfecting solutions.

## 2019-10-04 ENCOUNTER — Ambulatory Visit: Payer: Medicare Other | Admitting: Internal Medicine

## 2019-10-15 ENCOUNTER — Other Ambulatory Visit: Payer: Self-pay | Admitting: Internal Medicine

## 2019-10-16 ENCOUNTER — Other Ambulatory Visit: Payer: Self-pay | Admitting: Internal Medicine

## 2019-10-18 ENCOUNTER — Telehealth: Payer: Self-pay | Admitting: Internal Medicine

## 2019-10-18 DIAGNOSIS — M25569 Pain in unspecified knee: Secondary | ICD-10-CM | POA: Diagnosis not present

## 2019-10-18 DIAGNOSIS — Z79891 Long term (current) use of opiate analgesic: Secondary | ICD-10-CM | POA: Diagnosis not present

## 2019-10-18 DIAGNOSIS — G8929 Other chronic pain: Secondary | ICD-10-CM

## 2019-10-18 DIAGNOSIS — G894 Chronic pain syndrome: Secondary | ICD-10-CM | POA: Diagnosis not present

## 2019-10-18 DIAGNOSIS — M545 Low back pain: Secondary | ICD-10-CM | POA: Diagnosis not present

## 2019-10-18 NOTE — Telephone Encounter (Signed)
Ordered

## 2019-10-18 NOTE — Telephone Encounter (Signed)
    Patient requesting referral to a pain clinic. Patient is currently going to [facility] in [place], stating they are closing on June 1

## 2019-10-20 ENCOUNTER — Other Ambulatory Visit: Payer: Self-pay | Admitting: Internal Medicine

## 2019-10-22 ENCOUNTER — Other Ambulatory Visit: Payer: Self-pay | Admitting: Internal Medicine

## 2019-10-24 NOTE — Patient Instructions (Signed)
  Blood work was ordered.     Medications reviewed and updated.  Changes include :     Your prescription(s) have been submitted to your pharmacy. Please take as directed and contact our office if you believe you are having problem(s) with the medication(s).  A referral was ordered for        Someone will call you to schedule this.    Please followup in 6 months   

## 2019-10-24 NOTE — Progress Notes (Signed)
Subjective:    Patient ID: Lindsay Hardy, female    DOB: 10-04-1950, 69 y.o.   MRN: [id number]  HPI The patient is here for follow up of their chronic medical problems, including diabetes with neuropathy, hypertension, hyperlipidemia, chronic back pain  She is taking all of her medications as prescribed.   She follows with pain management for her chronic back pain.   She is exercising regularly.       Medications and allergies reviewed with patient and updated if appropriate.  Patient Active Problem List   Diagnosis Date Noted  . Allergic rhinitis 11/22/2018  . External hemorrhoids 10/07/2018  . Candidiasis 10/07/2018  . Chronic constipation 10/07/2018  . Morbid obesity ([place]) 04/01/2017  . PAD (peripheral artery disease) ([place]) 10/29/2016  . Chronic back pain 09/23/2016  . Diabetic peripheral neuropathy ([place]) 02/05/2016  . Uncontrolled diabetes mellitus ([place]) 06/17/2006  . Hyperlipidemia 06/17/2006  . Essential hypertension 06/17/2006  . Coronary atherosclerosis 06/17/2006  . DIASTOLIC DYSFUNCTION 97/74/1423  . DISEASE, CEREBROVASCULAR NEC 06/17/2006  . Osteoarthritis of left knee 06/17/2006    Current Outpatient Medications on File Prior to Visit  Medication Sig Dispense Refill  . Accu-Chek FastClix Lancets MISC USE TO CHECK BLOOD SUGARS 3 TIMES DAILY. E11.65 306 each 2  . ACCU-CHEK GUIDE test strip USE TO CHECK BLOOD SUGARS TWICE DAILY. E11.43 100 strip 5  . albuterol (PROAIR HFA) 108 (90 Base) MCG/ACT inhaler INHALE 2 PUFFS INTO THE LUNGS EVERY 4 (FOUR) HOURS AS NEEDED FOR SHORTNESS OF BREATH. 6.7 g 11  . amLODipine (NORVASC) 10 MG tablet Take 1 tablet (10 mg total) by mouth daily. Need office visit for more refills. 30 tablet 1  . aspirin 81 MG EC tablet TAKE 1 TABLET (81 MG TOTAL) BY MOUTH DAILY. 90 tablet 1  . Blood Glucose Monitoring Suppl (ONETOUCH VERIO IQ SYSTEM) w/Device KIT Use to check blood sugar once to twice daily. 1 kit 0  . fluticasone (FLONASE) 50  MCG/ACT nasal spray Place 2 sprays into both nostrils daily. 16 g 6  . gabapentin (NEURONTIN) 300 MG capsule Take 1 capsule (300 mg total) by mouth 2 (two) times daily. 180 capsule 1  . glipiZIDE (GLUCOTROL) 10 MG tablet Take 1 tablet (10 mg total) by mouth 2 (two) times daily before a meal. Follow-up appt is due must see provider for future refills 60 tablet 0  . Heating Pads (HEATING PAD DRY HEAT) PADS Use as directed for back pain -M54.9 1 each 0  . hydrochlorothiazide (HYDRODIURIL) 25 MG tablet Take 1 tablet (25 mg total) by mouth daily. Follow-up appt is due must see provider for refills 30 tablet 0  . HYDROcodone-acetaminophen (NORCO) 10-325 MG tablet Take 1 tablet by mouth every 6 (six) hours.  0  . hydrocortisone (ANUSOL-HC) 2.5 % rectal cream Place 1 application rectally 2 (two) times daily. 30 g 5  . ibuprofen (ADVIL) 400 MG tablet TAKE 1 TABLET (400 MG TOTAL) BY MOUTH DAILY AS NEEDED. 30 tablet 2  . linaclotide (LINZESS) 145 MCG CAPS capsule TAKE 1 CAPSULE (145 MCG TOTAL) BY MOUTH EVERY MORNING. 90 capsule 0  . metFORMIN (GLUCOPHAGE-XR) 500 MG 24 hr tablet Take 2 tablets by mouth 2 times daily before a meal. 480 tablet 1  . Misc. Devices (BARIATRIC ROLLATOR) MISC Use as directed.  Dx  M54.9 1 each 0  . montelukast (SINGULAIR) 10 MG tablet TAKE 1 TABLET BY MOUTH EVERYDAY AT BEDTIME 90 tablet 1  . nystatin cream (MYCOSTATIN) Apply 1 application  topically 2 (two) times daily. For stomach or under breasts 30 g 5  . ONETOUCH VERIO test strip Use to check blood sugar one to two times daily. 300 each 1  . OZEMPIC, 0.25 OR 0.5 MG/DOSE, 2 MG/1.5ML SOPN INJECT 0.25 MG INTO THE SKIN ONCE A WEEK FOR 30 DAYS, THEN 0.5 MG ONCE A WEEK. 4.5 pen 0  . potassium chloride (MICRO-K) 10 MEQ CR capsule Take 1 capsule by mouth twice a day. 180 capsule 1  . VOLTAREN 1 % GEL APPLY 2 GRAMS 3 TIMES DAILY.  0   No current facility-administered medications on file prior to visit.    Past Medical History:    Diagnosis Date  . Asthma   . CAD (coronary artery disease) 11/2003    RCA 60% stenosis, LAD diffuse  . Chicken pox   . CVA (cerebrovascular accident) [facility]) 2001    right frontoparietal cortical CVA - MRI September 2001  . Diabetes mellitus   . Diastolic dysfunction     ejection fraction 55-65% on 2-D echo May 2006  . Empty sella syndrome [facility])     based on MRI September 2001, related to obesity, hypertension, it was determined that patient has not required treatment but will be monitored with TSH, ACTH, cortisol, testosterone, prolactin, growth hormone  . GERD (gastroesophageal reflux disease)     EGD June 2004 -  followed by Dr. Carlean Purl  . Granulomatous disease, chronic (HCC)     in right upper lobe per x-ray December 2005  . Hyperlipidemia   . Hypertension   . NAFLD (nonalcoholic fatty liver disease)     based on ultrasound June 2002, elevated transaminase  . OA (osteoarthritis)     DJP coracoclavicular ligament, left patella osteophytes June 2002, L4-S1 spondylosis, degenerative disc disease with bulge. I in October 2001, the C2-C4 spondylosis without stenosis and with spurs based on x-ray July 2005; diffuse idiopathic skeletal hyperostosis with bilateral hip lumbar degenerative changes   on x-ray February 2005, bilateral plantar calcaneal spurs  . Reactive airway disease     peak flow 180 in May 2005, chronic sinusitis with PND, bronchitis December 2002 and strep pneumonia April 2007,  FEC 66, MCV 60, ratio is 69, DLCO 57,  moderate restrictive and mild obstructive disease  on spirometry May 2005    Past Surgical History:  Procedure Laterality Date  . CARPAL TUNNEL RELEASE  1992    left arm 1992  . CHOLECYSTECTOMY  2003  . TONSILLECTOMY AND ADENOIDECTOMY      Social History   Socioeconomic History  . Marital status: Single    Spouse name: Not on file  . Number of children: 1  . Years of education: 15  . Highest education level: Not on file  Occupational History  .  Occupation:  former Psychologist, counselling  Tobacco Use  . Smoking status: Former Smoker    Packs/day: 0.25    Years: 4.00    Pack years: 1.00    Types: Cigarettes  . Smokeless tobacco: Never Used  . Tobacco comment: smoked in 20's   Substance and Sexual Activity  . Alcohol use: No    Alcohol/week: 0.0 standard drinks  . Drug use: No  . Sexual activity: Not Currently    Birth control/protection: Post-menopausal  Other Topics Concern  . Not on file  Social History Narrative   Born and raised in [place], [place]. Currently lives in a private residence with her nephew and his wife. Fun: Church activities, spending time with family  Denies any religious beliefs that would effect health care.    Social Determinants of Health   Financial Resource Strain:   . Difficulty of Paying Living Expenses:   Food Insecurity:   . Worried About Charity fundraiser in the Last Year:   . Arboriculturist in the Last Year:   Transportation Needs:   . Film/video editor (Medical):   Marland Kitchen Lack of Transportation (Non-Medical):   Physical Activity:   . Days of Exercise per Week:   . Minutes of Exercise per Session:   Stress:   . Feeling of Stress :   Social Connections:   . Frequency of Communication with Friends and Family:   . Frequency of Social Gatherings with Friends and Family:   . Attends Religious Services:   . Active Member of Clubs or Organizations:   . Attends Archivist Meetings:   Marland Kitchen Marital Status:     Family History  Problem Relation Age of Onset  . Lung cancer Mother   . Hypertension Mother   . Lung cancer Sister   . Hypertension Sister     Review of Systems     Objective:  There were no vitals filed for this visit. BP Readings from Last 3 Encounters:  06/29/18 128/62  02/01/18 132/80  02/01/18 132/80   Wt Readings from Last 3 Encounters:  06/29/18 275 lb 6.4 oz (124.9 kg)  02/01/18 282 lb (127.9 kg)  02/01/18 282 lb (127.9 kg)   There is no height or  weight on file to calculate BMI.   Physical Exam    Constitutional: Appears well-developed and well-nourished. No distress.  HENT:  Head: Normocephalic and atraumatic.  Neck: Neck supple. No tracheal deviation present. No thyromegaly present.  No cervical lymphadenopathy Cardiovascular: Normal rate, regular rhythm and normal heart sounds.   No murmur heard. No carotid bruit .  No edema Pulmonary/Chest: Effort normal and breath sounds normal. No respiratory distress. No has no wheezes. No rales.  Skin: Skin is warm and dry. Not diaphoretic.  Psychiatric: Normal mood and affect. Behavior is normal.      Assessment & Plan:    See Problem List for Assessment and Plan of chronic medical problems.    This visit occurred during the SARS-CoV-2 public health emergency.  Safety protocols were in place, including screening questions prior to the visit, additional usage of staff PPE, and extensive cleaning of exam room while observing appropriate contact time as indicated for disinfecting solutions.    This encounter was created in error - please disregard.

## 2019-10-25 ENCOUNTER — Encounter: Payer: Medicare Other | Admitting: Internal Medicine

## 2019-10-31 NOTE — Progress Notes (Signed)
Subjective:    Patient ID: Lindsay Hardy, female    DOB: 10/14/1950, 69 y.o.   MRN: [id number]  HPI The patient is here for follow up of their chronic medical problems, including diabetes with neuropathy, hypertension, hyperlipidemia, chronic back pain, obesity  She is taking all of her medications as prescribed.   She follows with pain management for her chronic back pain. That practice is closing and needs a referral to a new pain management which has been ordered.  Her back pain is getting worse.  She can not stand up long.  She has some radiating pain down her legs at times.  She also has chronic knee pain.   She is not exercising regularly.     She took ozempic for 2-3 months - it made her nausea, burping, GERD.  She has been off of it for > 1 month.     Medications and allergies reviewed with patient and updated if appropriate.  Patient Active Problem List   Diagnosis Date Noted  . Allergic rhinitis 11/22/2018  . External hemorrhoids 10/07/2018  . Candidiasis 10/07/2018  . Chronic constipation 10/07/2018  . Morbid obesity ([place]) 04/01/2017  . PAD (peripheral artery disease) ([place]) 10/29/2016  . Chronic back pain 09/23/2016  . Diabetic peripheral neuropathy ([place]) 02/05/2016  . Uncontrolled diabetes mellitus ([place]) 06/17/2006  . Hyperlipidemia 06/17/2006  . Essential hypertension 06/17/2006  . Coronary atherosclerosis 06/17/2006  . DIASTOLIC DYSFUNCTION 01/19/4817  . DISEASE, CEREBROVASCULAR NEC 06/17/2006  . Osteoarthritis of left knee 06/17/2006    Current Outpatient Medications on File Prior to Visit  Medication Sig Dispense Refill  . Accu-Chek FastClix Lancets MISC USE TO CHECK BLOOD SUGARS 3 TIMES DAILY. E11.65 306 each 2  . ACCU-CHEK GUIDE test strip USE TO CHECK BLOOD SUGARS TWICE DAILY. E11.43 100 strip 5  . albuterol (PROAIR HFA) 108 (90 Base) MCG/ACT inhaler INHALE 2 PUFFS INTO THE LUNGS EVERY 4 (FOUR) HOURS AS NEEDED FOR SHORTNESS OF BREATH. 6.7 g 11  .  aspirin 81 MG EC tablet TAKE 1 TABLET (81 MG TOTAL) BY MOUTH DAILY. 90 tablet 1  . Blood Glucose Monitoring Suppl (ONETOUCH VERIO IQ SYSTEM) w/Device KIT Use to check blood sugar once to twice daily. 1 kit 0  . fluticasone (FLONASE) 50 MCG/ACT nasal spray Place 2 sprays into both nostrils daily. 16 g 6  . gabapentin (NEURONTIN) 300 MG capsule Take 1 capsule (300 mg total) by mouth 2 (two) times daily. 180 capsule 1  . HYDROcodone-acetaminophen (NORCO) 10-325 MG tablet Take 1 tablet by mouth every 6 (six) hours.  0  . hydrocortisone (ANUSOL-HC) 2.5 % rectal cream Place 1 application rectally 2 (two) times daily. 30 g 5  . Misc. Devices (BARIATRIC ROLLATOR) MISC Use as directed.  Dx  M54.9 1 each 0  . montelukast (SINGULAIR) 10 MG tablet TAKE 1 TABLET BY MOUTH EVERYDAY AT BEDTIME 90 tablet 1  . nystatin cream (MYCOSTATIN) Apply 1 application topically 2 (two) times daily. For stomach or under breasts 30 g 5  . potassium chloride (MICRO-K) 10 MEQ CR capsule Take 1 capsule by mouth twice a day. 180 capsule 1   No current facility-administered medications on file prior to visit.    Past Medical History:  Diagnosis Date  . Asthma   . CAD (coronary artery disease) 11/2003    RCA 60% stenosis, LAD diffuse  . Chicken pox   . CVA (cerebrovascular accident) [facility]) 2001    right frontoparietal cortical CVA - MRI September 2001  .  Diabetes mellitus   . Diastolic dysfunction     ejection fraction 55-65% on 2-D echo May 2006  . Empty sella syndrome [facility])     based on MRI September 2001, related to obesity, hypertension, it was determined that patient has not required treatment but will be monitored with TSH, ACTH, cortisol, testosterone, prolactin, growth hormone  . GERD (gastroesophageal reflux disease)     EGD June 2004 -  followed by Dr. Carlean Purl  . Granulomatous disease, chronic (HCC)     in right upper lobe per x-ray December 2005  . Hyperlipidemia   . Hypertension   . NAFLD (nonalcoholic fatty  liver disease)     based on ultrasound June 2002, elevated transaminase  . OA (osteoarthritis)     DJP coracoclavicular ligament, left patella osteophytes June 2002, L4-S1 spondylosis, degenerative disc disease with bulge. I in October 2001, the C2-C4 spondylosis without stenosis and with spurs based on x-ray July 2005; diffuse idiopathic skeletal hyperostosis with bilateral hip lumbar degenerative changes   on x-ray February 2005, bilateral plantar calcaneal spurs  . Reactive airway disease     peak flow 180 in May 2005, chronic sinusitis with PND, bronchitis December 2002 and strep pneumonia April 2007,  FEC 66, MCV 60, ratio is 69, DLCO 57,  moderate restrictive and mild obstructive disease  on spirometry May 2005    Past Surgical History:  Procedure Laterality Date  . CARPAL TUNNEL RELEASE  1992    left arm 1992  . CHOLECYSTECTOMY  2003  . TONSILLECTOMY AND ADENOIDECTOMY      Social History   Socioeconomic History  . Marital status: Single    Spouse name: Not on file  . Number of children: 1  . Years of education: 39  . Highest education level: Not on file  Occupational History  . Occupation:  former Psychologist, counselling  Tobacco Use  . Smoking status: Former Smoker    Packs/day: 0.25    Years: 4.00    Pack years: 1.00    Types: Cigarettes  . Smokeless tobacco: Never Used  . Tobacco comment: smoked in 20's   Substance and Sexual Activity  . Alcohol use: No    Alcohol/week: 0.0 standard drinks  . Drug use: No  . Sexual activity: Not Currently    Birth control/protection: Post-menopausal  Other Topics Concern  . Not on file  Social History Narrative   Born and raised in [place], [place]. Currently lives in a private residence with her nephew and his wife. Fun: Church activities, spending time with family   Denies any religious beliefs that would effect health care.    Social Determinants of Health   Financial Resource Strain:   . Difficulty of Paying Living Expenses:     Food Insecurity:   . Worried About Charity fundraiser in the Last Year:   . Arboriculturist in the Last Year:   Transportation Needs:   . Film/video editor (Medical):   Marland Kitchen Lack of Transportation (Non-Medical):   Physical Activity:   . Days of Exercise per Week:   . Minutes of Exercise per Session:   Stress:   . Feeling of Stress :   Social Connections:   . Frequency of Communication with Friends and Family:   . Frequency of Social Gatherings with Friends and Family:   . Attends Religious Services:   . Active Member of Clubs or Organizations:   . Attends Archivist Meetings:   Marland Kitchen Marital Status:  Family History  Problem Relation Age of Onset  . Lung cancer Mother   . Hypertension Mother   . Lung cancer Sister   . Hypertension Sister     Review of Systems  Constitutional: Negative for chills and fever.  Respiratory: Positive for wheezing (allergy related). Negative for cough and shortness of breath.   Cardiovascular: Negative for chest pain, palpitations and leg swelling.  Musculoskeletal: Positive for arthralgias and back pain.  Neurological: Negative for light-headedness and headaches.       Objective:   Vitals:   11/01/19 1411  BP: (!) 146/90  Pulse: 94  Resp: 16  Temp: 99 F (37.2 C)  SpO2: 98%   BP Readings from Last 3 Encounters:  11/01/19 (!) 146/90  06/29/18 128/62  02/01/18 132/80   Wt Readings from Last 3 Encounters:  11/01/19 268 lb (121.6 kg)  06/29/18 275 lb 6.4 oz (124.9 kg)  02/01/18 282 lb (127.9 kg)   Body mass index is 44.6 kg/m.   Physical Exam    Constitutional: Appears well-developed and well-nourished. No distress.  HENT:  Head: Normocephalic and atraumatic.  Neck: Neck supple. No tracheal deviation present. No thyromegaly present.  No cervical lymphadenopathy Cardiovascular: Normal rate, regular rhythm and normal heart sounds.   No murmur heard. No carotid bruit .  No edema Pulmonary/Chest: Effort normal and  breath sounds normal. No respiratory distress. No has no wheezes. No rales.  Skin: Skin is warm and dry. Not diaphoretic.  Psychiatric: Normal mood and affect. Behavior is normal.   Diabetic Foot Exam - Simple   Simple Foot Form Diabetic Foot exam was performed with the following findings: Yes   Visual Inspection No deformities, no ulcerations, no other skin breakdown bilaterally: Yes Sensation Testing Intact to touch and monofilament testing bilaterally: Yes Pulse Check Posterior Tibialis and Dorsalis pulse intact bilaterally: Yes Comments       Assessment & Plan:    See Problem List for Assessment and Plan of chronic medical problems.    This visit occurred during the SARS-CoV-2 public health emergency.  Safety protocols were in place, including screening questions prior to the visit, additional usage of staff PPE, and extensive cleaning of exam room while observing appropriate contact time as indicated for disinfecting solutions.

## 2019-10-31 NOTE — Patient Instructions (Addendum)
  Blood work was ordered.     Medications reviewed and updated.  Changes include :   none  Your prescription(s) have been submitted to your pharmacy. Please take as directed and contact our office if you believe you are having problem(s) with the medication(s).   Please followup in 3 months   

## 2019-11-01 ENCOUNTER — Encounter: Payer: Self-pay | Admitting: Internal Medicine

## 2019-11-01 ENCOUNTER — Ambulatory Visit (INDEPENDENT_AMBULATORY_CARE_PROVIDER_SITE_OTHER): Payer: Medicare Other | Admitting: Internal Medicine

## 2019-11-01 ENCOUNTER — Other Ambulatory Visit: Payer: Self-pay

## 2019-11-01 VITALS — BP 146/90 | HR 94 | Temp 99.0°F | Resp 16 | Ht 65.0 in | Wt 268.0 lb

## 2019-11-01 DIAGNOSIS — E782 Mixed hyperlipidemia: Secondary | ICD-10-CM

## 2019-11-01 DIAGNOSIS — K5909 Other constipation: Secondary | ICD-10-CM

## 2019-11-01 DIAGNOSIS — E1165 Type 2 diabetes mellitus with hyperglycemia: Secondary | ICD-10-CM | POA: Diagnosis not present

## 2019-11-01 DIAGNOSIS — E1142 Type 2 diabetes mellitus with diabetic polyneuropathy: Secondary | ICD-10-CM | POA: Diagnosis not present

## 2019-11-01 DIAGNOSIS — I10 Essential (primary) hypertension: Secondary | ICD-10-CM

## 2019-11-01 DIAGNOSIS — M5442 Lumbago with sciatica, left side: Secondary | ICD-10-CM

## 2019-11-01 DIAGNOSIS — M5441 Lumbago with sciatica, right side: Secondary | ICD-10-CM

## 2019-11-01 DIAGNOSIS — M170 Bilateral primary osteoarthritis of knee: Secondary | ICD-10-CM

## 2019-11-01 DIAGNOSIS — G8929 Other chronic pain: Secondary | ICD-10-CM

## 2019-11-01 MED ORDER — DICLOFENAC SODIUM 1 % EX GEL: 4.0000 g | Freq: Four times a day (QID) | CUTANEOUS | 5 refills | Status: DC

## 2019-11-01 MED ORDER — AMLODIPINE BESYLATE 10 MG PO TABS: 10.0000 mg | ORAL_TABLET | Freq: Every day | ORAL | 1 refills | Status: DC

## 2019-11-01 MED ORDER — METFORMIN HCL ER 500 MG PO TB24: ORAL_TABLET | ORAL | 1 refills | Status: DC

## 2019-11-01 MED ORDER — GLIPIZIDE 10 MG PO TABS: 10.0000 mg | ORAL_TABLET | Freq: Two times a day (BID) | ORAL | 1 refills | Status: DC

## 2019-11-01 MED ORDER — LINACLOTIDE 145 MCG PO CAPS: ORAL_CAPSULE | ORAL | 0 refills | Status: DC

## 2019-11-01 MED ORDER — HYDROCHLOROTHIAZIDE 25 MG PO TABS: 25.0000 mg | ORAL_TABLET | Freq: Every day | ORAL | 1 refills | Status: DC

## 2019-11-01 NOTE — Assessment & Plan Note (Signed)
Chronic  Taking gabapentin daily - not sure if it helps Will continue gabapentin - discussed we can increase this at some point or stop it  -it is up to her

## 2019-11-01 NOTE — Assessment & Plan Note (Signed)
Chronic bl knees Encouraged weight loss, increased activity

## 2019-11-01 NOTE — Assessment & Plan Note (Signed)
Chronic Not able to exercise due to chronic pain - advised as much as she can Healthy diet, decreased portions stressed weight loss for multiple reasons

## 2019-11-01 NOTE — Assessment & Plan Note (Signed)
Chronic Controlled with linzess Continue current dose

## 2019-11-01 NOTE — Assessment & Plan Note (Addendum)
Chronic Sugars under 150 at home  - ? Truly less than 150 Check a1c Low sugar / carb diet encouraged regular exercise Advised weight loss Will likely need additional medication

## 2019-11-01 NOTE — Assessment & Plan Note (Signed)
BP Readings from Last 3 Encounters:  11/01/19 (!) 146/90  06/29/18 128/62  02/01/18 132/80   Chronic BP likely controlled Not always compliant with a low sodium diet Current regimen effective and well tolerated Continue current medications at current doses cmp

## 2019-11-01 NOTE — Assessment & Plan Note (Signed)
Chronic Check lipid panel  Statin intolerant Regular exercise and healthy diet encouraged  

## 2019-11-01 NOTE — Assessment & Plan Note (Signed)
Chronic Pain management office is closing - has been referred to a different pain management office Taking vicodin 10-325 mg every 6 hrs  Sedentary, obese Encouraged weight loss, more activity

## 2019-11-07 ENCOUNTER — Telehealth: Payer: Self-pay | Admitting: Internal Medicine

## 2019-11-07 NOTE — Telephone Encounter (Signed)
   Patient requesting completion of forms for handicap placard When complete leave at front desk

## 2019-11-09 NOTE — Telephone Encounter (Signed)
Handicap placard completed and left up front. Pt will come get it when she come back in for labs today or tomorrow.

## 2019-11-14 DIAGNOSIS — H524 Presbyopia: Secondary | ICD-10-CM | POA: Diagnosis not present

## 2019-11-23 ENCOUNTER — Other Ambulatory Visit: Payer: Self-pay

## 2019-11-23 ENCOUNTER — Emergency Department (HOSPITAL_COMMUNITY)
Admission: EM | Admit: 2019-11-23 | Discharge: 2019-11-24 | Disposition: A | Discharge status: Home / Self Care | Payer: Medicare Other | Attending: Emergency Medicine | Admitting: Emergency Medicine

## 2019-11-23 ENCOUNTER — Encounter (HOSPITAL_COMMUNITY): Payer: Self-pay | Admitting: Emergency Medicine

## 2019-11-23 ENCOUNTER — Telehealth: Payer: Self-pay | Admitting: Internal Medicine

## 2019-11-23 ENCOUNTER — Emergency Department (HOSPITAL_COMMUNITY): Payer: Medicare Other

## 2019-11-23 DIAGNOSIS — Z7984 Long term (current) use of oral hypoglycemic drugs: Secondary | ICD-10-CM | POA: Diagnosis not present

## 2019-11-23 DIAGNOSIS — I119 Hypertensive heart disease without heart failure: Secondary | ICD-10-CM | POA: Insufficient documentation

## 2019-11-23 DIAGNOSIS — R000 Tachycardia, unspecified: Secondary | ICD-10-CM | POA: Diagnosis not present

## 2019-11-23 DIAGNOSIS — Z79899 Other long term (current) drug therapy: Secondary | ICD-10-CM | POA: Insufficient documentation

## 2019-11-23 DIAGNOSIS — J209 Acute bronchitis, unspecified: Secondary | ICD-10-CM | POA: Diagnosis not present

## 2019-11-23 DIAGNOSIS — J45909 Unspecified asthma, uncomplicated: Secondary | ICD-10-CM | POA: Insufficient documentation

## 2019-11-23 DIAGNOSIS — R0602 Shortness of breath: Secondary | ICD-10-CM | POA: Diagnosis not present

## 2019-11-23 DIAGNOSIS — I2510 Atherosclerotic heart disease of native coronary artery without angina pectoris: Secondary | ICD-10-CM | POA: Insufficient documentation

## 2019-11-23 DIAGNOSIS — E119 Type 2 diabetes mellitus without complications: Secondary | ICD-10-CM | POA: Diagnosis not present

## 2019-11-23 DIAGNOSIS — R0600 Dyspnea, unspecified: Secondary | ICD-10-CM | POA: Diagnosis not present

## 2019-11-23 DIAGNOSIS — J40 Bronchitis, not specified as acute or chronic: Secondary | ICD-10-CM

## 2019-11-23 LAB — CBC
HCT: 40.2 % (ref 36.0–46.0)
Hemoglobin: 12.4 g/dL (ref 12.0–15.0)
MCH: 25.6 pg — ABNORMAL LOW (ref 26.0–34.0)
MCHC: 30.8 g/dL (ref 30.0–36.0)
MCV: 82.9 fL (ref 80.0–100.0)
Platelets: 328 10*3/uL (ref 150–400)
RBC: 4.85 MIL/uL (ref 3.87–5.11)
RDW: 14.1 % (ref 11.5–15.5)
WBC: 6.6 10*3/uL (ref 4.0–10.5)
nRBC: 0 % (ref 0.0–0.2)

## 2019-11-23 LAB — BASIC METABOLIC PANEL
Anion gap: 9 (ref 5–15)
BUN: 9 mg/dL (ref 8–23)
CO2: 27 mmol/L (ref 22–32)
Calcium: 8.9 mg/dL (ref 8.9–10.3)
Chloride: 102 mmol/L (ref 98–111)
Creatinine, Ser: 0.83 mg/dL (ref 0.44–1.00)
GFR calc Af Amer: >60 mL/min (ref >60)
GFR calc non Af Amer: >60 mL/min (ref >60)
Glucose, Bld: 168 mg/dL — ABNORMAL HIGH (ref 70–99)
Potassium: 3.9 mmol/L (ref 3.5–5.1)
Sodium: 138 mmol/L (ref 135–145)

## 2019-11-23 LAB — CBG MONITORING, ED: Glucose-Capillary: 201 mg/dL — ABNORMAL HIGH (ref 70–99)

## 2019-11-23 MED ORDER — PREDNISONE 20 MG PO TABS
40.0000 mg | ORAL_TABLET | Freq: Once | ORAL | Status: AC
  Administered 2019-11-23: 40 mg via ORAL
  Filled 2019-11-23: qty 2

## 2019-11-23 MED ORDER — ALUM & MAG HYDROXIDE-SIMETH 200-200-20 MG/5ML PO SUSP
30.0000 mL | Freq: Once | ORAL | Status: AC
  Administered 2019-11-23: 30 mL via ORAL
  Filled 2019-11-23: qty 30

## 2019-11-23 NOTE — ED Triage Notes (Signed)
Pt endorses wheezing and exertional shortness of breath x 2 weeks. Using albuterol inhaler without relief. Denies chest pain.

## 2019-11-23 NOTE — ED Provider Notes (Signed)
[facility] EMERGENCY DEPARTMENT Provider Note   CSN: [id number] Arrival date & time: 11/23/19  1208     History Chief Complaint  Patient presents with  . Shortness of Breath    Lindsay Hardy is a 69 y.o. female.  The history is provided by the patient.  Shortness of Breath Severity:  Moderate Onset quality:  Gradual Duration:  3 weeks Timing:  Intermittent Progression:  Worsening Chronicity:  Recurrent Context comment:  Mainly at night  Relieved by:  Nothing Exacerbated by: laying flat. Ineffective treatments:  Inhaler Associated symptoms: wheezing   Associated symptoms: no abdominal pain, no chest pain, no claudication, no cough, no diaphoresis, no ear pain, no fever, no headaches, no hemoptysis, no neck pain, no rash, no sore throat, no sputum production, no syncope and no swollen glands   Risk factors: no recent alcohol use and no hx of PE/DVT   Patient with asthma presents with wheezing mostly at night for several weeks.  No f/c/r.  No cough.  No CP.  She denies DOE.  No n/v/d.  No leg pain nor swelling.       Past Medical History:  Diagnosis Date  . Asthma   . CAD (coronary artery disease) 11/2003    RCA 60% stenosis, LAD diffuse  . Chicken pox   . CVA (cerebrovascular accident) [facility]) 2001    right frontoparietal cortical CVA - MRI September 2001  . Diabetes mellitus   . Diastolic dysfunction     ejection fraction 55-65% on 2-D echo May 2006  . Empty sella syndrome [facility])     based on MRI September 2001, related to obesity, hypertension, it was determined that patient has not required treatment but will be monitored with TSH, ACTH, cortisol, testosterone, prolactin, growth hormone  . GERD (gastroesophageal reflux disease)     EGD June 2004 -  followed by Dr. Carlean Purl  . Granulomatous disease, chronic (HCC)     in right upper lobe per x-ray December 2005  . Hyperlipidemia   . Hypertension   . NAFLD (nonalcoholic fatty liver disease)      based on ultrasound June 2002, elevated transaminase  . OA (osteoarthritis)     DJP coracoclavicular ligament, left patella osteophytes June 2002, L4-S1 spondylosis, degenerative disc disease with bulge. I in October 2001, the C2-C4 spondylosis without stenosis and with spurs based on x-ray July 2005; diffuse idiopathic skeletal hyperostosis with bilateral hip lumbar degenerative changes   on x-ray February 2005, bilateral plantar calcaneal spurs  . Reactive airway disease     peak flow 180 in May 2005, chronic sinusitis with PND, bronchitis December 2002 and strep pneumonia Jorryn Casagrande 2007,  FEC 66, MCV 60, ratio is 69, DLCO 57,  moderate restrictive and mild obstructive disease  on spirometry May 2005    Patient Active Problem List   Diagnosis Date Noted  . Allergic rhinitis 11/22/2018  . External hemorrhoids 10/07/2018  . Candidiasis 10/07/2018  . Chronic constipation 10/07/2018  . Morbid obesity ([place]) 04/01/2017  . PAD (peripheral artery disease) ([place]) 10/29/2016  . Chronic back pain 09/23/2016  . Diabetic peripheral neuropathy ([place]) 02/05/2016  . Uncontrolled diabetes mellitus ([place]) 06/17/2006  . Hyperlipidemia 06/17/2006  . Essential hypertension 06/17/2006  . Coronary atherosclerosis 06/17/2006  . DIASTOLIC DYSFUNCTION 73/53/2992  . DISEASE, CEREBROVASCULAR NEC 06/17/2006  . Osteoarthritis of both knees 06/17/2006    Past Surgical History:  Procedure Laterality Date  . CARPAL TUNNEL RELEASE  1992    left arm 1992  .  CHOLECYSTECTOMY  2003  . TONSILLECTOMY AND ADENOIDECTOMY       OB History   No obstetric history on file.     Family History  Problem Relation Age of Onset  . Lung cancer Mother   . Hypertension Mother   . Lung cancer Sister   . Hypertension Sister     Social History   Tobacco Use  . Smoking status: Former Smoker    Packs/day: 0.25    Years: 4.00    Pack years: 1.00    Types: Cigarettes  . Smokeless tobacco: Never Used  . Tobacco comment: smoked  in 20's   Vaping Use  . Vaping Use: Never used  Substance Use Topics  . Alcohol use: No    Alcohol/week: 0.0 standard drinks  . Drug use: No    Home Medications Prior to Admission medications   Medication Sig Start Date End Date Taking? Authorizing Provider  Accu-Chek FastClix Lancets MISC USE TO CHECK BLOOD SUGARS 3 TIMES DAILY. E11.65 04/24/19   Binnie Rail, MD  ACCU-CHEK GUIDE test strip USE TO CHECK BLOOD SUGARS TWICE DAILY. E11.43 02/06/19   Binnie Rail, MD  albuterol (PROAIR HFA) 108 (90 Base) MCG/ACT inhaler INHALE 2 PUFFS INTO THE LUNGS EVERY 4 (FOUR) HOURS AS NEEDED FOR SHORTNESS OF BREATH. 01/04/19   Binnie Rail, MD  amLODipine (NORVASC) 10 MG tablet Take 1 tablet (10 mg total) by mouth daily. 11/01/19   Binnie Rail, MD  aspirin 81 MG EC tablet TAKE 1 TABLET (81 MG TOTAL) BY MOUTH DAILY. 10/29/16   Binnie Rail, MD  Blood Glucose Monitoring Suppl (ONETOUCH VERIO IQ SYSTEM) w/Device KIT Use to check blood sugar once to twice daily. 11/25/16   Binnie Rail, MD  diclofenac Sodium (VOLTAREN) 1 % GEL Apply 4 g topically 4 (four) times daily. 11/01/19   Binnie Rail, MD  fluticasone (FLONASE) 50 MCG/ACT nasal spray Place 2 sprays into both nostrils daily. 03/25/16   Binnie Rail, MD  gabapentin (NEURONTIN) 300 MG capsule Take 1 capsule (300 mg total) by mouth 2 (two) times daily. 04/05/19   Binnie Rail, MD  glipiZIDE (GLUCOTROL) 10 MG tablet Take 1 tablet (10 mg total) by mouth 2 (two) times daily before a meal. 11/01/19   Burns, Claudina Lick, MD  hydrochlorothiazide (HYDRODIURIL) 25 MG tablet Take 1 tablet (25 mg total) by mouth daily. 11/01/19   Binnie Rail, MD  HYDROcodone-acetaminophen (NORCO) 10-325 MG tablet Take 1 tablet by mouth every 6 (six) hours. 01/22/16   [provider]  hydrocortisone (ANUSOL-HC) 2.5 % rectal cream Place 1 application rectally 2 (two) times daily. 10/07/18   Binnie Rail, MD  linaclotide (LINZESS) 145 MCG CAPS capsule TAKE 1 CAPSULE  (145 MCG TOTAL) BY MOUTH EVERY MORNING. 11/01/19   Binnie Rail, MD  metFORMIN (GLUCOPHAGE-XR) 500 MG 24 hr tablet Take 2 tablets by mouth 2 times daily before a meal. 11/01/19   Burns, Claudina Lick, MD  Misc. Devices (BARIATRIC ROLLATOR) MISC Use as directed.  Dx  M54.9 06/29/18   Binnie Rail, MD  montelukast (SINGULAIR) 10 MG tablet TAKE 1 TABLET BY MOUTH EVERYDAY AT BEDTIME 01/04/19   Burns, Claudina Lick, MD  nystatin cream (MYCOSTATIN) Apply 1 application topically 2 (two) times daily. For stomach or under breasts 10/07/18   Binnie Rail, MD  potassium chloride (MICRO-K) 10 MEQ CR capsule Take 1 capsule by mouth twice a day. 01/04/19   Billey Gosling  J, MD    Allergies    Atorvastatin, Clopidogrel bisulfate, Crestor [rosuvastatin calcium], Fexofenadine, Furosemide, Hydrochlorothiazide w-triamterene, Lisinopril, Metoprolol tartrate, Mometasone furoate, and Ozempic (0.25 or 0.5 mg-dose) [semaglutide(0.25 or 0.19m-dos)]  Review of Systems   Review of Systems  Constitutional: Negative for diaphoresis and fever.  HENT: Negative for ear pain and sore throat.   Eyes: Negative for visual disturbance.  Respiratory: Positive for shortness of breath and wheezing. Negative for cough, hemoptysis and sputum production.   Cardiovascular: Negative for chest pain, palpitations, claudication, leg swelling and syncope.  Gastrointestinal: Negative for abdominal pain.  Genitourinary: Negative for difficulty urinating.  Musculoskeletal: Negative for neck pain.  Skin: Negative for rash.  Neurological: Negative for headaches.  Psychiatric/Behavioral: Negative for agitation.  All other systems reviewed and are negative.   Physical Exam Updated Vital Signs BP 135/77 (BP Location: Left Arm)   Pulse 96   Temp 98.5 F (36.9 C) (Oral)   Resp (!) 23   Ht _0  (1.651 m)   Wt 121.6 kg   SpO2 100%   BMI 44.60 kg/m   Physical Exam Vitals and nursing note reviewed.  Constitutional:      General: She is not in acute  distress.    Appearance: Normal appearance.  HENT:     Head: Normocephalic and atraumatic.     Nose:     Comments: Nasal congestion  Eyes:     Conjunctiva/sclera: Conjunctivae normal.     Pupils: Pupils are equal, round, and reactive to light.  Cardiovascular:     Rate and Rhythm: Normal rate and regular rhythm.     Pulses: Normal pulses.     Heart sounds: Normal heart sounds.  Pulmonary:     Effort: Pulmonary effort is normal. No respiratory distress.     Breath sounds: Normal breath sounds. No wheezing or rales.  Abdominal:     General: Abdomen is flat. Bowel sounds are normal.     Tenderness: There is no abdominal tenderness. There is no guarding.  Musculoskeletal:        General: Normal range of motion.     Cervical back: Normal range of motion and neck supple.  Skin:    General: Skin is warm and dry.     Capillary Refill: Capillary refill takes less than 2 seconds.  Neurological:     General: No focal deficit present.     Mental Status: She is alert and oriented to person, place, and time.     Deep Tendon Reflexes: Reflexes normal.  Psychiatric:        Mood and Affect: Mood normal.        Behavior: Behavior normal.     ED Results / Procedures / Treatments   Labs (all labs ordered are listed, but only abnormal results are displayed) Results for orders placed or performed during the hospital encounter of 11/23/19  CBC  Result Value Ref Range   WBC 6.6 4.0 - 10.5 K/uL   RBC 4.85 3.87 - 5.11 MIL/uL   Hemoglobin 12.4 12.0 - 15.0 g/dL   HCT 40.2 36 - 46 %   MCV 82.9 80.0 - 100.0 fL   MCH 25.6 (L) 26.0 - 34.0 pg   MCHC 30.8 30.0 - 36.0 g/dL   RDW 14.1 11.5 - 15.5 %   Platelets 328 150 - 400 K/uL   nRBC 0.0 0.0 - 0.2 %  Basic metabolic panel  Result Value Ref Range   Sodium 138 135 - 145 mmol/L   Potassium  3.9 3.5 - 5.1 mmol/L   Chloride 102 98 - 111 mmol/L   CO2 27 22 - 32 mmol/L   Glucose, Bld 168 (H) 70 - 99 mg/dL   BUN 9 8 - 23 mg/dL   Creatinine, Ser 0.21  0.44 - 1.00 mg/dL   Calcium 8.9 8.9 - 75.8 mg/dL   GFR calc non Af Amer >60 >60 mL/min   GFR calc Af Amer >60 >60 mL/min   Anion gap 9 5 - 15  CBG monitoring, ED  Result Value Ref Range   Glucose-Capillary 201 (H) 70 - 99 mg/dL   DG Chest 2 View  Result Date: 11/23/2019 CLINICAL DATA:  Dyspnea on exertion. EXAM: CHEST - 2 VIEW COMPARISON:  July 21, 2016. FINDINGS: The heart size and mediastinal contours are within normal limits. Both lungs are clear. No pneumothorax or pleural effusion is noted. The visualized skeletal structures are unremarkable. IMPRESSION: No active cardiopulmonary disease. Electronically Signed   By: Lupita Raider M.D.   On: 11/23/2019 12:52    EKG EKG Interpretation  Date/Time:  Thursday November 23 2019 12:04:53 EDT Ventricular Rate:  106 PR Interval:  162 QRS Duration: 68 QT Interval:  350 QTC Calculation: 464 R Axis:   3 Text Interpretation: Sinus tachycardia Low voltage QRS Cannot rule out Anterior infarct , age undetermined Confirmed by Cy Blamer ([id number]) on 11/23/2019 11:05:03 PM   Radiology DG Chest 2 View  Result Date: 11/23/2019 CLINICAL DATA:  Dyspnea on exertion. EXAM: CHEST - 2 VIEW COMPARISON:  July 21, 2016. FINDINGS: The heart size and mediastinal contours are within normal limits. Both lungs are clear. No pneumothorax or pleural effusion is noted. The visualized skeletal structures are unremarkable. IMPRESSION: No active cardiopulmonary disease. Electronically Signed   By: Lupita Raider M.D.   On: 11/23/2019 12:52    Procedures Procedures (including critical care time)  Medications Ordered in ED Medications  alum & mag hydroxide-simeth (MAALOX/MYLANTA) 200-200-20 MG/5ML suspension 30 mL (has no administration in time range)  predniSONE (DELTASONE) tablet 40 mg (has no administration in time range)    ED Course  I have reviewed the triage vital signs and the nursing notes.  Pertinent labs & imaging results that were  available during my care of the patient were reviewed by me and considered in my medical decision making (see chart for details).    Given time course ruled out for MI.  No wheezing, oxygen saturation 100% on RA. I did not think this was a PE as there isn't constant SOB and there is no CP.  There is no CHF as a cause of wheezing.  I suspect this is more nasal congestion than anything.  However, given that it happens mostly at night I suspect a component of laryngeal reflux secondary to GERD.  Will start a PPI.  Given bronchitis changes seen on CT scan will start a short course of steroids and have patient continue her inhaler and follow up with her PMD for ongoing care.  Ruled out for MI.  Symptoms not consistent with ACS.    Jisella Ashenfelter was evaluated in Emergency Department on 11/24/2019 for the symptoms described in the history of present illness. She was evaluated in the context of the global COVID-19 pandemic, which necessitated consideration that the patient might be at risk for infection with the SARS-CoV-2 virus that causes COVID-19. Institutional protocols and algorithms that pertain to the evaluation of patients at risk for COVID-19 are in a state of rapid change  based on information released by regulatory bodies including the CDC and federal and state organizations. These policies and algorithms were followed during the patient's care in the ED.  Final Clinical Impression(s) / ED Diagnoses Return for intractable cough, coughing up blood,fevers >100.4 unrelieved by medication, shortness of breath, intractable vomiting, chest pain, shortness of breath, weakness,numbness, changes in speech, facial asymmetry,abdominal pain, passing out,Inability to tolerate liquids or food, cough, altered mental status or any concerns. No signs of systemic illness or infection. The patient is nontoxic-appearing on exam and vital signs are within normal limits.   I have reviewed the triage vital signs and  the nursing notes. Pertinent labs &imaging results that were available during my care of the patient were reviewed by me and considered in my medical decision making (see chart for details).After history, exam, and medical workup I feel the patient has beenappropriately medically screened and is safe for discharge home. Pertinent diagnoses were discussed with the patient. Patient was given return precautions.      Alainah Phang, MD 11/24/19 [phone]

## 2019-11-23 NOTE — Telephone Encounter (Signed)
Nurse Assessment from [facility] Nurse: Milana Na, RN, Donnamarie Poag Date/Time Lamount Cohen Time): 11/23/2019 9:51:33 AM Confirm and document reason for call. If symptomatic, describe symptoms. ---Caller reports that she has been wheezing for 2 weeks worse when she lays down. Caller states she has a history of asthma. States she is a little SOB. States wheezing is loud 11/23/2019 10:01:36 AM Go to ED Now (or PCP triage) Yes Milana Na, RN, Donnamarie Poag

## 2019-11-23 NOTE — Telephone Encounter (Signed)
Patient call and stated she is having trouble breathing and wheezing a lot.  Transfer patient to [facility] to speak with a nurse about her sx.

## 2019-11-24 ENCOUNTER — Emergency Department (HOSPITAL_COMMUNITY): Payer: Medicare Other

## 2019-11-24 DIAGNOSIS — J45909 Unspecified asthma, uncomplicated: Secondary | ICD-10-CM | POA: Diagnosis not present

## 2019-11-24 DIAGNOSIS — R0602 Shortness of breath: Secondary | ICD-10-CM | POA: Diagnosis not present

## 2019-11-24 DIAGNOSIS — J42 Unspecified chronic bronchitis: Secondary | ICD-10-CM | POA: Diagnosis not present

## 2019-11-24 LAB — TROPONIN I (HIGH SENSITIVITY)
Troponin I (High Sensitivity): 4 ng/L (ref <18)
Troponin I (High Sensitivity): 5 ng/L (ref <18)

## 2019-11-24 MED ORDER — PREDNISONE 20 MG PO TABS
ORAL_TABLET | ORAL | 0 refills | Status: DC
Start: 2019-11-24 — End: 2019-11-24

## 2019-11-24 MED ORDER — PREDNISONE 20 MG PO TABS
ORAL_TABLET | ORAL | 0 refills | Status: DC
Start: 2019-11-24 — End: 2019-12-16

## 2019-11-24 MED ORDER — OMEPRAZOLE 20 MG PO CPDR
20.0000 mg | DELAYED_RELEASE_CAPSULE | Freq: Every day | ORAL | 0 refills | Status: DC
Start: 2019-11-24 — End: 2019-12-16

## 2019-11-24 MED ORDER — IOHEXOL 350 MG/ML SOLN
80.0000 mL | Freq: Once | INTRAVENOUS | Status: AC | PRN
  Administered 2019-11-24: 80 mL via INTRAVENOUS

## 2019-12-05 ENCOUNTER — Other Ambulatory Visit: Payer: Self-pay | Admitting: Internal Medicine

## 2019-12-15 ENCOUNTER — Other Ambulatory Visit: Payer: Self-pay | Admitting: Internal Medicine

## 2019-12-16 NOTE — Progress Notes (Deleted)
Subjective:    Patient ID: Lindsay Hardy, female    DOB: 1950/06/26, 69 y.o.   MRN: [id number]  HPI The patient is here for an acute visit.  She has an appointment with [facility] pain management on 01/04/20.  Knee pain   Back pain  DM - appt with lindsey - ? rybelsus   Medications and allergies reviewed with patient and updated if appropriate.  Patient Active Problem List   Diagnosis Date Noted  . Allergic rhinitis 11/22/2018  . External hemorrhoids 10/07/2018  . Candidiasis 10/07/2018  . Chronic constipation 10/07/2018  . Morbid obesity ([place]) 04/01/2017  . PAD (peripheral artery disease) ([place]) 10/29/2016  . Chronic back pain 09/23/2016  . Diabetic peripheral neuropathy ([place]) 02/05/2016  . Uncontrolled diabetes mellitus ([place]) 06/17/2006  . Hyperlipidemia 06/17/2006  . Essential hypertension 06/17/2006  . Coronary atherosclerosis 06/17/2006  . DIASTOLIC DYSFUNCTION 14/48/1856  . DISEASE, CEREBROVASCULAR NEC 06/17/2006  . Osteoarthritis of both knees 06/17/2006    Current Outpatient Medications on File Prior to Visit  Medication Sig Dispense Refill  . Accu-Chek FastClix Lancets MISC USE TO CHECK BLOOD SUGARS 3 TIMES DAILY. E11.65 306 each 2  . ACCU-CHEK GUIDE test strip USE TO CHECK BLOOD SUGARS TWICE DAILY. E11.43 100 strip 5  . albuterol (PROAIR HFA) 108 (90 Base) MCG/ACT inhaler INHALE 2 PUFFS INTO THE LUNGS EVERY 4 (FOUR) HOURS AS NEEDED FOR SHORTNESS OF BREATH. 6.7 g 11  . amLODipine (NORVASC) 10 MG tablet Take 1 tablet (10 mg total) by mouth daily. 90 tablet 1  . aspirin 81 MG EC tablet TAKE 1 TABLET (81 MG TOTAL) BY MOUTH DAILY. 90 tablet 1  . Blood Glucose Monitoring Suppl (ONETOUCH VERIO IQ SYSTEM) w/Device KIT Use to check blood sugar once to twice daily. 1 kit 0  . diclofenac Sodium (VOLTAREN) 1 % GEL Apply 4 g topically 4 (four) times daily. 350 g 5  . fluticasone (FLONASE) 50 MCG/ACT nasal spray Place 2 sprays into both nostrils daily. 16 g 6  . gabapentin  (NEURONTIN) 300 MG capsule Take 1 capsule (300 mg total) by mouth 2 (two) times daily. 180 capsule 1  . glipiZIDE (GLUCOTROL) 10 MG tablet Take 1 tablet (10 mg total) by mouth 2 (two) times daily before a meal. 180 tablet 1  . hydrochlorothiazide (HYDRODIURIL) 25 MG tablet Take 1 tablet (25 mg total) by mouth daily. 90 tablet 1  . HYDROcodone-acetaminophen (NORCO) 10-325 MG tablet Take 1 tablet by mouth every 6 (six) hours.  0  . hydrocortisone (ANUSOL-HC) 2.5 % rectal cream Place 1 application rectally 2 (two) times daily. 30 g 5  . linaclotide (LINZESS) 145 MCG CAPS capsule TAKE 1 CAPSULE (145 MCG TOTAL) BY MOUTH EVERY MORNING. 90 capsule 0  . metFORMIN (GLUCOPHAGE-XR) 500 MG 24 hr tablet Take 2 tablets by mouth 2 times daily before a meal. 480 tablet 1  . Misc. Devices (BARIATRIC ROLLATOR) MISC Use as directed.  Dx  M54.9 1 each 0  . montelukast (SINGULAIR) 10 MG tablet TAKE 1 TABLET BY MOUTH EVERYDAY AT BEDTIME 90 tablet 1  . nystatin cream (MYCOSTATIN) Apply 1 application topically 2 (two) times daily. For stomach or under breasts 30 g 5  . omeprazole (PRILOSEC) 20 MG capsule Take 1 capsule (20 mg total) by mouth daily. 30 capsule 0  . potassium chloride (MICRO-K) 10 MEQ CR capsule Take 1 capsule by mouth twice a day. 180 capsule 1  . predniSONE (DELTASONE) 20 MG tablet 3 tabs po day one,  then 2 po daily x 4 days 11 tablet 0   No current facility-administered medications on file prior to visit.    Past Medical History:  Diagnosis Date  . Asthma   . CAD (coronary artery disease) 11/2003    RCA 60% stenosis, LAD diffuse  . Chicken pox   . CVA (cerebrovascular accident) [facility]) 2001    right frontoparietal cortical CVA - MRI September 2001  . Diabetes mellitus   . Diastolic dysfunction     ejection fraction 55-65% on 2-D echo May 2006  . Empty sella syndrome [facility])     based on MRI September 2001, related to obesity, hypertension, it was determined that patient has not required treatment  but will be monitored with TSH, ACTH, cortisol, testosterone, prolactin, growth hormone  . GERD (gastroesophageal reflux disease)     EGD June 2004 -  followed by Dr. Carlean Purl  . Granulomatous disease, chronic (HCC)     in right upper lobe per x-ray December 2005  . Hyperlipidemia   . Hypertension   . NAFLD (nonalcoholic fatty liver disease)     based on ultrasound June 2002, elevated transaminase  . OA (osteoarthritis)     DJP coracoclavicular ligament, left patella osteophytes June 2002, L4-S1 spondylosis, degenerative disc disease with bulge. I in October 2001, the C2-C4 spondylosis without stenosis and with spurs based on x-ray July 2005; diffuse idiopathic skeletal hyperostosis with bilateral hip lumbar degenerative changes   on x-ray February 2005, bilateral plantar calcaneal spurs  . Reactive airway disease     peak flow 180 in May 2005, chronic sinusitis with PND, bronchitis December 2002 and strep pneumonia April 2007,  FEC 66, MCV 60, ratio is 69, DLCO 57,  moderate restrictive and mild obstructive disease  on spirometry May 2005    Past Surgical History:  Procedure Laterality Date  . CARPAL TUNNEL RELEASE  1992    left arm 1992  . CHOLECYSTECTOMY  2003  . TONSILLECTOMY AND ADENOIDECTOMY      Social History   Socioeconomic History  . Marital status: Single    Spouse name: Not on file  . Number of children: 1  . Years of education: 99  . Highest education level: Not on file  Occupational History  . Occupation:  former Psychologist, counselling  Tobacco Use  . Smoking status: Former Smoker    Packs/day: 0.25    Years: 4.00    Pack years: 1.00    Types: Cigarettes  . Smokeless tobacco: Never Used  . Tobacco comment: smoked in 20's   Vaping Use  . Vaping Use: Never used  Substance and Sexual Activity  . Alcohol use: No    Alcohol/week: 0.0 standard drinks  . Drug use: No  . Sexual activity: Not Currently    Birth control/protection: Post-menopausal  Other Topics Concern    . Not on file  Social History Narrative   Born and raised in [place], [place]. Currently lives in a private residence with her nephew and his wife. Fun: Church activities, spending time with family   Denies any religious beliefs that would effect health care.    Social Determinants of Health   Financial Resource Strain:   . Difficulty of Paying Living Expenses:   Food Insecurity:   . Worried About Charity fundraiser in the Last Year:   . Arboriculturist in the Last Year:   Transportation Needs:   . Film/video editor (Medical):   Marland Kitchen Lack of Transportation (Non-Medical):  Physical Activity:   . Days of Exercise per Week:   . Minutes of Exercise per Session:   Stress:   . Feeling of Stress :   Social Connections:   . Frequency of Communication with Friends and Family:   . Frequency of Social Gatherings with Friends and Family:   . Attends Religious Services:   . Active Member of Clubs or Organizations:   . Attends Archivist Meetings:   Marland Kitchen Marital Status:     Family History  Problem Relation Age of Onset  . Lung cancer Mother   . Hypertension Mother   . Lung cancer Sister   . Hypertension Sister     Review of Systems     Objective:  There were no vitals filed for this visit. BP Readings from Last 3 Encounters:  11/24/19 (!) 167/123  11/01/19 (!) 146/90  06/29/18 128/62   Wt Readings from Last 3 Encounters:  11/23/19 268 lb (121.6 kg)  11/01/19 268 lb (121.6 kg)  06/29/18 275 lb 6.4 oz (124.9 kg)   There is no height or weight on file to calculate BMI.   Physical Exam         Assessment & Plan:    See Problem List for Assessment and Plan of chronic medical problems.    This visit occurred during the SARS-CoV-2 public health emergency.  Safety protocols were in place, including screening questions prior to the visit, additional usage of staff PPE, and extensive cleaning of exam room while observing appropriate contact time as indicated for  disinfecting solutions.

## 2019-12-17 ENCOUNTER — Other Ambulatory Visit: Payer: Self-pay | Admitting: Internal Medicine

## 2019-12-18 ENCOUNTER — Ambulatory Visit: Payer: Medicare Other | Admitting: Internal Medicine

## 2019-12-24 DIAGNOSIS — J42 Unspecified chronic bronchitis: Secondary | ICD-10-CM | POA: Diagnosis not present

## 2019-12-24 DIAGNOSIS — J45909 Unspecified asthma, uncomplicated: Secondary | ICD-10-CM | POA: Diagnosis not present

## 2019-12-29 ENCOUNTER — Encounter: Payer: Self-pay | Admitting: Family

## 2019-12-29 ENCOUNTER — Other Ambulatory Visit: Payer: Self-pay

## 2019-12-29 ENCOUNTER — Ambulatory Visit (INDEPENDENT_AMBULATORY_CARE_PROVIDER_SITE_OTHER): Payer: Medicare Other | Admitting: Family

## 2019-12-29 VITALS — BP 130/70 | HR 105 | Temp 98.9°F | Ht 65.0 in | Wt 270.0 lb

## 2019-12-29 DIAGNOSIS — M5442 Lumbago with sciatica, left side: Secondary | ICD-10-CM

## 2019-12-29 DIAGNOSIS — G8929 Other chronic pain: Secondary | ICD-10-CM

## 2019-12-29 DIAGNOSIS — M5441 Lumbago with sciatica, right side: Secondary | ICD-10-CM

## 2019-12-29 MED ORDER — HYDROCODONE-ACETAMINOPHEN 10-325 MG PO TABS: 1.0000 | ORAL_TABLET | Freq: Four times a day (QID) | ORAL | 0 refills | Status: AC | PRN

## 2019-12-29 NOTE — Patient Instructions (Signed)
01/04/2020 Initial consult Pain Medicine Dannielle Huh, MD  [address]  [address]  [place], [place] [address]  [phone]  [phone] (Fax)

## 2019-12-29 NOTE — Progress Notes (Signed)
Lindsay Hardy is a 69 y.o. female with the following history as recorded in EpicCare:  Patient Active Problem List   Diagnosis Date Noted   Allergic rhinitis 11/22/2018   External hemorrhoids 10/07/2018   Candidiasis 10/07/2018   Chronic constipation 10/07/2018   Morbid obesity ([place]) 04/01/2017   PAD (peripheral artery disease) ([place]) 10/29/2016   Chronic back pain 09/23/2016   Diabetic peripheral neuropathy ([place]) 02/05/2016   Uncontrolled diabetes mellitus ([place]) 06/17/2006   Hyperlipidemia 06/17/2006   Essential hypertension 06/17/2006   Coronary atherosclerosis 24/02/7352   DIASTOLIC DYSFUNCTION 29/92/4268   DISEASE, CEREBROVASCULAR NEC 06/17/2006   Osteoarthritis of both knees 06/17/2006    Current Outpatient Medications  Medication Sig Dispense Refill   Accu-Chek FastClix Lancets MISC USE TO CHECK BLOOD SUGARS 3 TIMES DAILY. E11.65 306 each 2   ACCU-CHEK GUIDE test strip USE TO CHECK BLOOD SUGARS TWICE DAILY. E11.43 100 strip 5   albuterol (PROAIR HFA) 108 (90 Base) MCG/ACT inhaler INHALE 2 PUFFS INTO THE LUNGS EVERY 4 (FOUR) HOURS AS NEEDED FOR SHORTNESS OF BREATH. 6.7 g 11   amLODipine (NORVASC) 10 MG tablet Take 1 tablet (10 mg total) by mouth daily. 90 tablet 1   Blood Glucose Monitoring Suppl (ONETOUCH VERIO IQ SYSTEM) w/Device KIT Use to check blood sugar once to twice daily. 1 kit 0   diclofenac Sodium (VOLTAREN) 1 % GEL Apply 4 g topically 4 (four) times daily. 350 g 5   fluticasone (FLONASE) 50 MCG/ACT nasal spray Place 2 sprays into both nostrils daily. 16 g 6   gabapentin (NEURONTIN) 300 MG capsule Take 1 capsule (300 mg total) by mouth 2 (two) times daily. 180 capsule 1   glipiZIDE (GLUCOTROL) 10 MG tablet Take 1 tablet (10 mg total) by mouth 2 (two) times daily before a meal. 180 tablet 1   hydrochlorothiazide (HYDRODIURIL) 25 MG tablet Take 1 tablet (25 mg total) by mouth daily. 90 tablet 1   HYDROcodone-acetaminophen (NORCO) 10-325 MG  tablet Take 1 tablet by mouth every 6 (six) hours as needed for up to 7 days for severe pain. 28 tablet 0   hydrocortisone (ANUSOL-HC) 2.5 % rectal cream Place 1 application rectally 2 (two) times daily. 30 g 5   linaclotide (LINZESS) 145 MCG CAPS capsule TAKE 1 CAPSULE (145 MCG TOTAL) BY MOUTH EVERY MORNING. 90 capsule 0   metFORMIN (GLUCOPHAGE-XR) 500 MG 24 hr tablet Take 2 tablets by mouth 2 times daily before a meal. 480 tablet 1   Misc. Devices (BARIATRIC ROLLATOR) MISC Use as directed.  Dx  M54.9 1 each 0   montelukast (SINGULAIR) 10 MG tablet TAKE 1 TABLET BY MOUTH EVERYDAY AT BEDTIME 90 tablet 1   nystatin cream (MYCOSTATIN) Apply 1 application topically 2 (two) times daily. For stomach or under breasts 30 g 5   potassium chloride (MICRO-K) 10 MEQ CR capsule Take 1 capsule by mouth twice a day. 180 capsule 1   aspirin 81 MG EC tablet TAKE 1 TABLET (81 MG TOTAL) BY MOUTH DAILY. 90 tablet 1   No current facility-administered medications for this visit.    Allergies: Atorvastatin, Clopidogrel bisulfate, Crestor [rosuvastatin calcium], Fexofenadine, Furosemide, Hydrochlorothiazide w-triamterene, Lisinopril, Metoprolol tartrate, Mometasone furoate, and Ozempic (0.25 or 0.5 mg-dose) [semaglutide(0.25 or 0.30m-dos)]  Past Medical History:  Diagnosis Date   Asthma    CAD (coronary artery disease) 11/2003    RCA 60% stenosis, LAD diffuse   Chicken pox    [place]rebro[place]    right frontoparietal cortical  CVA - MRI September 2001   Diabetes mellitus    Diastolic dysfunction     ejection fraction 55-65% on 2-D echo May 2006   Empty sella syndrome [facility])     based on MRI September 2001, related to obesity, hypertension, it was determined that patient has not required treatment but will be monitored with TSH, ACTH, cortisol, testosterone, prolactin, growth hormone   GERD (gastroesophageal reflux disease)     EGD June 2004 -  followed by Dr. Carlean Purl    Granulomatous disease, chronic ([place])     in right upper lobe per x-ray December 2005   Hyperlipidemia    Hypertension    NAFLD (nonalcoholic fatty liver disease)     based on ultrasound June 2002, elevated transaminase   OA (osteoarthritis)     DJP coracoclavicular ligament, left patella osteophytes June 2002, L4-S1 spondylosis, degenerative disc disease with bulge. I in October 2001, the C2-C4 spondylosis without stenosis and with spurs based on x-ray July 2005; diffuse idiopathic skeletal hyperostosis with bilateral hip lumbar degenerative changes   on x-ray February 2005, bilateral plantar calcaneal spurs   Reactive airway disease     peak flow 180 in May 2005, chronic sinusitis with PND, bronchitis December 2002 and strep pneumonia April 2007,  FEC 66, MCV 60, ratio is 69, DLCO 57,  moderate restrictive and mild obstructive disease  on spirometry May 2005    Past Surgical History:  Procedure Laterality Date   CARPAL [place]    left arm 1992   CHOLECYSTECTOMY  2003   TONSILLECTOMY AND ADENOIDECTOMY      Family History  Problem Relation Age of Onset   Lung cancer Mother    Hypertension Mother    Lung cancer Sister    Hypertension Sister     Social History   Tobacco Use   Smoking status: Former Smoker    Packs/day: 0.25    Years: 4.00    Pack years: 1.00    Types: Cigarettes   Smokeless tobacco: Never Used   Tobacco comment: smoked in 20's   Substance Use Topics   Alcohol use: No    Alcohol/week: 0.0 standard drinks    Subjective:  Patient has chronic low back pain; her recent pain provider has retired- last OV there was in May; in the process of transferring to a provider at [facility]; notes she is out of Norco for the past week and is having extreme pain; has attempted to cut her Norco back from 6 x per day to 4 x per day; Is scheduled to see the [facility] pain management provider on 7/29- this is verified by [facility] scheduling;  PMDP  is reviewed and fill history is consistent with provider in [place] who has since retired;     Objective:  Vitals:   12/29/19 1401  BP: (!) 130/70  Pulse: 105  Temp: 98.9 F (37.2 C)  TempSrc: Oral  SpO2: 97%  Weight: (!) 270 lb (122.5 kg)  Height: _0  (1.651 m)    General: Well developed, well nourished, in no acute distress  Skin : Warm and dry.  Head: Normocephalic and atraumatic  Lungs: Respirations unlabored; clear to auscultation bilaterally without wheeze, rales, rhonchi  Musculoskeletal: No deformities; no active joint inflammation  Extremities: No edema, cyanosis, clubbing  Vessels: Symmetric bilaterally  Neurologic: Alert and oriented; speech intact; face symmetrical; in wheelchair   Assessment:  1. Chronic midline low back pain with bilateral sciatica  Plan:  Worsening pain due to inability to get her medication; In the process of establishing with new pain management provider; she understands we can only give her a 1 time refill of the Norco and for no more than 7 days; refill on Norco 10/325 1 po q 6 hours #28/ 0 Rf; Keep planned follow-up with her pain management provider and her PCP for later in August; PMDP was reviewed and appropriate;   This visit occurred during the SARS-CoV-2 public health emergency.  Safety protocols were in place, including screening questions prior to the visit, additional usage of staff PPE, and extensive cleaning of exam room while observing appropriate contact time as indicated for disinfecting solutions.      No follow-ups on file.  No orders of the defined types were placed in this encounter.   Requested Prescriptions   Signed Prescriptions Disp Refills   HYDROcodone-acetaminophen (NORCO) 10-325 MG tablet 28 tablet 0    Sig: Take 1 tablet by mouth every 6 (six) hours as needed for up to 7 days for severe pain.

## 2020-01-01 ENCOUNTER — Telehealth: Payer: Self-pay | Admitting: Internal Medicine

## 2020-01-01 DIAGNOSIS — M47816 Spondylosis without myelopathy or radiculopathy, lumbar region: Secondary | ICD-10-CM | POA: Diagnosis not present

## 2020-01-01 DIAGNOSIS — M5441 Lumbago with sciatica, right side: Secondary | ICD-10-CM | POA: Diagnosis not present

## 2020-01-01 DIAGNOSIS — J42 Unspecified chronic bronchitis: Secondary | ICD-10-CM | POA: Diagnosis not present

## 2020-01-01 DIAGNOSIS — J45909 Unspecified asthma, uncomplicated: Secondary | ICD-10-CM | POA: Diagnosis not present

## 2020-01-01 DIAGNOSIS — M5442 Lumbago with sciatica, left side: Secondary | ICD-10-CM | POA: Diagnosis not present

## 2020-01-01 DIAGNOSIS — M170 Bilateral primary osteoarthritis of knee: Secondary | ICD-10-CM | POA: Diagnosis not present

## 2020-01-01 NOTE — Telephone Encounter (Signed)
Okay with me 

## 2020-01-01 NOTE — Telephone Encounter (Signed)
Pt want to change from dr.stacy Burns to [place].

## 2020-01-04 ENCOUNTER — Telehealth: Payer: Self-pay

## 2020-01-04 DIAGNOSIS — M5441 Lumbago with sciatica, right side: Secondary | ICD-10-CM

## 2020-01-04 DIAGNOSIS — G8929 Other chronic pain: Secondary | ICD-10-CM

## 2020-01-04 NOTE — Telephone Encounter (Signed)
New message    Need a referral to [facility] [address] was told will need a new referral.   Not satisfied with [facility].

## 2020-01-08 NOTE — Telephone Encounter (Signed)
Not accepting new patients at this time.

## 2020-01-15 DIAGNOSIS — M5442 Lumbago with sciatica, left side: Secondary | ICD-10-CM | POA: Diagnosis not present

## 2020-01-15 DIAGNOSIS — M5441 Lumbago with sciatica, right side: Secondary | ICD-10-CM | POA: Diagnosis not present

## 2020-01-15 DIAGNOSIS — M170 Bilateral primary osteoarthritis of knee: Secondary | ICD-10-CM | POA: Diagnosis not present

## 2020-01-15 DIAGNOSIS — G8929 Other chronic pain: Secondary | ICD-10-CM | POA: Diagnosis not present

## 2020-01-15 NOTE — Telephone Encounter (Signed)
Patient has an appointment in [facility] on 8.30.21. Patient does not like that location and would prefer to go to  [facility] [address] [place], [place] [address] [phone]  Patient needs a referral

## 2020-01-16 NOTE — Telephone Encounter (Signed)
A referral was faxed to [name] on 8/3.

## 2020-01-16 NOTE — Telephone Encounter (Signed)
Referral refaxed again today-  Tried to call [name] on 8/4 & 8/5 to make sure they received fax - was on hold for long time then was cut off - on 8/6 pressed another option but was able to leave vm - still haven't heard back from them.

## 2020-01-22 ENCOUNTER — Other Ambulatory Visit: Payer: Self-pay | Admitting: Internal Medicine

## 2020-01-23 NOTE — Telephone Encounter (Signed)
Spoke with [name] on 8/16 and was told they did receive referral. She would be a returning pt and her referral has been forwarded to the provider to review notes. They are waiting on providers response.  Pt is aware

## 2020-01-24 DIAGNOSIS — J42 Unspecified chronic bronchitis: Secondary | ICD-10-CM | POA: Diagnosis not present

## 2020-01-24 DIAGNOSIS — J45909 Unspecified asthma, uncomplicated: Secondary | ICD-10-CM | POA: Diagnosis not present

## 2020-01-29 ENCOUNTER — Other Ambulatory Visit: Payer: Self-pay | Admitting: Internal Medicine

## 2020-01-30 NOTE — Progress Notes (Deleted)
Subjective:    Patient ID: Lindsay Hardy, female    DOB: [birth year], 69 y.o.   MRN: [id number]  HPI The patient is here for follow up of their chronic medical problems, including DM with neuropathy, htn, hyperlipidemia, chronic back pain, obesity  She is taking all of her medications as prescribed.   She is not exercising regularly.     Rybelsus, bydureon, jardiance  Medications and allergies reviewed with patient and updated if appropriate.  Patient Active Problem List   Diagnosis Date Noted   Allergic rhinitis 11/22/2018   External hemorrhoids 10/07/2018   Candidiasis 10/07/2018   Chronic constipation 10/07/2018   Morbid obesity ([place]) 04/01/2017   PAD (peripheral artery disease) ([place]) 10/29/2016   Chronic back pain 09/23/2016   Diabetic peripheral neuropathy ([place]) 02/05/2016   Uncontrolled diabetes mellitus ([place]) 06/17/2006   Hyperlipidemia 06/17/2006   Essential hypertension 06/17/2006   Coronary atherosclerosis 79/08/8331   DIASTOLIC DYSFUNCTION 83/29/1916   DISEASE, CEREBROVASCULAR NEC 06/17/2006   Osteoarthritis of both knees 06/17/2006    Current Outpatient Medications on File Prior to Visit  Medication Sig Dispense Refill   Accu-Chek FastClix Lancets MISC USE TO CHECK BLOOD SUGARS 3 TIMES DAILY. E11.65 306 each 2   ACCU-CHEK GUIDE test strip USE TO CHECK BLOOD SUGARS TWICE DAILY. E11.43 100 strip 5   albuterol (VENTOLIN HFA) 108 (90 Base) MCG/ACT inhaler INHALE 2 PUFFS INTO THE LUNGS EVERY 4 (FOUR) HOURS AS NEEDED FOR SHORTNESS OF BREATH. 6.7 g 11   amLODipine (NORVASC) 10 MG tablet Take 1 tablet (10 mg total) by mouth daily. 90 tablet 1   aspirin 81 MG EC tablet TAKE 1 TABLET (81 MG TOTAL) BY MOUTH DAILY. 90 tablet 1   Blood Glucose Monitoring Suppl (ONETOUCH VERIO IQ SYSTEM) w/Device KIT Use to check blood sugar once to twice daily. 1 kit 0   diclofenac Sodium (VOLTAREN) 1 % GEL Apply 4 g topically 4 (four) times daily. 350 g 5    fluticasone (FLONASE) 50 MCG/ACT nasal spray Place 2 sprays into both nostrils daily. 16 g 6   gabapentin (NEURONTIN) 300 MG capsule Take 1 capsule (300 mg total) by mouth 2 (two) times daily. 180 capsule 1   glipiZIDE (GLUCOTROL) 10 MG tablet Take 1 tablet (10 mg total) by mouth 2 (two) times daily before a meal. 180 tablet 1   hydrochlorothiazide (HYDRODIURIL) 25 MG tablet Take 1 tablet (25 mg total) by mouth daily. 90 tablet 1   hydrocortisone (ANUSOL-HC) 2.5 % rectal cream Place 1 application rectally 2 (two) times daily. 30 g 5   LINZESS 145 MCG CAPS capsule TAKE 1 CAPSULE (145 MCG TOTAL) BY MOUTH EVERY MORNING. 90 capsule 0   metFORMIN (GLUCOPHAGE-XR) 500 MG 24 hr tablet Take 2 tablets by mouth 2 times daily before a meal. 480 tablet 1   Misc. Devices (BARIATRIC ROLLATOR) MISC Use as directed.  Dx  M54.9 1 each 0   montelukast (SINGULAIR) 10 MG tablet TAKE 1 TABLET BY MOUTH EVERYDAY AT BEDTIME 90 tablet 1   nystatin cream (MYCOSTATIN) Apply 1 application topically 2 (two) times daily. For stomach or under breasts 30 g 5   potassium chloride (MICRO-K) 10 MEQ CR capsule Take 1 capsule by mouth twice a day. 180 capsule 1   No current facility-administered medications on file prior to visit.    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 Carlean Purl   Granulomatous  disease, chronic ([place])     in right upper lobe per x-ray December 2005   Hyperlipidemia    Hypertension    NAFLD (nonalcoholic fatty liver disease)     based on ultrasound June 2002, elevated transaminase   OA (osteoarthritis)     DJP coracoclavicular ligament, left patella osteophytes June 2002, L4-S1 spondylosis, degenerative disc disease with bulge. I in October 2001, the C2-C4 spondylosis without stenosis and with spurs based on x-ray July 2005; diffuse idiopathic skeletal hyperostosis with bilateral hip lumbar degenerative changes   on x-ray February 2005, bilateral plantar calcaneal spurs   Reactive airway disease     peak flow 180 in May 2005, chronic sinusitis with PND, bronchitis December 2002 and strep pneumonia April 2007,  FEC 66, MCV 60, ratio is 69, DLCO 57,  moderate restrictive and mild obstructive disease  on spirometry May 2005    Past Surgical History:  Procedure Laterality Date   CARPAL [place]    left arm 1992   CHOLECYSTECTOMY  2003   TONSILLECTOMY AND ADENOIDECTOMY      Social History   Socioeconomic History   Marital status: Single    Spouse name: Not on file   Number of children: 1   Years of education: 14   Highest education level: Not on file  Occupational History   Occupation:  former Psychologist, counselling  Tobacco Use   Smoking status: Former Smoker    Packs/day: 0.25    Years: 4.00    Pack years: 1.00    Types: Cigarettes   Smokeless tobacco: Never Used   Tobacco comment: smoked in 20's   Vaping Use   Vaping Use: Never used  Substance and Sexual Activity   Alcohol use: No    Alcohol/week: 0.0 standard drinks   Drug use: No   Sexual activity: Not Currently    Birth control/protection: Post-menopausal  Other Topics Concern   Not on file  Social History Narrative   Born and raised in [place], [place]. Currently lives in a private residence with her nephew and his wife. Fun: Church activities, spending time with  family   Denies any religious beliefs that would effect health care.    Social Determinants of Health   Financial Resource Strain:    Difficulty of Paying Living Expenses: Not on file  Food Insecurity:    Worried About Charity fundraiser in the Last Year: Not on file   [employer] of Food in the Last Year: Not on file  Transportation Needs:    Lack of Transportation (Medical): Not on file   Lack of Transportation (Non-Medical): Not on file  Physical Activity:    Days of Exercise per Week: Not on file   Minutes of Exercise per Session: Not on file  Stress:    Feeling of Stress : Not on file  Social Connections:  Frequency of Communication with Friends and Family: Not on file   Frequency of Social Gatherings with Friends and Family: Not on file   Attends Religious Services: Not on file   Active Member of Clubs or Organizations: Not on file   Attends Archivist Meetings: Not on file   Marital Status: Not on file    Family History  Problem Relation Age of Onset   Lung cancer Mother    Hypertension Mother    Lung cancer Sister    Hypertension Sister     Review of Systems     Objective:  There were no vitals filed for this visit. BP Readings from Last 3 Encounters:  12/29/19 (!) 130/70  11/24/19 (!) 167/123  11/01/19 (!) 146/90   Wt Readings from Last 3 Encounters:  12/29/19 (!) 270 lb (122.5 kg)  11/23/19 268 lb (121.6 kg)  11/01/19 268 lb (121.6 kg)   There is no height or weight on file to calculate BMI.   Physical Exam    Constitutional: Appears well-developed and well-nourished. No distress.  HENT:  Head: Normocephalic and atraumatic.  Neck: Neck supple. No tracheal deviation present. No thyromegaly present.  No cervical lymphadenopathy Cardiovascular: Normal rate, regular rhythm and normal heart sounds.   No murmur heard. No carotid bruit .  No edema Pulmonary/Chest: Effort normal and breath sounds normal. No respiratory distress.  No has no wheezes. No rales.  Skin: Skin is warm and dry. Not diaphoretic.  Psychiatric: Normal mood and affect. Behavior is normal.      Assessment & Plan:    See Problem List for Assessment and Plan of chronic medical problems.    This visit occurred during the SARS-CoV-2 public health emergency.  Safety protocols were in place, including screening questions prior to the visit, additional usage of staff PPE, and extensive cleaning of exam room while observing appropriate contact time as indicated for disinfecting solutions.

## 2020-01-31 ENCOUNTER — Encounter: Payer: Self-pay | Admitting: Internal Medicine

## 2020-01-31 ENCOUNTER — Ambulatory Visit (INDEPENDENT_AMBULATORY_CARE_PROVIDER_SITE_OTHER): Payer: Medicare Other | Admitting: Internal Medicine

## 2020-01-31 DIAGNOSIS — E1142 Type 2 diabetes mellitus with diabetic polyneuropathy: Secondary | ICD-10-CM | POA: Diagnosis not present

## 2020-01-31 DIAGNOSIS — G8929 Other chronic pain: Secondary | ICD-10-CM

## 2020-01-31 DIAGNOSIS — E1165 Type 2 diabetes mellitus with hyperglycemia: Secondary | ICD-10-CM | POA: Diagnosis not present

## 2020-01-31 DIAGNOSIS — M5441 Lumbago with sciatica, right side: Secondary | ICD-10-CM

## 2020-01-31 DIAGNOSIS — M7989 Other specified soft tissue disorders: Secondary | ICD-10-CM

## 2020-01-31 DIAGNOSIS — E782 Mixed hyperlipidemia: Secondary | ICD-10-CM

## 2020-01-31 DIAGNOSIS — I10 Essential (primary) hypertension: Secondary | ICD-10-CM | POA: Diagnosis not present

## 2020-01-31 DIAGNOSIS — M5442 Lumbago with sciatica, left side: Secondary | ICD-10-CM

## 2020-01-31 MED ORDER — GABAPENTIN 300 MG PO CAPS: 300.0000 mg | ORAL_CAPSULE | Freq: Two times a day (BID) | ORAL | 1 refills | Status: DC

## 2020-01-31 MED ORDER — MONTELUKAST SODIUM 10 MG PO TABS: ORAL_TABLET | ORAL | 1 refills | Status: DC

## 2020-01-31 MED ORDER — HYDROCHLOROTHIAZIDE 25 MG PO TABS: 25.0000 mg | ORAL_TABLET | Freq: Every day | ORAL | 1 refills | Status: DC

## 2020-01-31 MED ORDER — LINACLOTIDE 145 MCG PO CAPS: ORAL_CAPSULE | ORAL | 1 refills | Status: DC

## 2020-01-31 MED ORDER — GLIPIZIDE 10 MG PO TABS: 10.0000 mg | ORAL_TABLET | Freq: Two times a day (BID) | ORAL | 1 refills | Status: DC

## 2020-01-31 MED ORDER — POTASSIUM CHLORIDE ER 10 MEQ PO CPCR: ORAL_CAPSULE | ORAL | 1 refills | Status: DC

## 2020-01-31 MED ORDER — METFORMIN HCL ER 500 MG PO TB24: ORAL_TABLET | ORAL | 1 refills | Status: DC

## 2020-01-31 MED ORDER — AMLODIPINE BESYLATE 10 MG PO TABS: 10.0000 mg | ORAL_TABLET | Freq: Every day | ORAL | 1 refills | Status: DC

## 2020-01-31 NOTE — Assessment & Plan Note (Signed)
Chronic Stressed compliance with a diabetic diet Encourage exercise and weight loss Stressed that we need to get her sugars better controlled We will check A1c Advised that we will need to try other medications and find something that is not expensive and that she tolerates She will go for blood work

## 2020-01-31 NOTE — Assessment & Plan Note (Signed)
Chronic BP well controlled when checked at pain management Current regimen effective and well tolerated Continue current medications at current doses cmp

## 2020-01-31 NOTE — Assessment & Plan Note (Signed)
Chronic Has been statin intolerant Cholesterol overall not that bad Work on lifestyle changes

## 2020-01-31 NOTE — Assessment & Plan Note (Signed)
Management per pain management.  

## 2020-01-31 NOTE — Assessment & Plan Note (Signed)
Chronic Again stressed the importance of weight loss Try to exercise, but she is very limited by her pain Work on maintain a diabetic diet and decrease portions

## 2020-01-31 NOTE — Assessment & Plan Note (Signed)
Chronic Continue gabapentin Refill today

## 2020-01-31 NOTE — Progress Notes (Signed)
Virtual Visit via telephone Note  I connected with Lindsay Hardy on 01/31/20 at  2:30 PM EDT by telephone and verified that I am speaking with the correct person using two identifiers.   I discussed the limitations of evaluation and management by telemedicine and the availability of in person appointments. The patient expressed understanding and agreed to proceed.  Present for the visit:  Myself, Dr Cheryll Cockayne, [name].  The patient is currently at home and I am in the office.    No referring provider.    History of Present Illness: The patient is here for follow up of their chronic medical problems, including DM with neuropathy, htn, hyperlipidemia, chronic back pain, obesity   She is taking all of her medications as prescribed.   She is not exercising regularly.     Her chronic pain is hurting so bad - she can hardly do anything.  She is following with pain management.  She is concerned about bilateral leg tightness and pain.  There is some swelling which is newer.  She is very concerned about the possibility of blood clots and wonders about an ultrasound.  She feels she is eating well overall.  She is trying to watch her sugars.   Review of Systems  Constitutional: Negative for chills and fever.  Respiratory: Positive for shortness of breath and wheezing. Negative for cough.   Cardiovascular: Positive for leg swelling. Negative for chest pain and palpitations.  Neurological: Positive for dizziness (occ) and headaches (occ).     Social History   Socioeconomic History  . Marital status: Single    Spouse name: Not on file  . Number of children: 1  . Years of education: 73  . Highest education level: Not on file  Occupational History  . Occupation:  former Agricultural engineer  Tobacco Use  . Smoking status: Former Smoker    Packs/day: 0.25    Years: 4.00    Pack years: 1.00    Types: Cigarettes  . Smokeless tobacco: Never Used  . Tobacco comment: smoked in 20's    Vaping Use  . Vaping Use: Never used  Substance and Sexual Activity  . Alcohol use: No    Alcohol/week: 0.0 standard drinks  . Drug use: No  . Sexual activity: Not Currently    Birth control/protection: Post-menopausal  Other Topics Concern  . Not on file  Social History Narrative   Born and raised in [place], [place]. Currently lives in a private residence with her nephew and his wife. Fun: Church activities, spending time with family   Denies any religious beliefs that would effect health care.    Social Determinants of Health   Financial Resource Strain:   . Difficulty of Paying Living Expenses: Not on file  Food Insecurity:   . Worried About Programme researcher, broadcasting/film/video in the Last Year: Not on file  . Ran Out of Food in the Last Year: Not on file  Transportation Needs:   . Lack of Transportation (Medical): Not on file  . Lack of Transportation (Non-Medical): Not on file  Physical Activity:   . Days of Exercise per Week: Not on file  . Minutes of Exercise per Session: Not on file  Stress:   . Feeling of Stress : Not on file  Social Connections:   . Frequency of Communication with Friends and Family: Not on file  . Frequency of Social Gatherings with Friends and Family: Not on file  . Attends Religious Services: Not on file  .  Active Member of Clubs or Organizations: Not on file  . Attends Banker Meetings: Not on file  . Marital Status: Not on file     Observations/Objective:   Assessment and Plan:  See Problem List for Assessment and Plan of chronic medical problems.   Follow Up Instructions:    I discussed the assessment and treatment plan with the patient. The patient was provided an opportunity to ask questions and all were answered. The patient agreed with the plan and demonstrated an understanding of the instructions.   The patient was advised to call back or seek an in-person evaluation if the symptoms worsen or if the condition fails to improve as  anticipated.  Time spent on telephone call:  16 minutes  Pincus Sanes, MD

## 2020-02-01 ENCOUNTER — Other Ambulatory Visit: Payer: Self-pay

## 2020-02-01 ENCOUNTER — Ambulatory Visit (HOSPITAL_COMMUNITY)
Admission: RE | Admit: 2020-02-01 | Discharge: 2020-02-01 | Disposition: A | Discharge status: Home / Self Care | Payer: Medicare Other | Source: Ambulatory Visit | Attending: Cardiology | Admitting: Cardiology

## 2020-02-01 DIAGNOSIS — M7989 Other specified soft tissue disorders: Secondary | ICD-10-CM | POA: Diagnosis not present

## 2020-02-06 DIAGNOSIS — M47816 Spondylosis without myelopathy or radiculopathy, lumbar region: Secondary | ICD-10-CM | POA: Diagnosis not present

## 2020-02-06 DIAGNOSIS — M1712 Unilateral primary osteoarthritis, left knee: Secondary | ICD-10-CM | POA: Diagnosis not present

## 2020-02-06 DIAGNOSIS — M25761 Osteophyte, right knee: Secondary | ICD-10-CM | POA: Diagnosis not present

## 2020-02-06 DIAGNOSIS — G8929 Other chronic pain: Secondary | ICD-10-CM | POA: Diagnosis not present

## 2020-02-06 DIAGNOSIS — M545 Low back pain: Secondary | ICD-10-CM | POA: Diagnosis not present

## 2020-02-06 DIAGNOSIS — M170 Bilateral primary osteoarthritis of knee: Secondary | ICD-10-CM | POA: Diagnosis not present

## 2020-02-06 DIAGNOSIS — M5442 Lumbago with sciatica, left side: Secondary | ICD-10-CM | POA: Diagnosis not present

## 2020-02-06 DIAGNOSIS — M5441 Lumbago with sciatica, right side: Secondary | ICD-10-CM | POA: Diagnosis not present

## 2020-02-06 DIAGNOSIS — M778 Other enthesopathies, not elsewhere classified: Secondary | ICD-10-CM | POA: Diagnosis not present

## 2020-02-06 DIAGNOSIS — S83142A Lateral subluxation of proximal end of tibia, left knee, initial encounter: Secondary | ICD-10-CM | POA: Diagnosis not present

## 2020-02-06 DIAGNOSIS — M160 Bilateral primary osteoarthritis of hip: Secondary | ICD-10-CM | POA: Diagnosis not present

## 2020-02-08 ENCOUNTER — Telehealth: Payer: Self-pay | Admitting: Internal Medicine

## 2020-02-08 NOTE — Telephone Encounter (Signed)
Will hold for MD response on her return tomorrow.Marland KitchenRaechel Chute

## 2020-02-08 NOTE — Telephone Encounter (Signed)
Patient called to ask provider if it is okay for her to take a OTC medication called milk thistle liver detox.

## 2020-02-08 NOTE — Telephone Encounter (Signed)
She can take the milk thistle with her current medications.

## 2020-02-09 NOTE — Telephone Encounter (Signed)
Called pt, LVM with details below  

## 2020-02-19 DIAGNOSIS — I10 Essential (primary) hypertension: Secondary | ICD-10-CM | POA: Diagnosis not present

## 2020-02-19 DIAGNOSIS — E119 Type 2 diabetes mellitus without complications: Secondary | ICD-10-CM | POA: Diagnosis not present

## 2020-02-19 DIAGNOSIS — M25561 Pain in right knee: Secondary | ICD-10-CM | POA: Diagnosis not present

## 2020-02-19 DIAGNOSIS — G894 Chronic pain syndrome: Secondary | ICD-10-CM | POA: Diagnosis not present

## 2020-02-19 DIAGNOSIS — M5442 Lumbago with sciatica, left side: Secondary | ICD-10-CM | POA: Diagnosis not present

## 2020-02-19 DIAGNOSIS — M170 Bilateral primary osteoarthritis of knee: Secondary | ICD-10-CM | POA: Diagnosis not present

## 2020-02-19 DIAGNOSIS — M549 Dorsalgia, unspecified: Secondary | ICD-10-CM | POA: Diagnosis not present

## 2020-02-19 DIAGNOSIS — G8929 Other chronic pain: Secondary | ICD-10-CM | POA: Diagnosis not present

## 2020-02-19 DIAGNOSIS — M25562 Pain in left knee: Secondary | ICD-10-CM | POA: Diagnosis not present

## 2020-02-19 DIAGNOSIS — M5441 Lumbago with sciatica, right side: Secondary | ICD-10-CM | POA: Diagnosis not present

## 2020-02-24 DIAGNOSIS — J45909 Unspecified asthma, uncomplicated: Secondary | ICD-10-CM | POA: Diagnosis not present

## 2020-02-24 DIAGNOSIS — J42 Unspecified chronic bronchitis: Secondary | ICD-10-CM | POA: Diagnosis not present

## 2020-02-26 DIAGNOSIS — G894 Chronic pain syndrome: Secondary | ICD-10-CM | POA: Diagnosis not present

## 2020-02-26 DIAGNOSIS — Z79891 Long term (current) use of opiate analgesic: Secondary | ICD-10-CM | POA: Diagnosis not present

## 2020-02-26 DIAGNOSIS — J45909 Unspecified asthma, uncomplicated: Secondary | ICD-10-CM | POA: Diagnosis not present

## 2020-02-26 DIAGNOSIS — R5382 Chronic fatigue, unspecified: Secondary | ICD-10-CM | POA: Diagnosis not present

## 2020-02-26 DIAGNOSIS — G8929 Other chronic pain: Secondary | ICD-10-CM | POA: Diagnosis not present

## 2020-02-26 DIAGNOSIS — M792 Neuralgia and neuritis, unspecified: Secondary | ICD-10-CM | POA: Diagnosis not present

## 2020-02-26 DIAGNOSIS — I119 Hypertensive heart disease without heart failure: Secondary | ICD-10-CM | POA: Diagnosis not present

## 2020-02-26 DIAGNOSIS — M5136 Other intervertebral disc degeneration, lumbar region: Secondary | ICD-10-CM | POA: Diagnosis not present

## 2020-02-26 DIAGNOSIS — M79609 Pain in unspecified limb: Secondary | ICD-10-CM | POA: Diagnosis not present

## 2020-02-26 DIAGNOSIS — M47897 Other spondylosis, lumbosacral region: Secondary | ICD-10-CM | POA: Diagnosis not present

## 2020-02-27 DIAGNOSIS — M47816 Spondylosis without myelopathy or radiculopathy, lumbar region: Secondary | ICD-10-CM | POA: Diagnosis not present

## 2020-02-27 DIAGNOSIS — M25561 Pain in right knee: Secondary | ICD-10-CM | POA: Diagnosis not present

## 2020-02-27 DIAGNOSIS — G8929 Other chronic pain: Secondary | ICD-10-CM | POA: Diagnosis not present

## 2020-02-27 DIAGNOSIS — G894 Chronic pain syndrome: Secondary | ICD-10-CM | POA: Diagnosis not present

## 2020-02-27 DIAGNOSIS — M7918 Myalgia, other site: Secondary | ICD-10-CM | POA: Diagnosis not present

## 2020-03-15 DIAGNOSIS — M47813 Spondylosis without myelopathy or radiculopathy, cervicothoracic region: Secondary | ICD-10-CM | POA: Diagnosis not present

## 2020-03-15 DIAGNOSIS — M47816 Spondylosis without myelopathy or radiculopathy, lumbar region: Secondary | ICD-10-CM | POA: Diagnosis not present

## 2020-03-25 DIAGNOSIS — M5136 Other intervertebral disc degeneration, lumbar region: Secondary | ICD-10-CM | POA: Diagnosis not present

## 2020-03-25 DIAGNOSIS — J42 Unspecified chronic bronchitis: Secondary | ICD-10-CM | POA: Diagnosis not present

## 2020-03-25 DIAGNOSIS — Z79891 Long term (current) use of opiate analgesic: Secondary | ICD-10-CM | POA: Diagnosis not present

## 2020-03-25 DIAGNOSIS — G894 Chronic pain syndrome: Secondary | ICD-10-CM | POA: Diagnosis not present

## 2020-03-25 DIAGNOSIS — J45909 Unspecified asthma, uncomplicated: Secondary | ICD-10-CM | POA: Diagnosis not present

## 2020-03-25 DIAGNOSIS — M170 Bilateral primary osteoarthritis of knee: Secondary | ICD-10-CM | POA: Diagnosis not present

## 2020-03-28 DIAGNOSIS — G894 Chronic pain syndrome: Secondary | ICD-10-CM | POA: Diagnosis not present

## 2020-04-09 ENCOUNTER — Other Ambulatory Visit: Payer: Self-pay | Admitting: Internal Medicine

## 2020-04-09 NOTE — Progress Notes (Deleted)
Virtual Visit via Video Note  I connected with Lindsay Hardy on 04/09/20 at 11:00 AM EDT by a video enabled telemedicine application and verified that I am speaking with the correct person using two identifiers.   I discussed the limitations of evaluation and management by telemedicine and the availability of in person appointments. The patient expressed understanding and agreed to proceed.  Present for the visit:  Myself, Dr Cheryll Cockayne, [name].  The patient is currently at home and I am in the office.    No referring provider.    History of Present Illness: She is here for an acute visit for cold symptoms.   Her symptoms started   She is experiencing   She has tried taking    ROS    Social History   Socioeconomic History  . Marital status: Single    Spouse name: Not on file  . Number of children: 1  . Years of education: 82  . Highest education level: Not on file  Occupational History  . Occupation:  former Agricultural engineer  Tobacco Use  . Smoking status: Former Smoker    Packs/day: 0.25    Years: 4.00    Pack years: 1.00    Types: Cigarettes  . Smokeless tobacco: Never Used  . Tobacco comment: smoked in 20's   Vaping Use  . Vaping Use: Never used  Substance and Sexual Activity  . Alcohol use: No    Alcohol/week: 0.0 standard drinks  . Drug use: No  . Sexual activity: Not Currently    Birth control/protection: Post-menopausal  Other Topics Concern  . Not on file  Social History Narrative   Born and raised in [place], [place]. Currently lives in a private residence with her nephew and his wife. Fun: Church activities, spending time with family   Denies any religious beliefs that would effect health care.    Social Determinants of Health   Financial Resource Strain:   . Difficulty of Paying Living Expenses: Not on file  Food Insecurity:   . Worried About Programme researcher, broadcasting/film/video in the Last Year: Not on file  . Ran Out of Food in the Last Year: Not on  file  Transportation Needs:   . Lack of Transportation (Medical): Not on file  . Lack of Transportation (Non-Medical): Not on file  Physical Activity:   . Days of Exercise per Week: Not on file  . Minutes of Exercise per Session: Not on file  Stress:   . Feeling of Stress : Not on file  Social Connections:   . Frequency of Communication with Friends and Family: Not on file  . Frequency of Social Gatherings with Friends and Family: Not on file  . Attends Religious Services: Not on file  . Active Member of Clubs or Organizations: Not on file  . Attends Banker Meetings: Not on file  . Marital Status: Not on file     Observations/Objective: Appears well in NAD   Assessment and Plan:  See Problem List for Assessment and Plan of chronic medical problems.   Follow Up Instructions:    I discussed the assessment and treatment plan with the patient. The patient was provided an opportunity to ask questions and all were answered. The patient agreed with the plan and demonstrated an understanding of the instructions.   The patient was advised to call back or seek an in-person evaluation if the symptoms worsen or if the condition fails to improve as anticipated.  Binnie Rail, MD

## 2020-04-10 ENCOUNTER — Telehealth: Payer: Medicare Other | Admitting: Internal Medicine

## 2020-04-10 ENCOUNTER — Telehealth: Payer: Self-pay

## 2020-04-10 MED ORDER — IBUPROFEN 800 MG PO TABS: 800.0000 mg | ORAL_TABLET | Freq: Two times a day (BID) | ORAL | 1 refills | Status: DC | PRN

## 2020-04-10 NOTE — Telephone Encounter (Signed)
ok 

## 2020-04-22 DIAGNOSIS — M170 Bilateral primary osteoarthritis of knee: Secondary | ICD-10-CM | POA: Diagnosis not present

## 2020-04-22 DIAGNOSIS — G894 Chronic pain syndrome: Secondary | ICD-10-CM | POA: Diagnosis not present

## 2020-04-22 DIAGNOSIS — Z79891 Long term (current) use of opiate analgesic: Secondary | ICD-10-CM | POA: Diagnosis not present

## 2020-04-22 DIAGNOSIS — M5136 Other intervertebral disc degeneration, lumbar region: Secondary | ICD-10-CM | POA: Diagnosis not present

## 2020-04-25 DIAGNOSIS — J45909 Unspecified asthma, uncomplicated: Secondary | ICD-10-CM | POA: Diagnosis not present

## 2020-04-25 DIAGNOSIS — J42 Unspecified chronic bronchitis: Secondary | ICD-10-CM | POA: Diagnosis not present

## 2020-04-27 DIAGNOSIS — G894 Chronic pain syndrome: Secondary | ICD-10-CM | POA: Diagnosis not present

## 2020-05-07 DIAGNOSIS — J45909 Unspecified asthma, uncomplicated: Secondary | ICD-10-CM | POA: Diagnosis not present

## 2020-05-07 DIAGNOSIS — J42 Unspecified chronic bronchitis: Secondary | ICD-10-CM | POA: Diagnosis not present

## 2020-05-20 DIAGNOSIS — M170 Bilateral primary osteoarthritis of knee: Secondary | ICD-10-CM | POA: Diagnosis not present

## 2020-05-20 DIAGNOSIS — M5136 Other intervertebral disc degeneration, lumbar region: Secondary | ICD-10-CM | POA: Diagnosis not present

## 2020-05-20 DIAGNOSIS — Z79891 Long term (current) use of opiate analgesic: Secondary | ICD-10-CM | POA: Diagnosis not present

## 2020-05-20 DIAGNOSIS — G894 Chronic pain syndrome: Secondary | ICD-10-CM | POA: Diagnosis not present

## 2020-05-25 DIAGNOSIS — J45909 Unspecified asthma, uncomplicated: Secondary | ICD-10-CM | POA: Diagnosis not present

## 2020-05-25 DIAGNOSIS — J42 Unspecified chronic bronchitis: Secondary | ICD-10-CM | POA: Diagnosis not present

## 2020-05-27 DIAGNOSIS — G894 Chronic pain syndrome: Secondary | ICD-10-CM | POA: Diagnosis not present

## 2020-06-17 ENCOUNTER — Other Ambulatory Visit: Payer: Self-pay | Admitting: Internal Medicine

## 2020-06-17 DIAGNOSIS — M5136 Other intervertebral disc degeneration, lumbar region: Secondary | ICD-10-CM | POA: Diagnosis not present

## 2020-06-17 DIAGNOSIS — G894 Chronic pain syndrome: Secondary | ICD-10-CM | POA: Diagnosis not present

## 2020-06-17 DIAGNOSIS — M170 Bilateral primary osteoarthritis of knee: Secondary | ICD-10-CM | POA: Diagnosis not present

## 2020-06-17 DIAGNOSIS — Z79891 Long term (current) use of opiate analgesic: Secondary | ICD-10-CM | POA: Diagnosis not present

## 2020-06-25 DIAGNOSIS — J42 Unspecified chronic bronchitis: Secondary | ICD-10-CM | POA: Diagnosis not present

## 2020-06-25 DIAGNOSIS — J45909 Unspecified asthma, uncomplicated: Secondary | ICD-10-CM | POA: Diagnosis not present

## 2020-06-26 DIAGNOSIS — G894 Chronic pain syndrome: Secondary | ICD-10-CM | POA: Diagnosis not present

## 2020-07-08 ENCOUNTER — Other Ambulatory Visit: Payer: Self-pay | Admitting: Internal Medicine

## 2020-07-09 ENCOUNTER — Telehealth: Payer: Self-pay | Admitting: Internal Medicine

## 2020-07-09 NOTE — Telephone Encounter (Signed)
ibuprofen (ADVIL) 800 MG tablet [id number] [place], [place] - [address] AT [place] Phone:  [phone]  Fax:  [phone]     Last seen- 08.25.21 Next apt- n/a Requesting a refill

## 2020-07-10 MED ORDER — IBUPROFEN 800 MG PO TABS: 800.0000 mg | ORAL_TABLET | Freq: Two times a day (BID) | ORAL | 0 refills | Status: DC | PRN

## 2020-07-10 NOTE — Telephone Encounter (Signed)
Sent in 10 days worth

## 2020-07-10 NOTE — Telephone Encounter (Signed)
She needs blood work - we need to monitor her kidney function is she is taking ibuprofen regularly.  Her sugars are also not controlled and need to be rechecked.

## 2020-07-10 NOTE — Telephone Encounter (Signed)
Patient made an appointment for 02.08.22 and wanted me to ask if she can go ahead and get the ibuprofen filled until she can come in

## 2020-07-10 NOTE — Addendum Note (Signed)
Addended by: Pincus Sanes on: 07/10/2020 01:02 PM   Modules accepted: Orders

## 2020-07-10 NOTE — Telephone Encounter (Signed)
Left message for patient that she needs to schedule in office visit. We need to do labs as we have not seen her in office since 11/01/19. With her taking the Ibuprofen we also need to monitor her kidney function and that is only obtainable through lab appointment with her coming in the office.

## 2020-07-10 NOTE — Telephone Encounter (Signed)
Spoke with patient today and she has been made aware she must keep appointment for next week or we will not be able to refill.

## 2020-07-14 NOTE — Patient Instructions (Signed)
Blood work was ordered.     No immunization administered today.   Medications changes include :     Your prescription(s) have been submitted to your pharmacy.    A referral was ordered for      Someone will call you to schedule an appointment.    Please followup in XX months    Health Maintenance, Female Adopting a healthy lifestyle and getting preventive care are important in promoting health and wellness. Ask your health care provider about:  The right schedule for you to have regular tests and exams.  Things you can do on your own to prevent diseases and keep yourself healthy. What should I know about diet, weight, and exercise? Eat a healthy diet  Eat a diet that includes plenty of vegetables, fruits, low-fat dairy products, and lean protein.  Do not eat a lot of foods that are high in solid fats, added sugars, or sodium.   Maintain a healthy weight Body mass index (BMI) is used to identify weight problems. It estimates body fat based on height and weight. Your health care provider can help determine your BMI and help you achieve or maintain a healthy weight. Get regular exercise Get regular exercise. This is one of the most important things you can do for your health. Most adults should:  Exercise for at least 150 minutes each week. The exercise should increase your heart rate and make you sweat (moderate-intensity exercise).  Do strengthening exercises at least twice a week. This is in addition to the moderate-intensity exercise.  Spend less time sitting. Even light physical activity can be beneficial. Watch cholesterol and blood lipids Have your blood tested for lipids and cholesterol at 72 years of age, then have this test every 5 years. Have your cholesterol levels checked more often if:  Your lipid or cholesterol levels are high.  You are older than 72 years of age.  You are at high risk for heart disease. What should I know about cancer  screening? Depending on your health history and family history, you may need to have cancer screening at various ages. This may include screening for:  Breast cancer.  Cervical cancer.  Colorectal cancer.  Skin cancer.  Lung cancer. What should I know about heart disease, diabetes, and high blood pressure? Blood pressure and heart disease  High blood pressure causes heart disease and increases the risk of stroke. This is more likely to develop in people who have high blood pressure readings, are of African descent, or are overweight.  Have your blood pressure checked: ? Every 3-5 years if you are 72-97 years of age. ? Every year if you are 72 years old or older. Diabetes Have regular diabetes screenings. This checks your fasting blood sugar level. Have the screening done:  Once every three years after age 72 if you are at a normal weight and have a low risk for diabetes.  More often and at a younger age if you are overweight or have a high risk for diabetes. What should I know about preventing infection? Hepatitis B If you have a higher risk for hepatitis B, you should be screened for this virus. Talk with your health care provider to find out if you are at risk for hepatitis B infection. Hepatitis C Testing is recommended for:  Everyone born from [birth year] through 1967.  Anyone with known risk factors for hepatitis C. Sexually transmitted infections (STIs)  Get screened for STIs, including gonorrhea and chlamydia, if: ? You are  sexually active and are younger than 72 years of age. ? You are older than 72 years of age and your health care provider tells you that you are at risk for this type of infection. ? Your sexual activity has changed since you were last screened, and you are at increased risk for chlamydia or gonorrhea. Ask your health care provider if you are at risk.  Ask your health care provider about whether you are at high risk for HIV. Your health care provider may  recommend a prescription medicine to help prevent HIV infection. If you choose to take medicine to prevent HIV, you should first get tested for HIV. You should then be tested every 3 months for as long as you are taking the medicine. Pregnancy  If you are about to stop having your period (premenopausal) and you may become pregnant, seek counseling before you get pregnant.  Take 400 to 800 micrograms (mcg) of folic acid every day if you become pregnant.  Ask for birth control (contraception) if you want to prevent pregnancy. Osteoporosis and menopause Osteoporosis is a disease in which the bones lose minerals and strength with aging. This can result in bone fractures. If you are 72 years old or older, or if you are at risk for osteoporosis and fractures, ask your health care provider if you should:  Be screened for bone loss.  Take a calcium or vitamin D supplement to lower your risk of fractures.  Be given hormone replacement therapy (HRT) to treat symptoms of menopause. Follow these instructions at home: Lifestyle  Do not use any products that contain nicotine or tobacco, such as cigarettes, e-cigarettes, and chewing tobacco. If you need help quitting, ask your health care provider.  Do not use street drugs.  Do not share needles.  Ask your health care provider for help if you need support or information about quitting drugs. Alcohol use  Do not drink alcohol if: ? Your health care provider tells you not to drink. ? You are pregnant, may be pregnant, or are planning to become pregnant.  If you drink alcohol: ? Limit how much you use to 0-1 drink a day. ? Limit intake if you are breastfeeding.  Be aware of how much alcohol is in your drink. In the [place] one drink equals one 12 oz bottle of beer (355 mL), one 5 oz glass of wine (148 mL), or one 1 oz glass of hard liquor (44 mL). General instructions  Schedule regular health, dental, and eye exams.  Stay current with your  vaccines.  Tell your health care provider if: ? You often feel depressed. ? You have ever been abused or do not feel safe at home. Summary  Adopting a healthy lifestyle and getting preventive care are important in promoting health and wellness.  Follow your health care provider's instructions about healthy diet, exercising, and getting tested or screened for diseases.  Follow your health care provider's instructions on monitoring your cholesterol and blood pressure. This information is not intended to replace advice given to you by your health care provider. Make sure you discuss any questions you have with your health care provider. Document Revised: 05/18/2018 Document Reviewed: 05/18/2018 Elsevier Patient Education  2021 [employer].

## 2020-07-14 NOTE — Progress Notes (Signed)
Subjective:    Patient ID: Lindsay Hardy, female    DOB: [birth year], 70 y.o.   MRN: [id number]   This visit occurred during the SARS-CoV-2 public health emergency.  Safety protocols were in place, including screening questions prior to the visit, additional usage of staff PPE, and extensive cleaning of exam room while observing appropriate contact time as indicated for disinfecting solutions.    HPI She is here for a physical exam.     Medications and allergies reviewed with patient and updated if appropriate.  Patient Active Problem List   Diagnosis Date Noted  . Allergic rhinitis 11/22/2018  . External hemorrhoids 10/07/2018  . Candidiasis 10/07/2018  . Chronic constipation 10/07/2018  . Morbid obesity ([place]) 04/01/2017  . PAD (peripheral artery disease) ([place]) 10/29/2016  . Chronic back pain 09/23/2016  . Diabetic peripheral neuropathy ([place]) 02/05/2016  . Uncontrolled diabetes mellitus ([place]) 06/17/2006  . Hyperlipidemia 06/17/2006  . Essential hypertension 06/17/2006  . Coronary atherosclerosis 06/17/2006  . DIASTOLIC DYSFUNCTION 16/12/3708  . DISEASE, CEREBROVASCULAR NEC 06/17/2006  . Osteoarthritis of both knees 06/17/2006    Current Outpatient Medications on File Prior to Visit  Medication Sig Dispense Refill  . Accu-Chek FastClix Lancets MISC USE TO CHECK BLOOD SUGARS 3 TIMES DAILY. E11.65 306 each 2  . ACCU-CHEK GUIDE test strip USE TO CHECK BLOOD SUGARS TWICE DAILY. E11.43 100 strip 5  . albuterol (VENTOLIN HFA) 108 (90 Base) MCG/ACT inhaler INHALE 2 PUFFS INTO THE LUNGS EVERY 4 (FOUR) HOURS AS NEEDED FOR SHORTNESS OF BREATH. 6.7 g 11  . amLODipine (NORVASC) 10 MG tablet Take 1 tablet (10 mg total) by mouth daily. 90 tablet 1  . aspirin 81 MG EC tablet TAKE 1 TABLET (81 MG TOTAL) BY MOUTH DAILY. 90 tablet 1  . Blood Glucose Monitoring Suppl (ONETOUCH VERIO IQ SYSTEM) w/Device KIT Use to check blood sugar once to twice daily. 1 kit 0  . diclofenac Sodium  (VOLTAREN) 1 % GEL Apply 4 g topically 4 (four) times daily. 350 g 5  . fluticasone (FLONASE) 50 MCG/ACT nasal spray Place 2 sprays into both nostrils daily. 16 g 6  . gabapentin (NEURONTIN) 300 MG capsule Take 1 capsule (300 mg total) by mouth 2 (two) times daily. 180 capsule 1  . glipiZIDE (GLUCOTROL) 10 MG tablet Take 1 tablet (10 mg total) by mouth 2 (two) times daily before a meal. 180 tablet 1  . hydrochlorothiazide (HYDRODIURIL) 25 MG tablet Take 1 tablet (25 mg total) by mouth daily. 90 tablet 1  . hydrocortisone (ANUSOL-HC) 2.5 % rectal cream Place 1 application rectally 2 (two) times daily. 30 g 5  . ibuprofen (ADVIL) 800 MG tablet Take 1 tablet (800 mg total) by mouth 2 (two) times daily as needed for moderate pain. 20 tablet 0  . linaclotide (LINZESS) 145 MCG CAPS capsule TAKE 1 CAPSULE (145 MCG TOTAL) BY MOUTH EVERY MORNING. 90 capsule 1  . metFORMIN (GLUCOPHAGE-XR) 500 MG 24 hr tablet Take 2 tablets by mouth 2 times daily before a meal. 360 tablet 1  . Misc. Devices (BARIATRIC ROLLATOR) MISC Use as directed.  Dx  M54.9 1 each 0  . montelukast (SINGULAIR) 10 MG tablet TAKE 1 TABLET BY MOUTH EVERYDAY AT BEDTIME 90 tablet 1  . nystatin cream (MYCOSTATIN) Apply 1 application topically 2 (two) times daily. For stomach or under breasts 30 g 5  . potassium chloride (MICRO-K) 10 MEQ CR capsule Take 1 capsule by mouth twice a day. 180 capsule 1  No current facility-administered medications on file prior to visit.    Past Medical History:  Diagnosis Date  . Asthma   . CAD (coronary artery disease) 11/2003    RCA 60% stenosis, LAD diffuse  . Chicken pox   . CVA (cerebrovascular accident) [facility]) 2001    right frontoparietal cortical CVA - MRI September 2001  . Diabetes mellitus   . Diastolic dysfunction     ejection fraction 55-65% on 2-D echo May 2006  . Empty sella syndrome [facility])     based on MRI September 2001, related to obesity, hypertension, it was determined that patient has not  required treatment but will be monitored with TSH, ACTH, cortisol, testosterone, prolactin, growth hormone  . GERD (gastroesophageal reflux disease)     EGD June 2004 -  followed by Dr. Carlean Purl  . Granulomatous disease, chronic (HCC)     in right upper lobe per x-ray December 2005  . Hyperlipidemia   . Hypertension   . NAFLD (nonalcoholic fatty liver disease)     based on ultrasound June 2002, elevated transaminase  . OA (osteoarthritis)     DJP coracoclavicular ligament, left patella osteophytes June 2002, L4-S1 spondylosis, degenerative disc disease with bulge. I in October 2001, the C2-C4 spondylosis without stenosis and with spurs based on x-ray July 2005; diffuse idiopathic skeletal hyperostosis with bilateral hip lumbar degenerative changes   on x-ray February 2005, bilateral plantar calcaneal spurs  . Reactive airway disease     peak flow 180 in May 2005, chronic sinusitis with PND, bronchitis December 2002 and strep pneumonia April 2007,  FEC 66, MCV 60, ratio is 69, DLCO 57,  moderate restrictive and mild obstructive disease  on spirometry May 2005    Past Surgical History:  Procedure Laterality Date  . CARPAL TUNNEL RELEASE  1992    left arm 1992  . CHOLECYSTECTOMY  2003  . TONSILLECTOMY AND ADENOIDECTOMY      Social History   Socioeconomic History  . Marital status: Single    Spouse name: Not on file  . Number of children: 1  . Years of education: 36  . Highest education level: Not on file  Occupational History  . Occupation:  former Psychologist, counselling  Tobacco Use  . Smoking status: Former Smoker    Packs/day: 0.25    Years: 4.00    Pack years: 1.00    Types: Cigarettes  . Smokeless tobacco: Never Used  . Tobacco comment: smoked in 20's   Vaping Use  . Vaping Use: Never used  Substance and Sexual Activity  . Alcohol use: No    Alcohol/week: 0.0 standard drinks  . Drug use: No  . Sexual activity: Not Currently    Birth control/protection: Post-menopausal   Other Topics Concern  . Not on file  Social History Narrative   Born and raised in [place], [place]. Currently lives in a private residence with her nephew and his wife. Fun: Church activities, spending time with family   Denies any religious beliefs that would effect health care.    Social Determinants of Health   Financial Resource Strain: Not on file  Food Insecurity: Not on file  Transportation Needs: Not on file  Physical Activity: Not on file  Stress: Not on file  Social Connections: Not on file    Family History  Problem Relation Age of Onset  . Lung cancer Mother   . Hypertension Mother   . Lung cancer Sister   . Hypertension Sister  Review of Systems     Objective:  There were no vitals filed for this visit. There were no vitals filed for this visit. There is no height or weight on file to calculate BMI.  BP Readings from Last 3 Encounters:  12/29/19 (!) 130/70  11/24/19 (!) 167/123  11/01/19 (!) 146/90    Wt Readings from Last 3 Encounters:  12/29/19 (!) 270 lb (122.5 kg)  11/23/19 268 lb (121.6 kg)  11/01/19 268 lb (121.6 kg)     Physical Exam Constitutional: She appears well-developed and well-nourished. No distress.  HENT:  Head: Normocephalic and atraumatic.  Right Ear: External ear normal. Normal ear canal and TM Left Ear: External ear normal.  Normal ear canal and TM Mouth/Throat: Oropharynx is clear and moist.  Eyes: Conjunctivae and EOM are normal.  Neck: Neck supple. No tracheal deviation present. No thyromegaly present.  No carotid bruit  Cardiovascular: Normal rate, regular rhythm and normal heart sounds.   No murmur heard.  No edema. Pulmonary/Chest: Effort normal and breath sounds normal. No respiratory distress. She has no wheezes. She has no rales.  Breast: deferred   Abdominal: Soft. She exhibits no distension. There is no tenderness.  Lymphadenopathy: She has no cervical adenopathy.  Skin: Skin is warm and dry. She is not  diaphoretic.  Psychiatric: She has a normal mood and affect. Her behavior is normal.        Assessment & Plan:   Physical exam: Screening blood work    ordered Immunizations   Colonoscopy   Mammogram   Gyn   Dexa   Eye exams   Exercise   Weight  Stressed weight loss Substance abuse  none      See Problem List for Assessment and Plan of chronic medical problems.      This encounter was created in error - please disregard.

## 2020-07-15 DIAGNOSIS — M48062 Spinal stenosis, lumbar region with neurogenic claudication: Secondary | ICD-10-CM | POA: Diagnosis not present

## 2020-07-15 DIAGNOSIS — G8929 Other chronic pain: Secondary | ICD-10-CM | POA: Diagnosis not present

## 2020-07-15 DIAGNOSIS — M4807 Spinal stenosis, lumbosacral region: Secondary | ICD-10-CM | POA: Diagnosis not present

## 2020-07-15 DIAGNOSIS — G894 Chronic pain syndrome: Secondary | ICD-10-CM | POA: Diagnosis not present

## 2020-07-15 DIAGNOSIS — I119 Hypertensive heart disease without heart failure: Secondary | ICD-10-CM | POA: Diagnosis not present

## 2020-07-15 DIAGNOSIS — R5382 Chronic fatigue, unspecified: Secondary | ICD-10-CM | POA: Diagnosis not present

## 2020-07-15 DIAGNOSIS — M4726 Other spondylosis with radiculopathy, lumbar region: Secondary | ICD-10-CM | POA: Diagnosis not present

## 2020-07-15 DIAGNOSIS — M47897 Other spondylosis, lumbosacral region: Secondary | ICD-10-CM | POA: Diagnosis not present

## 2020-07-15 DIAGNOSIS — M792 Neuralgia and neuritis, unspecified: Secondary | ICD-10-CM | POA: Diagnosis not present

## 2020-07-15 DIAGNOSIS — M79609 Pain in unspecified limb: Secondary | ICD-10-CM | POA: Diagnosis not present

## 2020-07-16 ENCOUNTER — Encounter: Payer: Medicare Other | Admitting: Internal Medicine

## 2020-07-16 DIAGNOSIS — Z0000 Encounter for general adult medical examination without abnormal findings: Secondary | ICD-10-CM

## 2020-07-16 DIAGNOSIS — E1165 Type 2 diabetes mellitus with hyperglycemia: Secondary | ICD-10-CM

## 2020-07-16 DIAGNOSIS — E782 Mixed hyperlipidemia: Secondary | ICD-10-CM

## 2020-07-16 DIAGNOSIS — M170 Bilateral primary osteoarthritis of knee: Secondary | ICD-10-CM

## 2020-07-16 DIAGNOSIS — E1142 Type 2 diabetes mellitus with diabetic polyneuropathy: Secondary | ICD-10-CM

## 2020-07-16 DIAGNOSIS — I10 Essential (primary) hypertension: Secondary | ICD-10-CM

## 2020-07-16 DIAGNOSIS — G8929 Other chronic pain: Secondary | ICD-10-CM

## 2020-07-26 DIAGNOSIS — J45909 Unspecified asthma, uncomplicated: Secondary | ICD-10-CM | POA: Diagnosis not present

## 2020-07-26 DIAGNOSIS — J42 Unspecified chronic bronchitis: Secondary | ICD-10-CM | POA: Diagnosis not present

## 2020-07-26 DIAGNOSIS — G894 Chronic pain syndrome: Secondary | ICD-10-CM | POA: Diagnosis not present

## 2020-08-02 ENCOUNTER — Other Ambulatory Visit: Payer: Self-pay | Admitting: Internal Medicine

## 2020-08-05 NOTE — Progress Notes (Signed)
Subjective:    Patient ID: Lindsay Hardy, female    DOB: 06/01/1951, 70 y.o.   MRN: [id number]   This visit occurred during the SARS-CoV-2 public health emergency.  Safety protocols were in place, including screening questions prior to the visit, additional usage of staff PPE, and extensive cleaning of exam room while observing appropriate contact time as indicated for disinfecting solutions.    HPI She is here for a physical exam.   She still has chronic back pain.  She is seeing pain management. Her pain is getting worse. She take the pain medication.   She knows she needs to lose weight.  She wants to lose weight.    She is only taking 1/2 of her metformin dose.  She does not monitor her sugars.  She is not exercising.    Medications and allergies reviewed with patient and updated if appropriate.  Patient Active Problem List   Diagnosis Date Noted  . Allergic rhinitis 11/22/2018  . External hemorrhoids 10/07/2018  . Chronic constipation 10/07/2018  . Morbid obesity ([place]) 04/01/2017  . PAD (peripheral artery disease) ([place]) 10/29/2016  . Chronic back pain 09/23/2016  . Diabetic peripheral neuropathy ([place]) 02/05/2016  . Uncontrolled diabetes mellitus ([place]) 06/17/2006  . Hyperlipidemia 06/17/2006  . Essential hypertension 06/17/2006  . Coronary atherosclerosis 06/17/2006  . DIASTOLIC DYSFUNCTION 85/07/7739  . DISEASE, CEREBROVASCULAR NEC 06/17/2006  . Osteoarthritis of both knees 06/17/2006    Current Outpatient Medications on File Prior to Visit  Medication Sig Dispense Refill  . Accu-Chek FastClix Lancets MISC USE TO CHECK BLOOD SUGARS 3 TIMES DAILY. E11.65 306 each 2  . ACCU-CHEK GUIDE test strip USE TO CHECK BLOOD SUGARS TWICE DAILY. E11.43 100 strip 5  . albuterol (VENTOLIN HFA) 108 (90 Base) MCG/ACT inhaler INHALE 2 PUFFS INTO THE LUNGS EVERY 4 (FOUR) HOURS AS NEEDED FOR SHORTNESS OF BREATH. 6.7 g 11  . amLODipine (NORVASC) 10 MG tablet Take 1 tablet (10 mg  total) by mouth daily. 90 tablet 1  . aspirin 81 MG EC tablet TAKE 1 TABLET (81 MG TOTAL) BY MOUTH DAILY. 90 tablet 1  . Blood Glucose Monitoring Suppl (ONETOUCH VERIO IQ SYSTEM) w/Device KIT Use to check blood sugar once to twice daily. 1 kit 0  . diclofenac Sodium (VOLTAREN) 1 % GEL Apply 4 g topically 4 (four) times daily. 350 g 5  . fluticasone (FLONASE) 50 MCG/ACT nasal spray Place 2 sprays into both nostrils daily. 16 g 6  . gabapentin (NEURONTIN) 100 MG capsule SMARTSIG:1-2 Capsule(s) By Mouth 2-3 Times Daily PRN    . glipiZIDE (GLUCOTROL) 10 MG tablet Take 1 tablet (10 mg total) by mouth 2 (two) times daily before a meal. 180 tablet 1  . hydrochlorothiazide (HYDRODIURIL) 25 MG tablet Take 1 tablet (25 mg total) by mouth daily. 90 tablet 1  . HYDROcodone-acetaminophen (NORCO) 10-325 MG tablet SMARTSIG:1 Tablet(s) By Mouth 4-6 Times Daily    . hydrocortisone (ANUSOL-HC) 2.5 % rectal cream Place 1 application rectally 2 (two) times daily. 30 g 5  . ibuprofen (ADVIL) 800 MG tablet Take 1 tablet (800 mg total) by mouth 2 (two) times daily as needed for moderate pain. 20 tablet 0  . linaclotide (LINZESS) 145 MCG CAPS capsule TAKE 1 CAPSULE (145 MCG TOTAL) BY MOUTH EVERY MORNING. 90 capsule 1  . metFORMIN (GLUCOPHAGE-XR) 500 MG 24 hr tablet Take 2 tablets by mouth 2 times daily before a meal. 360 tablet 1  . Misc. Devices (BARIATRIC ROLLATOR) MISC Use  as directed.  Dx  M54.9 1 each 0  . montelukast (SINGULAIR) 10 MG tablet TAKE 1 TABLET BY MOUTH EVERYDAY AT BEDTIME 90 tablet 1  . NARCAN 4 MG/0.1ML LIQD nasal spray kit 1 spray once.    . nystatin cream (MYCOSTATIN) Apply 1 application topically 2 (two) times daily. For stomach or under breasts 30 g 5  . potassium chloride (MICRO-K) 10 MEQ CR capsule TAKE 1 CAPSULE BY MOUTH TWICE A DAY 180 capsule 1  . RESTASIS 0.05 % ophthalmic emulsion      No current facility-administered medications on file prior to visit.    Past Medical History:   Diagnosis Date  . Asthma   . CAD (coronary artery disease) 11/2003    RCA 60% stenosis, LAD diffuse  . Chicken pox   . CVA (cerebrovascular accident) [facility]) 2001    right frontoparietal cortical CVA - MRI September 2001  . Diabetes mellitus   . Diastolic dysfunction     ejection fraction 55-65% on 2-D echo May 2006  . Empty sella syndrome [facility])     based on MRI September 2001, related to obesity, hypertension, it was determined that patient has not required treatment but will be monitored with TSH, ACTH, cortisol, testosterone, prolactin, growth hormone  . GERD (gastroesophageal reflux disease)     EGD June 2004 -  followed by Dr. Carlean Purl  . Granulomatous disease, chronic (HCC)     in right upper lobe per x-ray December 2005  . Hyperlipidemia   . Hypertension   . NAFLD (nonalcoholic fatty liver disease)     based on ultrasound June 2002, elevated transaminase  . OA (osteoarthritis)     DJP coracoclavicular ligament, left patella osteophytes June 2002, L4-S1 spondylosis, degenerative disc disease with bulge. I in October 2001, the C2-C4 spondylosis without stenosis and with spurs based on x-ray July 2005; diffuse idiopathic skeletal hyperostosis with bilateral hip lumbar degenerative changes   on x-ray February 2005, bilateral plantar calcaneal spurs  . Reactive airway disease     peak flow 180 in May 2005, chronic sinusitis with PND, bronchitis December 2002 and strep pneumonia April 2007,  FEC 66, MCV 60, ratio is 69, DLCO 57,  moderate restrictive and mild obstructive disease  on spirometry May 2005    Past Surgical History:  Procedure Laterality Date  . CARPAL TUNNEL RELEASE  1992    left arm 1992  . CHOLECYSTECTOMY  2003  . TONSILLECTOMY AND ADENOIDECTOMY      Social History   Socioeconomic History  . Marital status: Single    Spouse name: Not on file  . Number of children: 1  . Years of education: 23  . Highest education level: Not on file  Occupational History  .  Occupation:  former Psychologist, counselling  Tobacco Use  . Smoking status: Former Smoker    Packs/day: 0.25    Years: 4.00    Pack years: 1.00    Types: Cigarettes  . Smokeless tobacco: Never Used  . Tobacco comment: smoked in 20's   Vaping Use  . Vaping Use: Never used  Substance and Sexual Activity  . Alcohol use: No    Alcohol/week: 0.0 standard drinks  . Drug use: No  . Sexual activity: Not Currently    Birth control/protection: Post-menopausal  Other Topics Concern  . Not on file  Social History Narrative   Born and raised in [place], [place]. Currently lives in a private residence with her nephew and his wife. Fun: Church activities, spending  time with family   Denies any religious beliefs that would effect health care.    Social Determinants of Health   Financial Resource Strain: Not on file  Food Insecurity: Not on file  Transportation Needs: Not on file  Physical Activity: Not on file  Stress: Not on file  Social Connections: Not on file    Family History  Problem Relation Age of Onset  . Lung cancer Mother   . Hypertension Mother   . Lung cancer Sister   . Hypertension Sister     Review of Systems  Constitutional: Negative for chills and fever.  Eyes: Negative for visual disturbance.  Respiratory: Positive for shortness of breath (with exertion, sometimes) and wheezing (with laying down - resolved with coughing). Negative for cough.   Cardiovascular: Positive for leg swelling (LLE). Negative for chest pain and palpitations.  Gastrointestinal: Positive for abdominal pain (related to certain meds - mild) and constipation (linzess prn). Negative for blood in stool, diarrhea and nausea.       No gerd  Genitourinary: Negative for dysuria and hematuria.  Musculoskeletal: Positive for arthralgias (knees, L ankle) and back pain.  Skin: Negative for rash.  Neurological: Positive for numbness (occ in fingers - at night). Negative for dizziness, light-headedness and  headaches.  Psychiatric/Behavioral: Negative for dysphoric mood. The patient is not nervous/anxious.        Objective:   Vitals:   08/06/20 1429  BP: (!) 148/86  Pulse: 98  Temp: 98 F (36.7 C)  SpO2: 99%   Filed Weights   08/06/20 1429  Weight: 274 lb (124.3 kg)   Body mass index is 45.6 kg/m.  BP Readings from Last 3 Encounters:  08/06/20 (!) 148/86  12/29/19 (!) 130/70  11/24/19 (!) 167/123    Wt Readings from Last 3 Encounters:  08/06/20 274 lb (124.3 kg)  12/29/19 (!) 270 lb (122.5 kg)  11/23/19 268 lb (121.6 kg)     Physical Exam Constitutional: She appears well-developed and well-nourished. No distress.  HENT:  Head: Normocephalic and atraumatic.  Right Ear: External ear normal. Normal ear canal and TM Left Ear: External ear normal.  Normal ear canal and TM Mouth/Throat: Oropharynx is clear and moist.  Eyes: Conjunctivae and EOM are normal.  Neck: Neck supple. No tracheal deviation present. No thyromegaly present.  No carotid bruit  Cardiovascular: Normal rate, regular rhythm and normal heart sounds.   No murmur heard.  No RLE edema, 1+ pitting LLE edema Pulmonary/Chest: Effort normal and breath sounds normal. No respiratory distress. She has no wheezes. She has no rales.  Breast: deferred   Abdominal: Soft. Obese.  She exhibits no distension. There is no tenderness.  Lymphadenopathy: She has no cervical adenopathy.  Skin: Skin is warm and dry. She is not diaphoretic.  Psychiatric: She has a normal mood and affect. Her behavior is normal.        Assessment & Plan:   Physical exam: Screening blood work    ordered Immunizations  Had covid, deferred flu  Colonoscopy   - deferred Mammogram   - not up to date -- will schedule Dexa   - deferred Eye exams   Due - will schedule  Exercise  none Weight  Stressed weight loss - discussed at length Substance abuse  none      See Problem List for Assessment and Plan of chronic medical  problems.

## 2020-08-05 NOTE — Patient Instructions (Addendum)
Blood work was ordered.     Medications changes include :   Retry ozempic.  Increase metformin to 2 tabs twice a day with meals.  Try linzess 72 mcg if needed.    Your prescription(s) have been submitted to your pharmacy. Please take as directed and contact our office if you believe you are having problem(s) with the medication(s).   A referral was ordered for PT.     Someone from their office will call you to schedule an appointment.    Please followup in 3 months    Health Maintenance, Female Adopting a healthy lifestyle and getting preventive care are important in promoting health and wellness. Ask your health care provider about:  The right schedule for you to have regular tests and exams.  Things you can do on your own to prevent diseases and keep yourself healthy. What should I know about diet, weight, and exercise? Eat a healthy diet  Eat a diet that includes plenty of vegetables, fruits, low-fat dairy products, and lean protein.  Do not eat a lot of foods that are high in solid fats, added sugars, or sodium.   Maintain a healthy weight Body mass index (BMI) is used to identify weight problems. It estimates body fat based on height and weight. Your health care provider can help determine your BMI and help you achieve or maintain a healthy weight. Get regular exercise Get regular exercise. This is one of the most important things you can do for your health. Most adults should:  Exercise for at least 150 minutes each week. The exercise should increase your heart rate and make you sweat (moderate-intensity exercise).  Do strengthening exercises at least twice a week. This is in addition to the moderate-intensity exercise.  Spend less time sitting. Even light physical activity can be beneficial. Watch cholesterol and blood lipids Have your blood tested for lipids and cholesterol at 72 years of age, then have this test every 5 years. Have your cholesterol levels checked  more often if:  Your lipid or cholesterol levels are high.  You are older than 72 years of age.  You are at high risk for heart disease. What should I know about cancer screening? Depending on your health history and family history, you may need to have cancer screening at various ages. This may include screening for:  Breast cancer.  Cervical cancer.  Colorectal cancer.  Skin cancer.  Lung cancer. What should I know about heart disease, diabetes, and high blood pressure? Blood pressure and heart disease  High blood pressure causes heart disease and increases the risk of stroke. This is more likely to develop in people who have high blood pressure readings, are of African descent, or are overweight.  Have your blood pressure checked: ? Every 3-5 years if you are 24-46 years of age. ? Every year if you are 90 years old or older. Diabetes Have regular diabetes screenings. This checks your fasting blood sugar level. Have the screening done:  Once every three years after age 82 if you are at a normal weight and have a low risk for diabetes.  More often and at a younger age if you are overweight or have a high risk for diabetes. What should I know about preventing infection? Hepatitis B If you have a higher risk for hepatitis B, you should be screened for this virus. Talk with your health care provider to find out if you are at risk for hepatitis B infection. Hepatitis C Testing is  recommended for:  Everyone born from [birth year] through 1967.  Anyone with known risk factors for hepatitis C. Sexually transmitted infections (STIs)  Get screened for STIs, including gonorrhea and chlamydia, if: ? You are sexually active and are younger than 72 years of age. ? You are older than 72 years of age and your health care provider tells you that you are at risk for this type of infection. ? Your sexual activity has changed since you were last screened, and you are at increased risk for  chlamydia or gonorrhea. Ask your health care provider if you are at risk.  Ask your health care provider about whether you are at high risk for HIV. Your health care provider may recommend a prescription medicine to help prevent HIV infection. If you choose to take medicine to prevent HIV, you should first get tested for HIV. You should then be tested every 3 months for as long as you are taking the medicine. Pregnancy  If you are about to stop having your period (premenopausal) and you may become pregnant, seek counseling before you get pregnant.  Take 400 to 800 micrograms (mcg) of folic acid every day if you become pregnant.  Ask for birth control (contraception) if you want to prevent pregnancy. Osteoporosis and menopause Osteoporosis is a disease in which the bones lose minerals and strength with aging. This can result in bone fractures. If you are 58 years old or older, or if you are at risk for osteoporosis and fractures, ask your health care provider if you should:  Be screened for bone loss.  Take a calcium or vitamin D supplement to lower your risk of fractures.  Be given hormone replacement therapy (HRT) to treat symptoms of menopause. Follow these instructions at home: Lifestyle  Do not use any products that contain nicotine or tobacco, such as cigarettes, e-cigarettes, and chewing tobacco. If you need help quitting, ask your health care provider.  Do not use street drugs.  Do not share needles.  Ask your health care provider for help if you need support or information about quitting drugs. Alcohol use  Do not drink alcohol if: ? Your health care provider tells you not to drink. ? You are pregnant, may be pregnant, or are planning to become pregnant.  If you drink alcohol: ? Limit how much you use to 0-1 drink a day. ? Limit intake if you are breastfeeding.  Be aware of how much alcohol is in your drink. In the [place] one drink equals one 12 oz bottle of beer (355  mL), one 5 oz glass of wine (148 mL), or one 1 oz glass of hard liquor (44 mL). General instructions  Schedule regular health, dental, and eye exams.  Stay current with your vaccines.  Tell your health care provider if: ? You often feel depressed. ? You have ever been abused or do not feel safe at home. Summary  Adopting a healthy lifestyle and getting preventive care are important in promoting health and wellness.  Follow your health care provider's instructions about healthy diet, exercising, and getting tested or screened for diseases.  Follow your health care provider's instructions on monitoring your cholesterol and blood pressure. This information is not intended to replace advice given to you by your health care provider. Make sure you discuss any questions you have with your health care provider. Document Revised: 05/18/2018 Document Reviewed: 05/18/2018 Elsevier Patient Education  2021 [employer].

## 2020-08-06 ENCOUNTER — Ambulatory Visit (INDEPENDENT_AMBULATORY_CARE_PROVIDER_SITE_OTHER): Payer: Medicare Other | Admitting: Internal Medicine

## 2020-08-06 ENCOUNTER — Other Ambulatory Visit: Payer: Self-pay

## 2020-08-06 ENCOUNTER — Encounter: Payer: Self-pay | Admitting: Internal Medicine

## 2020-08-06 VITALS — BP 148/86 | HR 98 | Temp 98.0°F | Wt 274.0 lb

## 2020-08-06 DIAGNOSIS — E1142 Type 2 diabetes mellitus with diabetic polyneuropathy: Secondary | ICD-10-CM

## 2020-08-06 DIAGNOSIS — M5442 Lumbago with sciatica, left side: Secondary | ICD-10-CM

## 2020-08-06 DIAGNOSIS — M5441 Lumbago with sciatica, right side: Secondary | ICD-10-CM

## 2020-08-06 DIAGNOSIS — E782 Mixed hyperlipidemia: Secondary | ICD-10-CM

## 2020-08-06 DIAGNOSIS — E1165 Type 2 diabetes mellitus with hyperglycemia: Secondary | ICD-10-CM

## 2020-08-06 DIAGNOSIS — I10 Essential (primary) hypertension: Secondary | ICD-10-CM

## 2020-08-06 DIAGNOSIS — Z0000 Encounter for general adult medical examination without abnormal findings: Secondary | ICD-10-CM | POA: Diagnosis not present

## 2020-08-06 DIAGNOSIS — G8929 Other chronic pain: Secondary | ICD-10-CM

## 2020-08-06 LAB — CBC WITH DIFFERENTIAL/PLATELET
Basophils Absolute: 0.1 10*3/uL (ref 0.0–0.1)
Basophils Relative: 1.2 % (ref 0.0–3.0)
Eosinophils Absolute: 0.1 10*3/uL (ref 0.0–0.7)
Eosinophils Relative: 1.9 % (ref 0.0–5.0)
HCT: 41.8 % (ref 36.0–46.0)
Hemoglobin: 13.5 g/dL (ref 12.0–15.0)
Lymphocytes Relative: 32.3 % (ref 12.0–46.0)
Lymphs Abs: 1.7 10*3/uL (ref 0.7–4.0)
MCHC: 32.2 g/dL (ref 30.0–36.0)
MCV: 78.9 fl (ref 78.0–100.0)
Monocytes Absolute: 0.6 10*3/uL (ref 0.1–1.0)
Monocytes Relative: 11.1 % (ref 3.0–12.0)
Neutro Abs: 2.8 10*3/uL (ref 1.4–7.7)
Neutrophils Relative %: 53.5 % (ref 43.0–77.0)
Platelets: 266 10*3/uL (ref 150.0–400.0)
RBC: 5.3 Mil/uL — ABNORMAL HIGH (ref 3.87–5.11)
RDW: 15.5 % (ref 11.5–15.5)
WBC: 5.2 10*3/uL (ref 4.0–10.5)

## 2020-08-06 LAB — LIPID PANEL
Cholesterol: 167 mg/dL (ref 0–200)
HDL: 47.2 mg/dL (ref >39.00)
LDL Cholesterol: 104 mg/dL — ABNORMAL HIGH (ref 0–99)
NonHDL: 119.62
Total CHOL/HDL Ratio: 4
Triglycerides: 78 mg/dL (ref 0.0–149.0)
VLDL: 15.6 mg/dL (ref 0.0–40.0)

## 2020-08-06 LAB — MICROALBUMIN / CREATININE URINE RATIO
Creatinine,U: 79.2 mg/dL
Microalb Creat Ratio: 9.9 mg/g (ref 0.0–30.0)
Microalb, Ur: 7.9 mg/dL — ABNORMAL HIGH (ref 0.0–1.9)

## 2020-08-06 LAB — COMPREHENSIVE METABOLIC PANEL
ALT: 25 U/L (ref 0–35)
AST: 18 U/L (ref 0–37)
Albumin: 3.9 g/dL (ref 3.5–5.2)
Alkaline Phosphatase: 85 U/L (ref 39–117)
BUN: 11 mg/dL (ref 6–23)
CO2: 31 mEq/L (ref 19–32)
Calcium: 9.2 mg/dL (ref 8.4–10.5)
Chloride: 98 mEq/L (ref 96–112)
Creatinine, Ser: 0.72 mg/dL (ref 0.40–1.20)
GFR: 84.26 mL/min (ref >60.00)
Glucose, Bld: 227 mg/dL — ABNORMAL HIGH (ref 70–99)
Potassium: 3.8 mEq/L (ref 3.5–5.1)
Sodium: 135 mEq/L (ref 135–145)
Total Bilirubin: 0.5 mg/dL (ref 0.2–1.2)
Total Protein: 7.5 g/dL (ref 6.0–8.3)

## 2020-08-06 LAB — TSH: TSH: 1.84 u[IU]/mL (ref 0.35–4.50)

## 2020-08-06 LAB — HEMOGLOBIN A1C: Hgb A1c MFr Bld: 12.8 % — ABNORMAL HIGH (ref 4.6–6.5)

## 2020-08-06 MED ORDER — OZEMPIC (0.25 OR 0.5 MG/DOSE) 2 MG/1.5ML ~~LOC~~ SOPN: PEN_INJECTOR | SUBCUTANEOUS | 3 refills | Status: AC

## 2020-08-06 MED ORDER — HYDROCHLOROTHIAZIDE 25 MG PO TABS: 25.0000 mg | ORAL_TABLET | Freq: Every day | ORAL | 1 refills | Status: DC

## 2020-08-06 MED ORDER — LINACLOTIDE 72 MCG PO CAPS: 72.0000 ug | ORAL_CAPSULE | Freq: Every day | ORAL | 1 refills | Status: DC

## 2020-08-06 MED ORDER — IBUPROFEN 800 MG PO TABS: 800.0000 mg | ORAL_TABLET | Freq: Two times a day (BID) | ORAL | 1 refills | Status: DC | PRN

## 2020-08-06 NOTE — Assessment & Plan Note (Signed)
Chronic Poorly controlled Stressed better control Discussed diabetic diet Stress weight loss Continue glipizide 10 mg BID Currently taking metformin 500 mg BID - increase to 1000 mg BID Retry ozempic - - she is willing to retry this to see if this helps weight loss - if she has side effects she will let me know so we can change it A1c, microalbumin

## 2020-08-06 NOTE — Assessment & Plan Note (Signed)
Chronic Stressed better sugar controlled On gabapentin - prescribed by pain management - continue 100 mg 203 time daily prn

## 2020-08-06 NOTE — Assessment & Plan Note (Signed)
Chronic Following with pain management 

## 2020-08-06 NOTE — Assessment & Plan Note (Signed)
Discussed importance of weight loss Stressed regular exercise - goal 30 minutes 5 days a week Discussed decreasing portions, decreasing sugars/carbs Increase veges, lean protin Consider nutrition referral   

## 2020-08-06 NOTE — Assessment & Plan Note (Signed)
Chronic Check lipid panel  Not on a statin- had side effects with prior statins Regular exercise and healthy diet encouraged

## 2020-08-06 NOTE — Assessment & Plan Note (Signed)
Chronic BP not ideally controlled Stressed weight loss Continue current medications - amlodipine 10 mg daily, hctz 25 mg daily If BP is not controlled at next visit will need to add medication    cmp

## 2020-08-12 DIAGNOSIS — G894 Chronic pain syndrome: Secondary | ICD-10-CM | POA: Diagnosis not present

## 2020-08-13 ENCOUNTER — Telehealth: Payer: Self-pay | Admitting: Internal Medicine

## 2020-08-13 MED ORDER — METFORMIN HCL ER 500 MG PO TB24: 500.0000 mg | ORAL_TABLET | Freq: Two times a day (BID) | ORAL | 1 refills | Status: DC

## 2020-08-13 NOTE — Telephone Encounter (Signed)
Patient states metFORMIN (GLUCOPHAGE-XR) 500 MG 24 hr tablet is causing her stomach pain  Seeking advice

## 2020-08-13 NOTE — Telephone Encounter (Signed)
I thought she has been on the metformin at this dose for a while and we just started the ozempic -- is she sure it is not the ozempic?

## 2020-08-13 NOTE — Telephone Encounter (Signed)
Decrease metformin to one tab twice daily to see if she can tolerate that - if not try one pill a day - if she does not tolerate that then stop completely.

## 2020-08-16 ENCOUNTER — Other Ambulatory Visit: Payer: Self-pay | Admitting: Internal Medicine

## 2020-08-19 ENCOUNTER — Other Ambulatory Visit: Payer: Self-pay | Admitting: Internal Medicine

## 2020-08-23 DIAGNOSIS — J45909 Unspecified asthma, uncomplicated: Secondary | ICD-10-CM | POA: Diagnosis not present

## 2020-08-23 DIAGNOSIS — J42 Unspecified chronic bronchitis: Secondary | ICD-10-CM | POA: Diagnosis not present

## 2020-08-25 DIAGNOSIS — G894 Chronic pain syndrome: Secondary | ICD-10-CM | POA: Diagnosis not present

## 2020-08-28 ENCOUNTER — Ambulatory Visit: Payer: Medicare Other

## 2020-09-09 DIAGNOSIS — G894 Chronic pain syndrome: Secondary | ICD-10-CM | POA: Diagnosis not present

## 2020-09-12 ENCOUNTER — Ambulatory Visit: Payer: Medicare Other | Admitting: Physical Therapy

## 2020-09-21 ENCOUNTER — Other Ambulatory Visit: Payer: Self-pay | Admitting: Internal Medicine

## 2020-09-23 DIAGNOSIS — J42 Unspecified chronic bronchitis: Secondary | ICD-10-CM | POA: Diagnosis not present

## 2020-09-23 DIAGNOSIS — J45909 Unspecified asthma, uncomplicated: Secondary | ICD-10-CM | POA: Diagnosis not present

## 2020-09-24 DIAGNOSIS — G894 Chronic pain syndrome: Secondary | ICD-10-CM | POA: Diagnosis not present

## 2020-10-01 ENCOUNTER — Telehealth: Payer: Self-pay | Admitting: Internal Medicine

## 2020-10-01 NOTE — Telephone Encounter (Signed)
   Patient requesting sample ACCU Chek glucometer kit if available  If not she is requesting order for kit be sent to [id number] - [place], [place]   Please call patient

## 2020-10-02 DIAGNOSIS — Z961 Presence of intraocular lens: Secondary | ICD-10-CM | POA: Diagnosis not present

## 2020-10-02 DIAGNOSIS — E119 Type 2 diabetes mellitus without complications: Secondary | ICD-10-CM | POA: Diagnosis not present

## 2020-10-02 DIAGNOSIS — H16223 Keratoconjunctivitis sicca, not specified as Sjogren's, bilateral: Secondary | ICD-10-CM | POA: Diagnosis not present

## 2020-10-02 DIAGNOSIS — H40023 Open angle with borderline findings, high risk, bilateral: Secondary | ICD-10-CM | POA: Diagnosis not present

## 2020-10-03 MED ORDER — BLOOD GLUCOSE MONITOR KIT: PACK | 0 refills | Status: DC

## 2020-10-04 ENCOUNTER — Other Ambulatory Visit: Payer: Self-pay | Admitting: Internal Medicine

## 2020-10-07 DIAGNOSIS — M170 Bilateral primary osteoarthritis of knee: Secondary | ICD-10-CM | POA: Diagnosis not present

## 2020-10-07 DIAGNOSIS — M5136 Other intervertebral disc degeneration, lumbar region: Secondary | ICD-10-CM | POA: Diagnosis not present

## 2020-10-07 DIAGNOSIS — G894 Chronic pain syndrome: Secondary | ICD-10-CM | POA: Diagnosis not present

## 2020-10-07 DIAGNOSIS — Z79891 Long term (current) use of opiate analgesic: Secondary | ICD-10-CM | POA: Diagnosis not present

## 2020-10-14 ENCOUNTER — Encounter: Payer: Self-pay | Admitting: Physical Therapy

## 2020-10-14 ENCOUNTER — Other Ambulatory Visit: Payer: Self-pay

## 2020-10-14 ENCOUNTER — Ambulatory Visit: Payer: Medicare Other | Attending: Internal Medicine | Admitting: Physical Therapy

## 2020-10-14 DIAGNOSIS — R293 Abnormal posture: Secondary | ICD-10-CM | POA: Diagnosis not present

## 2020-10-14 DIAGNOSIS — M6281 Muscle weakness (generalized): Secondary | ICD-10-CM | POA: Insufficient documentation

## 2020-10-14 DIAGNOSIS — M62830 Muscle spasm of back: Secondary | ICD-10-CM | POA: Insufficient documentation

## 2020-10-14 DIAGNOSIS — M545 Low back pain, unspecified: Secondary | ICD-10-CM | POA: Insufficient documentation

## 2020-10-14 DIAGNOSIS — G8929 Other chronic pain: Secondary | ICD-10-CM | POA: Diagnosis not present

## 2020-10-14 NOTE — Therapy (Addendum)
[facility] [address] [place], [place], [address] Phone: [phone]   Fax:  [phone]  Physical Therapy Evaluation  Patient Details  Name: Lindsay PonderSylvia Hardy MRN: [id number] Date of Birth: 1952/07/72 Referring Provider (PT): Pincus SanesBurns, Stacy J, MD   Encounter Date: 10/15/70   PT End of Session - 10/14/20 1548    Visit Number 1    Number of Visits 9    Date for PT Re-Evaluation 12/09/20    Authorization Type MCR: FOTO 6th and 10th visit    Progress Note Due on Visit 10    PT Start Time 1548    PT Stop Time 1620    PT Time Calculation (min) 32 min    Activity Tolerance Patient tolerated treatment well    Behavior [facility] for tasks assessed/performed           Past Medical History:  Diagnosis Date  . Asthma   . CAD (coronary artery disease) 11/2003    RCA 60% stenosis, LAD diffuse  . Chicken pox   . CVA (cerebrovascular accident) [facility](HCC) 2001    right frontoparietal cortical CVA - MRI September 2001  . Diabetes mellitus   . Diastolic dysfunction     ejection fraction 55-65% on 2-D echo May 2006  . Empty sella syndrome [facility](HCC)     based on MRI September 2001, related to obesity, hypertension, it was determined that patient has not required treatment but will be monitored with TSH, ACTH, cortisol, testosterone, prolactin, growth hormone  . GERD (gastroesophageal reflux disease)     EGD June 2004 -  followed by Dr. Leone PayorGessner  . Granulomatous disease, chronic (HCC)     in right upper lobe per x-ray December 2005  . Hyperlipidemia   . Hypertension   . NAFLD (nonalcoholic fatty liver disease)     based on ultrasound June 2002, elevated transaminase  . OA (osteoarthritis)     DJP coracoclavicular ligament, left patella osteophytes June 2002, L4-S1 spondylosis, degenerative disc disease with bulge. I in October 2001, the C2-C4 spondylosis without stenosis and with spurs based on x-ray July 2005; diffuse idiopathic  skeletal hyperostosis with bilateral hip lumbar degenerative changes   on x-ray February 2005, bilateral plantar calcaneal spurs  . Reactive airway disease     peak flow 180 in May 2005, chronic sinusitis with PND, bronchitis December 2002 and strep pneumonia April 2007,  FEC 66, MCV 60, ratio is 69, DLCO 57,  moderate restrictive and mild obstructive disease  on spirometry May 2005    Past Surgical History:  Procedure Laterality Date  . CARPAL TUNNEL RELEASE  1992    left arm 1992  . CHOLECYSTECTOMY  2003  . TONSILLECTOMY AND ADENOIDECTOMY      There were no vitals filed for this visit.    Subjective Assessment - 10/14/20 1554    Subjective pt reprort CC low back pain that started 2015 with no specific onset or cause. She reports feeling like increased weakness in the back, and reports having fall when this first began landing on both knees. since onset the pain in the back seems to be worsening. prior to onset she reports no hx of back pain. She denies any red flags.    Limitations Walking;Standing;Sitting    How long can you sit comfortably? unlimited issues is getting out of seated position    How long can you stand comfortably? less than 5 min    How long can you walk comfortably? less than 5  min    Diagnostic tests nohting for the back: pt notes being 2016    Currently in Pain? Yes    Pain Score 6    at worst 10/10   Pain Location Back    Pain Orientation Right;Left    Pain Descriptors / Indicators Stabbing;Pressure;Aching    Pain Type Chronic pain    Pain Onset More than a month ago    Pain Frequency Constant    Aggravating Factors  any activity    Pain Relieving Factors heating pad, pain medication    Effect of Pain on Daily Activities limited positional tolerance                        [facility] PT Assessment - 10/29/20 0001      Assessment   Medical Diagnosis Chronic midline low back pain with bilateral sciatica    Referring Provider (PT) Pincus Sanes, MD     Onset Date/Surgical Date --   2015   Hand Dominance Right    Next MD Visit 11/06/2020    Prior Therapy yes   for the back at another lcoation     Precautions   Precautions None      Restrictions   Weight Bearing Restrictions No      Home Environment   Living Environment Private residence    Living Arrangements Alone    Type of Home Apartment    Home Access Level entry    Home Layout One level    Home Equipment Walker - 2 wheels      Prior Function   Level of Independence Independent with basic ADLs    Vocation Retired      Observation/Other Assessments   Focus on Therapeutic Outcomes (FOTO)  32%   48% limited     Posture/Postural Control   Posture/Postural Control Postural limitations    Postural Limitations Rounded Shoulders;Forward head      AROM   Overall AROM Comments pain noted in all planes    Lumbar Flexion 10    Lumbar Extension 10    Lumbar - Right Side Bend 10    Lumbar - Left Side Bend 10      Strength   Overall Strength Comments core strength 3-/5    Right Hip Flexion 3+/5    Right Hip Extension 3+/5    Right Hip ABduction 3+/5    Left Hip Flexion 3+/5    Left Hip Extension 3+/5    Left Hip ABduction 3+/5    Right Knee Flexion 3+/5    Right Knee Extension 4-/5    Left Knee Flexion 3+/5    Left Knee Extension 4-/5      Palpation   Palpation comment TTP along the bil lumbar paraspinals with spasm noted               Objective measurements completed on examination: See above findings.               PT Education - 10/14/20 1604    Education Details evaluation findings, POC, goals, HEP with proper form, FOTO assessment.    Person(s) Educated Patient    Methods Explanation;Handout;Verbal cues    Comprehension Verbalized understanding;Verbal cues required            PT Short Term Goals - 10/14/20 1628      PT SHORT TERM GOAL #1   Title pt to be IND with initial HEP    Baseline -  Time 4    Period Weeks    Status New     Target Date 11/11/20      PT SHORT TERM GOAL #2   Title pt to report low back pain to </= 6/10 to allow for therapuetic progression    Baseline -    Time 4    Period Weeks    Status New    Target Date 11/11/20      PT SHORT TERM GOAL #3   Title -    Baseline -             PT Long Term Goals - 10/14/20 1629      PT LONG TERM GOAL #1   Title increase trunk flexion to >/= 40 degrees and extension/ bil sidebending by >/= 5 degrees for functional ROM required for ADLs    Time 8    Period Weeks    Status New    Target Date 12/09/20      PT LONG TERM GOAL #2   Title increase gross bil LE strength to >/= 4/5 to promote posture, hip/ knee stability with walking/ standing    Time 8    Period Weeks    Status New    Target Date 12/09/20      PT LONG TERM GOAL #3   Title pt to be able to sit, stand and walk for >/= 30 min for in home / short community ambulation safely    Baseline -    Time 8    Period Weeks    Status New    Target Date 12/09/20      PT LONG TERM GOAL #4   Title increase FOTO score to >/= 48% limited to demo improvement in function    Time 8    Period Weeks    Status New    Target Date 12/09/20      PT LONG TERM GOAL #5   Title pt to be IND with advanced HEP and is able to maintain and progress current LOF IND.    Time 8    Period Weeks    Status New    Target Date 12/09/20                  Plan - 10/14/20 1602    Clinical Impression Statement pt is a pleasant 70 y.o F with CC of chronic low back pain starting in 2015 with no specific MOI. She exhibits significant limitation with trunk AROM secondary to pain and guarding. MMT revealed gross core and bil LE weakness with report of pain noted during assessment. Limited assessment due to pain irritability and pt noted she needed to leave a little early. She would benefit from physical therapy to decrease back pain, reduce muscle spasm, improve trunk mobility, promote bil LE strength and maximize  her function by addressing the deficits listed.    Personal Factors and Comorbidities Comorbidity 3+    Comorbidities hx of stroke, DM, CAD    Examination-Activity Limitations Stand;Stairs;Squat;Lift;Locomotion Level;Sit    Stability/Clinical Decision Making Unstable/Unpredictable    Clinical Decision Making High    Rehab Potential Fair    PT Frequency 1x / week    PT Duration 8 weeks    PT Treatment/Interventions ADLs/Self Care Home Management;Electrical Stimulation;Iontophoresis 4mg /ml Dexamethasone;Moist Heat;Traction;Ultrasound;Gait training;Stair training;Functional mobility training;Therapeutic activities;Therapeutic exercise;Neuromuscular re-education;Manual techniques;Passive range of motion;Dry needling;Splinting;Patient/family education;Taping;Aquatic Therapy    PT Next Visit Plan review / update HEP PRN, STW for low back, gentle core/ hip  strengthening, posture education, print aquatic therapy info    PT Home Exercise Plan [id number] - LTR, supine marching, posterior pelvic tilt, clamshell    Consulted and Agree with Plan of Care Patient           Patient will benefit from skilled therapeutic intervention in order to improve the following deficits and impairments:  Increased muscle spasms,Improper body mechanics,Decreased strength,Abnormal gait,Postural dysfunction,Pain,Obesity,Decreased activity tolerance,Decreased endurance,Decreased range of motion  Visit Diagnosis: Chronic bilateral low back pain without sciatica - Plan: PT plan of care cert/re-cert  Muscle weakness (generalized) - Plan: PT plan of care cert/re-cert  Abnormal posture - Plan: PT plan of care cert/re-cert  Muscle spasm of back - Plan: PT plan of care cert/re-cert     Problem List Patient Active Problem List   Diagnosis Date Noted  . Allergic rhinitis 11/22/2018  . External hemorrhoids 10/07/2018  . Chronic constipation 10/07/2018  . Morbid obesity (HCC) 04/01/2017  . PAD (peripheral artery disease)  (HCC) 10/29/2016  . Chronic back pain 09/23/2016  . Diabetic peripheral neuropathy (HCC) 02/05/2016  . Uncontrolled diabetes mellitus (HCC) 06/17/2006  . Hyperlipidemia 06/17/2006  . Essential hypertension 06/17/2006  . Coronary atherosclerosis 06/17/2006  . DIASTOLIC DYSFUNCTION 06/17/2006  . DISEASE, CEREBROVASCULAR NEC 06/17/2006  . Osteoarthritis of both knees 06/17/2006   Lulu Riding PT, DPT, LAT, ATC  10/15/20  10:08 AM      [facility] [address] [place], [place], [address] Phone: [phone]   Fax:  [phone]  Name: Shelton Soler MRN: [id number] Date of Birth: 06-29-1950    Lulu Riding PT, DPT, LAT, ATC  10/29/20  8:42 AM

## 2020-10-22 ENCOUNTER — Telehealth: Payer: Self-pay | Admitting: Internal Medicine

## 2020-10-22 NOTE — Telephone Encounter (Signed)
LVM for pt to rtn my call to schedule AWV with [facility]. Please schedule appt if pt calls the office.  

## 2020-10-23 DIAGNOSIS — J45909 Unspecified asthma, uncomplicated: Secondary | ICD-10-CM | POA: Diagnosis not present

## 2020-10-23 DIAGNOSIS — J42 Unspecified chronic bronchitis: Secondary | ICD-10-CM | POA: Diagnosis not present

## 2020-10-24 DIAGNOSIS — G894 Chronic pain syndrome: Secondary | ICD-10-CM | POA: Diagnosis not present

## 2020-10-27 ENCOUNTER — Other Ambulatory Visit: Payer: Self-pay | Admitting: Internal Medicine

## 2020-10-29 ENCOUNTER — Ambulatory Visit: Payer: Medicare Other | Admitting: Physical Therapy

## 2020-11-05 ENCOUNTER — Other Ambulatory Visit: Payer: Self-pay

## 2020-11-05 DIAGNOSIS — I700 Atherosclerosis of aorta: Secondary | ICD-10-CM | POA: Insufficient documentation

## 2020-11-05 NOTE — Patient Instructions (Signed)
°  Blood work was ordered.   ° ° °Medications changes include :    ° °Your prescription(s) have been submitted to your pharmacy. Please take as directed and contact our office if you believe you are having problem(s) with the medication(s). ° ° °A referral was ordered for        Someone from their office will call you to schedule an appointment.  ° ° °Please followup in 3 months ° °

## 2020-11-05 NOTE — Progress Notes (Signed)
Subjective:    Patient ID: Lindsay Hardy, female    DOB: 1950/10/17, 70 y.o.   MRN: [id number]  HPI The patient is here for follow up of their chronic medical problems, including DM with neuropathy, htn, hld, chronic back pain, obesity    Medications and allergies reviewed with patient and updated if appropriate.  Patient Active Problem List   Diagnosis Date Noted  . Allergic rhinitis 11/22/2018  . External hemorrhoids 10/07/2018  . Chronic constipation 10/07/2018  . Morbid obesity ([place]) 04/01/2017  . PAD (peripheral artery disease) ([place]) 10/29/2016  . Chronic back pain 09/23/2016  . Diabetic peripheral neuropathy ([place]) 02/05/2016  . Uncontrolled diabetes mellitus ([place]) 06/17/2006  . Hyperlipidemia 06/17/2006  . Essential hypertension 06/17/2006  . Coronary atherosclerosis 06/17/2006  . DIASTOLIC DYSFUNCTION 45/99/7741  . DISEASE, CEREBROVASCULAR NEC 06/17/2006  . Osteoarthritis of both knees 06/17/2006    Current Outpatient Medications on File Prior to Visit  Medication Sig Dispense Refill  . ACCU-CHEK GUIDE test strip USE UP TO FOUR TIMES DAILY AS DIRECTED 100 strip 5  . Accu-Chek Softclix Lancets lancets USE UP TO FOUR TIMES DAILY AS DIRECTED 100 each 1  . albuterol (VENTOLIN HFA) 108 (90 Base) MCG/ACT inhaler INHALE 2 PUFFS INTO THE LUNGS EVERY 4 (FOUR) HOURS AS NEEDED FOR SHORTNESS OF BREATH. 6.7 g 11  . amLODipine (NORVASC) 10 MG tablet TAKE 1 TABLET BY MOUTH EVERY DAY 90 tablet 1  . aspirin 81 MG EC tablet TAKE 1 TABLET (81 MG TOTAL) BY MOUTH DAILY. 90 tablet 1  . blood glucose meter kit and supplies KIT Accu-Chek. Use up to four times daily as directed. (FOR E11.65). 1 each 0  . Blood Glucose Monitoring Suppl (ONETOUCH VERIO IQ SYSTEM) w/Device KIT Use to check blood sugar once to twice daily. 1 kit 0  . diclofenac Sodium (VOLTAREN) 1 % GEL Apply 4 g topically 4 (four) times daily. 350 g 5  . fluticasone (FLONASE) 50 MCG/ACT nasal spray Place 2 sprays into both  nostrils daily. 16 g 6  . glipiZIDE (GLUCOTROL) 10 MG tablet TAKE 1 TABLET (10 MG TOTAL) BY MOUTH 2 (TWO) TIMES DAILY BEFORE A MEAL. 180 tablet 1  . hydrochlorothiazide (HYDRODIURIL) 25 MG tablet Take 1 tablet (25 mg total) by mouth daily. 90 tablet 1  . HYDROcodone-acetaminophen (NORCO) 10-325 MG tablet SMARTSIG:1 Tablet(s) By Mouth 4-6 Times Daily    . hydrocortisone (ANUSOL-HC) 2.5 % rectal cream Place 1 application rectally 2 (two) times daily. 30 g 5  . ibuprofen (ADVIL) 800 MG tablet Take 1 tablet (800 mg total) by mouth 2 (two) times daily as needed for moderate pain. 180 tablet 1  . linaclotide (LINZESS) 72 MCG capsule Take 1 capsule (72 mcg total) by mouth daily before breakfast. 90 capsule 1  . metFORMIN (GLUCOPHAGE-XR) 500 MG 24 hr tablet Take 1 tablet (500 mg total) by mouth 2 (two) times daily before a meal. 180 tablet 1  . Misc. Devices (BARIATRIC ROLLATOR) MISC Use as directed.  Dx  M54.9 1 each 0  . montelukast (SINGULAIR) 10 MG tablet TAKE 1 TABLET BY MOUTH EVERYDAY AT BEDTIME 90 tablet 1  . NARCAN 4 MG/0.1ML LIQD nasal spray kit 1 spray once.    . nystatin cream (MYCOSTATIN) Apply 1 application topically 2 (two) times daily. For stomach or under breasts 30 g 5  . potassium chloride (MICRO-K) 10 MEQ CR capsule TAKE 1 CAPSULE BY MOUTH TWICE A DAY 180 capsule 1  . RESTASIS 0.05 % ophthalmic  emulsion     . Semaglutide,0.25 or 0.5MG/DOS, (OZEMPIC, 0.25 OR 0.5 MG/DOSE,) 2 MG/1.5ML SOPN Inject 0.25 mg into the skin once a week for 30 days, THEN 0.5 mg once a week. 1.5 mL 3   No current facility-administered medications on file prior to visit.    Past Medical History:  Diagnosis Date  . Asthma   . CAD (coronary artery disease) 11/2003    RCA 60% stenosis, LAD diffuse  . Chicken pox   . CVA (cerebrovascular accident) [facility]) 2001    right frontoparietal cortical CVA - MRI September 2001  . Diabetes mellitus   . Diastolic dysfunction     ejection fraction 55-65% on 2-D echo May  2006  . Empty sella syndrome [facility])     based on MRI September 2001, related to obesity, hypertension, it was determined that patient has not required treatment but will be monitored with TSH, ACTH, cortisol, testosterone, prolactin, growth hormone  . GERD (gastroesophageal reflux disease)     EGD June 2004 -  followed by Dr. Carlean Purl  . Granulomatous disease, chronic (HCC)     in right upper lobe per x-ray December 2005  . Hyperlipidemia   . Hypertension   . NAFLD (nonalcoholic fatty liver disease)     based on ultrasound June 2002, elevated transaminase  . OA (osteoarthritis)     DJP coracoclavicular ligament, left patella osteophytes June 2002, L4-S1 spondylosis, degenerative disc disease with bulge. I in October 2001, the C2-C4 spondylosis without stenosis and with spurs based on x-ray July 2005; diffuse idiopathic skeletal hyperostosis with bilateral hip lumbar degenerative changes   on x-ray February 2005, bilateral plantar calcaneal spurs  . Reactive airway disease     peak flow 180 in May 2005, chronic sinusitis with PND, bronchitis December 2002 and strep pneumonia April 2007,  FEC 66, MCV 60, ratio is 69, DLCO 57,  moderate restrictive and mild obstructive disease  on spirometry May 2005    Past Surgical History:  Procedure Laterality Date  . CARPAL TUNNEL RELEASE  1992    left arm 1992  . CHOLECYSTECTOMY  2003  . TONSILLECTOMY AND ADENOIDECTOMY      Social History   Socioeconomic History  . Marital status: Single    Spouse name: Not on file  . Number of children: 1  . Years of education: 57  . Highest education level: Not on file  Occupational History  . Occupation:  former Psychologist, counselling  Tobacco Use  . Smoking status: Former Smoker    Packs/day: 0.25    Years: 4.00    Pack years: 1.00    Types: Cigarettes  . Smokeless tobacco: Never Used  . Tobacco comment: smoked in 20's   Vaping Use  . Vaping Use: Never used  Substance and Sexual Activity  . Alcohol use:  No    Alcohol/week: 0.0 standard drinks  . Drug use: No  . Sexual activity: Not Currently    Birth control/protection: Post-menopausal  Other Topics Concern  . Not on file  Social History Narrative   Born and raised in [place], [place]. Currently lives in a private residence with her nephew and his wife. Fun: Church activities, spending time with family   Denies any religious beliefs that would effect health care.    Social Determinants of Health   Financial Resource Strain: Not on file  Food Insecurity: Not on file  Transportation Needs: Not on file  Physical Activity: Not on file  Stress: Not on file  Social  Connections: Not on file    Family History  Problem Relation Age of Onset  . Lung cancer Mother   . Hypertension Mother   . Lung cancer Sister   . Hypertension Sister     Review of Systems     Objective:  There were no vitals filed for this visit. BP Readings from Last 3 Encounters:  08/06/20 (!) 148/86  12/29/19 (!) 130/70  11/24/19 (!) 167/123   Wt Readings from Last 3 Encounters:  08/06/20 274 lb (124.3 kg)  12/29/19 (!) 270 lb (122.5 kg)  11/23/19 268 lb (121.6 kg)   There is no height or weight on file to calculate BMI.   Physical Exam    Constitutional: Appears well-developed and well-nourished. No distress.  HENT:  Head: Normocephalic and atraumatic.  Neck: Neck supple. No tracheal deviation present. No thyromegaly present.  No cervical lymphadenopathy Cardiovascular: Normal rate, regular rhythm and normal heart sounds.   No murmur heard. No carotid bruit .  No edema Pulmonary/Chest: Effort normal and breath sounds normal. No respiratory distress. No has no wheezes. No rales.  Skin: Skin is warm and dry. Not diaphoretic.  Psychiatric: Normal mood and affect. Behavior is normal.      Assessment & Plan:    See Problem List for Assessment and Plan of chronic medical problems.    This visit occurred during the SARS-CoV-2 public health  emergency.  Safety protocols were in place, including screening questions prior to the visit, additional usage of staff PPE, and extensive cleaning of exam room while observing appropriate contact time as indicated for disinfecting solutions.    This encounter was created in error - please disregard.

## 2020-11-06 ENCOUNTER — Ambulatory Visit: Payer: Medicare Other | Attending: Internal Medicine | Admitting: Physical Therapy

## 2020-11-06 ENCOUNTER — Encounter: Payer: Self-pay | Admitting: Physical Therapy

## 2020-11-06 ENCOUNTER — Other Ambulatory Visit: Payer: Self-pay

## 2020-11-06 ENCOUNTER — Encounter: Payer: Medicare Other | Admitting: Internal Medicine

## 2020-11-06 DIAGNOSIS — G8929 Other chronic pain: Secondary | ICD-10-CM

## 2020-11-06 DIAGNOSIS — E1142 Type 2 diabetes mellitus with diabetic polyneuropathy: Secondary | ICD-10-CM

## 2020-11-06 DIAGNOSIS — M62830 Muscle spasm of back: Secondary | ICD-10-CM | POA: Insufficient documentation

## 2020-11-06 DIAGNOSIS — R293 Abnormal posture: Secondary | ICD-10-CM | POA: Insufficient documentation

## 2020-11-06 DIAGNOSIS — M545 Low back pain, unspecified: Secondary | ICD-10-CM | POA: Insufficient documentation

## 2020-11-06 DIAGNOSIS — M6281 Muscle weakness (generalized): Secondary | ICD-10-CM | POA: Diagnosis not present

## 2020-11-06 DIAGNOSIS — E782 Mixed hyperlipidemia: Secondary | ICD-10-CM

## 2020-11-06 DIAGNOSIS — E1165 Type 2 diabetes mellitus with hyperglycemia: Secondary | ICD-10-CM

## 2020-11-06 DIAGNOSIS — I10 Essential (primary) hypertension: Secondary | ICD-10-CM

## 2020-11-06 NOTE — Patient Instructions (Signed)
Aquatic Therapy at [facility]-  What to Expect!  Where:   [facility] [address] [facility] [place], [place] [address] Rehab phone [phone]  NOTE:  You will receive an automated phone message reminding you of your appt and it will say the appointment is at the [address] [facility] [address] [facility] clinic.          How to Prepare: . Please make sure you drink 8 ounces of water about one hour prior to your pool session . A caregiver may attend if needed with the patient to help assist as needed. A caregiver can sit in the pool room on chair. . Please arrive IN YOUR SUIT and 15 minutes prior to your appointment - this helps to avoid delays in starting your session. . Please make sure to attend to any toileting needs prior to entering the pool . Locker rooms for changing are provided.   There is direct access to the pool deck form the locker room.  You can lock your belongings in a locker with lock provided. . Once on the pool deck your therapist will ask if you have signed the Patient  Consent and Assignment of Benefits form before beginning treatment . Your therapist may take your blood pressure prior to, during and after your session if indicated . We usually try and create a home exercise program based on activities we do in the pool.  Please be thinking about who might be able to assist you in the pool should you need to participate in an aquatic home exercise program at the time of discharge if you need assistance.  Some patients do not want to or do not have the ability to participate in an aquatic home program - this is not a barrier in any way to you participating in aquatic therapy as part of your current therapy plan! . After Discharge from PT, you can continue using home program at  the [facility]/, there is a drop-in fee for $5 ($45 a month)or for 60 years  or older $4.00 ($40 a month for seniors ) or any local [employer] pool.  Memberships  for purchase are available for gym/pool at [facility]  It is very important that your last visit be in the clinic at [address] after your last aquatic visit.    Please make sure that you have a land/[address] on the calendar.   About the pool: 1. Pool is located 500 FT from the entrance of the building.  2. Entering the pool Your therapist will assist you; there are multiple ways to enter including stairs with railings, a walk in ramp, a roll in chair and a mechanical lift. Your therapist will determine the most appropriate way for you. 3. Water temperature is usually between 86-87 degrees 4. There may be other swimmers in the pool at the same time      Contact Info: For scheduling and cancellations        Appointments: Please call the [facility]    [phone]             Center if you need to cancel or reschedule an appointment at [phone] [place]             Pool. [facility]patient [facility]         All sessions are 45 minutes [address] [place], [place] [address]                                                  

## 2020-11-06 NOTE — Therapy (Signed)
[facility] Outpatient Rehabilitation [facility] [address] [place], [place], [address] Phone: [phone]   Fax:  [phone]  Physical Therapy Treatment  Patient Details  Name: Lindsay Hardy MRN: [id number] Date of Birth: Aug 11, 1950 Referring Provider (PT): Pincus Sanes, MD   Encounter Date: 11/06/2020   PT End of Session - 11/06/20 1625    Visit Number 2    Number of Visits 9    Date for PT Re-Evaluation 12/09/20    Authorization Type MCR: FOTO 6th and 10th visit    Progress Note Due on Visit 10    PT Start Time 1615    PT Stop Time 1658    PT Time Calculation (min) 43 min    Activity Tolerance Patient tolerated treatment well    Behavior During Therapy [facility] for tasks assessed/performed           Past Medical History:  Diagnosis Date  . Asthma   . CAD (coronary artery disease) 11/2003    RCA 60% stenosis, LAD diffuse  . Chicken pox   . CVA (cerebrovascular accident) [facility] 2001    right frontoparietal cortical CVA - MRI September 2001  . Diabetes mellitus   . Diastolic dysfunction     ejection fraction 55-65% on 2-D echo May 2006  . Empty sella syndrome [facility])     based on MRI September 2001, related to obesity, hypertension, it was determined that patient has not required treatment but will be monitored with TSH, ACTH, cortisol, testosterone, prolactin, growth hormone  . GERD (gastroesophageal reflux disease)     EGD June 2004 -  followed by Dr. Leone Payor  . Granulomatous disease, chronic (HCC)     in right upper lobe per x-ray December 2005  . Hyperlipidemia   . Hypertension   . NAFLD (nonalcoholic fatty liver disease)     based on ultrasound June 2002, elevated transaminase  . OA (osteoarthritis)     DJP coracoclavicular ligament, left patella osteophytes June 2002, L4-S1 spondylosis, degenerative disc disease with bulge. I in October 2001, the C2-C4 spondylosis without stenosis and with spurs based on x-ray July 2005; diffuse idiopathic skeletal  hyperostosis with bilateral hip lumbar degenerative changes   on x-ray February 2005, bilateral plantar calcaneal spurs  . Reactive airway disease     peak flow 180 in May 2005, chronic sinusitis with PND, bronchitis December 2002 and strep pneumonia April 2007,  FEC 66, MCV 60, ratio is 69, DLCO 57,  moderate restrictive and mild obstructive disease  on spirometry May 2005    Past Surgical History:  Procedure Laterality Date  . CARPAL TUNNEL RELEASE  1992    left arm 1992  . CHOLECYSTECTOMY  2003  . TONSILLECTOMY AND ADENOIDECTOMY      There were no vitals filed for this visit.   Subjective Assessment - 11/06/20 1623    Subjective Patient reports she is doing alright, nothing new since last visit. She has been doing her exercises and notes a little more pain with them. Patient also notes that she feels when she eats carbs that her back will start bothering her as well.    Patient Stated Goals Get rid of back pain    Currently in Pain? Yes    Pain Score 7     Pain Location Back    Pain Orientation Lower    Pain Descriptors / Indicators Aching    Pain Type Chronic pain    Pain Onset More than a month ago    Pain  Frequency Constant              [facility] PT Assessment - 11/06/20 0001      Strength   Right Knee Flexion 4-/5    Right Knee Extension 4/5    Left Knee Flexion 4-/5    Left Knee Extension 4/5                         [facility] Adult PT Treatment/Exercise - 11/06/20 0001      Self-Care   Self-Care Other Self-Care Comments    Other Self-Care Comments  Aquatic therapy information, healthy alternatives to common carb foods      Exercises   Exercises Lumbar      Lumbar Exercises: Stretches   Passive Hamstring Stretch 2 reps;20 seconds    Passive Hamstring Stretch Limitations seated edge of mat table    Pelvic Tilt 10 reps    Pelvic Tilt Limitations seated    Other Lumbar Stretch Exercise Seated lumbar flexion physioball roll out 5 x 5 sec fwd, 3 x 5 sec  side each      Lumbar Exercises: Aerobic   Nustep L5 x 5 min with UE/LE      Lumbar Exercises: Seated   Long Arc Quad on Chair 2 sets;20 reps    Other Seated Lumbar Exercises Clamshell with red 3 x 20    Other Seated Lumbar Exercises Marching with red band 3 x 20                  PT Education - 11/06/20 1624    Education Details HEP udpate, aquatic therapy information, following-up with doctor about nutritionist but also trying to eat more whole grains or alternatives to carbs    Person(s) Educated Patient    Methods Explanation;Demonstration;Verbal cues;Handout    Comprehension Verbalized understanding;Need further instruction;Returned demonstration;Verbal cues required            PT Short Term Goals - 11/06/20 1703      PT SHORT TERM GOAL #1   Title pt to be IND with initial HEP    Baseline updated HEP    Time 4    Period Weeks    Status On-going    Target Date 11/11/20      PT SHORT TERM GOAL #2   Title pt to report low back pain to </= 6/10 to allow for therapuetic progression    Baseline patient reports 7/10 this visit    Time 4    Period Weeks    Status On-going    Target Date 11/11/20      PT SHORT TERM GOAL #3   Title -    Baseline -             PT Long Term Goals - 10/14/20 1629      PT LONG TERM GOAL #1   Title increase trunk flexion to >/= 40 degrees and extension/ bil sidebending by >/= 5 degrees for functional ROM required for ADLs    Time 8    Period Weeks    Status New    Target Date 12/09/20      PT LONG TERM GOAL #2   Title increase gross bil LE strength to >/= 4/5 to promote posture, hip/ knee stability with walking/ standing    Time 8    Period Weeks    Status New    Target Date 12/09/20      PT LONG TERM GOAL #3  Title pt to be able to sit, stand and walk for >/= 30 min for in home / short community ambulation safely    Baseline -    Time 8    Period Weeks    Status New    Target Date 12/09/20      PT LONG TERM GOAL  #4   Title increase FOTO score to >/= 48% limited to demo improvement in function    Time 8    Period Weeks    Status New    Target Date 12/09/20      PT LONG TERM GOAL #5   Title pt to be IND with advanced HEP and is able to maintain and progress current LOF IND.    Time 8    Period Weeks    Status New    Target Date 12/09/20                 Plan - 11/06/20 1625    Clinical Impression Statement Patient tolerated therapy well with no adverse effects. Therapy focused progressing lumbar mobility and core/hip strength with tolerance. Patient did request to avoid supine exercises because the mask makes it hard for her to breathe on her back and she has increased back pain when lying on hard surfaces like mat table. Seated strengthening and stretching progressed and patient provided exercises for home in case she is unable to perform supine. Patient would benefit from continued skilled PT to progress mobility and strength in order to reduce pain and maximize her functional ability.    PT Treatment/Interventions ADLs/Self Care Home Management;Electrical Stimulation;Iontophoresis 4mg /ml Dexamethasone;Moist Heat;Traction;Ultrasound;Gait training;Stair training;Functional mobility training;Therapeutic activities;Therapeutic exercise;Neuromuscular re-education;Manual techniques;Passive range of motion;Dry needling;Splinting;Patient/family education;Taping;Aquatic Therapy    PT Next Visit Plan review / update HEP PRN, STW for low back, gentle core/ hip strengthening, posture education, print aquatic therapy info    PT Home Exercise Plan    Consulted and Agree with Plan of Care Patient           Patient will benefit from skilled therapeutic intervention in order to improve the following deficits and impairments:  Increased muscle spasms,Improper body mechanics,Decreased strength,Abnormal gait,Postural dysfunction,Pain,Obesity,Decreased activity tolerance,Decreased endurance,Decreased  range of motion  Visit Diagnosis: Chronic bilateral low back pain without sciatica  Muscle weakness (generalized)  Abnormal posture  Muscle spasm of back     Problem List Patient Active Problem List   Diagnosis Date Noted  . Aortic atherosclerosis (HCC) 11/05/2020  . Allergic rhinitis 11/22/2018  . External hemorrhoids 10/07/2018  . Chronic constipation 10/07/2018  . Morbid obesity (HCC) 04/01/2017  . PAD (peripheral artery disease) (HCC) 10/29/2016  . Chronic back pain 09/23/2016  . Diabetic peripheral neuropathy (HCC) 02/05/2016  . Uncontrolled diabetes mellitus (HCC) 06/17/2006  . Hyperlipidemia 06/17/2006  . Essential hypertension 06/17/2006  . Coronary atherosclerosis 06/17/2006  . DIASTOLIC DYSFUNCTION 06/17/2006  . DISEASE, CEREBROVASCULAR NEC 06/17/2006  . Osteoarthritis of both knees 06/17/2006    08/16/2006, PT, DPT, LAT, ATC 11/06/20  5:09 PM Phone: [phone] Fax: [phone]   [facility] Outpatient Rehabilitation [facility] [address] [place], [place], [place] Phone: [phone]   Fax:  [phone]  Name: Lindsay Hardy MRN: [name] Date of Birth: June 01, 1951

## 2020-11-11 DIAGNOSIS — M48062 Spinal stenosis, lumbar region with neurogenic claudication: Secondary | ICD-10-CM | POA: Diagnosis not present

## 2020-11-11 DIAGNOSIS — M47897 Other spondylosis, lumbosacral region: Secondary | ICD-10-CM | POA: Diagnosis not present

## 2020-11-11 DIAGNOSIS — M79609 Pain in unspecified limb: Secondary | ICD-10-CM | POA: Diagnosis not present

## 2020-11-11 DIAGNOSIS — G894 Chronic pain syndrome: Secondary | ICD-10-CM | POA: Diagnosis not present

## 2020-11-11 DIAGNOSIS — E119 Type 2 diabetes mellitus without complications: Secondary | ICD-10-CM | POA: Diagnosis not present

## 2020-11-11 DIAGNOSIS — M5431 Sciatica, right side: Secondary | ICD-10-CM | POA: Diagnosis not present

## 2020-11-11 DIAGNOSIS — R5382 Chronic fatigue, unspecified: Secondary | ICD-10-CM | POA: Diagnosis not present

## 2020-11-11 DIAGNOSIS — M5432 Sciatica, left side: Secondary | ICD-10-CM | POA: Diagnosis not present

## 2020-11-11 DIAGNOSIS — M792 Neuralgia and neuritis, unspecified: Secondary | ICD-10-CM | POA: Diagnosis not present

## 2020-11-11 DIAGNOSIS — M5136 Other intervertebral disc degeneration, lumbar region: Secondary | ICD-10-CM | POA: Diagnosis not present

## 2020-11-11 DIAGNOSIS — G8929 Other chronic pain: Secondary | ICD-10-CM | POA: Diagnosis not present

## 2020-11-13 ENCOUNTER — Other Ambulatory Visit: Payer: Self-pay | Admitting: Internal Medicine

## 2020-11-13 ENCOUNTER — Other Ambulatory Visit: Payer: Self-pay

## 2020-11-13 ENCOUNTER — Encounter: Payer: Self-pay | Admitting: Physical Therapy

## 2020-11-13 ENCOUNTER — Ambulatory Visit: Payer: Medicare Other | Admitting: Physical Therapy

## 2020-11-13 DIAGNOSIS — R293 Abnormal posture: Secondary | ICD-10-CM

## 2020-11-13 DIAGNOSIS — G8929 Other chronic pain: Secondary | ICD-10-CM

## 2020-11-13 DIAGNOSIS — M62830 Muscle spasm of back: Secondary | ICD-10-CM | POA: Diagnosis not present

## 2020-11-13 DIAGNOSIS — M545 Low back pain, unspecified: Secondary | ICD-10-CM | POA: Diagnosis not present

## 2020-11-13 DIAGNOSIS — M6281 Muscle weakness (generalized): Secondary | ICD-10-CM

## 2020-11-13 NOTE — Therapy (Signed)
[facility] Outpatient Rehabilitation [facility] [address] [place], [place], [address] Phone: [phone]   Fax:  [phone]  Physical Therapy Treatment  Patient Details  Name: Lindsay Hardy MRN: [id number] Date of Birth: 1951-03-20 Referring Provider (PT): Pincus Sanes, MD   Encounter Date: 11/13/2020   PT End of Session - 11/13/20 1552    Visit Number 3    Number of Visits 9    Date for PT Re-Evaluation 12/09/20    Authorization Type MCR: FOTO 6th and 10th visit    Progress Note Due on Visit 10    PT Start Time 1548    PT Stop Time 1628    PT Time Calculation (min) 40 min    Activity Tolerance Patient tolerated treatment well    Behavior During Therapy [facility] for tasks assessed/performed           Past Medical History:  Diagnosis Date  . Asthma   . CAD (coronary artery disease) 11/2003    RCA 60% stenosis, LAD diffuse  . Chicken pox   . CVA (cerebrovascular accident) [facility]) 2001    right frontoparietal cortical CVA - MRI September 2001  . Diabetes mellitus   . Diastolic dysfunction     ejection fraction 55-65% on 2-D echo May 2006  . Empty sella syndrome [facility])     based on MRI September 2001, related to obesity, hypertension, it was determined that patient has not required treatment but will be monitored with TSH, ACTH, cortisol, testosterone, prolactin, growth hormone  . GERD (gastroesophageal reflux disease)     EGD June 2004 -  followed by Dr. Leone Payor  . Granulomatous disease, chronic (HCC)     in right upper lobe per x-ray December 2005  . Hyperlipidemia   . Hypertension   . NAFLD (nonalcoholic fatty liver disease)     based on ultrasound June 2002, elevated transaminase  . OA (osteoarthritis)     DJP coracoclavicular ligament, left patella osteophytes June 2002, L4-S1 spondylosis, degenerative disc disease with bulge. I in October 2001, the C2-C4 spondylosis without stenosis and with spurs based on x-ray July 2005; diffuse idiopathic skeletal  hyperostosis with bilateral hip lumbar degenerative changes   on x-ray February 2005, bilateral plantar calcaneal spurs  . Reactive airway disease     peak flow 180 in May 2005, chronic sinusitis with PND, bronchitis December 2002 and strep pneumonia April 2007,  FEC 66, MCV 60, ratio is 69, DLCO 57,  moderate restrictive and mild obstructive disease  on spirometry May 2005    Past Surgical History:  Procedure Laterality Date  . CARPAL TUNNEL RELEASE  1992    left arm 1992  . CHOLECYSTECTOMY  2003  . TONSILLECTOMY AND ADENOIDECTOMY      There were no vitals filed for this visit.   Subjective Assessment - 11/13/20 1552    Subjective " the exercises at home are going pretty good, I feel like they are helping out    Patient Stated Goals Get rid of back pain              [facility] PT Assessment - 11/13/20 0001      Assessment   Medical Diagnosis Chronic midline low back pain with bilateral sciatica    Referring Provider (PT) Pincus Sanes, MD                         [facility] Adult PT Treatment/Exercise - 11/13/20 0001  Exercises   Exercises Knee/Hip      Lumbar Exercises: Aerobic   Nustep L3 x 7 min UE/LE   reviewed HEP during this time.     Lumbar Exercises: Seated   Long Arc Quad on Chair 2 sets;20 reps    Other Seated Lumbar Exercises pressing down with bil UE on to red physioball on pt's lap 2 x 10    Other Seated Lumbar Exercises marching 2 x 10      Knee/Hip Exercises: Seated   Other Seated Knee/Hip Exercises Clamshell with red 3 x 20    Other Seated Knee/Hip Exercises heel raise 2 x 10                  PT Education - 11/13/20 1629    Education Details Reviewed HEP today. discussed PNE and benefits fo controlled exercise as well as pain doesn't mean harm. reprinted aquatic handout.    Person(s) Educated Patient    Methods Explanation;Verbal cues    Comprehension Verbalized understanding;Verbal cues required            PT Short Term  Goals - 11/06/20 1703      PT SHORT TERM GOAL #1   Title pt to be IND with initial HEP    Baseline updated HEP    Time 4    Period Weeks    Status On-going    Target Date 11/11/20      PT SHORT TERM GOAL #2   Title pt to report low back pain to </= 6/10 to allow for therapuetic progression    Baseline patient reports 7/10 this visit    Time 4    Period Weeks    Status On-going    Target Date 11/11/20      PT SHORT TERM GOAL #3   Title -    Baseline -             PT Long Term Goals - 10/14/20 1629      PT LONG TERM GOAL #1   Title increase trunk flexion to >/= 40 degrees and extension/ bil sidebending by >/= 5 degrees for functional ROM required for ADLs    Time 8    Period Weeks    Status New    Target Date 12/09/20      PT LONG TERM GOAL #2   Title increase gross bil LE strength to >/= 4/5 to promote posture, hip/ knee stability with walking/ standing    Time 8    Period Weeks    Status New    Target Date 12/09/20      PT LONG TERM GOAL #3   Title pt to be able to sit, stand and walk for >/= 30 min for in home / short community ambulation safely    Baseline -    Time 8    Period Weeks    Status New    Target Date 12/09/20      PT LONG TERM GOAL #4   Title increase FOTO score to >/= 48% limited to demo improvement in function    Time 8    Period Weeks    Status New    Target Date 12/09/20      PT LONG TERM GOAL #5   Title pt to be IND with advanced HEP and is able to maintain and progress current LOF IND.    Time 8    Period Weeks    Status New    Target Date  12/09/20                 Plan - 11/13/20 1630    Clinical Impression Statement pt reports no pain coming into her session to but notes taking pain medication 1 hour prior to session. cotninued focus on gross hip / knee strength increasing reps to promote endurance and strength. began PNE regarding pain doesn't mean harm, and benefits of graded exercise. she was able to perform and  complete all exercises today which where performed in sitting due to pt not being able to tolerate laying down.    PT Treatment/Interventions ADLs/Self Care Home Management;Electrical Stimulation;Iontophoresis 4mg /ml Dexamethasone;Moist Heat;Traction;Ultrasound;Gait training;Stair training;Functional mobility training;Therapeutic activities;Therapeutic exercise;Neuromuscular re-education;Manual techniques;Passive range of motion;Dry needling;Splinting;Patient/family education;Taping;Aquatic Therapy    PT Next Visit Plan review / update HEP PRN, STW for low back, gentle core/ hip strengthening, posture education, PNE info, doesn't tolerate laying down well, progress strengthieng between seated and CKC    PT Home Exercise Plan           Patient will benefit from skilled therapeutic intervention in order to improve the following deficits and impairments:  Increased muscle spasms,Improper body mechanics,Decreased strength,Abnormal gait,Postural dysfunction,Pain,Obesity,Decreased activity tolerance,Decreased endurance,Decreased range of motion  Visit Diagnosis: Chronic bilateral low back pain without sciatica  Muscle weakness (generalized)  Abnormal posture  Muscle spasm of back     Problem List Patient Active Problem List   Diagnosis Date Noted  . Aortic atherosclerosis (HCC) 11/05/2020  . Allergic rhinitis 11/22/2018  . External hemorrhoids 10/07/2018  . Chronic constipation 10/07/2018  . Morbid obesity (HCC) 04/01/2017  . PAD (peripheral artery disease) (HCC) 10/29/2016  . Chronic back pain 09/23/2016  . Diabetic peripheral neuropathy (HCC) 02/05/2016  . Uncontrolled diabetes mellitus (HCC) 06/17/2006  . Hyperlipidemia 06/17/2006  . Essential hypertension 06/17/2006  . Coronary atherosclerosis 06/17/2006  . DIASTOLIC DYSFUNCTION 06/17/2006  . DISEASE, CEREBROVASCULAR NEC 06/17/2006  . Osteoarthritis of both knees 06/17/2006    08/16/2006 PT, DPT, LAT, ATC   11/13/20  4:34 PM      [facility] Outpatient Rehabilitation [facility] [address] [place], [place], [place] Phone: [phone]   Fax:  [phone]  Name: Lindsay Hardy MRN: [name] Date of Birth: 12-29-1950

## 2020-11-20 ENCOUNTER — Ambulatory Visit: Payer: Medicare Other | Admitting: Physical Therapy

## 2020-11-23 DIAGNOSIS — G894 Chronic pain syndrome: Secondary | ICD-10-CM | POA: Diagnosis not present

## 2020-11-27 ENCOUNTER — Ambulatory Visit: Payer: Medicare Other | Admitting: Physical Therapy

## 2020-12-04 ENCOUNTER — Encounter: Payer: Self-pay | Admitting: Physical Therapy

## 2020-12-04 ENCOUNTER — Ambulatory Visit: Payer: Medicare Other | Admitting: Physical Therapy

## 2020-12-04 ENCOUNTER — Other Ambulatory Visit: Payer: Self-pay

## 2020-12-04 DIAGNOSIS — M6281 Muscle weakness (generalized): Secondary | ICD-10-CM

## 2020-12-04 DIAGNOSIS — M62830 Muscle spasm of back: Secondary | ICD-10-CM

## 2020-12-04 DIAGNOSIS — G8929 Other chronic pain: Secondary | ICD-10-CM | POA: Diagnosis not present

## 2020-12-04 DIAGNOSIS — R293 Abnormal posture: Secondary | ICD-10-CM | POA: Diagnosis not present

## 2020-12-04 DIAGNOSIS — M545 Low back pain, unspecified: Secondary | ICD-10-CM | POA: Diagnosis not present

## 2020-12-04 NOTE — Patient Instructions (Signed)
Access Code: [email or web address] URL: [email or web address] Date: 12/04/2020 Prepared by: Rosana Hoes  Exercises Seated Hamstring Stretch - 1 x daily - 7 x weekly - 2 reps - 30 hold Seated Long Arc Quad - 1 x daily - 7 x weekly - 2 sets - 20 reps Seated Hip Abduction with Resistance - 1 x daily - 7 x weekly - 2 sets - 20 reps Seated March with Resistance - 1 x daily - 7 x weekly - 2 sets - 20 reps Seated Abdominal Press into [employer] - 1 x daily - 7 x weekly - 2 sets - 10 reps - 5 hold Seated Pelvic Tilt - 1 x daily - 7 x weekly - 2 sets - 10 reps Sit to Stand - 1 x daily - 7 x weekly - 2 sets - 5 reps

## 2020-12-04 NOTE — Therapy (Signed)
[facility] Out[facility] Rehabilitation [facility] [address] [place], [place], [address] Phone: [phone]   Fax:  [phone]  Physical Therapy Treatment  Patient Details  Name: Lindsay Hardy MRN: [id number] Date of Birth: [birth year] Referring Provider (PT): Pincus Sanes, MD   Encounter Date: 12/04/2020   PT End of Session - 12/04/20 1536     Visit Number 4    Number of Visits 9    Date for PT Re-Evaluation 12/09/20    Authorization Type MCR: FOTO 6th and 10th visit    Progress Note Due on Visit 10    PT Start Time 1528    PT Stop Time 1610    PT Time Calculation (min) 42 min    Activity Tolerance Patient tolerated treatment well    Behavior During Therapy [facility] for tasks assessed/performed             Past Medical History:  Diagnosis Date   Asthma    CAD (coronary artery disease) 11/2003    RCA 60% stenosis, LAD diffuse   Chicken pox    CVA (cerebrovascular accident) (HCC) 2001    right frontoparietal cortical CVA - MRI September 2001   Diabetes mellitus    Diastolic dysfunction     ejection fraction 55-65% on 2-D echo May 2006   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)     based on MRI September 2001, related to obesity, hypertension, it was determined that patient has not required treatment but will be monitored with TSH, ACTH, cortisol, testosterone, prolactin, growth hormone   GERD (gastroesophageal reflux disease)     EGD June 2004 -  followed by Dr. Leone Payor   Granulomatous disease, chronic (HCC)     in right upper lobe per x-ray December 2005   Hyperlipidemia    Hypertension    NAFLD (nonalcoholic fatty liver disease)     based on ultrasound June 2002, elevated transaminase   OA (osteoarthritis)     DJP coracoclavicular ligament, left patella osteophytes June 2002, L4-S1 spondylosis, degenerative disc disease with bulge. I in October 2001, the C2-C4 spondylosis without stenosis and with spurs based on x-ray July 2005; diffuse idiopathic skeletal  hyperostosis with bilateral hip lumbar degenerative changes   on x-ray February 2005, bilateral plantar calcaneal spurs   Reactive airway disease     peak flow 180 in May 2005, chronic sinusitis with PND, bronchitis December 2002 and strep pneumonia April 2007,  FEC 66, MCV 60, ratio is 69, DLCO 57,  moderate restrictive and mild obstructive disease  on spirometry May 2005    Past Surgical History:  Procedure Laterality Date   CARPAL TUNNEL RELEASE  1992    left arm 1992   CHOLECYSTECTOMY  2003   TONSILLECTOMY AND ADENOIDECTOMY      There were no vitals filed for this visit.   Subjective Assessment - 12/04/20 1531     Subjective Patient reports she had to cancel last visit due to transportation. Currently she is having a lot of pain. Patient also reporting bilateral knee pain from previous fall, right worse than left.    Patient Stated Goals Get rid of back pain    Currently in Pain? Yes    Pain Score 7     Pain Location Back    Pain Orientation Lower    Pain Descriptors / Indicators Aching    Pain Type Chronic pain    Pain Onset More than a month ago    Pain Frequency Constant  [facility] PT Assessment - 12/04/20 0001       Functional Tests   Functional tests Sit to Stand      Sit to Stand   Comments Patient requires BUE support on thighs, requires multiple attempts with throwing trunk forward for momentum to stand from standard chair      Ambulation/Gait   Gait Comments Patient using SPC on left, decreased gait speed                           [facility] Adult PT Treatment/Exercise - 12/04/20 0001       Exercises   Exercises Knee/Hip      Lumbar Exercises: Stretches   Passive Hamstring Stretch 2 reps;20 seconds    Passive Hamstring Stretch Limitations seated edge of mat table    Pelvic Tilt 10 reps    Pelvic Tilt Limitations seated    Other Lumbar Stretch Exercise Seated lumbar flexion physioball roll out 10 x 5 sec fwd      Lumbar  Exercises: Aerobic   Nustep L4 x 6 min UE/LE while taking subjective      Lumbar Exercises: Seated   Long Arc Quad on Chair 2 sets;20 reps    LAQ on Chair Weights (lbs) 1    Sit to Stand 5 reps    Sit to Stand Limitations hands on thighs for support    Other Seated Lumbar Exercises Seated abd isometric with physioball 10 x 5 sec    Other Seated Lumbar Exercises Marching with yellow 2 x 20; Clamshell with yellow 2 x 20      Knee/Hip Exercises: Seated   Hamstring Curl 2 sets;15 reps    Hamstring Limitations yellow band                    PT Education - 12/04/20 1533     Education Details HEP update    Person(s) Educated Patient    Methods Explanation;Demonstration;Verbal cues;Handout    Comprehension Verbalized understanding;Returned demonstration;Verbal cues required;Need further instruction              PT Short Term Goals - 12/04/20 1544       PT SHORT TERM GOAL #1   Title pt to be IND with initial HEP    Baseline patient reports consistency with initial HEP    Time 4    Period Weeks    Status Achieved    Target Date 11/11/20      PT SHORT TERM GOAL #2   Title pt to report low back pain to </= 6/10 to allow for therapuetic progression    Baseline patient reports 7/10 this visit    Time 4    Period Weeks    Status On-going    Target Date 11/11/20      PT SHORT TERM GOAL #3   Title -    Baseline -               PT Long Term Goals - 10/14/20 1629       PT LONG TERM GOAL #1   Title increase trunk flexion to >/= 40 degrees and extension/ bil sidebending by >/= 5 degrees for functional ROM required for ADLs    Time 8    Period Weeks    Status New    Target Date 12/09/20      PT LONG TERM GOAL #2   Title increase gross bil LE strength to >/= 4/5 to  promote posture, hip/ knee stability with walking/ standing    Time 8    Period Weeks    Status New    Target Date 12/09/20      PT LONG TERM GOAL #3   Title pt to be able to sit, stand and  walk for >/= 30 min for in home / short community ambulation safely    Baseline -    Time 8    Period Weeks    Status New    Target Date 12/09/20      PT LONG TERM GOAL #4   Title increase FOTO score to >/= 48% limited to demo improvement in function    Time 8    Period Weeks    Status New    Target Date 12/09/20      PT LONG TERM GOAL #5   Title pt to be IND with advanced HEP and is able to maintain and progress current LOF IND.    Time 8    Period Weeks    Status New    Target Date 12/09/20                   Plan - 12/04/20 1539     Clinical Impression Statement Patient reports increase pain this visit with no apparent mechanism. Therapy consisted of progressing seated hip, core, and LE strength this visit. Patient continues to be unable to perform exercises on mat table due to pain. Added seated abdominal sets and sit<>stands to HEP to help improve function and reduce pain. Patient would benefit from continued skilled PT to progress mobility and strength in order to reduce pain and maximize her functional ability.    PT Treatment/Interventions ADLs/Self Care Home Management;Electrical Stimulation;Iontophoresis 4mg /ml Dexamethasone;Moist Heat;Traction;Ultrasound;Gait training;Stair training;Functional mobility training;Therapeutic activities;Therapeutic exercise;Neuromuscular re-education;Manual techniques;Passive range of motion;Dry needling;Splinting;Patient/family education;Taping;Aquatic Therapy    PT Next Visit Plan Perform re-cert, review / update HEP PRN, gentle core/ hip strengthening in seated and standing (she does not tolerate supine), posture education and strengthenng, PNE    PT Home Exercise Plan    Consulted and Agree with Plan of Care Patient             Patient will benefit from skilled therapeutic intervention in order to improve the following deficits and impairments:  Increased muscle spasms, Improper body mechanics, Decreased strength,  Abnormal gait, Postural dysfunction, Pain, Obesity, Decreased activity tolerance, Decreased endurance, Decreased range of motion  Visit Diagnosis: Chronic bilateral low back pain without sciatica  Muscle weakness (generalized)  Abnormal posture  Muscle spasm of back     Problem List Patient Active Problem List   Diagnosis Date Noted   Aortic atherosclerosis (HCC) 11/05/2020   Allergic rhinitis 11/22/2018   External hemorrhoids 10/07/2018   Chronic constipation 10/07/2018   Morbid obesity (HCC) 04/01/2017   PAD (peripheral artery disease) (HCC) 10/29/2016   Chronic back pain 09/23/2016   Diabetic peripheral neuropathy (HCC) 02/05/2016   Uncontrolled diabetes mellitus (HCC) 06/17/2006   Hyperlipidemia 06/17/2006   Essential hypertension 06/17/2006   Coronary atherosclerosis 06/17/2006   DIASTOLIC DYSFUNCTION 06/17/2006   DISEASE, CEREBROVASCULAR NEC 06/17/2006   Osteoarthritis of both knees 06/17/2006    08/16/2006, PT, DPT, LAT, ATC 12/04/20  4:17 PM Phone: [phone] Fax: [phone]   [facility] Outpatient Rehabilitation [facility] [address] [place], [place], [place] Phone: [phone]   Fax:  [phone]  Name: Lindsay Hardy MRN: [name] Date of Birth: [birth year]

## 2020-12-10 DIAGNOSIS — M5136 Other intervertebral disc degeneration, lumbar region: Secondary | ICD-10-CM | POA: Diagnosis not present

## 2020-12-10 DIAGNOSIS — M5431 Sciatica, right side: Secondary | ICD-10-CM | POA: Diagnosis not present

## 2020-12-10 DIAGNOSIS — G894 Chronic pain syndrome: Secondary | ICD-10-CM | POA: Diagnosis not present

## 2020-12-10 DIAGNOSIS — Z79891 Long term (current) use of opiate analgesic: Secondary | ICD-10-CM | POA: Diagnosis not present

## 2020-12-11 ENCOUNTER — Telehealth: Payer: Self-pay | Admitting: Physical Therapy

## 2020-12-11 ENCOUNTER — Ambulatory Visit: Payer: Medicare Other | Attending: Internal Medicine | Admitting: Physical Therapy

## 2020-12-11 DIAGNOSIS — R293 Abnormal posture: Secondary | ICD-10-CM | POA: Insufficient documentation

## 2020-12-11 DIAGNOSIS — M6281 Muscle weakness (generalized): Secondary | ICD-10-CM | POA: Insufficient documentation

## 2020-12-11 DIAGNOSIS — M545 Low back pain, unspecified: Secondary | ICD-10-CM | POA: Insufficient documentation

## 2020-12-11 DIAGNOSIS — M62830 Muscle spasm of back: Secondary | ICD-10-CM | POA: Insufficient documentation

## 2020-12-11 DIAGNOSIS — G8929 Other chronic pain: Secondary | ICD-10-CM | POA: Insufficient documentation

## 2020-12-11 NOTE — Telephone Encounter (Signed)
Contacted patient due to missed PT appointment. Patient stated she was not feeling well and forgot to call to cancel. Patient was reminded of her next scheduled appointment and advised of attendance policy. Patient expressed understanding.  Rosana Hoes, PT, DPT, LAT, ATC 12/11/20  5:16 PM Phone: [phone] Fax: [phone]

## 2020-12-18 ENCOUNTER — Other Ambulatory Visit: Payer: Self-pay

## 2020-12-18 ENCOUNTER — Ambulatory Visit: Payer: Medicare Other | Admitting: Physical Therapy

## 2020-12-18 VITALS — BP 155/94 | HR 120

## 2020-12-18 DIAGNOSIS — G8929 Other chronic pain: Secondary | ICD-10-CM | POA: Diagnosis not present

## 2020-12-18 DIAGNOSIS — M62830 Muscle spasm of back: Secondary | ICD-10-CM | POA: Diagnosis not present

## 2020-12-18 DIAGNOSIS — M545 Low back pain, unspecified: Secondary | ICD-10-CM | POA: Diagnosis not present

## 2020-12-18 DIAGNOSIS — M6281 Muscle weakness (generalized): Secondary | ICD-10-CM | POA: Diagnosis not present

## 2020-12-18 DIAGNOSIS — R293 Abnormal posture: Secondary | ICD-10-CM | POA: Diagnosis not present

## 2020-12-18 NOTE — Therapy (Signed)
[place], [place], [address] Phone: [phone]   Fax:  [phone]  Physical Therapy Treatment  Patient Details  Name: Lindsay Hardy MRN: [id number] Date of Birth: [birth year] Referring Provider (PT): Binnie Rail, MD   Encounter Date: 12/18/2020   PT End of Session - 12/18/20 1629     Visit Number 5    Number of Visits 9    Date for PT Re-Evaluation 12/18/20    Authorization Type MCR:  10th visit    Progress Note Due on Visit 10    PT Start Time 1630    PT Stop Time 1700    PT Time Calculation (min) 30 min    Activity Tolerance Patient tolerated treatment well    Behavior During Therapy [facility] for tasks assessed/performed             Past Medical History:  Diagnosis Date   Asthma    CAD (coronary artery disease) 11/2003    RCA 60% stenosis, LAD diffuse   Chicken pox    CVA (cerebrovascular accident) ([place]) 2001    right frontoparietal cortical CVA - MRI September 2001   Diabetes mellitus    Diastolic dysfunction     ejection fraction 55-65% on 2-D echo May 2006   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 Carlean Purl   Granulomatous disease, chronic ([place])     in right upper lobe per x-ray December 2005   Hyperlipidemia    Hypertension    NAFLD (nonalcoholic fatty liver disease)     based on ultrasound June 2002, elevated transaminase   OA (osteoarthritis)     DJP coracoclavicular ligament, left patella osteophytes June 2002, L4-S1 spondylosis, degenerative disc disease with bulge. I in October 2001, the C2-C4 spondylosis without stenosis and with spurs based on x-ray July 2005; diffuse idiopathic skeletal hyperostosis with  bilateral hip lumbar degenerative changes   on x-ray February 2005, bilateral plantar calcaneal spurs   Reactive airway disease     peak flow 180 in May 2005, chronic sinusitis with PND, bronchitis December 2002 and strep pneumonia April 2007,  FEC 66, MCV 60, ratio is 69, DLCO 57,  moderate restrictive and mild obstructive disease  on spirometry May 2005    Past Surgical History:  Procedure Laterality Date   CARPAL TUNNEL RELEASE  1992    left arm 1992   CHOLECYSTECTOMY  2003   TONSILLECTOMY AND ADENOIDECTOMY      Vitals:   12/18/20 1633  BP: (!) 155/94  Pulse: (!) 120  SpO2: 96%     Subjective Assessment - 12/18/20 1633     Subjective " I am doing okay. I am have a rough day today, my blood sugar is up today and yesterday, I checked at lunch and my blood sugar was 238. the humidity has me out of breath not to mention the pain which is about an 8/10. any time I get up and stand it feels like someone is stabbing me in my back."    Patient Stated Goals Get rid of back pain    Currently in Pain? Yes    Pain Score 8     Pain Location Back  Pain Descriptors / Indicators Aching    Pain Type Chronic pain    Pain Onset More than a month ago    Pain Frequency Constant    Aggravating Factors  any acitivty                [facility] PT Assessment - 12/18/20 0001       Assessment   Medical Diagnosis Chronic midline low back pain with bilateral sciatica    Referring Provider (PT) Burns, Stacy J, MD      AROM   Overall AROM  Unable to assess;Due to pain      Strength   Right Hip ABduction 3/5    Left Hip Internal Rotation 3/5    Right Knee Flexion 4-/5    Right Knee Extension 4-/5    Left Knee Flexion 4-/5    Left Knee Extension 4-/5                           [facility] Adult PT Treatment/Exercise - 12/18/20 0001       Lumbar Exercises: Seated   Long Arc Quad on Chair Both;1 set;10 reps   with quad set x 1 sec   Other Seated Lumbar Exercises Marching with  green 1 x 10; Clamshell with green 1x 20                    PT Education - 12/18/20 1714     Education Details extensively reviewed HEP, avoid holding her breath while she is walking or doing exercises.    Person(s) Educated Patient    Methods Explanation;Verbal cues;Handout    Comprehension Verbalized understanding;Verbal cues required              PT Short Term Goals - 12/18/20 1638       PT SHORT TERM GOAL #1   Title pt to be IND with initial HEP    Status Achieved      PT SHORT TERM GOAL #2   Title pt to report low back pain to </= 6/10 to allow for therapuetic progression    Status Not Met               PT Long Term Goals - 12/18/20 1638       PT LONG TERM GOAL #1   Title increase trunk flexion to >/= 40 degrees and extension/ bil sidebending by >/= 5 degrees for functional ROM required for ADLs    Period Weeks    Status Not Met      PT LONG TERM GOAL #2   Title increase gross bil LE strength to >/= 4/5 to promote posture, hip/ knee stability with walking/ standing    Period Weeks    Status Not Met      PT LONG TERM GOAL #3   Title pt to be able to sit, stand and walk for >/= 30 min for in home / short community ambulation safely    Period Weeks    Status Not Met      PT LONG TERM GOAL #4   Title increase FOTO score to >/= 48% limited to demo improvement in function      PT LONG TERM GOAL #5   Title pt to be IND with advanced HEP and is able to maintain and progress current LOF IND.    Period Weeks    Status Not Met                     Plan - 12/18/20 1710     Clinical Impression Statement Lindsay Hardy arrives to PT today with continued high level of pain and soreness along the back rated at 8/10 today. She also noted having elevated blood sugar today, and presented with dyspnea. vitals were assessed today which BP was 155/94, HR 120, and O2 96% on room air. Her FOTO score dropped from 32% to 24%, her strength as regressed compared to  initial assessment. Based on current LOF, continued elevated pain level and lack of functional progression plan to discharge from physical therapy today. She would benefit following with with her referring provider for further assessment.    PT Treatment/Interventions ADLs/Self Care Home Management;Electrical Stimulation;Iontophoresis 42m/ml Dexamethasone;Moist Heat;Traction;Ultrasound;Gait training;Stair training;Functional mobility training;Therapeutic activities;Therapeutic exercise;Neuromuscular re-education;Manual techniques;Passive range of motion;Dry needling;Splinting;Patient/family education;Taping;Aquatic Therapy    PT Next Visit Plan Perform re-cert, review / update HEP PRN, gentle core/ hip strengthening in seated and standing (she does not tolerate supine), posture education and strengthenng, PNE    Consulted and Agree with Plan of Care Patient             Patient will benefit from skilled therapeutic intervention in order to improve the following deficits and impairments:  Increased muscle spasms, Improper body mechanics, Decreased strength, Abnormal gait, Postural dysfunction, Pain, Obesity, Decreased activity tolerance, Decreased endurance, Decreased range of motion  Visit Diagnosis: Chronic bilateral low back pain without sciatica  Muscle weakness (generalized)  Abnormal posture  Muscle spasm of back     Problem List Patient Active Problem List   Diagnosis Date Noted   Aortic atherosclerosis ([place] 11/05/2020   Allergic rhinitis 11/22/2018   External hemorrhoids 10/07/2018   Chronic constipation 10/07/2018   Morbid obesity ([place] 04/01/2017   PAD (peripheral artery disease) ([place] 10/29/2016   Chronic back pain 09/23/2016   Diabetic peripheral neuropathy ([place] 02/05/2016   Uncontrolled diabetes mellitus ([place] 06/17/2006   Hyperlipidemia 06/17/2006   Essential hypertension 06/17/2006   Coronary atherosclerosis 044/31/5400  DIASTOLIC DYSFUNCTION 086/76/1950   DISEASE, CEREBROVASCULAR NEC 06/17/2006   Osteoarthritis of both knees 06/17/2006    LStarr Lake7/13/2022, 5:17 PM  [facility] [address]Phone: [phone]  Fax:  [phone] Name: SVestal CrandallMRN: [id number] of Birth: 1August 23, 1952     PHYSICAL THERAPY DISCHARGE SUMMARY  Visits from Start of Care: 5  Current functional level related to goals / functional outcomes: See goals, FOTO 24%   Remaining deficits: See assessment   Education / Equipment: HEP, theraband   Patient agrees to discharge. Patient goals were not met. Patient is being discharged due to lack of progress.  Rjay Revolorio PT, DPT, LAT, ATC  12/18/20  5:18 PM

## 2020-12-23 DIAGNOSIS — M5136 Other intervertebral disc degeneration, lumbar region: Secondary | ICD-10-CM | POA: Diagnosis not present

## 2020-12-23 DIAGNOSIS — G894 Chronic pain syndrome: Secondary | ICD-10-CM | POA: Diagnosis not present

## 2021-01-06 DIAGNOSIS — G894 Chronic pain syndrome: Secondary | ICD-10-CM | POA: Diagnosis not present

## 2021-01-06 DIAGNOSIS — M5136 Other intervertebral disc degeneration, lumbar region: Secondary | ICD-10-CM | POA: Diagnosis not present

## 2021-01-06 DIAGNOSIS — Z79891 Long term (current) use of opiate analgesic: Secondary | ICD-10-CM | POA: Diagnosis not present

## 2021-01-13 ENCOUNTER — Telehealth (INDEPENDENT_AMBULATORY_CARE_PROVIDER_SITE_OTHER): Payer: Medicare Other | Admitting: Internal Medicine

## 2021-01-13 ENCOUNTER — Telehealth: Payer: Self-pay | Admitting: Internal Medicine

## 2021-01-13 ENCOUNTER — Encounter: Payer: Self-pay | Admitting: Internal Medicine

## 2021-01-13 DIAGNOSIS — J069 Acute upper respiratory infection, unspecified: Secondary | ICD-10-CM | POA: Diagnosis not present

## 2021-01-13 MED ORDER — AZITHROMYCIN 250 MG PO TABS: ORAL_TABLET | ORAL | 0 refills | Status: DC

## 2021-01-13 NOTE — Telephone Encounter (Signed)
Z pak sent to pharmacy

## 2021-01-13 NOTE — Telephone Encounter (Signed)
Patient calling to report negative covid result; home test 01/13/21

## 2021-01-13 NOTE — Assessment & Plan Note (Signed)
Acute Today is day 4 of symptoms symptoms do sound like covid --   covid test today at home is negative  Will start zpak Albuterol inhaler prn Allergy medication otc cold meds prn Rest, fluids Call if no improvement

## 2021-01-13 NOTE — Progress Notes (Signed)
Virtual Visit via Video Note  I connected with Lindsay Hardy on 01/13/21 at  1:40 PM EDT by a video enabled telemedicine application and verified that I am speaking with the correct person using two identifiers.   I discussed the limitations of evaluation and management by telemedicine and the availability of in person appointments. The patient expressed understanding and agreed to proceed.  Present for the visit:  Myself, Dr Cheryll Cockayne, [name].  The patient is currently at home and I am in the office.    No referring provider.    History of Present Illness: She is here for an acute visit for cold symptoms.  Symptoms started Friday, today is day 4 of symptoms   She is experiencing fatigue, cough, congestion, sinus pain, sore throat, , PND, runny nose, headache, dizziness and body aches.   She took mucinex and her allergy medication.  She is fully vaccinated.    home covid test today is negative.     Review of Systems  Constitutional:  Positive for malaise/fatigue. Negative for fever.  HENT:  Positive for congestion, sinus pain and sore throat. Negative for ear pain.        Rhinorrhea, sneezing, PND  Respiratory:  Positive for cough and sputum production (occasional). Negative for shortness of breath and wheezing.   Gastrointestinal:  Negative for diarrhea and nausea.  Musculoskeletal:  Positive for myalgias.  Neurological:  Positive for dizziness and headaches.     Social History   Socioeconomic History   Marital status: Single    Spouse name: Not on file   Number of children: 1   Years of education: 14   Highest education level: Not on file  Occupational History   Occupation:  former Agricultural engineer  Tobacco Use   Smoking status: Former    Packs/day: 0.25    Years: 4.00    Pack years: 1.00    Types: Cigarettes   Smokeless tobacco: Never   Tobacco comments:    smoked in 20's   Vaping Use   Vaping Use: Never used  Substance and Sexual Activity    Alcohol use: No    Alcohol/week: 0.0 standard drinks   Drug use: No   Sexual activity: Not Currently    Birth control/protection: Post-menopausal  Other Topics Concern   Not on file  Social History Narrative   Born and raised in [place], [place]. Currently lives in a private residence with her nephew and his wife. Fun: Church activities, spending time with family   Denies any religious beliefs that would effect health care.    Social Determinants of Health   Financial Resource Strain: Not on file  Food Insecurity: Not on file  Transportation Needs: Not on file  Physical Activity: Not on file  Stress: Not on file  Social Connections: Not on file     Observations/Objective: Appears well in NAD Breathing normally  Assessment and Plan:  See Problem List for Assessment and Plan of chronic medical problems.   Follow Up Instructions:    I discussed the assessment and treatment plan with the patient. The patient was provided an opportunity to ask questions and all were answered. The patient agreed with the plan and demonstrated an understanding of the instructions.   The patient was advised to call back or seek an in-person evaluation if the symptoms worsen or if the condition fails to improve as anticipated.    Pincus Sanes, MD

## 2021-01-14 ENCOUNTER — Telehealth: Payer: Medicare Other | Admitting: Internal Medicine

## 2021-01-14 NOTE — Telephone Encounter (Signed)
Spoke with patient yesterday.

## 2021-01-22 DIAGNOSIS — M545 Low back pain, unspecified: Secondary | ICD-10-CM | POA: Diagnosis not present

## 2021-01-22 DIAGNOSIS — G894 Chronic pain syndrome: Secondary | ICD-10-CM | POA: Diagnosis not present

## 2021-01-28 ENCOUNTER — Other Ambulatory Visit: Payer: Self-pay | Admitting: Internal Medicine

## 2021-02-03 DIAGNOSIS — G894 Chronic pain syndrome: Secondary | ICD-10-CM | POA: Diagnosis not present

## 2021-02-03 DIAGNOSIS — M5432 Sciatica, left side: Secondary | ICD-10-CM | POA: Diagnosis not present

## 2021-02-03 DIAGNOSIS — M5431 Sciatica, right side: Secondary | ICD-10-CM | POA: Diagnosis not present

## 2021-02-03 DIAGNOSIS — M5136 Other intervertebral disc degeneration, lumbar region: Secondary | ICD-10-CM | POA: Diagnosis not present

## 2021-02-07 ENCOUNTER — Other Ambulatory Visit: Payer: Self-pay | Admitting: Internal Medicine

## 2021-02-20 DIAGNOSIS — H53462 Homonymous bilateral field defects, left side: Secondary | ICD-10-CM | POA: Diagnosis not present

## 2021-02-20 DIAGNOSIS — H40023 Open angle with borderline findings, high risk, bilateral: Secondary | ICD-10-CM | POA: Diagnosis not present

## 2021-02-21 DIAGNOSIS — G894 Chronic pain syndrome: Secondary | ICD-10-CM | POA: Diagnosis not present

## 2021-02-21 DIAGNOSIS — M545 Low back pain, unspecified: Secondary | ICD-10-CM | POA: Diagnosis not present

## 2021-02-25 ENCOUNTER — Other Ambulatory Visit: Payer: Self-pay | Admitting: Internal Medicine

## 2021-03-03 DIAGNOSIS — M179 Osteoarthritis of knee, unspecified: Secondary | ICD-10-CM | POA: Diagnosis not present

## 2021-03-03 DIAGNOSIS — I119 Hypertensive heart disease without heart failure: Secondary | ICD-10-CM | POA: Diagnosis not present

## 2021-03-03 DIAGNOSIS — M47897 Other spondylosis, lumbosacral region: Secondary | ICD-10-CM | POA: Diagnosis not present

## 2021-03-03 DIAGNOSIS — G8929 Other chronic pain: Secondary | ICD-10-CM | POA: Diagnosis not present

## 2021-03-03 DIAGNOSIS — M48062 Spinal stenosis, lumbar region with neurogenic claudication: Secondary | ICD-10-CM | POA: Diagnosis not present

## 2021-03-03 DIAGNOSIS — Z79891 Long term (current) use of opiate analgesic: Secondary | ICD-10-CM | POA: Diagnosis not present

## 2021-03-03 DIAGNOSIS — G894 Chronic pain syndrome: Secondary | ICD-10-CM | POA: Diagnosis not present

## 2021-03-03 DIAGNOSIS — R5382 Chronic fatigue, unspecified: Secondary | ICD-10-CM | POA: Diagnosis not present

## 2021-03-03 DIAGNOSIS — M792 Neuralgia and neuritis, unspecified: Secondary | ICD-10-CM | POA: Diagnosis not present

## 2021-03-03 DIAGNOSIS — M5136 Other intervertebral disc degeneration, lumbar region: Secondary | ICD-10-CM | POA: Diagnosis not present

## 2021-03-17 ENCOUNTER — Other Ambulatory Visit: Payer: Self-pay

## 2021-03-17 ENCOUNTER — Telehealth: Payer: Self-pay

## 2021-03-17 NOTE — Telephone Encounter (Signed)
Please advise as Carney Bern from [facility] has called to inquire about pts paperwork needed to fill request for Dexacom glucose monitor. She states she needs papers signed a/w last office visit notes faxed to [phone].  Carney Bern can be reached at [phone].

## 2021-03-18 NOTE — Telephone Encounter (Signed)
Form received today. Will complete this week and fax back.

## 2021-03-20 NOTE — Telephone Encounter (Signed)
Paperwork faxed today.

## 2021-03-23 DIAGNOSIS — M545 Low back pain, unspecified: Secondary | ICD-10-CM | POA: Diagnosis not present

## 2021-03-23 DIAGNOSIS — G894 Chronic pain syndrome: Secondary | ICD-10-CM | POA: Diagnosis not present

## 2021-03-27 DIAGNOSIS — H16223 Keratoconjunctivitis sicca, not specified as Sjogren's, bilateral: Secondary | ICD-10-CM | POA: Diagnosis not present

## 2021-03-27 DIAGNOSIS — Z961 Presence of intraocular lens: Secondary | ICD-10-CM | POA: Diagnosis not present

## 2021-03-27 DIAGNOSIS — H53462 Homonymous bilateral field defects, left side: Secondary | ICD-10-CM | POA: Diagnosis not present

## 2021-03-27 DIAGNOSIS — H40023 Open angle with borderline findings, high risk, bilateral: Secondary | ICD-10-CM | POA: Diagnosis not present

## 2021-03-31 DIAGNOSIS — M170 Bilateral primary osteoarthritis of knee: Secondary | ICD-10-CM | POA: Diagnosis not present

## 2021-03-31 DIAGNOSIS — G894 Chronic pain syndrome: Secondary | ICD-10-CM | POA: Diagnosis not present

## 2021-03-31 DIAGNOSIS — M5431 Sciatica, right side: Secondary | ICD-10-CM | POA: Diagnosis not present

## 2021-03-31 DIAGNOSIS — M5136 Other intervertebral disc degeneration, lumbar region: Secondary | ICD-10-CM | POA: Diagnosis not present

## 2021-04-01 NOTE — Progress Notes (Signed)
Subjective:    Patient ID: Lindsay Hardy, female    DOB: 1950/09/27, 70 y.o.   MRN: [id number]  This visit occurred during the SARS-CoV-2 public health emergency.  Safety protocols were in place, including screening questions prior to the visit, additional usage of staff PPE, and extensive cleaning of exam room while observing appropriate contact time as indicated for disinfecting solutions.     HPI The patient is here for follow up of their chronic medical problems, including htn, DM with neuropathy, hld, chronic constipation  Left lower leg swelling - swelling twice in the past 1 week.  It is not persistent.  She has had swelling in this leg intermittently for much longer than that.  Left leg has always been bigger and weaker.  She does have left knee arthritis.  Her left leg is dark and scaly which started after a pedicure and hot rocks were put on her leg.  She thinks this is improving.  DM  - sugars at home 121-329, mostly in 200's.  She thinks her diet is part of the problem.  She does eat a lot of carbohydrates.  She does not always eat properly.  Medications and allergies reviewed with patient and updated if appropriate.  Patient Active Problem List   Diagnosis Date Noted   URI (upper respiratory infection) 01/13/2021   Aortic atherosclerosis ([place]) 11/05/2020   Allergic rhinitis 11/22/2018   External hemorrhoids 10/07/2018   Chronic constipation 10/07/2018   Morbid obesity ([place]) 04/01/2017   PAD (peripheral artery disease) ([place]) 10/29/2016   Chronic back pain 09/23/2016   Diabetic peripheral neuropathy ([place]) 02/05/2016   Diabetes ([place]) 06/17/2006   Hyperlipidemia 06/17/2006   Essential hypertension 06/17/2006   Coronary atherosclerosis 14/43/1540   DIASTOLIC DYSFUNCTION 08/67/6195   DISEASE, CEREBROVASCULAR NEC 06/17/2006   Osteoarthritis of both knees 06/17/2006    Current Outpatient Medications on File Prior to Visit  Medication Sig Dispense Refill   ACCU-CHEK  GUIDE test strip USE UP TO FOUR TIMES DAILY AS DIRECTED 100 strip 5   Accu-Chek Softclix Lancets lancets USE UP TO FOUR TIMES DAILY AS DIRECTED 100 each 1   albuterol (VENTOLIN HFA) 108 (90 Base) MCG/ACT inhaler INHALE 2 PUFFS INTO THE LUNGS EVERY 4 (FOUR) HOURS AS NEEDED FOR SHORTNESS OF BREATH. 6.7 each 11   amLODipine (NORVASC) 10 MG tablet TAKE 1 TABLET BY MOUTH EVERY DAY 90 tablet 1   aspirin 81 MG EC tablet TAKE 1 TABLET (81 MG TOTAL) BY MOUTH DAILY. 90 tablet 1   Blood Glucose Monitoring Suppl (ONETOUCH VERIO IQ SYSTEM) w/Device KIT Use to check blood sugar once to twice daily. 1 kit 0   diclofenac Sodium (VOLTAREN) 1 % GEL Apply 4 g topically 4 (four) times daily. 350 g 5   fluticasone (FLONASE) 50 MCG/ACT nasal spray Place 2 sprays into both nostrils daily. 16 g 6   glipiZIDE (GLUCOTROL) 10 MG tablet TAKE 1 TABLET BY MOUTH 2 TIMES DAILY BEFORE A MEAL. 180 tablet 1   hydrochlorothiazide (HYDRODIURIL) 25 MG tablet TAKE 1 TABLET (25 MG TOTAL) BY MOUTH DAILY. 90 tablet 1   HYDROcodone-acetaminophen (NORCO) 10-325 MG tablet SMARTSIG:1 Tablet(s) By Mouth 4-6 Times Daily     ibuprofen (ADVIL) 800 MG tablet TAKE 1 TABLET (800 MG TOTAL) BY MOUTH 2 (TWO) TIMES DAILY AS NEEDED FOR MODERATE PAIN. 180 tablet 0   linaclotide (LINZESS) 72 MCG capsule Take 1 capsule (72 mcg total) by mouth daily before breakfast. 90 capsule 1  metFORMIN (GLUCOPHAGE-XR) 500 MG 24 hr tablet TAKE 2 TABLETS BY MOUTH 2 TIMES DAILY BEFORE A MEAL. 360 tablet 1   Misc. Devices (BARIATRIC ROLLATOR) MISC Use as directed.  Dx  M54.9 1 each 0   montelukast (SINGULAIR) 10 MG tablet TAKE 1 TABLET BY MOUTH EVERYDAY AT BEDTIME 90 tablet 1   NARCAN 4 MG/0.1ML LIQD nasal spray kit 1 spray once.     potassium chloride (MICRO-K) 10 MEQ CR capsule TAKE 1 CAPSULE BY MOUTH TWICE A DAY 180 capsule 1   RESTASIS 0.05 % ophthalmic emulsion      traMADol (ULTRAM) 50 MG tablet PLEASE SEE ATTACHED FOR DETAILED DIRECTIONS     No current  facility-administered medications on file prior to visit.    Past Medical History:  Diagnosis Date   Asthma    CAD (coronary artery disease) 11/2003    RCA 60% stenosis, LAD diffuse   Chicken pox    CVA (cerebrovascular accident) ([place]) 2001    right frontoparietal cortical CVA - MRI September 2001   Diabetes mellitus    Diastolic dysfunction     ejection fraction 55-65% on 2-D echo May 2006   Empty sella syndrome [facility])     based on MRI September 2001, related to obesity, hypertension, it was determined that patient has not required treatment but will be monitored with TSH, ACTH, cortisol, testosterone, prolactin, growth hormone   GERD (gastroesophageal reflux disease)     EGD June 2004 -  followed by Dr. Carlean Purl   Granulomatous disease, chronic ([place])     in right upper lobe per x-ray December 2005   Hyperlipidemia    Hypertension    NAFLD (nonalcoholic fatty liver disease)     based on ultrasound June 2002, elevated transaminase   OA (osteoarthritis)     DJP coracoclavicular ligament, left patella osteophytes June 2002, L4-S1 spondylosis, degenerative disc disease with bulge. I in October 2001, the C2-C4 spondylosis without stenosis and with spurs based on x-ray July 2005; diffuse idiopathic skeletal hyperostosis with bilateral hip lumbar degenerative changes   on x-ray February 2005, bilateral plantar calcaneal spurs   Reactive airway disease     peak flow 180 in May 2005, chronic sinusitis with PND, bronchitis December 2002 and strep pneumonia April 2007,  FEC 66, MCV 60, ratio is 69, DLCO 57,  moderate restrictive and mild obstructive disease  on spirometry May 2005    Past Surgical History:  Procedure Laterality Date   CARPAL [place]    left arm 1992   CHOLECYSTECTOMY  2003   TONSILLECTOMY AND ADENOIDECTOMY      Social History   Socioeconomic History   Marital status: Single    Spouse name: Not on file   Number of children: 1   Years of education: 14    Highest education level: Not on file  Occupational History   Occupation:  former Psychologist, counselling  Tobacco Use   Smoking status: Former    Packs/day: 0.25    Years: 4.00    Pack years: 1.00    Types: Cigarettes   Smokeless tobacco: Never   Tobacco comments:    smoked in 20's   Vaping Use   Vaping Use: Never used  Substance and Sexual Activity   Alcohol use: No    Alcohol/week: 0.0 standard drinks   Drug use: No   Sexual activity: Not Currently    Birth control/protection: Post-menopausal  Other Topics Concern   Not on file  Social History Narrative  Born and raised in [place], [place]. Currently lives in a private residence with her nephew and his wife. Fun: Church activities, spending time with family   Denies any religious beliefs that would effect health care.    Social Determinants of Health   Financial Resource Strain: Not on file  Food Insecurity: Not on file  Transportation Needs: Not on file  Physical Activity: Not on file  Stress: Not on file  Social Connections: Not on file    Family History  Problem Relation Age of Onset   Lung cancer Mother    Hypertension Mother    Lung cancer Sister    Hypertension Sister     Review of Systems  Constitutional:  Negative for chills and fever.  Respiratory:  Positive for wheezing. Negative for cough and shortness of breath.   Cardiovascular:  Positive for leg swelling (left leg intermittent). Negative for chest pain and palpitations (with moderate exertion).  Neurological:  Negative for light-headedness and headaches.      Objective:   Vitals:   04/02/21 1510  BP: (!) 144/90  Pulse: (!) 106  Temp: 98.6 F (37 C)  SpO2: 97%   BP Readings from Last 3 Encounters:  04/02/21 (!) 144/90  12/18/20 (!) 155/94  08/06/20 (!) 148/86   Wt Readings from Last 3 Encounters:  04/02/21 279 lb (126.6 kg)  08/06/20 274 lb (124.3 kg)  12/29/19 (!) 270 lb (122.5 kg)   Body mass index is 46.43 kg/m.   Depression screen  [facility] 2/9 04/02/2021 11/01/2019 02/01/2018 04/01/2017 02/17/2017  Decreased Interest 0 0 2 0 0  Down, Depressed, Hopeless 0 0 2 0 0  PHQ - 2 Score 0 0 4 0 0  Altered sleeping 0 - 0 - -  Tired, decreased energy 0 - 3 - -  Change in appetite 0 - 2 - -  Feeling bad or failure about yourself  0 - 2 - -  Trouble concentrating 0 - 0 - -  Moving slowly or fidgety/restless 0 - 0 - -  Suicidal thoughts 0 - 1 - -  PHQ-9 Score 0 - 12 - -  Difficult doing work/chores - - Somewhat difficult - -  Some recent data might be hidden    GAD 7 : Generalized Anxiety Score 04/02/2021  Nervous, Anxious, on Edge 0  Control/stop worrying 0  Worry too much - different things 0  Trouble relaxing 0  Restless 0  Easily annoyed or irritable 0  Afraid - awful might happen 0  Total GAD 7 Score 0      Physical Exam    Constitutional: Appears well-developed and well-nourished. No distress.  HENT:  Head: Normocephalic and atraumatic.  Neck: Neck supple. No tracheal deviation present. No thyromegaly present.  No cervical lymphadenopathy Cardiovascular: Normal rate, regular rhythm and normal heart sounds.   No murmur heard. No carotid bruit .  Mild left lower extremity edema-left leg is larger than right leg Pulmonary/Chest: Effort normal and breath sounds normal. No respiratory distress. No has no wheezes. No rales.  Skin: Skin is warm and dry. Not diaphoretic.  Bilateral lower leg dryness, much worse on left lower leg.  hyperpigmentation left lower leg-no tenderness to palpation Psychiatric: Normal mood and affect. Behavior is normal.      Assessment & Plan:    Screened for depression using the PHQ 9 scale.  No evidence of depression.   Screened for anxiety using GAD7 Scale.  No evidence of anxiety.    See Problem List  for Assessment and Plan of chronic medical problems.

## 2021-04-02 ENCOUNTER — Ambulatory Visit (INDEPENDENT_AMBULATORY_CARE_PROVIDER_SITE_OTHER): Payer: Medicare Other | Admitting: Internal Medicine

## 2021-04-02 ENCOUNTER — Encounter: Payer: Self-pay | Admitting: Internal Medicine

## 2021-04-02 ENCOUNTER — Other Ambulatory Visit: Payer: Self-pay

## 2021-04-02 VITALS — BP 144/90 | HR 106 | Temp 98.6°F | Ht 65.0 in | Wt 279.0 lb

## 2021-04-02 DIAGNOSIS — K5909 Other constipation: Secondary | ICD-10-CM

## 2021-04-02 DIAGNOSIS — E782 Mixed hyperlipidemia: Secondary | ICD-10-CM | POA: Diagnosis not present

## 2021-04-02 DIAGNOSIS — I10 Essential (primary) hypertension: Secondary | ICD-10-CM

## 2021-04-02 DIAGNOSIS — E1142 Type 2 diabetes mellitus with diabetic polyneuropathy: Secondary | ICD-10-CM

## 2021-04-02 DIAGNOSIS — Z1331 Encounter for screening for depression: Secondary | ICD-10-CM

## 2021-04-02 DIAGNOSIS — E1165 Type 2 diabetes mellitus with hyperglycemia: Secondary | ICD-10-CM

## 2021-04-02 LAB — LIPID PANEL
Cholesterol: 170 mg/dL (ref 0–200)
HDL: 44.1 mg/dL (ref >39.00)
LDL Cholesterol: 108 mg/dL — ABNORMAL HIGH (ref 0–99)
NonHDL: 126.15
Total CHOL/HDL Ratio: 4
Triglycerides: 93 mg/dL (ref 0.0–149.0)
VLDL: 18.6 mg/dL (ref 0.0–40.0)

## 2021-04-02 LAB — CBC WITH DIFFERENTIAL/PLATELET
Basophils Absolute: 0 10*3/uL (ref 0.0–0.1)
Basophils Relative: 0.6 % (ref 0.0–3.0)
Eosinophils Absolute: 0.1 10*3/uL (ref 0.0–0.7)
Eosinophils Relative: 1.1 % (ref 0.0–5.0)
HCT: 42.1 % (ref 36.0–46.0)
Hemoglobin: 13.4 g/dL (ref 12.0–15.0)
Lymphocytes Relative: 30.6 % (ref 12.0–46.0)
Lymphs Abs: 2.1 10*3/uL (ref 0.7–4.0)
MCHC: 32 g/dL (ref 30.0–36.0)
MCV: 79.3 fl (ref 78.0–100.0)
Monocytes Absolute: 0.6 10*3/uL (ref 0.1–1.0)
Monocytes Relative: 8 % (ref 3.0–12.0)
Neutro Abs: 4.1 10*3/uL (ref 1.4–7.7)
Neutrophils Relative %: 59.7 % (ref 43.0–77.0)
Platelets: 307 10*3/uL (ref 150.0–400.0)
RBC: 5.3 Mil/uL — ABNORMAL HIGH (ref 3.87–5.11)
RDW: 14.9 % (ref 11.5–15.5)
WBC: 6.9 10*3/uL (ref 4.0–10.5)

## 2021-04-02 LAB — HEMOGLOBIN A1C: Hgb A1c MFr Bld: 11.2 % — ABNORMAL HIGH (ref 4.6–6.5)

## 2021-04-02 LAB — COMPREHENSIVE METABOLIC PANEL
ALT: 20 U/L (ref 0–35)
AST: 18 U/L (ref 0–37)
Albumin: 4.3 g/dL (ref 3.5–5.2)
Alkaline Phosphatase: 74 U/L (ref 39–117)
BUN: 11 mg/dL (ref 6–23)
CO2: 31 mEq/L (ref 19–32)
Calcium: 9.6 mg/dL (ref 8.4–10.5)
Chloride: 97 mEq/L (ref 96–112)
Creatinine, Ser: 0.76 mg/dL (ref 0.40–1.20)
GFR: 78.6 mL/min (ref >60.00)
Glucose, Bld: 209 mg/dL — ABNORMAL HIGH (ref 70–99)
Potassium: 3.3 mEq/L — ABNORMAL LOW (ref 3.5–5.1)
Sodium: 137 mEq/L (ref 135–145)
Total Bilirubin: 0.5 mg/dL (ref 0.2–1.2)
Total Protein: 8 g/dL (ref 6.0–8.3)

## 2021-04-02 MED ORDER — GLIPIZIDE 10 MG PO TABS: 20.0000 mg | ORAL_TABLET | Freq: Two times a day (BID) | ORAL | 1 refills | Status: DC

## 2021-04-02 MED ORDER — TELMISARTAN 40 MG PO TABS: 40.0000 mg | ORAL_TABLET | Freq: Every day | ORAL | 1 refills | Status: DC

## 2021-04-02 MED ORDER — RYBELSUS 3 MG PO TABS: 3.0000 mg | ORAL_TABLET | Freq: Every day | ORAL | 3 refills | Status: DC

## 2021-04-02 NOTE — Assessment & Plan Note (Signed)
Chronic Blood pressure not controlled CMP Continue hydrochlorothiazide 25 mg daily, amlodipine 10 mg daily Start telmisartan 40 mg daily

## 2021-04-02 NOTE — Assessment & Plan Note (Signed)
Chronic Improved Linzess is helping Continue Linzess 72 mcg daily as needed

## 2021-04-02 NOTE — Assessment & Plan Note (Signed)
Chronic Very poorly controlled She refuses to go on insulin Did not tolerate Ozempic.  Refuses to go on Jardiance/Farxiga Continue metformin XR 1000 mg twice daily Increase glipizide to 20 mg twice daily-discussed possible risk of hypoglycemia Start Rybelsus 3 mg daily-hopefully she will tolerate this despite not tolerating Ozempic and hopefully it is covered Working on getting her Dexcom Discussed her diet at length-she understands that the carbohydrates she eating is most of the problem for her sugars-referred to diabetic nutrition/education Check A1c Follow-up in 3 months

## 2021-04-02 NOTE — Assessment & Plan Note (Signed)
Chronic Regular exercise and healthy diet encouraged-encouraged her to work on diet and weight loss Check lipid panel  She needs to be on a statin, but has tried to and has not tolerated them with nonspecific side effects.  I do think she could probably tolerate a statin, but at this point and need to focus on her blood pressure and getting her sugars better controlled Will consider retrying low-dose statin at her next visit

## 2021-04-02 NOTE — Patient Instructions (Addendum)
     Blood work was ordered.     Medications changes include :   start rybelsus 3 mg daily if covered by insurance.   Increase glipizide to 20 mg twice a day.   Start telmisartan 40 mg daily for your BP.   Your prescription(s) have been submitted to your pharmacy. Please take as directed and contact our office if you believe you are having problem(s) with the medication(s).   A referral was ordered for diabetic nutritionist.        Someone from their office will call you to schedule an appointment.    Please followup in 3 months

## 2021-04-02 NOTE — Assessment & Plan Note (Signed)
Chronic Distress getting her sugars better controlled

## 2021-04-04 ENCOUNTER — Telehealth: Payer: Self-pay | Admitting: Internal Medicine

## 2021-04-04 NOTE — Telephone Encounter (Signed)
Pt. Is requesting a call back from [facility] to discuss some medications that have caused adverse side effects. Pt. Did not disclose any further information.    Callback #- [phone]

## 2021-04-09 MED ORDER — OZEMPIC (0.25 OR 0.5 MG/DOSE) 2 MG/1.5ML ~~LOC~~ SOPN: PEN_INJECTOR | SUBCUTANEOUS | 0 refills | Status: DC

## 2021-04-09 NOTE — Telephone Encounter (Signed)
Telmisartan removed from chart and put allergy list as an intolerance  Will stop Rybelsus due to stomach upset and nausea.  Restart Ozempic

## 2021-04-10 DIAGNOSIS — E1142 Type 2 diabetes mellitus with diabetic polyneuropathy: Secondary | ICD-10-CM | POA: Diagnosis not present

## 2021-04-10 DIAGNOSIS — Z794 Long term (current) use of insulin: Secondary | ICD-10-CM | POA: Diagnosis not present

## 2021-04-19 DIAGNOSIS — M170 Bilateral primary osteoarthritis of knee: Secondary | ICD-10-CM | POA: Diagnosis not present

## 2021-04-19 DIAGNOSIS — G894 Chronic pain syndrome: Secondary | ICD-10-CM | POA: Diagnosis not present

## 2021-04-19 DIAGNOSIS — M5136 Other intervertebral disc degeneration, lumbar region: Secondary | ICD-10-CM | POA: Diagnosis not present

## 2021-04-19 DIAGNOSIS — M5431 Sciatica, right side: Secondary | ICD-10-CM | POA: Diagnosis not present

## 2021-04-22 DIAGNOSIS — M5136 Other intervertebral disc degeneration, lumbar region: Secondary | ICD-10-CM | POA: Diagnosis not present

## 2021-04-22 DIAGNOSIS — M170 Bilateral primary osteoarthritis of knee: Secondary | ICD-10-CM | POA: Diagnosis not present

## 2021-04-22 DIAGNOSIS — M5431 Sciatica, right side: Secondary | ICD-10-CM | POA: Diagnosis not present

## 2021-04-22 DIAGNOSIS — G894 Chronic pain syndrome: Secondary | ICD-10-CM | POA: Diagnosis not present

## 2021-04-28 DIAGNOSIS — G894 Chronic pain syndrome: Secondary | ICD-10-CM | POA: Diagnosis not present

## 2021-04-28 DIAGNOSIS — M170 Bilateral primary osteoarthritis of knee: Secondary | ICD-10-CM | POA: Diagnosis not present

## 2021-04-28 DIAGNOSIS — M5431 Sciatica, right side: Secondary | ICD-10-CM | POA: Diagnosis not present

## 2021-04-28 DIAGNOSIS — M5432 Sciatica, left side: Secondary | ICD-10-CM | POA: Diagnosis not present

## 2021-04-28 DIAGNOSIS — M5136 Other intervertebral disc degeneration, lumbar region: Secondary | ICD-10-CM | POA: Diagnosis not present

## 2021-05-06 ENCOUNTER — Ambulatory Visit: Payer: Medicare Other | Admitting: Nutrition

## 2021-05-06 ENCOUNTER — Encounter: Payer: Medicare Other | Attending: Internal Medicine | Admitting: Nutrition

## 2021-05-12 ENCOUNTER — Other Ambulatory Visit: Payer: Self-pay | Admitting: Internal Medicine

## 2021-05-18 ENCOUNTER — Other Ambulatory Visit: Payer: Self-pay | Admitting: Internal Medicine

## 2021-05-20 ENCOUNTER — Ambulatory Visit: Payer: Medicare Other | Admitting: Nutrition

## 2021-05-22 DIAGNOSIS — G8929 Other chronic pain: Secondary | ICD-10-CM | POA: Diagnosis not present

## 2021-05-26 DIAGNOSIS — G8929 Other chronic pain: Secondary | ICD-10-CM | POA: Diagnosis not present

## 2021-05-26 DIAGNOSIS — M5136 Other intervertebral disc degeneration, lumbar region: Secondary | ICD-10-CM | POA: Diagnosis not present

## 2021-05-26 DIAGNOSIS — M5431 Sciatica, right side: Secondary | ICD-10-CM | POA: Diagnosis not present

## 2021-05-26 DIAGNOSIS — M5432 Sciatica, left side: Secondary | ICD-10-CM | POA: Diagnosis not present

## 2021-05-27 DIAGNOSIS — E1140 Type 2 diabetes mellitus with diabetic neuropathy, unspecified: Secondary | ICD-10-CM | POA: Diagnosis not present

## 2021-05-27 DIAGNOSIS — K5900 Constipation, unspecified: Secondary | ICD-10-CM | POA: Diagnosis not present

## 2021-05-27 DIAGNOSIS — Z0000 Encounter for general adult medical examination without abnormal findings: Secondary | ICD-10-CM | POA: Diagnosis not present

## 2021-05-27 DIAGNOSIS — M1990 Unspecified osteoarthritis, unspecified site: Secondary | ICD-10-CM | POA: Diagnosis not present

## 2021-06-05 ENCOUNTER — Ambulatory Visit: Payer: Medicare Other | Admitting: Dietician

## 2021-06-11 ENCOUNTER — Other Ambulatory Visit: Payer: Self-pay | Admitting: Internal Medicine

## 2021-06-21 DIAGNOSIS — G894 Chronic pain syndrome: Secondary | ICD-10-CM | POA: Diagnosis not present

## 2021-06-23 DIAGNOSIS — M79669 Pain in unspecified lower leg: Secondary | ICD-10-CM | POA: Diagnosis not present

## 2021-06-23 DIAGNOSIS — M47897 Other spondylosis, lumbosacral region: Secondary | ICD-10-CM | POA: Diagnosis not present

## 2021-06-23 DIAGNOSIS — G8929 Other chronic pain: Secondary | ICD-10-CM | POA: Diagnosis not present

## 2021-06-23 DIAGNOSIS — M5431 Sciatica, right side: Secondary | ICD-10-CM | POA: Diagnosis not present

## 2021-06-23 DIAGNOSIS — M5136 Other intervertebral disc degeneration, lumbar region: Secondary | ICD-10-CM | POA: Diagnosis not present

## 2021-06-23 DIAGNOSIS — M48062 Spinal stenosis, lumbar region with neurogenic claudication: Secondary | ICD-10-CM | POA: Diagnosis not present

## 2021-06-23 DIAGNOSIS — R5382 Chronic fatigue, unspecified: Secondary | ICD-10-CM | POA: Diagnosis not present

## 2021-06-23 DIAGNOSIS — I119 Hypertensive heart disease without heart failure: Secondary | ICD-10-CM | POA: Diagnosis not present

## 2021-06-23 DIAGNOSIS — M792 Neuralgia and neuritis, unspecified: Secondary | ICD-10-CM | POA: Diagnosis not present

## 2021-07-06 NOTE — Patient Instructions (Signed)
°  Blood work was ordered.   ° ° °Medications changes include :    ° °Your prescription(s) have been submitted to your pharmacy. Please take as directed and contact our office if you believe you are having problem(s) with the medication(s). ° ° °A referral was ordered for        Someone from their office will call you to schedule an appointment.  ° ° °Please followup in 3 months ° °

## 2021-07-06 NOTE — Progress Notes (Signed)
Subjective:    Patient ID: Lindsay Hardy, female    DOB: Aug 25, 1950, 71 y.o.   MRN: [id number]  This visit occurred during the SARS-CoV-2 public health emergency.  Safety protocols were in place, including screening questions prior to the visit, additional usage of staff PPE, and extensive cleaning of exam room while observing appropriate contact time as indicated for disinfecting solutions.     HPI The patient is here for follow up of their chronic medical problems, including DM with neuropathy, htn, hld, chronic constipation    Medications and allergies reviewed with patient and updated if appropriate.  Patient Active Problem List   Diagnosis Date Noted   Aortic atherosclerosis ([place]) 11/05/2020   Allergic rhinitis 11/22/2018   External hemorrhoids 10/07/2018   Chronic constipation 10/07/2018   Morbid obesity ([place]) 04/01/2017   PAD (peripheral artery disease) ([place]) 10/29/2016   Chronic back pain 09/23/2016   Diabetic peripheral neuropathy ([place]) 02/05/2016   Diabetes ([place]) 06/17/2006   Hyperlipidemia 06/17/2006   Essential hypertension 06/17/2006   Coronary atherosclerosis 04/88/8916   DIASTOLIC DYSFUNCTION 94/50/3888   DISEASE, CEREBROVASCULAR NEC 06/17/2006   Osteoarthritis of both knees 06/17/2006    Current Outpatient Medications on File Prior to Visit  Medication Sig Dispense Refill   ACCU-CHEK GUIDE test strip USE UP TO FOUR TIMES DAILY AS DIRECTED 100 strip 5   Accu-Chek Softclix Lancets lancets USE UP TO FOUR TIMES DAILY AS DIRECTED 100 each 1   albuterol (VENTOLIN HFA) 108 (90 Base) MCG/ACT inhaler INHALE 2 PUFFS INTO THE LUNGS EVERY 4 (FOUR) HOURS AS NEEDED FOR SHORTNESS OF BREATH. 6.7 each 11   amLODipine (NORVASC) 10 MG tablet TAKE 1 TABLET BY MOUTH EVERY DAY 90 tablet 1   aspirin 81 MG EC tablet TAKE 1 TABLET (81 MG TOTAL) BY MOUTH DAILY. 90 tablet 1   Blood Glucose Monitoring Suppl (ONETOUCH VERIO IQ SYSTEM) w/Device KIT Use to check  blood sugar once to twice daily. 1 kit 0   diclofenac Sodium (VOLTAREN) 1 % GEL Apply 4 g topically 4 (four) times daily. 350 g 5   fluticasone (FLONASE) 50 MCG/ACT nasal spray Place 2 sprays into both nostrils daily. 16 g 6   glipiZIDE (GLUCOTROL) 10 MG tablet Take 2 tablets (20 mg total) by mouth 2 (two) times daily before a meal. 360 tablet 1   HYDROcodone-acetaminophen (NORCO) 10-325 MG tablet SMARTSIG:1 Tablet(s) By Mouth 4-6 Times Daily     ibuprofen (ADVIL) 800 MG tablet TAKE 1 TABLET (800 MG TOTAL) BY MOUTH 2 (TWO) TIMES DAILY AS NEEDED FOR MODERATE PAIN. 180 tablet 0   metFORMIN (GLUCOPHAGE-XR) 500 MG 24 hr tablet TAKE 2 TABLETS BY MOUTH 2 TIMES DAILY BEFORE A MEAL. 360 tablet 1   Misc. Devices (BARIATRIC ROLLATOR) MISC Use as directed.  Dx  M54.9 1 each 0   montelukast (SINGULAIR) 10 MG tablet TAKE 1 TABLET BY MOUTH EVERYDAY AT BEDTIME 90 tablet 1   NARCAN 4 MG/0.1ML LIQD nasal spray kit 1 spray once.     RESTASIS 0.05 % ophthalmic emulsion      traMADol (ULTRAM) 50 MG tablet PLEASE SEE ATTACHED FOR DETAILED DIRECTIONS     No current facility-administered medications on file prior to visit.    Past Medical History:  Diagnosis Date   Asthma    CAD (coronary artery disease) 11/2003    RCA 60% stenosis, LAD diffuse   Chicken pox    CVA (cerebrovascular accident) ([place]) 2001    right frontoparietal cortical  CVA - MRI September 2001   Diabetes mellitus    Diastolic dysfunction     ejection fraction 55-65% on 2-D echo May 2006   Empty sella syndrome [facility])     based on MRI September 2001, related to obesity, hypertension, it was determined that patient has not required treatment but will be monitored with TSH, ACTH, cortisol, testosterone, prolactin, growth hormone   GERD (gastroesophageal reflux disease)     EGD June 2004 -  followed by Dr. Carlean Purl   Granulomatous disease, chronic ([place])     in right upper lobe per x-ray December 2005   Hyperlipidemia     Hypertension    NAFLD (nonalcoholic fatty liver disease)     based on ultrasound June 2002, elevated transaminase   OA (osteoarthritis)     DJP coracoclavicular ligament, left patella osteophytes June 2002, L4-S1 spondylosis, degenerative disc disease with bulge. I in October 2001, the C2-C4 spondylosis without stenosis and with spurs based on x-ray July 2005; diffuse idiopathic skeletal hyperostosis with bilateral hip lumbar degenerative changes   on x-ray February 2005, bilateral plantar calcaneal spurs   Reactive airway disease     peak flow 180 in May 2005, chronic sinusitis with PND, bronchitis December 2002 and strep pneumonia April 2007,  FEC 66, MCV 60, ratio is 69, DLCO 57,  moderate restrictive and mild obstructive disease  on spirometry May 2005    Past Surgical History:  Procedure Laterality Date   CARPAL [place]    left arm 1992   CHOLECYSTECTOMY  2003   TONSILLECTOMY AND ADENOIDECTOMY      Social History   Socioeconomic History   Marital status: Single    Spouse name: Not on file   Number of children: 1   Years of education: 14   Highest education level: Not on file  Occupational History   Occupation:  former Psychologist, counselling  Tobacco Use   Smoking status: Former    Packs/day: 0.25    Years: 4.00    Pack years: 1.00    Types: Cigarettes   Smokeless tobacco: Never   Tobacco comments:    smoked in 20's   Vaping Use   Vaping Use: Never used  Substance and Sexual Activity   Alcohol use: No    Alcohol/week: 0.0 standard drinks   Drug use: No   Sexual activity: Not Currently    Birth control/protection: Post-menopausal  Other Topics Concern   Not on file  Social History Narrative   Born and raised in [place], [place]. Currently lives in a private residence with her nephew and his wife. Fun: Church activities, spending time with family   Denies any religious beliefs that would effect health care.    Social Determinants of Health    Financial Resource Strain: Not on file  Food Insecurity: Not on file  Transportation Needs: Not on file  Physical Activity: Not on file  Stress: Not on file  Social Connections: Not on file    Family History  Problem Relation Age of Onset   Lung cancer Mother    Hypertension Mother    Lung cancer Sister    Hypertension Sister     Review of Systems     Objective:  There were no vitals filed for this visit. BP Readings from Last 3 Encounters:  07/11/21 (!) 142/82  04/02/21 (!) 144/90  12/18/20 (!) 155/94   Wt Readings from Last 3 Encounters:  07/11/21 267 lb 9.6 oz (121.4 kg)  04/02/21 279  lb (126.6 kg)  08/06/20 274 lb (124.3 kg)   There is no height or weight on file to calculate BMI.   Physical Exam    Constitutional: Appears well-developed and well-nourished. No distress.  HENT:  Head: Normocephalic and atraumatic.  Neck: Neck supple. No tracheal deviation present. No thyromegaly present.  No cervical lymphadenopathy Cardiovascular: Normal rate, regular rhythm and normal heart sounds.   No murmur heard. No carotid bruit .  No edema Pulmonary/Chest: Effort normal and breath sounds normal. No respiratory distress. No has no wheezes. No rales.  Skin: Skin is warm and dry. Not diaphoretic.  Psychiatric: Normal mood and affect. Behavior is normal.      Assessment & Plan:    See Problem List for Assessment and Plan of chronic medical problems.     This encounter was created in error - please disregard.

## 2021-07-07 ENCOUNTER — Encounter: Payer: Medicare Other | Admitting: Internal Medicine

## 2021-07-07 DIAGNOSIS — I10 Essential (primary) hypertension: Secondary | ICD-10-CM

## 2021-07-07 DIAGNOSIS — E1165 Type 2 diabetes mellitus with hyperglycemia: Secondary | ICD-10-CM

## 2021-07-07 DIAGNOSIS — E1142 Type 2 diabetes mellitus with diabetic polyneuropathy: Secondary | ICD-10-CM

## 2021-07-07 DIAGNOSIS — K5909 Other constipation: Secondary | ICD-10-CM

## 2021-07-10 NOTE — Progress Notes (Signed)
Subjective:    Patient ID: Lindsay Hardy, female    DOB: 26-Jun-1950, 71 y.o.   MRN: [id number]  This visit occurred during the SARS-CoV-2 public health emergency.  Safety protocols were in place, including screening questions prior to the visit, additional usage of staff PPE, and extensive cleaning of exam room while observing appropriate contact time as indicated for disinfecting solutions.     HPI The patient is here for follow up of their chronic medical problems, including DM with neuropathy, htn, hld, chronic constipation  She started ozempic at her last appointment.  It does cause some blurry vision.  She thinks she wants to stop it.    Sugars at home 100's - highest 175.  She is eating better.  She has lost some weight.      Medications and allergies reviewed with patient and updated if appropriate.  Patient Active Problem List   Diagnosis Date Noted   Aortic atherosclerosis ([place]) 11/05/2020   Allergic rhinitis 11/22/2018   External hemorrhoids 10/07/2018   Chronic constipation 10/07/2018   Morbid obesity ([place]) 04/01/2017   PAD (peripheral artery disease) ([place]) 10/29/2016   Chronic back pain 09/23/2016   Diabetic peripheral neuropathy ([place]) 02/05/2016   Diabetes ([place]) 06/17/2006   Hyperlipidemia 06/17/2006   Essential hypertension 06/17/2006   Coronary atherosclerosis 01/74/9449   DIASTOLIC DYSFUNCTION 67/59/1638   DISEASE, CEREBROVASCULAR NEC 06/17/2006   Osteoarthritis of both knees 06/17/2006    Current Outpatient Medications on File Prior to Visit  Medication Sig Dispense Refill   ACCU-CHEK GUIDE test strip USE UP TO FOUR TIMES DAILY AS DIRECTED 100 strip 5   Accu-Chek Softclix Lancets lancets USE UP TO FOUR TIMES DAILY AS DIRECTED 100 each 1   albuterol (VENTOLIN HFA) 108 (90 Base) MCG/ACT inhaler INHALE 2 PUFFS INTO THE LUNGS EVERY 4 (FOUR) HOURS AS NEEDED FOR SHORTNESS OF BREATH. 6.7 each 11   amLODipine (NORVASC) 10 MG tablet TAKE 1 TABLET BY MOUTH  EVERY DAY 90 tablet 1   aspirin 81 MG EC tablet TAKE 1 TABLET (81 MG TOTAL) BY MOUTH DAILY. 90 tablet 1   Blood Glucose Monitoring Suppl (ONETOUCH VERIO IQ SYSTEM) w/Device KIT Use to check blood sugar once to twice daily. 1 kit 0   diclofenac Sodium (VOLTAREN) 1 % GEL Apply 4 g topically 4 (four) times daily. 350 g 5   fluticasone (FLONASE) 50 MCG/ACT nasal spray Place 2 sprays into both nostrils daily. 16 g 6   glipiZIDE (GLUCOTROL) 10 MG tablet Take 2 tablets (20 mg total) by mouth 2 (two) times daily before a meal. 360 tablet 1   HYDROcodone-acetaminophen (NORCO) 10-325 MG tablet SMARTSIG:1 Tablet(s) By Mouth 4-6 Times Daily     ibuprofen (ADVIL) 800 MG tablet TAKE 1 TABLET (800 MG TOTAL) BY MOUTH 2 (TWO) TIMES DAILY AS NEEDED FOR MODERATE PAIN. 180 tablet 0   metFORMIN (GLUCOPHAGE-XR) 500 MG 24 hr tablet TAKE 2 TABLETS BY MOUTH 2 TIMES DAILY BEFORE A MEAL. 360 tablet 1   Misc. Devices (BARIATRIC ROLLATOR) MISC Use as directed.  Dx  M54.9 1 each 0   montelukast (SINGULAIR) 10 MG tablet TAKE 1 TABLET BY MOUTH EVERYDAY AT BEDTIME 90 tablet 1   NARCAN 4 MG/0.1ML LIQD nasal spray kit 1 spray once.     RESTASIS 0.05 % ophthalmic emulsion      traMADol (ULTRAM) 50 MG tablet PLEASE SEE ATTACHED FOR DETAILED DIRECTIONS     telmisartan (MICARDIS) 40 MG tablet Take 40 mg by  mouth daily.     No current facility-administered medications on file prior to visit.    Past Medical History:  Diagnosis Date   Asthma    CAD (coronary artery disease) 11/2003    RCA 60% stenosis, LAD diffuse   Chicken pox    CVA (cerebrovascular accident) ([place]) 2001    right frontoparietal cortical CVA - MRI September 2001   Diabetes mellitus    Diastolic dysfunction     ejection fraction 55-65% on 2-D echo May 2006   Empty sella syndrome [facility])     based on MRI September 2001, related to obesity, hypertension, it was determined that patient has not required treatment but will be monitored with TSH, ACTH, cortisol,  testosterone, prolactin, growth hormone   GERD (gastroesophageal reflux disease)     EGD June 2004 -  followed by Dr. Carlean Purl   Granulomatous disease, chronic ([place])     in right upper lobe per x-ray December 2005   Hyperlipidemia    Hypertension    NAFLD (nonalcoholic fatty liver disease)     based on ultrasound June 2002, elevated transaminase   OA (osteoarthritis)     DJP coracoclavicular ligament, left patella osteophytes June 2002, L4-S1 spondylosis, degenerative disc disease with bulge. I in October 2001, the C2-C4 spondylosis without stenosis and with spurs based on x-ray July 2005; diffuse idiopathic skeletal hyperostosis with bilateral hip lumbar degenerative changes   on x-ray February 2005, bilateral plantar calcaneal spurs   Reactive airway disease     peak flow 180 in May 2005, chronic sinusitis with PND, bronchitis December 2002 and strep pneumonia April 2007,  FEC 66, MCV 60, ratio is 69, DLCO 57,  moderate restrictive and mild obstructive disease  on spirometry May 2005    Past Surgical History:  Procedure Laterality Date   CARPAL [place]    left arm 1992   CHOLECYSTECTOMY  2003   TONSILLECTOMY AND ADENOIDECTOMY      Social History   Socioeconomic History   Marital status: Single    Spouse name: Not on file   Number of children: 1   Years of education: 14   Highest education level: Not on file  Occupational History   Occupation:  former Psychologist, counselling  Tobacco Use   Smoking status: Former    Packs/day: 0.25    Years: 4.00    Pack years: 1.00    Types: Cigarettes   Smokeless tobacco: Never   Tobacco comments:    smoked in 20's   Vaping Use   Vaping Use: Never used  Substance and Sexual Activity   Alcohol use: No    Alcohol/week: 0.0 standard drinks   Drug use: No   Sexual activity: Not Currently    Birth control/protection: Post-menopausal  Other Topics Concern   Not on file  Social History Narrative   Born and raised in [place],  [place]. Currently lives in a private residence with her nephew and his wife. Fun: Church activities, spending time with family   Denies any religious beliefs that would effect health care.    Social Determinants of Health   Financial Resource Strain: Not on file  Food Insecurity: Not on file  Transportation Needs: Not on file  Physical Activity: Not on file  Stress: Not on file  Social Connections: Not on file    Family History  Problem Relation Age of Onset   Lung cancer Mother    Hypertension Mother    Lung cancer Sister  Hypertension Sister     Review of Systems  Constitutional:  Negative for fever.  Respiratory:  Positive for wheezing (occ - asthma). Negative for cough and shortness of breath.   Cardiovascular:  Negative for chest pain, palpitations and leg swelling.  Neurological:  Positive for light-headedness (occ). Negative for headaches.      Objective:   Vitals:   07/11/21 1408 07/11/21 1446  BP: (!) 162/92 (!) 142/82  Pulse: 97   Temp: 98.1 F (36.7 C)   SpO2: 99%    BP Readings from Last 3 Encounters:  07/11/21 (!) 142/82  04/02/21 (!) 144/90  12/18/20 (!) 155/94   Wt Readings from Last 3 Encounters:  07/11/21 267 lb 9.6 oz (121.4 kg)  04/02/21 279 lb (126.6 kg)  08/06/20 274 lb (124.3 kg)   Body mass index is 44.53 kg/m.   Physical Exam    Constitutional: Appears well-developed and well-nourished. No distress.  HENT:  Head: Normocephalic and atraumatic.  Neck: Neck supple. No tracheal deviation present. No thyromegaly present.  No cervical lymphadenopathy Cardiovascular: Normal rate, regular rhythm and normal heart sounds.   No murmur heard. No carotid bruit .  No edema Pulmonary/Chest: Effort normal and breath sounds normal. No respiratory distress. No has no wheezes. No rales.  Skin: Skin is warm and dry. Not diaphoretic.  Psychiatric: Normal mood and affect. Behavior is normal.      Assessment & Plan:    See Problem List for  Assessment and Plan of chronic medical problems.

## 2021-07-10 NOTE — Patient Instructions (Addendum)
° ° ° °  Blood work was ordered.     Medications changes include :   stop potassium and stop the ozempic  Your prescription(s) have been submitted to your pharmacy. Please take as directed and contact our office if you believe you are having problem(s) with the medication(s).    Please followup in 4 months

## 2021-07-11 ENCOUNTER — Other Ambulatory Visit: Payer: Self-pay

## 2021-07-11 ENCOUNTER — Ambulatory Visit (INDEPENDENT_AMBULATORY_CARE_PROVIDER_SITE_OTHER): Payer: Medicare Other | Admitting: Internal Medicine

## 2021-07-11 ENCOUNTER — Encounter: Payer: Self-pay | Admitting: Internal Medicine

## 2021-07-11 ENCOUNTER — Other Ambulatory Visit: Payer: Self-pay | Admitting: Internal Medicine

## 2021-07-11 VITALS — BP 142/82 | HR 97 | Temp 98.1°F | Ht 65.0 in | Wt 267.6 lb

## 2021-07-11 DIAGNOSIS — E1142 Type 2 diabetes mellitus with diabetic polyneuropathy: Secondary | ICD-10-CM

## 2021-07-11 DIAGNOSIS — K5909 Other constipation: Secondary | ICD-10-CM

## 2021-07-11 DIAGNOSIS — E1165 Type 2 diabetes mellitus with hyperglycemia: Secondary | ICD-10-CM | POA: Diagnosis not present

## 2021-07-11 DIAGNOSIS — I10 Essential (primary) hypertension: Secondary | ICD-10-CM

## 2021-07-11 DIAGNOSIS — E782 Mixed hyperlipidemia: Secondary | ICD-10-CM

## 2021-07-11 LAB — COMPREHENSIVE METABOLIC PANEL
ALT: 26 U/L (ref 0–35)
AST: 24 U/L (ref 0–37)
Albumin: 4.2 g/dL (ref 3.5–5.2)
Alkaline Phosphatase: 66 U/L (ref 39–117)
BUN: 13 mg/dL (ref 6–23)
CO2: 32 mEq/L (ref 19–32)
Calcium: 9.4 mg/dL (ref 8.4–10.5)
Chloride: 98 mEq/L (ref 96–112)
Creatinine, Ser: 0.83 mg/dL (ref 0.40–1.20)
GFR: 70.58 mL/min (ref >60.00)
Glucose, Bld: 126 mg/dL — ABNORMAL HIGH (ref 70–99)
Potassium: 3.7 mEq/L (ref 3.5–5.1)
Sodium: 136 mEq/L (ref 135–145)
Total Bilirubin: 0.5 mg/dL (ref 0.2–1.2)
Total Protein: 7.8 g/dL (ref 6.0–8.3)

## 2021-07-11 LAB — LIPID PANEL
Cholesterol: 158 mg/dL (ref 0–200)
HDL: 35.9 mg/dL — ABNORMAL LOW (ref >39.00)
LDL Cholesterol: 105 mg/dL — ABNORMAL HIGH (ref 0–99)
NonHDL: 122.56
Total CHOL/HDL Ratio: 4
Triglycerides: 88 mg/dL (ref 0.0–149.0)
VLDL: 17.6 mg/dL (ref 0.0–40.0)

## 2021-07-11 LAB — MICROALBUMIN / CREATININE URINE RATIO
Creatinine,U: 78.9 mg/dL
Microalb Creat Ratio: 11.1 mg/g (ref 0.0–30.0)
Microalb, Ur: 8.8 mg/dL — ABNORMAL HIGH (ref 0.0–1.9)

## 2021-07-11 LAB — HEMOGLOBIN A1C: Hgb A1c MFr Bld: 7.7 % — ABNORMAL HIGH (ref 4.6–6.5)

## 2021-07-11 MED ORDER — DEXCOM G6 TRANSMITTER MISC: 0 refills | Status: DC

## 2021-07-11 MED ORDER — LINACLOTIDE 145 MCG PO CAPS: 145.0000 ug | ORAL_CAPSULE | Freq: Every day | ORAL | 1 refills | Status: DC

## 2021-07-11 MED ORDER — DEXCOM G6 SENSOR MISC: 3 refills | Status: DC

## 2021-07-11 NOTE — Assessment & Plan Note (Addendum)
Chronic BP ? Controlled - she did not take her meds today BP improved with retaking it Continue amlodipine 10 mg telmisartan 40 mg daily cmp

## 2021-07-13 NOTE — Assessment & Plan Note (Signed)
Chronic Has lost some weight - likely related to diet changes and ozempic Needs to d/c ozempic - deferred other injectable medication at this time - will discuss at future appts Continue diabetic diet

## 2021-07-13 NOTE — Assessment & Plan Note (Signed)
Chronic Did not tolerate statins  Check lipid panel  Regular exercise and healthy diet encouraged

## 2021-07-13 NOTE — Assessment & Plan Note (Signed)
Chronic Controlled, stable Continue linzess 145 mcg daily 

## 2021-07-13 NOTE — Assessment & Plan Note (Signed)
Chronic Working on getting sugars better controlled

## 2021-07-13 NOTE — Assessment & Plan Note (Signed)
Chronic Sugars much better controlled at home Having side effects from ozempic - causing blurry vision - ? Retinopathy - will see eye doctor and will stop ozempic Deferred other injectable - I am concerned her sugars will increase, but will monitor Continue metformin xr 1000 mg bid Continue glipizide 20 mg bid ac

## 2021-07-21 DIAGNOSIS — G894 Chronic pain syndrome: Secondary | ICD-10-CM | POA: Diagnosis not present

## 2021-07-21 DIAGNOSIS — M5136 Other intervertebral disc degeneration, lumbar region: Secondary | ICD-10-CM | POA: Diagnosis not present

## 2021-07-21 DIAGNOSIS — M5431 Sciatica, right side: Secondary | ICD-10-CM | POA: Diagnosis not present

## 2021-07-22 ENCOUNTER — Telehealth: Payer: Self-pay | Admitting: Internal Medicine

## 2021-07-22 NOTE — Telephone Encounter (Signed)
Pt states she's had periodic abdominal pain w/diarrhea x1w  Pt requesting a c/b to discuss medication options for symptoms  Offered pt an appt, pt declined stating " I'm hurting so much I can't get out the bed."

## 2021-07-23 NOTE — Telephone Encounter (Signed)
Spoke with patient yesterday and advised if abdominal pain was that intense she needed to either go to the ED or Urgent Care. Patient states she believes she just has a stomach bug. She said she would call back or go to the ED if she didn't feel better by tomorrow.  Patient advised again to please seek treatment today rather than waiting.  She agreed to be seen if pain got worse.

## 2021-07-24 DIAGNOSIS — E1142 Type 2 diabetes mellitus with diabetic polyneuropathy: Secondary | ICD-10-CM | POA: Diagnosis not present

## 2021-07-24 DIAGNOSIS — Z794 Long term (current) use of insulin: Secondary | ICD-10-CM | POA: Diagnosis not present

## 2021-08-12 ENCOUNTER — Other Ambulatory Visit: Payer: Self-pay | Admitting: Internal Medicine

## 2021-08-18 DIAGNOSIS — G894 Chronic pain syndrome: Secondary | ICD-10-CM | POA: Diagnosis not present

## 2021-08-18 DIAGNOSIS — M5431 Sciatica, right side: Secondary | ICD-10-CM | POA: Diagnosis not present

## 2021-08-18 DIAGNOSIS — M5136 Other intervertebral disc degeneration, lumbar region: Secondary | ICD-10-CM | POA: Diagnosis not present

## 2021-08-21 ENCOUNTER — Other Ambulatory Visit: Payer: Self-pay | Admitting: Internal Medicine

## 2021-08-24 DIAGNOSIS — Z794 Long term (current) use of insulin: Secondary | ICD-10-CM | POA: Diagnosis not present

## 2021-08-24 DIAGNOSIS — E1142 Type 2 diabetes mellitus with diabetic polyneuropathy: Secondary | ICD-10-CM | POA: Diagnosis not present

## 2021-09-05 DIAGNOSIS — G894 Chronic pain syndrome: Secondary | ICD-10-CM | POA: Diagnosis not present

## 2021-09-10 ENCOUNTER — Other Ambulatory Visit: Payer: Self-pay | Admitting: Internal Medicine

## 2021-09-15 DIAGNOSIS — M5136 Other intervertebral disc degeneration, lumbar region: Secondary | ICD-10-CM | POA: Diagnosis not present

## 2021-09-15 DIAGNOSIS — G894 Chronic pain syndrome: Secondary | ICD-10-CM | POA: Diagnosis not present

## 2021-09-15 DIAGNOSIS — M5432 Sciatica, left side: Secondary | ICD-10-CM | POA: Diagnosis not present

## 2021-09-15 DIAGNOSIS — M5431 Sciatica, right side: Secondary | ICD-10-CM | POA: Diagnosis not present

## 2021-09-18 ENCOUNTER — Other Ambulatory Visit: Payer: Self-pay | Admitting: Internal Medicine

## 2021-09-18 MED ORDER — NARCAN 4 MG/0.1ML NA LIQD: 1.0000 | Freq: Once | NASAL | 0 refills | Status: AC

## 2021-09-19 DIAGNOSIS — G894 Chronic pain syndrome: Secondary | ICD-10-CM | POA: Diagnosis not present

## 2021-09-24 DIAGNOSIS — E1142 Type 2 diabetes mellitus with diabetic polyneuropathy: Secondary | ICD-10-CM | POA: Diagnosis not present

## 2021-09-24 DIAGNOSIS — Z794 Long term (current) use of insulin: Secondary | ICD-10-CM | POA: Diagnosis not present

## 2021-09-27 ENCOUNTER — Other Ambulatory Visit: Payer: Self-pay | Admitting: Internal Medicine

## 2021-10-05 DIAGNOSIS — G894 Chronic pain syndrome: Secondary | ICD-10-CM | POA: Diagnosis not present

## 2021-10-19 DIAGNOSIS — M5431 Sciatica, right side: Secondary | ICD-10-CM | POA: Diagnosis not present

## 2021-10-19 DIAGNOSIS — M5136 Other intervertebral disc degeneration, lumbar region: Secondary | ICD-10-CM | POA: Diagnosis not present

## 2021-10-27 DIAGNOSIS — M47897 Other spondylosis, lumbosacral region: Secondary | ICD-10-CM | POA: Diagnosis not present

## 2021-10-27 DIAGNOSIS — G8929 Other chronic pain: Secondary | ICD-10-CM | POA: Diagnosis not present

## 2021-10-27 DIAGNOSIS — M792 Neuralgia and neuritis, unspecified: Secondary | ICD-10-CM | POA: Diagnosis not present

## 2021-10-27 DIAGNOSIS — M5431 Sciatica, right side: Secondary | ICD-10-CM | POA: Diagnosis not present

## 2021-10-27 DIAGNOSIS — G894 Chronic pain syndrome: Secondary | ICD-10-CM | POA: Diagnosis not present

## 2021-10-27 DIAGNOSIS — M4807 Spinal stenosis, lumbosacral region: Secondary | ICD-10-CM | POA: Diagnosis not present

## 2021-10-27 DIAGNOSIS — M48062 Spinal stenosis, lumbar region with neurogenic claudication: Secondary | ICD-10-CM | POA: Diagnosis not present

## 2021-10-27 DIAGNOSIS — R5382 Chronic fatigue, unspecified: Secondary | ICD-10-CM | POA: Diagnosis not present

## 2021-10-27 DIAGNOSIS — I119 Hypertensive heart disease without heart failure: Secondary | ICD-10-CM | POA: Diagnosis not present

## 2021-10-27 DIAGNOSIS — M79609 Pain in unspecified limb: Secondary | ICD-10-CM | POA: Diagnosis not present

## 2021-10-27 DIAGNOSIS — M5136 Other intervertebral disc degeneration, lumbar region: Secondary | ICD-10-CM | POA: Diagnosis not present

## 2021-11-12 ENCOUNTER — Ambulatory Visit: Payer: Medicare Other | Admitting: Internal Medicine

## 2021-11-15 ENCOUNTER — Other Ambulatory Visit: Payer: Self-pay | Admitting: Internal Medicine

## 2021-11-17 ENCOUNTER — Encounter: Payer: Self-pay | Admitting: Internal Medicine

## 2021-11-18 DIAGNOSIS — M5431 Sciatica, right side: Secondary | ICD-10-CM | POA: Diagnosis not present

## 2021-11-18 DIAGNOSIS — M5136 Other intervertebral disc degeneration, lumbar region: Secondary | ICD-10-CM | POA: Diagnosis not present

## 2021-11-24 DIAGNOSIS — M5136 Other intervertebral disc degeneration, lumbar region: Secondary | ICD-10-CM | POA: Diagnosis not present

## 2021-11-24 DIAGNOSIS — G894 Chronic pain syndrome: Secondary | ICD-10-CM | POA: Diagnosis not present

## 2021-12-01 ENCOUNTER — Telehealth: Payer: Self-pay | Admitting: Internal Medicine

## 2021-12-01 NOTE — Telephone Encounter (Signed)
Pt is requesting a callback. She stated she was referred to a vascular doctor a few years ago and she would like to know the name of the facility and the name of the physician that she saw.   Please advise.  Callback: [phone]

## 2021-12-03 NOTE — Telephone Encounter (Signed)
Called patient to get a bit more information regarding her request.There was no answer, was able to leave a message for a call back.

## 2021-12-11 ENCOUNTER — Other Ambulatory Visit: Payer: Self-pay | Admitting: Internal Medicine

## 2021-12-18 DIAGNOSIS — M5431 Sciatica, right side: Secondary | ICD-10-CM | POA: Diagnosis not present

## 2021-12-18 DIAGNOSIS — M5136 Other intervertebral disc degeneration, lumbar region: Secondary | ICD-10-CM | POA: Diagnosis not present

## 2021-12-21 ENCOUNTER — Other Ambulatory Visit: Payer: Self-pay | Admitting: Internal Medicine

## 2021-12-22 DIAGNOSIS — M5136 Other intervertebral disc degeneration, lumbar region: Secondary | ICD-10-CM | POA: Diagnosis not present

## 2021-12-22 DIAGNOSIS — G894 Chronic pain syndrome: Secondary | ICD-10-CM | POA: Diagnosis not present

## 2021-12-22 DIAGNOSIS — M5432 Sciatica, left side: Secondary | ICD-10-CM | POA: Diagnosis not present

## 2021-12-22 DIAGNOSIS — M5431 Sciatica, right side: Secondary | ICD-10-CM | POA: Diagnosis not present

## 2021-12-25 ENCOUNTER — Encounter: Payer: Self-pay | Admitting: Internal Medicine

## 2021-12-25 NOTE — Patient Instructions (Signed)
     Blood work was ordered.     Medications changes include :   none   Your prescription(s) have been sent to your pharmacy.    A referral was ordered for [facility] for a colonoscopy.     Someone from that office will call you to schedule an appointment.    Return in about 6 months (around 07/09/2022) for Physical Exam.   [facility] Phone: [phone]  

## 2021-12-25 NOTE — Progress Notes (Signed)
      Subjective:    Patient ID: Lindsay Hardy, female    DOB: 03/04/1951, 71 y.o.   MRN: [id number]     HPI Tacara is here for follow up of her chronic medical problems, including DM w/ neuropathy, htn, hld, chronic constipation  Stopped ozempic - due to blurry vision - ? See eye doctor  Medications and allergies reviewed with patient and updated if appropriate.  Current Outpatient Medications on File Prior to Visit  Medication Sig Dispense Refill  . ibuprofen (ADVIL) 800 MG tablet TAKE 1 TABLET (800 MG TOTAL) BY MOUTH 2 (TWO) TIMES DAILY AS NEEDED FOR MODERATE PAIN. 180 tablet 0  . ACCU-CHEK GUIDE test strip USE UP TO FOUR TIMES DAILY AS DIRECTED 100 strip 5  . Accu-Chek Softclix Lancets lancets USE UP TO FOUR TIMES DAILY AS DIRECTED 100 each 1  . aspirin 81 MG EC tablet TAKE 1 TABLET (81 MG TOTAL) BY MOUTH DAILY. 90 tablet 1  . Blood Glucose Monitoring Suppl (ONETOUCH VERIO IQ SYSTEM) w/Device KIT Use to check blood sugar once to twice daily. 1 kit 0  . fluticasone (FLONASE) 50 MCG/ACT nasal spray Place 2 sprays into both nostrils daily. 16 g 6  . HYDROcodone-acetaminophen (NORCO) 10-325 MG tablet SMARTSIG:1 Tablet(s) By Mouth 4-6 Times Daily    . LINZESS 145 MCG CAPS capsule TAKE 1 CAPSULE BY MOUTH DAILY BEFORE BREAKFAST. 90 capsule 1  . metFORMIN (GLUCOPHAGE-XR) 500 MG 24 hr tablet TAKE 2 TABLETS BY MOUTH 2 TIMES DAILY BEFORE A MEAL. 360 tablet 1  . Misc. Devices (BARIATRIC ROLLATOR) MISC Use as directed.  Dx  M54.9 1 each 0  . montelukast (SINGULAIR) 10 MG tablet TAKE 1 TABLET BY MOUTH EVERYDAY AT BEDTIME 90 tablet 1  . RESTASIS 0.05 % ophthalmic emulsion      No current facility-administered medications on file prior to visit.     Review of Systems     Objective:  There were no vitals filed for this visit. BP Readings from Last 3 Encounters:  07/11/21 (!) 142/82  04/02/21 (!) 144/90  12/18/20 (!) 155/94   Wt Readings from Last 3 Encounters:  07/11/21 267 lb 9.6  oz (121.4 kg)  04/02/21 279 lb (126.6 kg)  08/06/20 274 lb (124.3 kg)   There is no height or weight on file to calculate BMI.    Physical Exam     Lab Results  Component Value Date   WBC 6.9 04/02/2021   HGB 13.4 04/02/2021   HCT 42.1 04/02/2021   PLT 307.0 04/02/2021   GLUCOSE 126 (H) 07/11/2021   CHOL 158 07/11/2021   TRIG 88.0 07/11/2021   HDL 35.90 (L) 07/11/2021   LDLCALC 105 (H) 07/11/2021   ALT 26 07/11/2021   AST 24 07/11/2021   NA 136 07/11/2021   K 3.7 07/11/2021   CL 98 07/11/2021   CREATININE 0.83 07/11/2021   BUN 13 07/11/2021   CO2 32 07/11/2021   TSH 1.84 08/06/2020   HGBA1C 7.7 (H) 07/11/2021   MICROALBUR 8.8 (H) 07/11/2021     Assessment & Plan:    See Problem List for Assessment and Plan of chronic medical problems.    This encounter was created in error - please disregard.

## 2021-12-26 ENCOUNTER — Encounter: Payer: Medicare Other | Admitting: Internal Medicine

## 2021-12-26 DIAGNOSIS — K5909 Other constipation: Secondary | ICD-10-CM

## 2021-12-26 DIAGNOSIS — E782 Mixed hyperlipidemia: Secondary | ICD-10-CM

## 2021-12-26 DIAGNOSIS — I10 Essential (primary) hypertension: Secondary | ICD-10-CM

## 2021-12-26 DIAGNOSIS — E1165 Type 2 diabetes mellitus with hyperglycemia: Secondary | ICD-10-CM

## 2021-12-26 DIAGNOSIS — E1142 Type 2 diabetes mellitus with diabetic polyneuropathy: Secondary | ICD-10-CM

## 2022-01-01 ENCOUNTER — Other Ambulatory Visit: Payer: Self-pay | Admitting: Internal Medicine

## 2022-01-05 ENCOUNTER — Encounter: Payer: Self-pay | Admitting: Internal Medicine

## 2022-01-05 NOTE — Progress Notes (Signed)
Subjective:    Patient ID: Lindsay Hardy, female    DOB: [birth year], 71 y.o.   MRN: [id number]     HPI Javonna is here for follow up of her chronic medical problems, including DM w/ neuropathy, htn, hld, chronic constipation  Stopped ozempic - due to blurry vision - ? See eye doctor - wrosening of retinopathy?   Medications and allergies reviewed with patient and updated if appropriate.  Current Outpatient Medications on File Prior to Visit  Medication Sig Dispense Refill   ibuprofen (ADVIL) 800 MG tablet TAKE 1 TABLET (800 MG TOTAL) BY MOUTH 2 (TWO) TIMES DAILY AS NEEDED FOR MODERATE PAIN. 180 tablet 0   ACCU-CHEK GUIDE test strip USE UP TO FOUR TIMES DAILY AS DIRECTED 100 strip 5   Accu-Chek Softclix Lancets lancets USE UP TO FOUR TIMES DAILY AS DIRECTED 100 each 1   albuterol (VENTOLIN HFA) 108 (90 Base) MCG/ACT inhaler INHALE 2 PUFFS INTO THE LUNGS EVERY 4 (FOUR) HOURS AS NEEDED FOR SHORTNESS OF BREATH. 6.7 each 11   amLODipine (NORVASC) 10 MG tablet TAKE 1 TABLET BY MOUTH EVERY DAY 90 tablet 1   aspirin 81 MG EC tablet TAKE 1 TABLET (81 MG TOTAL) BY MOUTH DAILY. 90 tablet 1   Blood Glucose Monitoring Suppl (ONETOUCH VERIO IQ SYSTEM) w/Device KIT Use to check blood sugar once to twice daily. 1 kit 0   Continuous Blood Gluc Sensor (DEXCOM G6 SENSOR) MISC UAD to check sugars E11.9 3 each 3   Continuous Blood Gluc Transmit (DEXCOM G6 TRANSMITTER) MISC UAD to check sugars  E11.9 1 each 0   fluticasone (FLONASE) 50 MCG/ACT nasal spray Place 2 sprays into both nostrils daily. 16 g 6   glipiZIDE (GLUCOTROL) 10 MG tablet TAKE 2 TABLETS BY MOUTH 2 TIMES DAILY BEFORE A MEAL. 360 tablet 0   HYDROcodone-acetaminophen (NORCO) 10-325 MG tablet SMARTSIG:1 Tablet(s) By Mouth 4-6 Times Daily     LINZESS 145 MCG CAPS capsule TAKE 1 CAPSULE BY MOUTH DAILY BEFORE BREAKFAST. 90 capsule 1   metFORMIN (GLUCOPHAGE-XR) 500 MG 24 hr tablet TAKE 2 TABLETS BY MOUTH 2 TIMES DAILY BEFORE A MEAL. 360  tablet 1   Misc. Devices (BARIATRIC ROLLATOR) MISC Use as directed.  Dx  M54.9 1 each 0   montelukast (SINGULAIR) 10 MG tablet TAKE 1 TABLET BY MOUTH EVERYDAY AT BEDTIME 90 tablet 1   RESTASIS 0.05 % ophthalmic emulsion      telmisartan (MICARDIS) 40 MG tablet TAKE 1 TABLET BY MOUTH EVERY DAY 90 tablet 1   traMADol (ULTRAM) 50 MG tablet PLEASE SEE ATTACHED FOR DETAILED DIRECTIONS     No current facility-administered medications on file prior to visit.     Review of Systems     Objective:  There were no vitals filed for this visit. BP Readings from Last 3 Encounters:  07/11/21 (!) 142/82  04/02/21 (!) 144/90  12/18/20 (!) 155/94   Wt Readings from Last 3 Encounters:  07/11/21 267 lb 9.6 oz (121.4 kg)  04/02/21 279 lb (126.6 kg)  08/06/20 274 lb (124.3 kg)   There is no height or weight on file to calculate BMI.    Physical Exam     Lab Results  Component Value Date   WBC 6.9 04/02/2021   HGB 13.4 04/02/2021   HCT 42.1 04/02/2021   PLT 307.0 04/02/2021   GLUCOSE 126 (H) 07/11/2021   CHOL 158 07/11/2021   TRIG 88.0 07/11/2021   HDL 35.90 (L) 07/11/2021  LDLCALC 105 (H) 07/11/2021   ALT 26 07/11/2021   AST 24 07/11/2021   NA 136 07/11/2021   K 3.7 07/11/2021   CL 98 07/11/2021   CREATININE 0.83 07/11/2021   BUN 13 07/11/2021   CO2 32 07/11/2021   TSH 1.84 08/06/2020   HGBA1C 7.7 (H) 07/11/2021   MICROALBUR 8.8 (H) 07/11/2021     Assessment & Plan:    See Problem List for Assessment and Plan of chronic medical problems.   This encounter was created in error - please disregard.

## 2022-01-05 NOTE — Patient Instructions (Signed)
     Blood work was ordered.     Medications changes include :   none   Your prescription(s) have been sent to your pharmacy.    A referral was ordered for [facility] for a colonoscopy.     Someone from that office will call you to schedule an appointment.    Return in about 6 months (around 07/09/2022) for Physical Exam.   [facility] Phone: [phone]  

## 2022-01-06 ENCOUNTER — Encounter: Payer: Medicare Other | Admitting: Internal Medicine

## 2022-01-09 ENCOUNTER — Ambulatory Visit: Payer: Medicare Other

## 2022-01-14 ENCOUNTER — Encounter: Payer: Self-pay | Admitting: Internal Medicine

## 2022-01-14 NOTE — Progress Notes (Signed)
      Subjective:    Patient ID: Lindsay Hardy, female    DOB: [birth year], 71 y.o.   MRN: [id number]     HPI Lindsay Hardy is here for follow up of her chronic medical problems, including DM w/ neuropathy, htn, hld, chronic constipation  Stopped ozempic - due to blurry vision - ? See eye doctor - wrosening of retinopathy?   ?  No longer taking telmisartan  Medications and allergies reviewed with patient and updated if appropriate.  Current Outpatient Medications on File Prior to Visit  Medication Sig Dispense Refill  . ibuprofen (ADVIL) 800 MG tablet TAKE 1 TABLET (800 MG TOTAL) BY MOUTH 2 (TWO) TIMES DAILY AS NEEDED FOR MODERATE PAIN. 180 tablet 0  . ACCU-CHEK GUIDE test strip USE UP TO FOUR TIMES DAILY AS DIRECTED 100 strip 5  . Accu-Chek Softclix Lancets lancets USE UP TO FOUR TIMES DAILY AS DIRECTED 100 each 1  . amLODipine (NORVASC) 10 MG tablet TAKE 1 TABLET BY MOUTH EVERY DAY 90 tablet 1  . aspirin 81 MG EC tablet TAKE 1 TABLET (81 MG TOTAL) BY MOUTH DAILY. 90 tablet 1  . Blood Glucose Monitoring Suppl (ONETOUCH VERIO IQ SYSTEM) w/Device KIT Use to check blood sugar once to twice daily. 1 kit 0  . fluticasone (FLONASE) 50 MCG/ACT nasal spray Place 2 sprays into both nostrils daily. 16 g 6  . HYDROcodone-acetaminophen (NORCO) 10-325 MG tablet SMARTSIG:1 Tablet(s) By Mouth 4-6 Times Daily    . LINZESS 145 MCG CAPS capsule TAKE 1 CAPSULE BY MOUTH DAILY BEFORE BREAKFAST. 90 capsule 1  . metFORMIN (GLUCOPHAGE-XR) 500 MG 24 hr tablet TAKE 2 TABLETS BY MOUTH 2 TIMES DAILY BEFORE A MEAL. 360 tablet 1  . Misc. Devices (BARIATRIC ROLLATOR) MISC Use as directed.  Dx  M54.9 1 each 0  . montelukast (SINGULAIR) 10 MG tablet TAKE 1 TABLET BY MOUTH EVERYDAY AT BEDTIME 90 tablet 1  . RESTASIS 0.05 % ophthalmic emulsion      No current facility-administered medications on file prior to visit.     Review of Systems     Objective:  There were no vitals filed for this visit. BP Readings from  Last 3 Encounters:  07/11/21 (!) 142/82  04/02/21 (!) 144/90  12/18/20 (!) 155/94   Wt Readings from Last 3 Encounters:  07/11/21 267 lb 9.6 oz (121.4 kg)  04/02/21 279 lb (126.6 kg)  08/06/20 274 lb (124.3 kg)   There is no height or weight on file to calculate BMI.    Physical Exam     Lab Results  Component Value Date   WBC 6.9 04/02/2021   HGB 13.4 04/02/2021   HCT 42.1 04/02/2021   PLT 307.0 04/02/2021   GLUCOSE 126 (H) 07/11/2021   CHOL 158 07/11/2021   TRIG 88.0 07/11/2021   HDL 35.90 (L) 07/11/2021   LDLCALC 105 (H) 07/11/2021   ALT 26 07/11/2021   AST 24 07/11/2021   NA 136 07/11/2021   K 3.7 07/11/2021   CL 98 07/11/2021   CREATININE 0.83 07/11/2021   BUN 13 07/11/2021   CO2 32 07/11/2021   TSH 1.84 08/06/2020   HGBA1C 7.7 (H) 07/11/2021   MICROALBUR 8.8 (H) 07/11/2021     Assessment & Plan:    See Problem List for Assessment and Plan of chronic medical problems.    This encounter was created in error - please disregard.

## 2022-01-14 NOTE — Patient Instructions (Addendum)
     Blood work was ordered.     Medications changes include :      Your prescription(s) have been sent to your pharmacy.    A referral was ordered for [facility].     Someone from that office will call you to schedule an appointment.    Return in about 4 months (around 05/17/2022) for follow up.

## 2022-01-15 ENCOUNTER — Encounter: Payer: Medicare Other | Admitting: Internal Medicine

## 2022-01-15 DIAGNOSIS — Z789 Other specified health status: Secondary | ICD-10-CM | POA: Insufficient documentation

## 2022-01-15 DIAGNOSIS — I10 Essential (primary) hypertension: Secondary | ICD-10-CM

## 2022-01-15 DIAGNOSIS — E1142 Type 2 diabetes mellitus with diabetic polyneuropathy: Secondary | ICD-10-CM

## 2022-01-15 DIAGNOSIS — K5909 Other constipation: Secondary | ICD-10-CM

## 2022-01-15 DIAGNOSIS — E1165 Type 2 diabetes mellitus with hyperglycemia: Secondary | ICD-10-CM

## 2022-01-15 DIAGNOSIS — G720 Drug-induced myopathy: Secondary | ICD-10-CM | POA: Insufficient documentation

## 2022-01-15 DIAGNOSIS — E782 Mixed hyperlipidemia: Secondary | ICD-10-CM

## 2022-01-15 NOTE — Assessment & Plan Note (Signed)
Chronic Blood pressure well controlled CMP Continue amlodipine 10 mg daily 

## 2022-01-15 NOTE — Assessment & Plan Note (Signed)
Chronic  Lab Results  Component Value Date   HGBA1C 7.7 (H) 07/11/2021   Sugars not ideally controlled, but improved when we checked it last secondary to being on Ozempic which she is off of Check A1c Continue glipizide 20 mg twice daily, metformin XR 1000 mg twice daily Stressed regular exercise, diabetic diet and weight loss

## 2022-01-15 NOTE — Assessment & Plan Note (Addendum)
Chronic Statin intolerance-has tried more than 1 statin and has not tolerated them Check lipid panel Encouraged healthy diet, regular exercise and weight loss

## 2022-01-15 NOTE — Assessment & Plan Note (Signed)
Chronic Not currently on any medication for the neuropathy Stressed good sugar control

## 2022-01-15 NOTE — Assessment & Plan Note (Signed)
Chronic °Controlled °Continue Linzess 145 mcg daily °

## 2022-01-17 DIAGNOSIS — M5431 Sciatica, right side: Secondary | ICD-10-CM | POA: Diagnosis not present

## 2022-01-17 DIAGNOSIS — M5136 Other intervertebral disc degeneration, lumbar region: Secondary | ICD-10-CM | POA: Diagnosis not present

## 2022-01-19 DIAGNOSIS — M5431 Sciatica, right side: Secondary | ICD-10-CM | POA: Diagnosis not present

## 2022-01-19 DIAGNOSIS — G894 Chronic pain syndrome: Secondary | ICD-10-CM | POA: Diagnosis not present

## 2022-01-19 DIAGNOSIS — M5432 Sciatica, left side: Secondary | ICD-10-CM | POA: Diagnosis not present

## 2022-01-19 DIAGNOSIS — M5136 Other intervertebral disc degeneration, lumbar region: Secondary | ICD-10-CM | POA: Diagnosis not present

## 2022-01-26 ENCOUNTER — Ambulatory Visit: Payer: Self-pay

## 2022-01-26 DIAGNOSIS — E1165 Type 2 diabetes mellitus with hyperglycemia: Secondary | ICD-10-CM

## 2022-01-26 NOTE — Patient Outreach (Signed)
  Care Coordination   Initial Visit Note   01/26/2022 Name: Kashari Chalmers MRN: [id number] DOB: [birth year]  Mesha Schamberger is a 71 y.o. year old female who sees Burns, Bobette Mo, MD for primary care. I spoke with  [name] by phone today  What matters to the patients health and wellness today?  Reports interested in obtaining information on meals on wheels. Declines any other care management, care coordination or disease management at this time.    Goals Addressed             This Visit's Progress    COMPLETED: interested in meals on wheels       Care Coordination Interventions: Care Guide referral for assistance with obtaining meals on wheels Care Gaps reviewed Discussed Annual wellness visit, benefits and encouraged patient to contact provider to schedule RNCM encouraged patient to contact RNCM if would like care management/disease management in the future.         SDOH assessments and interventions completed:  Yes  SDOH Interventions Today    Flowsheet Row Most Recent Value  SDOH Interventions   Food Insecurity Interventions --  [care guide-interested in meals on wheels]  Transportation Interventions Intervention Not Indicated        Care Coordination Interventions Activated:  Yes  Care Coordination Interventions:  Yes, provided   Follow up plan: No further intervention required.   Encounter Outcome:  Pt. Visit Completed   Kathyrn Sheriff, RN, MSN, BSN, CCM Care Coordinator [phone]

## 2022-01-28 ENCOUNTER — Telehealth: Payer: Self-pay

## 2022-01-28 NOTE — Telephone Encounter (Signed)
   Telephone encounter was:  Successful.  01/28/2022 Name: Rochele Lueck MRN: [id number] DOB: 1951/04/11  Milarose Savich is a 71 y.o. year old female who is a primary care patient of Burns, Bobette Mo, MD . The community resource team was consulted for assistance with Food Insecurity  Care guide performed the following interventions: Patient provided with information about care guide support team and interviewed to confirm resource needs Obtained verbal consent to place patient referral to Meals on Wheels Placed referral to Meals on Wheels via [id number] .  Follow Up Plan:  Care guide will follow up with patient by phone over the next 7 days.  Monae Topping, AAS Paralegal, [facility] Care Guide  Embedded Care Coordination [place]  [facility]  [address] [place], [place] [address] ??[email or web address]  ?? [phone]   [email or web address]

## 2022-01-29 ENCOUNTER — Telehealth: Payer: Self-pay

## 2022-01-29 NOTE — Telephone Encounter (Signed)
   Telephone encounter was:  Successful.  01/29/2022 Name: Lindsay Hardy MRN: [id number] DOB: Apr 07, 1951  Lindsay Hardy is a 71 y.o. year old female who is a primary care patient of Burns, Bobette Mo, MD . The community resource team was consulted for assistance with  meals on wheels referral.  Care guide performed the following interventions: Per [facility]/Alexis Sheffield Slider message from Meals [employer] Wheels client is enrolled into M[facility] and social work team will reach out shortly.   Spoke to patient to inform her to expect a call from a Meals on Astronomer.  Follow Up Plan:  No further follow up planned at this time. The patient has been provided with needed resources.  Cage Gupton, AAS Paralegal, [facility] Care Guide  Embedded Care Coordination [place]  [facility] Management  [address] [place], [place] [address] ??[email or web address]  ?? [phone]   [email or web address]

## 2022-02-06 DIAGNOSIS — Z794 Long term (current) use of insulin: Secondary | ICD-10-CM | POA: Diagnosis not present

## 2022-02-06 DIAGNOSIS — E1142 Type 2 diabetes mellitus with diabetic polyneuropathy: Secondary | ICD-10-CM | POA: Diagnosis not present

## 2022-02-16 DIAGNOSIS — G894 Chronic pain syndrome: Secondary | ICD-10-CM | POA: Diagnosis not present

## 2022-02-16 DIAGNOSIS — M792 Neuralgia and neuritis, unspecified: Secondary | ICD-10-CM | POA: Diagnosis not present

## 2022-02-16 DIAGNOSIS — R5382 Chronic fatigue, unspecified: Secondary | ICD-10-CM | POA: Diagnosis not present

## 2022-02-16 DIAGNOSIS — M48062 Spinal stenosis, lumbar region with neurogenic claudication: Secondary | ICD-10-CM | POA: Diagnosis not present

## 2022-02-16 DIAGNOSIS — M5431 Sciatica, right side: Secondary | ICD-10-CM | POA: Diagnosis not present

## 2022-02-16 DIAGNOSIS — I119 Hypertensive heart disease without heart failure: Secondary | ICD-10-CM | POA: Diagnosis not present

## 2022-02-16 DIAGNOSIS — M5432 Sciatica, left side: Secondary | ICD-10-CM | POA: Diagnosis not present

## 2022-02-16 DIAGNOSIS — M4726 Other spondylosis with radiculopathy, lumbar region: Secondary | ICD-10-CM | POA: Diagnosis not present

## 2022-02-16 DIAGNOSIS — M4807 Spinal stenosis, lumbosacral region: Secondary | ICD-10-CM | POA: Diagnosis not present

## 2022-02-16 DIAGNOSIS — M5136 Other intervertebral disc degeneration, lumbar region: Secondary | ICD-10-CM | POA: Diagnosis not present

## 2022-02-16 DIAGNOSIS — G8929 Other chronic pain: Secondary | ICD-10-CM | POA: Diagnosis not present

## 2022-02-16 DIAGNOSIS — M79609 Pain in unspecified limb: Secondary | ICD-10-CM | POA: Diagnosis not present

## 2022-02-16 DIAGNOSIS — M47897 Other spondylosis, lumbosacral region: Secondary | ICD-10-CM | POA: Diagnosis not present

## 2022-02-18 ENCOUNTER — Other Ambulatory Visit: Payer: Self-pay | Admitting: Internal Medicine

## 2022-02-19 ENCOUNTER — Telehealth: Payer: Self-pay | Admitting: Internal Medicine

## 2022-02-19 NOTE — Telephone Encounter (Signed)
LVM for pt to rtn my call to schedule AWV with [facility] call back # [phone] 

## 2022-03-05 ENCOUNTER — Ambulatory Visit (INDEPENDENT_AMBULATORY_CARE_PROVIDER_SITE_OTHER): Payer: Medicare Other

## 2022-03-05 DIAGNOSIS — Z135 Encounter for screening for eye and ear disorders: Secondary | ICD-10-CM | POA: Diagnosis not present

## 2022-03-05 DIAGNOSIS — Z0000 Encounter for general adult medical examination without abnormal findings: Secondary | ICD-10-CM

## 2022-03-05 NOTE — Progress Notes (Signed)
Virtual Visit via Telephone Note  I connected with  Lindsay Hardy on 03/05/22 at  3:15 PM EDT by telephone and verified that I am speaking with the correct person using two identifiers.  Location: Patient: Home Provider: [place] Persons participating in the virtual visit: [place]   I discussed the limitations, risks, security and privacy concerns of performing an evaluation and management service by telephone and the availability of in person appointments. The patient expressed understanding and agreed to proceed.  Interactive audio and video telecommunications were attempted between this nurse and patient, however failed, due to patient having technical difficulties OR patient did not have access to video capability.  We continued and completed visit with audio only.  Some vital signs may be absent or patient reported.   Sheral Flow, LPN  Subjective:   Lindsay Hardy is a 71 y.o. female who presents for Medicare Annual (Subsequent) preventive examination.  Review of Systems     Cardiac Risk Factors include: advanced age (>65mn, >>63women);diabetes mellitus;hypertension;obesity (BMI >30kg/m2);sedentary lifestyle;family history of premature cardiovascular disease     Objective:    Today's Vitals   03/05/22 1518  PainSc: 6    There is no height or weight on file to calculate BMI.     03/05/2022    3:23 PM 10/14/2020    3:52 PM 02/01/2018    5:18 PM 02/17/2017    2:27 PM 02/28/2015    3:10 PM 10/15/2014    1:50 PM 09/06/2014    1:47 PM  Advanced Directives  Does Patient Have a Medical Advance Directive? No No No No Yes No No  Copy of Healthcare Power of Attorney in Chart?     Yes    Would patient like information on creating a medical advance directive? No - Patient declined No - Patient declined No - Patient declined No - Patient declined  No - patient declined information No - patient declined information    Current Medications  (verified) Outpatient Encounter Medications as of 03/05/2022  Medication Sig   ibuprofen (ADVIL) 800 MG tablet TAKE 1 TABLET (800 MG TOTAL) BY MOUTH 2 (TWO) TIMES DAILY AS NEEDED FOR MODERATE PAIN.   ACCU-CHEK GUIDE test strip USE UP TO FOUR TIMES DAILY AS DIRECTED   Accu-Chek Softclix Lancets lancets USE UP TO FOUR TIMES DAILY AS DIRECTED   albuterol (VENTOLIN HFA) 108 (90 Base) MCG/ACT inhaler INHALE 2 PUFFS INTO THE LUNGS EVERY 4 (FOUR) HOURS AS NEEDED FOR SHORTNESS OF BREATH   amLODipine (NORVASC) 10 MG tablet TAKE 1 TABLET BY MOUTH EVERY DAY   aspirin 81 MG EC tablet TAKE 1 TABLET (81 MG TOTAL) BY MOUTH DAILY.   Blood Glucose Monitoring Suppl (ONETOUCH VERIO IQ SYSTEM) w/Device KIT Use to check blood sugar once to twice daily.   fluticasone (FLONASE) 50 MCG/ACT nasal spray Place 2 sprays into both nostrils daily.   glipiZIDE (GLUCOTROL) 10 MG tablet TAKE 2 TABLETS BY MOUTH 2 TIMES DAILY BEFORE A MEAL.   HYDROcodone-acetaminophen (NORCO) 10-325 MG tablet SMARTSIG:1 Tablet(s) By Mouth 4-6 Times Daily   LINZESS 145 MCG CAPS capsule TAKE 1 CAPSULE BY MOUTH DAILY BEFORE BREAKFAST.   metFORMIN (GLUCOPHAGE-XR) 500 MG 24 hr tablet TAKE 2 TABLETS BY MOUTH 2 TIMES DAILY BEFORE A MEAL.   Misc. Devices (BARIATRIC ROLLATOR) MISC Use as directed.  Dx  M54.9   montelukast (SINGULAIR) 10 MG tablet TAKE 1 TABLET BY MOUTH EVERYDAY AT BEDTIME   RESTASIS 0.05 % ophthalmic emulsion    [  DISCONTINUED] telmisartan (MICARDIS) 40 MG tablet TAKE 1 TABLET BY MOUTH EVERY DAY (Patient not taking: Reported on 03/05/2022)   No facility-administered encounter medications on file as of 03/05/2022.    Allergies (verified) Atorvastatin, Clopidogrel bisulfate, Crestor [rosuvastatin calcium], Fexofenadine, Furosemide, Gabapentin, Hydrochlorothiazide w-triamterene, Lisinopril, Metoprolol tartrate, Mometasone furoate, Telmisartan, and Ozempic (0.25 or 0.5 mg-dose) [semaglutide(0.25 or 0.64m-dos)]   History: Past Medical  History:  Diagnosis Date   Asthma    CAD (coronary artery disease) 11/2003    RCA 60% stenosis, LAD diffuse   Chicken pox    CVA (cerebrovascular accident) ([place]    right frontoparietal cortical CVA - MRI September 2001   Diabetes mellitus    Diastolic dysfunction     ejection fraction 55-65% on 2-D echo May 2006   Empty sella syndrome ([facility]     based on MRI September 2001, related to obesity, hypertension, it was determined that patient has not required treatment but will be monitored with TSH, ACTH, cortisol, testosterone, prolactin, growth hormone   GERD (gastroesophageal reflux disease)     EGD June 2004 -  followed by Dr. GCarlean Purl  Granulomatous disease, chronic ([place]     in right upper lobe per x-ray December 2005   Hyperlipidemia    Hypertension    NAFLD (nonalcoholic fatty liver disease)     based on ultrasound June 2002, elevated transaminase   OA (osteoarthritis)     DJP coracoclavicular ligament, left patella osteophytes June 2002, L4-S1 spondylosis, degenerative disc disease with bulge. I in October 2001, the C2-C4 spondylosis without stenosis and with spurs based on x-ray July 2005; diffuse idiopathic skeletal hyperostosis with bilateral hip lumbar degenerative changes   on x-ray February 2005, bilateral plantar calcaneal spurs   Reactive airway disease     peak flow 180 in May 2005, chronic sinusitis with PND, bronchitis December 2002 and strep pneumonia April 2007,  FEC 66, MCV 60, ratio is 69, DLCO 57,  moderate restrictive and mild obstructive disease  on spirometry May 2005   Past Surgical History:  Procedure Laterality Date   CARPAL [place]   left arm 1992   CHOLECYSTECTOMY  2003   TONSILLECTOMY AND ADENOIDECTOMY     Family History  Problem Relation Age of Onset   Lung cancer Mother    Hypertension Mother    Lung cancer Sister    Hypertension Sister    Social History   Socioeconomic History   Marital status: Single    Spouse name: Not  on file   Number of children: 1   Years of education: 14   Highest education level: Not on file  Occupational History   Occupation:  former nPsychologist, counselling Tobacco Use   Smoking status: Former    Packs/day: 0.25    Years: 4.00    Total pack years: 1.00    Types: Cigarettes   Smokeless tobacco: Never   Tobacco comments:    smoked in 20's   Vaping Use   Vaping Use: Never used  Substance and Sexual Activity   Alcohol use: No    Alcohol/week: 0.0 standard drinks of alcohol   Drug use: No   Sexual activity: Not Currently    Birth control/protection: Post-menopausal  Other Topics Concern   Not on file  Social History Narrative   [place] Currently lives in a private residence with her nephew and his wife. Fun: Church activities, spending time with family   Denies any religious  beliefs that would effect health care.    Social Determinants of Health   Financial Resource Strain: Low Risk  (03/05/2022)   Overall Financial Resource Strain (CARDIA)    Difficulty of Paying Living Expenses: Not hard at all  Food Insecurity: No Food Insecurity (03/05/2022)   Hunger Vital Sign    Worried About Running Out of Food in the Last Year: Never true    Ran Out of Food in the Last Year: Never true  Recent Concern: Food Insecurity - Food Insecurity Present (01/28/2022)   Hunger Vital Sign    Worried About Running Out of Food in the Last Year: Never true    Ran Out of Food in the Last Year: Sometimes true  Transportation Needs: No Transportation Needs (03/05/2022)   PRAPARE - Hydrologist (Medical): No    Lack of Transportation (Non-Medical): No  Physical Activity: Inactive (03/05/2022)   Exercise Vital Sign    Days of Exercise per Week: 0 days    Minutes of Exercise per Session: 0 min  Stress: No Stress Concern Present (03/05/2022)   [place]    Feeling of Stress : Not at  all  Social Connections: Moderately Integrated (03/05/2022)   Social Connection and Isolation Panel [NHANES]    Frequency of Communication with Friends and Family: More than three times a week    Frequency of Social Gatherings with Friends and Family: More than three times a week    Attends Religious Services: More than 4 times per year    Active Member of [employer] or Organizations: Yes    Attends Music therapist: More than 4 times per year    Marital Status: Never married    Tobacco Counseling Counseling given: Not Answered Tobacco comments: smoked in 20's    Clinical Intake:  Pre-visit preparation completed: Yes  Pain : 0-10 Pain Score: 6  Pain Type: Chronic pain Pain Location: Back Pain Orientation: Lower     BMI - recorded: 44.53 (07/11/2021) Nutritional Risks: None Diabetes: No  How often do you need to have someone help you when you read instructions, pamphlets, or other written materials from your doctor or pharmacy?: 1 - Never What is the last grade level you completed in school?: HSG; SOME COLLEGE  Nutrition Risk Assessment:  Has the patient had any N/V/D within the last 2 months?  No  Does the patient have any non-healing wounds?  No  Has the patient had any unintentional weight loss or weight gain?  No   Diabetes:  Is the patient diabetic?  Yes  If diabetic, was a CBG obtained today?  No  Did the patient bring in their glucometer from home?  No  How often do you monitor your CBG's? Dexcom Continuous Monitor.   Financial Strains and Diabetes Management:  Are you having any financial strains with the device, your supplies or your medication? No .  Does the patient want to be seen by Chronic Care Management for management of their diabetes?  No  Would the patient like to be referred to a Nutritionist or for Diabetic Management?  No   Diabetic Exams:  Diabetic Eye Exam: Completed 06/19/2019. Overdue for diabetic eye exam. Pt has been advised about  the importance in completing this exam. A referral has been placed today. Message sent to referral coordinator for scheduling purposes. Advised pt to expect a call from office referred to regarding appt.  Diabetic Foot Exam:  Completed 11/01/2019. Pt has been advised about the importance in completing this exam. Pt is scheduled for diabetic foot exam on 05/2022.    Interpreter Needed?: No  Information entered by :: Lisette Abu, LPN.   Activities of Daily Living    03/05/2022    3:26 PM 09/04/2021   12:41 PM  In your present state of health, do you have any difficulty performing the following activities:  Hearing? 0 0  Vision? 0 1  Difficulty concentrating or making decisions? 0   Walking or climbing stairs? 0 1  Dressing or bathing? 0   Doing errands, shopping? 0 1  Preparing Food and eating ? N Y  Using the Toilet? N Y  In the past six months, have you accidently leaked urine? N Y  Do you have problems with loss of bowel control? N N  Managing your Medications? N N  Managing your Finances? N N  Housekeeping or managing your Housekeeping? N Y    Patient Care Team: Binnie Rail, MD as PCP - General (Internal Medicine) [facility] as Consulting Physician (Ophthalmology)  Indicate any recent Medical Services you may have received from other than [facility] providers in the past year (date may be approximate).     Assessment:   This is a routine wellness examination for [place].  Hearing/Vision screen Hearing Screening - Comments:: Denies hearing difficulties   Vision Screening - Comments:: Wears rx glasses - up to date with routine eye exams with [facility]   Dietary issues and exercise activities discussed: Current Exercise Habits: The patient does not participate in regular exercise at present   Goals Addressed             This Visit's Progress    MY GOAL IS TO GET RID OF MY BODY PAIN.        Depression Screen    03/05/2022    3:26 PM  04/02/2021    4:47 PM 11/01/2019    2:26 PM 02/01/2018    5:18 PM 04/01/2017   11:28 AM 02/17/2017    2:28 PM 03/25/2016   10:27 AM  PHQ 2/9 Scores  PHQ - 2 Score 0 0 0 4 0 0 0  PHQ- 9 Score  0  12       Fall Risk    03/05/2022    3:23 PM 09/04/2021   12:41 PM 04/02/2021    3:12 PM 11/01/2019    2:25 PM 02/01/2018    5:18 PM  Fall Risk   Falls in the past year? 0 1 0 1 Yes  Number falls in past yr: 0 0 0 0 2 or more  Injury with Fall? 0 0 0 0 No  Risk for fall due to : No Fall Risks  No Fall Risks  Impaired mobility;Impaired balance/gait  Follow up Falls prevention discussed  Falls evaluation completed Falls evaluation completed Falls prevention discussed    FALL RISK PREVENTION PERTAINING TO THE HOME:  Any stairs in or around the home? No  If so, are there any without handrails? No  Home free of loose throw rugs in walkways, pet beds, electrical cords, etc? Yes  Adequate lighting in your home to reduce risk of falls? Yes   ASSISTIVE DEVICES UTILIZED TO PREVENT FALLS:  Life alert? No  Use of a cane, walker or w/c? Yes  Grab bars in the bathroom? Yes  Shower chair or bench in shower? No  Elevated toilet seat or a handicapped toilet? No  TIMED UP AND GO:  Was the test performed? No . Phone Visit  Cognitive Function:        03/05/2022    3:40 PM  6CIT Screen  What Year? 0 points  What month? 0 points  What time? 0 points  Count back from 20 0 points  Months in reverse 0 points  Repeat phrase 0 points  Total Score 0 points    Immunizations Immunization History  Administered Date(s) Administered   Fluad Quad(high Dose 65+) 04/07/2019   Influenza Whole 04/15/2006, 04/20/2007, 03/15/2008   Influenza, High Dose Seasonal PF 07/22/2018   PFIZER(Purple Top)SARS-COV-2 Vaccination 04/19/2020, 05/10/2020    TDAP status: Due, Education has been provided regarding the importance of this vaccine. Advised may receive this vaccine at local pharmacy or Health Dept. Aware  to provide a copy of the vaccination record if obtained from local pharmacy or Health Dept. Verbalized acceptance and understanding.  Flu Vaccine status: Declined, Education has been provided regarding the importance of this vaccine but patient still declined. Advised may receive this vaccine at local pharmacy or Health Dept. Aware to provide a copy of the vaccination record if obtained from local pharmacy or Health Dept. Verbalized acceptance and understanding.  Pneumococcal vaccine status: Declined,  Education has been provided regarding the importance of this vaccine but patient still declined. Advised may receive this vaccine at local pharmacy or Health Dept. Aware to provide a copy of the vaccination record if obtained from local pharmacy or Health Dept. Verbalized acceptance and understanding.   Covid-19 vaccine status: Completed vaccines  Qualifies for Shingles Vaccine? Yes   Zostavax completed No   Shingrix Completed?: No.    Education has been provided regarding the importance of this vaccine. Patient has been advised to call insurance company to determine out of pocket expense if they have not yet received this vaccine. Advised may also receive vaccine at local pharmacy or Health Dept. Verbalized acceptance and understanding.  Screening Tests Health Maintenance  Topic Date Due   OPHTHALMOLOGY EXAM  06/18/2020   FOOT EXAM  10/31/2020   HEMOGLOBIN A1C  01/08/2022   COVID-19 Vaccine (3 - Pfizer series) 03/21/2022 (Originally 07/05/2020)   Pneumonia Vaccine 71+ Years old (1 - PCV) 04/02/2022 (Originally 05/30/2014)   Zoster Vaccines- Shingrix (1 of 2) 06/04/2022 (Originally 05/31/1999)   INFLUENZA VACCINE  09/06/2022 (Originally 01/06/2022)   MAMMOGRAM  03/06/2023 (Originally 03/24/2014)   TETANUS/TDAP  03/06/2023 (Originally 05/30/1968)   Diabetic kidney evaluation - GFR measurement  07/11/2022   Diabetic kidney evaluation - Urine ACR  07/11/2022   Hepatitis C Screening  Completed    HPV VACCINES  Aged Out   DEXA SCAN  Discontinued   COLONOSCOPY (Pts 45-41yr Insurance coverage will need to be confirmed)  Discontinued    Health Maintenance  Health Maintenance Due  Topic Date Due   OPHTHALMOLOGY EXAM  06/18/2020   FOOT EXAM  10/31/2020   HEMOGLOBIN A1C  01/08/2022    Colorectal cancer screening: No longer required.  Patient declined  Mammogram status: No longer required due to patient declined.  Bone Density status: patient declined  Lung Cancer Screening: (Low Dose CT Chest recommended if Age 73-80years, 30 pack-year currently smoking OR have quit w/in 15years.) does not qualify.   Lung Cancer Screening Referral: no  Additional Screening:  Hepatitis C Screening: does qualify; Completed 03/25/2016  Vision Screening: Recommended annual ophthalmology exams for early detection of glaucoma and other disorders of the eye. Is the patient up to date with  their annual eye exam?  No  Who is the provider or what is the name of the office in which the patient attends annual eye exams? [facility] If pt is not established with a provider, would they like to be referred to a provider to establish care? Yes .   Dental Screening: Recommended annual dental exams for proper oral hygiene  Community Resource Referral / Chronic Care Management: CRR required this visit?  Yes   CCM required this visit?  No      Plan:     I have personally reviewed and noted the following in the patient's chart:   Medical and social history Use of alcohol, tobacco or illicit drugs  Current medications and supplements including opioid prescriptions. Patient is currently taking opioid prescriptions. Information provided to patient regarding non-opioid alternatives. Patient advised to discuss non-opioid treatment plan with their provider. Functional ability and status Nutritional status Physical activity Advanced directives List of other physicians Hospitalizations, surgeries, and  ER visits in previous 12 months Vitals Screenings to include cognitive, depression, and falls Referrals and appointments  In addition, I have reviewed and discussed with patient certain preventive protocols, quality metrics, and best practice recommendations. A written personalized care plan for preventive services as well as general preventive health recommendations were provided to patient.     Sheral Flow, LPN   6/38/7564   Nurse Notes: N/A

## 2022-03-05 NOTE — Patient Instructions (Signed)
Lindsay Hardy , Thank you for taking time to come for your Medicare Wellness Visit. I appreciate your ongoing commitment to your health goals. Please review the following plan we discussed and let me know if I can assist you in the future.   These are the goals we discussed:  Goals      MY GOAL IS TO GET RID OF MY BODY PAIN.        This is a list of the screening recommended for you and due dates:  Health Maintenance  Topic Date Due   Eye exam for diabetics  06/18/2020   Complete foot exam   10/31/2020   Hemoglobin A1C  01/08/2022   COVID-19 Vaccine (3 - Pfizer series) 03/21/2022*   Pneumonia Vaccine (1 - PCV) 04/02/2022*   Zoster (Shingles) Vaccine (1 of 2) 06/04/2022*   Flu Shot  09/06/2022*   Mammogram  03/06/2023*   Tetanus Vaccine  03/06/2023*   Yearly kidney function blood test for diabetes  07/11/2022   Yearly kidney health urinalysis for diabetes  07/11/2022   Hepatitis C Screening: USPSTF Recommendation to screen - Ages 18-79 yo.  Completed   HPV Vaccine  Aged Out   DEXA scan (bone density measurement)  Discontinued   Colon Cancer Screening  Discontinued  *Topic was postponed. The date shown is not the original due date.    Advanced directives: No  Conditions/risks identified: Yes; Type 2 Diabetes Mellitus  Next appointment: Follow up in one year for your annual wellness visit by calling [phone] to schedule with Nurse Mignon Pine.   Preventive Care 75 Years and Older, Female Preventive care refers to lifestyle choices and visits with your health care provider that can promote health and wellness. What does preventive care include? A yearly physical exam. This is also called an annual well check. Dental exams once or twice a year. Routine eye exams. Ask your health care provider how often you should have your eyes checked. Personal lifestyle choices, including: Daily care of your teeth and gums. Regular physical activity. Eating a healthy diet. Avoiding tobacco  and drug use. Limiting alcohol use. Practicing safe sex. Taking low-dose aspirin every day. Taking vitamin and mineral supplements as recommended by your health care provider. What happens during an annual well check? The services and screenings done by your health care provider during your annual well check will depend on your age, overall health, lifestyle risk factors, and family history of disease. Counseling  Your health care provider may ask you questions about your: Alcohol use. Tobacco use. Drug use. Emotional well-being. Home and relationship well-being. Sexual activity. Eating habits. History of falls. Memory and ability to understand (cognition). Work and work Statistician. Reproductive health. Screening  You may have the following tests or measurements: Height, weight, and BMI. Blood pressure. Lipid and cholesterol levels. These may be checked every 5 years, or more frequently if you are over 25 years old. Skin check. Lung cancer screening. You may have this screening every year starting at age 2 if you have a 30-pack-year history of smoking and currently smoke or have quit within the past 15 years. Fecal occult blood test (FOBT) of the stool. You may have this test every year starting at age 33. Flexible sigmoidoscopy or colonoscopy. You may have a sigmoidoscopy every 5 years or a colonoscopy every 10 years starting at age 32. Hepatitis C blood test. Hepatitis B blood test. Sexually transmitted disease (STD) testing. Diabetes screening. This is done by checking your blood sugar (glucose) after  you have not eaten for a while (fasting). You may have this done every 1-3 years. Bone density scan. This is done to screen for osteoporosis. You may have this done starting at age 83. Mammogram. This may be done every 1-2 years. Talk to your health care provider about how often you should have regular mammograms. Talk with your health care provider about your test results,  treatment options, and if necessary, the need for more tests. Vaccines  Your health care provider may recommend certain vaccines, such as: Influenza vaccine. This is recommended every year. Tetanus, diphtheria, and acellular pertussis (Tdap, Td) vaccine. You may need a Td booster every 10 years. Zoster vaccine. You may need this after age 61. Pneumococcal 13-valent conjugate (PCV13) vaccine. One dose is recommended after age 20. Pneumococcal polysaccharide (PPSV23) vaccine. One dose is recommended after age 56. Talk to your health care provider about which screenings and vaccines you need and how often you need them. This information is not intended to replace advice given to you by your health care provider. Make sure you discuss any questions you have with your health care provider. Document Released: 06/21/2015 Document Revised: 02/12/2016 Document Reviewed: 03/26/2015 Elsevier Interactive Patient Education  2017 [place] Prevention in the Home Falls can cause injuries. They can happen to people of all ages. There are many things you can do to make your home safe and to help prevent falls. What can I do on the outside of my home? Regularly fix the edges of walkways and driveways and fix any cracks. Remove anything that might make you trip as you walk through a door, such as a raised step or threshold. Trim any bushes or trees on the path to your home. Use bright outdoor lighting. Clear any walking paths of anything that might make someone trip, such as rocks or tools. Regularly check to see if handrails are loose or broken. Make sure that both sides of any steps have handrails. Any raised decks and porches should have guardrails on the edges. Have any leaves, snow, or ice cleared regularly. Use sand or salt on walking paths during winter. Clean up any spills in your garage right away. This includes oil or grease spills. What can I do in the bathroom? Use night  lights. Install grab bars by the toilet and in the tub and shower. Do not use towel bars as grab bars. Use non-skid mats or decals in the tub or shower. If you need to sit down in the shower, use a plastic, non-slip stool. Keep the floor dry. Clean up any water that spills on the floor as soon as it happens. Remove soap buildup in the tub or shower regularly. Attach bath mats securely with double-sided non-slip rug tape. Do not have throw rugs and other things on the floor that can make you trip. What can I do in the bedroom? Use night lights. Make sure that you have a light by your bed that is easy to reach. Do not use any sheets or blankets that are too big for your bed. They should not hang down onto the floor. Have a firm chair that has side arms. You can use this for support while you get dressed. Do not have throw rugs and other things on the floor that can make you trip. What can I do in the kitchen? Clean up any spills right away. Avoid walking on wet floors. Keep items that you use a lot in easy-to-reach places. If you  need to reach something above you, use a strong step stool that has a grab bar. Keep electrical cords out of the way. Do not use floor polish or wax that makes floors slippery. If you must use wax, use non-skid floor wax. Do not have throw rugs and other things on the floor that can make you trip. What can I do with my stairs? Do not leave any items on the stairs. Make sure that there are handrails on both sides of the stairs and use them. Fix handrails that are broken or loose. Make sure that handrails are as long as the stairways. Check any carpeting to make sure that it is firmly attached to the stairs. Fix any carpet that is loose or worn. Avoid having throw rugs at the top or bottom of the stairs. If you do have throw rugs, attach them to the floor with carpet tape. Make sure that you have a light switch at the top of the stairs and the bottom of the stairs. If  you do not have them, ask someone to add them for you. What else can I do to help prevent falls? Wear shoes that: Do not have high heels. Have rubber bottoms. Are comfortable and fit you well. Are closed at the toe. Do not wear sandals. If you use a stepladder: Make sure that it is fully opened. Do not climb a closed stepladder. Make sure that both sides of the stepladder are locked into place. Ask someone to hold it for you, if possible. Clearly mark and make sure that you can see: Any grab bars or handrails. First and last steps. Where the edge of each step is. Use tools that help you move around (mobility aids) if they are needed. These include: Canes. Walkers. Scooters. Crutches. Turn on the lights when you go into a dark area. Replace any light bulbs as soon as they burn out. Set up your furniture so you have a clear path. Avoid moving your furniture around. If any of your floors are uneven, fix them. If there are any pets around you, be aware of where they are. Review your medicines with your doctor. Some medicines can make you feel dizzy. This can increase your chance of falling. Ask your doctor what other things that you can do to help prevent falls. This information is not intended to replace advice given to you by your health care provider. Make sure you discuss any questions you have with your health care provider. Document Released: 03/21/2009 Document Revised: 10/31/2015 Document Reviewed: 06/29/2014 Elsevier Interactive Patient Education  2017 [employer].

## 2022-03-15 ENCOUNTER — Other Ambulatory Visit: Payer: Self-pay | Admitting: Internal Medicine

## 2022-03-16 DIAGNOSIS — M5432 Sciatica, left side: Secondary | ICD-10-CM | POA: Diagnosis not present

## 2022-03-16 DIAGNOSIS — M5431 Sciatica, right side: Secondary | ICD-10-CM | POA: Diagnosis not present

## 2022-03-16 DIAGNOSIS — G894 Chronic pain syndrome: Secondary | ICD-10-CM | POA: Diagnosis not present

## 2022-03-16 DIAGNOSIS — M5136 Other intervertebral disc degeneration, lumbar region: Secondary | ICD-10-CM | POA: Diagnosis not present

## 2022-03-18 ENCOUNTER — Telehealth: Payer: Self-pay | Admitting: Internal Medicine

## 2022-03-18 DIAGNOSIS — M5136 Other intervertebral disc degeneration, lumbar region: Secondary | ICD-10-CM | POA: Diagnosis not present

## 2022-03-18 DIAGNOSIS — M5431 Sciatica, right side: Secondary | ICD-10-CM | POA: Diagnosis not present

## 2022-03-18 NOTE — Telephone Encounter (Signed)
Patient would like you to call her Ozempic in to [facility] on [address], [place]

## 2022-03-27 ENCOUNTER — Other Ambulatory Visit: Payer: Self-pay | Admitting: Internal Medicine

## 2022-04-02 ENCOUNTER — Other Ambulatory Visit: Payer: Self-pay | Admitting: Internal Medicine

## 2022-04-13 ENCOUNTER — Other Ambulatory Visit: Payer: Self-pay | Admitting: Internal Medicine

## 2022-04-13 DIAGNOSIS — G894 Chronic pain syndrome: Secondary | ICD-10-CM | POA: Diagnosis not present

## 2022-04-13 DIAGNOSIS — M5431 Sciatica, right side: Secondary | ICD-10-CM | POA: Diagnosis not present

## 2022-04-13 DIAGNOSIS — M170 Bilateral primary osteoarthritis of knee: Secondary | ICD-10-CM | POA: Diagnosis not present

## 2022-04-13 DIAGNOSIS — M5136 Other intervertebral disc degeneration, lumbar region: Secondary | ICD-10-CM | POA: Diagnosis not present

## 2022-04-13 DIAGNOSIS — M5432 Sciatica, left side: Secondary | ICD-10-CM | POA: Diagnosis not present

## 2022-04-17 ENCOUNTER — Other Ambulatory Visit: Payer: Self-pay | Admitting: Internal Medicine

## 2022-04-17 DIAGNOSIS — M5136 Other intervertebral disc degeneration, lumbar region: Secondary | ICD-10-CM | POA: Diagnosis not present

## 2022-05-05 ENCOUNTER — Encounter: Payer: Self-pay | Admitting: Internal Medicine

## 2022-05-05 NOTE — Patient Instructions (Addendum)
      Blood work was ordered.   The lab is on the first floor.    Medications changes include :   mounjaro 2.5 mg weekly, diflucan 150 mg every 72 hrs x 3 doses     Return in about 6 months (around 11/04/2022) for follow up.

## 2022-05-05 NOTE — Progress Notes (Unsigned)
Subjective:    Patient ID: Lindsay Hardy, female    DOB: 1951-04-12, 71 y.o.   MRN: [id number]     HPI Lindsay Hardy is here for follow up of her chronic medical problems, including DM with neuropathy, htn, hld, chronic constipation  Itching and swelling in vaginal area  Medications and allergies reviewed with patient and updated if appropriate.  Current Outpatient Medications on File Prior to Visit  Medication Sig Dispense Refill   ibuprofen (ADVIL) 800 MG tablet TAKE 1 TABLET (800 MG TOTAL) BY MOUTH 2 (TWO) TIMES DAILY AS NEEDED FOR MODERATE PAIN. 180 tablet 0   ACCU-CHEK GUIDE test strip USE UP TO FOUR TIMES DAILY AS DIRECTED 100 strip 5   Accu-Chek Softclix Lancets lancets USE UP TO FOUR TIMES DAILY AS DIRECTED 100 each 1   albuterol (VENTOLIN HFA) 108 (90 Base) MCG/ACT inhaler INHALE 2 PUFFS INTO THE LUNGS EVERY 4 (FOUR) HOURS AS NEEDED FOR SHORTNESS OF BREATH 6.7 each 11   amLODipine (NORVASC) 10 MG tablet TAKE 1 TABLET BY MOUTH EVERY DAY 90 tablet 1   aspirin 81 MG EC tablet TAKE 1 TABLET (81 MG TOTAL) BY MOUTH DAILY. 90 tablet 1   Blood Glucose Monitoring Suppl (ONETOUCH VERIO IQ SYSTEM) w/Device KIT Use to check blood sugar once to twice daily. 1 kit 0   fluticasone (FLONASE) 50 MCG/ACT nasal spray Place 2 sprays into both nostrils daily. 16 g 6   glipiZIDE (GLUCOTROL) 10 MG tablet TAKE 2 TABLETS BY MOUTH 2 TIMES DAILY BEFORE A MEAL. 360 tablet 0   HYDROcodone-acetaminophen (NORCO) 10-325 MG tablet SMARTSIG:1 Tablet(s) By Mouth 4-6 Times Daily     LINZESS 145 MCG CAPS capsule TAKE 1 CAPSULE BY MOUTH DAILY BEFORE BREAKFAST. 90 capsule 1   metFORMIN (GLUCOPHAGE-XR) 500 MG 24 hr tablet TAKE 2 TABLETS BY MOUTH 2 TIMES DAILY BEFORE A MEAL. 360 tablet 1   Misc. Devices (BARIATRIC ROLLATOR) MISC Use as directed.  Dx  M54.9 1 each 0   montelukast (SINGULAIR) 10 MG tablet TAKE 1 TABLET BY MOUTH EVERYDAY AT BEDTIME 90 tablet 1   RESTASIS 0.05 % ophthalmic emulsion      No current  facility-administered medications on file prior to visit.     Review of Systems     Objective:  There were no vitals filed for this visit. BP Readings from Last 3 Encounters:  07/11/21 (!) 142/82  04/02/21 (!) 144/90  12/18/20 (!) 155/94   Wt Readings from Last 3 Encounters:  07/11/21 267 lb 9.6 oz (121.4 kg)  04/02/21 279 lb (126.6 kg)  08/06/20 274 lb (124.3 kg)   There is no height or weight on file to calculate BMI.    Physical Exam     Lab Results  Component Value Date   WBC 6.9 04/02/2021   HGB 13.4 04/02/2021   HCT 42.1 04/02/2021   PLT 307.0 04/02/2021   GLUCOSE 126 (H) 07/11/2021   CHOL 158 07/11/2021   TRIG 88.0 07/11/2021   HDL 35.90 (L) 07/11/2021   LDLCALC 105 (H) 07/11/2021   ALT 26 07/11/2021   AST 24 07/11/2021   NA 136 07/11/2021   K 3.7 07/11/2021   CL 98 07/11/2021   CREATININE 0.83 07/11/2021   BUN 13 07/11/2021   CO2 32 07/11/2021   TSH 1.84 08/06/2020   HGBA1C 7.7 (H) 07/11/2021   MICROALBUR 8.8 (H) 07/11/2021     Assessment & Plan:    See Problem List for Assessment and Plan  of chronic medical problems.

## 2022-05-06 ENCOUNTER — Ambulatory Visit (INDEPENDENT_AMBULATORY_CARE_PROVIDER_SITE_OTHER): Payer: Medicare Other | Admitting: Internal Medicine

## 2022-05-06 VITALS — BP 150/76 | HR 100 | Temp 97.9°F | Ht 65.0 in | Wt 266.0 lb

## 2022-05-06 DIAGNOSIS — B3731 Acute candidiasis of vulva and vagina: Secondary | ICD-10-CM

## 2022-05-06 DIAGNOSIS — K5909 Other constipation: Secondary | ICD-10-CM | POA: Diagnosis not present

## 2022-05-06 DIAGNOSIS — E782 Mixed hyperlipidemia: Secondary | ICD-10-CM | POA: Diagnosis not present

## 2022-05-06 DIAGNOSIS — E1165 Type 2 diabetes mellitus with hyperglycemia: Secondary | ICD-10-CM

## 2022-05-06 DIAGNOSIS — I10 Essential (primary) hypertension: Secondary | ICD-10-CM | POA: Diagnosis not present

## 2022-05-06 DIAGNOSIS — Z5900 Homelessness unspecified: Secondary | ICD-10-CM

## 2022-05-06 DIAGNOSIS — E1142 Type 2 diabetes mellitus with diabetic polyneuropathy: Secondary | ICD-10-CM | POA: Diagnosis not present

## 2022-05-06 LAB — LIPID PANEL
Cholesterol: 172 mg/dL (ref 0–200)
HDL: 45.1 mg/dL
LDL Cholesterol: 110 mg/dL — ABNORMAL HIGH (ref 0–99)
NonHDL: 127.04
Total CHOL/HDL Ratio: 4
Triglycerides: 84 mg/dL (ref 0.0–149.0)
VLDL: 16.8 mg/dL (ref 0.0–40.0)

## 2022-05-06 LAB — HEMOGLOBIN A1C: Hgb A1c MFr Bld: 12.2 % — ABNORMAL HIGH (ref 4.6–6.5)

## 2022-05-06 LAB — COMPREHENSIVE METABOLIC PANEL
ALT: 16 U/L (ref 0–35)
AST: 12 U/L (ref 0–37)
Albumin: 4.1 g/dL (ref 3.5–5.2)
Alkaline Phosphatase: 91 U/L (ref 39–117)
BUN: 14 mg/dL (ref 6–23)
CO2: 31 mEq/L (ref 19–32)
Calcium: 9.1 mg/dL (ref 8.4–10.5)
Chloride: 98 mEq/L (ref 96–112)
Creatinine, Ser: 0.85 mg/dL (ref 0.40–1.20)
GFR: 68.2 mL/min (ref >60.00)
Glucose, Bld: 270 mg/dL — ABNORMAL HIGH (ref 70–99)
Potassium: 3.7 mEq/L (ref 3.5–5.1)
Sodium: 137 mEq/L (ref 135–145)
Total Bilirubin: 0.6 mg/dL (ref 0.2–1.2)
Total Protein: 7.6 g/dL (ref 6.0–8.3)

## 2022-05-06 MED ORDER — TIRZEPATIDE 2.5 MG/0.5ML ~~LOC~~ SOAJ: 2.5000 mg | SUBCUTANEOUS | 0 refills | Status: DC

## 2022-05-06 MED ORDER — FLUCONAZOLE 150 MG PO TABS: 150.0000 mg | ORAL_TABLET | ORAL | 0 refills | Status: DC

## 2022-05-06 MED ORDER — AMLODIPINE BESYLATE 10 MG PO TABS: 10.0000 mg | ORAL_TABLET | Freq: Every day | ORAL | 1 refills | Status: DC

## 2022-05-06 MED ORDER — MONTELUKAST SODIUM 10 MG PO TABS: ORAL_TABLET | ORAL | 1 refills | Status: DC

## 2022-05-06 MED ORDER — GLIPIZIDE 10 MG PO TABS: ORAL_TABLET | ORAL | 3 refills | Status: DC

## 2022-05-06 MED ORDER — LINACLOTIDE 145 MCG PO CAPS: ORAL_CAPSULE | ORAL | 1 refills | Status: DC

## 2022-05-06 MED ORDER — METFORMIN HCL ER 500 MG PO TB24: ORAL_TABLET | ORAL | 1 refills | Status: DC

## 2022-05-06 NOTE — Assessment & Plan Note (Signed)
Chronic Stressed sugar control Not currently on medication

## 2022-05-06 NOTE — Assessment & Plan Note (Addendum)
Chronic BP not well controlled but she did not take her medication today Continue amlodipine 10 mg daily,  cmp

## 2022-05-06 NOTE — Assessment & Plan Note (Addendum)
Chronic Regular exercise and healthy diet encouraged Check lipid panel Statin intolerant-has tried a few Lab Results  Component Value Date   LDLCALC 105 (H) 07/11/2021

## 2022-05-06 NOTE — Assessment & Plan Note (Addendum)
Chronic   Lab Results  Component Value Date   HGBA1C 7.7 (H) 07/11/2021   Sugars not ideally controlled Check A1c Continue glipizide 20 mg twice daily, metformin XR 1000 mg twice daily.  Did not tolerate Ozempic-caused blurry vision-possibly from diabetic retinopathy? Sees eye doctor soon - stressed importance of seeing eye doctor Start mounjaro 2.5 mg weekly Stressed compliance with medication

## 2022-05-06 NOTE — Assessment & Plan Note (Signed)
Acute Probable candidiasis in addition to soreness from wiping too hard Start diflucan 150 mg Q 72 hrs x 3 Advised not to wipe hard to let sore areas heal -- if symptoms do not improved advised seeing gyn

## 2022-05-06 NOTE — Assessment & Plan Note (Signed)
Chronic Controlled, stable Continue linzess 145 mcg daily 

## 2022-05-07 ENCOUNTER — Telehealth: Payer: Self-pay | Admitting: *Deleted

## 2022-05-07 DIAGNOSIS — M5136 Other intervertebral disc degeneration, lumbar region: Secondary | ICD-10-CM | POA: Diagnosis not present

## 2022-05-07 NOTE — Progress Notes (Signed)
  Care Coordination   Note   05/07/2022 Name: Lindsay Hardy MRN: [id number] DOB: 1951-03-18  Lindsay Hardy is a 71 y.o. year old female who sees Burns, Bobette Mo, MD for primary care. I reached out to [name] by phone today to offer care coordination services.  Ms. Dwyer was given information about Care Coordination services today including:   The Care Coordination services include support from the care team which includes your Nurse Coordinator, Clinical Social Worker, or Pharmacist.  The Care Coordination team is here to help remove barriers to the health concerns and goals most important to you. Care Coordination services are voluntary, and the patient may decline or stop services at any time by request to their care team member.   Care Coordination Consent Status: Patient agreed to services and verbal consent obtained.   Follow up plan:  Telephone appointment with care coordination team member scheduled for:  05/11/2022  Encounter Outcome:  Pt. Scheduled from referral   Burman Nieves, [facility] Care Coordination Care Guide Direct Dial: [phone]

## 2022-05-09 ENCOUNTER — Other Ambulatory Visit: Payer: Self-pay | Admitting: Internal Medicine

## 2022-05-11 ENCOUNTER — Ambulatory Visit: Payer: Self-pay | Admitting: Licensed Clinical Social Worker

## 2022-05-11 NOTE — Patient Instructions (Signed)
Visit Information  Thank you for taking time to visit with me today. Please don't hesitate to contact me if I can be of assistance to you.   Following are the goals we discussed today:   Goals Addressed               This Visit's Progress     Care Coordination Activites- Housing (pt-stated)        Patient is approved for Section 8. Due to many renters no longer accepted voucher, patient is having difficulty securing a new resident. SW identified a few homes that are taking voucher and emailed homes to patient per patient request. SW will contact patient within 7 days to follow up.   Communities [place] [employer], [place] [address] [place] [address] [place] [facility] [place] [facility] [employer] [address] [place] [employer] [address] [place] [employer]         Patient verbalizes understanding of instructions and care plan provided today and agrees to view in [place]. Active MyChart status and patient understanding of how to access instructions and care plan via MyChart confirmed with patient.     No further follow up required: .  Christen Butter, Kenard Gower , MSW, LCSW-A Social Worker [facility] Care Management  [phone]

## 2022-05-11 NOTE — Patient Outreach (Signed)
  Care Coordination   Initial Visit Note   05/11/2022 Name: Lindsay Hardy MRN: [id number] DOB: [birth year]  Lindsay Hardy is a 71 y.o. year old female who sees Burns, Bobette Mo, MD for primary care. I spoke with  Lindsay Hardy by phone today.  What matters to the patients health and wellness today?  HOusing    Goals Addressed               This Visit's Progress     Care Coordination Activites- Housing (pt-stated)        Patient is approved for Section 8. Due to many renters no longer accepted voucher, patient is having difficulty securing a new resident. SW identified a few homes that are taking voucher and emailed homes to patient per patient request. SW will contact patient within 7 days to follow up.   Communities [place] [employer], [place] [address] [place] [address] [place] [facility] [place] [address] [place] [employer] [address] [employer] [place] [employer] [facility] [place] [address] [employer]        SDOH assessments and interventions completed:  Yes  SDOH Interventions Today    Flowsheet Row Most Recent Value  SDOH Interventions   Housing Interventions Other (Comment)  [Patient approved for Section 8. SW assisted patient with resources to locate an apartment who is accepted voucher.]        Care Coordination Interventions:  Yes, provided   Follow up plan: No further intervention required.   Encounter Outcome:  Pt. Visit Completed

## 2022-05-11 NOTE — Patient Outreach (Deleted)
{  CARE COORDINATION NOTES:[id number]} 

## 2022-05-17 DIAGNOSIS — M5136 Other intervertebral disc degeneration, lumbar region: Secondary | ICD-10-CM | POA: Diagnosis not present

## 2022-06-01 ENCOUNTER — Other Ambulatory Visit: Payer: Self-pay | Admitting: Internal Medicine

## 2022-06-09 DIAGNOSIS — G4730 Sleep apnea, unspecified: Secondary | ICD-10-CM | POA: Diagnosis not present

## 2022-06-09 DIAGNOSIS — G8929 Other chronic pain: Secondary | ICD-10-CM | POA: Diagnosis not present

## 2022-06-09 DIAGNOSIS — I10 Essential (primary) hypertension: Secondary | ICD-10-CM | POA: Diagnosis not present

## 2022-06-09 DIAGNOSIS — M5136 Other intervertebral disc degeneration, lumbar region: Secondary | ICD-10-CM | POA: Diagnosis not present

## 2022-06-16 DIAGNOSIS — M5136 Other intervertebral disc degeneration, lumbar region: Secondary | ICD-10-CM | POA: Diagnosis not present

## 2022-07-06 ENCOUNTER — Encounter: Payer: Self-pay | Admitting: Internal Medicine

## 2022-07-06 NOTE — Progress Notes (Deleted)
    Subjective:    Patient ID: Lindsay Hardy, female    DOB: [birth year], 72 y.o.   MRN: [id number]      HPI Lindsay Hardy is here for No chief complaint on file.    Cold symptoms -     Medications and allergies reviewed with patient and updated if appropriate.  Current Outpatient Medications on File Prior to Visit  Medication Sig Dispense Refill   ACCU-CHEK GUIDE test strip USE UP TO FOUR TIMES DAILY AS DIRECTED 100 strip 5   Accu-Chek Softclix Lancets lancets USE UP TO FOUR TIMES DAILY AS DIRECTED 100 each 1   albuterol (VENTOLIN HFA) 108 (90 Base) MCG/ACT inhaler INHALE 2 PUFFS INTO THE LUNGS EVERY 4 (FOUR) HOURS AS NEEDED FOR SHORTNESS OF BREATH 6.7 each 11   amLODipine (NORVASC) 10 MG tablet Take 1 tablet (10 mg total) by mouth daily. 90 tablet 1   aspirin 81 MG EC tablet TAKE 1 TABLET (81 MG TOTAL) BY MOUTH DAILY. 90 tablet 1   Blood Glucose Monitoring Suppl (ONETOUCH VERIO IQ SYSTEM) w/Device KIT Use to check blood sugar once to twice daily. 1 kit 0   fluconazole (DIFLUCAN) 150 MG tablet Take 1 tablet (150 mg total) by mouth every 3 (three) days. 3 tablet 0   fluticasone (FLONASE) 50 MCG/ACT nasal spray Place 2 sprays into both nostrils daily. 16 g 6   glipiZIDE (GLUCOTROL) 10 MG tablet TAKE 2 TABLETS BY MOUTH 2 TIMES DAILY BEFORE A MEAL. 360 tablet 3   HYDROcodone-acetaminophen (NORCO) 10-325 MG tablet SMARTSIG:1 Tablet(s) By Mouth 4-6 Times Daily     ibuprofen (ADVIL) 800 MG tablet TAKE 1 TABLET (800 MG TOTAL) BY MOUTH 2 (TWO) TIMES DAILY AS NEEDED FOR MODERATE PAIN. 180 tablet 0   linaclotide (LINZESS) 145 MCG CAPS capsule TAKE 1 CAPSULE BY MOUTH DAILY BEFORE BREAKFAST. 90 capsule 1   metFORMIN (GLUCOPHAGE-XR) 500 MG 24 hr tablet TAKE 2 TABLETS BY MOUTH 2 TIMES DAILY BEFORE A MEAL. 360 tablet 1   Misc. Devices (BARIATRIC ROLLATOR) MISC Use as directed.  Dx  M54.9 1 each 0   montelukast (SINGULAIR) 10 MG tablet TAKE 1 TABLET BY MOUTH EVERYDAY AT BEDTIME 90 tablet 1   RESTASIS  0.05 % ophthalmic emulsion      tirzepatide (MOUNJARO) 2.5 MG/0.5ML Pen INJECT 2.5 MG SUBCUTANEOUSLY WEEKLY 6 mL 1   No current facility-administered medications on file prior to visit.    Review of Systems     Objective:  There were no vitals filed for this visit. BP Readings from Last 3 Encounters:  05/06/22 (!) 150/76  07/11/21 (!) 142/82  04/02/21 (!) 144/90   Wt Readings from Last 3 Encounters:  05/06/22 266 lb (120.7 kg)  07/11/21 267 lb 9.6 oz (121.4 kg)  04/02/21 279 lb (126.6 kg)   There is no height or weight on file to calculate BMI.    Physical Exam         Assessment & Plan:    See Problem List for Assessment and Plan of chronic medical problems.

## 2022-07-07 ENCOUNTER — Ambulatory Visit: Payer: 59 | Admitting: Internal Medicine

## 2022-07-13 DIAGNOSIS — M5136 Other intervertebral disc degeneration, lumbar region: Secondary | ICD-10-CM | POA: Diagnosis not present

## 2022-07-13 DIAGNOSIS — I10 Essential (primary) hypertension: Secondary | ICD-10-CM | POA: Diagnosis not present

## 2022-07-13 DIAGNOSIS — Z79891 Long term (current) use of opiate analgesic: Secondary | ICD-10-CM | POA: Diagnosis not present

## 2022-07-13 DIAGNOSIS — Z5181 Encounter for therapeutic drug level monitoring: Secondary | ICD-10-CM | POA: Diagnosis not present

## 2022-07-13 DIAGNOSIS — G8929 Other chronic pain: Secondary | ICD-10-CM | POA: Diagnosis not present

## 2022-07-16 DIAGNOSIS — M5136 Other intervertebral disc degeneration, lumbar region: Secondary | ICD-10-CM | POA: Diagnosis not present

## 2022-07-28 ENCOUNTER — Other Ambulatory Visit: Payer: Self-pay | Admitting: Internal Medicine

## 2022-08-10 DIAGNOSIS — G894 Chronic pain syndrome: Secondary | ICD-10-CM | POA: Diagnosis not present

## 2022-08-10 DIAGNOSIS — M62830 Muscle spasm of back: Secondary | ICD-10-CM | POA: Diagnosis not present

## 2022-08-10 DIAGNOSIS — M5136 Other intervertebral disc degeneration, lumbar region: Secondary | ICD-10-CM | POA: Diagnosis not present

## 2022-08-15 DIAGNOSIS — M5136 Other intervertebral disc degeneration, lumbar region: Secondary | ICD-10-CM | POA: Diagnosis not present

## 2022-08-24 ENCOUNTER — Ambulatory Visit: Payer: 59 | Admitting: Podiatry

## 2022-08-31 ENCOUNTER — Ambulatory Visit: Payer: 59 | Admitting: Podiatry

## 2022-09-06 DIAGNOSIS — M5136 Other intervertebral disc degeneration, lumbar region: Secondary | ICD-10-CM | POA: Diagnosis not present

## 2022-09-07 DIAGNOSIS — M5136 Other intervertebral disc degeneration, lumbar region: Secondary | ICD-10-CM | POA: Diagnosis not present

## 2022-09-07 DIAGNOSIS — M62830 Muscle spasm of back: Secondary | ICD-10-CM | POA: Diagnosis not present

## 2022-09-07 DIAGNOSIS — G894 Chronic pain syndrome: Secondary | ICD-10-CM | POA: Diagnosis not present

## 2022-09-12 ENCOUNTER — Other Ambulatory Visit: Payer: Self-pay | Admitting: Internal Medicine

## 2022-09-17 ENCOUNTER — Other Ambulatory Visit: Payer: Self-pay

## 2022-09-17 ENCOUNTER — Other Ambulatory Visit: Payer: Self-pay | Admitting: Internal Medicine

## 2022-09-21 ENCOUNTER — Ambulatory Visit (INDEPENDENT_AMBULATORY_CARE_PROVIDER_SITE_OTHER): Payer: 59 | Admitting: Podiatry

## 2022-09-21 ENCOUNTER — Encounter: Payer: Self-pay | Admitting: Podiatry

## 2022-09-21 DIAGNOSIS — B351 Tinea unguium: Secondary | ICD-10-CM

## 2022-09-21 DIAGNOSIS — M79674 Pain in right toe(s): Secondary | ICD-10-CM | POA: Diagnosis not present

## 2022-09-21 DIAGNOSIS — M79675 Pain in left toe(s): Secondary | ICD-10-CM

## 2022-09-21 DIAGNOSIS — E119 Type 2 diabetes mellitus without complications: Secondary | ICD-10-CM

## 2022-09-21 NOTE — Progress Notes (Signed)
Chief Complaint  Patient presents with   Diabetes    Foot exam - diabetic - last A1c was 12.2, would like to have her toenails trimmed   New Patient (Initial Visit)    SUBJECTIVE Patient PMHx T2DM; uncontrolled presents to office today complaining of elongated, thickened nails that cause pain while ambulating in shoes.  Patient is unable to trim their own nails. Patient is here for further evaluation and treatment.  Past Medical History:  Diagnosis Date   Asthma    CAD (coronary artery disease) 11/2003    RCA 60% stenosis, LAD diffuse   Chicken pox    CVA (cerebrovascular accident) 2001    right frontoparietal cortical CVA - MRI September 2001   Diabetes mellitus    Diastolic dysfunction     ejection fraction 55-65% on 2-D echo May 2006   Empty sella syndrome     based on MRI September 2001, related to obesity, hypertension, it was determined that patient has not required treatment but will be monitored with TSH, ACTH, cortisol, testosterone, prolactin, growth hormone   GERD (gastroesophageal reflux disease)     EGD June 2004 -  followed by Dr. Leone Payor   Granulomatous disease, chronic     in right upper lobe per x-ray December 2005   Hyperlipidemia    Hypertension    NAFLD (nonalcoholic fatty liver disease)     based on ultrasound June 2002, elevated transaminase   OA (osteoarthritis)     DJP coracoclavicular ligament, left patella osteophytes June 2002, L4-S1 spondylosis, degenerative disc disease with bulge. I in October 2001, the C2-C4 spondylosis without stenosis and with spurs based on x-ray July 2005; diffuse idiopathic skeletal hyperostosis with bilateral hip lumbar degenerative changes   on x-ray February 2005, bilateral plantar calcaneal spurs   Reactive airway disease     peak flow 180 in May 2005, chronic sinusitis with PND, bronchitis December 2002 and strep pneumonia April 2007,  FEC 66, MCV 60, ratio is 69, DLCO 57,  moderate restrictive and mild obstructive  disease  on spirometry May 2005    Allergies  Allergen Reactions   Atorvastatin     REACTION: unable to tolerate. reason unknown   Clopidogrel Bisulfate     REACTION: weak, tired, sleepy, tightness (6/05); soreness in hip (11/05)   Crestor [Rosuvastatin Calcium]     Did not like how she felt on it - stomach upset, nausea   Fexofenadine     REACTION: intolerance unknown   Furosemide     REACTION: did not tolerate and preferred HCTZ   Gabapentin     sob   Hydrochlorothiazide W-Triamterene     REACTION: things crawled in her head and felt funny(10/04) ; h/a (3/07)   Lisinopril     REACTION: cough, right sided pain   Metoprolol Tartrate     REACTION: lightheadedness, h/a cough (7/04); heart gong to stop (7/06); sluggishnes and depressed feeling (2/07); weakness/heaviness 6/07)   Mometasone Furoate     REACTION: neck tenderness   Telmisartan     Shortness of breath, foot swelling   Ozempic (0.25 Or 0.5 Mg-Dose) [Semaglutide(0.25 Or 0.5mg -Dos)]     Blurry vision     OBJECTIVE General Patient is awake, alert, and oriented x 3 and in no acute distress. Derm Skin is dry and supple bilateral. Negative open lesions or macerations. Remaining integument unremarkable. Nails are tender, long, thickened and dystrophic with subungual debris, consistent with onychomycosis, 1-5 bilateral. No signs of infection noted. Vasc  DP  and PT pedal pulses palpable bilaterally. Temperature gradient within normal limits.  Neuro Epicritic and protective threshold sensation diminished bilaterally.  Musculoskeletal Exam No symptomatic pedal deformities noted bilateral. Muscular strength within normal limits.  ASSESSMENT 1. Diabetes Mellitus w/ peripheral neuropathy; uncontrolled 2.  Pain due to onychomycosis of toenails bilateral  PLAN OF CARE 1. Patient evaluated today.  Comprehensive diabetic foot exam performed today 2. Instructed to maintain good pedal hygiene and foot care. Stressed importance of  controlling blood sugar.  3. Mechanical debridement of nails 1-5 bilaterally performed using a nail nipper. Filed with dremel without incident.  4. Return to clinic in 3 mos. for routine footcare    Felecia Shelling, DPM [facility]  Dr. Felecia Shelling, DPM    [address] [place], [place] [address]                Office [phone]  Fax [phone]

## 2022-10-05 DIAGNOSIS — Z79891 Long term (current) use of opiate analgesic: Secondary | ICD-10-CM | POA: Diagnosis not present

## 2022-10-05 DIAGNOSIS — M5136 Other intervertebral disc degeneration, lumbar region: Secondary | ICD-10-CM | POA: Diagnosis not present

## 2022-10-05 DIAGNOSIS — G894 Chronic pain syndrome: Secondary | ICD-10-CM | POA: Diagnosis not present

## 2022-10-05 DIAGNOSIS — Z5181 Encounter for therapeutic drug level monitoring: Secondary | ICD-10-CM | POA: Diagnosis not present

## 2022-10-05 DIAGNOSIS — M62830 Muscle spasm of back: Secondary | ICD-10-CM | POA: Diagnosis not present

## 2022-10-14 ENCOUNTER — Other Ambulatory Visit: Payer: Self-pay | Admitting: Internal Medicine

## 2022-11-03 ENCOUNTER — Encounter: Payer: Self-pay | Admitting: Internal Medicine

## 2022-11-03 DIAGNOSIS — E538 Deficiency of other specified B group vitamins: Secondary | ICD-10-CM | POA: Insufficient documentation

## 2022-11-03 DIAGNOSIS — G894 Chronic pain syndrome: Secondary | ICD-10-CM | POA: Diagnosis not present

## 2022-11-03 DIAGNOSIS — M62830 Muscle spasm of back: Secondary | ICD-10-CM | POA: Diagnosis not present

## 2022-11-03 DIAGNOSIS — M5136 Other intervertebral disc degeneration, lumbar region: Secondary | ICD-10-CM | POA: Diagnosis not present

## 2022-11-03 NOTE — Progress Notes (Unsigned)
Subjective:    Patient ID: Lindsay Hardy, female    DOB: 1951/01/26, 72 y.o.   MRN: [id number]     HPI Lindsay Hardy is here for follow up of her chronic medical problems.  Back pain is worse - saw pain management yesterday. She thinks she needs a scooter - limited in walking.  Taking mounjaro weekly.   She does not want to continue it due to abdominal pain and blurry vision.  She is eating much better.  Has eye appt in a few weeks  Medications and allergies reviewed with patient and updated if appropriate.  Current Outpatient Medications on File Prior to Visit  Medication Sig Dispense Refill   ACCU-CHEK GUIDE test strip USE UP TO FOUR TIMES DAILY AS DIRECTED 100 strip 5   Accu-Chek Softclix Lancets lancets USE UP TO FOUR TIMES DAILY AS DIRECTED 100 each 1   albuterol (VENTOLIN HFA) 108 (90 Base) MCG/ACT inhaler INHALE 2 PUFFS INTO THE LUNGS EVERY 4 (FOUR) HOURS AS NEEDED FOR SHORTNESS OF BREATH 6.7 each 11   amLODipine (NORVASC) 10 MG tablet Take 1 tablet (10 mg total) by mouth daily. 90 tablet 1   aspirin 81 MG EC tablet TAKE 1 TABLET (81 MG TOTAL) BY MOUTH DAILY. 90 tablet 1   Blood Glucose Monitoring Suppl (ONETOUCH VERIO IQ SYSTEM) w/Device KIT Use to check blood sugar once to twice daily. 1 kit 0   fluticasone (FLONASE) 50 MCG/ACT nasal spray Place 2 sprays into both nostrils daily. 16 g 6   glipiZIDE (GLUCOTROL) 10 MG tablet TAKE 2 TABLETS BY MOUTH 2 TIMES DAILY BEFORE A MEAL. 360 tablet 3   HYDROcodone-acetaminophen (NORCO) 10-325 MG tablet SMARTSIG:1 Tablet(s) By Mouth 4-6 Times Daily     ibuprofen (ADVIL) 800 MG tablet TAKE 1 TABLET BY MOUTH 2 TIMES DAILY AS NEEDED FOR MODERATE PAIN. 20 tablet 0   linaclotide (LINZESS) 145 MCG CAPS capsule TAKE 1 CAPSULE BY MOUTH DAILY BEFORE BREAKFAST. 90 capsule 1   metFORMIN (GLUCOPHAGE-XR) 500 MG 24 hr tablet TAKE 2 TABLETS BY MOUTH 2 TIMES DAILY BEFORE A MEAL. 360 tablet 1   Misc. Devices (BARIATRIC ROLLATOR) MISC Use as directed.  Dx   M54.9 1 each 0   montelukast (SINGULAIR) 10 MG tablet TAKE 1 TABLET BY MOUTH EVERYDAY AT BEDTIME 90 tablet 1   naloxone (NARCAN) nasal spray 4 mg/0.1 mL SMARTSIG:Both Nares     potassium chloride (MICRO-K) 10 MEQ CR capsule TAKE 1 CAPSULE BY MOUTH TWICE A DAY 180 capsule 1   RESTASIS 0.05 % ophthalmic emulsion      tirzepatide (MOUNJARO) 2.5 MG/0.5ML Pen INJECT 2.5 MG SUBCUTANEOUSLY WEEKLY 6 mL 1   cyclobenzaprine (FLEXERIL) 5 MG tablet Take 5 mg by mouth at bedtime. (Patient not taking: Reported on 11/04/2022)     No current facility-administered medications on file prior to visit.     Review of Systems  Constitutional:  Negative for fever.  Respiratory:  Positive for wheezing (occ - asthma). Negative for cough and shortness of breath.   Cardiovascular:  Negative for chest pain, palpitations and leg swelling.  Musculoskeletal:  Positive for back pain (chronic).  Neurological:  Negative for light-headedness and headaches.       Objective:   Vitals:   11/04/22 1358  BP: 118/76  Pulse: (!) 102  Temp: 98.5 F (36.9 C)  SpO2: 97%   BP Readings from Last 3 Encounters:  11/04/22 118/76  05/06/22 (!) 150/76  07/11/21 (!) 142/82   [employer]  Readings from Last 3 Encounters:  11/04/22 266 lb (120.7 kg)  05/06/22 266 lb (120.7 kg)  07/11/21 267 lb 9.6 oz (121.4 kg)   Body mass index is 44.26 kg/m.    Physical Exam Constitutional:      General: She is not in acute distress.    Appearance: Normal appearance.  HENT:     Head: Normocephalic and atraumatic.  Eyes:     Conjunctiva/sclera: Conjunctivae normal.  Cardiovascular:     Rate and Rhythm: Normal rate and regular rhythm.     Heart sounds: Normal heart sounds.  Pulmonary:     Effort: Pulmonary effort is normal. No respiratory distress.     Breath sounds: Normal breath sounds. No wheezing.  Musculoskeletal:     Cervical back: Neck supple.     Right lower leg: No edema.     Left lower leg: No edema.  Lymphadenopathy:      Cervical: No cervical adenopathy.  Skin:    General: Skin is warm and dry.     Findings: No rash.  Neurological:     Mental Status: She is alert. Mental status is at baseline.  Psychiatric:        Mood and Affect: Mood normal.        Behavior: Behavior normal.        Lab Results  Component Value Date   WBC 6.9 04/02/2021   HGB 13.4 04/02/2021   HCT 42.1 04/02/2021   PLT 307.0 04/02/2021   GLUCOSE 270 (H) 05/06/2022   CHOL 172 05/06/2022   TRIG 84.0 05/06/2022   HDL 45.10 05/06/2022   LDLCALC 110 (H) 05/06/2022   ALT 16 05/06/2022   AST 12 05/06/2022   NA 137 05/06/2022   K 3.7 05/06/2022   CL 98 05/06/2022   CREATININE 0.85 05/06/2022   BUN 14 05/06/2022   CO2 31 05/06/2022   TSH 1.84 08/06/2020   HGBA1C 12.2 (H) 05/06/2022   MICROALBUR 8.8 (H) 07/11/2021     Assessment & Plan:    See Problem List for Assessment and Plan of chronic medical problems.

## 2022-11-03 NOTE — Patient Instructions (Addendum)
      Blood work was ordered.   The lab is on the first floor.    Medications changes include :       A referral was ordered and someone will call you to schedule an appointment.     Return in about 3 months (around 02/04/2023) for follow up.

## 2022-11-04 ENCOUNTER — Ambulatory Visit (INDEPENDENT_AMBULATORY_CARE_PROVIDER_SITE_OTHER): Payer: 59 | Admitting: Internal Medicine

## 2022-11-04 VITALS — BP 118/76 | HR 102 | Temp 98.5°F | Ht 65.0 in | Wt 266.0 lb

## 2022-11-04 DIAGNOSIS — M545 Low back pain, unspecified: Secondary | ICD-10-CM | POA: Diagnosis not present

## 2022-11-04 DIAGNOSIS — J4520 Mild intermittent asthma, uncomplicated: Secondary | ICD-10-CM

## 2022-11-04 DIAGNOSIS — G8929 Other chronic pain: Secondary | ICD-10-CM

## 2022-11-04 DIAGNOSIS — G720 Drug-induced myopathy: Secondary | ICD-10-CM | POA: Diagnosis not present

## 2022-11-04 DIAGNOSIS — J45909 Unspecified asthma, uncomplicated: Secondary | ICD-10-CM | POA: Insufficient documentation

## 2022-11-04 DIAGNOSIS — E1142 Type 2 diabetes mellitus with diabetic polyneuropathy: Secondary | ICD-10-CM

## 2022-11-04 DIAGNOSIS — E1165 Type 2 diabetes mellitus with hyperglycemia: Secondary | ICD-10-CM

## 2022-11-04 DIAGNOSIS — T466X5A Adverse effect of antihyperlipidemic and antiarteriosclerotic drugs, initial encounter: Secondary | ICD-10-CM

## 2022-11-04 DIAGNOSIS — I2510 Atherosclerotic heart disease of native coronary artery without angina pectoris: Secondary | ICD-10-CM | POA: Diagnosis not present

## 2022-11-04 DIAGNOSIS — E538 Deficiency of other specified B group vitamins: Secondary | ICD-10-CM | POA: Diagnosis not present

## 2022-11-04 DIAGNOSIS — I10 Essential (primary) hypertension: Secondary | ICD-10-CM | POA: Diagnosis not present

## 2022-11-04 DIAGNOSIS — E782 Mixed hyperlipidemia: Secondary | ICD-10-CM

## 2022-11-04 DIAGNOSIS — Z7984 Long term (current) use of oral hypoglycemic drugs: Secondary | ICD-10-CM

## 2022-11-04 DIAGNOSIS — Z7985 Long-term (current) use of injectable non-insulin antidiabetic drugs: Secondary | ICD-10-CM | POA: Diagnosis not present

## 2022-11-04 LAB — COMPREHENSIVE METABOLIC PANEL
ALT: 17 U/L (ref 0–35)
AST: 16 U/L (ref 0–37)
Albumin: 3.7 g/dL (ref 3.5–5.2)
Alkaline Phosphatase: 73 U/L (ref 39–117)
BUN: 8 mg/dL (ref 6–23)
CO2: 29 mEq/L (ref 19–32)
Calcium: 9.1 mg/dL (ref 8.4–10.5)
Chloride: 100 mEq/L (ref 96–112)
Creatinine, Ser: 0.94 mg/dL (ref 0.40–1.20)
GFR: 60.23 mL/min (ref >60.00)
Glucose, Bld: 163 mg/dL — ABNORMAL HIGH (ref 70–99)
Potassium: 3.4 mEq/L — ABNORMAL LOW (ref 3.5–5.1)
Sodium: 137 mEq/L (ref 135–145)
Total Bilirubin: 0.4 mg/dL (ref 0.2–1.2)
Total Protein: 7.3 g/dL (ref 6.0–8.3)

## 2022-11-04 LAB — MICROALBUMIN / CREATININE URINE RATIO
Creatinine,U: 122.9 mg/dL
Microalb Creat Ratio: 3.7 mg/g (ref 0.0–30.0)
Microalb, Ur: 4.6 mg/dL — ABNORMAL HIGH (ref 0.0–1.9)

## 2022-11-04 LAB — CBC WITH DIFFERENTIAL/PLATELET
Basophils Absolute: 0 10*3/uL (ref 0.0–0.1)
Basophils Relative: 0.8 % (ref 0.0–3.0)
Eosinophils Absolute: 0.1 10*3/uL (ref 0.0–0.7)
Eosinophils Relative: 2.6 % (ref 0.0–5.0)
HCT: 39.3 % (ref 36.0–46.0)
Hemoglobin: 12.5 g/dL (ref 12.0–15.0)
Lymphocytes Relative: 32.8 % (ref 12.0–46.0)
Lymphs Abs: 1.9 10*3/uL (ref 0.7–4.0)
MCHC: 31.8 g/dL (ref 30.0–36.0)
MCV: 80 fl (ref 78.0–100.0)
Monocytes Absolute: 0.5 10*3/uL (ref 0.1–1.0)
Monocytes Relative: 8.8 % (ref 3.0–12.0)
Neutro Abs: 3.2 10*3/uL (ref 1.4–7.7)
Neutrophils Relative %: 55 % (ref 43.0–77.0)
Platelets: 280 10*3/uL (ref 150.0–400.0)
RBC: 4.92 Mil/uL (ref 3.87–5.11)
RDW: 14.7 % (ref 11.5–15.5)
WBC: 5.8 10*3/uL (ref 4.0–10.5)

## 2022-11-04 LAB — LIPID PANEL
Cholesterol: 149 mg/dL (ref 0–200)
HDL: 39.6 mg/dL (ref >39.00)
LDL Cholesterol: 93 mg/dL (ref 0–99)
NonHDL: 109.22
Total CHOL/HDL Ratio: 4
Triglycerides: 79 mg/dL (ref 0.0–149.0)
VLDL: 15.8 mg/dL (ref 0.0–40.0)

## 2022-11-04 LAB — VITAMIN B12: Vitamin B-12: 170 pg/mL — ABNORMAL LOW (ref 211–911)

## 2022-11-04 LAB — HEMOGLOBIN A1C: Hgb A1c MFr Bld: 7.9 % — ABNORMAL HIGH (ref 4.6–6.5)

## 2022-11-04 NOTE — Assessment & Plan Note (Signed)
Chronic Regular exercise and healthy diet encouraged Check lipid panel Statin intolerant-has tried a few Lab Results  Component Value Date   LDLCALC 93 11/04/2022   Will discuss zetia at next visit

## 2022-11-04 NOTE — Assessment & Plan Note (Signed)
Chronic Following with pain management Pain medication has been decreased - increased pain medication

## 2022-11-04 NOTE — Assessment & Plan Note (Signed)
Chronic Stressed sugar control Not currently on medication 

## 2022-11-04 NOTE — Assessment & Plan Note (Signed)
Chronic No chest pain, SOB Statin intolerant  Will discuss zetia at next visit

## 2022-11-04 NOTE — Assessment & Plan Note (Signed)
Chronic BP not well controlled but she did not take her medication today Continue amlodipine 10 mg daily,  cmp  

## 2022-11-04 NOTE — Assessment & Plan Note (Signed)
Chronic   Lab Results  Component Value Date   HGBA1C 7.9 (H) 11/04/2022   Sugars improved not ideally controlled A1c today Continue glipizide 20 mg twice daily, metformin XR 1000 mg twice daily.  Mounajro 2.5 mg weekly Stressed compliance with medication Continue diabetic diet

## 2022-11-04 NOTE — Assessment & Plan Note (Signed)
H/o statin myopathy Intolerance of statins - has tried more than one

## 2022-11-04 NOTE — Assessment & Plan Note (Signed)
Chronic Mild, intermittent Uses albuterol prn

## 2022-11-17 ENCOUNTER — Encounter: Payer: Self-pay | Admitting: Internal Medicine

## 2022-11-17 NOTE — Progress Notes (Addendum)
Subjective:    Patient ID: Lindsay Hardy, female    DOB: Apr 29, 1951, 72 y.o.   MRN: [id number]     HPI Lindsay Hardy is here for  --   Reason for Visit: Mobility Evaluation   She lives in an apartment.  She has chronic lower back pain and can not stand long periods or walk long distances.  She can use a walker for short distances only but needs an electric wheelchair to enable her to go long distances.    An electric wheelchair will assist the patient in the home with ADLs; including cooking, cleaning and toileting.  This will allow her to ambulate long distances in and out of her home and enable her to go to the grocery store which is near her apartment.  She can use a walker for short distances only but can not stand long periods so a walker will not help with some of her ADL's.  A cane, walker or manual wheel chair will not assist patient efficiently due to living in an apartment, obesity, dyspnea on exertion, upper extremity weakness - shoulder and upper arms, neuropathy resulting in poor balance, chronic back pain and lower extremity pain and weakness. She has the ability to operate the electric wheelchair.  Patient has the mental and physical capacity to follow and operate the electric wheelchair safety instructions. Patient is willing to use in the home as well as outside of the home.       Medications and allergies reviewed with patient and updated if appropriate.  Current Outpatient Medications on File Prior to Visit  Medication Sig Dispense Refill   ACCU-CHEK GUIDE test strip USE UP TO FOUR TIMES DAILY AS DIRECTED 100 strip 5   Accu-Chek Softclix Lancets lancets USE UP TO FOUR TIMES DAILY AS DIRECTED 100 each 1   albuterol (VENTOLIN HFA) 108 (90 Base) MCG/ACT inhaler INHALE 2 PUFFS INTO THE LUNGS EVERY 4 (FOUR) HOURS AS NEEDED FOR SHORTNESS OF BREATH 6.7 each 11   amLODipine (NORVASC) 10 MG tablet Take 1 tablet (10 mg total) by mouth daily. 90 tablet 1   aspirin 81 MG EC  tablet TAKE 1 TABLET (81 MG TOTAL) BY MOUTH DAILY. 90 tablet 1   Blood Glucose Monitoring Suppl (ONETOUCH VERIO IQ SYSTEM) w/Device KIT Use to check blood sugar once to twice daily. 1 kit 0   fluticasone (FLONASE) 50 MCG/ACT nasal spray Place 2 sprays into both nostrils daily. 16 g 6   glipiZIDE (GLUCOTROL) 10 MG tablet TAKE 2 TABLETS BY MOUTH 2 TIMES DAILY BEFORE A MEAL. 360 tablet 3   HYDROcodone-acetaminophen (NORCO) 10-325 MG tablet SMARTSIG:1 Tablet(s) By Mouth 4-6 Times Daily     ibuprofen (ADVIL) 800 MG tablet TAKE 1 TABLET BY MOUTH 2 TIMES DAILY AS NEEDED FOR MODERATE PAIN. 20 tablet 0   linaclotide (LINZESS) 145 MCG CAPS capsule TAKE 1 CAPSULE BY MOUTH DAILY BEFORE BREAKFAST. 90 capsule 1   metFORMIN (GLUCOPHAGE-XR) 500 MG 24 hr tablet TAKE 2 TABLETS BY MOUTH 2 TIMES DAILY BEFORE A MEAL. 360 tablet 1   Misc. Devices (BARIATRIC ROLLATOR) MISC Use as directed.  Dx  M54.9 1 each 0   montelukast (SINGULAIR) 10 MG tablet TAKE 1 TABLET BY MOUTH EVERYDAY AT BEDTIME 90 tablet 1   naloxone (NARCAN) nasal spray 4 mg/0.1 mL SMARTSIG:Both Nares     potassium chloride (MICRO-K) 10 MEQ CR capsule TAKE 1 CAPSULE BY MOUTH TWICE A DAY 180 capsule 1   RESTASIS 0.05 % ophthalmic  emulsion      tirzepatide (MOUNJARO) 2.5 MG/0.5ML Pen INJECT 2.5 MG SUBCUTANEOUSLY WEEKLY 6 mL 1   No current facility-administered medications on file prior to visit.     Review of Systems     Objective:   Vitals:   11/18/22 1549  BP: (!) 130/90  Pulse: 94  Temp: 98.4 F (36.9 C)  SpO2: 98%   BP Readings from Last 3 Encounters:  11/18/22 (!) 130/90  11/04/22 118/76  05/06/22 (!) 150/76   Wt Readings from Last 3 Encounters:  11/18/22 266 lb (120.7 kg)  11/04/22 266 lb (120.7 kg)  05/06/22 266 lb (120.7 kg)   Body mass index is 44.26 kg/m.    Physical Exam Constitutional:      General: She is not in acute distress.    Appearance: Normal appearance.  HENT:     Head: Normocephalic and atraumatic.  Eyes:      Conjunctiva/sclera: Conjunctivae normal.  Cardiovascular:     Rate and Rhythm: Normal rate and regular rhythm.     Heart sounds: Normal heart sounds.  Pulmonary:     Effort: Pulmonary effort is normal. No respiratory distress.     Breath sounds: Normal breath sounds. No wheezing.  Musculoskeletal:     Cervical back: Neck supple.     Right lower leg: No edema.     Left lower leg: No edema.     Comments: Mild dec ROM of b/l shoulders  Lymphadenopathy:     Cervical: No cervical adenopathy.  Skin:    General: Skin is warm and dry.     Findings: No rash.  Neurological:     Mental Status: She is alert. Mental status is at baseline.     Sensory: No sensory deficit.     Motor: Weakness (mild generalized weakness, 4/5) present.     Comments: Good grip b/l hands  Psychiatric:        Mood and Affect: Mood normal.        Behavior: Behavior normal.        Lab Results  Component Value Date   WBC 5.8 11/04/2022   HGB 12.5 11/04/2022   HCT 39.3 11/04/2022   PLT 280.0 11/04/2022   GLUCOSE 163 (H) 11/04/2022   CHOL 149 11/04/2022   TRIG 79.0 11/04/2022   HDL 39.60 11/04/2022   LDLCALC 93 11/04/2022   ALT 17 11/04/2022   AST 16 11/04/2022   NA 137 11/04/2022   K 3.4 (L) 11/04/2022   CL 100 11/04/2022   CREATININE 0.94 11/04/2022   BUN 8 11/04/2022   CO2 29 11/04/2022   TSH 1.84 08/06/2020   HGBA1C 7.9 (H) 11/04/2022   MICROALBUR 4.6 (H) 11/04/2022     Assessment & Plan:    Chronic lower back pain, upper extremity weakness, decreased stamina, dyspnea on exertion, lower extremity weakness, diabetic neuropathy: Chronic All of these medical problems limit her significantly-she is not able to stand long periods of time and is not able to walk long distances She is able to use a cane, rolling walker inside her apartment for short distances, but is still limited because she cannot stand long periods of time even with this which makes doing her ADLs not possible Needs rollator  for inside the apartment and electric wheelchair for inside and outside the apartment for longer distances and went she required to stand long time.

## 2022-11-17 NOTE — Patient Instructions (Addendum)
      Power scooter will be ordered. A walker will be ordered.    Return for follow up as scheduled.

## 2022-11-18 ENCOUNTER — Ambulatory Visit (INDEPENDENT_AMBULATORY_CARE_PROVIDER_SITE_OTHER): Payer: 59 | Admitting: Internal Medicine

## 2022-11-18 ENCOUNTER — Other Ambulatory Visit: Payer: Self-pay | Admitting: Internal Medicine

## 2022-11-18 VITALS — BP 130/90 | HR 94 | Temp 98.4°F | Ht 65.0 in | Wt 266.0 lb

## 2022-11-18 DIAGNOSIS — I10 Essential (primary) hypertension: Secondary | ICD-10-CM

## 2022-11-18 DIAGNOSIS — G8929 Other chronic pain: Secondary | ICD-10-CM | POA: Diagnosis not present

## 2022-11-18 DIAGNOSIS — Z7984 Long term (current) use of oral hypoglycemic drugs: Secondary | ICD-10-CM

## 2022-11-18 DIAGNOSIS — E1165 Type 2 diabetes mellitus with hyperglycemia: Secondary | ICD-10-CM | POA: Diagnosis not present

## 2022-11-18 DIAGNOSIS — E1142 Type 2 diabetes mellitus with diabetic polyneuropathy: Secondary | ICD-10-CM | POA: Diagnosis not present

## 2022-11-18 DIAGNOSIS — M5441 Lumbago with sciatica, right side: Secondary | ICD-10-CM

## 2022-11-18 DIAGNOSIS — M5442 Lumbago with sciatica, left side: Secondary | ICD-10-CM

## 2022-11-18 MED ORDER — BARIATRIC ROLLATOR MISC: 0 refills | Status: DC

## 2022-11-18 NOTE — Assessment & Plan Note (Signed)
Chronic BP borderline elevated Working on weight loss Continue amlodipine 10 mg daily,

## 2022-11-18 NOTE — Assessment & Plan Note (Signed)
Chronic   Lab Results  Component Value Date   HGBA1C 7.9 (H) 11/04/2022   Sugars improved not ideally controlled Continue glipizide 20 mg twice daily, metformin XR 1000 mg twice daily.  Mounajro 2.5 mg weekly Stressed compliance with medication Continue diabetic diet Continue weight loss efforts

## 2022-11-18 NOTE — Assessment & Plan Note (Signed)
Chronic Following with pain management Pain medication has been decreased - increased back pain as a result

## 2022-11-20 ENCOUNTER — Other Ambulatory Visit: Payer: Self-pay | Admitting: Internal Medicine

## 2022-11-20 DIAGNOSIS — R260 Ataxic gait: Secondary | ICD-10-CM | POA: Diagnosis not present

## 2022-11-30 DIAGNOSIS — M5136 Other intervertebral disc degeneration, lumbar region: Secondary | ICD-10-CM | POA: Diagnosis not present

## 2022-11-30 DIAGNOSIS — M62830 Muscle spasm of back: Secondary | ICD-10-CM | POA: Diagnosis not present

## 2022-11-30 DIAGNOSIS — G894 Chronic pain syndrome: Secondary | ICD-10-CM | POA: Diagnosis not present

## 2022-12-07 ENCOUNTER — Telehealth: Payer: Self-pay | Admitting: Internal Medicine

## 2022-12-07 DIAGNOSIS — G8929 Other chronic pain: Secondary | ICD-10-CM

## 2022-12-07 DIAGNOSIS — M170 Bilateral primary osteoarthritis of knee: Secondary | ICD-10-CM

## 2022-12-07 DIAGNOSIS — E1142 Type 2 diabetes mellitus with diabetic polyneuropathy: Secondary | ICD-10-CM

## 2022-12-07 NOTE — Telephone Encounter (Signed)
Patient would like a call back from a nurse. She said there was an issue with insurance and a mobility aid. She would like a call back at [phone].

## 2022-12-08 NOTE — Telephone Encounter (Signed)
Message left for patient to return call to clinic.  If she calls back please have her check with insurance company to see what is covered so we know what to try and order for her.

## 2022-12-08 NOTE — Telephone Encounter (Signed)
Patient said her mobility aid is not covered by insurance and needs something different. She would like a call back at [phone].

## 2022-12-09 NOTE — Telephone Encounter (Signed)
Patient said to just order a mobile wheelchair that that would be covered whereas the scooter is not.

## 2022-12-11 ENCOUNTER — Telehealth: Payer: Self-pay | Admitting: Internal Medicine

## 2022-12-11 NOTE — Telephone Encounter (Signed)
Message sent to [employer] in house regarding updated order for processing.

## 2022-12-11 NOTE — Telephone Encounter (Signed)
DME printed-note revised.

## 2022-12-11 NOTE — Addendum Note (Signed)
Addended by: Pincus Sanes on: 12/11/2022 12:11 PM   Modules accepted: Orders

## 2022-12-11 NOTE — Telephone Encounter (Signed)
Electric wheelchair DME ordered

## 2023-01-04 DIAGNOSIS — M62830 Muscle spasm of back: Secondary | ICD-10-CM | POA: Diagnosis not present

## 2023-01-04 DIAGNOSIS — G894 Chronic pain syndrome: Secondary | ICD-10-CM | POA: Diagnosis not present

## 2023-01-04 DIAGNOSIS — M5136 Other intervertebral disc degeneration, lumbar region: Secondary | ICD-10-CM | POA: Diagnosis not present

## 2023-01-04 DIAGNOSIS — Z79891 Long term (current) use of opiate analgesic: Secondary | ICD-10-CM | POA: Diagnosis not present

## 2023-01-05 ENCOUNTER — Telehealth: Payer: Self-pay | Admitting: Internal Medicine

## 2023-01-05 MED ORDER — MOUNJARO 2.5 MG/0.5ML ~~LOC~~ SOAJ: 2.5000 mg | SUBCUTANEOUS | 0 refills | Status: DC

## 2023-01-05 NOTE — Telephone Encounter (Signed)
Refill has been sent to [facility].Marland KitchenRaechel Chute

## 2023-01-05 NOTE — Telephone Encounter (Signed)
Prescription Request  01/05/2023  LOV: 11/18/2022  What is the name of the medication or equipment?  tirzepatide [facility]) 2.5 MG/0.5ML Pen  Have you contacted your pharmacy to request a refill? No   Which pharmacy would you like this sent to?  [id number] - [place], [place] - [address] AT [facility] [address] [place] [address] Phone: [phone] Fax: [phone]    Patient notified that their request is being sent to the clinical staff for review and that they should receive a response within 2 business days.   Please advise at Mobile [phone] (mobile)

## 2023-01-12 DIAGNOSIS — M5136 Other intervertebral disc degeneration, lumbar region: Secondary | ICD-10-CM | POA: Diagnosis not present

## 2023-01-21 ENCOUNTER — Encounter (INDEPENDENT_AMBULATORY_CARE_PROVIDER_SITE_OTHER): Payer: Self-pay

## 2023-01-21 DIAGNOSIS — H40023 Open angle with borderline findings, high risk, bilateral: Secondary | ICD-10-CM | POA: Diagnosis not present

## 2023-01-21 DIAGNOSIS — E119 Type 2 diabetes mellitus without complications: Secondary | ICD-10-CM | POA: Diagnosis not present

## 2023-01-21 DIAGNOSIS — H53462 Homonymous bilateral field defects, left side: Secondary | ICD-10-CM | POA: Diagnosis not present

## 2023-01-21 DIAGNOSIS — Z961 Presence of intraocular lens: Secondary | ICD-10-CM | POA: Diagnosis not present

## 2023-01-21 LAB — HM DIABETES EYE EXAM

## 2023-02-01 DIAGNOSIS — M5136 Other intervertebral disc degeneration, lumbar region: Secondary | ICD-10-CM | POA: Diagnosis not present

## 2023-02-01 DIAGNOSIS — M62830 Muscle spasm of back: Secondary | ICD-10-CM | POA: Diagnosis not present

## 2023-02-01 DIAGNOSIS — G894 Chronic pain syndrome: Secondary | ICD-10-CM | POA: Diagnosis not present

## 2023-02-03 ENCOUNTER — Encounter: Payer: Self-pay | Admitting: Podiatry

## 2023-02-03 ENCOUNTER — Ambulatory Visit (INDEPENDENT_AMBULATORY_CARE_PROVIDER_SITE_OTHER): Payer: 59 | Admitting: Podiatry

## 2023-02-03 DIAGNOSIS — M79675 Pain in left toe(s): Secondary | ICD-10-CM | POA: Diagnosis not present

## 2023-02-03 DIAGNOSIS — B351 Tinea unguium: Secondary | ICD-10-CM | POA: Diagnosis not present

## 2023-02-03 DIAGNOSIS — M79674 Pain in right toe(s): Secondary | ICD-10-CM

## 2023-02-03 DIAGNOSIS — E1142 Type 2 diabetes mellitus with diabetic polyneuropathy: Secondary | ICD-10-CM

## 2023-02-06 NOTE — Progress Notes (Signed)
Subjective:  Patient ID: Lindsay Hardy, female    DOB: June 15, 1950,  MRN: [id number]  Shabnam Shillingford presents to clinic today for at risk foot care with history of diabetic neuropathy and painful elongated mycotic toenails 1-5 bilaterally which are tender when wearing enclosed shoe gear. Pain is relieved with periodic professional debridement. Patient states her left great toe was cut on last visit and is still painful today. Pain is present under toenail as well as distal tip. Chief Complaint  Patient presents with   Nail Problem    Dfc,Referring Provider Pincus Sanes, MD,lov:06/24,A1C:7.9,Bs:unknown       New problem(s): None. painful elongated mycotic toenails 1-5 bilaterally which are tender when wearing enclosed shoe gear. Pain is relieved with periodic professional debridement.  PCP is Pincus Sanes, MD.  Allergies  Allergen Reactions   Atorvastatin     REACTION: unable to tolerate. reason unknown   Clopidogrel Bisulfate     REACTION: weak, tired, sleepy, tightness (6/05); soreness in hip (11/05)   Crestor [Rosuvastatin Calcium]     Did not like how she felt on it - stomach upset, nausea   Fexofenadine     REACTION: intolerance unknown   Furosemide     REACTION: did not tolerate and preferred HCTZ   Gabapentin     sob   Hydrochlorothiazide     Other Reaction(s): Unknown   Hydrochlorothiazide W-Triamterene     REACTION: things crawled in her head and felt funny(10/04) ; h/a (3/07)   Lisinopril     REACTION: cough, right sided pain   Metoprolol Tartrate     REACTION: lightheadedness, h/a cough (7/04); heart gong to stop (7/06); sluggishnes and depressed feeling (2/07); weakness/heaviness 6/07)   Mometasone Furoate     REACTION: neck tenderness   Telmisartan     Shortness of breath, foot swelling   Ozempic (0.25 Or 0.5 Mg-Dose) [Semaglutide(0.25 Or 0.5mg -Dos)]     Blurry vision    Review of Systems: Negative except as noted in the HPI.  Objective: No changes  noted in today's physical examination. There were no vitals filed for this visit. Lindsay Hardy is a pleasant 72 y.o. female in NAD. AAO x 3.  Vascular Examination: Capillary refill time immediate b/l. Vascular status intact b/l with palpable pedal pulses. Pedal hair present b/l. No pain with calf compression b/l. Skin temperature gradient WNL b/l. No cyanosis or clubbing b/l. No ischemia or gangrene noted b/l.   Neurological Examination: Protective sensation diminished with 10g monofilament b/l.   Dermatological Examination: Pedal skin with normal turgor, texture and tone b/l.  No open wounds. No interdigital macerations.   Toenails 1-5 b/l thick, discolored, elongated with subungual debris and pain on dorsal palpation.   No corns, calluses nor porokeratotic lesions noted.  Musculoskeletal Examination: Muscle strength 5/5 to all lower extremity muscle groups bilaterally. No pain, crepitus or joint limitation noted with ROM bilateral LE. No gross bony deformities bilaterally.  Radiographs: None  Last A1c:      Latest Ref Rng & Units 11/04/2022    3:06 PM 05/06/2022    2:55 PM  Hemoglobin A1C  Hemoglobin-A1c 4.6 - 6.5 % 7.9  12.2    Assessment/Plan: 1. Pain due to onychomycosis of toenails of both feet   2. Diabetic peripheral neuropathy (HCC)     -Consent given for treatment as described below: -Examined patient. -Continue foot and shoe inspections daily. Monitor blood glucose per PCP/Endocrinologist's recommendations. -Patient to continue soft, supportive shoe gear daily. -Toenails 1-5 b/l were  debrided in length and girth with sterile nail nippers and dremel without iatrogenic bleeding.  -Patient/POA to call should there be question/concern in the interim.   Return in about 3 months (around 05/06/2023).  Freddie Breech, DPM

## 2023-02-11 ENCOUNTER — Telehealth: Payer: Self-pay | Admitting: Internal Medicine

## 2023-02-11 DIAGNOSIS — M5136 Other intervertebral disc degeneration, lumbar region: Secondary | ICD-10-CM | POA: Diagnosis not present

## 2023-02-11 DIAGNOSIS — E1142 Type 2 diabetes mellitus with diabetic polyneuropathy: Secondary | ICD-10-CM

## 2023-02-11 DIAGNOSIS — G8929 Other chronic pain: Secondary | ICD-10-CM

## 2023-02-11 NOTE — Telephone Encounter (Signed)
Patient called and said she was supposed to have a mobility aid ordered by Dr. Lawerance Bach and that she hasn't heard anything back. She would like a call back at [phone] to discuss.

## 2023-02-11 NOTE — Telephone Encounter (Signed)
printed

## 2023-02-24 ENCOUNTER — Encounter: Payer: Self-pay | Admitting: Pharmacist

## 2023-02-24 ENCOUNTER — Other Ambulatory Visit: Payer: Self-pay | Admitting: Internal Medicine

## 2023-03-04 ENCOUNTER — Other Ambulatory Visit: Payer: Self-pay | Admitting: Internal Medicine

## 2023-03-05 MED ORDER — MOUNJARO 2.5 MG/0.5ML ~~LOC~~ SOAJ: 2.5000 mg | SUBCUTANEOUS | 0 refills | Status: DC

## 2023-03-07 MED ORDER — NALOXONE HCL 4 MG/0.1ML NA LIQD: NASAL | 1 refills | Status: DC

## 2023-03-08 ENCOUNTER — Ambulatory Visit: Payer: 59 | Admitting: Internal Medicine

## 2023-03-08 DIAGNOSIS — G894 Chronic pain syndrome: Secondary | ICD-10-CM | POA: Diagnosis not present

## 2023-03-08 DIAGNOSIS — M62830 Muscle spasm of back: Secondary | ICD-10-CM | POA: Diagnosis not present

## 2023-03-08 DIAGNOSIS — M5136 Other intervertebral disc degeneration, lumbar region: Secondary | ICD-10-CM | POA: Diagnosis not present

## 2023-03-11 ENCOUNTER — Encounter: Payer: Self-pay | Admitting: Internal Medicine

## 2023-03-11 NOTE — Progress Notes (Signed)
Subjective:    Patient ID: Lindsay Hardy, female    DOB: 08-23-1950, 72 y.o.   MRN: [id number]     HPI Lindsay Hardy is here for follow up of her chronic medical problems.   Has had sinus pressure x few days.  Has ear pain, sore throat, headaches, lightheadedness/dizziness, minimal cough, wheeze, mild sob, and fatigue.  Taken ibuprofen, otc meds.    Kidney function has slowly decreased over last 2 years B12 level 170   10/2022 A1c 7.9%  10/2022  Currently taking for DM Glipizide 20 mg bid Metformin xr 1000 mg bid Mounajro 2.5 mg weekly  Medications and allergies reviewed with patient and updated if appropriate.  Current Outpatient Medications on File Prior to Visit  Medication Sig Dispense Refill   ACCU-CHEK GUIDE test strip USE UP TO FOUR TIMES DAILY AS DIRECTED 100 strip 5   Accu-Chek Softclix Lancets lancets USE UP TO FOUR TIMES DAILY AS DIRECTED 100 each 1   albuterol (VENTOLIN HFA) 108 (90 Base) MCG/ACT inhaler INHALE 2 PUFFS INTO THE LUNGS EVERY 4 (FOUR) HOURS AS NEEDED FOR SHORTNESS OF BREATH 6.7 each 11   amLODipine (NORVASC) 10 MG tablet TAKE 1 TABLET BY MOUTH EVERY DAY 90 tablet 1   aspirin 81 MG EC tablet TAKE 1 TABLET (81 MG TOTAL) BY MOUTH DAILY. 90 tablet 1   Blood Glucose Monitoring Suppl (ONETOUCH VERIO IQ SYSTEM) w/Device KIT Use to check blood sugar once to twice daily. 1 kit 0   fluticasone (FLONASE) 50 MCG/ACT nasal spray Place 2 sprays into both nostrils daily. 16 g 6   glipiZIDE (GLUCOTROL) 10 MG tablet TAKE 2 TABLETS BY MOUTH 2 TIMES DAILY BEFORE A MEAL. 360 tablet 3   HYDROcodone-acetaminophen (NORCO) 10-325 MG tablet SMARTSIG:1 Tablet(s) By Mouth 4-6 Times Daily     ibuprofen (ADVIL) 800 MG tablet TAKE 1 TABLET BY MOUTH 2 TIMES DAILY AS NEEDED FOR MODERATE PAIN. 20 tablet 0   linaclotide (LINZESS) 145 MCG CAPS capsule TAKE 1 CAPSULE BY MOUTH DAILY BEFORE BREAKFAST. 90 capsule 1   metFORMIN (GLUCOPHAGE-XR) 500 MG 24 hr tablet TAKE 2 TABLETS BY MOUTH 2  TIMES DAILY BEFORE A MEAL. 360 tablet 1   Misc. Devices (BARIATRIC ROLLATOR) MISC Use as directed.  Dx  M54.9 1 each 0   montelukast (SINGULAIR) 10 MG tablet TAKE 1 TABLET BY MOUTH EVERYDAY AT BEDTIME 90 tablet 1   naloxone (NARCAN) nasal spray 4 mg/0.1 mL 1 spray in one nostril x1; may repeat dose in alternate nostrils q2-3min prn 1 each 1   potassium chloride (MICRO-K) 10 MEQ CR capsule TAKE 1 CAPSULE BY MOUTH TWICE A DAY 180 capsule 1   RESTASIS 0.05 % ophthalmic emulsion      tirzepatide (MOUNJARO) 2.5 MG/0.5ML Pen Inject 2.5 mg into the skin once a week. 6 mL 0   No current facility-administered medications on file prior to visit.     Review of Systems  Constitutional:  Positive for fatigue. Negative for fever.  HENT:  Positive for ear pain, sinus pain and sore throat (mild). Negative for congestion.   Respiratory:  Positive for cough (minimal), shortness of breath (a little) and wheezing.   Cardiovascular:  Positive for leg swelling (R foot). Negative for chest pain and palpitations.  Neurological:  Positive for dizziness, light-headedness and headaches.       Objective:   Vitals:   03/12/23 1525  BP: 124/86  Pulse: 100  Temp: 98.3 F (36.8 C)  SpO2: 91%  BP Readings from Last 3 Encounters:  03/12/23 124/86  11/18/22 (!) 130/90  11/04/22 118/76   Wt Readings from Last 3 Encounters:  03/12/23 267 lb (121.1 kg)  11/18/22 266 lb (120.7 kg)  11/04/22 266 lb (120.7 kg)   Body mass index is 44.43 kg/m.    Physical Exam     Lab Results  Component Value Date   WBC 5.8 11/04/2022   HGB 12.5 11/04/2022   HCT 39.3 11/04/2022   PLT 280.0 11/04/2022   GLUCOSE 163 (H) 11/04/2022   CHOL 149 11/04/2022   TRIG 79.0 11/04/2022   HDL 39.60 11/04/2022   LDLCALC 93 11/04/2022   ALT 17 11/04/2022   AST 16 11/04/2022   NA 137 11/04/2022   K 3.4 (L) 11/04/2022   CL 100 11/04/2022   CREATININE 0.94 11/04/2022   BUN 8 11/04/2022   CO2 29 11/04/2022   TSH 1.84  08/06/2020   HGBA1C 7.9 (H) 11/04/2022   MICROALBUR 4.6 (H) 11/04/2022     Assessment & Plan:    See Problem List for Assessment and Plan of chronic medical problems.

## 2023-03-11 NOTE — Patient Instructions (Addendum)
     Your A1c was checked.    Medications changes include :   amoxicillin for your sinus infection      Return in about 4 months (around 07/13/2023) for follow up.

## 2023-03-12 ENCOUNTER — Ambulatory Visit (INDEPENDENT_AMBULATORY_CARE_PROVIDER_SITE_OTHER): Payer: 59 | Admitting: Internal Medicine

## 2023-03-12 VITALS — BP 124/86 | HR 100 | Temp 98.3°F | Ht 65.0 in | Wt 267.0 lb

## 2023-03-12 DIAGNOSIS — I10 Essential (primary) hypertension: Secondary | ICD-10-CM | POA: Diagnosis not present

## 2023-03-12 DIAGNOSIS — J019 Acute sinusitis, unspecified: Secondary | ICD-10-CM | POA: Diagnosis not present

## 2023-03-12 DIAGNOSIS — E782 Mixed hyperlipidemia: Secondary | ICD-10-CM | POA: Diagnosis not present

## 2023-03-12 DIAGNOSIS — E1165 Type 2 diabetes mellitus with hyperglycemia: Secondary | ICD-10-CM | POA: Diagnosis not present

## 2023-03-12 DIAGNOSIS — T466X5A Adverse effect of antihyperlipidemic and antiarteriosclerotic drugs, initial encounter: Secondary | ICD-10-CM

## 2023-03-12 DIAGNOSIS — G720 Drug-induced myopathy: Secondary | ICD-10-CM

## 2023-03-12 DIAGNOSIS — Z7984 Long term (current) use of oral hypoglycemic drugs: Secondary | ICD-10-CM | POA: Diagnosis not present

## 2023-03-12 DIAGNOSIS — Z7985 Long-term (current) use of injectable non-insulin antidiabetic drugs: Secondary | ICD-10-CM

## 2023-03-12 LAB — POCT GLYCOSYLATED HEMOGLOBIN (HGB A1C): Hemoglobin A1C: 7.2 % — AB (ref 4.0–5.6)

## 2023-03-12 MED ORDER — FLUTICASONE PROPIONATE 50 MCG/ACT NA SUSP: 2.0000 | Freq: Every day | NASAL | 6 refills | Status: DC

## 2023-03-12 MED ORDER — AMOXICILLIN 500 MG PO CAPS: 500.0000 mg | ORAL_CAPSULE | Freq: Three times a day (TID) | ORAL | 0 refills | Status: AC

## 2023-03-12 MED ORDER — GLIPIZIDE 10 MG PO TABS: ORAL_TABLET | ORAL | 3 refills | Status: DC

## 2023-03-12 MED ORDER — MONTELUKAST SODIUM 10 MG PO TABS: ORAL_TABLET | ORAL | 1 refills | Status: DC

## 2023-03-12 MED ORDER — AMLODIPINE BESYLATE 10 MG PO TABS: 10.0000 mg | ORAL_TABLET | Freq: Every day | ORAL | 1 refills | Status: DC

## 2023-03-12 MED ORDER — LINACLOTIDE 145 MCG PO CAPS: ORAL_CAPSULE | ORAL | 1 refills | Status: DC

## 2023-03-12 MED ORDER — METFORMIN HCL ER 500 MG PO TB24: ORAL_TABLET | ORAL | 1 refills | Status: DC

## 2023-03-12 MED ORDER — POTASSIUM CHLORIDE ER 10 MEQ PO CPCR: 10.0000 meq | ORAL_CAPSULE | Freq: Two times a day (BID) | ORAL | 1 refills | Status: DC

## 2023-03-12 MED ORDER — ALBUTEROL SULFATE HFA 108 (90 BASE) MCG/ACT IN AERS: INHALATION_SPRAY | RESPIRATORY_TRACT | 11 refills | Status: DC

## 2023-03-12 MED ORDER — TIRZEPATIDE 5 MG/0.5ML ~~LOC~~ SOAJ: 5.0000 mg | SUBCUTANEOUS | 0 refills | Status: DC

## 2023-03-12 NOTE — Assessment & Plan Note (Signed)
Acute Likely bacterial  Start amoxicillin 500 mg 3 times daily x 10 day otc cold medications Rest, fluid Call if no improvement

## 2023-03-12 NOTE — Assessment & Plan Note (Signed)
Chronic BP controlled Working on weight loss Continue amlodipine 10 mg daily,

## 2023-03-12 NOTE — Assessment & Plan Note (Signed)
Chronic Regular exercise and healthy diet encouraged  Statin intolerant-has tried a few Lab Results  Component Value Date   LDLCALC 93 11/04/2022   She is working on weight loss which will help, sugars getting better controlled Would like to hold off on any cholesterol medication

## 2023-03-12 NOTE — Assessment & Plan Note (Signed)
H/o statin myopathy Intolerance of statins - has tried more than one

## 2023-03-12 NOTE — Assessment & Plan Note (Signed)
Chronic  Lab Results  Component Value Date   HGBA1C 7.2 (A) 03/12/2023   Sugars improved not ideally controlled Continue glipizide 20 mg twice daily, metformin XR 1000 mg twice daily.  Mounajro 2.5 mg weekly-will increase to 5 mg weekly with her next prescription Stressed compliance with medication Continue diabetic diet Continue weight loss efforts

## 2023-03-23 ENCOUNTER — Other Ambulatory Visit: Payer: Self-pay

## 2023-03-23 ENCOUNTER — Encounter: Payer: Self-pay | Admitting: Internal Medicine

## 2023-03-23 ENCOUNTER — Ambulatory Visit: Payer: 59 | Admitting: Internal Medicine

## 2023-03-23 VITALS — BP 144/82 | HR 90 | Temp 98.0°F | Ht 65.0 in

## 2023-03-23 DIAGNOSIS — R300 Dysuria: Secondary | ICD-10-CM | POA: Diagnosis not present

## 2023-03-23 DIAGNOSIS — M545 Low back pain, unspecified: Secondary | ICD-10-CM

## 2023-03-23 LAB — POC URINALSYSI DIPSTICK (AUTOMATED)
Bilirubin, UA: NEGATIVE
Blood, UA: NEGATIVE
Glucose, UA: NEGATIVE
Ketones, UA: NEGATIVE
Leukocytes, UA: NEGATIVE
Nitrite, UA: NEGATIVE
Protein, UA: NEGATIVE
Spec Grav, UA: 1.015 (ref 1.010–1.025)
Urobilinogen, UA: 0.2 U/dL
pH, UA: 6.5 (ref 5.0–8.0)

## 2023-03-23 MED ORDER — TIZANIDINE HCL 2 MG PO TABS: 2.0000 mg | ORAL_TABLET | Freq: Two times a day (BID) | ORAL | 0 refills | Status: DC | PRN

## 2023-03-23 NOTE — Progress Notes (Signed)
Subjective:    Patient ID: Lindsay Hardy, female    DOB: [birth year], 72 y.o.   MRN: [id number]      HPI Lindsay Hardy is here for  Chief Complaint  Patient presents with   Back Pain    Right sided back pain     Right sided back pain - moved toward the mid back where her chronic pain is and then moved back to right lower back.  Chronic pain medication is not helping.  ? Spams.    No radiation. No n/t  Pain started three days ago she went to church and when she stood up she was bent over which is not new.  Her pain got worse yesterday and was worse even yesterday.    Pain was worse with movement.    Medications and allergies reviewed with patient and updated if appropriate.  Current Outpatient Medications on File Prior to Visit  Medication Sig Dispense Refill   ACCU-CHEK GUIDE test strip USE UP TO FOUR TIMES DAILY AS DIRECTED 100 strip 5   Accu-Chek Softclix Lancets lancets USE UP TO FOUR TIMES DAILY AS DIRECTED 100 each 1   albuterol (VENTOLIN HFA) 108 (90 Base) MCG/ACT inhaler INHALE 2 PUFFS INTO THE LUNGS EVERY 4 (FOUR) HOURS AS NEEDED FOR SHORTNESS OF BREATH. 6.7 each 11   amLODipine (NORVASC) 10 MG tablet Take 1 tablet (10 mg total) by mouth daily. 90 tablet 1   aspirin 81 MG EC tablet TAKE 1 TABLET (81 MG TOTAL) BY MOUTH DAILY. 90 tablet 1   Blood Glucose Monitoring Suppl (ONETOUCH VERIO IQ SYSTEM) w/Device KIT Use to check blood sugar once to twice daily. 1 kit 0   fluticasone (FLONASE) 50 MCG/ACT nasal spray Place 2 sprays into both nostrils daily. 16 g 6   glipiZIDE (GLUCOTROL) 10 MG tablet TAKE 2 TABLETS BY MOUTH 2 TIMES DAILY BEFORE A MEAL. 360 tablet 3   HYDROcodone-acetaminophen (NORCO) 10-325 MG tablet SMARTSIG:1 Tablet(s) By Mouth 4-6 Times Daily     ibuprofen (ADVIL) 800 MG tablet TAKE 1 TABLET BY MOUTH 2 TIMES DAILY AS NEEDED FOR MODERATE PAIN. 20 tablet 0   linaclotide (LINZESS) 145 MCG CAPS capsule TAKE 1 CAPSULE BY MOUTH DAILY BEFORE BREAKFAST. 90 capsule 1    metFORMIN (GLUCOPHAGE-XR) 500 MG 24 hr tablet TAKE 2 TABLETS BY MOUTH 2 TIMES DAILY BEFORE A MEAL. 360 tablet 1   Misc. Devices (BARIATRIC ROLLATOR) MISC Use as directed.  Dx  M54.9 1 each 0   montelukast (SINGULAIR) 10 MG tablet TAKE 1 TABLET BY MOUTH EVERYDAY AT BEDTIME 90 tablet 1   naloxone (NARCAN) nasal spray 4 mg/0.1 mL 1 spray in one nostril x1; may repeat dose in alternate nostrils q2-51min prn 1 each 1   potassium chloride (MICRO-K) 10 MEQ CR capsule Take 1 capsule (10 mEq total) by mouth 2 (two) times daily. 180 capsule 1   RESTASIS 0.05 % ophthalmic emulsion      tirzepatide (MOUNJARO) 5 MG/0.5ML Pen Inject 5 mg into the skin once a week. 2 mL 0   No current facility-administered medications on file prior to visit.    Review of Systems  Constitutional:  Negative for fever.  Gastrointestinal:  Positive for abdominal pain (chronic).  Genitourinary:  Positive for dysuria (slight). Negative for frequency, hematuria and urgency.       Objective:   Vitals:   03/23/23 1614  BP: (!) 144/82  Pulse: 90  Temp: 98 F (36.7 C)  SpO2: 96%   BP  Readings from Last 3 Encounters:  03/23/23 (!) 144/82  03/12/23 124/86  11/18/22 (!) 130/90   Wt Readings from Last 3 Encounters:  03/12/23 267 lb (121.1 kg)  11/18/22 266 lb (120.7 kg)  11/04/22 266 lb (120.7 kg)   Body mass index is 44.43 kg/m.    Physical Exam Constitutional:      General: She is not in acute distress.    Appearance: Normal appearance. She is not ill-appearing.  HENT:     Head: Normocephalic and atraumatic.  Musculoskeletal:        General: Tenderness (tenderness across lower back) present. No swelling or deformity.  Skin:    General: Skin is warm and dry.  Neurological:     Mental Status: She is alert. Mental status is at baseline.     Sensory: No sensory deficit.  Psychiatric:        Mood and Affect: Mood normal.        Behavior: Behavior normal.        Thought Content: Thought content normal.         Judgment: Judgment normal.            Assessment & Plan:    See Problem List for Assessment and Plan of chronic medical problems.

## 2023-03-23 NOTE — Patient Instructions (Addendum)
       Medications changes include :   tizanidine 2-4 mg twice a day as needed    A referral was ordered for PT and someone will call you to schedule an appointment.     Return if symptoms worsen or fail to improve.

## 2023-03-23 NOTE — Assessment & Plan Note (Signed)
Acute on chronic back pain Has chronic mid back pain that she follows with pain management for New pain is in the right lower back which did radiate across the lower back and is now focused again in the right lower back No radiation, numbness or tingling Possibly muscle spasm Start tizanidine 2-4 mg twice daily-discussed this can cause drowsiness and she needs to use with caution Referral to physical therapy Call if no improvement

## 2023-03-23 NOTE — Assessment & Plan Note (Signed)
Acute Mild Concerned the back pain is related to a possible UTI Urine dip here negative for infection Will send urine for culture

## 2023-03-24 LAB — URINE CULTURE: Result:: NO GROWTH

## 2023-04-12 ENCOUNTER — Ambulatory Visit: Payer: 59 | Admitting: Physical Therapy

## 2023-04-12 DIAGNOSIS — M62830 Muscle spasm of back: Secondary | ICD-10-CM | POA: Diagnosis not present

## 2023-04-12 DIAGNOSIS — M5136 Other intervertebral disc degeneration, lumbar region with discogenic back pain only: Secondary | ICD-10-CM | POA: Diagnosis not present

## 2023-04-12 DIAGNOSIS — M170 Bilateral primary osteoarthritis of knee: Secondary | ICD-10-CM | POA: Diagnosis not present

## 2023-04-12 DIAGNOSIS — G8929 Other chronic pain: Secondary | ICD-10-CM | POA: Diagnosis not present

## 2023-04-12 DIAGNOSIS — Z79891 Long term (current) use of opiate analgesic: Secondary | ICD-10-CM | POA: Diagnosis not present

## 2023-04-12 DIAGNOSIS — Z79899 Other long term (current) drug therapy: Secondary | ICD-10-CM | POA: Diagnosis not present

## 2023-04-12 DIAGNOSIS — G894 Chronic pain syndrome: Secondary | ICD-10-CM | POA: Diagnosis not present

## 2023-04-15 ENCOUNTER — Other Ambulatory Visit: Payer: Self-pay

## 2023-04-15 ENCOUNTER — Ambulatory Visit: Payer: 59 | Attending: Internal Medicine

## 2023-04-15 DIAGNOSIS — M545 Low back pain, unspecified: Secondary | ICD-10-CM | POA: Insufficient documentation

## 2023-04-15 DIAGNOSIS — M5459 Other low back pain: Secondary | ICD-10-CM | POA: Insufficient documentation

## 2023-04-15 NOTE — Therapy (Addendum)
OUTPATIENT PHYSICAL THERAPY THORACOLUMBAR EVALUATION   Patient Name: Lindsay Hardy MRN: [id number] DOB:May 01, 1951, 72 y.o., female Today's Date: 04/15/2023  END OF SESSION:    04/15/23 1235  PT Visits / Re-Eval  Visit Number 1  Number of Visits 9  Date for PT Re-Evaluation 06/08/23  PT Time Calculation  PT Start Time 1230  PT Stop Time 1315  PT Time Calculation (min) 45 min  PT - End of Session  Activity Tolerance Patient tolerated treatment well  Behavior During Therapy [facility] for tasks assessed/performed    Past Medical History:  Diagnosis Date   Asthma    CAD (coronary artery disease) 11/2003    RCA 60% stenosis, LAD diffuse   Chicken pox    CVA (cerebrovascular accident) (HCC) 2001    right frontoparietal cortical CVA - MRI September 2001   Diabetes mellitus    Diastolic dysfunction     ejection fraction 55-65% on 2-D echo May 2006   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 Leone Payor   Granulomatous disease, chronic (HCC)     in right upper lobe per x-ray December 2005   Hyperlipidemia    Hypertension    NAFLD (nonalcoholic fatty liver disease)     based on ultrasound June 2002, elevated transaminase   OA (osteoarthritis)     DJP coracoclavicular ligament, left patella osteophytes June 2002, L4-S1 spondylosis, degenerative disc disease with bulge. I in October 2001, the C2-C4 spondylosis without stenosis and with spurs based on x-ray July 2005; diffuse idiopathic skeletal hyperostosis with bilateral hip lumbar degenerative changes   on x-ray February 2005, bilateral plantar calcaneal spurs   Reactive airway disease     peak flow 180 in May 2005, chronic sinusitis with PND, bronchitis December 2002  and strep pneumonia April 2007,  FEC 66, MCV 60, ratio is 69, DLCO 57,  moderate restrictive and mild obstructive disease  on spirometry May 2005   Past Surgical History:  Procedure Laterality Date   CARPAL TUNNEL RELEASE  1992    left arm 1992   CHOLECYSTECTOMY  2003   TONSILLECTOMY AND ADENOIDECTOMY     Patient Active Problem List   Diagnosis Date Noted   Bilateral low back pain without sciatica 03/23/2023   Asthma 11/04/2022   B12 deficiency 11/03/2022   Vaginal candidiasis 05/06/2022   Statin myopathy 01/15/2022   Aortic atherosclerosis (HCC) 11/05/2020   Arthropathy of lumbar facet joint 02/27/2020   Allergic rhinitis 11/22/2018   External hemorrhoids 10/07/2018   Chronic constipation 10/07/2018   Morbid obesity (HCC) 04/01/2017   PAD (peripheral artery disease) (HCC) 10/29/2016   Chronic back pain 09/23/2016   Diabetic peripheral neuropathy (HCC) 02/05/2016   Dysuria 01/14/2016   Diabetes (HCC) 06/17/2006   Hyperlipidemia 06/17/2006   Essential hypertension 06/17/2006   Coronary atherosclerosis 06/17/2006   DIASTOLIC DYSFUNCTION 06/17/2006   DISEASE, CEREBROVASCULAR NEC 06/17/2006   Osteoarthritis of both knees 06/17/2006    PCP: Blaine Hamper, MD  REFERRING PROVIDER: Dr. Cheryll Cockayne  REFERRING DIAG: M54.50 (ICD-10-CM) - Bilateral low back pain without sciatica, unspecified chronicity   Rationale for Evaluation and Treatment: Rehabilitation  THERAPY DIAG:  Other low back pain  ONSET DATE: 03/23/2023  SUBJECTIVE:  SUBJECTIVE STATEMENT: Pt reports of chronic back pain since 2015. Pt denies any injuries or surgeries to her back. Pain is localized to her lower back and doesn't travel down to her leg.Pt denies any numbness or tingling. Pt reports she has an aide that comes about 2  hours a day (80 hours a month)  PERTINENT HISTORY:  DM  PAIN:  Are you having pain? Yes: NPRS scale: 10/10 Pain location: lower back Pain description: throbbing, achy, sharp Aggravating factors: standing, walking Relieving factors: lying  PRECAUTIONS: None  RED FLAGS: None   WEIGHT BEARING RESTRICTIONS: No  FALLS:  Has patient fallen in last 6 months? No  LIVING ENVIRONMENT: Lives with:  friend Lives in: House/apartment Stairs: Yes: External: 3 steps; on right going up, on left going up, and can reach both Has following equipment at home: Single point cane and Walker - 4 wheeled  OCCUPATION: retired  PLOF: Requires assistive device for independence, Needs assistance with ADLs, and Needs assistance with homemaking  PATIENT GOALS: to be able to walk longer and to ease the pain    OBJECTIVE:  Note: Objective measures were completed at Evaluation unless otherwise noted.  DIAGNOSTIC FINDINGS:  2021 X ray of lumbar spine: FINDINGS:  Normal alignment. Negative for fracture or mass. Mild disc  degeneration throughout the lumbar spine with mild disc space  narrowing and mild anterior and lateral spurring. Moderate disc  degeneration L5-S1. Negative for pars defect   Calcified uterine fibroids.   Degenerative change in both hips with mild joint space narrowing and  spurring. Spurring of the anterior iliac spine by bilaterally. SI  joints negative   IMPRESSION:  Mild lumbar spine degenerative change.   Mild degenerative change in both hip joints. Degenerative  calcification of the anteriorly ax spine bilaterally in the greater  trochanter bilaterally   Calcified uterine fibroids.   Atherosclerotic aorta without aneurysm.    COGNITION: Overall cognitive status: Within functional limits for tasks assessed    LUMBAR ROM:   AROM eval  Flexion   Extension   Right lateral flexion   Left lateral flexion   Right rotation   Left rotation    (Blank rows = not  tested)  LOWER EXTREMITY ROM:     Active  Right eval Left eval  Hip flexion    Hip extension    Hip abduction    Hip adduction    Hip internal rotation    Hip external rotation    Knee flexion    Knee extension    Ankle dorsiflexion    Ankle plantarflexion    Ankle inversion    Ankle eversion     (Blank rows = not tested)  LOWER EXTREMITY MMT:    MMT Right eval Left eval  Hip flexion    Hip extension    Hip abduction    Hip adduction    Hip internal rotation    Hip external rotation    Knee flexion    Knee extension    Ankle dorsiflexion    Ankle plantarflexion    Ankle inversion    Ankle eversion     (Blank rows = not tested)  FUNCTIONAL TESTS:  10 meter walk test: 0.43 m/s   Attempted to do 2 min walk test but pt unable to perform. Pt able to walk 115' in 1 min 5 sec. Pt had 4-5/10 pain before the start of walk and after walking 115', she reported pain to be 8/10 at end of the session.  GAIT: Distance walked: 115' Assistive device utilized: Environmental consultant - 4 wheeled Level of assistance: SBA   TODAY'S TREATMENT:                                                                                                                              DATE:    Lower trunk rotation: 20x Pt initially had pain. Pt was educated on modifying exercise to go in the range that is comfortable and pain free and avoid painful range. Pt educated on purpose of this exercise is to improve hip and lumbar flexiility and improve pain. Pt can do this to her tolerance for frequency and duration.   PATIENT EDUCATION:  Education details: see above Person educated: Patient Education method: Explanation Education comprehension: verbalized understanding  HOME EXERCISE PROGRAM: Access Code: [email or web address] URL: [email or web address] Date: 04/15/2023 Prepared by: Lavone Nian  Exercises - Supine Lower Trunk Rotation  - 1-3 x daily - 7 x weekly - 2 sets - 10 reps  ASSESSMENT:  CLINICAL  IMPRESSION: Patient is a 72 y.o. female who was seen today for physical therapy evaluation and treatment for chronic lower back pain. Patient demonstrates decreased gait speed and decreased gait endurance. Pt has very limited ability to ambulate even with RW due to pain. Patient will benefit from skilled PT to improve her pain, improve her gait speed and gait endurance.    OBJECTIVE IMPAIRMENTS: Abnormal gait, decreased balance, decreased endurance, decreased mobility, decreased ROM, decreased strength, hypomobility, increased muscle spasms, impaired flexibility, postural dysfunction, and pain.   ACTIVITY LIMITATIONS: carrying, lifting, bending, standing, stairs, transfers, bathing, toileting, dressing, and hygiene/grooming  PARTICIPATION LIMITATIONS: meal prep, cleaning, laundry, shopping, and community activity  PERSONAL FACTORS: Age and Time since onset of injury/illness/exacerbation are also affecting patient's functional outcome.   REHAB POTENTIAL: Fair chronic condition  CLINICAL DECISION MAKING: Stable/uncomplicated  EVALUATION COMPLEXITY: Low   GOALS: Goals reviewed with patient? Yes  SHORT TERM GOALS: Target date: 05/13/23  Pt will demo 50 % compliance with HEP to self manage her symptoms Baseline:Issued on 04/15/23 Goal status: INITIAL   LONG TERM GOALS: Target date: 06/08/23  Pt will report 100% compliance with HEP to self manage her symptoms. Baseline:  Goal status: INITIAL  2.  Pt will demo gait speed of >0.55 m/s to decrease fall risk and improve functional ambulation Baseline: 0.43 m/s 04/15/23 Goal status: INITIAL  3.  Pt will be able to ambulate for 5' with cane to improve functional amulation. Baseline: only able to ambulate 42' with st. Cane and had to stop due to back pain (04/15/23) Goal status: INITIAL   PLAN:  PT FREQUENCY: 2x/week  PT DURATION: 8 weeks  PLANNED INTERVENTIONS: [id number]- PT Re-evaluation, 97110-Therapeutic exercises, 97530- Therapeutic  activity, 97112- Neuromuscular re-education, 97535- Self Care, 28413- Manual therapy, [phone]- Gait training, Patient/Family education, Balance training, Stair training, Joint mobilization, Spinal mobilization, Cryotherapy, and Moist heat.  PLAN FOR NEXT SESSION:    Ileana Ladd, PT 04/15/2023,  12:36 PM

## 2023-04-16 ENCOUNTER — Ambulatory Visit (INDEPENDENT_AMBULATORY_CARE_PROVIDER_SITE_OTHER): Payer: 59 | Admitting: Radiology

## 2023-04-16 DIAGNOSIS — Z23 Encounter for immunization: Secondary | ICD-10-CM

## 2023-04-16 NOTE — Progress Notes (Signed)
Patient here today to get HD flu shot. Patient tolerated well with no complications

## 2023-04-22 ENCOUNTER — Ambulatory Visit: Payer: 59

## 2023-04-22 ENCOUNTER — Other Ambulatory Visit: Payer: Self-pay | Admitting: Internal Medicine

## 2023-04-22 DIAGNOSIS — E1165 Type 2 diabetes mellitus with hyperglycemia: Secondary | ICD-10-CM

## 2023-04-22 MED ORDER — BLOOD GLUCOSE TEST VI STRP: ORAL_STRIP | 3 refills | Status: DC

## 2023-04-22 MED ORDER — BLOOD GLUCOSE MONITORING SUPPL DEVI: 0 refills | Status: DC

## 2023-04-22 MED ORDER — LANCET DEVICE MISC: 0 refills | Status: DC

## 2023-04-22 MED ORDER — LANCETS MISC. MISC: 3 refills | Status: DC

## 2023-04-27 ENCOUNTER — Ambulatory Visit: Payer: 59

## 2023-04-27 DIAGNOSIS — M5459 Other low back pain: Secondary | ICD-10-CM | POA: Diagnosis not present

## 2023-04-27 DIAGNOSIS — M545 Low back pain, unspecified: Secondary | ICD-10-CM | POA: Diagnosis not present

## 2023-04-27 NOTE — Therapy (Signed)
OUTPATIENT PHYSICAL THERAPY THORACOLUMBAR EVALUATION   Patient Name: Lindsay Hardy MRN: [id number] DOB:03/23/1951, 72 y.o., female Today's Date: 04/27/2023  END OF SESSION:  PT End of Session - 04/27/23 1408     Visit Number 2    Number of Visits 9    Date for PT Re-Evaluation 06/08/23    PT Start Time 1410    PT Stop Time 1450    PT Time Calculation (min) 40 min    Equipment Utilized During Treatment Gait belt    Activity Tolerance Patient tolerated treatment well    Behavior During Therapy [facility] for tasks assessed/performed             Past Medical History:  Diagnosis Date   Asthma    CAD (coronary artery disease) 11/2003    RCA 60% stenosis, LAD diffuse   Chicken pox    CVA (cerebrovascular accident) (HCC) 2001    right frontoparietal cortical CVA - MRI September 2001   Diabetes mellitus    Diastolic dysfunction     ejection fraction 55-65% on 2-D echo May 2006   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 Leone Payor   Granulomatous disease, chronic (HCC)     in right upper lobe per x-ray December 2005   Hyperlipidemia    Hypertension    NAFLD (nonalcoholic fatty liver disease)     based on ultrasound June 2002, elevated transaminase   OA (osteoarthritis)     DJP coracoclavicular ligament, left patella osteophytes June 2002, L4-S1 spondylosis, degenerative disc disease with bulge. I in October 2001, the C2-C4 spondylosis without stenosis and with spurs based on x-ray July 2005; diffuse idiopathic skeletal hyperostosis with bilateral hip lumbar degenerative changes   on x-ray February 2005, bilateral plantar calcaneal spurs   Reactive airway disease     peak flow 180 in May 2005, chronic sinusitis  with PND, bronchitis December 2002 and strep pneumonia April 2007,  FEC 66, MCV 60, ratio is 69, DLCO 57,  moderate restrictive and mild obstructive disease  on spirometry May 2005   Past Surgical History:  Procedure Laterality Date   CARPAL TUNNEL RELEASE  1992    left arm 1992   CHOLECYSTECTOMY  2003   TONSILLECTOMY AND ADENOIDECTOMY     Patient Active Problem List   Diagnosis Date Noted   Bilateral low back pain without sciatica 03/23/2023   Asthma 11/04/2022   B12 deficiency 11/03/2022   Vaginal candidiasis 05/06/2022   Statin myopathy 01/15/2022   Aortic atherosclerosis (HCC) 11/05/2020   Arthropathy of lumbar facet joint 02/27/2020   Allergic rhinitis 11/22/2018   External hemorrhoids 10/07/2018   Chronic constipation 10/07/2018   Morbid obesity (HCC) 04/01/2017   PAD (peripheral artery disease) (HCC) 10/29/2016   Chronic back pain 09/23/2016   Diabetic peripheral neuropathy (HCC) 02/05/2016   Dysuria 01/14/2016   Diabetes (HCC) 06/17/2006   Hyperlipidemia 06/17/2006   Essential hypertension 06/17/2006   Coronary atherosclerosis 06/17/2006   DIASTOLIC DYSFUNCTION 06/17/2006   DISEASE, CEREBROVASCULAR NEC 06/17/2006   Osteoarthritis of both knees 06/17/2006    PCP: Blaine Hamper, MD  REFERRING PROVIDER: Dr. Cheryll Cockayne  REFERRING DIAG: M54.50 (ICD-10-CM) - Bilateral low back pain without sciatica, unspecified chronicity   Rationale for  Evaluation and Treatment: Rehabilitation  THERAPY DIAG:  Other low back pain  ONSET DATE: 03/23/2023  SUBJECTIVE:                                                                                                                                                                                           SUBJECTIVE STATEMENT: My R foot has been swollen for about a week. Pain in back is 8/10. Pt denies radicular symptoms.   PERTINENT HISTORY:  DM  PAIN:  Are you having pain? Yes: NPRS scale: 8/10 Pain location: lower back Pain  description: throbbing, achy, sharp Aggravating factors: standing, walking Relieving factors: lying  PRECAUTIONS: None  RED FLAGS: None   WEIGHT BEARING RESTRICTIONS: No  FALLS:  Has patient fallen in last 6 months? No  LIVING ENVIRONMENT: Lives with:  friend Lives in: House/apartment Stairs: Yes: External: 3 steps; on right going up, on left going up, and can reach both Has following equipment at home: Single point cane and Walker - 4 wheeled  OCCUPATION: retired  PLOF: Requires assistive device for independence, Needs assistance with ADLs, and Needs assistance with homemaking  PATIENT GOALS: to be able to walk longer and to ease the pain    OBJECTIVE:  Note: Objective measures were completed at Evaluation unless otherwise noted.  DIAGNOSTIC FINDINGS:  2021 X ray of lumbar spine: FINDINGS:  Normal alignment. Negative for fracture or mass. Mild disc  degeneration throughout the lumbar spine with mild disc space  narrowing and mild anterior and lateral spurring. Moderate disc  degeneration L5-S1. Negative for pars defect   Calcified uterine fibroids.   Degenerative change in both hips with mild joint space narrowing and  spurring. Spurring of the anterior iliac spine by bilaterally. SI  joints negative   IMPRESSION:  Mild lumbar spine degenerative change.   Mild degenerative change in both hip joints. Degenerative  calcification of the anteriorly ax spine bilaterally in the greater  trochanter bilaterally   Calcified uterine fibroids.   Atherosclerotic aorta without aneurysm.    COGNITION: Overall cognitive status: Within functional limits for tasks assessed    LUMBAR ROM:   AROM eval  Flexion   Extension   Right lateral flexion   Left lateral flexion   Right rotation   Left rotation    (Blank rows = not tested)  LOWER EXTREMITY ROM:     Active  Right eval Left eval  Hip flexion    Hip extension    Hip abduction    Hip adduction    Hip  internal rotation    Hip external rotation    Knee flexion  Knee extension    Ankle dorsiflexion    Ankle plantarflexion    Ankle inversion    Ankle eversion     (Blank rows = not tested)  LOWER EXTREMITY MMT:    MMT Right eval Left eval  Hip flexion    Hip extension    Hip abduction    Hip adduction    Hip internal rotation    Hip external rotation    Knee flexion    Knee extension    Ankle dorsiflexion    Ankle plantarflexion    Ankle inversion    Ankle eversion     (Blank rows = not tested)  FUNCTIONAL TESTS:  10 meter walk test: 0.43 m/s   Attempted to do 2 min walk test but pt unable to perform. Pt able to walk 115' in 1 min 5 sec. Pt had 4-5/10 pain before the start of walk and after walking 115', she reported pain to be 8/10 at end of the session. GAIT: Distance walked: 115' Assistive device utilized: Environmental consultant - 4 wheeled Level of assistance: SBA   TODAY'S TREATMENT:                                                                                                                              DATE:   Tender to touch over R ATFL, pitting edema in R foot below the ankle Supine ankle pumps into plantarflexion/dorsiflexion, inversion, eversion: 20x each Supine SAQ: 20x Supine SLR: 2 x 10 R and L Supine lower trunk rotations: 10x 10" holds Supine passive hamstring stretching: 3 x 30" R and L Resisted walking with green band: 2 x 115' with seated break   PATIENT EDUCATION:  Education details: see above Person educated: Patient Education method: Explanation Education comprehension: verbalized understanding  HOME EXERCISE PROGRAM: Access Code: [email or web address] URL: [email or web address] Date: 04/15/2023 Prepared by: Lavone Nian  Exercises - Supine Lower Trunk Rotation  - 1-3 x daily - 7 x weekly - 2 sets - 10 reps  ASSESSMENT:  CLINICAL IMPRESSION: Pt reported ankle felt better at end of the session. Decreased sweling was visibly noted at end of  the session. PT able to perform resisted walking 2 x 115' at end of the session which is an improvement from her evaluation.  OBJECTIVE IMPAIRMENTS: Abnormal gait, decreased balance, decreased endurance, decreased mobility, decreased ROM, decreased strength, hypomobility, increased muscle spasms, impaired flexibility, postural dysfunction, and pain.   ACTIVITY LIMITATIONS: carrying, lifting, bending, standing, stairs, transfers, bathing, toileting, dressing, and hygiene/grooming  PARTICIPATION LIMITATIONS: meal prep, cleaning, laundry, shopping, and community activity  PERSONAL FACTORS: Age and Time since onset of injury/illness/exacerbation are also affecting patient's functional outcome.   REHAB POTENTIAL: Fair chronic condition  CLINICAL DECISION MAKING: Stable/uncomplicated  EVALUATION COMPLEXITY: Low   GOALS: Goals reviewed with patient? Yes  SHORT TERM GOALS: Target date: 05/13/23  Pt will demo 50 % compliance with HEP to self manage her symptoms Baseline:Issued on 04/15/23 Goal status: INITIAL  LONG TERM GOALS: Target date: 06/08/23  Pt will report 100% compliance with HEP to self manage her symptoms. Baseline:  Goal status: INITIAL  2.  Pt will demo gait speed of >0.55 m/s to decrease fall risk and improve functional ambulation Baseline: 0.43 m/s 04/15/23 Goal status: INITIAL  3.  Pt will be able to ambulate for 5' with cane to improve functional amulation. Baseline: only able to ambulate 36' with st. Cane and had to stop due to back pain (04/15/23) Goal status: INITIAL   PLAN:  PT FREQUENCY: 2x/week  PT DURATION: 8 weeks  PLANNED INTERVENTIONS: 97164- PT Re-evaluation, 97110-Therapeutic exercises, 97530- Therapeutic activity, 97112- Neuromuscular re-education, 97535- Self Care, 32355- Manual therapy, [phone]- Gait training, Patient/Family education, Balance training, Stair training, Joint mobilization, Spinal mobilization, Cryotherapy, and Moist heat.  PLAN FOR  NEXT SESSION:    Ileana Ladd, PT 04/27/2023, 2:43 PM

## 2023-04-27 NOTE — Addendum Note (Signed)
Addended by: Ileana Ladd on: 04/27/2023 02:45 PM   Modules accepted: Orders

## 2023-04-29 ENCOUNTER — Ambulatory Visit: Payer: 59

## 2023-05-03 ENCOUNTER — Other Ambulatory Visit: Payer: Self-pay | Admitting: Internal Medicine

## 2023-05-04 ENCOUNTER — Ambulatory Visit: Payer: 59 | Admitting: Physical Therapy

## 2023-05-04 ENCOUNTER — Encounter: Payer: Self-pay | Admitting: Physical Therapy

## 2023-05-04 VITALS — BP 139/79 | HR 100

## 2023-05-04 DIAGNOSIS — M545 Low back pain, unspecified: Secondary | ICD-10-CM | POA: Diagnosis not present

## 2023-05-04 DIAGNOSIS — M5459 Other low back pain: Secondary | ICD-10-CM

## 2023-05-04 MED ORDER — TIRZEPATIDE 5 MG/0.5ML ~~LOC~~ SOAJ: 5.0000 mg | SUBCUTANEOUS | 0 refills | Status: DC

## 2023-05-04 MED ORDER — IBUPROFEN 800 MG PO TABS: ORAL_TABLET | ORAL | 0 refills | Status: DC

## 2023-05-04 NOTE — Therapy (Signed)
OUTPATIENT PHYSICAL THERAPY THORACOLUMBAR TREATMENT   Patient Name: Lindsay Hardy MRN: [id number] DOB:1951-03-14, 72 y.o., female Today's Date: 05/04/2023  END OF SESSION:  PT End of Session - 05/04/23 1542     Visit Number 3    Number of Visits 9    Date for PT Re-Evaluation 06/08/23    PT Start Time 1530    PT Stop Time 1615    PT Time Calculation (min) 45 min    Equipment Utilized During Treatment Gait belt    Activity Tolerance Patient tolerated treatment well    Behavior During Therapy [facility] for tasks assessed/performed             Past Medical History:  Diagnosis Date   Asthma    CAD (coronary artery disease) 11/2003    RCA 60% stenosis, LAD diffuse   Chicken pox    CVA (cerebrovascular accident) (HCC) 2001    right frontoparietal cortical CVA - MRI September 2001   Diabetes mellitus    Diastolic dysfunction     ejection fraction 55-65% on 2-D echo May 2006   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 Leone Payor   Granulomatous disease, chronic (HCC)     in right upper lobe per x-ray December 2005   Hyperlipidemia    Hypertension    NAFLD (nonalcoholic fatty liver disease)     based on ultrasound June 2002, elevated transaminase   OA (osteoarthritis)     DJP coracoclavicular ligament, left patella osteophytes June 2002, L4-S1 spondylosis, degenerative disc disease with bulge. I in October 2001, the C2-C4 spondylosis without stenosis and with spurs based on x-ray July 2005; diffuse idiopathic skeletal hyperostosis with bilateral hip lumbar degenerative changes   on x-ray February 2005, bilateral plantar calcaneal spurs   Reactive airway disease     peak flow 180 in May 2005, chronic sinusitis  with PND, bronchitis December 2002 and strep pneumonia April 2007,  FEC 66, MCV 60, ratio is 69, DLCO 57,  moderate restrictive and mild obstructive disease  on spirometry May 2005   Past Surgical History:  Procedure Laterality Date   CARPAL TUNNEL RELEASE  1992    left arm 1992   CHOLECYSTECTOMY  2003   TONSILLECTOMY AND ADENOIDECTOMY     Patient Active Problem List   Diagnosis Date Noted   Bilateral low back pain without sciatica 03/23/2023   Asthma 11/04/2022   B12 deficiency 11/03/2022   Vaginal candidiasis 05/06/2022   Statin myopathy 01/15/2022   Aortic atherosclerosis (HCC) 11/05/2020   Arthropathy of lumbar facet joint 02/27/2020   Allergic rhinitis 11/22/2018   External hemorrhoids 10/07/2018   Chronic constipation 10/07/2018   Morbid obesity (HCC) 04/01/2017   PAD (peripheral artery disease) (HCC) 10/29/2016   Chronic back pain 09/23/2016   Diabetic peripheral neuropathy (HCC) 02/05/2016   Dysuria 01/14/2016   Diabetes (HCC) 06/17/2006   Hyperlipidemia 06/17/2006   Essential hypertension 06/17/2006   Coronary atherosclerosis 06/17/2006   DIASTOLIC DYSFUNCTION 06/17/2006   DISEASE, CEREBROVASCULAR NEC 06/17/2006   Osteoarthritis of both knees 06/17/2006    PCP: Blaine Hamper, MD  REFERRING PROVIDER: Dr. Cheryll Cockayne  REFERRING DIAG: M54.50 (ICD-10-CM) - Bilateral low back pain without sciatica, unspecified chronicity   Rationale for  Evaluation and Treatment: Rehabilitation  THERAPY DIAG:  Other low back pain  ONSET DATE: 03/23/2023  SUBJECTIVE:                                                                                                                                                                                           SUBJECTIVE STATEMENT: My R foot is still swollen (upon assessment there is a line at proximal line of dorsum of right foot where swelling is present).  She states her pain is worse than last session in her back and left leg, but remains  an 8/10.  She has a headache.  Pt requesting to wait to do exercise until her HR comes down.  "I really don't feel like doing anything." PERTINENT HISTORY:  DM  PAIN:  Are you having pain? Yes: NPRS scale: 8/10 Pain location: lower back and left leg; headache (9/10) Pain description: throbbing, achy, sharp Aggravating factors: standing, walking Relieving factors: lying  PRECAUTIONS: None  RED FLAGS: None   WEIGHT BEARING RESTRICTIONS: No  FALLS:  Has patient fallen in last 6 months? No  LIVING ENVIRONMENT: Lives with:  friend Lives in: House/apartment Stairs: Yes: External: 3 steps; on right going up, on left going up, and can reach both Has following equipment at home: Single point cane and Walker - 4 wheeled  OCCUPATION: retired  PLOF: Requires assistive device for independence, Needs assistance with ADLs, and Needs assistance with homemaking  PATIENT GOALS: to be able to walk longer and to ease the pain    OBJECTIVE:  Note: Objective measures were completed at Evaluation unless otherwise noted.  DIAGNOSTIC FINDINGS:  2021 X ray of lumbar spine: FINDINGS:  Normal alignment. Negative for fracture or mass. Mild disc  degeneration throughout the lumbar spine with mild disc space  narrowing and mild anterior and lateral spurring. Moderate disc  degeneration L5-S1. Negative for pars defect   Calcified uterine fibroids.   Degenerative change in both hips with mild joint space narrowing and  spurring. Spurring of the anterior iliac spine by bilaterally. SI  joints negative   IMPRESSION:  Mild lumbar spine degenerative change.   Mild degenerative change in both hip joints. Degenerative  calcification of the anteriorly ax spine bilaterally in the greater  trochanter bilaterally   Calcified uterine fibroids.   Atherosclerotic aorta without aneurysm.    COGNITION: Overall cognitive status: Within functional limits for tasks assessed    LUMBAR ROM:   AROM  eval  Flexion   Extension   Right lateral flexion   Left lateral flexion   Right rotation   Left rotation    (Blank rows =  not tested)  LOWER EXTREMITY ROM:     Active  Right eval Left eval  Hip flexion    Hip extension    Hip abduction    Hip adduction    Hip internal rotation    Hip external rotation    Knee flexion    Knee extension    Ankle dorsiflexion    Ankle plantarflexion    Ankle inversion    Ankle eversion     (Blank rows = not tested)  LOWER EXTREMITY MMT:    MMT Right eval Left eval  Hip flexion    Hip extension    Hip abduction    Hip adduction    Hip internal rotation    Hip external rotation    Knee flexion    Knee extension    Ankle dorsiflexion    Ankle plantarflexion    Ankle inversion    Ankle eversion     (Blank rows = not tested)  FUNCTIONAL TESTS:  10 meter walk test: 0.43 m/s   Attempted to do 2 min walk test but pt unable to perform. Pt able to walk 115' in 1 min 5 sec. Pt had 4-5/10 pain before the start of walk and after walking 115', she reported pain to be 8/10 at end of the session. GAIT: Distance walked: 115' Assistive device utilized: Environmental consultant - 4 wheeled Level of assistance: SBA   TODAY'S TREATMENT:                                                                                                                              DATE:  Vitals:   05/04/23 1541  BP: 139/79  Pulse: 100   Rechecked HR and SpO2:  92bpm and 97%  -Lumbar rollouts forward and laterally x12 each direction, cued for painfree ROM and paced breathing -Seated overhead press w/ march w/ orange theraband 3 sets to tolerance -Seated 4lb rotation taps to mat table 2x16 -Seated LE up and overs w/ 5lb kettlebell for reduced height, pt has heavy swing compensation and functional hip weakness somewhat limited by body habitus -PT placed hot pack on pt's low back during last exercise for improved comfort and tolerance to mobility; assess HR and SpO2 at end of  session:  91bpm and 96%  PATIENT EDUCATION:  Education details: Continue HEP. Person educated: Patient Education method: Explanation Education comprehension: verbalized understanding  HOME EXERCISE PROGRAM: Access Code: [email or web address] URL: [email or web address] Date: 04/15/2023 Prepared by: Lavone Nian  Exercises - Supine Lower Trunk Rotation  - 1-3 x daily - 7 x weekly - 2 sets - 10 reps  ASSESSMENT:  CLINICAL IMPRESSION: Pt is limited by pain today increasing need for monitored rest and time needed to adequately engage to each task.  Focused on increasing core engagement and improving LE strength.  She continues to benefit from skilled PT intervention for improved pain management and low back mobility.  Will continue per POC.  OBJECTIVE IMPAIRMENTS: Abnormal gait,  decreased balance, decreased endurance, decreased mobility, decreased ROM, decreased strength, hypomobility, increased muscle spasms, impaired flexibility, postural dysfunction, and pain.   ACTIVITY LIMITATIONS: carrying, lifting, bending, standing, stairs, transfers, bathing, toileting, dressing, and hygiene/grooming  PARTICIPATION LIMITATIONS: meal prep, cleaning, laundry, shopping, and community activity  PERSONAL FACTORS: Age and Time since onset of injury/illness/exacerbation are also affecting patient's functional outcome.   REHAB POTENTIAL: Fair chronic condition  CLINICAL DECISION MAKING: Stable/uncomplicated  EVALUATION COMPLEXITY: Low   GOALS: Goals reviewed with patient? Yes  SHORT TERM GOALS: Target date: 05/13/23  Pt will demo 50 % compliance with HEP to self manage her symptoms Baseline:Issued on 04/15/23 Goal status: INITIAL   LONG TERM GOALS: Target date: 06/08/23  Pt will report 100% compliance with HEP to self manage her symptoms. Baseline:  Goal status: INITIAL  2.  Pt will demo gait speed of >0.55 m/s to decrease fall risk and improve functional ambulation Baseline: 0.43  m/s 04/15/23 Goal status: INITIAL  3.  Pt will be able to ambulate for 5' with cane to improve functional amulation. Baseline: only able to ambulate 71' with st. Cane and had to stop due to back pain (04/15/23) Goal status: INITIAL   PLAN:  PT FREQUENCY: 2x/week  PT DURATION: 8 weeks  PLANNED INTERVENTIONS: 97164- PT Re-evaluation, 97110-Therapeutic exercises, 97530- Therapeutic activity, 97112- Neuromuscular re-education, 97535- Self Care, 16109- Manual therapy, [phone]- Gait training, Patient/Family education, Balance training, Stair training, Joint mobilization, Spinal mobilization, Cryotherapy, and Moist heat.  PLAN FOR NEXT SESSION: heat pack at end of session?  Modified SKTC stretch, SciFit   Sadie Haber, PT, DPT 05/04/2023, 4:41 PM

## 2023-05-05 ENCOUNTER — Ambulatory Visit: Payer: 59

## 2023-05-10 DIAGNOSIS — Z79891 Long term (current) use of opiate analgesic: Secondary | ICD-10-CM | POA: Diagnosis not present

## 2023-05-10 DIAGNOSIS — G894 Chronic pain syndrome: Secondary | ICD-10-CM | POA: Diagnosis not present

## 2023-05-11 ENCOUNTER — Ambulatory Visit: Payer: 59

## 2023-05-13 ENCOUNTER — Ambulatory Visit: Payer: 59

## 2023-05-17 ENCOUNTER — Ambulatory Visit: Payer: 59 | Admitting: Podiatry

## 2023-05-18 ENCOUNTER — Ambulatory Visit: Payer: 59

## 2023-05-19 ENCOUNTER — Ambulatory Visit: Payer: 59

## 2023-05-20 ENCOUNTER — Ambulatory Visit: Payer: 59 | Attending: Internal Medicine

## 2023-05-20 ENCOUNTER — Ambulatory Visit: Payer: 59

## 2023-05-20 DIAGNOSIS — M5459 Other low back pain: Secondary | ICD-10-CM

## 2023-05-20 NOTE — Therapy (Incomplete)
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 Leone Payor   Granulomatous disease, chronic (HCC)     in right upper lobe per x-ray December 2005   Hyperlipidemia    Hypertension    NAFLD (nonalcoholic fatty liver disease)     based on ultrasound June 2002, elevated transaminase   OA (osteoarthritis)     DJP coracoclavicular ligament, left patella osteophytes June 2002, L4-S1 spondylosis, degenerative disc disease with bulge. I in October 2001, the C2-C4 spondylosis without stenosis and with spurs based on x-ray July 2005; diffuse idiopathic skeletal hyperostosis with bilateral hip lumbar degenerative changes   on x-ray February 2005, bilateral plantar calcaneal spurs   Reactive airway disease     peak flow 180 in May 2005, chronic sinusitis with PND, bronchitis December 2002 and strep pneumonia April 2007,  FEC 66, MCV 60, ratio is 69, DLCO 57,  moderate restrictive and mild obstructive disease  on spirometry May 2005   Past Surgical History:  Procedure Laterality Date   CARPAL TUNNEL RELEASE  1992    left arm 1992   CHOLECYSTECTOMY  2003   TONSILLECTOMY AND ADENOIDECTOMY      Patient Active Problem List   Diagnosis Date Noted   Bilateral low back pain without sciatica 03/23/2023   Asthma 11/04/2022   B12 deficiency 11/03/2022   Vaginal candidiasis 05/06/2022   Statin myopathy 01/15/2022   Aortic atherosclerosis (HCC) 11/05/2020   Arthropathy of lumbar facet joint 02/27/2020   Allergic rhinitis 11/22/2018   External hemorrhoids 10/07/2018   Chronic constipation 10/07/2018   Morbid obesity (HCC) 04/01/2017   PAD (peripheral artery disease) (HCC) 10/29/2016   Chronic back pain 09/23/2016   Diabetic peripheral neuropathy (HCC) 02/05/2016   Dysuria 01/14/2016   Diabetes (HCC) 06/17/2006   Hyperlipidemia 06/17/2006   Essential hypertension 06/17/2006   Coronary atherosclerosis 06/17/2006   DIASTOLIC DYSFUNCTION 06/17/2006   DISEASE, CEREBROVASCULAR NEC 06/17/2006   Osteoarthritis of both knees 06/17/2006    ONSET DATE: ***  REFERRING DIAG: ***  THERAPY DIAG:  No diagnosis found.  Rationale for Evaluation and Treatment: {HABREHAB:27488}  SUBJECTIVE:  SUBJECTIVE STATEMENT: *** Pt accompanied by: {accompnied:27141}  PERTINENT HISTORY: ***  PAIN:  Are you having pain? Yes: NPRS scale: *** Pain location: *** Pain description: *** Aggravating factors: *** Relieving factors: ***     PRECAUTIONS: {Therapy precautions:24002}  RED FLAGS: {PT Red Flags:29287}   WEIGHT BEARING RESTRICTIONS: {Yes ***/No:24003}  FALLS: Has patient fallen in last 6 months? {fallsyesno:27318}  LIVING ENVIRONMENT: Lives with: {OPRC lives with:25569::"lives with their family"} Lives in: {Lives in:25570} Stairs: {opstairs:27293} Has following equipment at home: {Assistive devices:23999}  PLOF: {PLOF:24004}  PATIENT GOALS: ***  OBJECTIVE:  Note: Objective measures were  completed at Evaluation unless otherwise noted.  DIAGNOSTIC FINDINGS: ***  COGNITION: Overall cognitive status: {cognition:24006}   SENSATION: {sensation:27233}  COORDINATION: ***  EDEMA:  {edema:24020}  MUSCLE TONE: {LE tone:25568}  MUSCLE LENGTH: Hamstrings: Right *** deg; Left *** deg Maisie Fus test: Right *** deg; Left *** deg  DTRs:  {DTR SITE:24025}  POSTURE: {posture:25561}  LOWER EXTREMITY ROM:     {AROM/PROM:27142}  Right Eval Left Eval  Hip flexion    Hip extension    Hip abduction    Hip adduction    Hip internal rotation    Hip external rotation    Knee flexion    Knee extension    Ankle dorsiflexion    Ankle plantarflexion    Ankle inversion    Ankle eversion     (Blank rows = not tested)  LOWER EXTREMITY MMT:    MMT Right Eval Left Eval  Hip flexion    Hip extension    Hip abduction    Hip adduction    Hip internal rotation    Hip external rotation    Knee flexion    Knee extension    Ankle dorsiflexion    Ankle plantarflexion    Ankle inversion    Ankle eversion    (Blank rows = not tested)  BED MOBILITY:  {Bed mobility:24027}  TRANSFERS: Assistive device utilized: {Assistive devices:23999}  Sit to stand: {Levels of assistance:24026} Stand to sit: {Levels of assistance:24026} Chair to chair: {Levels of assistance:24026} Floor: {Levels of assistance:24026}  RAMP:  Level of Assistance: {Levels of assistance:24026} Assistive device utilized: {Assistive devices:23999} Ramp Comments: ***  CURB:  Level of Assistance: {Levels of assistance:24026} Assistive device utilized: {Assistive devices:23999} Curb Comments: ***  STAIRS: Level of Assistance: {Levels of assistance:24026} Stair Negotiation Technique: {Stair Technique:27161} with {Rail Assistance:27162} Number of Stairs: ***  Height of Stairs: ***  Comments: ***  GAIT: Gait pattern: {gait characteristics:25376} Distance walked: *** Assistive device utilized:  {Assistive devices:23999} Level of assistance: {Levels of assistance:24026} Comments: ***  FUNCTIONAL TESTS:  {Functional tests:24029}  PATIENT SURVEYS:  {rehab surveys:24030}  TODAY'S TREATMENT:                                                                                                                              DATE: ***    PATIENT EDUCATION: Education details: *** Person educated: {Person educated:25204} Education  method: {Education Method:25205} Education comprehension: {Education Comprehension:25206}  HOME EXERCISE PROGRAM: ***  GOALS: Goals reviewed with patient? {yes/no:20286}  SHORT TERM GOALS: Target date: ***  *** Baseline: Goal status: INITIAL  2.  *** Baseline:  Goal status: INITIAL  3.  *** Baseline:  Goal status: INITIAL  4.  *** Baseline:  Goal status: INITIAL  5.  *** Baseline:  Goal status: INITIAL  6.  *** Baseline:  Goal status: INITIAL  LONG TERM GOALS: Target date: ***  *** Baseline:  Goal status: INITIAL  2.  *** Baseline:  Goal status: INITIAL  3.  *** Baseline:  Goal status: INITIAL  4.  *** Baseline:  Goal status: INITIAL  5.  *** Baseline:  Goal status: INITIAL  6.  *** Baseline:  Goal status: INITIAL  ASSESSMENT:  CLINICAL IMPRESSION: Patient is a *** y.o. *** who was seen today for physical therapy evaluation and treatment for ***.   OBJECTIVE IMPAIRMENTS: {opptimpairments:25111}.   ACTIVITY LIMITATIONS: {activitylimitations:27494}  PARTICIPATION LIMITATIONS: {participationrestrictions:25113}  PERSONAL FACTORS: {Personal factors:25162} are also affecting patient's functional outcome.   REHAB POTENTIAL: {rehabpotential:25112}  CLINICAL DECISION MAKING: {clinical decision making:25114}  EVALUATION COMPLEXITY: {Evaluation complexity:25115}  PLAN:  PT FREQUENCY: {rehab frequency:25116}  PT DURATION: {rehab duration:25117}  PLANNED INTERVENTIONS: {rehab planned  interventions:25118::"97110-Therapeutic exercises","97530- Therapeutic [phone]- Neuromuscular re-education","97535- Self JXBJ","47829- Manual therapy"}  PLAN FOR NEXT SESSION: Ileana Ladd, PT 05/20/2023, 4:14 PM

## 2023-05-20 NOTE — Therapy (Signed)
OUTPATIENT PHYSICAL THERAPY THORACOLUMBAR EVALUATION   Patient Name: Lindsay Hardy MRN: [id number] DOB:Dec 31, 1950, 72 y.o., female Today's Date: 05/20/2023  END OF SESSION:  PT End of Session - 05/20/23 1401     Visit Number 4    Number of Visits 9    Date for PT Re-Evaluation 06/08/23    PT Start Time 1400    PT Stop Time 1445    PT Time Calculation (min) 45 min    Equipment Utilized During Treatment Gait belt    Activity Tolerance Patient tolerated treatment well    Behavior During Therapy [facility] for tasks assessed/performed             Past Medical History:  Diagnosis Date   Asthma    CAD (coronary artery disease) 11/2003    RCA 60% stenosis, LAD diffuse   Chicken pox    CVA (cerebrovascular accident) (HCC) 2001    right frontoparietal cortical CVA - MRI September 2001   Diabetes mellitus    Diastolic dysfunction     ejection fraction 55-65% on 2-D echo May 2006   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 Leone Payor   Granulomatous disease, chronic (HCC)     in right upper lobe per x-ray December 2005   Hyperlipidemia    Hypertension    NAFLD (nonalcoholic fatty liver disease)     based on ultrasound June 2002, elevated transaminase   OA (osteoarthritis)     DJP coracoclavicular ligament, left patella osteophytes June 2002, L4-S1 spondylosis, degenerative disc disease with bulge. I in October 2001, the C2-C4 spondylosis without stenosis and with spurs based on x-ray July 2005; diffuse idiopathic skeletal hyperostosis with bilateral hip lumbar degenerative changes   on x-ray February 2005, bilateral plantar calcaneal spurs   Reactive airway disease     peak flow 180 in May 2005, chronic sinusitis  with PND, bronchitis December 2002 and strep pneumonia April 2007,  FEC 66, MCV 60, ratio is 69, DLCO 57,  moderate restrictive and mild obstructive disease  on spirometry May 2005   Past Surgical History:  Procedure Laterality Date   CARPAL TUNNEL RELEASE  1992    left arm 1992   CHOLECYSTECTOMY  2003   TONSILLECTOMY AND ADENOIDECTOMY     Patient Active Problem List   Diagnosis Date Noted   Bilateral low back pain without sciatica 03/23/2023   Asthma 11/04/2022   B12 deficiency 11/03/2022   Vaginal candidiasis 05/06/2022   Statin myopathy 01/15/2022   Aortic atherosclerosis (HCC) 11/05/2020   Arthropathy of lumbar facet joint 02/27/2020   Allergic rhinitis 11/22/2018   External hemorrhoids 10/07/2018   Chronic constipation 10/07/2018   Morbid obesity (HCC) 04/01/2017   PAD (peripheral artery disease) (HCC) 10/29/2016   Chronic back pain 09/23/2016   Diabetic peripheral neuropathy (HCC) 02/05/2016   Dysuria 01/14/2016   Diabetes (HCC) 06/17/2006   Hyperlipidemia 06/17/2006   Essential hypertension 06/17/2006   Coronary atherosclerosis 06/17/2006   DIASTOLIC DYSFUNCTION 06/17/2006   DISEASE, CEREBROVASCULAR NEC 06/17/2006   Osteoarthritis of both knees 06/17/2006    PCP: Blaine Hamper, MD  REFERRING PROVIDER: Dr. Cheryll Cockayne  REFERRING DIAG: M54.50 (ICD-10-CM) - Bilateral low back pain without sciatica, unspecified chronicity   Rationale for  Evaluation and Treatment: Rehabilitation  THERAPY DIAG:  Other low back pain  ONSET DATE: 03/23/2023  SUBJECTIVE:                                                                                                                                                                                           SUBJECTIVE STATEMENT: I was sick for past few days. I am recovering but feel like my body is weak. I can't lay down today because if I lay down, I cough more and when I get up I get dizzy a little. Pain is 8/10.  PERTINENT HISTORY:   DM  PAIN:  Are you having pain? Yes: NPRS scale: 8/10 Pain location: lower back Pain description: throbbing, achy, sharp Aggravating factors: standing, walking Relieving factors: lying  PRECAUTIONS: None  RED FLAGS: None   WEIGHT BEARING RESTRICTIONS: No  FALLS:  Has patient fallen in last 6 months? No  LIVING ENVIRONMENT: Lives with:  friend Lives in: House/apartment Stairs: Yes: External: 3 steps; on right going up, on left going up, and can reach both Has following equipment at home: Single point cane and Walker - 4 wheeled  OCCUPATION: retired  PLOF: Requires assistive device for independence, Needs assistance with ADLs, and Needs assistance with homemaking  PATIENT GOALS: to be able to walk longer and to ease the pain    OBJECTIVE:  Note: Objective measures were completed at Evaluation unless otherwise noted.  DIAGNOSTIC FINDINGS:  2021 X ray of lumbar spine: FINDINGS:  Normal alignment. Negative for fracture or mass. Mild disc  degeneration throughout the lumbar spine with mild disc space  narrowing and mild anterior and lateral spurring. Moderate disc  degeneration L5-S1. Negative for pars defect   Calcified uterine fibroids.   Degenerative change in both hips with mild joint space narrowing and  spurring. Spurring of the anterior iliac spine by bilaterally. SI  joints negative   IMPRESSION:  Mild lumbar spine degenerative change.   Mild degenerative change in both hip joints. Degenerative  calcification of the anteriorly ax spine bilaterally in the greater  trochanter bilaterally   Calcified uterine fibroids.   Atherosclerotic aorta without aneurysm.    COGNITION: Overall cognitive status: Within functional limits for tasks assessed    LUMBAR ROM:   AROM eval  Flexion   Extension   Right lateral flexion   Left lateral flexion   Right rotation   Left rotation    (Blank rows = not tested)  LOWER EXTREMITY ROM:     Active   Right eval Left eval  Hip flexion    Hip extension    Hip abduction  Hip adduction    Hip internal rotation    Hip external rotation    Knee flexion    Knee extension    Ankle dorsiflexion    Ankle plantarflexion    Ankle inversion    Ankle eversion     (Blank rows = not tested)  LOWER EXTREMITY MMT:    MMT Right eval Left eval  Hip flexion    Hip extension    Hip abduction    Hip adduction    Hip internal rotation    Hip external rotation    Knee flexion    Knee extension    Ankle dorsiflexion    Ankle plantarflexion    Ankle inversion    Ankle eversion     (Blank rows = not tested)  FUNCTIONAL TESTS:  10 meter walk test: 0.43 m/s   Attempted to do 2 min walk test but pt unable to perform. Pt able to walk 115' in 1 min 5 sec. Pt had 4-5/10 pain before the start of walk and after walking 115', she reported pain to be 8/10 at end of the session. GAIT: Distance walked: 115' Assistive device utilized: Environmental consultant - 4 wheeled Level of assistance: SBA   TODAY'S TREATMENT:                                                                                                                              DATE:  SciFit: level 1 for 10'. Pt initially was only able to do 3' and needed couple of rest breaks. Pt was educated to decrease her speed to stretch out time to improve her overall endurance. Pt was able to finish 10' of bike with 4 breaks and controlling pace of her movement.  Seated ball rolls into lumbar flexion: 15x Seated in chair with hot pack in lower back for 20' total- pt performed LAQ with ankle DF for sciatic nerve flossing: 30x R and L       PATIENT EDUCATION:  Education details: see above Person educated: Patient Education method: Explanation Education comprehension: verbalized understanding  HOME EXERCISE PROGRAM: Access Code: [email or web address] URL: [email or web address] Date: 04/15/2023 Prepared by: Lavone Nian  Exercises - Supine Lower Trunk  Rotation  - 1-3 x daily - 7 x weekly - 2 sets - 10 reps  ASSESSMENT:  CLINICAL IMPRESSION: Today's session was short per request of patient. Patient was feeling weak wanted session to be easy going. Pt reported improved flexibility with exercises. Pt has decreased cardiopulmonary endurance evident per SciFit exercise. Pt reported improved pain at end of the session.  OBJECTIVE IMPAIRMENTS: Abnormal gait, decreased balance, decreased endurance, decreased mobility, decreased ROM, decreased strength, hypomobility, increased muscle spasms, impaired flexibility, postural dysfunction, and pain.   ACTIVITY LIMITATIONS: carrying, lifting, bending, standing, stairs, transfers, bathing, toileting, dressing, and hygiene/grooming  PARTICIPATION LIMITATIONS: meal prep, cleaning, laundry, shopping, and community activity  PERSONAL FACTORS: Age and Time since onset of injury/illness/exacerbation are also affecting patient's functional outcome.  REHAB POTENTIAL: Fair chronic condition  CLINICAL DECISION MAKING: Stable/uncomplicated  EVALUATION COMPLEXITY: Low   GOALS: Goals reviewed with patient? Yes  SHORT TERM GOALS: Target date: 05/13/23  Pt will demo 50 % compliance with HEP to self manage her symptoms Baseline:Issued on 04/15/23 Goal status: INITIAL   LONG TERM GOALS: Target date: 06/08/23  Pt will report 100% compliance with HEP to self manage her symptoms. Baseline:  Goal status: INITIAL  2.  Pt will demo gait speed of >0.55 m/s to decrease fall risk and improve functional ambulation Baseline: 0.43 m/s 04/15/23 Goal status: INITIAL  3.  Pt will be able to ambulate for 5' with cane to improve functional amulation. Baseline: only able to ambulate 6' with st. Cane and had to stop due to back pain (04/15/23) Goal status: INITIAL   PLAN:  PT FREQUENCY: 2x/week  PT DURATION: 8 weeks  PLANNED INTERVENTIONS: 97164- PT Re-evaluation, 97110-Therapeutic exercises, 97530- Therapeutic  activity, 97112- Neuromuscular re-education, 97535- Self Care, 40981- Manual therapy, [phone]- Gait training, Patient/Family education, Balance training, Stair training, Joint mobilization, Spinal mobilization, Cryotherapy, and Moist heat.  PLAN FOR NEXT SESSION:    Ileana Ladd, PT 05/20/2023, 2:02 PM

## 2023-05-25 ENCOUNTER — Ambulatory Visit: Payer: 59

## 2023-05-27 ENCOUNTER — Ambulatory Visit: Payer: 59

## 2023-05-27 DIAGNOSIS — M5459 Other low back pain: Secondary | ICD-10-CM | POA: Diagnosis not present

## 2023-05-27 NOTE — Therapy (Signed)
OUTPATIENT PHYSICAL THERAPY THORACOLUMBAR TREATMENT NOTE- RE-CERTIFICATION   Patient Name: Lindsay Hardy MRN: [id number] DOB:Apr 25, 1951, 72 y.o., female Today's Date: 05/27/2023  END OF SESSION:  PT End of Session - 05/27/23 1446     Visit Number 5    Number of Visits 13    Date for PT Re-Evaluation 07/08/23    PT Start Time 1445    PT Stop Time 1530    PT Time Calculation (min) 45 min    Equipment Utilized During Treatment Gait belt    Activity Tolerance Patient tolerated treatment well    Behavior During Therapy [facility] for tasks assessed/performed             Past Medical History:  Diagnosis Date   Asthma    CAD (coronary artery disease) 11/2003    RCA 60% stenosis, LAD diffuse   Chicken pox    CVA (cerebrovascular accident) (HCC) 2001    right frontoparietal cortical CVA - MRI September 2001   Diabetes mellitus    Diastolic dysfunction     ejection fraction 55-65% on 2-D echo May 2006   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 Leone Payor   Granulomatous disease, chronic (HCC)     in right upper lobe per x-ray December 2005   Hyperlipidemia    Hypertension    NAFLD (nonalcoholic fatty liver disease)     based on ultrasound June 2002, elevated transaminase   OA (osteoarthritis)     DJP coracoclavicular ligament, left patella osteophytes June 2002, L4-S1 spondylosis, degenerative disc disease with bulge. I in October 2001, the C2-C4 spondylosis without stenosis and with spurs based on x-ray July 2005; diffuse idiopathic skeletal hyperostosis with bilateral hip lumbar degenerative changes   on x-ray February 2005, bilateral plantar calcaneal spurs   Reactive airway disease     peak flow 180 in May  2005, chronic sinusitis with PND, bronchitis December 2002 and strep pneumonia April 2007,  FEC 66, MCV 60, ratio is 69, DLCO 57,  moderate restrictive and mild obstructive disease  on spirometry May 2005   Past Surgical History:  Procedure Laterality Date   CARPAL TUNNEL RELEASE  1992    left arm 1992   CHOLECYSTECTOMY  2003   TONSILLECTOMY AND ADENOIDECTOMY     Patient Active Problem List   Diagnosis Date Noted   Bilateral low back pain without sciatica 03/23/2023   Asthma 11/04/2022   B12 deficiency 11/03/2022   Vaginal candidiasis 05/06/2022   Statin myopathy 01/15/2022   Aortic atherosclerosis (HCC) 11/05/2020   Arthropathy of lumbar facet joint 02/27/2020   Allergic rhinitis 11/22/2018   External hemorrhoids 10/07/2018   Chronic constipation 10/07/2018   Morbid obesity (HCC) 04/01/2017   PAD (peripheral artery disease) (HCC) 10/29/2016   Chronic back pain 09/23/2016   Diabetic peripheral neuropathy (HCC) 02/05/2016   Dysuria 01/14/2016   Diabetes (HCC) 06/17/2006   Hyperlipidemia 06/17/2006   Essential hypertension 06/17/2006   Coronary atherosclerosis 06/17/2006   DIASTOLIC DYSFUNCTION 06/17/2006   DISEASE, CEREBROVASCULAR NEC 06/17/2006   Osteoarthritis of both knees 06/17/2006    PCP: Blaine Hamper, MD  REFERRING PROVIDER: Dr. Cheryll Cockayne  REFERRING DIAG: M54.50 (ICD-10-CM) - Bilateral low back pain without sciatica, unspecified chronicity  Rationale for Evaluation and Treatment: Rehabilitation  THERAPY DIAG:  Other low back pain  ONSET DATE: 03/23/2023  SUBJECTIVE:                                                                                                                                                                                           SUBJECTIVE STATEMENT: Pt reports back has been hurting since Monday. Currently pain is 10/10.  PERTINENT HISTORY:  DM  PAIN:  Are you having pain? Yes: NPRS scale: 8/10 Pain location: lower back Pain  description: throbbing, achy, sharp Aggravating factors: standing, walking Relieving factors: lying  PRECAUTIONS: None  RED FLAGS: None   WEIGHT BEARING RESTRICTIONS: No  FALLS:  Has patient fallen in last 6 months? No  LIVING ENVIRONMENT: Lives with:  friend Lives in: House/apartment Stairs: Yes: External: 3 steps; on right going up, on left going up, and can reach both Has following equipment at home: Single point cane and Walker - 4 wheeled  OCCUPATION: retired  PLOF: Requires assistive device for independence, Needs assistance with ADLs, and Needs assistance with homemaking  PATIENT GOALS: to be able to walk longer and to ease the pain    OBJECTIVE:  Note: Objective measures were completed at Evaluation unless otherwise noted.  DIAGNOSTIC FINDINGS:  2021 X ray of lumbar spine: FINDINGS:  Normal alignment. Negative for fracture or mass. Mild disc  degeneration throughout the lumbar spine with mild disc space  narrowing and mild anterior and lateral spurring. Moderate disc  degeneration L5-S1. Negative for pars defect   Calcified uterine fibroids.   Degenerative change in both hips with mild joint space narrowing and  spurring. Spurring of the anterior iliac spine by bilaterally. SI  joints negative   IMPRESSION:  Mild lumbar spine degenerative change.   Mild degenerative change in both hip joints. Degenerative  calcification of the anteriorly ax spine bilaterally in the greater  trochanter bilaterally   Calcified uterine fibroids.   Atherosclerotic aorta without aneurysm.    COGNITION: Overall cognitive status: Within functional limits for tasks assessed    LUMBAR ROM:   AROM eval  Flexion   Extension   Right lateral flexion   Left lateral flexion   Right rotation   Left rotation    (Blank rows = not tested)  LOWER EXTREMITY ROM:     Active  Right eval Left eval  Hip flexion    Hip extension    Hip abduction    Hip adduction    Hip  internal rotation    Hip external rotation    Knee flexion    Knee extension  Ankle dorsiflexion    Ankle plantarflexion    Ankle inversion    Ankle eversion     (Blank rows = not tested)  LOWER EXTREMITY MMT:    MMT Right eval Left eval  Hip flexion    Hip extension    Hip abduction    Hip adduction    Hip internal rotation    Hip external rotation    Knee flexion    Knee extension    Ankle dorsiflexion    Ankle plantarflexion    Ankle inversion    Ankle eversion     (Blank rows = not tested)  FUNCTIONAL TESTS:  10 meter walk test: 0.43 m/s   Attempted to do 2 min walk test but pt unable to perform. Pt able to walk 115' in 1 min 5 sec. Pt had 4-5/10 pain before the start of walk and after walking 115', she reported pain to be 8/10 at end of the session. GAIT: Distance walked: 115' Assistive device utilized: Environmental consultant - 4 wheeled Level of assistance: SBA   TODAY'S TREATMENT:                                                                                                                              DATE:  SciFit: level 1 for 10' with HP to lumbar spine Soft tissue massage to bil lumbar paraspinalis for 10' SL clamshells: 2 x 10 R and L SL hip abduction: 2 x 10 R and L Supine lower trunk rotations: 10x Gait training: 2 x 115' with black resisted cord, st. Cane.    PATIENT EDUCATION:  Education details: see above Person educated: Patient Education method: Explanation Education comprehension: verbalized understanding  HOME EXERCISE PROGRAM: Access Code: [email or web address] URL: [email or web address] Date: 04/15/2023 Prepared by: Lavone Nian  Exercises - Supine Lower Trunk Rotation  - 1-3 x daily - 7 x weekly - 2 sets - 10 reps  ASSESSMENT:  CLINICAL IMPRESSION: Today's session focused on improving soft tissue mobility for pain management in lumbar spine and strengthening of hip and flexibility of lumbar spine for pain management. End of the session  focused on improving gait speed. Pt reported pain to be 4-5/10 at end of the session. Patient has met her STG. Demo improvement in gait speed and met LTG#2 today. Patient will benefit from continued skilled PT  OBJECTIVE IMPAIRMENTS: Abnormal gait, decreased balance, decreased endurance, decreased mobility, decreased ROM, decreased strength, hypomobility, increased muscle spasms, impaired flexibility, postural dysfunction, and pain.   ACTIVITY LIMITATIONS: carrying, lifting, bending, standing, stairs, transfers, bathing, toileting, dressing, and hygiene/grooming  PARTICIPATION LIMITATIONS: meal prep, cleaning, laundry, shopping, and community activity  PERSONAL FACTORS: Age and Time since onset of injury/illness/exacerbation are also affecting patient's functional outcome.   REHAB POTENTIAL: Fair chronic condition  CLINICAL DECISION MAKING: Stable/uncomplicated  EVALUATION COMPLEXITY: Low   GOALS: Goals reviewed with patient? Yes  SHORT TERM GOALS: Target date: 05/13/23  Pt will demo 50 % compliance with HEP  to self manage her symptoms Baseline:Issued on 04/15/23 Goal status: Goal met 05/27/23   LONG TERM GOALS: Target date: 06/08/23  Pt will report 100% compliance with HEP to self manage her symptoms. Baseline:  Goal status: INITIAL  2.  Pt will demo gait speed of >0.55 m/s to decrease fall risk and improve functional ambulation Baseline: 0.43 m/s 04/15/23; 0.62 m/s (05/27/23) Goal status: Goal met  3.  Pt will be able to ambulate for 5' with cane to improve functional amulation. Baseline: only able to ambulate 76' with st. Cane and had to stop due to back pain (04/15/23) Goal status: INITIAL   PLAN:  PT FREQUENCY: 2x/week  PT DURATION: 6 weeks  PLANNED INTERVENTIONS: [id number]- PT Re-evaluation, 97110-Therapeutic exercises, 97530- Therapeutic activity, 97112- Neuromuscular re-education, 97535- Self Care, [id number]- Manual therapy, [id number]- Gait training, Patient/Family education,  Balance training, Stair training, Joint mobilization, Spinal mobilization, Cryotherapy, and Moist heat.  PLAN FOR NEXT SESSION:    Ileana Ladd, PT 05/27/2023, 3:29 PM

## 2023-06-03 ENCOUNTER — Other Ambulatory Visit: Payer: Self-pay | Admitting: Internal Medicine

## 2023-06-06 ENCOUNTER — Other Ambulatory Visit: Payer: Self-pay | Admitting: Internal Medicine

## 2023-06-07 DIAGNOSIS — G894 Chronic pain syndrome: Secondary | ICD-10-CM | POA: Diagnosis not present

## 2023-06-07 DIAGNOSIS — Z79891 Long term (current) use of opiate analgesic: Secondary | ICD-10-CM | POA: Diagnosis not present

## 2023-06-08 ENCOUNTER — Ambulatory Visit: Payer: 59

## 2023-06-10 ENCOUNTER — Ambulatory Visit: Payer: 59

## 2023-06-15 ENCOUNTER — Ambulatory Visit: Payer: 59

## 2023-06-17 ENCOUNTER — Ambulatory Visit: Payer: 59

## 2023-06-22 ENCOUNTER — Ambulatory Visit: Payer: 59 | Admitting: Podiatry

## 2023-07-01 ENCOUNTER — Other Ambulatory Visit: Payer: Self-pay | Admitting: Internal Medicine

## 2023-07-05 DIAGNOSIS — G894 Chronic pain syndrome: Secondary | ICD-10-CM | POA: Diagnosis not present

## 2023-07-05 DIAGNOSIS — Z79891 Long term (current) use of opiate analgesic: Secondary | ICD-10-CM | POA: Diagnosis not present

## 2023-07-06 ENCOUNTER — Ambulatory Visit: Payer: 59 | Attending: Internal Medicine | Admitting: Physical Therapy

## 2023-07-06 DIAGNOSIS — M5459 Other low back pain: Secondary | ICD-10-CM | POA: Insufficient documentation

## 2023-07-06 DIAGNOSIS — R262 Difficulty in walking, not elsewhere classified: Secondary | ICD-10-CM | POA: Insufficient documentation

## 2023-07-06 NOTE — Therapy (Unsigned)
OUTPATIENT PHYSICAL THERAPY WHEELCHAIR EVALUATION   Patient Name: Lindsay Hardy MRN: [id number] DOB:08/07/1950, 73 y.o., female Today's Date: 07/07/2023  END OF SESSION:  PT End of Session - 07/06/23 1535     Visit Number 1    Number of Visits 1    Authorization Type [employer]    PT Start Time 1530    PT Stop Time 1615    PT Time Calculation (min) 45 min    Equipment Utilized During Treatment Gait belt    Activity Tolerance Patient tolerated treatment well    Behavior During Therapy [facility] for tasks assessed/performed             Past Medical History:  Diagnosis Date   Asthma    CAD (coronary artery disease) 11/2003    RCA 60% stenosis, LAD diffuse   Chicken pox    CVA (cerebrovascular accident) (HCC) 2001    right frontoparietal cortical CVA - MRI September 2001   Diabetes mellitus    Diastolic dysfunction     ejection fraction 55-65% on 2-D echo May 2006   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 Leone Payor   Granulomatous disease, chronic (HCC)     in right upper lobe per x-ray December 2005   Hyperlipidemia    Hypertension    NAFLD (nonalcoholic fatty liver disease)     based on ultrasound June 2002, elevated transaminase   OA (osteoarthritis)     DJP coracoclavicular ligament, left patella osteophytes June 2002, L4-S1 spondylosis, degenerative disc disease with bulge. I in October 2001, the C2-C4 spondylosis without stenosis and with spurs based on x-ray July 2005; diffuse idiopathic skeletal hyperostosis with bilateral hip lumbar degenerative changes   on x-ray February 2005, bilateral plantar calcaneal spurs   Reactive airway disease     peak flow 180 in May 2005, chronic  sinusitis with PND, bronchitis December 2002 and strep pneumonia April 2007,  FEC 66, MCV 60, ratio is 69, DLCO 57,  moderate restrictive and mild obstructive disease  on spirometry May 2005   Past Surgical History:  Procedure Laterality Date   CARPAL TUNNEL RELEASE  1992    left arm 1992   CHOLECYSTECTOMY  2003   TONSILLECTOMY AND ADENOIDECTOMY     Patient Active Problem List   Diagnosis Date Noted   Bilateral low back pain without sciatica 03/23/2023   Asthma 11/04/2022   B12 deficiency 11/03/2022   Vaginal candidiasis 05/06/2022   Statin myopathy 01/15/2022   Aortic atherosclerosis (HCC) 11/05/2020   Arthropathy of lumbar facet joint 02/27/2020   Allergic rhinitis 11/22/2018   External hemorrhoids 10/07/2018   Chronic constipation 10/07/2018   Morbid obesity (HCC) 04/01/2017   PAD (peripheral artery disease) (HCC) 10/29/2016   Chronic back pain 09/23/2016   Diabetic peripheral neuropathy (HCC) 02/05/2016   Dysuria 01/14/2016   Diabetes (HCC) 06/17/2006   Hyperlipidemia 06/17/2006   Essential hypertension 06/17/2006   Coronary atherosclerosis 06/17/2006   DIASTOLIC DYSFUNCTION 06/17/2006   DISEASE, CEREBROVASCULAR NEC 06/17/2006   Osteoarthritis of both knees 06/17/2006    PCP: Pincus Sanes, MD  REFERRING PROVIDER: Pincus Sanes, MD  THERAPY DIAG:  Difficulty in walking, not elsewhere classified  Other low back pain  Rationale for Evaluation and Treatment Rehabilitation  SUBJECTIVE:                                                                                                                                                                                           SUBJECTIVE STATEMENT: Pt presents for wheelchair evaluation. Patient arrives with son Zoe Lan who she uses as handhold assist to walk back into session. Patient reports that she is looking for a motorized or power wheelchair today as she is having a hard time getting around. Patient reports that she  uses a cane sometimes at home but doesn't trust herself with it and doesn't feel like she can push a walker very well due to the extent of her back pain. Patient reports that she is moving to a accessible apartment next month which will accommodate a wheelchair.   PRECAUTIONS: None  RED FLAGS: None   WEIGHT BEARING RESTRICTIONS No   OCCUPATION: Not working   PLOF:   Reports requiring min-maxA for various ADLs and IADLs, has a care aid for a few hours a day  PATIENT GOALS: "Find a way to get around that won't increase my back pain."      MEDICAL HISTORY:  Primary diagnosis onset: 06/08/2023 (referral date)      Medical Diagnosis with ICD-10 code: M17.0 (ICD-10-CM) - Primary osteoarthritis of both knees   [] Progressive disease  Relevant future surgeries:     Height: 5\' 5"   Weight: 267 lbs  Explain recent changes or trends in weight:  Reports no major changes in weight at this time    History:  Past Medical History:  Diagnosis Date   Asthma    CAD (coronary artery disease) 11/2003    RCA 60% stenosis, LAD diffuse   Chicken pox    CVA (cerebrovascular accident) (HCC) 2001    right frontoparietal cortical CVA - MRI September 2001   Diabetes mellitus    Diastolic dysfunction     ejection fraction 55-65% on 2-D echo May 2006   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)     based on MRI September 2001, related to obesity, hypertension, it was determined that patient has not required treatment but will be monitored with TSH, ACTH, cortisol, testosterone, prolactin, growth hormone   GERD (gastroesophageal reflux disease)     EGD June 2004 -  followed by Dr. Leone Payor   Granulomatous disease, chronic (HCC)     in right upper lobe per x-ray December 2005   Hyperlipidemia    Hypertension    NAFLD (nonalcoholic fatty liver disease)     based on ultrasound June 2002, elevated transaminase   OA (osteoarthritis)  DJP coracoclavicular ligament, left patella osteophytes June 2002, L4-S1 spondylosis,  degenerative disc disease with bulge. I in October 2001, the C2-C4 spondylosis without stenosis and with spurs based on x-ray July 2005; diffuse idiopathic skeletal hyperostosis with bilateral hip lumbar degenerative changes   on x-ray February 2005, bilateral plantar calcaneal spurs   Reactive airway disease     peak flow 180 in May 2005, chronic sinusitis with PND, bronchitis December 2002 and strep pneumonia April 2007,  FEC 66, MCV 60, ratio is 69, DLCO 57,  moderate restrictive and mild obstructive disease  on spirometry May 2005       Cardio Status:  Functional Limitations: rapid heart  [] Intact  [x]  Impaired    coronary artery disease and ejection fraction 55-65%  Respiratory Status:  Functional Limitations:   [] Intact  [x] Impaired   [x] SOB [] COPD [] O2 Dependent ______LPM  [] Ventilator Dependent  Resp equip:                                                     Objective Measure(s): SPO2 drops to 94% with ambulation into clinic, reports history of asthma and bronchitis, SPO2 96-94% during session   Orthotics:   [] Amputee:                                                             [] Prosthesis:      HOME ENVIRONMENT:  [] House [] Condo/town home [x] Apartment [] Asst living [] LTCF         [] Own  [] Rent   [x] Lives alone [] Lives with others -                             Hours without assistance: 21 hours a day patient is home alone, has an aid come in about 3 hours a day   [x] Home is accessible to patient   - Current home has stairs but is moving to ADL accessible apartment next month prior to receiving power wheelchair                          Storage of wheelchair:  [x] In home   [] Other Comments:        COMMUNITY :  TRANSPORTATION:  [x] Car [] Van [] Public Transportation [] Adapted w/c Lift []  Ambulance [] Other:                     [] Sits in wheelchair during transport   Where is w/c stored during transport? 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       Other:  COMMUNICATION:  Verbal Communication  [x] WFL [] receptive [] WFL [] expressive [] Understandable  [] Difficult to understand  [] non-communicative  Primary Language:____English__________ 2nd:_____________  Communication provided by:[x] Patient [] Family [] Caregiver [] Translator   [] Uses an augmentative communication device     Manufacturer/Model :  MOBILITY/BALANCE:  Sitting Balance  Standing Balance  Transfers  Ambulation   [x] WFL      [] WFL  [] Independent  []  Independent   [] Uses UE for balance in sitting Comments:  [x] Uses UE/device for stability Comments: handhold assist on son  [x]  Min assist - handhold assist with son []  Ambulates independently with       device:___________________      []  Mod assist  []  Able to ambulate ______ feet        safely/functionally/independently   []  Min assist  []  Min assist  []  Max assist  [x]  Non-functional ambulator         History/High risk of falls (see TUG)   []  Mod assist  []  Mod assist  []  Dependent  []  Unable to ambulate   []  Max  assist  []  Max assist  Transfer method:[] 1 person [] 2 person [] sliding board [] squat pivot [x] stand pivot [] mechanical patient lift  [] other:  []  Unable  []  Unable    Fall History: # of falls in the past 6 months? 4 falls due to pain in legs and back per patient  # of "near" falls in the past 6 months? 10 near falls due to pain in legs and back per patient     CURRENT SEATING / MOBILITY:  Current Mobility Device: [] None [x] Cane/Walker - uses a cane at home and 2WW at home but reports not feeling safe  [] Manual [] Dependent [] Dependent w/ Tilt rScooter  [] Power (type of control):   Manufacturer:  Model:  Serial #:   Size:  Color:  Age:   Purchased by whom:   Current condition of mobility base:     Current seating system:                                                                       Age of seating system:    Describe posture in present seating system:    Is the current mobility meeting medical necessity?:  [] Yes [x] No Describe: Patient reporting 10/10 pain with current mobilty devices, has numerous falls including 4 in the last month and numerous near falls                             Ability to complete Mobility-Related Activities of Daily Living (MRADL's) with Current Mobility Device:   Move room to room  [] Independent  [x] Min - Unable to do so without high falls risk without assistance, reports 4 falls in recent months, very limited by pain[] Mod [] Max assist  []  Comments: Reports needing help from aid with activities reported below, reports greatest deficits due to 10/10 pain and failed with conservative PT interventions trialed previously  Meal prep  [] Independent  [] Min [] Mod [x] Max assist  [] Unable    Feeding  [x] Independent  [] Min [] Mod [] Max assist  [] Unable    Bathing  [] Independent  [] Min [x] Mod [] Max assist  [] Unable    Grooming  [] Independent  [] Min [] Mod [x] Max assist  [] Unable    UE dressing  [] Independent  [x] Min [] Mod [] Max assist  [] Unable    LE dressing  [] Independent   [x] Min [] Mod [] Max assist  [] Unable    Toileting  [] Independent  [] Min [] Mod [x] Max assist  []   Unable    Bowel Mgt: [x]  Continent []  Incontinent []  Accidents []  Diapers []  Colostomy []  Bowel Program:  Bladder Mgt: []  Continent []  Incontinent [x]  Accidents []  Diapers []  Urinal []  Intermittent Cath []  Indwelling Cath []  Supra-pubic Cath     Current Mobility Equipment Trialed/ Ruled Out:    Does not meet mobility needs due to:    Mark all boxes that indicate inability to use the specific equipment listed     Meets needs for safe  independent functional  ambulation  / mobility    Risk of  Falling or History of Falls    Enviromental limitations      Cognition    Safety concerns with  physical  ability    Decreased / limitations endurance  & strength     Decreased / limitations  motor skills  & coordination    Pain    Pace /  Speed    Cardiac and/or  respiratory condition    Contra - indicated by diagnosis   Cane/Crutches  []   [x]   []   []   [x]   [x]   []   [x]   [x]   [x]   []    Walker / Rollator  []  NA   []   [x]   []   []   [x]   [x]   []   [x]   [x]   [x]   []     Manual Wheelchair [id number]:  []  NA  []   []   []   []   [x]   [x]   []   []   [x]   [x]   []    Manual W/C (K0005) with power assist  []  NA  []   []   []   []   [x]   [x]   []   []   [x]   []   []    Scooter  []  NA  []   []   [x]   []   []   []   []   [x]   []   []   []    Power Wheelchair: standard joystick  []  NA  [x]   []   []   []   []   []   []   []   []   []   []    Power Wheelchair: alternative controls  [x]  NA  []   []   []   []   []   []   []   []   []   []   []    Summary:  The least costly alternative for independent functional mobility was found to be:    []  Crutch/Cane  []  Walker []  Manual w/c  []  Manual w/c with power assist   []  Scooter   [x]  Power w/c std joystick   []  Power w/c alternative control        []  Requires dependent care mobility device   Cabin crew for [employer] skills are adequate for safe mobility equipment operation  [x]   Yes []   No  Patient is willing and motivated to use recommended mobility equipment  [x]   Yes []   No       []  Patient is unable to safely operate mobility equipment independently and requires dependent care equipment Comments:           SENSATION and SKIN ISSUES:  Sensation []  Intact  [x]  Impaired []  Absent []  Hyposensate []  Hypersensate  []  Defensiveness  Location(s) of impairment: Reports neuropathy in feet and up to knees   Pressure Relief Method(s):  [x]  Lean side to side to offload (without risk of falling)  []   W/C push up (4+ times/hour for 15+ seconds) []  Stand up (without risk of falling)    []  Other: (Describe): Effective pressure relief  method(s) above can be performed  consistently throughout the day: [x] Yes  []  No If not, Why?:  Skin Integrity Risk:       [x]  Low risk           []  Moderate risk            []  High risk  If high risk, explain:   Skin Issues/Skin Integrity  Current skin Issues  []  Yes [x]  No []  Intact  []   Red area   []   Open area  []  Scar tissue  []  At risk from prolonged sitting  Where: History of Skin Issues  []  Yes [x]  No Where : When: Stage: Hx of skin flap surgeries  []  Yes [x]  No Where:  When:  Pain: [x]  Yes []  No   Pain Location(s): L side low back and both knees  Intensity scale: (0-10) : 10/10 How does pain interfere with mobility and/or MRADLs? - Moves at very slow back, pain to extent that increases patient risk for falls         MAT EVALUATION:  Neuro-Muscular Status: (Tone, Reflexive, Responses, etc.)     [x]   Intact   []  Spasticity:  []  Hypotonicity  []  Fluctuating  []  Muscle Spasms  []  Poor Righting Reactions/Poor Equilibrium Reactions  []  Primal Reflex(s):    Comments:            COMMENTS:    POSTURE:     Comments:  Pelvis Anterior/Posterior:  []  Neutral   [x]  Posterior  []  Anterior  []  Fixed - No movement [x]  Tendency away from neutral [x]  Flexible [x]  Self-correction []  External correction Obliquity (viewed from front)  [x]  WFL []  R Obliquity []  L Obliquity  []  Fixed - No movement []  Tendency away from neutral []  Flexible []  Self-correction []  External correction Rotation  [x]  WFL []  R anterior []  L anterior  []  Fixed - No movement []  Tendency away from neutral []  Flexible []  Self-correction []  External correction Tonal Influence Pelvis:  [x]  Normal []  Flaccid []  Low tone []  Spasticity []  Dystonia []  Pelvis thrust []  Other:    Trunk Anterior/Posterior:  []  WFL [x]  Thoracic kyphosis []  Lumbar lordosis  []  Fixed - No movement []  Tendency away from neutral []  Flexible []  Self-correction []  External correction  [x]  WFL []  Convex to left  []  Convex to  right []  S-curve   []  C-curve []  Multiple curves []  Tendency away from neutral []  Flexible []  Self-correction []  External correction Rotation of shoulders and upper trunk:  [x]  Neutral []  Left-anterior []  Right- anterior []  Fixed- no movement []  Tendency away from neutral []  Flexible []  Self correction []  External correction Tonal influence Trunk:  [x]  Normal []  Flaccid []  Low tone []  Spasticity []  Dystonia []  Other:   Head & Neck  [x]  Functional []  Flexed    []  Extended []  Rotated right  []  Rotated left []  Laterally flexed right []  Laterally flexed left []  Cervical hyperextension   [x]  Good head control []  Adequate head control []  Limited head control []  Absent head control Describe tone/movement of head and neck: WFL      Lower Extremity Measurements: LE ROM:  Functionally ROM WFL with exception of hip flexion limited to ~110 degrees bilaterally; however, when attempt to test in sitting patient very guarded and avoids full ROM due to high reports of pain  LE MMT:  Unable to accurately assess due to patient's such high reports of pain and not being able to fully push into testing due  to pain reports; patient = able to move against gravity and stand with moderate UE use with external support to stabilize   Hip positions:  []  Neutral   [x]  Abducted   []  Adducted  []  Subluxed   []  Dislocated   []  Fixed   []  Tendency away from neutral [x]  Flexible [x]  Self-correction []  External correction   Hip Windswept:[x]  Neutral  []  Right    []  Left  []  Subluxed   []  Dislocated   []  Fixed   []  Tendency away from neutral []  Flexible []  Self-correction []  External correction  LE Tone: [x]  Normal []  Low tone []  Spasticity []  Flaccid []  Dystonia []  Rocks/Extends at hip []  Thrust into knee extension []  Pushes legs downward into footrest  Foot positioning: ROM Concerns: Dorsiflexed: []  Right   []  Left Plantar flexed: []  Right    []  Left Inversion: []  Right     []  Left Eversion: [x]  Right    [x]  Left Flexible bilaterally  LE Edema: WFL   UE Measurements:  UPPER EXTREMITY ROM:   Shoulder flexion and abduction limited to ~100 degrees bilaterally   UPPER EXTREMITY MMT:  Scoring grossly 3+/5 for all UE strength through available ROM though patient avoids pushing fully into PT arm for testing for fear of increasing pain   Shoulder Posture:  Right Tendency towards Left  [x]   Functional [x]    []   Elevation []    []   Depression []    []   Protraction []    []   Retraction []    []   Internal rotation []    []   External rotation []    []   Subluxed []     UE Tone: [x]  Normal []  Flaccid []  Low tone []  Spasticity  []  Dystonia []  Other:   UE Edema: WFL  Wrist/Hand: Handedness: [x]  Right   []  Left   []  NA: Comments:  Right  Left  [x]   WNL [x]    []   Limitations []    []   Contractures []    []   Fisting []    []   Tremors []    []   Weak grasp []    []   Poor dexterity []    []   Hand movement non functional []    []   Paralysis []     Functional Outcome Measures: TUG: 63 seconds with 2WW + SBA, reporting pain 10/10 and HR 110 degrees, SPO2 at 94%     MOBILITY BASE RECOMMENDATIONS and JUSTIFICATION:  MOBILITY BASE  JUSTIFICATION   Manufacturer:  Scientist, research (physical sciences):  [employer]                            Color:  Seat Width:  20" Seat Depth 18"    []  Manual mobility base (continue below)   []  Scooter/POV  [x]  Power mobility base   Number of hours per day spent in above selected mobility base: 5-6 hours a day  Typical daily mobility base use Schedule: uses as primary mode of mobility room to room in home to decrease falls risk, will also use for community mobility    [x]  is not a safe, functional ambulator  [x]  limitation prevents from completing a MRADL(s) within a reasonable time frame    [x]  limitation places at high risk of falls and exacerbating pain []  limitation prevents accomplishing a MRADL(s) entirely  [x]  provide independent mobility   [x]  equipment is a lifetime medical need  [x]  walker or cane inadequate  [x]  any type manual wheelchair  inadequate  [x]  scooter/POV inadequate      []  requires dependent mobility         POWER MOBILITY      []  Scooter/POV    []  can safely operate   []  can safely transfer   []  has adequate trunk stability   []  cannot functionally propel  manual wheelchair    [x]  Power mobility base    [x]  non-ambulatory without being a high falls risk   [x]  cannot functionally propel manual wheelchair   [x]  cannot functionally and safely      operate scooter/POV  [x]  can safely operate power       wheelchair  [x]  home is accessible  [x]  willing to use power wheelchair     Tilt  []  Powered tilt on powered chair  []  Powered tilt on manual chair  []  Manual tilt on manual chair Comments:  []  change position for pressure      []  elief/cannot weight shift   []  change position against      gravitational force on head and      shoulders   []  decrease pain  []  blood pressure management   []  control autonomic dysreflexia  []  decrease respiratory distress  []  management of spasticity  []  management of low tone  []  facilitate postural control   []  rest periods   []  control edema  []  increase sitting tolerance   []  aid with transfers     Recline   []  Power recline on power chair  []  Manual recline on manual chair  Comments:    []  intermittent catheterization  []  manage spasticity  []  accommodate femur to back angle  []  change position for pressure relief/cannot weight shift rhigh risk of pressure sore development  []  tilt alone does not accomplish     effective pressure relief, maximum pressure relief achieved at -      _______ degrees tilt   _______ degrees recline   []  difficult to transfer to and from bed []  rest periods and sleeping in chair  []  repositioning for transfers  []  bring to full recline for ADL care  []  clothing/diaper changes in chair  []  gravity PEG tube feeding   []  head positioning  []  decrease pain  []  blood pressure management   []  control autonomic dysreflexia  []  decrease respiratory distress  []  user on ventilator     Elevator on mobility base  []  Power wheelchair  []  Scooter  []  increase Indep in transfers   []  increase Indep in ADLs    []  bathroom function and safety  []  kitchen/cooking function and safety  []  shopping  []  raise height for communication at standing level  []  raise height for eye contact which reduces cervical neck strain and pain  []  drive at raised height for safety and navigating crowds  []  Other:   []  Vertical position system  (anterior tilt)     (Drive locks-out)    []  Stand       (Drive enabled)  []  independent weight bearing  []  decrease joint contractures  []  decrease/manage spasticity  []  decrease/manage spasms  []  pressure distribution away from   scapula, sacrum, coccyx, and ischial tuberosity  []  increase digestion and elimination   []  access to counters and cabinets  []  increase reach  []  increase interaction with others at eye level, reduces neck strain  []  increase performance of       MRADL(s)      Power elevating legrest    []   Center mount (Single) 85-170 degrees       []  Standard (Pair) 100-170 degrees  []  position legs at 90 degrees, not available with std power ELR  []  center mount tucks into chair to decrease turning radius in home, not available with std power ELR  []  provide change in position for LE  []  elevate legs during recline    []  maintain placement of feet on      footplate  []  decrease edema  []  improve circulation  []  actuator needed to elevate legrest  []  actuator needed to articulate legrest preventing knees from flexing  []  Increase ground clearance over      curbs  []   STD (pair) independently                     elevate legrest   POWER WHEELCHAIR CONTROLS      Controls/input device  []  Expandable  [x]  Non-expandable  [x]  Proportional  [x]  Right Hand []  Left Hand   []  Non-proportional/switches/head-array  []  Electrical/proximity         []   Mechanical      Manufacturer:___________________   Type:____Joystick____________________ [x]  provides access for controlling wheelchair  [x]  programming for accurate control  []  progressive disease/changing condition  []  required for alternative drive      controls       []  lacks motor control to operate  proportional drive control  []  unable to understand proportional controls  []  limited movement/strength  []  extraneous movement / tremors / ataxic / spastic       []  Upgraded electronics controller/harness    []  Single power (tilt or recline)   []  Expandable    []  Non-expandable plus   []  Multi-power (tilt, recline, power legrest, power seat lift, vertical positioning system, stand)  []  allows input device to communicate with drive motors  []  harness provides necessary connections between the controller, input device, and seat functions     []  needed in order to operate power seat functions through joystick/ input device  []  required for alternative drive controls     []  Enhanced display  []  required to connect all alternative drive controls   []  required for upgraded joystick      (lite-throw, heavy duty, micro)  []  Allows user to see in which mode and drive the wheelchair is set; necessary for alternate controls       []  Upgraded tracking electronics  []  correct tracking when on uneven surfaces makes switch driving more efficient and less fatiguing  []  increase safety when driving  []  increase ability to traverse thresholds    []  Safety / reset / mode switches     Type:    []  Used to change modes and stop the wheelchair when driving     [x]  Mount for joystick / input device/switches  []  swing away for access or transfers   []  attaches joystick / input device / switches to wheelchair   []  provides for consistent access  []  midline for optimal placement    []  Attendant controlled joystick plus      mount  []  safety  []  long distance driving  []  operation of seat functions  []  compliance with transportation regulations    [x]  Battery  [x]  required to power (power assist / scooter/ power wc / other):   []  Power inverter (24V to 12V)  []  required for ventilator / respiratory equipment / other:     CHAIR OPTIONS MANUAL & POWER  Armrests   [x]  adjustable height []  removable  []  swing away []  fixed  [x]  flip back  []  reclining  [x]  full length pads []  desk []  tube arms []  gel pads  [x]  provide support with elbow at 90    [x]  remove/flip back/swing away for  transfers  [x]  provide support and positioning of upper body    [x]  allow to come closer to table top  []  remove for access to tables  []  provide support for w/c tray  [x]  change of height/angles for variable activities   []  Elbow support / Elbow stop  []  keep elbow positioned on arm pad  []  keep arms from falling off arm pad  during tilt and/or recline   Upper Extremity Support  []  Arm trough  []   R  []   L  Style:  []  swivel mount []  fixed mount   []  posterior hand support  []   tray  []  full tray  []  joystick cut out  []   R  []   L  Style:  []  decrease gravitational pull on      shoulders  []  provide support to increase UE  function  []  provide hand support in natural    position  []  position flaccid UE  []  decrease subluxation    []  decrease edema       []  manage spasticity   []  provide midline positioning  []  provide work surface  []  placement for AAC/ Computer/ EADL       Hangers/ Legrests   []  ______ degree  []  Elevating []  articulating  []  swing away []  fixed []  lift off  []  heavy duty  []  adjustable knee angle  []  adjustable calf panel   []  longer extension tube              []  provide LE support  []  maintain placement of feet on      footplate   []  accommodate lower leg length  []  accommodate to hamstring       tightness  []  enable transfers  []  provide change in position for LE's  []  elevate legs  during recline    []  decrease edema  []  durability      Foot support   [x]  footplate []  R []  L [x]  flip up           [x]  Depth adjustable   [x]  angle adjustable  []  foot board/one piece    [x]  provide foot support  [x]  accommodate to ankle ROM  [x]  allow foot to go under wheelchair base  [x]  enable transfers     []  Shoe holders  []  position foot    []  decrease / manage spasticity  []  control position of LE  []  stability    []  safety     []  Ankle strap/heel      loops  []  support foot on foot support  []  decrease extraneous movement  []  provide input to heel   []  protect foot     []  Amputee adapter []  R  []  L     Style:                  Size:  []  Provide support for stump/residual extremity    []  Transportation tie-down  []  to provide crash tested tie-down brackets    []  Crutch/cane holder    []  O2 holder    []  IV hanger   []  Ventilator tray/mount    []  stabilize accessory on wheelchair  Component  Justification     [x]  Seat cushion - Captain seat     []  accommodate impaired sensation  []  decubitus ulcers present or history  []  unable to shift weight  []  increase pressure distribution  []  prevent pelvic extension  []  custom required "off-the-shelf"    seat cushion will not accommodate deformity  [x]  stabilize/promote pelvis alignment and decrease pain []  stabilize/promote femur alignment  []  accommodate obliquity  []  accommodate multiple deformity  []  incontinent/accidents  []  low maintenance     []  seat mounts                 []  fixed []  removable  []  attach seat platform/cushion to wheelchair frame    []  Seat wedge    []  provide increased aggressiveness of seat shape to decrease sliding  down in the seat  []  accommodate ROM        []  Cover replacement   []  protect back or seat cushion  []  incontinent/accidents    []  Solid seat / insert    []  support cushion to prevent      hammocking  []  allows attachment of cushion to mobility base    []  Lateral  pelvic/thigh/hip     support (Guides)     []  decrease abduction  []  accommodate pelvis  []  position upper legs  []  accommodate spasticity  []  removable for transfers     []  Lateral pelvic/thigh      supports mounts  []  fixed   []  swing-away   []  removable  []  mounts lateral pelvic/thigh supports     []  mounts lateral pelvic/thigh supports swing-away or removable for transfers    []  Medial thigh support ([place]mel)  [] decrease adduction  [] accommodate ROM  []  remove for transfers   []  alignment      []  Medial thigh   []  fixed      support mounts      []  swing-away   []  removable  []  mounts medial thigh supports   []  Mounts medial supports swing- away or removable for transfers       Component  Justification   []  Back       []  provide posterior trunk support []  facilitate tone  []  provide lumbar/sacral support []  accommodate deformity  []  support trunk in midline   []  custom required "off-the-shelf" back support will not accommodate deformity   []  provide lateral trunk support []  accommodate or decrease tone            []  Back mounts  []  fixed  []  removable  []  attach back rest/cushion to wheelchair frame   []  Lateral trunk      supports  []  R []  L  []  decrease lateral trunk leaning  []  accommodate asymmetry    []  contour for increased contact  []  safety    []  control of tone    []  Lateral trunk      supports mounts  []  fixed  []  swing-away   []  removable  []  mounts lateral trunk supports     []  Mounts lateral trunk supports swing-away or removable for transfers   []  Anterior chest      strap, vest     []  decrease forward movement of shoulder  []  decrease forward movement of trunk  []  safety/stability  []  added abdominal support  []  trunk alignment  []  assistance with shoulder control   []  decrease shoulder elevation    []  Headrest      []  provide  posterior head support  []  provide posterior neck support  []  provide lateral head support  []  provide anterior head support   []  support during tilt and recline  []  improve feeding     []  improve respiration  []  placement of switches  []  safety    []  accommodate ROM   []  accommodate tone  []  improve visual orientation   []  Headrest           []  fixed []  removable []  flip down      Mounting hardware   []  swing-away laterals/switches  []  mount headrest   []  mounts headrest flip down or  removable for transfers  []  mount headrest swing-away laterals   []  mount switches     []  Neck Support    []  decrease neck rotation  []  decrease forward neck flexion   Pelvic Positioner    []  std hip belt          []  padded hip belt  []  dual pull hip belt  []  four point hip belt  []  stabilize tone  []  decrease falling out of chair  []  prevent excessive extension  []  special pull angle to control      rotation  []  pad for protection over boney   prominence  []  promote comfort    []  Essential needs        bag/pouch   []  medicines []  special food rorthotics []  clothing changes  []  diapers  []  catheter/hygiene []  ostomy supplies   The above equipment has a life- long use expectancy.  Growth and changes in medical and/or functional conditions would be the exceptions.   SUMMARY:  Why mobility device was selected; include why a lower level device is not appropriate:   ASSESSMENT:  CLINICAL IMPRESSION: Patient is a 73 y.o. female who was seen today for physical therapy evaluation for a seating and mobility device. Patient with a PMH of back pain, osteoarthritis, asthma, and chronic CVA. Patient has had over 4 falls and 10 near fall in last 6 months with current mobility devices including walker and cane. Patient is also a high falls risk as indicated by TUG of 63 seconds with 2WW, making cane, walker, and crutch not appropriate at this time. Patient not appropriate candidate for manual wheelchair given lower SPO2 readings and elevated HR readings as well as decreased shoulder ROM. Patient also not an appropriate candidate for  scooter as patient is planning to use mobilty device in home and scooter turn radius not appropriate in this setting. For these reasons, physical therapist is recommending the [facility] Go Chair med as a lifetime medical necessity as it meets patient current mobility needs, decreases falls, is appropriate for in home [place], and breaks down so patient can have carried in her vehicle with help of family member or care aid for community use.   OBJECTIVE IMPAIRMENTS Abnormal gait, decreased balance, difficulty walking, decreased ROM, decreased strength, and pain.   ACTIVITY LIMITATIONS carrying, bending, transfers, and locomotion level  PARTICIPATION LIMITATIONS: meal prep, cleaning, and community activity  PERSONAL FACTORS Age, Time since onset of injury/illness/exacerbation, and 3+ comorbidities: see above  are also affecting patient's functional outcome.   REHAB POTENTIAL: Good  CLINICAL DECISION MAKING: Stable/uncomplicated  EVALUATION COMPLEXITY: Low                                   GOALS: One time visit. No goals established.  PLAN: PT FREQUENCY: one time visit   Carmelia Bake, PT, DPT 07/07/2023, 9:08 AM    I concur with the above findings and recommendations of the therapist:  Physician name printed:         Physician's signature:      Date:

## 2023-07-07 ENCOUNTER — Other Ambulatory Visit: Payer: Self-pay | Admitting: Internal Medicine

## 2023-07-07 ENCOUNTER — Encounter: Payer: Self-pay | Admitting: Physical Therapy

## 2023-07-07 MED ORDER — LANCET DEVICE MISC: 0 refills | Status: DC

## 2023-07-13 ENCOUNTER — Ambulatory Visit: Payer: 59 | Admitting: Internal Medicine

## 2023-07-18 ENCOUNTER — Encounter: Payer: Self-pay | Admitting: Internal Medicine

## 2023-07-18 NOTE — Progress Notes (Signed)
 Subjective:    Patient ID: Lindsay Hardy, female    DOB: [birth year], 73 y.o.   MRN: [id number]     HPI Ma is here for follow up of her chronic medical problems.  1-2 days ago she had diarrhea - had 3-4 episodes a day.  She is having abdominal pain. It is a little better today.  She has not had any diarrhea today.  She still has some abdominal cramping - across the lower abdomen.  The pain is better.  She had a friend with similar symptoms that had the GI virus.    Medications and allergies reviewed with patient and updated if appropriate.  Current Outpatient Medications on File Prior to Visit  Medication Sig Dispense Refill   Accu-Chek Softclix Lancets lancets USE UP TO FOUR TIMES DAILY AS DIRECTED 100 each 1   albuterol  (VENTOLIN  HFA) 108 (90 Base) MCG/ACT inhaler INHALE 2 PUFFS INTO THE LUNGS EVERY 4 (FOUR) HOURS AS NEEDED FOR SHORTNESS OF BREATH. 6.7 each 11   amLODipine  (NORVASC ) 10 MG tablet Take 1 tablet (10 mg total) by mouth daily. 90 tablet 1   aspirin  81 MG EC tablet TAKE 1 TABLET (81 MG TOTAL) BY MOUTH DAILY. 90 tablet 1   Blood Glucose Monitoring Suppl DEVI UAD to check sugars daily and as needed.  May substitute to any manufacturer covered by patient's insurance. E11.65 1 each 0   fluticasone  (FLONASE ) 50 MCG/ACT nasal spray Place 2 sprays into both nostrils daily. 16 g 6   glipiZIDE  (GLUCOTROL ) 10 MG tablet TAKE 2 TABLETS BY MOUTH 2 TIMES DAILY BEFORE A MEAL. 360 tablet 3   Glucose Blood (BLOOD GLUCOSE TEST STRIPS) STRP UAD to check sugars daily and as needed.  May substitute to any manufacturer covered by patient's insurance. E11.65 100 strip 3   HYDROcodone -acetaminophen  (NORCO) 10-325 MG tablet SMARTSIG:1 Tablet(s) By Mouth 4-6 Times Daily     ibuprofen  (ADVIL ) 800 MG tablet TAKE 1 TABLET BY MOUTH 2 TIMES DAILY AS NEEDED FOR MODERATE PAIN. 20 tablet 0   Lancet Device MISC UAD to check sugars daily and as needed.  May substitute to any manufacturer covered by  patient's insurance. E11.65 1 each 0   Lancets Misc. MISC UAD to check sugars daily and as needed.  May substitute to any manufacturer covered by patient's insurance. E11.65 100 each 3   linaclotide  (LINZESS ) 145 MCG CAPS capsule TAKE 1 CAPSULE BY MOUTH DAILY BEFORE BREAKFAST. 90 capsule 1   metFORMIN  (GLUCOPHAGE -XR) 500 MG 24 hr tablet TAKE 2 TABLETS BY MOUTH 2 TIMES DAILY BEFORE A MEAL. 360 tablet 1   Misc. Devices (BARIATRIC ROLLATOR) MISC Use as directed.  Dx  M54.9 1 each 0   montelukast  (SINGULAIR ) 10 MG tablet TAKE 1 TABLET BY MOUTH EVERYDAY AT BEDTIME 90 tablet 1   MOUNJARO  5 MG/0.5ML Pen ADMINISTER 5 MG UNDER THE SKIN 1 TIME A WEEK 2 mL 0   naloxone  (NARCAN ) nasal spray 4 mg/0.1 mL 1 spray in one nostril x1; may repeat dose in alternate nostrils q2-3min prn 1 each 1   potassium chloride  (MICRO-K ) 10 MEQ CR capsule TAKE 1 CAPSULE BY MOUTH TWICE A DAY 180 capsule 1   RESTASIS 0.05 % ophthalmic emulsion      No current facility-administered medications on file prior to visit.     Review of Systems  Constitutional:  Positive for appetite change (decreased appetite). Negative for fever.  HENT:  Negative for congestion.   Respiratory:  Positive  for shortness of breath (chronic). Negative for cough (resolved - had it last week) and wheezing.   Cardiovascular:  Negative for chest pain, palpitations and leg swelling.  Gastrointestinal:  Positive for abdominal pain, diarrhea and nausea. Negative for blood in stool (no melena) and vomiting.       No gerd  Neurological:  Positive for light-headedness. Negative for headaches.       Objective:   Vitals:   07/19/23 1500  BP: 134/84  Pulse: 85  Temp: 98 F (36.7 C)  SpO2: 98%   BP Readings from Last 3 Encounters:  07/19/23 134/84  05/04/23 139/79  03/23/23 (!) 144/82   Wt Readings from Last 3 Encounters:  07/19/23 262 lb (118.8 kg)  03/12/23 267 lb (121.1 kg)  11/18/22 266 lb (120.7 kg)   Body mass index is 43.6 kg/m.     Physical Exam Constitutional:      General: She is not in acute distress.    Appearance: Normal appearance.  HENT:     Head: Normocephalic and atraumatic.  Eyes:     Conjunctiva/sclera: Conjunctivae normal.  Cardiovascular:     Rate and Rhythm: Normal rate and regular rhythm.     Heart sounds: Normal heart sounds.  Pulmonary:     Effort: Pulmonary effort is normal. No respiratory distress.     Breath sounds: Normal breath sounds. No wheezing.  Abdominal:     General: There is no distension.     Palpations: Abdomen is soft.     Tenderness: There is abdominal tenderness (throughout abdomen, mild). There is no guarding or rebound.  Musculoskeletal:     Cervical back: Neck supple.     Right lower leg: No edema.     Left lower leg: No edema.  Lymphadenopathy:     Cervical: No cervical adenopathy.  Skin:    General: Skin is warm and dry.     Findings: No rash.  Neurological:     Mental Status: She is alert. Mental status is at baseline.  Psychiatric:        Mood and Affect: Mood normal.        Behavior: Behavior normal.        Lab Results  Component Value Date   WBC 5.8 11/04/2022   HGB 12.5 11/04/2022   HCT 39.3 11/04/2022   PLT 280.0 11/04/2022   GLUCOSE 163 (H) 11/04/2022   CHOL 149 11/04/2022   TRIG 79.0 11/04/2022   HDL 39.60 11/04/2022   LDLCALC 93 11/04/2022   ALT 17 11/04/2022   AST 16 11/04/2022   NA 137 11/04/2022   K 3.4 (L) 11/04/2022   CL 100 11/04/2022   CREATININE 0.94 11/04/2022   BUN 8 11/04/2022   CO2 29 11/04/2022   TSH 1.84 08/06/2020   HGBA1C 7.2 (A) 03/12/2023   MICROALBUR 4.6 (H) 11/04/2022     Assessment & Plan:    See Problem List for Assessment and Plan of chronic medical problems.

## 2023-07-18 NOTE — Patient Instructions (Addendum)
      Blood work was ordered.       Medications changes include :   None     Return in about 6 months (around 01/16/2024) for follow up.

## 2023-07-19 ENCOUNTER — Ambulatory Visit (INDEPENDENT_AMBULATORY_CARE_PROVIDER_SITE_OTHER): Payer: 59 | Admitting: Internal Medicine

## 2023-07-19 VITALS — BP 134/84 | HR 85 | Temp 98.0°F | Ht 65.0 in | Wt 262.0 lb

## 2023-07-19 DIAGNOSIS — T466X5A Adverse effect of antihyperlipidemic and antiarteriosclerotic drugs, initial encounter: Secondary | ICD-10-CM

## 2023-07-19 DIAGNOSIS — Z7984 Long term (current) use of oral hypoglycemic drugs: Secondary | ICD-10-CM | POA: Diagnosis not present

## 2023-07-19 DIAGNOSIS — E782 Mixed hyperlipidemia: Secondary | ICD-10-CM

## 2023-07-19 DIAGNOSIS — I10 Essential (primary) hypertension: Secondary | ICD-10-CM

## 2023-07-19 DIAGNOSIS — E1142 Type 2 diabetes mellitus with diabetic polyneuropathy: Secondary | ICD-10-CM

## 2023-07-19 DIAGNOSIS — K529 Noninfective gastroenteritis and colitis, unspecified: Secondary | ICD-10-CM | POA: Insufficient documentation

## 2023-07-19 DIAGNOSIS — G720 Drug-induced myopathy: Secondary | ICD-10-CM | POA: Diagnosis not present

## 2023-07-19 LAB — CBC WITH DIFFERENTIAL/PLATELET
Basophils Absolute: 0 10*3/uL (ref 0.0–0.1)
Basophils Relative: 0.8 % (ref 0.0–3.0)
Eosinophils Absolute: 0.1 10*3/uL (ref 0.0–0.7)
Eosinophils Relative: 2.7 % (ref 0.0–5.0)
HCT: 39.9 % (ref 36.0–46.0)
Hemoglobin: 12.7 g/dL (ref 12.0–15.0)
Lymphocytes Relative: 33.9 % (ref 12.0–46.0)
Lymphs Abs: 1.5 10*3/uL (ref 0.7–4.0)
MCHC: 32 g/dL (ref 30.0–36.0)
MCV: 80.3 fL (ref 78.0–100.0)
Monocytes Absolute: 0.5 10*3/uL (ref 0.1–1.0)
Monocytes Relative: 11.1 % (ref 3.0–12.0)
Neutro Abs: 2.3 10*3/uL (ref 1.4–7.7)
Neutrophils Relative %: 51.5 % (ref 43.0–77.0)
Platelets: 285 10*3/uL (ref 150.0–400.0)
RBC: 4.96 Mil/uL (ref 3.87–5.11)
RDW: 15 % (ref 11.5–15.5)
WBC: 4.4 10*3/uL (ref 4.0–10.5)

## 2023-07-19 LAB — VITAMIN B12: Vitamin B-12: 468 pg/mL (ref 211–911)

## 2023-07-19 LAB — COMPREHENSIVE METABOLIC PANEL
ALT: 20 U/L (ref 0–35)
AST: 17 U/L (ref 0–37)
Albumin: 4 g/dL (ref 3.5–5.2)
Alkaline Phosphatase: 74 U/L (ref 39–117)
BUN: 13 mg/dL (ref 6–23)
CO2: 32 meq/L (ref 19–32)
Calcium: 9.5 mg/dL (ref 8.4–10.5)
Chloride: 99 meq/L (ref 96–112)
Creatinine, Ser: 0.82 mg/dL (ref 0.40–1.20)
GFR: 70.61 mL/min (ref >60.00)
Glucose, Bld: 106 mg/dL — ABNORMAL HIGH (ref 70–99)
Potassium: 3.7 meq/L (ref 3.5–5.1)
Sodium: 139 meq/L (ref 135–145)
Total Bilirubin: 0.5 mg/dL (ref 0.2–1.2)
Total Protein: 7.4 g/dL (ref 6.0–8.3)

## 2023-07-19 LAB — LIPID PANEL
Cholesterol: 152 mg/dL (ref 0–200)
HDL: 43.1 mg/dL (ref >39.00)
LDL Cholesterol: 97 mg/dL (ref 0–99)
NonHDL: 108.41
Total CHOL/HDL Ratio: 4
Triglycerides: 58 mg/dL (ref 0.0–149.0)
VLDL: 11.6 mg/dL (ref 0.0–40.0)

## 2023-07-19 LAB — HEMOGLOBIN A1C: Hgb A1c MFr Bld: 7 % — ABNORMAL HIGH (ref 4.6–6.5)

## 2023-07-19 NOTE — Assessment & Plan Note (Signed)
H/o statin myopathy Intolerance of statins - has tried more than one

## 2023-07-19 NOTE — Assessment & Plan Note (Signed)
 Chronic Regular exercise and healthy diet encouraged  Statin intolerant-has tried a few Lab Results  Component Value Date   LDLCALC 97 07/19/2023   She is working on weight loss which will help, sugars getting better controlled Would like to hold off on any cholesterol medication

## 2023-07-19 NOTE — Assessment & Plan Note (Signed)
Chronic Stressed sugar control Not currently on medication 

## 2023-07-19 NOTE — Assessment & Plan Note (Signed)
 Acute Having abdominal pain and diarrhea-today is day 2 Symptoms have improved Symptoms likely related to viral gastroenteritis Symptomatic treatment If her symptoms do not get better she will let me know

## 2023-07-19 NOTE — Assessment & Plan Note (Signed)
Chronic BP controlled Working on weight loss Continue amlodipine 10 mg daily,

## 2023-07-19 NOTE — Assessment & Plan Note (Signed)
 Chronic  Lab Results  Component Value Date   HGBA1C 7.0 (H) 07/19/2023   Sugars improved not ideally controlled Continue glipizide  20 mg twice daily, metformin  XR 1000 mg twice daily.  Mounajro 5 mg weekly-will increase to 5 mg weekly with her next prescription - no increase due to GI side effects Stressed compliance with medication Continue diabetic diet Continue weight loss efforts

## 2023-07-20 ENCOUNTER — Encounter: Payer: Self-pay | Admitting: Internal Medicine

## 2023-07-30 ENCOUNTER — Other Ambulatory Visit: Payer: Self-pay | Admitting: Internal Medicine

## 2023-08-02 ENCOUNTER — Telehealth: Payer: Self-pay | Admitting: Internal Medicine

## 2023-08-02 DIAGNOSIS — G894 Chronic pain syndrome: Secondary | ICD-10-CM | POA: Diagnosis not present

## 2023-08-02 NOTE — Telephone Encounter (Unsigned)
 Copied from CRM [id number]. Topic: General - Other >> Aug 02, 2023  3:43 PM Irine Seal wrote: Reason for CRM: Maralyn Sago- [employer], calling for status update on the request she sent in 07/08/23 and again 07/26/23,  she stated that the patients insurance will not pay for the power wheel chair, unless she has a face to face OV with Dr. Lawerance Bach to discuss why the wheel chair is needed, I informed her that I seen the fax from 06/24/23, she stated that is the medical necessity form and is not what she is calling or needing. Offered to schedule an appt, but Maralyn Sago stated she needs a callback first CB# [phone]

## 2023-08-03 ENCOUNTER — Encounter: Payer: Self-pay | Admitting: Podiatry

## 2023-08-03 ENCOUNTER — Ambulatory Visit (INDEPENDENT_AMBULATORY_CARE_PROVIDER_SITE_OTHER): Payer: 59 | Admitting: Podiatry

## 2023-08-03 DIAGNOSIS — B351 Tinea unguium: Secondary | ICD-10-CM

## 2023-08-03 DIAGNOSIS — E1142 Type 2 diabetes mellitus with diabetic polyneuropathy: Secondary | ICD-10-CM | POA: Diagnosis not present

## 2023-08-03 DIAGNOSIS — M79674 Pain in right toe(s): Secondary | ICD-10-CM

## 2023-08-03 DIAGNOSIS — M79675 Pain in left toe(s): Secondary | ICD-10-CM | POA: Diagnosis not present

## 2023-08-03 NOTE — Progress Notes (Signed)
 This patient returns to my office for at risk foot care.  This patient requires this care by a professional since this patient will be at risk due to having  DPN and PAD.  This patient is unable to cut nails herself since the patient cannot reach hernails.These nails are painful walking and wearing shoes.   She also says she injured the skin at the base fourth toe on bottom Oo toe.This patient presents for at risk foot care today.  General Appearance  Alert, conversant and in no acute stress.  Vascular  Dorsalis pedis and posterior tibial  pulses are palpable  bilaterally.  Capillary return is within normal limits  bilaterally. Temperature is within normal limits  bilaterally.  Neurologic  Senn-Weinstein monofilament wire test within normal limits  bilaterally. Muscle power within normal limits bilaterally.  Nails Thick disfigured discolored nails with subungual debris  from hallux to fifth toes bilaterally. No evidence of bacterial infection or drainage bilaterally.  Orthopedic  No limitations of motion  feet .  No crepitus or effusions noted.  No bony pathology or digital deformities noted.  Skin  normotropic skin with no porokeratosis noted bilaterally.  No signs of infections or ulcers noted.   Skin abrasion on fourth toe right foot on plantar crest.  No infection.  Onychomycosis  Pain in right toes  Pain in left toes  Consent was obtained for treatment procedures.   Mechanical debridement of nails 1-5  bilaterally performed with a nail nipper.  Filed with dremel without incident.    Return office visit    3 months                  Told patient to return for periodic foot care and evaluation due to potential at risk complications.   Helane Gunther DPM

## 2023-08-05 NOTE — Telephone Encounter (Signed)
 Copied from CRM [id number]. Topic: General - Other >> Aug 04, 2023  3:43 PM Theodis Sato wrote: Reason for CRM: Patient is inquiring if Dr. Lawerance Bach can help provide her with documentation/verification that her wheelchair is medically necessary in order for her to be approved for a specific apartment.

## 2023-08-06 NOTE — Telephone Encounter (Signed)
Pt called back to follow up. Please advise.

## 2023-08-09 NOTE — Telephone Encounter (Signed)
 Copied from CRM [id number]. Topic: Clinical - Medical Advice >> Aug 09, 2023 10:22 AM Adelina Mings wrote: Reason for CRM: patient need referral form that patient needs a apt for wheel chair accessibility and that it is medically necessary

## 2023-08-12 NOTE — Telephone Encounter (Signed)
 Copied from CRM [id number]. Topic: General - Other >> Aug 12, 2023 10:03 AM [place] A wrote: Reason for CRM: Patient states that she has been calling since last week regarding a referral for a Handicap Apartment-please contact Patient

## 2023-08-13 ENCOUNTER — Encounter: Payer: Self-pay | Admitting: Internal Medicine

## 2023-08-16 NOTE — Telephone Encounter (Signed)
 Letter received, waiting for pt response where this needs to be sent

## 2023-08-16 NOTE — Telephone Encounter (Signed)
 Form received and placed in provider box up front

## 2023-08-17 NOTE — Telephone Encounter (Signed)
 Patient picked up letter today.

## 2023-08-27 NOTE — Telephone Encounter (Signed)
 Copied from CRM [id number]. Topic: General - Other >> Aug 27, 2023  2:58 PM Elizebeth Brooking wrote: Reason for CRM: [employer] called in stating that they need the face to face chart notes for patient to get her wheelchair, patient has been waiting since January please assist with this fax number is  [phone]

## 2023-08-27 NOTE — Telephone Encounter (Signed)
 Left message for patient and mychart sent.  She will need to be seen again because last visit we did not discuss chair and she has to have been seen within the past 6 months.

## 2023-08-30 DIAGNOSIS — G894 Chronic pain syndrome: Secondary | ICD-10-CM | POA: Diagnosis not present

## 2023-08-30 DIAGNOSIS — Z79891 Long term (current) use of opiate analgesic: Secondary | ICD-10-CM | POA: Diagnosis not present

## 2023-09-02 NOTE — Progress Notes (Unsigned)
 Subjective:    Patient ID: Lindsay Hardy, female    DOB: 1951/01/24, 73 y.o.   MRN: [id number]      HPI Zykera is here for  --    Reason for Visit: Mobility Evaluation    She lives in an apartment.   She has chronic lower back pain and can not stand long periods or walk long distances.  She can use a walker for short distances only but needs an electric wheelchair to enable her to go long distances.       An electric wheelchair will assist the patient in the home with ADLs; including cooking, cleaning and toileting.  This will allow her to ambulate long distances in and out of her home and enable her to go to the grocery store which is near her apartment.  She can use a walker for short distances only but can not stand long periods so a walker will not help with some of her ADL's.  A cane, walker or manual wheel chair will not assist patient efficiently due to living in an apartment, obesity, dyspnea on exertion, upper extremity weakness - shoulder and upper arms, neuropathy resulting in poor balance, chronic back pain and lower extremity pain and weakness. She has the ability to operate the electric wheelchair.  Patient has the mental and physical capacity to follow and operate the electric wheelchair safety instructions. Patient is willing to use in the home as well as outside of the home.    Pain -  Back pain - lower back - 10/10 B/l knee pain - sitting, 8-9/10, stand 9/10, laying down can reach 0/10  Can walk with walker - 8-10 steps and then needs to sit  Poor balance.  Larey Seat last 3/14 - was not using her walker going to the bathroom in the middle of the night.  Has had 4 falls since the beginning of the year.     Medications and allergies reviewed with patient and updated if appropriate.  Current Outpatient Medications on File Prior to Visit  Medication Sig Dispense Refill   Accu-Chek Softclix Lancets lancets USE UP TO FOUR TIMES DAILY AS DIRECTED 100 each 1   albuterol  (VENTOLIN HFA) 108 (90 Base) MCG/ACT inhaler INHALE 2 PUFFS INTO THE LUNGS EVERY 4 (FOUR) HOURS AS NEEDED FOR SHORTNESS OF BREATH. 6.7 each 11   amLODipine (NORVASC) 10 MG tablet Take 1 tablet (10 mg total) by mouth daily. 90 tablet 1   aspirin 81 MG EC tablet TAKE 1 TABLET (81 MG TOTAL) BY MOUTH DAILY. 90 tablet 1   Blood Glucose Monitoring Suppl DEVI UAD to check sugars daily and as needed.  May substitute to any manufacturer covered by patient's insurance. E11.65 1 each 0   fluticasone (FLONASE) 50 MCG/ACT nasal spray Place 2 sprays into both nostrils daily. 16 g 6   glipiZIDE (GLUCOTROL) 10 MG tablet TAKE 2 TABLETS BY MOUTH 2 TIMES DAILY BEFORE A MEAL. 360 tablet 3   Glucose Blood (BLOOD GLUCOSE TEST STRIPS) STRP UAD to check sugars daily and as needed.  May substitute to any manufacturer covered by patient's insurance. E11.65 100 strip 3   HYDROcodone-acetaminophen (NORCO) 10-325 MG tablet SMARTSIG:1 Tablet(s) By Mouth 4-6 Times Daily     ibuprofen (ADVIL) 800 MG tablet TAKE 1 TABLET BY MOUTH 2 TIMES DAILY AS NEEDED FOR MODERATE PAIN. 20 tablet 0   Lancet Device MISC UAD to check sugars daily and as needed.  May substitute to any manufacturer covered by  patient's insurance. E11.65 1 each 0   Lancets Misc. MISC UAD to check sugars daily and as needed.  May substitute to any manufacturer covered by patient's insurance. E11.65 100 each 3   linaclotide (LINZESS) 145 MCG CAPS capsule TAKE 1 CAPSULE BY MOUTH DAILY BEFORE BREAKFAST. 90 capsule 1   metFORMIN (GLUCOPHAGE-XR) 500 MG 24 hr tablet TAKE 2 TABLETS BY MOUTH 2 TIMES DAILY BEFORE A MEAL. 360 tablet 1   Misc. Devices (BARIATRIC ROLLATOR) MISC Use as directed.  Dx  M54.9 1 each 0   montelukast (SINGULAIR) 10 MG tablet TAKE 1 TABLET BY MOUTH EVERYDAY AT BEDTIME 90 tablet 1   MOUNJARO 5 MG/0.5ML Pen ADMINISTER 5 MG UNDER THE SKIN 1 TIME A WEEK 2 mL 0   naloxone (NARCAN) nasal spray 4 mg/0.1 mL 1 spray in one nostril x1; may repeat dose in alternate  nostrils q2-14min prn 1 each 1   potassium chloride (MICRO-K) 10 MEQ CR capsule TAKE 1 CAPSULE BY MOUTH TWICE A DAY 180 capsule 1   RESTASIS 0.05 % ophthalmic emulsion      No current facility-administered medications on file prior to visit.    Review of Systems     Objective:   Vitals:   09/03/23 1516  BP: 114/70  Pulse: 98  Temp: 98.8 F (37.1 C)  SpO2: 97%   BP Readings from Last 3 Encounters:  09/03/23 114/70  07/19/23 134/84  05/04/23 139/79   Wt Readings from Last 3 Encounters:  09/03/23 260 lb 3.2 oz (118 kg)  07/19/23 262 lb (118.8 kg)  03/12/23 267 lb (121.1 kg)   Body mass index is 43.3 kg/m.    Physical Exam    Constitutional:      General: She is not in acute distress.    Appearance: Normal appearance.  HENT:     Head: Normocephalic and atraumatic.  Eyes:     Conjunctiva/sclera: Conjunctivae normal.  Cardiovascular:     Rate and Rhythm: Normal rate and regular rhythm.     Heart sounds: Normal heart sounds.  Pulmonary:     Effort: Pulmonary effort is normal. No respiratory distress.     Breath sounds: Normal breath sounds. No wheezing.  Musculoskeletal:     Cervical back: Neck supple. Slight decrease ROM of c-spine.  Decreased ROM of back with flexion and extension- any movement of back is painful    Right lower leg: No edema.     Left lower leg: No edema.     Comments: Moderate dec ROM of b/l shoulders in all directions, mild dec ROM b/l proximal LEs, normal ROM of knees Lymphadenopathy:     Cervical: No cervical adenopathy.  Skin:    General: Skin is warm and dry.     Findings: No rash.  Neurological:     Mental Status: She is alert. Mental status is at baseline.     Sensory: No sensory deficit.     Motor: Weakness (mild generalized weakness UEs and LEs, 4/5) present.     Comments: Good grip b/l hands  Psychiatric:        Mood and Affect: Mood normal.        Behavior: Behavior normal.      Assessment & Plan:    Chronic lower back  pain, upper extremity weakness, decreased stamina, dyspnea on exertion, lower extremity weakness, diabetic neuropathy: Chronic All of these medical problems limit her significantly-she is not able to stand long periods of time and is not able to walk long  distances She is able to use a cane, rolling walker inside her apartment for short distances, but is still limited because she cannot stand long periods of time even with this which makes doing her ADLs not possible Needs electric wheelchair for inside and outside the apartment for when going longer distances and when she is required to stand long periods.  The electric wheelchair will also help prevent falls.      See Problem List for Assessment and Plan of chronic medical problems.

## 2023-09-02 NOTE — Patient Instructions (Addendum)
      Electric wheelchair ordered    Medications changes include :   None    A referral was ordered and someone will call you to schedule an appointment.     Return for follow up as scheduled.

## 2023-09-03 ENCOUNTER — Ambulatory Visit (INDEPENDENT_AMBULATORY_CARE_PROVIDER_SITE_OTHER): Admitting: Internal Medicine

## 2023-09-03 ENCOUNTER — Encounter: Payer: Self-pay | Admitting: Internal Medicine

## 2023-09-03 VITALS — BP 114/70 | HR 98 | Temp 98.8°F | Ht 65.0 in | Wt 260.2 lb

## 2023-09-03 DIAGNOSIS — R29898 Other symptoms and signs involving the musculoskeletal system: Secondary | ICD-10-CM

## 2023-09-03 DIAGNOSIS — G8929 Other chronic pain: Secondary | ICD-10-CM | POA: Diagnosis not present

## 2023-09-03 DIAGNOSIS — E1142 Type 2 diabetes mellitus with diabetic polyneuropathy: Secondary | ICD-10-CM

## 2023-09-03 DIAGNOSIS — M545 Low back pain, unspecified: Secondary | ICD-10-CM

## 2023-09-03 DIAGNOSIS — R5383 Other fatigue: Secondary | ICD-10-CM

## 2023-09-03 DIAGNOSIS — I10 Essential (primary) hypertension: Secondary | ICD-10-CM

## 2023-09-03 DIAGNOSIS — R0609 Other forms of dyspnea: Secondary | ICD-10-CM

## 2023-09-03 MED ORDER — MOUNJARO 5 MG/0.5ML ~~LOC~~ SOAJ: 5.0000 mg | SUBCUTANEOUS | 1 refills | Status: DC

## 2023-09-03 NOTE — Assessment & Plan Note (Signed)
Chronic BP controlled Working on weight loss Continue amlodipine 10 mg daily,

## 2023-09-03 NOTE — Assessment & Plan Note (Signed)
 Chronic  Lab Results  Component Value Date   HGBA1C 7.0 (H) 07/19/2023   Sugars improved not ideally controlled Continue glipizide 20 mg twice daily, metformin XR 1000 mg twice daily, continue Mounajro 5 mg weekly - no increase due to GI side effects Stressed compliance with medication Continue diabetic diet Continue weight loss efforts

## 2023-09-06 NOTE — Telephone Encounter (Signed)
>>   Sep 06, 2023  2:57 PM Eunice Blase wrote: Received call from [facility] ph: [phone], fax: [phone] per Maralyn Sago regarding Power Chair group 2, faxed on 07/08/2023, 07/26/23, 08/16/23, 08/27/23 with no response. Please complete and return.

## 2023-09-23 ENCOUNTER — Other Ambulatory Visit: Payer: Self-pay | Admitting: Internal Medicine

## 2023-09-26 ENCOUNTER — Encounter: Payer: Self-pay | Admitting: Internal Medicine

## 2023-09-27 DIAGNOSIS — Z79891 Long term (current) use of opiate analgesic: Secondary | ICD-10-CM | POA: Diagnosis not present

## 2023-09-27 DIAGNOSIS — G894 Chronic pain syndrome: Secondary | ICD-10-CM | POA: Diagnosis not present

## 2023-10-06 DIAGNOSIS — M170 Bilateral primary osteoarthritis of knee: Secondary | ICD-10-CM | POA: Diagnosis not present

## 2023-10-25 DIAGNOSIS — G894 Chronic pain syndrome: Secondary | ICD-10-CM | POA: Diagnosis not present

## 2023-10-25 DIAGNOSIS — Z79891 Long term (current) use of opiate analgesic: Secondary | ICD-10-CM | POA: Diagnosis not present

## 2023-10-26 ENCOUNTER — Ambulatory Visit: Payer: Self-pay

## 2023-10-26 ENCOUNTER — Encounter: Payer: Self-pay | Admitting: Internal Medicine

## 2023-10-26 NOTE — Telephone Encounter (Signed)
  Chief Complaint: abd pain Symptoms: abd pain and back pain Frequency: x 5 days Pertinent Negatives: Patient denies diarrhea Disposition: [] ED /[] Urgent Care (no appt availability in office) / [x] Appointment(In office/virtual)/ []  [place] [facility]/ [] Home Care/ [] Refused Recommended Disposition /[]  Mobile Bus/ []  Follow-up with PCP Additional Notes: pt states that she is experiencing lower abd pain and last Thursday it was the worse pain of her life. States that the next day the pain was gone but came back on Saturday but not as bad. States pain 7/10 in the middle and right side of her abd.  Copied from CRM [id number]. Topic: Clinical - Red Word Triage >> Oct 26, 2023 11:46 AM [place] J wrote: [facility] that prompted transfer to Nurse Triage: Severe stomach pain as well as back pain located on her lower back. Reason for Disposition  [1] MODERATE pain (e.g., interferes with normal activities) AND [2] pain comes and goes (cramps) AND [3] present > 24 hours  (Exception: Pain with Vomiting or Diarrhea - see that Guideline.)  Answer Assessment - Initial Assessment Questions 1. LOCATION: "Where does it hurt?"      Lower abd 2. RADIATION: "Does the pain shoot anywhere else?" (e.g., chest, back)     Right side 3. ONSET: "When did the pain begin?" (e.g., minutes, hours or days ago)      About 5 days ago 4. SUDDEN: "Gradual or sudden onset?"     sudden 5. PATTERN "Does the pain come and go, or is it constant?"    - If it comes and goes: "How long does it last?" "Do you have pain now?"     (Note: Comes and goes means the pain is intermittent. It goes away completely between bouts.)    - If constant: "Is it getting better, staying the same, or getting worse?"      (Note: Constant means the pain never goes away completely; most serious pain is constant and gets worse.)      constant 6. SEVERITY: "How bad is the pain?"  (e.g., Scale 1-10; mild, moderate, or severe)    - MILD (1-3):  Doesn't interfere with normal activities, abdomen soft and not tender to touch.     - MODERATE (4-7): Interferes with normal activities or awakens from sleep, abdomen tender to touch.     - SEVERE (8-10): Excruciating pain, doubled over, unable to do any normal activities.       7 7. RECURRENT SYMPTOM: "Have you ever had this type of stomach pain before?" If Yes, ask: "When was the last time?" and "What happened that time?"      no 8. CAUSE: "What do you think is causing the stomach pain?"     unknown 9. RELIEVING/AGGRAVATING FACTORS: "What makes it better or worse?" (e.g., antacids, bending or twisting motion, bowel movement)     no 10. OTHER SYMPTOMS: "Do you have any other symptoms?" (e.g., back pain, diarrhea, fever, urination pain, vomiting)       Back pain  Protocols used: Abdominal Pain - Female-A-AH

## 2023-10-26 NOTE — Progress Notes (Signed)
 Subjective:    Patient ID: Lindsay Hardy, female    DOB: [birth year], 73 y.o.   MRN: [id number]      HPI Lindsay Hardy is here for  Chief Complaint  Patient presents with   Abdominal Pain    Abdominal pain that started last Thursday; Got weaker and weaker as Thursday went by; Not sure if she passed out or fell asleep. Woke up Friday and she was fine. Then over the weekend she was SOB. Saturday blood sugar was 247.    Abdominal pain - nagging pain for a while - got much worse x 5-6 days ago.  She went to bed and the pain was bad and when she woke up the next day her pain was severe and got worse - pain  > 10/10.  She had difficulty moving because of the pain.  When she went to the bathroom - she thought she was going to pass out. Pain was across her abdomen.   Saturday - sugar was 247,  BP was ok.   Pain was severe x 1 day and has improved but has been persistent. Woke up Friday and pain was gone.  Saturday felt ok.  Sunday - pain started to come back.    Had dark BM - brown color - linzess  causes soft stools  - no change has been having regular BMs  Current pain 3/10  Currently taking hydrocodone -acetaminophen  10-325 mg-last filled 10/15/2023  Medications and allergies reviewed with patient and updated if appropriate.  Current Outpatient Medications on File Prior to Visit  Medication Sig Dispense Refill   Accu-Chek Softclix Lancets lancets USE UP TO FOUR TIMES DAILY AS DIRECTED 100 each 1   albuterol  (VENTOLIN  HFA) 108 (90 Base) MCG/ACT inhaler INHALE 2 PUFFS INTO THE LUNGS EVERY 4 (FOUR) HOURS AS NEEDED FOR SHORTNESS OF BREATH. 6.7 each 11   amLODipine  (NORVASC ) 10 MG tablet Take 1 tablet (10 mg total) by mouth daily. 90 tablet 1   aspirin  81 MG EC tablet TAKE 1 TABLET (81 MG TOTAL) BY MOUTH DAILY. 90 tablet 1   Blood Glucose Monitoring Suppl DEVI UAD to check sugars daily and as needed.  May substitute to any manufacturer covered by patient's insurance. E11.65 1 each 0   fluticasone   (FLONASE ) 50 MCG/ACT nasal spray Place 2 sprays into both nostrils daily. 16 g 6   glipiZIDE  (GLUCOTROL ) 10 MG tablet TAKE 2 TABLETS BY MOUTH 2 TIMES DAILY BEFORE A MEAL. 360 tablet 3   Glucose Blood (BLOOD GLUCOSE TEST STRIPS) STRP UAD to check sugars daily and as needed.  May substitute to any manufacturer covered by patient's insurance. E11.65 100 strip 3   HYDROcodone -acetaminophen  (NORCO) 10-325 MG tablet SMARTSIG:1 Tablet(s) By Mouth 4-6 Times Daily     ibuprofen  (ADVIL ) 800 MG tablet TAKE 1 TABLET BY MOUTH 2 TIMES DAILY AS NEEDED FOR MODERATE PAIN. 20 tablet 0   Lancet Device MISC UAD to check sugars daily and as needed.  May substitute to any manufacturer covered by patient's insurance. E11.65 1 each 0   Lancets Misc. MISC UAD to check sugars daily and as needed.  May substitute to any manufacturer covered by patient's insurance. E11.65 100 each 3   linaclotide  (LINZESS ) 145 MCG CAPS capsule TAKE 1 CAPSULE BY MOUTH DAILY BEFORE BREAKFAST. 90 capsule 1   metFORMIN  (GLUCOPHAGE -XR) 500 MG 24 hr tablet TAKE 2 TABLETS BY MOUTH TWICE DAILY BEFORE A MEAL 360 tablet 1   Misc. Devices (BARIATRIC ROLLATOR) MISC Use as directed.  Dx  M54.9 1 each 0   montelukast  (SINGULAIR ) 10 MG tablet TAKE 1 TABLET BY MOUTH EVRY DAY AT BEDTIME 90 tablet 1   naloxone  (NARCAN ) nasal spray 4 mg/0.1 mL 1 spray in one nostril x1; may repeat dose in alternate nostrils q2-3min prn 1 each 1   potassium chloride  (MICRO-K ) 10 MEQ CR capsule TAKE 1 CAPSULE BY MOUTH TWICE A DAY 180 capsule 1   RESTASIS 0.05 % ophthalmic emulsion      tirzepatide  (MOUNJARO ) 5 MG/0.5ML Pen Inject 5 mg into the skin once a week. 6 mL 1   No current facility-administered medications on file prior to visit.    Review of Systems  Constitutional:  Positive for diaphoresis. Negative for fever.  Respiratory:  Positive for shortness of breath.   Cardiovascular:  Negative for chest pain.  Gastrointestinal:  Positive for abdominal pain and  constipation. Negative for nausea and vomiting.  Genitourinary:  Positive for frequency. Negative for dysuria and hematuria.       Objective:   Vitals:   10/27/23 1338  BP: (!) 142/92  Pulse: 95  Temp: 99.3 F (37.4 C)  SpO2: 95%   BP Readings from Last 3 Encounters:  10/27/23 (!) 142/92  09/03/23 114/70  07/19/23 134/84   Wt Readings from Last 3 Encounters:  10/27/23 253 lb (114.8 kg)  09/03/23 260 lb 3.2 oz (118 kg)  07/19/23 262 lb (118.8 kg)   Body mass index is 42.1 kg/m.    Physical Exam Constitutional:      General: She is not in acute distress.    Appearance: Normal appearance.  HENT:     Head: Normocephalic and atraumatic.  Eyes:     Conjunctiva/sclera: Conjunctivae normal.  Cardiovascular:     Rate and Rhythm: Normal rate and regular rhythm.     Heart sounds: Normal heart sounds.  Pulmonary:     Effort: Pulmonary effort is normal. No respiratory distress.     Breath sounds: Normal breath sounds. No wheezing.  Abdominal:     General: Bowel sounds are normal. There is no distension.     Palpations: Abdomen is soft. There is no mass.     Tenderness: There is abdominal tenderness (tenderness across lower abdomen and mid abdomen b/l). There is no guarding or rebound.     Hernia: No hernia is present.  Musculoskeletal:     Cervical back: Neck supple.     Right lower leg: No edema.     Left lower leg: No edema.  Lymphadenopathy:     Cervical: No cervical adenopathy.  Skin:    General: Skin is warm and dry.     Findings: No rash.  Neurological:     Mental Status: She is alert. Mental status is at baseline.  Psychiatric:        Mood and Affect: Mood normal.        Behavior: Behavior normal.            Assessment & Plan:    See Problem List for Assessment and Plan of chronic medical problems.

## 2023-10-27 ENCOUNTER — Ambulatory Visit (INDEPENDENT_AMBULATORY_CARE_PROVIDER_SITE_OTHER): Admitting: Internal Medicine

## 2023-10-27 ENCOUNTER — Ambulatory Visit: Payer: 59 | Admitting: Podiatry

## 2023-10-27 VITALS — BP 136/78 | HR 95 | Temp 99.3°F | Ht 65.0 in | Wt 253.0 lb

## 2023-10-27 DIAGNOSIS — R1030 Lower abdominal pain, unspecified: Secondary | ICD-10-CM | POA: Diagnosis not present

## 2023-10-27 DIAGNOSIS — Z7984 Long term (current) use of oral hypoglycemic drugs: Secondary | ICD-10-CM

## 2023-10-27 DIAGNOSIS — Z7985 Long-term (current) use of injectable non-insulin antidiabetic drugs: Secondary | ICD-10-CM | POA: Diagnosis not present

## 2023-10-27 DIAGNOSIS — E1142 Type 2 diabetes mellitus with diabetic polyneuropathy: Secondary | ICD-10-CM | POA: Diagnosis not present

## 2023-10-27 DIAGNOSIS — I10 Essential (primary) hypertension: Secondary | ICD-10-CM | POA: Diagnosis not present

## 2023-10-27 DIAGNOSIS — R350 Frequency of micturition: Secondary | ICD-10-CM | POA: Diagnosis not present

## 2023-10-27 LAB — CBC WITH DIFFERENTIAL/PLATELET
Basophils Absolute: 0 10*3/uL (ref 0.0–0.1)
Basophils Relative: 0.5 % (ref 0.0–3.0)
Eosinophils Absolute: 0.1 10*3/uL (ref 0.0–0.7)
Eosinophils Relative: 1 % (ref 0.0–5.0)
HCT: 33.6 % — ABNORMAL LOW (ref 36.0–46.0)
Hemoglobin: 10.7 g/dL — ABNORMAL LOW (ref 12.0–15.0)
Lymphocytes Relative: 21.6 % (ref 12.0–46.0)
Lymphs Abs: 1.2 10*3/uL (ref 0.7–4.0)
MCHC: 31.9 g/dL (ref 30.0–36.0)
MCV: 77.5 fl — ABNORMAL LOW (ref 78.0–100.0)
Monocytes Absolute: 0.6 10*3/uL (ref 0.1–1.0)
Monocytes Relative: 10.5 % (ref 3.0–12.0)
Neutro Abs: 3.8 10*3/uL (ref 1.4–7.7)
Neutrophils Relative %: 66.4 % (ref 43.0–77.0)
Platelets: 352 10*3/uL (ref 150.0–400.0)
RBC: 4.33 Mil/uL (ref 3.87–5.11)
RDW: 14.4 % (ref 11.5–15.5)
WBC: 5.7 10*3/uL (ref 4.0–10.5)

## 2023-10-27 LAB — COMPREHENSIVE METABOLIC PANEL WITH GFR
ALT: 9 U/L (ref 0–35)
AST: 11 U/L (ref 0–37)
Albumin: 3.8 g/dL (ref 3.5–5.2)
Alkaline Phosphatase: 68 U/L (ref 39–117)
BUN: 10 mg/dL (ref 6–23)
CO2: 29 meq/L (ref 19–32)
Calcium: 8.8 mg/dL (ref 8.4–10.5)
Chloride: 100 meq/L (ref 96–112)
Creatinine, Ser: 0.74 mg/dL (ref 0.40–1.20)
GFR: 79.71 mL/min (ref >60.00)
Glucose, Bld: 178 mg/dL — ABNORMAL HIGH (ref 70–99)
Potassium: 3.6 meq/L (ref 3.5–5.1)
Sodium: 136 meq/L (ref 135–145)
Total Bilirubin: 0.3 mg/dL (ref 0.2–1.2)
Total Protein: 7.3 g/dL (ref 6.0–8.3)

## 2023-10-27 LAB — URINALYSIS, ROUTINE W REFLEX MICROSCOPIC
Leukocytes,Ua: NEGATIVE
Nitrite: NEGATIVE
Specific Gravity, Urine: >=1.03 — AB (ref 1.000–1.030)
Total Protein, Urine: 30 — AB
Urine Glucose: NEGATIVE
Urobilinogen, UA: 1 (ref 0.0–1.0)
pH: 6 (ref 5.0–8.0)

## 2023-10-27 LAB — MICROALBUMIN / CREATININE URINE RATIO
Creatinine,U: 355.6 mg/dL
Microalb Creat Ratio: 28.8 mg/g (ref 0.0–30.0)
Microalb, Ur: 10.3 mg/dL — ABNORMAL HIGH (ref 0.0–1.9)

## 2023-10-27 LAB — AMYLASE: Amylase: 19 U/L — ABNORMAL LOW (ref 27–131)

## 2023-10-27 LAB — LIPASE: Lipase: 315 U/L — ABNORMAL HIGH (ref 11.0–59.0)

## 2023-10-27 MED ORDER — ACCU-CHEK SOFTCLIX LANCET DEV KIT: PACK | 0 refills | Status: DC

## 2023-10-27 NOTE — Patient Instructions (Signed)
      Blood work was ordered.       Medications changes include :   None   Ct abdomen/pelvis ordered

## 2023-10-27 NOTE — Assessment & Plan Note (Signed)
 Ua, ucx to r/o infection given abd pain

## 2023-10-27 NOTE — Assessment & Plan Note (Signed)
 Chronic BP not ideally controlled but better on repeat Working on weight loss Continue amlodipine  10 mg daily,

## 2023-10-27 NOTE — Assessment & Plan Note (Signed)
 Acute Started several day ago - worse 5-6 days ago and was severe for a day - has improved but has persistent pain Tender on exam Ddx - diverticulosis, severe constipation, pancreatitis, appendicitis, less likely kidney stone Ua, ucx, cbc, cmp, lipase, amylase

## 2023-10-27 NOTE — Assessment & Plan Note (Signed)
 Chronic  Lab Results  Component Value Date   HGBA1C 7.0 (H) 07/19/2023   Sugars improved not ideally controlled Continue glipizide  20 mg twice daily, metformin  XR 1000 mg twice daily, continue Mounajro 5 mg weekly - no increase due to GI side effects Monitor sugars - if they are not improving will adjust meds

## 2023-10-28 ENCOUNTER — Ambulatory Visit: Payer: Self-pay | Admitting: Internal Medicine

## 2023-10-28 ENCOUNTER — Encounter: Payer: Self-pay | Admitting: Internal Medicine

## 2023-10-28 ENCOUNTER — Ambulatory Visit
Admission: RE | Admit: 2023-10-28 | Discharge: 2023-10-28 | Disposition: A | Discharge status: Home / Self Care | Source: Ambulatory Visit | Attending: Internal Medicine | Admitting: Internal Medicine

## 2023-10-28 DIAGNOSIS — K5712 Diverticulitis of small intestine without perforation or abscess without bleeding: Secondary | ICD-10-CM | POA: Insufficient documentation

## 2023-10-28 DIAGNOSIS — R1030 Lower abdominal pain, unspecified: Secondary | ICD-10-CM

## 2023-10-28 DIAGNOSIS — K5710 Diverticulosis of small intestine without perforation or abscess without bleeding: Secondary | ICD-10-CM | POA: Diagnosis not present

## 2023-10-28 LAB — URINE CULTURE: Result:: NO GROWTH

## 2023-10-28 MED ORDER — IOPAMIDOL (ISOVUE-370) INJECTION 76%
80.0000 mL | Freq: Once | INTRAVENOUS | Status: AC | PRN
  Administered 2023-10-28: 80 mL via INTRAVENOUS

## 2023-10-28 MED ORDER — AMOXICILLIN-POT CLAVULANATE 875-125 MG PO TABS: 1.0000 | ORAL_TABLET | Freq: Two times a day (BID) | ORAL | 0 refills | Status: AC

## 2023-10-29 NOTE — Telephone Encounter (Signed)
 Copied from CRM [id number]. Topic: Clinical - Lab/Test Results >> Oct 28, 2023  4:57 PM Lindsay Hardy wrote: Reason for CRM: Pt called to go over test results, relay test results pt understood.

## 2023-11-03 ENCOUNTER — Other Ambulatory Visit: Payer: Self-pay | Admitting: Internal Medicine

## 2023-11-03 ENCOUNTER — Ambulatory Visit: Payer: Self-pay

## 2023-11-03 MED ORDER — FLUCONAZOLE 150 MG PO TABS: 150.0000 mg | ORAL_TABLET | Freq: Once | ORAL | 0 refills | Status: AC

## 2023-11-03 NOTE — Telephone Encounter (Signed)
 Copied from CRM [id number]. Topic: Clinical - Red Word Triage >> Nov 03, 2023 10:46 AM [place] J wrote: [facility] that prompted transfer to Nurse Triage: Patient is having itching, swelling, and b.tns urning in her vaginal area.  Chief Complaint: vaginal itching, swelling, burning Symptoms: see above Frequency: since the weekend Pertinent Negatives: Patient denies fever, pain with urination Disposition: [] ED /[] Urgent Care (no appt availability in office) / [] Appointment(In office/virtual)/ []  [place] [facility]/ [] Home Care/ [x] Refused Recommended Disposition /[] [place] [facility]/ []  Follow-up with PCP Additional Notes: Wants a cream called in  for vaginal itching.  Declined apt.  Care advice given, denies questions; instructed to go to ER if becomes worse.   Reason for Disposition  MODERATE-SEVERE itching (i.e., interferes with school, work, or sleep)  Answer Assessment - Initial Assessment Questions 1. SYMPTOM: "What's the main symptom you're concerned about?" (e.g., pain, itching, dryness)     Itching, mostly on outside 2. LOCATION: "Where is the  itching located?" (e.g., inside/outside, left/right)     Vagnial area 3. ONSET: "When did the  itching  start?"     Last week 4. PAIN: "Is there any pain?" If Yes, ask: "How bad is it?" (Scale: 1-10; mild, moderate, severe)   -  MILD (1-3): Doesn't interfere with normal activities.    -  MODERATE (4-7): Interferes with normal activities (e.g., work or school) or awakens from sleep.     -  SEVERE (8-10): Excruciating pain, unable to do any normal activities.     burning 5. ITCHING: "Is there any itching?" If Yes, ask: "How bad is it?" (Scale: 1-10; mild, moderate, severe)     severe 6. CAUSE: "What do you think is causing the discharge?" "Have you had the same problem before? What happened then?"     "Uti" 7. OTHER SYMPTOMS: "Do you have any other symptoms?" (e.g., fever, itching, vaginal bleeding, pain with urination, injury to  genital area, vaginal foreign body)     Itching, burning 8. PREGNANCY: "Is there any chance you are pregnant?" "When was your last menstrual period?"     na  Protocols used: Vaginal Symptoms-A-AH

## 2023-11-03 NOTE — Telephone Encounter (Signed)
 Copied from CRM [id number]. Topic: Clinical - Red Word Triage >> Nov 03, 2023 10:46 AM [place] J wrote: [facility] that prompted transfer to Nurse Triage: Patient is having itching, swelling, and burning in her vaginal area. >> Nov 03, 2023  1:39 PM Kita Perish H wrote: Patient following up on message sent to office earlier regarding vaginal itching, advised patient office will reach out by end of day.  [place] [phone]

## 2023-11-03 NOTE — Telephone Encounter (Signed)
 Spoke with patient today.

## 2023-11-03 NOTE — Telephone Encounter (Signed)
 Diflucan  sent to pharmacy -- sent in different encounter

## 2023-11-06 DIAGNOSIS — M170 Bilateral primary osteoarthritis of knee: Secondary | ICD-10-CM | POA: Diagnosis not present

## 2023-11-15 ENCOUNTER — Telehealth: Payer: Self-pay | Admitting: Internal Medicine

## 2023-11-15 NOTE — Telephone Encounter (Signed)
 Patient scheduled.

## 2023-11-15 NOTE — Telephone Encounter (Signed)
 Copied from CRM [id number]. Topic: Clinical - Medical Advice >> Nov 15, 2023 11:30 AM Magdalene [employer] wrote: Reason for CRM: Patient called to request to speak with Dr. Donnette Gal to notify her that she is done with meds but still having stomach issues, severe stomache pain. frequent bowl movements. Patient declined to be transferred to NT and just wanted message sent to Dr. Donnette Gal for a call back.

## 2023-11-15 NOTE — Telephone Encounter (Signed)
 Stop Mounjaro .  Come for follow-up.

## 2023-11-18 ENCOUNTER — Ambulatory Visit

## 2023-11-18 VITALS — Ht 66.0 in | Wt 253.0 lb

## 2023-11-18 DIAGNOSIS — Z0000 Encounter for general adult medical examination without abnormal findings: Secondary | ICD-10-CM | POA: Diagnosis not present

## 2023-11-18 NOTE — Patient Instructions (Addendum)
 Ms. Stryker , Thank you for taking time out of your busy schedule to complete your Annual Wellness Visit with me. I enjoyed our conversation and look forward to speaking with you again next year. I, as well as your care team,  appreciate your ongoing commitment to your health goals. Please review the following plan we discussed and let me know if I can assist you in the future. Your Game plan/ To Do List   Follow up Visits: Next Medicare AWV with our clinical staff: 11/20/2024   Have you seen your provider in the last 6 months (3 months if uncontrolled diabetes)? Yes Next Office Visit with your provider: 01/07/2024 - 6-mth follow up  Clinician Recommendations:  Aim for 30 minutes of exercise or brisk walking, 6-8 glasses of water, and 5 servings of fruits and vegetables each day. Educated and advised on getting the Tdap, Shingles, Pneumonia, and COVID vaccines in 2025.  Also educated and advised on having a Mammogram.      This is a list of the screening recommended for you and due dates:  Health Maintenance  Topic Date Due   DTaP/Tdap/Td vaccine (1 - Tdap) Never done   Zoster (Shingles) Vaccine (1 of 2) Never done   COVID-19 Vaccine (3 - Pfizer risk series) 06/07/2020   Pneumococcal Vaccine for age over 61 (1 of 2 - PCV) 07/18/2024*   Mammogram  07/18/2024*   Flu Shot  01/07/2024   Hemoglobin A1C  01/16/2024   Eye exam for diabetics  01/21/2024   Complete foot exam   02/03/2024   Yearly kidney function blood test for diabetes  10/26/2024   Yearly kidney health urinalysis for diabetes  10/26/2024   Medicare Annual Wellness Visit  11/17/2024   Hepatitis C Screening  Completed   HPV Vaccine  Aged Out   Meningitis B Vaccine  Aged Out   DEXA scan (bone density measurement)  Discontinued   Colon Cancer Screening  Discontinued  *Topic was postponed. The date shown is not the original due date.    Advanced directives: (Declined) Advance directive discussed with you today. Even though you  declined this today, please call our office should you change your mind, and we can give you the proper paperwork for you to fill out. Advance Care Planning is important because it:  [x]  Makes sure you receive the medical care that is consistent with your values, goals, and preferences  [x]  It provides guidance to your family and loved ones and reduces their decisional burden about whether or not they are making the right decisions based on your wishes.  Follow the link provided in your after visit summary or read over the paperwork we have mailed to you to help you started getting your Advance Directives in place. If you need assistance in completing these, please reach out to us  so that we can help you!   Managing Pain Without Opioids Opioids are strong medicines used to treat moderate to severe pain. For some people, especially those who have long-term (chronic) pain, opioids may not be the best choice for pain management due to: Side effects like nausea, constipation, and sleepiness. The risk of addiction (opioid use disorder). The longer you take opioids, the greater your risk of addiction. Pain that lasts for more than 3 months is called chronic pain. Managing chronic pain usually requires more than one approach and is often provided by a team of health care providers working together (multidisciplinary approach). Pain management may be done at a pain  management center or pain clinic. How to manage pain without the use of opioids Use non-opioid medicines Non-opioid medicines for pain may include: Over-the-counter or prescription non-steroidal anti-inflammatory drugs (NSAIDs). These may be the first medicines used for pain. They work well for muscle and bone pain, and they reduce swelling. Acetaminophen . This over-the-counter medicine may work well for milder pain but not swelling. Antidepressants. These may be used to treat chronic pain. A certain type of antidepressant (tricyclics) is often  used. These medicines are given in lower doses for pain than when used for depression. Anticonvulsants. These are usually used to treat seizures but may also reduce nerve (neuropathic) pain. Muscle relaxants. These relieve pain caused by sudden muscle tightening (spasms). You may also use a pain medicine that is applied to the skin as a patch, cream, or gel (topical analgesic), such as a numbing medicine. These may cause fewer side effects than medicines taken by mouth. Do certain therapies as directed Some therapies can help with pain management. They include: Physical therapy. You will do exercises to gain strength and flexibility. A physical therapist may teach you exercises to move and stretch parts of your body that are weak, stiff, or painful. You can learn these exercises at physical therapy visits and practice them at home. Physical therapy may also involve: Massage. Heat wraps or applying heat or cold to affected areas. Electrical signals that interrupt pain signals (transcutaneous electrical nerve stimulation, TENS). Weak lasers that reduce pain and swelling (low-level laser therapy). Signals from your body that help you learn to regulate pain (biofeedback). Occupational therapy. This helps you to learn ways to function at home and work with less pain. Recreational therapy. This involves trying new activities or hobbies, such as a physical activity or drawing. Mental health therapy, including: Cognitive behavioral therapy (CBT). This helps you learn coping skills for dealing with pain. Acceptance and commitment therapy (ACT) to change the way you think and react to pain. Relaxation therapies, including muscle relaxation exercises and mindfulness-based stress reduction. Pain management counseling. This may be individual, family, or group counseling.  Receive medical treatments Medical treatments for pain management include: Nerve block injections. These may include a pain blocker and  anti-inflammatory medicines. You may have injections: Near the spine to relieve chronic back or neck pain. Into joints to relieve back or joint pain. Into nerve areas that supply a painful area to relieve body pain. Into muscles (trigger point injections) to relieve some painful muscle conditions. A medical device placed near your spine to help block pain signals and relieve nerve pain or chronic back pain (spinal cord stimulation device). Acupuncture. Follow these instructions at home Medicines Take over-the-counter and prescription medicines only as told by your health care provider. If you are taking pain medicine, ask your health care providers about possible side effects to watch out for. Do not drive or use heavy machinery while taking prescription opioid pain medicine. Lifestyle  Do not use drugs or alcohol  to reduce pain. If you drink alcohol , limit how much you have to: 0-1 drink a day for women who are not pregnant. 0-2 drinks a day for men. Know how much alcohol  is in a drink. In the [place] one drink equals one 12 oz bottle of beer (355 mL), one 5 oz glass of wine (148 mL), or one 1 oz glass of hard liquor (44 mL). Do not use any products that contain nicotine or tobacco. These products include cigarettes, chewing tobacco, and vaping devices, such as e-cigarettes. If  you need help quitting, ask your health care provider. Eat a healthy diet and maintain a healthy weight. Poor diet and excess weight may make pain worse. Eat foods that are high in fiber. These include fresh fruits and vegetables, whole grains, and beans. Limit foods that are high in fat and processed sugars, such as fried and sweet foods. Exercise regularly. Exercise lowers stress and may help relieve pain. Ask your health care provider what activities and exercises are safe for you. If your health care provider approves, join an exercise class that combines movement and stress reduction. Examples include yoga and tai  chi. Get enough sleep. Lack of sleep may make pain worse. Lower stress as much as possible. Practice stress reduction techniques as told by your therapist. General instructions Work with all your pain management providers to find the treatments that work best for you. You are an important member of your pain management team. There are many things you can do to reduce pain on your own. Consider joining an online or in-person support group for people who have chronic pain. Keep all follow-up visits. This is important. Where to find more information You can find more information about managing pain without opioids from: American Academy of Pain Medicine: painmed.org Institute for Chronic Pain: instituteforchronicpain.org American Chronic Pain Association: theacpa.org Contact a health care provider if: You have side effects from pain medicine. Your pain gets worse or does not get better with treatments or home therapy. You are struggling with anxiety or depression. Summary Many types of pain can be managed without opioids. Chronic pain may respond better to pain management without opioids. Pain is best managed when you and a team of health care providers work together. Pain management without opioids may include non-opioid medicines, medical treatments, physical therapy, mental health therapy, and lifestyle changes. Tell your health care providers if your pain gets worse or is not being managed well enough. This information is not intended to replace advice given to you by your health care provider. Make sure you discuss any questions you have with your health care provider. Document Revised: 09/04/2020 Document Reviewed: 09/04/2020 Elsevier Patient Education  2024 [employer].

## 2023-11-18 NOTE — Progress Notes (Signed)
 Subjective:   Lindsay Hardy is a 73 y.o. who presents for a Medicare Wellness preventive visit.  As a reminder, Annual Wellness Visits don't include a physical exam, and some assessments may be limited, especially if this visit is performed virtually. We may recommend an in-person follow-up visit with your provider if needed.  Visit Complete: Virtual I connected with  Feiga Nadel on 11/18/23 by a audio enabled telemedicine application and verified that I am speaking with the correct person using two identifiers.  Patient Location: Home  Provider Location: Office/Clinic  I discussed the limitations of evaluation and management by telemedicine. The patient expressed understanding and agreed to proceed.  Vital Signs: Because this visit was a virtual/telehealth visit, some criteria may be missing or patient reported. Any vitals not documented were not able to be obtained and vitals that have been documented are patient reported.  VideoDeclined- This patient declined Librarian, academic. Therefore the visit was completed with audio only.  Persons Participating in Visit: Patient.  AWV Questionnaire: No: Patient Medicare AWV questionnaire was not completed prior to this visit.  Cardiac Risk Factors include: advanced age (>15men, >61 women);dyslipidemia;diabetes mellitus;hypertension;obesity (BMI >30kg/m2)     Objective:    Today's Vitals   11/18/23 1503  Weight: 253 lb (114.8 kg)  Height: 5' 6 (1.676 m)   Body mass index is 40.84 kg/m.     11/18/2023    3:03 PM 04/15/2023   12:35 PM 03/05/2022    3:23 PM 10/14/2020    3:52 PM 02/01/2018    5:18 PM 02/17/2017    2:27 PM 02/28/2015    3:10 PM  Advanced Directives  Does Patient Have a Medical Advance Directive? No No No No No  No  Yes   Copy of Healthcare Power of Attorney in Chart?       Yes   Would patient like information on creating a medical advance directive? No - Patient declined No - Patient  declined No - Patient declined No - Patient declined No - Patient declined  No - Patient declined       Data saved with a previous flowsheet row definition    Current Medications (verified) Outpatient Encounter Medications as of 11/18/2023  Medication Sig   Accu-Chek Softclix Lancets lancets USE UP TO FOUR TIMES DAILY AS DIRECTED   albuterol  (VENTOLIN  HFA) 108 (90 Base) MCG/ACT inhaler INHALE 2 PUFFS INTO THE LUNGS EVERY 4 (FOUR) HOURS AS NEEDED FOR SHORTNESS OF BREATH.   amLODipine  (NORVASC ) 10 MG tablet Take 1 tablet (10 mg total) by mouth daily.   aspirin  81 MG EC tablet TAKE 1 TABLET (81 MG TOTAL) BY MOUTH DAILY.   Blood Glucose Monitoring Suppl DEVI UAD to check sugars daily and as needed.  May substitute to any manufacturer covered by patient's insurance. E11.65   fluticasone  (FLONASE ) 50 MCG/ACT nasal spray Place 2 sprays into both nostrils daily.   glipiZIDE  (GLUCOTROL ) 10 MG tablet TAKE 2 TABLETS BY MOUTH 2 TIMES DAILY BEFORE A MEAL.   Glucose Blood (BLOOD GLUCOSE TEST STRIPS) STRP UAD to check sugars daily and as needed.  May substitute to any manufacturer covered by patient's insurance. E11.65   HYDROcodone -acetaminophen  (NORCO) 10-325 MG tablet SMARTSIG:1 Tablet(s) By Mouth 4-6 Times Daily   ibuprofen  (ADVIL ) 800 MG tablet TAKE 1 TABLET BY MOUTH 2 TIMES DAILY AS NEEDED FOR MODERATE PAIN.   Lancet Device MISC UAD to check sugars daily and as needed.  May substitute to any manufacturer covered by patient's  insurance. E11.65   Lancets Misc. (ACCU-CHEK SOFTCLIX LANCET DEV) KIT UAD for lancets to check sugars   Lancets Misc. MISC UAD to check sugars daily and as needed.  May substitute to any manufacturer covered by patient's insurance. E11.65   linaclotide  (LINZESS ) 145 MCG CAPS capsule TAKE 1 CAPSULE BY MOUTH DAILY BEFORE BREAKFAST.   metFORMIN  (GLUCOPHAGE -XR) 500 MG 24 hr tablet TAKE 2 TABLETS BY MOUTH TWICE DAILY BEFORE A MEAL   Misc. Devices (BARIATRIC ROLLATOR) MISC Use as  directed.  Dx  M54.9   montelukast  (SINGULAIR ) 10 MG tablet TAKE 1 TABLET BY MOUTH EVRY DAY AT BEDTIME   naloxone  (NARCAN ) nasal spray 4 mg/0.1 mL 1 spray in one nostril x1; may repeat dose in alternate nostrils q2-3min prn   potassium chloride  (MICRO-K ) 10 MEQ CR capsule TAKE 1 CAPSULE BY MOUTH TWICE A DAY   RESTASIS 0.05 % ophthalmic emulsion    tirzepatide  (MOUNJARO ) 5 MG/0.5ML Pen Inject 5 mg into the skin once a week.   No facility-administered encounter medications on file as of 11/18/2023.    Allergies (verified) Atorvastatin, Clopidogrel bisulfate, Crestor  [rosuvastatin  calcium ], Fexofenadine, Furosemide, Gabapentin , Hydrochlorothiazide , Hydrochlorothiazide -triamterene, Lisinopril, Metoprolol tartrate, Mometasone furoate, Telmisartan , and Ozempic  (0.25 or 0.5 mg-dose) [semaglutide (0.25 or 0.5mg -dos)]   History: Past Medical History:  Diagnosis Date   Asthma    CAD (coronary artery disease) 11/2003    RCA 60% stenosis, LAD diffuse   Chicken pox    CVA (cerebrovascular accident) (HCC) 2001    right frontoparietal cortical CVA - MRI September 2001   Diabetes mellitus    Diastolic dysfunction     ejection fraction 55-65% on 2-D echo May 2006   Empty sella syndrome (HCC)     based on MRI September 2001, related to obesity, hypertension, it was determined that patient has not required treatment but will be monitored with TSH, ACTH, cortisol, testosterone, prolactin, growth hormone   GERD (gastroesophageal reflux disease)     EGD June 2004 -  followed by Dr. Willy Harvest   Granulomatous disease, chronic (HCC)     in right upper lobe per x-ray December 2005   Hyperlipidemia    Hypertension    NAFLD (nonalcoholic fatty liver disease)     based on ultrasound June 2002, elevated transaminase   OA (osteoarthritis)     DJP coracoclavicular ligament, left patella osteophytes June 2002, L4-S1 spondylosis, degenerative disc disease with bulge. I in October 2001, the C2-C4 spondylosis without  stenosis and with spurs based on x-ray July 2005; diffuse idiopathic skeletal hyperostosis with bilateral hip lumbar degenerative changes   on x-ray February 2005, bilateral plantar calcaneal spurs   Reactive airway disease     peak flow 180 in May 2005, chronic sinusitis with PND, bronchitis December 2002 and strep pneumonia April 2007,  FEC 66, MCV 60, ratio is 69, DLCO 57,  moderate restrictive and mild obstructive disease  on spirometry May 2005   Past Surgical History:  Procedure Laterality Date   CARPAL TUNNEL RELEASE  1992    left arm 1992   CHOLECYSTECTOMY  2003   TONSILLECTOMY AND ADENOIDECTOMY     Family History  Problem Relation Age of Onset   Lung cancer Mother    Hypertension Mother    Lung cancer Sister    Hypertension Sister    Social History   Socioeconomic History   Marital status: Single    Spouse name: Not on file   Number of children: 1   Years of education: 14   Highest  education level: Some college, no degree  Occupational History   Occupation:  former Agricultural engineer  Tobacco Use   Smoking status: Former    Current packs/day: 0.25    Average packs/day: 0.3 packs/day for 4.0 years (1.0 ttl pk-yrs)    Types: Cigarettes   Smokeless tobacco: Never   Tobacco comments:    smoked in 20's   Vaping Use   Vaping status: Never Used  Substance and Sexual Activity   Alcohol  use: No    Alcohol /week: 0.0 standard drinks of alcohol    Drug use: No   Sexual activity: Not Currently    Birth control/protection: Post-menopausal  Other Topics Concern   Not on file  Social History Narrative   Born and raised in [place], [place]. Currently lives in a private residence with her nephew and his wife. Fun: Church activities, spending time with family   Denies any religious beliefs that would effect health care.    Social Drivers of Corporate investment banker Strain: Low Risk  (11/18/2023)   Overall Financial Resource Strain (CARDIA)    Difficulty of Paying Living  Expenses: Not hard at all  Food Insecurity: No Food Insecurity (11/18/2023)   Hunger Vital Sign    Worried About Running Out of Food in the Last Year: Never true    Ran Out of Food in the Last Year: Never true  Transportation Needs: No Transportation Needs (11/18/2023)   PRAPARE - Administrator, Civil Service (Medical): No    Lack of Transportation (Non-Medical): No  Recent Concern: Transportation Needs - Unmet Transportation Needs (09/02/2023)   PRAPARE - Administrator, Civil Service (Medical): Yes    Lack of Transportation (Non-Medical): No  Physical Activity: Inactive (11/18/2023)   Exercise Vital Sign    Days of Exercise per Week: 0 days    Minutes of Exercise per Session: 0 min  Stress: No Stress Concern Present (11/18/2023)   [employer] of Occupational Health - Occupational Stress Questionnaire    Feeling of Stress: Only a little  Social Connections: Moderately Integrated (11/18/2023)   Social Connection and Isolation Panel    Frequency of Communication with Friends and Family: More than three times a week    Frequency of Social Gatherings with Friends and Family: Once a week    Attends Religious Services: 1 to 4 times per year    Active Member of [employer] or Organizations: Yes    Attends Banker Meetings: 1 to 4 times per year    Marital Status: Divorced    Tobacco Counseling Counseling given: No Tobacco comments: smoked in 20's     Clinical Intake:  Pre-visit preparation completed: Yes  Pain : No/denies pain     BMI - recorded: 40.84 Nutritional Status: BMI > 30  Obese Nutritional Risks: None Diabetes: Yes CBG done?: No Did pt. bring in CBG monitor from home?: No  Lab Results  Component Value Date   HGBA1C 7.0 (H) 07/19/2023   HGBA1C 7.2 (A) 03/12/2023   HGBA1C 7.9 (H) 11/04/2022     How often do you need to have someone help you when you read instructions, pamphlets, or other written materials from your doctor or  pharmacy?: 1 - Never  Interpreter Needed?: No  Information entered by :: Freddrick Jaffe, CMA   Activities of Daily Living     11/18/2023    3:06 PM  In your present state of health, do you have any difficulty performing the following activities:  Hearing? 0  Vision? 0  Difficulty concentrating or making decisions? 0  Walking or climbing stairs? 0  Dressing or bathing? 0  Doing errands, shopping? 0  Preparing Food and eating ? N  Using the Toilet? N  In the past six months, have you accidently leaked urine? N  Do you have problems with loss of bowel control? N  Managing your Medications? N  Managing your Finances? N  Housekeeping or managing your Housekeeping? N    Patient Care Team: Colene Dauphin, MD as PCP - General (Internal Medicine)  I have updated your Care Teams any recent Medical Services you may have received from other providers in the past year.     Assessment:   This is a routine wellness examination for Nesreen.  Hearing/Vision screen Hearing Screening - Comments:: Denies hearing difficulties   Vision Screening - Comments:: Wears rx glasses - appt w/Dr Hope Ly in 01/2024   Goals Addressed               This Visit's Progress     Patient Stated (pt-stated)        Patient stated she plans to continue to watch diet and blood sugar levels with weight       Depression Screen     11/18/2023    3:09 PM 09/03/2023    3:15 PM 11/04/2022    2:10 PM 03/05/2022    3:26 PM 04/02/2021    4:47 PM 11/01/2019    2:26 PM 02/01/2018    5:18 PM  PHQ 2/9 Scores  PHQ - 2 Score 0 0 0 0 0 0 4  PHQ- 9 Score 3 1 0  0  12    Fall Risk     11/18/2023    3:07 PM 09/03/2023    3:15 PM 11/04/2022    2:10 PM 03/05/2022    3:23 PM 09/04/2021   12:41 PM  Fall Risk   Falls in the past year? 1 1 0 0 1  Number falls in past yr: 0 1 0 0 0  Comment 1      Injury with Fall? 1 1 0 0 0  Comment right foot toe      Risk for fall due to : History of fall(s);Impaired balance/gait  Impaired mobility No Fall Risks No Fall Risks   Follow up Falls prevention discussed;Falls evaluation completed Falls evaluation completed Falls evaluation completed Falls prevention discussed       Data saved with a previous flowsheet row definition    MEDICARE RISK AT HOME:  Medicare Risk at Home Any stairs in or around the home?: Yes (outside) If so, are there any without handrails?: No Home free of loose throw rugs in walkways, pet beds, electrical cords, etc?: Yes Adequate lighting in your home to reduce risk of falls?: Yes Life alert?: No Use of a cane, walker or w/c?: No Grab bars in the bathroom?: Yes Shower chair or bench in shower?: Yes Elevated toilet seat or a handicapped toilet?: Yes  TIMED UP AND GO:  Was the test performed?  No  Cognitive Function: 6CIT completed        11/18/2023    3:12 PM 03/05/2022    3:40 PM  6CIT Screen  What Year? 0 points 0 points  What month? 0 points 0 points  What time? 0 points 0 points  Count back from 20 0 points 0 points  Months in reverse 0 points 0 points  Repeat phrase 0 points 0  points  Total Score 0 points 0 points    Immunizations Immunization History  Administered Date(s) Administered   Fluad Quad(high Dose 65+) 04/07/2019   Fluad Trivalent(High Dose 65+) 04/16/2023   Influenza Whole 04/15/2006, 04/20/2007, 03/15/2008   Influenza, High Dose Seasonal PF 07/22/2018   PFIZER(Purple Top)SARS-COV-2 Vaccination 04/19/2020, 05/10/2020    Screening Tests Health Maintenance  Topic Date Due   DTaP/Tdap/Td (1 - Tdap) Never done   Zoster Vaccines- Shingrix (1 of 2) Never done   COVID-19 Vaccine (3 - Pfizer risk series) 06/07/2020   Pneumococcal Vaccine: 50+ Years (1 of 2 - PCV) 07/18/2024 (Originally 05/30/1970)   MAMMOGRAM  07/18/2024 (Originally 03/24/2014)   INFLUENZA VACCINE  01/07/2024   HEMOGLOBIN A1C  01/16/2024   OPHTHALMOLOGY EXAM  01/21/2024   FOOT EXAM  02/03/2024   Diabetic kidney evaluation - eGFR  measurement  10/26/2024   Diabetic kidney evaluation - Urine ACR  10/26/2024   Medicare Annual Wellness (AWV)  11/17/2024   Hepatitis C Screening  Completed   HPV VACCINES  Aged Out   Meningococcal B Vaccine  Aged Out   DEXA SCAN  Discontinued   Colonoscopy  Discontinued    Health Maintenance  Health Maintenance Due  Topic Date Due   DTaP/Tdap/Td (1 - Tdap) Never done   Zoster Vaccines- Shingrix (1 of 2) Never done   COVID-19 Vaccine (3 - Pfizer risk series) 06/07/2020   Health Maintenance Items Addressed:  I have recommended that this patient have a immunization for Pneumococcal, Shingles, and Mammogram but she declines at this time. I have discussed the risks and benefits of this procedure with her. The patient verbalizes understanding.   Additional Screening:  Vision Screening: Recommended annual ophthalmology exams for early detection of glaucoma and other disorders of the eye. Would you like a referral to an eye doctor? No    Dental Screening: Recommended annual dental exams for proper oral hygiene  Community Resource Referral / Chronic Care Management: CRR required this visit?  No   CCM required this visit?  No   Plan:    I have personally reviewed and noted the following in the patient's chart:   Medical and social history Use of alcohol , tobacco or illicit drugs  Current medications and supplements including opioid prescriptions. Patient is currently taking opioid prescriptions. Information provided to patient regarding non-opioid alternatives. Patient advised to discuss non-opioid treatment plan with their provider. Functional ability and status Nutritional status Physical activity Advanced directives List of other physicians Hospitalizations, surgeries, and ER visits in previous 12 months Vitals Screenings to include cognitive, depression, and falls Referrals and appointments  In addition, I have reviewed and discussed with patient certain preventive  protocols, quality metrics, and best practice recommendations. A written personalized care plan for preventive services as well as general preventive health recommendations were provided to patient.   Patria Bookbinder, CMA   11/18/2023   After Visit Summary: (Declined) Due to this being a telephonic visit, with patients personalized plan was offered to patient but patient Declined AVS at this time   Notes: Nothing significant to report at this time.

## 2023-11-19 ENCOUNTER — Ambulatory Visit: Admitting: Internal Medicine

## 2023-11-22 DIAGNOSIS — G894 Chronic pain syndrome: Secondary | ICD-10-CM | POA: Diagnosis not present

## 2023-11-22 DIAGNOSIS — Z79891 Long term (current) use of opiate analgesic: Secondary | ICD-10-CM | POA: Diagnosis not present

## 2023-11-22 NOTE — Patient Instructions (Addendum)
    Have blood work done today   Medications changes include :   hydrochlorothiazide  25 mg daily     A Ct scan of your  was ordered and someone will call you to schedule an appointment.     Return for follow up as scheduled.

## 2023-11-22 NOTE — Progress Notes (Unsigned)
 Subjective:    Patient ID: Lindsay Hardy, female    DOB: [birth year], 73 y.o.   MRN: [id number]      HPI Patton is here for No chief complaint on file.   Abdominal pain  - she was here 5/21 for abdominal pain.   Ct scan of Ab/pelvis showed acute duodenal diverticulitis.  Ill-defined low attenuating area in the uncinate process of the pancreas, likely reactive edema secondary to duodenal diverticulitis.  Follow-up recommended to exclude pancreatic mass.  No bowel obstruction.  Normal appendix.  Lipase was very elevated at 315, amylase located.  Mild anemia.  LFTs normal.  GFR normal.   Medications and allergies reviewed with patient and updated if appropriate.  Current Outpatient Medications on File Prior to Visit  Medication Sig Dispense Refill   Accu-Chek Softclix Lancets lancets USE UP TO FOUR TIMES DAILY AS DIRECTED 100 each 1   albuterol  (VENTOLIN  HFA) 108 (90 Base) MCG/ACT inhaler INHALE 2 PUFFS INTO THE LUNGS EVERY 4 (FOUR) HOURS AS NEEDED FOR SHORTNESS OF BREATH. 6.7 each 11   amLODipine  (NORVASC ) 10 MG tablet Take 1 tablet (10 mg total) by mouth daily. 90 tablet 1   aspirin  81 MG EC tablet TAKE 1 TABLET (81 MG TOTAL) BY MOUTH DAILY. 90 tablet 1   Blood Glucose Monitoring Suppl DEVI UAD to check sugars daily and as needed.  May substitute to any manufacturer covered by patient's insurance. E11.65 1 each 0   fluticasone  (FLONASE ) 50 MCG/ACT nasal spray Place 2 sprays into both nostrils daily. 16 g 6   glipiZIDE  (GLUCOTROL ) 10 MG tablet TAKE 2 TABLETS BY MOUTH 2 TIMES DAILY BEFORE A MEAL. 360 tablet 3   Glucose Blood (BLOOD GLUCOSE TEST STRIPS) STRP UAD to check sugars daily and as needed.  May substitute to any manufacturer covered by patient's insurance. E11.65 100 strip 3   HYDROcodone -acetaminophen  (NORCO) 10-325 MG tablet SMARTSIG:1 Tablet(s) By Mouth 4-6 Times Daily     ibuprofen  (ADVIL ) 800 MG tablet TAKE 1 TABLET BY MOUTH 2 TIMES DAILY AS NEEDED FOR MODERATE PAIN. 20  tablet 0   Lancet Device MISC UAD to check sugars daily and as needed.  May substitute to any manufacturer covered by patient's insurance. E11.65 1 each 0   Lancets Misc. (ACCU-CHEK SOFTCLIX LANCET DEV) KIT UAD for lancets to check sugars 1 kit 0   Lancets Misc. MISC UAD to check sugars daily and as needed.  May substitute to any manufacturer covered by patient's insurance. E11.65 100 each 3   linaclotide  (LINZESS ) 145 MCG CAPS capsule TAKE 1 CAPSULE BY MOUTH DAILY BEFORE BREAKFAST. 90 capsule 1   metFORMIN  (GLUCOPHAGE -XR) 500 MG 24 hr tablet TAKE 2 TABLETS BY MOUTH TWICE DAILY BEFORE A MEAL 360 tablet 1   Misc. Devices (BARIATRIC ROLLATOR) MISC Use as directed.  Dx  M54.9 1 each 0   montelukast  (SINGULAIR ) 10 MG tablet TAKE 1 TABLET BY MOUTH EVRY DAY AT BEDTIME 90 tablet 1   naloxone  (NARCAN ) nasal spray 4 mg/0.1 mL 1 spray in one nostril x1; may repeat dose in alternate nostrils q2-3min prn 1 each 1   potassium chloride  (MICRO-K ) 10 MEQ CR capsule TAKE 1 CAPSULE BY MOUTH TWICE A DAY 180 capsule 1   RESTASIS 0.05 % ophthalmic emulsion      tirzepatide  (MOUNJARO ) 5 MG/0.5ML Pen Inject 5 mg into the skin once a week. 6 mL 1   No current facility-administered medications on file prior to visit.    Review of  Systems     Objective:  There were no vitals filed for this visit. BP Readings from Last 3 Encounters:  10/27/23 136/78  09/03/23 114/70  07/19/23 134/84   Wt Readings from Last 3 Encounters:  11/18/23 253 lb (114.8 kg)  10/27/23 253 lb (114.8 kg)  09/03/23 260 lb 3.2 oz (118 kg)   There is no height or weight on file to calculate BMI.    Physical Exam         Assessment & Plan:    See Problem List for Assessment and Plan of chronic medical problems.

## 2023-11-23 ENCOUNTER — Encounter: Payer: Self-pay | Admitting: Internal Medicine

## 2023-11-23 ENCOUNTER — Ambulatory Visit: Admitting: Podiatry

## 2023-11-23 ENCOUNTER — Ambulatory Visit (INDEPENDENT_AMBULATORY_CARE_PROVIDER_SITE_OTHER): Admitting: Internal Medicine

## 2023-11-23 VITALS — BP 164/94 | HR 94 | Temp 98.6°F | Ht 66.0 in

## 2023-11-23 DIAGNOSIS — R933 Abnormal findings on diagnostic imaging of other parts of digestive tract: Secondary | ICD-10-CM | POA: Diagnosis not present

## 2023-11-23 DIAGNOSIS — R1030 Lower abdominal pain, unspecified: Secondary | ICD-10-CM

## 2023-11-23 DIAGNOSIS — K5712 Diverticulitis of small intestine without perforation or abscess without bleeding: Secondary | ICD-10-CM

## 2023-11-23 DIAGNOSIS — Z7984 Long term (current) use of oral hypoglycemic drugs: Secondary | ICD-10-CM | POA: Diagnosis not present

## 2023-11-23 DIAGNOSIS — I10 Essential (primary) hypertension: Secondary | ICD-10-CM

## 2023-11-23 DIAGNOSIS — R748 Abnormal levels of other serum enzymes: Secondary | ICD-10-CM | POA: Diagnosis not present

## 2023-11-23 DIAGNOSIS — E1142 Type 2 diabetes mellitus with diabetic polyneuropathy: Secondary | ICD-10-CM

## 2023-11-23 LAB — COMPREHENSIVE METABOLIC PANEL WITH GFR
ALT: 265 U/L — ABNORMAL HIGH (ref 0–35)
AST: 303 U/L — ABNORMAL HIGH (ref 0–37)
Albumin: 3.5 g/dL (ref 3.5–5.2)
Alkaline Phosphatase: 353 U/L — ABNORMAL HIGH (ref 39–117)
BUN: 9 mg/dL (ref 6–23)
CO2: 29 meq/L (ref 19–32)
Calcium: 8.9 mg/dL (ref 8.4–10.5)
Chloride: 99 meq/L (ref 96–112)
Creatinine, Ser: 0.78 mg/dL (ref 0.40–1.20)
GFR: 74.79 mL/min (ref >60.00)
Glucose, Bld: 394 mg/dL — ABNORMAL HIGH (ref 70–99)
Potassium: 3 meq/L — ABNORMAL LOW (ref 3.5–5.1)
Sodium: 136 meq/L (ref 135–145)
Total Bilirubin: 0.7 mg/dL (ref 0.2–1.2)
Total Protein: 6.9 g/dL (ref 6.0–8.3)

## 2023-11-23 LAB — CBC WITH DIFFERENTIAL/PLATELET
Basophils Absolute: 0 10*3/uL (ref 0.0–0.1)
Basophils Relative: 0.7 % (ref 0.0–3.0)
Eosinophils Absolute: 0.1 10*3/uL (ref 0.0–0.7)
Eosinophils Relative: 1.3 % (ref 0.0–5.0)
HCT: 34.6 % — ABNORMAL LOW (ref 36.0–46.0)
Hemoglobin: 11.3 g/dL — ABNORMAL LOW (ref 12.0–15.0)
Lymphocytes Relative: 21.6 % (ref 12.0–46.0)
Lymphs Abs: 1 10*3/uL (ref 0.7–4.0)
MCHC: 32.7 g/dL (ref 30.0–36.0)
MCV: 76.9 fl — ABNORMAL LOW (ref 78.0–100.0)
Monocytes Absolute: 0.3 10*3/uL (ref 0.1–1.0)
Monocytes Relative: 6.6 % (ref 3.0–12.0)
Neutro Abs: 3.2 10*3/uL (ref 1.4–7.7)
Neutrophils Relative %: 69.8 % (ref 43.0–77.0)
Platelets: 257 10*3/uL (ref 150.0–400.0)
RBC: 4.5 Mil/uL (ref 3.87–5.11)
RDW: 15.1 % (ref 11.5–15.5)
WBC: 4.5 10*3/uL (ref 4.0–10.5)

## 2023-11-23 LAB — LIPASE: Lipase: 337 U/L — ABNORMAL HIGH (ref 11.0–59.0)

## 2023-11-23 LAB — HEMOGLOBIN A1C: Hgb A1c MFr Bld: 10.1 % — ABNORMAL HIGH (ref 4.6–6.5)

## 2023-11-23 MED ORDER — HYDROCHLOROTHIAZIDE 25 MG PO TABS: 25.0000 mg | ORAL_TABLET | Freq: Every day | ORAL | 3 refills | Status: DC

## 2023-11-23 NOTE — Assessment & Plan Note (Signed)
 Elevated lipase with episode of duodenal diverticulitis Repeat lipase today

## 2023-11-23 NOTE — Assessment & Plan Note (Signed)
 Acute Having some lower abdominal pain intermittently-states that it occurs before bowel movement and often resolves after bowel movement Different from her duodenal diverticulitis No pain here today-abdomen nontender Needs CT scan for follow-up abnormal pancreas finding on CT scan last month She believes the pain is getting better and will let me know if it does not continue to improve/resolve

## 2023-11-23 NOTE — Assessment & Plan Note (Signed)
 Chronic  Lab Results  Component Value Date   HGBA1C 10.1 (H) 11/23/2023   Sugars improved not ideally controlled Continue glipizide  20 mg twice daily, metformin  XR 1000 mg twice daily,  Continue to hold Mounajro 5 mg until after the CT scan  monitor sugars

## 2023-11-23 NOTE — Assessment & Plan Note (Signed)
 Acute episode 10/2023 Confirmed with x-ray Treated with Augmentin  twice daily x 10 days Pain has resolved There was an ill-defined low attenuating area in the uncinate process of the pancreas and follow-up was recommended so we will order a CT scan of her abdomen pelvis to reevaluate

## 2023-11-23 NOTE — Assessment & Plan Note (Signed)
 New When she was here last month the CT scan showed that she had an ill-defined low attenuating area in the uncinate process of the pancreas-follow-up was recommended Pain that she was having at the time of the CT scan has resolved Will order CT abdomen and pelvis to reevaluate that area

## 2023-11-23 NOTE — Assessment & Plan Note (Signed)
 Chronic BP not ideally controlled but better on repeat Still elevated Continue amlodipine  10 mg daily, Start hydrochlorothiazide  25 mg daily

## 2023-11-24 ENCOUNTER — Ambulatory Visit: Payer: Self-pay | Admitting: Internal Medicine

## 2023-11-24 DIAGNOSIS — N289 Disorder of kidney and ureter, unspecified: Secondary | ICD-10-CM

## 2023-11-24 DIAGNOSIS — K869 Disease of pancreas, unspecified: Secondary | ICD-10-CM

## 2023-11-24 NOTE — Addendum Note (Signed)
 Addended by: Colene Dauphin on: 11/24/2023 07:06 PM   Modules accepted: Orders

## 2023-11-26 ENCOUNTER — Telehealth: Payer: Self-pay

## 2023-11-26 NOTE — Telephone Encounter (Signed)
 Message left for patient today and for her to return call to clinic.  Message left for her to proceed with having study done as it was highly important and she needs to get it scheduled ASAP.

## 2023-11-26 NOTE — Telephone Encounter (Signed)
 Copied from CRM [id number]. Topic: General - Other >> Nov 26, 2023  9:55 AM Melissa C wrote: Reason for CRM: patient called back to let clinical team know that she did get study scheduled.

## 2023-11-29 ENCOUNTER — Other Ambulatory Visit: Payer: Self-pay | Admitting: Internal Medicine

## 2023-11-30 ENCOUNTER — Inpatient Hospital Stay: Admission: RE | Admit: 2023-11-30 | Source: Ambulatory Visit

## 2023-11-30 ENCOUNTER — Other Ambulatory Visit: Payer: Self-pay | Admitting: Internal Medicine

## 2023-11-30 MED ORDER — INSULIN GLARGINE 100 UNIT/ML SOLOSTAR PEN: 20.0000 [IU] | PEN_INJECTOR | Freq: Every day | SUBCUTANEOUS | 5 refills | Status: DC

## 2023-11-30 MED ORDER — INSULIN PEN NEEDLE 32G X 6 MM MISC: 5 refills | Status: DC

## 2023-12-01 ENCOUNTER — Ambulatory Visit: Payer: 59 | Admitting: Podiatry

## 2023-12-01 ENCOUNTER — Telehealth: Payer: Self-pay | Admitting: Internal Medicine

## 2023-12-01 ENCOUNTER — Other Ambulatory Visit: Payer: Self-pay

## 2023-12-01 ENCOUNTER — Ambulatory Visit: Payer: Self-pay

## 2023-12-01 DIAGNOSIS — E1142 Type 2 diabetes mellitus with diabetic polyneuropathy: Secondary | ICD-10-CM

## 2023-12-01 MED ORDER — PEN NEEDLES 32G X 6 MM MISC: 5 refills | Status: DC

## 2023-12-01 MED ORDER — INSULIN PEN NEEDLE 32G X 6 MM MISC
5 refills | Status: DC
Start: 2023-12-01 — End: 2023-12-07

## 2023-12-01 NOTE — Telephone Encounter (Signed)
 FYI Only or Action Required?: Action required by provider: medication refill request and clinical question for provider.  Patient was last seen in primary care on 11/23/2023 by Geofm Glade PARAS, MD. Called Nurse Triage reporting Sinusitis. Symptoms began a week ago. Interventions attempted: Rest, hydration, or home remedies. Symptoms are: gradually worsening.  Triage Disposition: See HCP Within 4 Hours (Or PCP Triage)  Patient/caregiver understands and will follow disposition?: No, wishes to speak with PCP  Message from [place] F sent at 12/01/2023  1:34 PM EDT  Patient having irritation around eye's and nostrils - she believes she has a sinus infection and would like a medication sent to her pharmacy. Please call [phone].   Reason for Disposition  [1] SEVERE pain AND [2] not improved 2 hours after pain medicine  Answer Assessment - Initial Assessment Questions 1. LOCATION: Where does it hurt?      Around eyes and nose  2. ONSET: When did the sinus pain start?  (e.g., hours, days)      One week ago and worsening  3. SEVERITY: How bad is the pain?   (Scale 1-10; mild, moderate or severe)   - MILD (1-3): doesn't interfere with normal activities    - MODERATE (4-7): interferes with normal activities (e.g., work or school) or awakens from sleep   - SEVERE (8-10): excruciating pain and patient unable to do any normal activities        8/10  4. RECURRENT SYMPTOM: Have you ever had sinus problems before? If Yes, ask: When was the last time? and What happened that time?      Yes, needed antibiotics  5. NASAL CONGESTION: Is the nose blocked? If Yes, ask: Can you open it or must you breathe through your mouth?     yes 6. NASAL DISCHARGE: Do you have discharge from your nose? If so ask, What color?     Thick not discolored 7. FEVER: Do you have a fever? If Yes, ask: What is it, how was it measured, and when did it start?      Denies 8. OTHER SYMPTOMS: Do you have any  other symptoms? (e.g., sore throat, cough, earache, difficulty breathing)     Congestion  Additional info:  1) Refusing appointment, would like Dr. Geofm to call in something to pharmacy as she has in the past without evaluation. Please follow up with Kyra.  2) Needs needles for her insulin-order attached.  Protocols used: Sinus Pain or Congestion-A-AH

## 2023-12-01 NOTE — Telephone Encounter (Signed)
 Already addressed in another CRM.

## 2023-12-01 NOTE — Telephone Encounter (Signed)
 Spoke with patient today and advice given.  She states she did not want to go to ER despite being told that something was going on with her liver and we needed her to do the CT last week before she scheduled for tomorrow. She said she would all to reschedule for tomorrow or Friday if they could get her back in.  States she wants to get her insulin down first despite being told multiple times to go to ED to be checked and worked up.

## 2023-12-01 NOTE — Telephone Encounter (Unsigned)
 Copied from CRM [id number]. Topic: Clinical - Prescription Issue >> Dec 01, 2023  5:15 PM Gennette ORN wrote: Reason for CRM: Steven [phone] needs the frequently and verbal okay stating how often to take her medication. Please reach out to [facility].

## 2023-12-01 NOTE — Telephone Encounter (Signed)
 Copied from CRM [id number]. Topic: Clinical - Prescription Issue >> Dec 01, 2023  1:29 PM Viola F wrote: Reason for CRM: Patient said when she went to pick up the insulin needles the pharmacy told her that they need verification from Dr. Geofm - please call [facility] at [phone]. Patient says she needs to take her insulin today and would like a call back once completed so she can have someone go pick it up.

## 2023-12-02 ENCOUNTER — Other Ambulatory Visit

## 2023-12-02 ENCOUNTER — Ambulatory Visit
Admission: RE | Admit: 2023-12-02 | Discharge: 2023-12-02 | Disposition: A | Discharge status: Home / Self Care | Source: Ambulatory Visit | Attending: Internal Medicine | Admitting: Internal Medicine

## 2023-12-02 ENCOUNTER — Other Ambulatory Visit: Payer: Self-pay

## 2023-12-02 DIAGNOSIS — R933 Abnormal findings on diagnostic imaging of other parts of digestive tract: Secondary | ICD-10-CM

## 2023-12-02 DIAGNOSIS — E1142 Type 2 diabetes mellitus with diabetic polyneuropathy: Secondary | ICD-10-CM

## 2023-12-02 DIAGNOSIS — N2889 Other specified disorders of kidney and ureter: Secondary | ICD-10-CM | POA: Diagnosis not present

## 2023-12-02 DIAGNOSIS — I700 Atherosclerosis of aorta: Secondary | ICD-10-CM | POA: Diagnosis not present

## 2023-12-02 DIAGNOSIS — K8689 Other specified diseases of pancreas: Secondary | ICD-10-CM | POA: Diagnosis not present

## 2023-12-02 MED ORDER — IOPAMIDOL (ISOVUE-300) INJECTION 61%
100.0000 mL | Freq: Once | INTRAVENOUS | Status: AC | PRN
  Administered 2023-12-02: 100 mL via INTRAVENOUS

## 2023-12-02 MED ORDER — INSULIN PEN NEEDLE 32G X 6 MM MISC: 1.0000 | Freq: Every day | 4 refills | Status: DC

## 2023-12-02 NOTE — Telephone Encounter (Signed)
?   RE lantus

## 2023-12-02 NOTE — Telephone Encounter (Signed)
 Copied from CRM [id number]. Topic: Clinical - Prescription Issue >> Dec 02, 2023 10:33 AM Thersia BROCKS wrote: Reason for CRM: Patient called in regarding Insulin Pen Needle 32G X 6 MM MISC , stated it is still saying it is still as directed and the pharmacy needs to know the exact frequency , would like for a nurse to give her a callback regarding this

## 2023-12-02 NOTE — Telephone Encounter (Signed)
 Spoke with pharmacy today and new fax has been updated for pin needles for one a day use.

## 2023-12-02 NOTE — Telephone Encounter (Signed)
 Copied from CRM [id number]. Topic: Clinical - Prescription Issue >> Dec 01, 2023  1:29 PM Viola F wrote: Reason for CRM: Patient said when she went to pick up the insulin needles the pharmacy told her that they need verification from Dr. Geofm - please call [facility] at [phone]. Patient says she needs to take her insulin today and would like a call back once completed so she can have someone go pick it up. >> Dec 01, 2023  4:39 PM Chiquita SQUIBB wrote: Patient is calling in to speak to Tobias, patient states she spoke to the pharmacy and they need a more clear or scripted order to be able to fill the medication.

## 2023-12-02 NOTE — Telephone Encounter (Signed)
 New script was sent this morning to use one pen needle daily.  Message left for patient

## 2023-12-03 ENCOUNTER — Telehealth: Payer: Self-pay | Admitting: Internal Medicine

## 2023-12-03 NOTE — Telephone Encounter (Signed)
Results given to patient today. 

## 2023-12-03 NOTE — Telephone Encounter (Signed)
 Copied from CRM [id number]. Topic: Clinical - Lab/Test Results >> Dec 03, 2023  3:11 PM Mercedes MATSU wrote: Reason for CRM: Patient states that she would like a call back in regards to her MRI results, [phone].

## 2023-12-06 ENCOUNTER — Other Ambulatory Visit (INDEPENDENT_AMBULATORY_CARE_PROVIDER_SITE_OTHER)

## 2023-12-06 ENCOUNTER — Ambulatory Visit: Payer: Self-pay | Admitting: Internal Medicine

## 2023-12-06 DIAGNOSIS — K869 Disease of pancreas, unspecified: Secondary | ICD-10-CM

## 2023-12-06 DIAGNOSIS — N289 Disorder of kidney and ureter, unspecified: Secondary | ICD-10-CM | POA: Diagnosis not present

## 2023-12-06 DIAGNOSIS — M170 Bilateral primary osteoarthritis of knee: Secondary | ICD-10-CM | POA: Diagnosis not present

## 2023-12-06 LAB — COMPREHENSIVE METABOLIC PANEL WITH GFR
ALT: 324 U/L — ABNORMAL HIGH (ref 0–35)
AST: 286 U/L — ABNORMAL HIGH (ref 0–37)
Albumin: 3.4 g/dL — ABNORMAL LOW (ref 3.5–5.2)
Alkaline Phosphatase: 637 U/L — ABNORMAL HIGH (ref 39–117)
BUN: 10 mg/dL (ref 6–23)
CO2: 29 meq/L (ref 19–32)
Calcium: 9.1 mg/dL (ref 8.4–10.5)
Chloride: 94 meq/L — ABNORMAL LOW (ref 96–112)
Creatinine, Ser: 0.93 mg/dL (ref 0.40–1.20)
GFR: 60.54 mL/min (ref >60.00)
Glucose, Bld: 232 mg/dL — ABNORMAL HIGH (ref 70–99)
Potassium: 3 meq/L — ABNORMAL LOW (ref 3.5–5.1)
Sodium: 132 meq/L — ABNORMAL LOW (ref 135–145)
Total Bilirubin: 13.4 mg/dL — ABNORMAL HIGH (ref 0.2–1.2)
Total Protein: 6.9 g/dL (ref 6.0–8.3)

## 2023-12-07 ENCOUNTER — Encounter (HOSPITAL_COMMUNITY): Payer: Self-pay | Admitting: Emergency Medicine

## 2023-12-07 ENCOUNTER — Emergency Department (HOSPITAL_COMMUNITY)

## 2023-12-07 ENCOUNTER — Inpatient Hospital Stay (HOSPITAL_COMMUNITY)
Admission: EM | Admit: 2023-12-07 | Discharge: 2023-12-13 | DRG: 435 | Disposition: A | Discharge status: Home / Self Care | Attending: Internal Medicine | Admitting: Internal Medicine

## 2023-12-07 DIAGNOSIS — Z888 Allergy status to other drugs, medicaments and biological substances status: Secondary | ICD-10-CM

## 2023-12-07 DIAGNOSIS — D630 Anemia in neoplastic disease: Secondary | ICD-10-CM | POA: Diagnosis present

## 2023-12-07 DIAGNOSIS — G8929 Other chronic pain: Secondary | ICD-10-CM | POA: Diagnosis present

## 2023-12-07 DIAGNOSIS — C240 Malignant neoplasm of extrahepatic bile duct: Secondary | ICD-10-CM | POA: Diagnosis not present

## 2023-12-07 DIAGNOSIS — D490 Neoplasm of unspecified behavior of digestive system: Secondary | ICD-10-CM | POA: Diagnosis not present

## 2023-12-07 DIAGNOSIS — Z794 Long term (current) use of insulin: Secondary | ICD-10-CM

## 2023-12-07 DIAGNOSIS — K831 Obstruction of bile duct: Secondary | ICD-10-CM | POA: Diagnosis present

## 2023-12-07 DIAGNOSIS — Z6841 Body Mass Index (BMI) 40.0 and over, adult: Secondary | ICD-10-CM

## 2023-12-07 DIAGNOSIS — I700 Atherosclerosis of aorta: Secondary | ICD-10-CM | POA: Diagnosis not present

## 2023-12-07 DIAGNOSIS — I2510 Atherosclerotic heart disease of native coronary artery without angina pectoris: Secondary | ICD-10-CM | POA: Diagnosis present

## 2023-12-07 DIAGNOSIS — R17 Unspecified jaundice: Secondary | ICD-10-CM | POA: Diagnosis present

## 2023-12-07 DIAGNOSIS — K8689 Other specified diseases of pancreas: Secondary | ICD-10-CM

## 2023-12-07 DIAGNOSIS — R7989 Other specified abnormal findings of blood chemistry: Secondary | ICD-10-CM | POA: Insufficient documentation

## 2023-12-07 DIAGNOSIS — Z87891 Personal history of nicotine dependence: Secondary | ICD-10-CM

## 2023-12-07 DIAGNOSIS — I10 Essential (primary) hypertension: Secondary | ICD-10-CM | POA: Diagnosis present

## 2023-12-07 DIAGNOSIS — F419 Anxiety disorder, unspecified: Secondary | ICD-10-CM | POA: Diagnosis present

## 2023-12-07 DIAGNOSIS — C259 Malignant neoplasm of pancreas, unspecified: Secondary | ICD-10-CM | POA: Diagnosis not present

## 2023-12-07 DIAGNOSIS — M549 Dorsalgia, unspecified: Secondary | ICD-10-CM | POA: Diagnosis present

## 2023-12-07 DIAGNOSIS — J439 Emphysema, unspecified: Secondary | ICD-10-CM | POA: Diagnosis not present

## 2023-12-07 DIAGNOSIS — Z79899 Other long term (current) drug therapy: Secondary | ICD-10-CM | POA: Diagnosis not present

## 2023-12-07 DIAGNOSIS — E871 Hypo-osmolality and hyponatremia: Secondary | ICD-10-CM | POA: Diagnosis present

## 2023-12-07 DIAGNOSIS — K760 Fatty (change of) liver, not elsewhere classified: Secondary | ICD-10-CM | POA: Diagnosis present

## 2023-12-07 DIAGNOSIS — N289 Disorder of kidney and ureter, unspecified: Secondary | ICD-10-CM | POA: Diagnosis not present

## 2023-12-07 DIAGNOSIS — Z8249 Family history of ischemic heart disease and other diseases of the circulatory system: Secondary | ICD-10-CM | POA: Diagnosis not present

## 2023-12-07 DIAGNOSIS — E669 Obesity, unspecified: Secondary | ICD-10-CM | POA: Diagnosis present

## 2023-12-07 DIAGNOSIS — R932 Abnormal findings on diagnostic imaging of liver and biliary tract: Secondary | ICD-10-CM | POA: Diagnosis not present

## 2023-12-07 DIAGNOSIS — R109 Unspecified abdominal pain: Secondary | ICD-10-CM | POA: Diagnosis not present

## 2023-12-07 DIAGNOSIS — C250 Malignant neoplasm of head of pancreas: Secondary | ICD-10-CM | POA: Diagnosis present

## 2023-12-07 DIAGNOSIS — D509 Iron deficiency anemia, unspecified: Secondary | ICD-10-CM | POA: Diagnosis present

## 2023-12-07 DIAGNOSIS — E1165 Type 2 diabetes mellitus with hyperglycemia: Secondary | ICD-10-CM | POA: Diagnosis present

## 2023-12-07 DIAGNOSIS — E785 Hyperlipidemia, unspecified: Secondary | ICD-10-CM | POA: Diagnosis present

## 2023-12-07 DIAGNOSIS — Z7982 Long term (current) use of aspirin: Secondary | ICD-10-CM

## 2023-12-07 DIAGNOSIS — E876 Hypokalemia: Secondary | ICD-10-CM | POA: Diagnosis present

## 2023-12-07 DIAGNOSIS — Z801 Family history of malignant neoplasm of trachea, bronchus and lung: Secondary | ICD-10-CM | POA: Diagnosis not present

## 2023-12-07 DIAGNOSIS — Z8673 Personal history of transient ischemic attack (TIA), and cerebral infarction without residual deficits: Secondary | ICD-10-CM | POA: Diagnosis not present

## 2023-12-07 DIAGNOSIS — Z7984 Long term (current) use of oral hypoglycemic drugs: Secondary | ICD-10-CM | POA: Diagnosis not present

## 2023-12-07 DIAGNOSIS — K838 Other specified diseases of biliary tract: Secondary | ICD-10-CM | POA: Diagnosis not present

## 2023-12-07 DIAGNOSIS — R1900 Intra-abdominal and pelvic swelling, mass and lump, unspecified site: Secondary | ICD-10-CM | POA: Diagnosis not present

## 2023-12-07 LAB — CBC WITH DIFFERENTIAL/PLATELET
Abs Immature Granulocytes: 0.07 10*3/uL (ref 0.00–0.07)
Basophils Absolute: 0 10*3/uL (ref 0.0–0.1)
Basophils Relative: 1 %
Eosinophils Absolute: 0 10*3/uL (ref 0.0–0.5)
Eosinophils Relative: 1 %
HCT: 34.3 % — ABNORMAL LOW (ref 36.0–46.0)
Hemoglobin: 11.2 g/dL — ABNORMAL LOW (ref 12.0–15.0)
Immature Granulocytes: 1 %
Lymphocytes Relative: 18 %
Lymphs Abs: 1.1 10*3/uL (ref 0.7–4.0)
MCH: 24.7 pg — ABNORMAL LOW (ref 26.0–34.0)
MCHC: 32.7 g/dL (ref 30.0–36.0)
MCV: 75.6 fL — ABNORMAL LOW (ref 80.0–100.0)
Monocytes Absolute: 0.5 10*3/uL (ref 0.1–1.0)
Monocytes Relative: 8 %
Neutro Abs: 4.5 10*3/uL (ref 1.7–7.7)
Neutrophils Relative %: 71 %
Platelets: 273 10*3/uL (ref 150–400)
RBC: 4.54 MIL/uL (ref 3.87–5.11)
RDW: 19.4 % — ABNORMAL HIGH (ref 11.5–15.5)
WBC: 6.2 10*3/uL (ref 4.0–10.5)
nRBC: 0 % (ref 0.0–0.2)

## 2023-12-07 LAB — URINALYSIS, ROUTINE W REFLEX MICROSCOPIC
Glucose, UA: 150 mg/dL — AB
Ketones, ur: NEGATIVE mg/dL
Leukocytes,Ua: NEGATIVE
Nitrite: NEGATIVE
Protein, ur: 100 mg/dL — AB
Specific Gravity, Urine: 1.034 — ABNORMAL HIGH (ref 1.005–1.030)
Squamous Epithelial / HPF: >50 /HPF (ref 0–5)
pH: 5 (ref 5.0–8.0)

## 2023-12-07 LAB — COMPREHENSIVE METABOLIC PANEL WITH GFR
ALT: 343 U/L — ABNORMAL HIGH (ref 0–44)
AST: 340 U/L — ABNORMAL HIGH (ref 15–41)
Albumin: 2.7 g/dL — ABNORMAL LOW (ref 3.5–5.0)
Alkaline Phosphatase: 585 U/L — ABNORMAL HIGH (ref 38–126)
Anion gap: 13 (ref 5–15)
BUN: 9 mg/dL (ref 8–23)
CO2: 23 mmol/L (ref 22–32)
Calcium: 8.9 mg/dL (ref 8.9–10.3)
Chloride: 93 mmol/L — ABNORMAL LOW (ref 98–111)
Creatinine, Ser: 0.85 mg/dL (ref 0.44–1.00)
GFR, Estimated: >60 mL/min (ref >60)
Glucose, Bld: 307 mg/dL — ABNORMAL HIGH (ref 70–99)
Potassium: 3.3 mmol/L — ABNORMAL LOW (ref 3.5–5.1)
Sodium: 129 mmol/L — ABNORMAL LOW (ref 135–145)
Total Bilirubin: 15.8 mg/dL — ABNORMAL HIGH (ref 0.0–1.2)
Total Protein: 6.9 g/dL (ref 6.5–8.1)

## 2023-12-07 LAB — APTT: aPTT: 28 s (ref 24–36)

## 2023-12-07 LAB — PROTIME-INR
INR: 1 (ref 0.8–1.2)
Prothrombin Time: 14.2 s (ref 11.4–15.2)

## 2023-12-07 LAB — ACETAMINOPHEN LEVEL: Acetaminophen (Tylenol), Serum: <10 ug/mL — ABNORMAL LOW (ref 10–30)

## 2023-12-07 LAB — LIPASE, BLOOD: Lipase: 111 U/L — ABNORMAL HIGH (ref 11–51)

## 2023-12-07 LAB — HEPATITIS PANEL, ACUTE
HCV Ab: NONREACTIVE
Hep A IgM: NONREACTIVE
Hep B C IgM: NONREACTIVE
Hepatitis B Surface Ag: NONREACTIVE

## 2023-12-07 LAB — BILIRUBIN, DIRECT: Bilirubin, Direct: 10 mg/dL — ABNORMAL HIGH (ref 0.0–0.2)

## 2023-12-07 MED ORDER — GADOBUTROL 1 MMOL/ML IV SOLN
10.0000 mL | Freq: Once | INTRAVENOUS | Status: AC | PRN
  Administered 2023-12-07: 10 mL via INTRAVENOUS

## 2023-12-07 MED ORDER — MORPHINE SULFATE (PF) 4 MG/ML IV SOLN
4.0000 mg | Freq: Once | INTRAVENOUS | Status: AC
  Administered 2023-12-07: 4 mg via INTRAVENOUS
  Filled 2023-12-07: qty 1

## 2023-12-07 MED ORDER — ONDANSETRON HCL 4 MG/2ML IJ SOLN
4.0000 mg | Freq: Once | INTRAMUSCULAR | Status: AC
  Administered 2023-12-07: 4 mg via INTRAVENOUS
  Filled 2023-12-07: qty 2

## 2023-12-07 MED ORDER — IOHEXOL 350 MG/ML SOLN
75.0000 mL | Freq: Once | INTRAVENOUS | Status: AC | PRN
  Administered 2023-12-07: 75 mL via INTRAVENOUS

## 2023-12-07 NOTE — ED Notes (Signed)
Pt went to MRI.

## 2023-12-07 NOTE — ED Provider Triage Note (Signed)
 Emergency Medicine Provider Triage Evaluation Note  Matalie Romberger , a 73 y.o. female  was evaluated in triage.  Pt complains of generalized abdominal pain x 1 month with associated jaundice.  Outpatient lab work: Transaminitis, bili 13, alk phos 600s, lipase 300s  Review of Systems  Positive:  Negative:   Physical Exam  BP 135/89 (BP Location: Left Arm)   Pulse (!) 103   Temp 98.2 F (36.8 C)   Resp 16   Wt 114 kg   SpO2 100%   BMI 40.56 kg/m  Gen:   Awake, no distress   Resp:  Normal effort  MSK:   Moves extremities without difficulty  Other:  Jaundiced, generalized abdominal tenderness, worse epigastric  Medical Decision Making  Medically screening exam initiated at 1:34 PM.  Appropriate orders placed.  Markesia Crilly was informed that the remainder of the evaluation will be completed by another provider, this initial triage assessment does not replace that evaluation, and the importance of remaining in the ED until their evaluation is complete.  Afebrile at this time will obtain CT and ultrasound imaging, repeat outpatient labs   Donnajean Lynwood DEL, PA-C 12/07/23 1335

## 2023-12-07 NOTE — ED Provider Notes (Signed)
 [place] EMERGENCY DEPARTMENT AT [facility] Provider Note   CSN: [id number] Arrival date & time: 12/07/23  1234     Patient presents with: abnormal labs   Lindsay Hardy is a 73 y.o. female.   72 year old female with past medical history of diabetes, coronary artery disease, hypertension, and CVA in the past presenting to the emergency department today with generalized abdominal pain, jaundice, scleral icterus, pale stools, and dark urine now for the past month.  Patient went to see her primary care doctor today and had labs drawn.  Her LFTs were significantly elevated.  They tried to order an outpatient MRI but they were unable to get this scheduled over the next week or 2.  She was sent here for further evaluation due to ongoing symptoms.  Denies any fevers.        Prior to Admission medications   Medication Sig Start Date End Date Taking? Authorizing Provider  Accu-Chek Softclix Lancets lancets USE UP TO FOUR TIMES DAILY AS DIRECTED 10/28/20   Burns, Glade PARAS, MD  albuterol  (VENTOLIN  HFA) 108 (90 Base) MCG/ACT inhaler INHALE 2 PUFFS INTO THE LUNGS EVERY 4 (FOUR) HOURS AS NEEDED FOR SHORTNESS OF BREATH. 03/12/23   Geofm Glade PARAS, MD  amLODipine  (NORVASC ) 10 MG tablet Take 1 tablet (10 mg total) by mouth daily. 03/12/23   Geofm Glade PARAS, MD  aspirin  81 MG EC tablet TAKE 1 TABLET (81 MG TOTAL) BY MOUTH DAILY. 10/29/16   Geofm Glade PARAS, MD  Blood Glucose Monitoring Suppl DEVI UAD to check sugars daily and as needed.  May substitute to any manufacturer covered by patient's insurance. E11.65 04/22/23   Geofm Glade PARAS, MD  fluticasone  (FLONASE ) 50 MCG/ACT nasal spray Place 2 sprays into both nostrils daily. 03/12/23   Geofm Glade PARAS, MD  glipiZIDE  (GLUCOTROL ) 10 MG tablet TAKE 2 TABLETS BY MOUTH 2 TIMES DAILY BEFORE A MEAL. 03/12/23   Geofm Glade PARAS, MD  Glucose Blood (BLOOD GLUCOSE TEST STRIPS) STRP UAD to check sugars daily and as needed.  May substitute to any manufacturer covered by  patient's insurance. E11.65 04/22/23   Geofm Glade PARAS, MD  hydrochlorothiazide  (HYDRODIURIL ) 25 MG tablet Take 1 tablet (25 mg total) by mouth daily. 11/23/23   Geofm Glade PARAS, MD  HYDROcodone -acetaminophen  (NORCO) 10-325 MG tablet SMARTSIG:1 Tablet(s) By Mouth 4-6 Times Daily 07/17/20   [provider]  ibuprofen  (ADVIL ) 800 MG tablet TAKE 1 TABLET BY MOUTH TWICE DAILY AS NEEDED FOR MODERATE PAIN 11/30/23   Geofm Glade PARAS, MD  insulin  glargine (LANTUS ) 100 UNIT/ML Solostar Pen Inject 20 Units into the skin daily. 11/30/23   Geofm Glade PARAS, MD  Insulin  Pen Needle (PEN NEEDLES) 32G X 6 MM MISC Use pen needles as directed to administer insulin  12/01/23   Geofm Glade PARAS, MD  Insulin  Pen Needle 32G X 6 MM MISC UAD for saxenda injections 12/01/23   Burns, Glade PARAS, MD  Insulin  Pen Needle 32G X 6 MM MISC Inject 1 Pen into the skin daily. 12/02/23   Geofm Glade PARAS, MD  Lancet Device MISC UAD to check sugars daily and as needed.  May substitute to any manufacturer covered by patient's insurance. E11.65 07/07/23   Geofm Glade PARAS, MD  Lancets Misc. (ACCU-CHEK SOFTCLIX LANCET DEV) KIT UAD for lancets to check sugars 10/27/23   Geofm Glade PARAS, MD  Lancets Misc. MISC UAD to check sugars daily and as needed.  May substitute to any manufacturer covered by patient's insurance. E11.65  04/22/23   Geofm Glade PARAS, MD  linaclotide  (LINZESS ) 145 MCG CAPS capsule TAKE 1 CAPSULE BY MOUTH DAILY BEFORE BREAKFAST. 03/12/23   Geofm Glade PARAS, MD  metFORMIN  (GLUCOPHAGE -XR) 500 MG 24 hr tablet TAKE 2 TABLETS BY MOUTH TWICE DAILY BEFORE A MEAL 09/23/23   Geofm Glade PARAS, MD  Misc. Devices (BARIATRIC ROLLATOR) MISC Use as directed.  Dx  M54.9 11/18/22   Geofm Glade PARAS, MD  montelukast  (SINGULAIR ) 10 MG tablet TAKE 1 TABLET BY MOUTH EVRY DAY AT BEDTIME 09/23/23   Geofm Glade PARAS, MD  naloxone  (NARCAN ) nasal spray 4 mg/0.1 mL 1 spray in one nostril x1; may repeat dose in alternate nostrils q2-3min prn 03/07/23   Burns, Glade PARAS, MD  potassium  chloride (MICRO-K ) 10 MEQ CR capsule TAKE 1 CAPSULE BY MOUTH TWICE A DAY 06/07/23   Geofm Glade PARAS, MD  RESTASIS 0.05 % ophthalmic emulsion  05/18/20   [provider]    Allergies: Atorvastatin, Clopidogrel bisulfate, Crestor  [rosuvastatin  calcium ], Fexofenadine, Furosemide, Gabapentin , Hydrochlorothiazide , Hydrochlorothiazide -triamterene, Lisinopril, Metoprolol tartrate, Mometasone furoate, Telmisartan , and Ozempic  (0.25 or 0.5 mg-dose) [semaglutide (0.25 or 0.5mg -dos)]    Review of Systems  Gastrointestinal:  Positive for abdominal pain, nausea and vomiting.  All other systems reviewed and are negative.   Updated Vital Signs BP (!) 142/83 (BP Location: Left Arm)   Pulse 79   Temp 98.4 F (36.9 C) (Oral)   Resp 16   Wt 114 kg   SpO2 100%   BMI 40.56 kg/m   Physical Exam Vitals and nursing note reviewed.   Gen: NAD Eyes: PERRL, EOMI, scleral icterus noted HEENT: no oropharyngeal swelling Neck: trachea midline Resp: clear to auscultation bilaterally Card: RRR, no murmurs, rubs, or gallops Abd: Diffusely tender with no guarding or rebound, mildly distended Extremities: no calf tenderness, no edema Vascular: 2+ radial pulses bilaterally, 2+ DP pulses bilaterally Skin: no rashes Psyc: acting appropriately   (all labs ordered are listed, but only abnormal results are displayed) Labs Reviewed  CBC WITH DIFFERENTIAL/PLATELET - Abnormal; Notable for the following components:      Result Value   Hemoglobin 11.2 (*)    HCT 34.3 (*)    MCV 75.6 (*)    MCH 24.7 (*)    RDW 19.4 (*)    All other components within normal limits  COMPREHENSIVE METABOLIC PANEL WITH GFR - Abnormal; Notable for the following components:   Sodium 129 (*)    Potassium 3.3 (*)    Chloride 93 (*)    Glucose, Bld 307 (*)    Albumin 2.7 (*)    AST 340 (*)    ALT 343 (*)    Alkaline Phosphatase 585 (*)    Total Bilirubin 15.8 (*)    All other components within normal limits  LIPASE, BLOOD -  Abnormal; Notable for the following components:   Lipase 111 (*)    All other components within normal limits  URINALYSIS, ROUTINE W REFLEX MICROSCOPIC - Abnormal; Notable for the following components:   Color, Urine AMBER (*)    APPearance CLOUDY (*)    Specific Gravity, Urine 1.034 (*)    Glucose, UA 150 (*)    Hgb urine dipstick MODERATE (*)    Bilirubin Urine MODERATE (*)    Protein, ur 100 (*)    Bacteria, UA MANY (*)    Trichomonas, UA PRESENT (*)    All other components within normal limits  ACETAMINOPHEN  LEVEL - Abnormal; Notable for the following components:   Acetaminophen  (Tylenol ),  Serum <10 (*)    All other components within normal limits  BILIRUBIN, DIRECT - Abnormal; Notable for the following components:   Bilirubin, Direct 10.0 (*)    All other components within normal limits  PROTIME-INR  APTT  HEPATITIS PANEL, ACUTE    EKG: None  Radiology: MR ABDOMEN MRCP W WO CONTAST Result Date: 12/07/2023 CLINICAL DATA:  Abnormal pancreas by prior CT, neoplasm EXAM: MRI ABDOMEN WITHOUT AND WITH CONTRAST (INCLUDING MRCP) TECHNIQUE: Multiplanar multisequence MR imaging of the abdomen was performed both before and after the administration of intravenous contrast. Heavily T2-weighted images of the biliary and pancreatic ducts were obtained, and three-dimensional MRCP images were rendered by post processing. CONTRAST:  10mL GADAVIST GADOBUTROL 1 MMOL/ML IV SOLN COMPARISON:  CT abdomen pelvis, 12/07/2023 FINDINGS: Lower chest: No acute abnormality. Hepatobiliary: No focal liver abnormality is seen. Status post cholecystectomy. Severe intra and extrahepatic biliary ductal dilatation, the common bile duct measuring up to 1.4 cm and abruptly effaced above the level of the ampulla by mass (series 2, image 14). Pancreas: Hypoenhancing mass in the inferior pancreatic head and uncinate measuring 3.4 x 2.3 cm (series 15, image 58). There is abrupt effacement of both the pancreatic and common bile  ducts upstream dilatation pancreatic duct measuring up to 0.8 cm. Cystic change in the adjacent uncinate (series 5, image 31). Spleen: Normal in size without significant abnormality. Adrenals/Urinary Tract: Adrenal glands are unremarkable. Kidneys are normal, without renal calculi, solid lesion, or hydronephrosis. Stomach/Bowel: Stomach is within normal limits. No evidence of bowel wall thickening, distention, or inflammatory changes. Vascular/Lymphatic: Aortic atherosclerosis. Mass closely abuts the central superior mesenteric vein anteriorly with less than 50% contact (series 17, image 58). Fat planes are preserved to the superior mesenteric artery. No enlarged abdominal lymph nodes. Other: No abdominal wall hernia or abnormality. No ascites. Musculoskeletal: No acute or significant osseous findings. IMPRESSION: 1. Hypoenhancing mass in the inferior pancreatic head and uncinate measuring 3.4 x 2.3 cm, consistent with pancreatic adenocarcinoma. 2. There is abrupt effacement of both the pancreatic and common bile ducts by mass with upstream dilatation of the pancreatic duct measuring up to 0.8 cm. 3. Severe intra and extrahepatic biliary ductal dilatation, the common bile duct measuring up to 1.4 cm and as above, abruptly effaced above the level of the ampulla by mass. 4. Mass closely abuts the central superior mesenteric vein anteriorly with less than 50% contact. Fat planes are preserved to the superior mesenteric artery. 5. No evidence of lymphadenopathy or metastatic disease in the abdomen. Electronically Signed   By: Marolyn JONETTA Jaksch M.D.   On: 12/07/2023 18:55   MR 3D Recon At Scanner Result Date: 12/07/2023 CLINICAL DATA:  Abnormal pancreas by prior CT, neoplasm EXAM: MRI ABDOMEN WITHOUT AND WITH CONTRAST (INCLUDING MRCP) TECHNIQUE: Multiplanar multisequence MR imaging of the abdomen was performed both before and after the administration of intravenous contrast. Heavily T2-weighted images of the biliary and  pancreatic ducts were obtained, and three-dimensional MRCP images were rendered by post processing. CONTRAST:  10mL GADAVIST GADOBUTROL 1 MMOL/ML IV SOLN COMPARISON:  CT abdomen pelvis, 12/07/2023 FINDINGS: Lower chest: No acute abnormality. Hepatobiliary: No focal liver abnormality is seen. Status post cholecystectomy. Severe intra and extrahepatic biliary ductal dilatation, the common bile duct measuring up to 1.4 cm and abruptly effaced above the level of the ampulla by mass (series 2, image 14). Pancreas: Hypoenhancing mass in the inferior pancreatic head and uncinate measuring 3.4 x 2.3 cm (series 15, image 58). There is abrupt effacement  of both the pancreatic and common bile ducts upstream dilatation pancreatic duct measuring up to 0.8 cm. Cystic change in the adjacent uncinate (series 5, image 31). Spleen: Normal in size without significant abnormality. Adrenals/Urinary Tract: Adrenal glands are unremarkable. Kidneys are normal, without renal calculi, solid lesion, or hydronephrosis. Stomach/Bowel: Stomach is within normal limits. No evidence of bowel wall thickening, distention, or inflammatory changes. Vascular/Lymphatic: Aortic atherosclerosis. Mass closely abuts the central superior mesenteric vein anteriorly with less than 50% contact (series 17, image 58). Fat planes are preserved to the superior mesenteric artery. No enlarged abdominal lymph nodes. Other: No abdominal wall hernia or abnormality. No ascites. Musculoskeletal: No acute or significant osseous findings. IMPRESSION: 1. Hypoenhancing mass in the inferior pancreatic head and uncinate measuring 3.4 x 2.3 cm, consistent with pancreatic adenocarcinoma. 2. There is abrupt effacement of both the pancreatic and common bile ducts by mass with upstream dilatation of the pancreatic duct measuring up to 0.8 cm. 3. Severe intra and extrahepatic biliary ductal dilatation, the common bile duct measuring up to 1.4 cm and as above, abruptly effaced above  the level of the ampulla by mass. 4. Mass closely abuts the central superior mesenteric vein anteriorly with less than 50% contact. Fat planes are preserved to the superior mesenteric artery. 5. No evidence of lymphadenopathy or metastatic disease in the abdomen. Electronically Signed   By: Marolyn JONETTA Jaksch M.D.   On: 12/07/2023 18:55   CT ABDOMEN PELVIS W CONTRAST Result Date: 12/07/2023 CLINICAL DATA:  Abdominal pain. EXAM: CT ABDOMEN AND PELVIS WITH CONTRAST TECHNIQUE: Multidetector CT imaging of the abdomen and pelvis was performed using the standard protocol following bolus administration of intravenous contrast. RADIATION DOSE REDUCTION: This exam was performed according to the departmental dose-optimization program which includes automated exposure control, adjustment of the mA and/or kV according to patient size and/or use of iterative reconstruction technique. CONTRAST:  75mL OMNIPAQUE  IOHEXOL  350 MG/ML SOLN COMPARISON:  CT abdomen pelvis dated 12/02/2023. FINDINGS: Lower chest: No acute abnormality. There is coronary vascular calcification. No intra-abdominal free air or free fluid. Hepatobiliary: The liver is unremarkable. There is dilatation of the intrahepatic and extrahepatic biliary tree. Cholecystectomy. No retained calcified stone noted in the central CBD. Pancreas: Ill-defined low attenuating area in the region of the uncinate process of the pancreas as seen on the prior CT which may represent pancreatitis but concerning for pancreatic neoplasm. Further evaluation with MRI/MRCP recommended. A small adjacent cyst again noted. There is mild atrophy of the pancreas and mild dilatation of the main pancreatic duct. Spleen: Normal in size without focal abnormality. Adrenals/Urinary Tract: The adrenal glands unremarkable. Mild bilateral renal parenchyma atrophy. There is no hydronephrosis on either side. There is symmetric enhancement and excretion of contrast by both kidneys. Similar appearance of a 1 cm  anterior lower pole hypodense lesion in the left kidney. The visualized ureters and urinary bladder appear unremarkable. Stomach/Bowel: There is no bowel obstruction or active inflammation. The appendix is normal. Vascular/Lymphatic: Mild aortoiliac atherosclerotic disease. The IVC is unremarkable. No portal venous gas. There is no adenopathy. Reproductive: Calcified uterine fibroids. No suspicious adnexal masses. Other: None Musculoskeletal: Degenerative changes of the spine. No acute osseous pathology. IMPRESSION: 1. Ill-defined low attenuating area in the region of the uncinate process of the pancreas as seen on the prior CT may represent pancreatitis but concerning for pancreatic neoplasm. Further evaluation with MRI/MRCP recommended. 2. No bowel obstruction. Normal appendix. 3.  Aortic Atherosclerosis (ICD10-I70.0). Electronically Signed   By: Vanetta Shelia HERO.D.  On: 12/07/2023 15:29   US  Abdomen Limited RUQ (LIVER/GB) Result Date: 12/07/2023 CLINICAL DATA:  Abdominal pain. EXAM: ULTRASOUND ABDOMEN LIMITED RIGHT UPPER QUADRANT COMPARISON:  CT abdomen/pelvis dated 12/02/2023. FINDINGS: Evaluation is limited due to obscuration from overlying bowel gas and soft tissues. Gallbladder: Surgically absent. Common bile duct: Not visualized due to obscuration from overlying bowel gas and soft tissues. Known extrahepatic and intrahepatic biliary ductal dilation noted on the prior CT abdomen/pelvis dated 12/02/2023 is not well visualized on this exam. Liver: No appreciable abnormality. Portal vein is patent on color Doppler imaging with normal direction of blood flow towards the liver. Other: None. IMPRESSION: 1. Limited evaluation due to obscuration from overlying bowel gas and soft tissues. No acute sonographic abnormality identified. 2. Known extrahepatic and intrahepatic biliary ductal dilation noted on the prior CT abdomen/pelvis dated 12/02/2023 is not well visualized on this exam. Electronically Signed   By:  Harrietta Sherry M.D.   On: 12/07/2023 14:51     Procedures   Medications Ordered in the ED  iohexol  (OMNIPAQUE ) 350 MG/ML injection 75 mL (75 mLs Intravenous Contrast Given 12/07/23 1508)  morphine (PF) 4 MG/ML injection 4 mg (4 mg Intravenous Given 12/07/23 1612)  ondansetron  (ZOFRAN ) injection 4 mg (4 mg Intravenous Given 12/07/23 1612)  gadobutrol (GADAVIST) 1 MMOL/ML injection 10 mL (10 mLs Intravenous Contrast Given 12/07/23 1826)                                    Medical Decision Making 73 year old female with past medical history of diabetes, coronary artery disease, and CVA in the past presenting to the emergency department today with persistent abdominal pain now with scleral icterus and elevated LFTs.  Her CT scan ordered at triage does show findings concerning for possible neoplasm versus pancreatitis.  MRCP is ordered.  Will give the patient morphine Zofran  for her symptoms.    I called and discussed the patient's case with Dr. Cloretta after her MRCP shows findings concerning for new pancreatic lesion.  Recommends GI consultation.  I did discuss her case with Dr. Abran.  Plan is for ERCP tomorrow.  A call was placed to hospitalist service for admission.  Amount and/or Complexity of Data Reviewed Radiology: ordered.  Risk Prescription drug management.        Final diagnoses:  Malignant neoplasm of pancreas, unspecified location of malignancy [facility])  Obstructive jaundice    ED Discharge Orders     None          Ula Prentice SAUNDERS, MD 12/07/23 2119

## 2023-12-07 NOTE — ED Triage Notes (Signed)
 Pt states PCP sent her here for elevated liver enzymes.  Pt is jaundice.  C/o generalized abd pain x 1 month.

## 2023-12-07 NOTE — ED Notes (Addendum)
 Pt returned from MRI, requesting food, resting comfortably

## 2023-12-07 NOTE — ED Notes (Signed)
 Attempted to draw labs, asked lab to come try

## 2023-12-08 ENCOUNTER — Other Ambulatory Visit: Payer: Self-pay

## 2023-12-08 ENCOUNTER — Inpatient Hospital Stay (HOSPITAL_COMMUNITY)

## 2023-12-08 ENCOUNTER — Inpatient Hospital Stay (HOSPITAL_COMMUNITY): Admitting: Anesthesiology

## 2023-12-08 ENCOUNTER — Encounter (HOSPITAL_COMMUNITY)
Admission: EM | Disposition: A | Discharge status: Home / Self Care | Payer: Self-pay | Source: Home / Self Care | Attending: Internal Medicine

## 2023-12-08 ENCOUNTER — Encounter (HOSPITAL_COMMUNITY): Payer: Self-pay | Admitting: Internal Medicine

## 2023-12-08 DIAGNOSIS — R17 Unspecified jaundice: Secondary | ICD-10-CM | POA: Diagnosis not present

## 2023-12-08 DIAGNOSIS — I10 Essential (primary) hypertension: Secondary | ICD-10-CM

## 2023-12-08 DIAGNOSIS — D509 Iron deficiency anemia, unspecified: Secondary | ICD-10-CM

## 2023-12-08 DIAGNOSIS — D490 Neoplasm of unspecified behavior of digestive system: Secondary | ICD-10-CM | POA: Diagnosis not present

## 2023-12-08 DIAGNOSIS — I2510 Atherosclerotic heart disease of native coronary artery without angina pectoris: Secondary | ICD-10-CM

## 2023-12-08 DIAGNOSIS — K831 Obstruction of bile duct: Secondary | ICD-10-CM

## 2023-12-08 DIAGNOSIS — K838 Other specified diseases of biliary tract: Secondary | ICD-10-CM | POA: Diagnosis not present

## 2023-12-08 DIAGNOSIS — K8689 Other specified diseases of pancreas: Secondary | ICD-10-CM

## 2023-12-08 DIAGNOSIS — E876 Hypokalemia: Secondary | ICD-10-CM

## 2023-12-08 DIAGNOSIS — R7989 Other specified abnormal findings of blood chemistry: Secondary | ICD-10-CM

## 2023-12-08 DIAGNOSIS — E1165 Type 2 diabetes mellitus with hyperglycemia: Secondary | ICD-10-CM | POA: Insufficient documentation

## 2023-12-08 DIAGNOSIS — Z87891 Personal history of nicotine dependence: Secondary | ICD-10-CM | POA: Diagnosis not present

## 2023-12-08 DIAGNOSIS — Z794 Long term (current) use of insulin: Secondary | ICD-10-CM

## 2023-12-08 LAB — BASIC METABOLIC PANEL WITH GFR
Anion gap: 10 (ref 5–15)
BUN: 7 mg/dL — ABNORMAL LOW (ref 8–23)
CO2: 25 mmol/L (ref 22–32)
Calcium: 8.5 mg/dL — ABNORMAL LOW (ref 8.9–10.3)
Chloride: 98 mmol/L (ref 98–111)
Creatinine, Ser: 0.81 mg/dL (ref 0.44–1.00)
GFR, Estimated: >60 mL/min (ref >60)
Glucose, Bld: 269 mg/dL — ABNORMAL HIGH (ref 70–99)
Potassium: 3 mmol/L — ABNORMAL LOW (ref 3.5–5.1)
Sodium: 133 mmol/L — ABNORMAL LOW (ref 135–145)

## 2023-12-08 LAB — CBC
HCT: 31 % — ABNORMAL LOW (ref 36.0–46.0)
Hemoglobin: 10.4 g/dL — ABNORMAL LOW (ref 12.0–15.0)
MCH: 24.9 pg — ABNORMAL LOW (ref 26.0–34.0)
MCHC: 33.5 g/dL (ref 30.0–36.0)
MCV: 74.3 fL — ABNORMAL LOW (ref 80.0–100.0)
Platelets: 266 10*3/uL (ref 150–400)
RBC: 4.17 MIL/uL (ref 3.87–5.11)
RDW: 19.5 % — ABNORMAL HIGH (ref 11.5–15.5)
WBC: 6.4 10*3/uL (ref 4.0–10.5)
nRBC: 0 % (ref 0.0–0.2)

## 2023-12-08 LAB — HEPATIC FUNCTION PANEL
ALT: 301 U/L — ABNORMAL HIGH (ref 0–44)
AST: 271 U/L — ABNORMAL HIGH (ref 15–41)
Albumin: 2.3 g/dL — ABNORMAL LOW (ref 3.5–5.0)
Alkaline Phosphatase: 518 U/L — ABNORMAL HIGH (ref 38–126)
Bilirubin, Direct: 8.9 mg/dL — ABNORMAL HIGH (ref 0.0–0.2)
Indirect Bilirubin: 5.3 mg/dL — ABNORMAL HIGH (ref 0.3–0.9)
Total Bilirubin: 14.2 mg/dL — ABNORMAL HIGH (ref 0.0–1.2)
Total Protein: 6.1 g/dL — ABNORMAL LOW (ref 6.5–8.1)

## 2023-12-08 LAB — GLUCOSE, CAPILLARY
Glucose-Capillary: 149 mg/dL — ABNORMAL HIGH (ref 70–99)
Glucose-Capillary: 172 mg/dL — ABNORMAL HIGH (ref 70–99)
Glucose-Capillary: 206 mg/dL — ABNORMAL HIGH (ref 70–99)
Glucose-Capillary: 246 mg/dL — ABNORMAL HIGH (ref 70–99)
Glucose-Capillary: 253 mg/dL — ABNORMAL HIGH (ref 70–99)
Glucose-Capillary: 273 mg/dL — ABNORMAL HIGH (ref 70–99)
Glucose-Capillary: 320 mg/dL — ABNORMAL HIGH (ref 70–99)
Glucose-Capillary: 345 mg/dL — ABNORMAL HIGH (ref 70–99)

## 2023-12-08 SURGERY — ERCP, WITH INTERVENTION IF INDICATED
Anesthesia: General

## 2023-12-08 MED ORDER — GLUCAGON HCL RDNA (DIAGNOSTIC) 1 MG IJ SOLR
INTRAMUSCULAR | Status: AC
  Filled 2023-12-08: qty 2

## 2023-12-08 MED ORDER — FENTANYL CITRATE (PF) 100 MCG/2ML IJ SOLN
25.0000 ug | INTRAMUSCULAR | Status: DC | PRN
  Administered 2023-12-08: 25 ug via INTRAVENOUS

## 2023-12-08 MED ORDER — MELATONIN 5 MG PO TABS: 5.0000 mg | ORAL_TABLET | Freq: Every evening | ORAL | Status: DC | PRN

## 2023-12-08 MED ORDER — POTASSIUM CHLORIDE 10 MEQ/100ML IV SOLN
10.0000 meq | INTRAVENOUS | Status: AC
  Administered 2023-12-08 (×2): 10 meq via INTRAVENOUS
  Filled 2023-12-08: qty 100

## 2023-12-08 MED ORDER — HYDROMORPHONE HCL 1 MG/ML IJ SOLN
0.5000 mg | INTRAMUSCULAR | Status: DC | PRN
  Administered 2023-12-08 – 2023-12-10 (×9): 0.5 mg via INTRAVENOUS
  Filled 2023-12-08 (×11): qty 1

## 2023-12-08 MED ORDER — CIPROFLOXACIN IN D5W 400 MG/200ML IV SOLN
INTRAVENOUS | Status: AC
  Filled 2023-12-08: qty 200

## 2023-12-08 MED ORDER — OXYCODONE HCL 5 MG PO TABS
5.0000 mg | ORAL_TABLET | ORAL | Status: DC | PRN
  Administered 2023-12-08 – 2023-12-10 (×6): 5 mg via ORAL
  Filled 2023-12-08 (×6): qty 1

## 2023-12-08 MED ORDER — ROCURONIUM BROMIDE 10 MG/ML (PF) SYRINGE
PREFILLED_SYRINGE | INTRAVENOUS | Status: DC | PRN
  Administered 2023-12-08: 50 mg via INTRAVENOUS

## 2023-12-08 MED ORDER — ACETAMINOPHEN 325 MG PO TABS
650.0000 mg | ORAL_TABLET | Freq: Four times a day (QID) | ORAL | Status: DC | PRN
  Filled 2023-12-08 (×2): qty 2

## 2023-12-08 MED ORDER — DICLOFENAC SUPPOSITORY 100 MG: 100.0000 mg | Freq: Once | RECTAL | Status: DC

## 2023-12-08 MED ORDER — FENTANYL CITRATE (PF) 100 MCG/2ML IJ SOLN: 25.0000 ug | INTRAMUSCULAR | Status: DC | PRN

## 2023-12-08 MED ORDER — INSULIN ASPART 100 UNIT/ML IJ SOLN
0.0000 [IU] | Freq: Three times a day (TID) | INTRAMUSCULAR | Status: DC
  Administered 2023-12-08: 7 [IU] via SUBCUTANEOUS
  Administered 2023-12-09: 5 [IU] via SUBCUTANEOUS
  Administered 2023-12-09: 9 [IU] via SUBCUTANEOUS
  Administered 2023-12-09: 7 [IU] via SUBCUTANEOUS
  Administered 2023-12-09 – 2023-12-10 (×2): 5 [IU] via SUBCUTANEOUS
  Administered 2023-12-10 (×3): 9 [IU] via SUBCUTANEOUS

## 2023-12-08 MED ORDER — DICLOFENAC SUPPOSITORY 100 MG
RECTAL | Status: AC
  Filled 2023-12-08: qty 1

## 2023-12-08 MED ORDER — SUGAMMADEX SODIUM 200 MG/2ML IV SOLN
INTRAVENOUS | Status: DC | PRN
  Administered 2023-12-08: 230 mg via INTRAVENOUS

## 2023-12-08 MED ORDER — HYDROMORPHONE HCL 1 MG/ML IJ SOLN
0.2000 mg | INTRAMUSCULAR | Status: DC | PRN
  Administered 2023-12-08 (×3): 0.2 mg via INTRAVENOUS
  Filled 2023-12-08 (×3): qty 1

## 2023-12-08 MED ORDER — AMLODIPINE BESYLATE 10 MG PO TABS
10.0000 mg | ORAL_TABLET | Freq: Every day | ORAL | Status: DC
  Administered 2023-12-08 – 2023-12-13 (×6): 10 mg via ORAL
  Filled 2023-12-08 (×6): qty 1

## 2023-12-08 MED ORDER — PROCHLORPERAZINE EDISYLATE 10 MG/2ML IJ SOLN
5.0000 mg | Freq: Four times a day (QID) | INTRAMUSCULAR | Status: DC | PRN
  Administered 2023-12-08 – 2023-12-10 (×2): 5 mg via INTRAVENOUS
  Filled 2023-12-08 (×2): qty 2

## 2023-12-08 MED ORDER — LIDOCAINE 2% (20 MG/ML) 5 ML SYRINGE
INTRAMUSCULAR | Status: DC | PRN
  Administered 2023-12-08: 60 mg via INTRAVENOUS

## 2023-12-08 MED ORDER — INSULIN ASPART 100 UNIT/ML IJ SOLN
0.0000 [IU] | INTRAMUSCULAR | Status: DC
  Administered 2023-12-08 (×2): 3 [IU] via SUBCUTANEOUS
  Administered 2023-12-08: 5 [IU] via SUBCUTANEOUS

## 2023-12-08 MED ORDER — HYDROMORPHONE HCL 1 MG/ML IJ SOLN: 0.5000 mg | INTRAMUSCULAR | Status: DC | PRN

## 2023-12-08 MED ORDER — POLYETHYLENE GLYCOL 3350 17 G PO PACK
17.0000 g | PACK | Freq: Every day | ORAL | Status: DC | PRN
  Administered 2023-12-11: 17 g via ORAL
  Filled 2023-12-08 (×2): qty 1

## 2023-12-08 MED ORDER — DEXAMETHASONE SODIUM PHOSPHATE 10 MG/ML IJ SOLN
INTRAMUSCULAR | Status: DC | PRN
Start: 2023-12-08 — End: 2023-12-08
  Administered 2023-12-08: 5 mg via INTRAVENOUS

## 2023-12-08 MED ORDER — FENTANYL CITRATE (PF) 100 MCG/2ML IJ SOLN
INTRAMUSCULAR | Status: DC | PRN
  Administered 2023-12-08 (×2): 50 ug via INTRAVENOUS

## 2023-12-08 MED ORDER — SODIUM CHLORIDE 0.9 % IV SOLN
1.5000 g | Freq: Once | INTRAVENOUS | Status: AC
  Administered 2023-12-08: 1.5 g via INTRAVENOUS
  Filled 2023-12-08: qty 4

## 2023-12-08 MED ORDER — LACTATED RINGERS IV SOLN
INTRAVENOUS | Status: AC | PRN
  Administered 2023-12-08: 1000 mL via INTRAVENOUS

## 2023-12-08 MED ORDER — PROPOFOL 10 MG/ML IV BOLUS
INTRAVENOUS | Status: DC | PRN
  Administered 2023-12-08: 150 mg via INTRAVENOUS

## 2023-12-08 MED ORDER — ONDANSETRON HCL 4 MG/2ML IJ SOLN
INTRAMUSCULAR | Status: DC | PRN
  Administered 2023-12-08: 4 mg via INTRAVENOUS

## 2023-12-08 MED ORDER — FENTANYL CITRATE (PF) 100 MCG/2ML IJ SOLN
INTRAMUSCULAR | Status: AC
  Filled 2023-12-08: qty 2

## 2023-12-08 NOTE — Hospital Course (Addendum)
 Lindsay Hardy is a 73 y.o. female with PMH DMII, HTN who presented with ongoing abdominal pain.  She has had associated nausea as well.  She was undergoing evaluation outpatient with primary care with upcoming MRCP however due to persistent abdominal pain, she presented to the ER. Prior CT abdomen/pelvis from 10/28/2023 showed ill-defined hypoattenuation involving the pancreas.  This was again seen on repeat CT A/P on 6/26 along with biliary ductal dilatation noted on both scans. Ultimately, MRCP was performed on admission showing hypoenhancing mass involving inferior pancreatic head and uncinate process measuring 3.4 x 2.3 cm concerning for pancreatic adenocarcinoma.  There was also effacement of the pancreatic and CBD due to the mass with dilation of the pancreatic duct up to 8 mm.  CBD was dilated to 1.4 cm. GI was consulted on admission.  She underwent ERCP on 12/08/2023 and underwent biliary sphincterotomy along with placement of metal biliary stent into the CBD. Pathology returned consistent with pancreatic adenocarcinoma. Follow-up as scheduled with oncology at discharge with tentative plans for surgery referral regarding discussion of resection/Whipple procedure.  Palliative care referral also placed for symptom management.  Patient typically follows with pain management for chronic back pain however in setting of abdominal pain with new diagnosis, she may be better suited for continuing care with palliative care for pain management along with some anxiety she is also beginning to experience regarding diagnosis.

## 2023-12-08 NOTE — Progress Notes (Signed)
 Lindsay Hardy 10:32 AM  Subjective: Patient seen and examined and discussed with Dr. Leigh and her hospital computer chart reviewed and she has been sick at home for about a month and other than her gallbladder being out has no other previous surgeries on her abdomen and we rediscussed the procedure  Objective: Vital signs stable afebrile no acute distress exam please see preassessment evaluation labs and x-rays reviewed  Assessment: Obstructive jaundice pancreatic mass  Plan: Risk benefits methods and success rate was thoroughly discussed with the patient including tissue sample and stenting and we will proceed this morning with anesthesia assistance  [facility]  office [phone] After 5PM or if no answer call [phone]

## 2023-12-08 NOTE — Anesthesia Procedure Notes (Signed)
 Procedure Name: Intubation Date/Time: 12/08/2023 11:03 AM  Performed by: Worth Catherene Flores, CRNAPre-anesthesia Checklist: Patient identified, Emergency Drugs available, Suction available and Patient being monitored Patient Re-evaluated:Patient Re-evaluated prior to induction Oxygen  Delivery Method: Circle system utilized Preoxygenation: Pre-oxygenation with 100% oxygen  Induction Type: IV induction Ventilation: Mask ventilation without difficulty Laryngoscope Size: Mac and 4 Tube type: Oral Tube size: 7.0 mm Number of attempts: 1 Airway Equipment and Method: Stylet and Oral airway Placement Confirmation: ETT inserted through vocal cords under direct vision, positive ETCO2 and breath sounds checked- equal and bilateral Tube secured with: Tape Dental Injury: Teeth and Oropharynx as per pre-operative assessment

## 2023-12-08 NOTE — Plan of Care (Signed)

## 2023-12-08 NOTE — Assessment & Plan Note (Signed)
-   Presumed in setting of underlying pancreatic malignancy - Check iron studies, B12, folate

## 2023-12-08 NOTE — ED Notes (Addendum)
 Care handoff completed to Sari, RN for 2w room 33

## 2023-12-08 NOTE — Assessment & Plan Note (Addendum)
-   Initially noted on CT on 10/28/2023 but further imaging was necessary -Follow-up images have been performed since and ultimately she was to undergo MRI abdomen outpatient but presented due to persistent pain -MRCP notes 3.4 x 2.3 cm pancreatic mass concerning for pancreatic adenocarcinoma.  There is also associated pancreatic duct and CBD dilatation - now s/p ERCP with GI on 7/2 with sphincterotomy and CBD stent placement - tolerating liquids post ERCP; diet advanced more today per patient request - Pathology returned consistent with adenocarcinoma; discussed with patient 12/10/23 -Staging CT chest negative for evidence of metastatic disease - Outpatient follow-up planned with Dr. Cloretta and/or for referral to surgery - Still having uncontrolled pain, 10/10 today; regimen adjusted.  Monitor overnight to see tolerance and control

## 2023-12-08 NOTE — Assessment & Plan Note (Addendum)
-   A1c 10.1% on 11/23/2023 -Continue SSI and CBG monitoring for now -At home on glipizide , Lantus  - CBGs elevating; will resume basal insulin  and modify sliding scale - Okay for continuing regular diet

## 2023-12-08 NOTE — H&P (Signed)
 History and Physical    Lindsay Hardy FMW:[id number] DOB: [birth year] DOA: 12/07/2023  Patient coming from: Home.  Chief Complaint: Abdominal pain.  HPI: Lindsay Hardy is a 73 y.o. female with history of diabetes mellitus type 2, hypertension has been experiencing abdominal pain with some nausea for the last 1 month.  Patient's labs have recently showed elevated LFTs with increased bilirubin.  Plan is to have MRCP as outpatient but since due to patient's abdominal pain was getting worse patient decided to come to the ER.  Patient has been having some nausea and has only vomited once.  Has been having some weight loss.  Patient also was recently placed on Lantus  for uncontrolled diabetes.  Denies drinking alcohol .  ED Course: In the ER patient's labs show total bilirubin of 15.8 and elevated AST and ALT and lipase.  MRCP shows pancreatic mass concerning for pancreatic adenocarcinoma with obstruction and on-call gastroenterologist Dr. Abran was consulted and patient admitted for further workup.  Labs also show hyponatremia hypokalemia.  Review of Systems: As per HPI, rest all negative.   Past Medical History:  Diagnosis Date   Asthma    CAD (coronary artery disease) 11/2003    RCA 60% stenosis, LAD diffuse   Chicken pox    CVA (cerebrovascular accident) (HCC) 2001    right frontoparietal cortical CVA - MRI September 2001   Diabetes mellitus    Diastolic dysfunction     ejection fraction 55-65% on 2-D echo May 2006   Empty sella syndrome [facility])     based on MRI September 2001, related to obesity, hypertension, it was determined that patient has not required treatment but will be monitored with TSH, ACTH, cortisol, testosterone, prolactin, growth hormone   GERD (gastroesophageal reflux disease)     EGD June 2004 -  followed by Dr. Avram   Granulomatous disease, chronic (HCC)     in right upper lobe per x-ray December 2005   Hyperlipidemia    Hypertension    NAFLD (nonalcoholic fatty  liver disease)     based on ultrasound June 2002, elevated transaminase   OA (osteoarthritis)     DJP coracoclavicular ligament, left patella osteophytes June 2002, L4-S1 spondylosis, degenerative disc disease with bulge. I in October 2001, the C2-C4 spondylosis without stenosis and with spurs based on x-ray July 2005; diffuse idiopathic skeletal hyperostosis with bilateral hip lumbar degenerative changes   on x-ray February 2005, bilateral plantar calcaneal spurs   Reactive airway disease     peak flow 180 in May 2005, chronic sinusitis with PND, bronchitis December 2002 and strep pneumonia April 2007,  FEC 66, MCV 60, ratio is 69, DLCO 57,  moderate restrictive and mild obstructive disease  on spirometry May 2005    Past Surgical History:  Procedure Laterality Date   CARPAL TUNNEL RELEASE  1992    left arm 1992   CHOLECYSTECTOMY  2003   TONSILLECTOMY AND ADENOIDECTOMY       reports that she has quit smoking. Her smoking use included cigarettes. She has a 1 pack-year smoking history. She has never used smokeless tobacco. She reports that she does not drink alcohol  and does not use drugs.  Allergies  Allergen Reactions   Atorvastatin     REACTION: unable to tolerate. reason unknown   Clopidogrel Bisulfate     REACTION: weak, tired, sleepy, tightness (6/05); soreness in hip (11/05)   Crestor  [Rosuvastatin  Calcium ]     Did not like how she felt on it - stomach upset, nausea  Fexofenadine     REACTION: intolerance unknown   Furosemide     REACTION: did not tolerate and preferred HCTZ   Gabapentin      sob   Hydrochlorothiazide -Triamterene     REACTION: things crawled in her head and felt funny(10/04) ; h/a (3/07)   Lisinopril     REACTION: cough, right sided pain   Metoprolol Tartrate     REACTION: lightheadedness, h/a cough (7/04); heart gong to stop (7/06); sluggishnes and depressed feeling (2/07); weakness/heaviness 6/07)   Mometasone Furoate     REACTION: neck tenderness    Telmisartan      Shortness of breath, foot swelling   Ozempic  (0.25 Or 0.5 Mg-Dose) [Semaglutide (0.25 Or 0.5mg -Dos)]     Blurry vision    Family History  Problem Relation Age of Onset   Lung cancer Mother    Hypertension Mother    Lung cancer Sister    Hypertension Sister     Prior to Admission medications   Medication Sig Start Date End Date Taking? Authorizing Provider  albuterol  (VENTOLIN  HFA) 108 (90 Base) MCG/ACT inhaler INHALE 2 PUFFS INTO THE LUNGS EVERY 4 (FOUR) HOURS AS NEEDED FOR SHORTNESS OF BREATH. 03/12/23  Yes Burns, Glade PARAS, MD  amLODipine  (NORVASC ) 10 MG tablet Take 1 tablet (10 mg total) by mouth daily. 03/12/23  Yes Burns, Glade PARAS, MD  cyanocobalamin  (VITAMIN B12) 500 MCG tablet Take 500 mcg by mouth daily.   Yes [provider]  fluticasone  (FLONASE ) 50 MCG/ACT nasal spray Place 2 sprays into both nostrils daily. 03/12/23  Yes Burns, Glade PARAS, MD  glipiZIDE  (GLUCOTROL ) 10 MG tablet TAKE 2 TABLETS BY MOUTH 2 TIMES DAILY BEFORE A MEAL. Patient taking differently: Take 10 mg by mouth 2 (two) times daily before a meal. TAKE 2 TABLETS BY MOUTH 2 TIMES DAILY BEFORE A MEAL. 03/12/23  Yes Burns, Glade PARAS, MD  hydrochlorothiazide  (HYDRODIURIL ) 25 MG tablet Take 1 tablet (25 mg total) by mouth daily. 11/23/23  Yes Burns, Glade PARAS, MD  HYDROcodone -acetaminophen  (NORCO) 10-325 MG tablet Take 1 tablet by mouth every 4 (four) hours as needed for moderate pain (pain score 4-6) or severe pain (pain score 7-10). 11/30/23 12/30/23 Yes [provider]  ibuprofen  (ADVIL ) 800 MG tablet TAKE 1 TABLET BY MOUTH TWICE DAILY AS NEEDED FOR MODERATE PAIN 11/30/23  Yes Burns, Glade PARAS, MD  insulin  glargine (LANTUS ) 100 UNIT/ML Solostar Pen Inject 20 Units into the skin daily. 11/30/23  Yes Burns, Glade PARAS, MD  linaclotide  (LINZESS ) 145 MCG CAPS capsule TAKE 1 CAPSULE BY MOUTH DAILY BEFORE BREAKFAST. Patient not taking: Reported on 12/07/2023 03/12/23   Geofm Glade PARAS, MD  metFORMIN  (GLUCOPHAGE -XR)  500 MG 24 hr tablet TAKE 2 TABLETS BY MOUTH TWICE DAILY BEFORE A MEAL Patient taking differently: Take 500 mg by mouth 2 (two) times daily with a meal. TAKE 2 TABLETS BY MOUTH TWICE DAILY BEFORE A MEAL 09/23/23   Burns, Glade PARAS, MD  montelukast  (SINGULAIR ) 10 MG tablet TAKE 1 TABLET BY MOUTH EVRY DAY AT BEDTIME Patient not taking: Reported on 12/07/2023 09/23/23   Geofm Glade PARAS, MD  naloxone  (NARCAN ) nasal spray 4 mg/0.1 mL 1 spray in one nostril x1; may repeat dose in alternate nostrils q2-3min prn 03/07/23   Geofm Glade PARAS, MD  potassium chloride  (MICRO-K ) 10 MEQ CR capsule TAKE 1 CAPSULE BY MOUTH TWICE A DAY Patient taking differently: Take 10 mEq by mouth as needed. 06/07/23   Geofm Glade PARAS, MD    Physical Exam: Constitutional:  Moderately built and nourished. Vitals:   12/07/23 1317 12/07/23 1651 12/07/23 2058 12/08/23 0111  BP:  135/76 (!) 142/83   Pulse:  89 79   Resp:  18 16   Temp:  98 F (36.7 C) 98.4 F (36.9 C) 98.5 F (36.9 C)  TempSrc:  Oral Oral Oral  SpO2:  100% 100%   Weight: 114 kg      Eyes: Icterus present no pallor. ENMT: No discharge from the ears eyes nose and mouth. Neck: No mass felt.  No neck rigidity. Respiratory: No rhonchi or crepitations. Cardiovascular: S1-S2 heard. Abdomen: Soft epigastric tenderness present. Musculoskeletal: No edema. Skin: No rash. Neurologic: Alert awake oriented time place and person.  Moves all extremities. Psychiatric: Appears normal.  Normal affect.   Labs on Admission: I have personally reviewed following labs and imaging studies  CBC: Recent Labs  Lab 12/07/23 1334  WBC 6.2  NEUTROABS 4.5  HGB 11.2*  HCT 34.3*  MCV 75.6*  PLT 273   Basic Metabolic Panel: Recent Labs  Lab 12/06/23 1219 12/07/23 1334  NA 132* 129*  K 3.0* 3.3*  CL 94* 93*  CO2 29 23  GLUCOSE 232* 307*  BUN 10 9  CREATININE 0.93 0.85  CALCIUM  9.1 8.9   GFR: Estimated Creatinine Clearance: 74.4 mL/min (by C-G formula based on SCr of 0.85  mg/dL). Liver Function Tests: Recent Labs  Lab 12/06/23 1219 12/07/23 1334  AST 286* 340*  ALT 324* 343*  ALKPHOS 637* 585*  BILITOT 13.4* 15.8*  PROT 6.9 6.9  ALBUMIN 3.4* 2.7*   Recent Labs  Lab 12/07/23 1334  LIPASE 111*   No results for input(s): AMMONIA in the last 168 hours. Coagulation Profile: Recent Labs  Lab 12/07/23 1334  INR 1.0   Cardiac Enzymes: No results for input(s): CKTOTAL, CKMB, CKMBINDEX, TROPONINI in the last 168 hours. BNP (last 3 results) No results for input(s): PROBNP in the last 8760 hours. HbA1C: No results for input(s): HGBA1C in the last 72 hours. CBG: No results for input(s): GLUCAP in the last 168 hours. Lipid Profile: No results for input(s): CHOL, HDL, LDLCALC, TRIG, CHOLHDL, LDLDIRECT in the last 72 hours. Thyroid  Function Tests: No results for input(s): TSH, T4TOTAL, FREET4, T3FREE, THYROIDAB in the last 72 hours. Anemia Panel: No results for input(s): VITAMINB12, FOLATE, FERRITIN, TIBC, IRON, RETICCTPCT in the last 72 hours. Urine analysis:    Component Value Date/Time   COLORURINE AMBER (A) 12/07/2023 1359   APPEARANCEUR CLOUDY (A) 12/07/2023 1359   LABSPEC 1.034 (H) 12/07/2023 1359   PHURINE 5.0 12/07/2023 1359   GLUCOSEU 150 (A) 12/07/2023 1359   GLUCOSEU NEGATIVE 10/27/2023 1427   HGBUR MODERATE (A) 12/07/2023 1359   BILIRUBINUR MODERATE (A) 12/07/2023 1359   BILIRUBINUR negative 03/23/2023 1651   KETONESUR NEGATIVE 12/07/2023 1359   PROTEINUR 100 (A) 12/07/2023 1359   UROBILINOGEN 1.0 10/27/2023 1427   NITRITE NEGATIVE 12/07/2023 1359   LEUKOCYTESUR NEGATIVE 12/07/2023 1359   Sepsis Labs: @LABRCNTIP (procalcitonin:4,lacticidven:4) )No results found for this or any previous visit (from the past 240 hours).   Radiological Exams on Admission: MR ABDOMEN MRCP W WO CONTAST Result Date: 12/07/2023 CLINICAL DATA:  Abnormal pancreas by prior CT, neoplasm EXAM: MRI ABDOMEN  WITHOUT AND WITH CONTRAST (INCLUDING MRCP) TECHNIQUE: Multiplanar multisequence MR imaging of the abdomen was performed both before and after the administration of intravenous contrast. Heavily T2-weighted images of the biliary and pancreatic ducts were obtained, and three-dimensional MRCP images were rendered by post processing. CONTRAST:  10mL  GADAVIST GADOBUTROL 1 MMOL/ML IV SOLN COMPARISON:  CT abdomen pelvis, 12/07/2023 FINDINGS: Lower chest: No acute abnormality. Hepatobiliary: No focal liver abnormality is seen. Status post cholecystectomy. Severe intra and extrahepatic biliary ductal dilatation, the common bile duct measuring up to 1.4 cm and abruptly effaced above the level of the ampulla by mass (series 2, image 14). Pancreas: Hypoenhancing mass in the inferior pancreatic head and uncinate measuring 3.4 x 2.3 cm (series 15, image 58). There is abrupt effacement of both the pancreatic and common bile ducts upstream dilatation pancreatic duct measuring up to 0.8 cm. Cystic change in the adjacent uncinate (series 5, image 31). Spleen: Normal in size without significant abnormality. Adrenals/Urinary Tract: Adrenal glands are unremarkable. Kidneys are normal, without renal calculi, solid lesion, or hydronephrosis. Stomach/Bowel: Stomach is within normal limits. No evidence of bowel wall thickening, distention, or inflammatory changes. Vascular/Lymphatic: Aortic atherosclerosis. Mass closely abuts the central superior mesenteric vein anteriorly with less than 50% contact (series 17, image 58). Fat planes are preserved to the superior mesenteric artery. No enlarged abdominal lymph nodes. Other: No abdominal wall hernia or abnormality. No ascites. Musculoskeletal: No acute or significant osseous findings. IMPRESSION: 1. Hypoenhancing mass in the inferior pancreatic head and uncinate measuring 3.4 x 2.3 cm, consistent with pancreatic adenocarcinoma. 2. There is abrupt effacement of both the pancreatic and common  bile ducts by mass with upstream dilatation of the pancreatic duct measuring up to 0.8 cm. 3. Severe intra and extrahepatic biliary ductal dilatation, the common bile duct measuring up to 1.4 cm and as above, abruptly effaced above the level of the ampulla by mass. 4. Mass closely abuts the central superior mesenteric vein anteriorly with less than 50% contact. Fat planes are preserved to the superior mesenteric artery. 5. No evidence of lymphadenopathy or metastatic disease in the abdomen. Electronically Signed   By: Marolyn JONETTA Jaksch M.D.   On: 12/07/2023 18:55   MR 3D Recon At Scanner Result Date: 12/07/2023 CLINICAL DATA:  Abnormal pancreas by prior CT, neoplasm EXAM: MRI ABDOMEN WITHOUT AND WITH CONTRAST (INCLUDING MRCP) TECHNIQUE: Multiplanar multisequence MR imaging of the abdomen was performed both before and after the administration of intravenous contrast. Heavily T2-weighted images of the biliary and pancreatic ducts were obtained, and three-dimensional MRCP images were rendered by post processing. CONTRAST:  10mL GADAVIST GADOBUTROL 1 MMOL/ML IV SOLN COMPARISON:  CT abdomen pelvis, 12/07/2023 FINDINGS: Lower chest: No acute abnormality. Hepatobiliary: No focal liver abnormality is seen. Status post cholecystectomy. Severe intra and extrahepatic biliary ductal dilatation, the common bile duct measuring up to 1.4 cm and abruptly effaced above the level of the ampulla by mass (series 2, image 14). Pancreas: Hypoenhancing mass in the inferior pancreatic head and uncinate measuring 3.4 x 2.3 cm (series 15, image 58). There is abrupt effacement of both the pancreatic and common bile ducts upstream dilatation pancreatic duct measuring up to 0.8 cm. Cystic change in the adjacent uncinate (series 5, image 31). Spleen: Normal in size without significant abnormality. Adrenals/Urinary Tract: Adrenal glands are unremarkable. Kidneys are normal, without renal calculi, solid lesion, or hydronephrosis. Stomach/Bowel:  Stomach is within normal limits. No evidence of bowel wall thickening, distention, or inflammatory changes. Vascular/Lymphatic: Aortic atherosclerosis. Mass closely abuts the central superior mesenteric vein anteriorly with less than 50% contact (series 17, image 58). Fat planes are preserved to the superior mesenteric artery. No enlarged abdominal lymph nodes. Other: No abdominal wall hernia or abnormality. No ascites. Musculoskeletal: No acute or significant osseous findings. IMPRESSION: 1. Hypoenhancing mass  in the inferior pancreatic head and uncinate measuring 3.4 x 2.3 cm, consistent with pancreatic adenocarcinoma. 2. There is abrupt effacement of both the pancreatic and common bile ducts by mass with upstream dilatation of the pancreatic duct measuring up to 0.8 cm. 3. Severe intra and extrahepatic biliary ductal dilatation, the common bile duct measuring up to 1.4 cm and as above, abruptly effaced above the level of the ampulla by mass. 4. Mass closely abuts the central superior mesenteric vein anteriorly with less than 50% contact. Fat planes are preserved to the superior mesenteric artery. 5. No evidence of lymphadenopathy or metastatic disease in the abdomen. Electronically Signed   By: Marolyn JONETTA Jaksch M.D.   On: 12/07/2023 18:55   CT ABDOMEN PELVIS W CONTRAST Result Date: 12/07/2023 CLINICAL DATA:  Abdominal pain. EXAM: CT ABDOMEN AND PELVIS WITH CONTRAST TECHNIQUE: Multidetector CT imaging of the abdomen and pelvis was performed using the standard protocol following bolus administration of intravenous contrast. RADIATION DOSE REDUCTION: This exam was performed according to the departmental dose-optimization program which includes automated exposure control, adjustment of the mA and/or kV according to patient size and/or use of iterative reconstruction technique. CONTRAST:  75mL OMNIPAQUE  IOHEXOL  350 MG/ML SOLN COMPARISON:  CT abdomen pelvis dated 12/02/2023. FINDINGS: Lower chest: No acute abnormality.  There is coronary vascular calcification. No intra-abdominal free air or free fluid. Hepatobiliary: The liver is unremarkable. There is dilatation of the intrahepatic and extrahepatic biliary tree. Cholecystectomy. No retained calcified stone noted in the central CBD. Pancreas: Ill-defined low attenuating area in the region of the uncinate process of the pancreas as seen on the prior CT which may represent pancreatitis but concerning for pancreatic neoplasm. Further evaluation with MRI/MRCP recommended. A small adjacent cyst again noted. There is mild atrophy of the pancreas and mild dilatation of the main pancreatic duct. Spleen: Normal in size without focal abnormality. Adrenals/Urinary Tract: The adrenal glands unremarkable. Mild bilateral renal parenchyma atrophy. There is no hydronephrosis on either side. There is symmetric enhancement and excretion of contrast by both kidneys. Similar appearance of a 1 cm anterior lower pole hypodense lesion in the left kidney. The visualized ureters and urinary bladder appear unremarkable. Stomach/Bowel: There is no bowel obstruction or active inflammation. The appendix is normal. Vascular/Lymphatic: Mild aortoiliac atherosclerotic disease. The IVC is unremarkable. No portal venous gas. There is no adenopathy. Reproductive: Calcified uterine fibroids. No suspicious adnexal masses. Other: None Musculoskeletal: Degenerative changes of the spine. No acute osseous pathology. IMPRESSION: 1. Ill-defined low attenuating area in the region of the uncinate process of the pancreas as seen on the prior CT may represent pancreatitis but concerning for pancreatic neoplasm. Further evaluation with MRI/MRCP recommended. 2. No bowel obstruction. Normal appendix. 3.  Aortic Atherosclerosis (ICD10-I70.0). Electronically Signed   By: Vanetta Chou M.D.   On: 12/07/2023 15:29   US  Abdomen Limited RUQ (LIVER/GB) Result Date: 12/07/2023 CLINICAL DATA:  Abdominal pain. EXAM: ULTRASOUND ABDOMEN  LIMITED RIGHT UPPER QUADRANT COMPARISON:  CT abdomen/pelvis dated 12/02/2023. FINDINGS: Evaluation is limited due to obscuration from overlying bowel gas and soft tissues. Gallbladder: Surgically absent. Common bile duct: Not visualized due to obscuration from overlying bowel gas and soft tissues. Known extrahepatic and intrahepatic biliary ductal dilation noted on the prior CT abdomen/pelvis dated 12/02/2023 is not well visualized on this exam. Liver: No appreciable abnormality. Portal vein is patent on color Doppler imaging with normal direction of blood flow towards the liver. Other: None. IMPRESSION: 1. Limited evaluation due to obscuration from overlying bowel gas  and soft tissues. No acute sonographic abnormality identified. 2. Known extrahepatic and intrahepatic biliary ductal dilation noted on the prior CT abdomen/pelvis dated 12/02/2023 is not well visualized on this exam. Electronically Signed   By: Harrietta Sherry M.D.   On: 12/07/2023 14:51     Assessment/Plan Principal Problem:   Jaundice Active Problems:   Hypokalemia   Essential hypertension   Pancreatic mass   Elevated LFTs   Type 2 diabetes mellitus with hyperglycemia (HCC)    Jaundice with pancreatic mass concerning for pancreatic adenocarcinoma -    gastroenterologist Dr. Abran has been consulted.  Will keep patient n.p.o. past midnight in anticipation of ERCP. Hyponatremia and hypokalemia could be from poor oral intake and also hydrochlorothiazide  use.  Replace potassium and recheck metabolic panel.  Hold hydrochlorothiazide .  Gently hydrate. Diabetes mellitus type 2 with hyperglycemia last hemoglobin A1c was 10 2 weeks ago.  Was recently started on Lantus  insulin .  For now keep patient on sliding scale coverage while NPO. Hypertension continue amlodipine  holding hydrochlorothiazide  due to hyponatremia and hypokalemia. Elevated LFTs likely from #1.  Since patient has obstructive jaundice with pancreatic mass concerning for  pancreatic adenocarcinoma will need further workup and more than 2 midnight stay.   DVT prophylaxis: SCDs. Code Status: Full code. Family Communication: Discussed with patient. Disposition Plan: Medical floor. Consults called: Solicitor. Admission status: Inpatient.

## 2023-12-08 NOTE — Assessment & Plan Note (Signed)
 -  see above

## 2023-12-08 NOTE — Transfer of Care (Signed)
 Immediate Anesthesia Transfer of Care Note  Patient: Lindsay Hardy  Procedure(s) Performed: ERCP, WITH INTERVENTION IF INDICATED BRUSH BIOPSY, BILE DUCT INSERTION, STENT, BILE DUCT  Patient Location: PACU  Anesthesia Type:General  Level of Consciousness: awake, alert , and oriented  Airway & Oxygen  Therapy: Patient Spontanous Breathing and Patient connected to face mask oxygen   Post-op Assessment: Report given to RN and Post -op Vital signs reviewed and stable  Post vital signs: Reviewed and stable  Last Vitals:  Vitals Value Taken Time  BP 145/80 12/08/23 12:00  Temp 36.6 C 12/08/23 12:00  Pulse 100 12/08/23 12:02  Resp 20 12/08/23 12:02  SpO2 100 % 12/08/23 12:02  Vitals shown include unfiled device data.  Last Pain:  Vitals:   12/08/23 1200  TempSrc:   PainSc: 0-No pain         Complications: No notable events documented.

## 2023-12-08 NOTE — Progress Notes (Signed)
 Progress Note    Lindsay Hardy   FMW:[id number]  DOB: 22-Oct-1950  DOA: 12/07/2023     1 PCP: Geofm Glade PARAS, MD  Initial CC: Abdominal pain  Hospital Course: Lindsay Hardy is a 73 y.o. female with PMH DMII, HTN who presented with ongoing abdominal pain.  She has had associated nausea as well.  She was undergoing evaluation outpatient with primary care with upcoming MRCP however due to persistent abdominal pain, she presented to the ER. Prior CT abdomen/pelvis from 10/28/2023 showed ill-defined hypoattenuation involving the pancreas.  This was again seen on repeat CT A/P on 6/26 along with biliary ductal dilatation noted on both scans. Ultimately, MRCP was performed on admission showing hypoenhancing mass involving inferior pancreatic head and uncinate process measuring 3.4 x 2.3 cm concerning for pancreatic adenocarcinoma.  There was also effacement of the pancreatic and CBD due to the mass with dilation of the pancreatic duct up to 8 mm.  CBD was dilated to 1.4 cm. GI was consulted on admission.  She underwent ERCP on 12/08/2023 and underwent biliary sphincterotomy along with placement of metal biliary stent into the CBD.  Interval History:  Seen this afternoon after returning from ERCP.  Very mild abdominal discomfort otherwise doing better.  Tolerating liquids and wishing to advance to soft diet for dinner as well.  Denies any nausea or vomiting.  Assessment and Plan: * Pancreatic mass - Initially noted on CT on 10/28/2023 but further imaging was necessary -Follow-up images have been performed since and ultimately she was to undergo MRI abdomen outpatient but presented due to persistent pain -MRCP notes 3.4 x 2.3 cm pancreatic mass concerning for pancreatic adenocarcinoma.  There is also associated pancreatic duct and CBD dilatation - now s/p ERCP with GI on 7/2 with sphincterotomy and CBD stent placement -Eventually needs EUS with GI and outpatient referral to oncology and/or advanced  bilary surgery - tolerating liquids post ERCP and wishes to advance her diet to soft - Monitor for tolerance of diet advancement  Obstructive jaundice - Total bili 13.4 on admission with uptrend to 15.8 -Underwent sphincterotomy with CBD stent placement on 12/08/2023 - Repeat LFTs in a.m. -If any worsening abdominal pain, will check lipase given recent ERCP  Hypokalemia - Replete as needed  Type 2 diabetes mellitus with hyperglycemia (HCC) - A1c 10.1% on 11/23/2023 -Continue SSI and CBG monitoring for now -At home, on glipizide , Lantus   Elevated LFTs - see above  Essential hypertension - Mildly above goal - Continue amlodipine  - Continue pain control  Microcytic anemia - Presumed in setting of underlying pancreatic malignancy - Check iron studies, B12, folate    Old records reviewed in assessment of this patient  Antimicrobials:   DVT prophylaxis:  SCDs Start: 12/08/23 0034   Code Status:   Code Status: Full Code  Mobility Assessment (Last 72 Hours)     Mobility Assessment     Row Name 12/08/23 1444 12/08/23 0752 12/08/23 0400       Does patient have an order for bedrest or is patient medically unstable No - Continue assessment No - Continue assessment No - Continue assessment     What is the highest level of mobility based on the progressive mobility assessment? Level 5 (Walks with assist in room/hall) - Balance while stepping forward/back and can walk in room with assist - Complete Level 2 (Chairfast) - Balance while sitting on edge of bed and cannot stand Level 2 (Chairfast) - Balance while sitting on edge of bed and cannot  stand     Is the above level different from baseline mobility prior to current illness? No - Consider discontinuing PT/OT -- No - Consider discontinuing PT/OT        Barriers to discharge: none Disposition Plan:  Home HH orders placed: n/a Status is: Inpt  Objective: Blood pressure (!) 160/88, pulse (!) 103, temperature 98.3 F (36.8  C), resp. rate 12, height 5' 5 (1.651 m), weight 111.3 kg, SpO2 99%.  Examination:  Physical Exam Constitutional:      Appearance: Normal appearance.  HENT:     Head: Normocephalic and atraumatic.     Mouth/Throat:     Mouth: Mucous membranes are moist.  Eyes:     Extraocular Movements: Extraocular movements intact.  Cardiovascular:     Rate and Rhythm: Normal rate and regular rhythm.  Pulmonary:     Effort: Pulmonary effort is normal. No respiratory distress.     Breath sounds: Normal breath sounds. No wheezing.  Abdominal:     General: Bowel sounds are normal. There is no distension.     Palpations: Abdomen is soft.     Tenderness: There is no abdominal tenderness.  Musculoskeletal:        General: Normal range of motion.     Cervical back: Normal range of motion and neck supple.  Skin:    General: Skin is warm and dry.  Neurological:     General: No focal deficit present.     Mental Status: She is alert.  Psychiatric:        Mood and Affect: Mood normal.      Consultants:  GI  Procedures:  7/2: ERCP  Data Reviewed: Results for orders placed or performed during the hospital encounter of 12/07/23 (from the past 24 hours)  Acetaminophen  level     Status: Abnormal   Collection Time: 12/07/23  7:53 PM  Result Value Ref Range   Acetaminophen  (Tylenol ), Serum <10 (L) 10 - 30 ug/mL  Bilirubin, direct     Status: Abnormal   Collection Time: 12/07/23  7:53 PM  Result Value Ref Range   Bilirubin, Direct 10.0 (H) 0.0 - 0.2 mg/dL  Hepatitis panel, acute     Status: None   Collection Time: 12/07/23  7:53 PM  Result Value Ref Range   Hepatitis B Surface Ag NON REACTIVE NON REACTIVE   HCV Ab NON REACTIVE NON REACTIVE   Hep A IgM NON REACTIVE NON REACTIVE   Hep B C IgM NON REACTIVE NON REACTIVE  Glucose, capillary     Status: Abnormal   Collection Time: 12/08/23  3:43 AM  Result Value Ref Range   Glucose-Capillary 273 (H) 70 - 99 mg/dL  Hepatic function panel      Status: Abnormal   Collection Time: 12/08/23  4:22 AM  Result Value Ref Range   Total Protein 6.1 (L) 6.5 - 8.1 g/dL   Albumin 2.3 (L) 3.5 - 5.0 g/dL   AST 728 (H) 15 - 41 U/L   ALT 301 (H) 0 - 44 U/L   Alkaline Phosphatase 518 (H) 38 - 126 U/L   Total Bilirubin 14.2 (H) 0.0 - 1.2 mg/dL   Bilirubin, Direct 8.9 (H) 0.0 - 0.2 mg/dL   Indirect Bilirubin 5.3 (H) 0.3 - 0.9 mg/dL  Basic metabolic panel     Status: Abnormal   Collection Time: 12/08/23  4:22 AM  Result Value Ref Range   Sodium 133 (L) 135 - 145 mmol/L   Potassium 3.0 (L) 3.5 -  5.1 mmol/L   Chloride 98 98 - 111 mmol/L   CO2 25 22 - 32 mmol/L   Glucose, Bld 269 (H) 70 - 99 mg/dL   BUN 7 (L) 8 - 23 mg/dL   Creatinine, Ser 9.18 0.44 - 1.00 mg/dL   Calcium  8.5 (L) 8.9 - 10.3 mg/dL   GFR, Estimated >39 >39 mL/min   Anion gap 10 5 - 15  CBC     Status: Abnormal   Collection Time: 12/08/23  4:22 AM  Result Value Ref Range   WBC 6.4 4.0 - 10.5 K/uL   RBC 4.17 3.87 - 5.11 MIL/uL   Hemoglobin 10.4 (L) 12.0 - 15.0 g/dL   HCT 68.9 (L) 63.9 - 53.9 %   MCV 74.3 (L) 80.0 - 100.0 fL   MCH 24.9 (L) 26.0 - 34.0 pg   MCHC 33.5 30.0 - 36.0 g/dL   RDW 80.4 (H) 88.4 - 84.4 %   Platelets 266 150 - 400 K/uL   nRBC 0.0 0.0 - 0.2 %  Glucose, capillary     Status: Abnormal   Collection Time: 12/08/23  6:30 AM  Result Value Ref Range   Glucose-Capillary 246 (H) 70 - 99 mg/dL  Glucose, capillary     Status: Abnormal   Collection Time: 12/08/23  8:23 AM  Result Value Ref Range   Glucose-Capillary 206 (H) 70 - 99 mg/dL  Glucose, capillary     Status: Abnormal   Collection Time: 12/08/23 10:35 AM  Result Value Ref Range   Glucose-Capillary 172 (H) 70 - 99 mg/dL  Glucose, capillary     Status: Abnormal   Collection Time: 12/08/23 12:09 PM  Result Value Ref Range   Glucose-Capillary 149 (H) 70 - 99 mg/dL  Glucose, capillary     Status: Abnormal   Collection Time: 12/08/23  4:11 PM  Result Value Ref Range   Glucose-Capillary 253 (H) 70 -  99 mg/dL   Comment 1 Document in Chart     I have reviewed pertinent nursing notes, vitals, labs, and images as necessary. I have ordered labwork to follow up on as indicated.  I have reviewed the last notes from staff over past 24 hours. I have discussed patient's care plan and test results with nursing staff, CM/SW, and other staff as appropriate.  Time spent: Greater than 50% of the 55 minute visit was spent in counseling/coordination of care for the patient as laid out in the A&P.   LOS: 1 day   Alm Apo, MD [facility] 12/08/2023, 4:19 PM

## 2023-12-08 NOTE — Assessment & Plan Note (Addendum)
 Repleted.

## 2023-12-08 NOTE — Assessment & Plan Note (Signed)
-   Total bili 13.4 on admission with uptrend to 15.8 -Underwent sphincterotomy with CBD stent placement on 12/08/2023 - Repeat LFTs in a.m. -If any worsening abdominal pain, will check lipase given recent ERCP

## 2023-12-08 NOTE — Consult Note (Signed)
 Consultation  Referring Provider:  [facility]/ Roxanne Primary Care Physician:  Geofm Glade PARAS, MD Primary Gastroenterologist:  [place] Reason for Consultation:   Abdominal pain, nausea, jaundice, abnormal MRCP  HPI: Lindsay Hardy is a 73 y.o. female with history of remote CVA, not on antiplatelet therapy, history of coronary artery disease, diabetes mellitus with recent need for insulin  initiation, hyperlipidemia, mild congestive heart failure/diastolic dysfunction.  She is status post cholecystectomy. Patient had seen her PCP recently with complaints of abdominal pain nausea and decrease in appetite which is gradually progressed over the past month or so.  She was found to have elevated LFTs and was in the process of getting set up for MRCP apparently.  This was going to be delayed a couple of weeks and therefore advised to come to the ER. Patient says that she has been having fairly constant epigastric and right upper quadrant pain over the past month usually somewhat worse postprandially, no radiation to her back.  No associated fever or chills, again nauseated but not vomiting and with poor appetite.  She feels that she has lost weight but is uncertain as to the amount. Workup in the ER yesterday WBC 6.2/hemoglobin 11.2/hematocrit 34.3/MCV 75.6 Sodium 129/potassium 3.3/9/creatinine 0.85 Albumin 2.7 T. bili 15.8/alk phos 584/AST 340/ALT 343 Lipase was 337 on 11/23/2023, down to 111 yesterday INR 1.0/pro time 14.2 Acute hepatitis panel negative   CT abdomen and pelvis with contrast yesterday showed unremarkable liver, dilation of the intrahepatic and extrahepatic biliary tree status postcholecystectomy, ill-defined low attenuating area in the region of the uncinate process of the pancreas as seen on prior CT concerning for pancreatic neoplasm.  MRI/MRCP-no hepatic abnormality, severe intra and extrahepatic biliary ductal dilation and CBD 1.4 cm and abruptly effaced above the level of  the ampulla by mass Hypoenhancing mass in the inferior pancreatic head and uncinate process measuring 3.4 x 2.3 cm with abrupt effacement of both the pancreatic and common bile ducts with upstream dilation, there is cystic change in the uncinate.  The mass closely abuts the central superior mesenteric vein anteriorly with less than 50% contact, fat planes are preserved to the SMA no enlarged lymph nodes.  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 Avram   Granulomatous disease, chronic (HCC)     in right upper lobe per x-ray December 2005   Hyperlipidemia    Hypertension    NAFLD (nonalcoholic fatty liver disease)     based on ultrasound June 2002, elevated transaminase   OA (osteoarthritis)     DJP coracoclavicular ligament, left patella osteophytes June 2002, L4-S1 spondylosis, degenerative disc disease with bulge. I in October 2001, the C2-C4 spondylosis without stenosis and with spurs based on x-ray July 2005; diffuse idiopathic skeletal hyperostosis with bilateral  hip lumbar degenerative changes   on x-ray February 2005, bilateral plantar calcaneal spurs   Reactive airway disease     peak flow 180 in May 2005, chronic sinusitis with PND, bronchitis December 2002 and strep pneumonia April 2007,  FEC 66, MCV 60, ratio is 69, DLCO 57,  moderate  restrictive and mild obstructive disease  on spirometry May 2005    Past Surgical History:  Procedure Laterality Date   CARPAL TUNNEL RELEASE  1992    left arm 1992   CHOLECYSTECTOMY  2003   TONSILLECTOMY AND ADENOIDECTOMY      Prior to Admission medications   Medication Sig Start Date End Date Taking? Authorizing Provider  albuterol  (VENTOLIN  HFA) 108 (90 Base) MCG/ACT inhaler INHALE 2 PUFFS INTO THE LUNGS EVERY 4 (FOUR) HOURS AS NEEDED FOR SHORTNESS OF BREATH. 03/12/23  Yes Burns, Glade PARAS, MD  amLODipine  (NORVASC ) 10 MG tablet Take 1 tablet (10 mg total) by mouth daily. 03/12/23  Yes Burns, Glade PARAS, MD  cyanocobalamin  (VITAMIN B12) 500 MCG tablet Take 500 mcg by mouth daily.   Yes [provider]  fluticasone  (FLONASE ) 50 MCG/ACT nasal spray Place 2 sprays into both nostrils daily. 03/12/23  Yes Burns, Glade PARAS, MD  glipiZIDE  (GLUCOTROL ) 10 MG tablet TAKE 2 TABLETS BY MOUTH 2 TIMES DAILY BEFORE A MEAL. Patient taking differently: Take 10 mg by mouth 2 (two) times daily before a meal. TAKE 2 TABLETS BY MOUTH 2 TIMES DAILY BEFORE A MEAL. 03/12/23  Yes Burns, Glade PARAS, MD  hydrochlorothiazide  (HYDRODIURIL ) 25 MG tablet Take 1 tablet (25 mg total) by mouth daily. 11/23/23  Yes Burns, Glade PARAS, MD  HYDROcodone -acetaminophen  (NORCO) 10-325 MG tablet Take 1 tablet by mouth every 4 (four) hours as needed for moderate pain (pain score 4-6) or severe pain (pain score 7-10). 11/30/23 12/30/23 Yes [provider]  ibuprofen  (ADVIL ) 800 MG tablet TAKE 1 TABLET BY MOUTH TWICE DAILY AS NEEDED FOR MODERATE PAIN 11/30/23  Yes Burns, Glade PARAS, MD  insulin  glargine (LANTUS ) 100 UNIT/ML Solostar Pen Inject 20 Units into the skin daily. 11/30/23  Yes Burns, Glade PARAS, MD  linaclotide  (LINZESS ) 145 MCG CAPS capsule TAKE 1 CAPSULE BY MOUTH DAILY BEFORE BREAKFAST. Patient not taking: Reported on 12/07/2023 03/12/23   Geofm Glade PARAS, MD  metFORMIN  (GLUCOPHAGE -XR) 500 MG 24 hr tablet TAKE 2 TABLETS BY MOUTH TWICE  DAILY BEFORE A MEAL Patient taking differently: Take 500 mg by mouth 2 (two) times daily with a meal. TAKE 2 TABLETS BY MOUTH TWICE DAILY BEFORE A MEAL 09/23/23   Burns, Glade PARAS, MD  montelukast  (SINGULAIR ) 10 MG tablet TAKE 1 TABLET BY MOUTH EVRY DAY AT BEDTIME Patient not taking: Reported on 12/07/2023 09/23/23   Geofm Glade PARAS, MD  naloxone  (NARCAN ) nasal spray 4 mg/0.1 mL 1 spray in one nostril x1; may repeat dose in alternate nostrils q2-3min prn 03/07/23   Geofm Glade PARAS, MD  potassium chloride  (MICRO-K ) 10 MEQ CR capsule TAKE 1 CAPSULE BY MOUTH TWICE A DAY Patient taking differently: Take 10 mEq by mouth as needed. 06/07/23   Geofm Glade PARAS, MD    Current Facility-Administered Medications  Medication Dose Route Frequency Provider Last Rate Last Admin   acetaminophen  (TYLENOL ) tablet 650 mg  650 mg Oral Q6H PRN Shona Laurence N, DO       amLODipine  (NORVASC ) tablet 10 mg  10 mg Oral Daily Franky Redia SAILOR, MD       HYDROmorphone  (DILAUDID ) injection 0.2 mg  0.2  mg Intravenous Q4H PRN Franky Redia SAILOR, MD   0.2 mg at 12/08/23 9373   insulin  aspart (novoLOG) injection 0-9 Units  0-9 Units Subcutaneous Q4H Franky Redia SAILOR, MD   3 Units at 12/08/23 9356   melatonin tablet 5 mg  5 mg Oral QHS PRN Shona Terry SAILOR, DO       polyethylene glycol (MIRALAX / GLYCOLAX) packet 17 g  17 g Oral Daily PRN Shona Terry N, DO       prochlorperazine (COMPAZINE) injection 5 mg  5 mg Intravenous Q6H PRN Shona Terry N, DO   5 mg at 12/08/23 0445    Allergies as of 12/07/2023 - Review Complete 12/07/2023  Allergen Reaction Noted   Atorvastatin  06/27/2006   Clopidogrel bisulfate     Crestor  [rosuvastatin  calcium ]  03/25/2016   Fexofenadine     Furosemide  06/27/2006   Gabapentin   08/13/2020   Hydrochlorothiazide -triamterene     Lisinopril     Metoprolol tartrate     Mometasone furoate     Telmisartan   04/09/2021   Ozempic  (0.25 or 0.5 mg-dose) [semaglutide (0.25 or 0.5mg -dos)]  11/01/2019     Family History  Problem Relation Age of Onset   Lung cancer Mother    Hypertension Mother    Lung cancer Sister    Hypertension Sister     Social History   Socioeconomic History   Marital status: Single    Spouse name: Not on file   Number of children: 1   Years of education: 14   Highest education level: Some college, no degree  Occupational History   Occupation:  former Agricultural engineer  Tobacco Use   Smoking status: Former    Current packs/day: 0.25    Average packs/day: 0.3 packs/day for 4.0 years (1.0 ttl pk-yrs)    Types: Cigarettes   Smokeless tobacco: Never   Tobacco comments:    smoked in 20's   Vaping Use   Vaping status: Never Used  Substance and Sexual Activity   Alcohol  use: No    Alcohol /week: 0.0 standard drinks of alcohol    Drug use: No   Sexual activity: Not Currently    Birth control/protection: Post-menopausal  Other Topics Concern   Not on file  Social History Narrative   Born and raised in [place], [place]. Currently lives in a private residence with her nephew and his wife. Fun: Church activities, spending time with family   Denies any religious beliefs that would effect health care.    Social Drivers of Corporate investment banker Strain: Low Risk  (11/18/2023)   Overall Financial Resource Strain (CARDIA)    Difficulty of Paying Living Expenses: Not hard at all  Food Insecurity: No Food Insecurity (12/08/2023)   Hunger Vital Sign    Worried About Running Out of Food in the Last Year: Never true    Ran Out of Food in the Last Year: Never true  Transportation Needs: No Transportation Needs (12/08/2023)   PRAPARE - Administrator, Civil Service (Medical): No    Lack of Transportation (Non-Medical): No  Physical Activity: Inactive (11/18/2023)   Exercise Vital Sign    Days of Exercise per Week: 0 days    Minutes of Exercise per Session: 0 min  Stress: No Stress Concern Present (11/18/2023)   [employer] of Occupational Health  - Occupational Stress Questionnaire    Feeling of Stress: Only a little  Social Connections: Moderately Integrated (12/08/2023)   Social Connection and Isolation Panel  Frequency of Communication with Friends and Family: More than three times a week    Frequency of Social Gatherings with Friends and Family: More than three times a week    Attends Religious Services: More than 4 times per year    Active Member of [employer] or Organizations: No    Attends Banker Meetings: Never    Marital Status: Living with partner  Intimate Partner Violence: Not At Risk (12/08/2023)   Humiliation, Afraid, Rape, and Kick questionnaire    Fear of Current or Ex-Partner: No    Emotionally Abused: No    Physically Abused: No    Sexually Abused: No    Review of Systems: Pertinent positive and negative review of systems were noted in the above HPI section.  All other review of systems was otherwise negative.   Physical Exam: Vital signs in last 24 hours: Temp:  [98 F (36.7 C)-98.7 F (37.1 C)] 98.3 F (36.8 C) (07/02 0822) Pulse Rate:  [77-103] 90 (07/02 0822) Resp:  [15-19] 15 (07/02 0822) BP: (120-144)/(73-89) 120/73 (07/02 0822) SpO2:  [99 %-100 %] 100 % (07/02 0822) Weight:  [111.3 kg-114 kg] 111.3 kg (07/02 0351) Last BM Date : 12/08/23 (in ED per patient) General:   Alert,  Well-developed, well-nourished elderly African-American female, pleasant and cooperative in NAD Head:  Normocephalic and atraumatic. Eyes:  Sclera icteric, conjunctiva pink. Ears:  Normal auditory acuity. Nose:  No deformity, discharge,  or lesions. Mouth:  No deformity or lesions.   Neck:  Supple; no masses or thyromegaly. Lungs:  Clear throughout to auscultation.   No wheezes, crackles, or rhonchi.  Heart:  Regular rate and rhythm; no murmurs, clicks, rubs,  or gallops. Abdomen:  Soft, obese, patient tender in the epigastrium/hypogastrium, no guarding or rebound no palpable mass or hepatosplenomegaly, BS  active Rectal:  Deferred  Msk:  Symmetrical without gross deformities. . Pulses:  Normal pulses noted. Extremities:  Without clubbing or edema. Neurologic:  Alert and  oriented x4;  grossly normal neurologically. Skin:  Intact without significant lesions or rashes.. Psych:  Alert and cooperative. Normal mood and affect.  Intake/Output from previous day: 07/01 0701 - 07/02 0700 In: 200 [IV Piggyback:200] Out: -  Intake/Output this shift: No intake/output data recorded.  Lab Results: Recent Labs    12/07/23 1334 12/08/23 0422  WBC 6.2 6.4  HGB 11.2* 10.4*  HCT 34.3* 31.0*  PLT 273 266   BMET Recent Labs    12/06/23 1219 12/07/23 1334 12/08/23 0422  NA 132* 129* 133*  K 3.0* 3.3* 3.0*  CL 94* 93* 98  CO2 29 23 25   GLUCOSE 232* 307* 269*  BUN 10 9 7*  CREATININE 0.93 0.85 0.81  CALCIUM  9.1 8.9 8.5*   LFT Recent Labs    12/08/23 0422  PROT 6.1*  ALBUMIN 2.3*  AST 271*  ALT 301*  ALKPHOS 518*  BILITOT 14.2*  BILIDIR 8.9*  IBILI 5.3*   PT/INR Recent Labs    12/07/23 1334  LABPROT 14.2  INR 1.0   Hepatitis Panel Recent Labs    12/07/23 1953  HEPBSAG NON REACTIVE  HCVAB NON REACTIVE  HEPAIGM NON REACTIVE  HEPBIGM NON REACTIVE     IMPRESSION:  #109 73 year old African-American female presenting with 1 month history of epigastric pain somewhat progressive, poor appetite nausea and found to have elevated LFTs as an outpatient.  Had had initial workup with CT and difficulty getting MRCP scheduled so advised to come to the ER.  Repeat CT  yesterday then MRI which shows hypoenhancing mass in the inferior pancreatic head and uncinate measuring 3.4 x 2.3 cm consistent with pancreatic adenocarcinoma, there is abrupt effacement of both the pancreatic and common bile duct by mass with upstream dilation, mass closely abuts the central superior mesenteric vein anteriorly with less than 50% contact and fat planes are preserved to the superior mesenteric artery   #2  adult onset diabetes mellitus, recent requirement for insulin  to poor control #3 mild diastolic dysfunction #4 NAFLD #5 diabetes mellitus #6 coronary artery disease #7  Coronary artery disease #8 status postcholecystectomy  Plan; keep n.p.o. this a.m. Patient will be scheduled for ERCP, brushings and stent placement with Dr. Rosalie today.  I discussed the procedure in detail with the patient, we reviewed biliary images, and specifically discussed potential risks of general anesthesia, procedure failure, bleeding, perforation, and 3 to 4% risk of pancreatitis.  She is agreeable to proceed.  She would like her son to be called postprocedure.  GI will follow with you, further recommendations pending findings at ERCP.   Diamante Rubin EsterwoodPA-C  12/08/2023, 8:59 AM

## 2023-12-08 NOTE — Anesthesia Postprocedure Evaluation (Signed)
 Anesthesia Post Note  Patient: Lindsay Hardy  Procedure(s) Performed: ERCP, WITH INTERVENTION IF INDICATED BRUSH BIOPSY, BILE DUCT INSERTION, STENT, BILE DUCT     Patient location during evaluation: PACU Anesthesia Type: General Level of consciousness: awake and alert Pain management: pain level controlled Vital Signs Assessment: post-procedure vital signs reviewed and stable Respiratory status: spontaneous breathing, nonlabored ventilation and respiratory function stable Cardiovascular status: blood pressure returned to baseline and stable Postop Assessment: no apparent nausea or vomiting Anesthetic complications: no   No notable events documented.  Last Vitals:  Vitals:   12/08/23 1215 12/08/23 1230  BP: 132/79 (!) 151/83  Pulse: 93 87  Resp: 17 14  Temp:    SpO2: 95% 95%    Last Pain:  Vitals:   12/08/23 1230  TempSrc:   PainSc: 5                  Anis Cinelli,W. EDMOND

## 2023-12-08 NOTE — Assessment & Plan Note (Signed)
-   Mildly above goal - Continue amlodipine  - Continue pain control

## 2023-12-08 NOTE — Op Note (Signed)
 [facility] Patient Name: Lindsay Hardy Procedure Date : 12/08/2023 MRN: [id number] Attending MD: Oliva Boots , MD, [id number] Date of Birth: 1951/04/05 CSN: [id number] Age: 73 Admit Type: Inpatient Procedure:                ERCP Indications:              Abnormal MRCP, Jaundice, Tumor of the head of                            pancreas Providers:                Oliva Boots, MD, Ozell Pouch, [place],                            Technician Referring MD:              Medicines:                General Anesthesia Complications:            No immediate complications. Estimated Blood Loss:     Estimated blood loss: none. Procedure:                Pre-Anesthesia Assessment:                           - Prior to the procedure, a History and Physical                            was performed, and patient medications and                            allergies were reviewed. The patient's tolerance of                            previous anesthesia was also reviewed. The risks                            and benefits of the procedure and the sedation                            options and risks were discussed with the patient.                            All questions were answered, and informed consent                            was obtained. Prior Anticoagulants: The patient has                            taken no anticoagulant or antiplatelet agents. ASA                            Grade Assessment: III - A patient with severe                            systemic disease. After  reviewing the risks and                            benefits, the patient was deemed in satisfactory                            condition to undergo the procedure.                           After obtaining informed consent, the scope was                            passed under direct vision. Throughout the                            procedure, the patient's blood pressure, pulse, and                             oxygen  saturations were monitored continuously. The                            [id number]-[id number] ([id number] Olympus duodenoscope was                            introduced through the mouth, and used to inject                            contrast into and used to cannulate the bile duct.                            The ERCP was accomplished without difficulty. The                            patient tolerated the procedure well. Scope In: Scope Out: Findings:      The major papilla was congested. The major papilla was bulging. Deep       selective cannulation was fairly readily obtained and an obvious distal       stricture was seen on initial cholangiogram and we proceeded with a       small to medium size biliary sphincterotomy was made with a Hydratome       sphincterotome using ERBE electrocautery. There was no       post-sphincterotomy bleeding. Cells for cytology were obtained by       brushing in the lower third of the main bile duct. One 10 Fr by 6 cm       covered metal biliary stent with no external flaps and no internal flaps       was placed 5 cm into the common bile duct. Clear fluid flowed through       the stent. The stent was in good position. The wire and introducer were       removed and the scope was removed and the patient tolerated the       procedure well and there was no pancreatic duct injection or wire       advancement throughout the procedure Impression:               -  The major papilla appeared congested.                           - The major papilla appeared to be bulging.                           - A biliary sphincterotomy was performed.                           - Cells for cytology obtained in the lower third of                            the main duct.                           - One covered metal biliary stent was placed into                            the common bile duct. Recommendation:           - Clear liquid diet for 6 hours. If doing well this                             evening may have soft solids otherwise may slowly                            advance tomorrow                           - Continue present medications.                           - Return to GI clinic PRN.                           - Telephone GI clinic if symptomatic PRN.                           - Check liver enzymes (AST, ALT, alkaline                            phosphatase, bilirubin) and hemogram with white                            blood cell count and platelets in the morning. And                            follow liver tests back to near normal as an                            outpatient                           - Await cytology results. And consult oncology and  determine if EUS is needed Procedure Code(s):        --- Professional ---                           [phone], Endoscopic retrograde                            cholangiopancreatography (ERCP); with placement of                            endoscopic stent into biliary or pancreatic duct,                            including pre- and post-dilation and guide wire                            passage, when performed, including sphincterotomy,                            when performed, each stent Diagnosis Code(s):        --- Professional ---                           R17, Unspecified jaundice                           D49.0, Neoplasm of unspecified behavior of                            digestive system                           K83.8, Other specified diseases of biliary tract                           R93.2, Abnormal findings on diagnostic imaging of                            liver and biliary tract CPT copyright 2022 [employer]. All rights reserved. The codes documented in this report are preliminary and upon coder review may  be revised to meet current compliance requirements. Oliva Boots, MD 12/08/2023 12:00:56 PM This report has been signed  electronically. Number of Addenda: 0

## 2023-12-08 NOTE — Anesthesia Preprocedure Evaluation (Addendum)
 Anesthesia Evaluation  Patient identified by MRN, date of birth, ID band Patient awake    Reviewed: Allergy & Precautions, H&P , NPO status , Patient's Chart, lab work & pertinent test results  Airway Mallampati: III  TM Distance: >3 FB Neck ROM: Full    Dental no notable dental hx. (+) Teeth Intact, Dental Advisory Given   Pulmonary asthma , former smoker   Pulmonary exam normal breath sounds clear to auscultation       Cardiovascular hypertension, Pt. on medications + CAD and + Peripheral Vascular Disease   Rhythm:Regular Rate:Normal     Neuro/Psych CVA  negative psych ROS   GI/Hepatic Neg liver ROS,GERD  ,,  Endo/Other  diabetes, Type 2, Oral Hypoglycemic Agents, Insulin  Dependent  Class 3 obesity  Renal/GU negative Renal ROS  negative genitourinary   Musculoskeletal  (+) Arthritis , Osteoarthritis,    Abdominal   Peds  Hematology negative hematology ROS (+)   Anesthesia Other Findings   Reproductive/Obstetrics negative OB ROS                              Anesthesia Physical Anesthesia Plan  ASA: 3  Anesthesia Plan: General   Post-op Pain Management: Minimal or no pain anticipated   Induction: Intravenous  PONV Risk Score and Plan: 4 or greater and Ondansetron , Dexamethasone and Treatment may vary due to age or medical condition  Airway Management Planned: Oral ETT  Additional Equipment:   Intra-op Plan:   Post-operative Plan: Extubation in OR  Informed Consent: I have reviewed the patients History and Physical, chart, labs and discussed the procedure including the risks, benefits and alternatives for the proposed anesthesia with the patient or authorized representative who has indicated his/her understanding and acceptance.     Dental advisory given  Plan Discussed with: CRNA  Anesthesia Plan Comments:          Anesthesia Quick Evaluation

## 2023-12-09 ENCOUNTER — Inpatient Hospital Stay (HOSPITAL_COMMUNITY)

## 2023-12-09 ENCOUNTER — Other Ambulatory Visit: Payer: Self-pay | Admitting: *Deleted

## 2023-12-09 ENCOUNTER — Encounter: Payer: Self-pay | Admitting: *Deleted

## 2023-12-09 DIAGNOSIS — K8689 Other specified diseases of pancreas: Secondary | ICD-10-CM

## 2023-12-09 DIAGNOSIS — K831 Obstruction of bile duct: Secondary | ICD-10-CM | POA: Diagnosis not present

## 2023-12-09 LAB — CBC WITH DIFFERENTIAL/PLATELET
Abs Immature Granulocytes: 0.18 10*3/uL — ABNORMAL HIGH (ref 0.00–0.07)
Basophils Absolute: 0 10*3/uL (ref 0.0–0.1)
Basophils Relative: 0 %
Eosinophils Absolute: 0 10*3/uL (ref 0.0–0.5)
Eosinophils Relative: 0 %
HCT: 32.5 % — ABNORMAL LOW (ref 36.0–46.0)
Hemoglobin: 10.8 g/dL — ABNORMAL LOW (ref 12.0–15.0)
Immature Granulocytes: 2 %
Lymphocytes Relative: 13 %
Lymphs Abs: 1 10*3/uL (ref 0.7–4.0)
MCH: 24.9 pg — ABNORMAL LOW (ref 26.0–34.0)
MCHC: 33.2 g/dL (ref 30.0–36.0)
MCV: 75.1 fL — ABNORMAL LOW (ref 80.0–100.0)
Monocytes Absolute: 0.6 10*3/uL (ref 0.1–1.0)
Monocytes Relative: 7 %
Neutro Abs: 6.2 10*3/uL (ref 1.7–7.7)
Neutrophils Relative %: 78 %
Platelets: 264 10*3/uL (ref 150–400)
RBC: 4.33 MIL/uL (ref 3.87–5.11)
RDW: 19.5 % — ABNORMAL HIGH (ref 11.5–15.5)
WBC: 8 10*3/uL (ref 4.0–10.5)
nRBC: 0 % (ref 0.0–0.2)

## 2023-12-09 LAB — COMPREHENSIVE METABOLIC PANEL WITH GFR
ALT: 287 U/L — ABNORMAL HIGH (ref 0–44)
AST: 150 U/L — ABNORMAL HIGH (ref 15–41)
Albumin: 2.4 g/dL — ABNORMAL LOW (ref 3.5–5.0)
Alkaline Phosphatase: 513 U/L — ABNORMAL HIGH (ref 38–126)
Anion gap: 11 (ref 5–15)
BUN: 6 mg/dL — ABNORMAL LOW (ref 8–23)
CO2: 25 mmol/L (ref 22–32)
Calcium: 8.5 mg/dL — ABNORMAL LOW (ref 8.9–10.3)
Chloride: 95 mmol/L — ABNORMAL LOW (ref 98–111)
Creatinine, Ser: 0.79 mg/dL (ref 0.44–1.00)
GFR, Estimated: >60 mL/min (ref >60)
Glucose, Bld: 302 mg/dL — ABNORMAL HIGH (ref 70–99)
Potassium: 3.4 mmol/L — ABNORMAL LOW (ref 3.5–5.1)
Sodium: 131 mmol/L — ABNORMAL LOW (ref 135–145)
Total Bilirubin: 7.8 mg/dL — ABNORMAL HIGH (ref 0.0–1.2)
Total Protein: 6.6 g/dL (ref 6.5–8.1)

## 2023-12-09 LAB — IRON AND TIBC
Iron: 65 ug/dL (ref 28–170)
Saturation Ratios: 25 % (ref 10.4–31.8)
TIBC: 258 ug/dL (ref 250–450)
UIBC: 193 ug/dL

## 2023-12-09 LAB — FOLATE: Folate: 9.6 ng/mL (ref >5.9)

## 2023-12-09 LAB — GLUCOSE, CAPILLARY
Glucose-Capillary: 287 mg/dL — ABNORMAL HIGH (ref 70–99)
Glucose-Capillary: 272 mg/dL — ABNORMAL HIGH (ref 70–99)
Glucose-Capillary: 273 mg/dL — ABNORMAL HIGH (ref 70–99)
Glucose-Capillary: 308 mg/dL — ABNORMAL HIGH (ref 70–99)
Glucose-Capillary: 377 mg/dL — ABNORMAL HIGH (ref 70–99)

## 2023-12-09 LAB — MAGNESIUM: Magnesium: 1.7 mg/dL (ref 1.7–2.4)

## 2023-12-09 LAB — CYTOLOGY - NON PAP

## 2023-12-09 LAB — FERRITIN: Ferritin: 456 ng/mL — ABNORMAL HIGH (ref 11–307)

## 2023-12-09 LAB — VITAMIN B12: Vitamin B-12: 1603 pg/mL — ABNORMAL HIGH (ref 180–914)

## 2023-12-09 MED ORDER — ALBUTEROL SULFATE (2.5 MG/3ML) 0.083% IN NEBU
3.0000 mL | INHALATION_SOLUTION | Freq: Once | RESPIRATORY_TRACT | Status: AC | PRN
Start: 2023-12-09 — End: 2023-12-10
  Administered 2023-12-10: 3 mL via RESPIRATORY_TRACT
  Filled 2023-12-09: qty 3

## 2023-12-09 MED ORDER — POTASSIUM CHLORIDE CRYS ER 20 MEQ PO TBCR
40.0000 meq | EXTENDED_RELEASE_TABLET | Freq: Once | ORAL | Status: AC
  Administered 2023-12-09: 40 meq via ORAL
  Filled 2023-12-09: qty 2

## 2023-12-09 NOTE — Progress Notes (Signed)
 Patient ID: Lindsay Hardy, female   DOB: [birth year], 73 y.o.   MRN: [id number]    Progress Note   Subjective   Day # 2 CC; biliary obstruction, pancreatic mass  ERCP yesterday-cytology brushings done, obvious distal stricture -10 French by 6 cm covered metal biliary stent with no external flaps and no internal flaps placed CBD, stent in good position.  Cytology pending  Labs today WBC 8.0/hemoglobin 10.8/hematocrit 32.5/MCV 75.1 Sodium 131/potassium 3.4/creatinine 0.79 T. bili 7.8/alk phos 513/AST 150/ALT 287-much improved Ferritin 456/iron 65/TIBC 258/iron sat 25 B12 1603  Patient able to eat solid food for breakfast, no complaints of any significant abdominal pain or nausea does not feel any different than she did preprocedure yesterday.  She is expressing concern about going home, does not want to go home over the next few days as she does not have any help at home.    Objective   Vital signs in last 24 hours: Temp:  [97.9 F (36.6 C)-98.7 F (37.1 C)] 98.5 F (36.9 C) (07/03 0404) Pulse Rate:  [86-107] 87 (07/03 0404) Resp:  [12-20] 19 (07/03 0404) BP: (132-162)/(75-94) 159/79 (07/03 0404) SpO2:  [95 %-100 %] 98 % (07/03 0404) Last BM Date : 12/08/23 General:    Older African-American female in NAD ,sclera anicteric Heart:  Regular rate and rhythm; no murmurs Lungs: Respirations even and unlabored, lungs CTA bilaterally Abdomen:  Soft, nontender and nondistended. Normal bowel sounds. Extremities:  Without edema. Neurologic:  Alert and oriented,  grossly normal neurologically. Psych:  Cooperative. Normal mood and affect.  Intake/Output from previous day: 07/02 0701 - 07/03 0700 In: 640 [P.O.:240; I.V.:400] Out: -  Intake/Output this shift: No intake/output data recorded.  Lab Results: Recent Labs    12/07/23 1334 12/08/23 0422 12/09/23 0317  WBC 6.2 6.4 8.0  HGB 11.2* 10.4* 10.8*  HCT 34.3* 31.0* 32.5*  PLT 273 266 264   BMET Recent Labs     12/07/23 1334 12/08/23 0422 12/09/23 0317  NA 129* 133* 131*  K 3.3* 3.0* 3.4*  CL 93* 98 95*  CO2 23 25 25   GLUCOSE 307* 269* 302*  BUN 9 7* 6*  CREATININE 0.85 0.81 0.79  CALCIUM  8.9 8.5* 8.5*   LFT Recent Labs    12/08/23 0422 12/09/23 0317  PROT 6.1* 6.6  ALBUMIN 2.3* 2.4*  AST 271* 150*  ALT 301* 287*  ALKPHOS 518* 513*  BILITOT 14.2* 7.8*  BILIDIR 8.9*  --   IBILI 5.3*  --    PT/INR Recent Labs    12/07/23 1334  LABPROT 14.2  INR 1.0    Studies/Results: MR ABDOMEN MRCP W WO CONTAST Result Date: 12/07/2023 CLINICAL DATA:  Abnormal pancreas by prior CT, neoplasm EXAM: MRI ABDOMEN WITHOUT AND WITH CONTRAST (INCLUDING MRCP) TECHNIQUE: Multiplanar multisequence MR imaging of the abdomen was performed both before and after the administration of intravenous contrast. Heavily T2-weighted images of the biliary and pancreatic ducts were obtained, and three-dimensional MRCP images were rendered by post processing. CONTRAST:  10mL GADAVIST GADOBUTROL 1 MMOL/ML IV SOLN COMPARISON:  CT abdomen pelvis, 12/07/2023 FINDINGS: Lower chest: No acute abnormality. Hepatobiliary: No focal liver abnormality is seen. Status post cholecystectomy. Severe intra and extrahepatic biliary ductal dilatation, the common bile duct measuring up to 1.4 cm and abruptly effaced above the level of the ampulla by mass (series 2, image 14). Pancreas: Hypoenhancing mass in the inferior pancreatic head and uncinate measuring 3.4 x 2.3 cm (series 15, image 58). There is abrupt effacement of both  the pancreatic and common bile ducts upstream dilatation pancreatic duct measuring up to 0.8 cm. Cystic change in the adjacent uncinate (series 5, image 31). Spleen: Normal in size without significant abnormality. Adrenals/Urinary Tract: Adrenal glands are unremarkable. Kidneys are normal, without renal calculi, solid lesion, or hydronephrosis. Stomach/Bowel: Stomach is within normal limits. No evidence of bowel wall  thickening, distention, or inflammatory changes. Vascular/Lymphatic: Aortic atherosclerosis. Mass closely abuts the central superior mesenteric vein anteriorly with less than 50% contact (series 17, image 58). Fat planes are preserved to the superior mesenteric artery. No enlarged abdominal lymph nodes. Other: No abdominal wall hernia or abnormality. No ascites. Musculoskeletal: No acute or significant osseous findings. IMPRESSION: 1. Hypoenhancing mass in the inferior pancreatic head and uncinate measuring 3.4 x 2.3 cm, consistent with pancreatic adenocarcinoma. 2. There is abrupt effacement of both the pancreatic and common bile ducts by mass with upstream dilatation of the pancreatic duct measuring up to 0.8 cm. 3. Severe intra and extrahepatic biliary ductal dilatation, the common bile duct measuring up to 1.4 cm and as above, abruptly effaced above the level of the ampulla by mass. 4. Mass closely abuts the central superior mesenteric vein anteriorly with less than 50% contact. Fat planes are preserved to the superior mesenteric artery. 5. No evidence of lymphadenopathy or metastatic disease in the abdomen. Electronically Signed   By: Marolyn JONETTA Jaksch M.D.   On: 12/07/2023 18:55   MR 3D Recon At Scanner Result Date: 12/07/2023 CLINICAL DATA:  Abnormal pancreas by prior CT, neoplasm EXAM: MRI ABDOMEN WITHOUT AND WITH CONTRAST (INCLUDING MRCP) TECHNIQUE: Multiplanar multisequence MR imaging of the abdomen was performed both before and after the administration of intravenous contrast. Heavily T2-weighted images of the biliary and pancreatic ducts were obtained, and three-dimensional MRCP images were rendered by post processing. CONTRAST:  10mL GADAVIST GADOBUTROL 1 MMOL/ML IV SOLN COMPARISON:  CT abdomen pelvis, 12/07/2023 FINDINGS: Lower chest: No acute abnormality. Hepatobiliary: No focal liver abnormality is seen. Status post cholecystectomy. Severe intra and extrahepatic biliary ductal dilatation, the common  bile duct measuring up to 1.4 cm and abruptly effaced above the level of the ampulla by mass (series 2, image 14). Pancreas: Hypoenhancing mass in the inferior pancreatic head and uncinate measuring 3.4 x 2.3 cm (series 15, image 58). There is abrupt effacement of both the pancreatic and common bile ducts upstream dilatation pancreatic duct measuring up to 0.8 cm. Cystic change in the adjacent uncinate (series 5, image 31). Spleen: Normal in size without significant abnormality. Adrenals/Urinary Tract: Adrenal glands are unremarkable. Kidneys are normal, without renal calculi, solid lesion, or hydronephrosis. Stomach/Bowel: Stomach is within normal limits. No evidence of bowel wall thickening, distention, or inflammatory changes. Vascular/Lymphatic: Aortic atherosclerosis. Mass closely abuts the central superior mesenteric vein anteriorly with less than 50% contact (series 17, image 58). Fat planes are preserved to the superior mesenteric artery. No enlarged abdominal lymph nodes. Other: No abdominal wall hernia or abnormality. No ascites. Musculoskeletal: No acute or significant osseous findings. IMPRESSION: 1. Hypoenhancing mass in the inferior pancreatic head and uncinate measuring 3.4 x 2.3 cm, consistent with pancreatic adenocarcinoma. 2. There is abrupt effacement of both the pancreatic and common bile ducts by mass with upstream dilatation of the pancreatic duct measuring up to 0.8 cm. 3. Severe intra and extrahepatic biliary ductal dilatation, the common bile duct measuring up to 1.4 cm and as above, abruptly effaced above the level of the ampulla by mass. 4. Mass closely abuts the central superior mesenteric vein anteriorly with  less than 50% contact. Fat planes are preserved to the superior mesenteric artery. 5. No evidence of lymphadenopathy or metastatic disease in the abdomen. Electronically Signed   By: Marolyn JONETTA Jaksch M.D.   On: 12/07/2023 18:55   CT ABDOMEN PELVIS W CONTRAST Result Date:  12/07/2023 CLINICAL DATA:  Abdominal pain. EXAM: CT ABDOMEN AND PELVIS WITH CONTRAST TECHNIQUE: Multidetector CT imaging of the abdomen and pelvis was performed using the standard protocol following bolus administration of intravenous contrast. RADIATION DOSE REDUCTION: This exam was performed according to the departmental dose-optimization program which includes automated exposure control, adjustment of the mA and/or kV according to patient size and/or use of iterative reconstruction technique. CONTRAST:  75mL OMNIPAQUE  IOHEXOL  350 MG/ML SOLN COMPARISON:  CT abdomen pelvis dated 12/02/2023. FINDINGS: Lower chest: No acute abnormality. There is coronary vascular calcification. No intra-abdominal free air or free fluid. Hepatobiliary: The liver is unremarkable. There is dilatation of the intrahepatic and extrahepatic biliary tree. Cholecystectomy. No retained calcified stone noted in the central CBD. Pancreas: Ill-defined low attenuating area in the region of the uncinate process of the pancreas as seen on the prior CT which may represent pancreatitis but concerning for pancreatic neoplasm. Further evaluation with MRI/MRCP recommended. A small adjacent cyst again noted. There is mild atrophy of the pancreas and mild dilatation of the main pancreatic duct. Spleen: Normal in size without focal abnormality. Adrenals/Urinary Tract: The adrenal glands unremarkable. Mild bilateral renal parenchyma atrophy. There is no hydronephrosis on either side. There is symmetric enhancement and excretion of contrast by both kidneys. Similar appearance of a 1 cm anterior lower pole hypodense lesion in the left kidney. The visualized ureters and urinary bladder appear unremarkable. Stomach/Bowel: There is no bowel obstruction or active inflammation. The appendix is normal. Vascular/Lymphatic: Mild aortoiliac atherosclerotic disease. The IVC is unremarkable. No portal venous gas. There is no adenopathy. Reproductive: Calcified uterine  fibroids. No suspicious adnexal masses. Other: None Musculoskeletal: Degenerative changes of the spine. No acute osseous pathology. IMPRESSION: 1. Ill-defined low attenuating area in the region of the uncinate process of the pancreas as seen on the prior CT may represent pancreatitis but concerning for pancreatic neoplasm. Further evaluation with MRI/MRCP recommended. 2. No bowel obstruction. Normal appendix. 3.  Aortic Atherosclerosis (ICD10-I70.0). Electronically Signed   By: Vanetta Chou M.D.   On: 12/07/2023 15:29   US  Abdomen Limited RUQ (LIVER/GB) Result Date: 12/07/2023 CLINICAL DATA:  Abdominal pain. EXAM: ULTRASOUND ABDOMEN LIMITED RIGHT UPPER QUADRANT COMPARISON:  CT abdomen/pelvis dated 12/02/2023. FINDINGS: Evaluation is limited due to obscuration from overlying bowel gas and soft tissues. Gallbladder: Surgically absent. Common bile duct: Not visualized due to obscuration from overlying bowel gas and soft tissues. Known extrahepatic and intrahepatic biliary ductal dilation noted on the prior CT abdomen/pelvis dated 12/02/2023 is not well visualized on this exam. Liver: No appreciable abnormality. Portal vein is patent on color Doppler imaging with normal direction of blood flow towards the liver. Other: None. IMPRESSION: 1. Limited evaluation due to obscuration from overlying bowel gas and soft tissues. No acute sonographic abnormality identified. 2. Known extrahepatic and intrahepatic biliary ductal dilation noted on the prior CT abdomen/pelvis dated 12/02/2023 is not well visualized on this exam. Electronically Signed   By: Harrietta Sherry M.D.   On: 12/07/2023 14:51       Assessment / Plan:    #1 73 year old African-American female admitted with complaints of abdominal pain/discomfort over the past month, nausea and new onset jaundice.  MRI/MRCP on admission showed no hepatic abnormality,  severe intra and extrahepatic ductal dilation with CBD of 1.4 cm abruptly effaced at the level of  the ampulla by mass, there is a hypoenhancing mass in the inferior head of the pancreas 3.4 x 2.3 cm with abrupt effacement of both the pancreatic and common bile duct with upstream dilation, mass closely abuts the central SMV anteriorly fat planes are preserved to the SMA and no enlarged lymph nodes   ERCP yesterday successful with brushings, sphincterotomy and placement of covered metal biliary stent.   Patient tolerated the procedure well, no complaints of abdominal pain today. LFTs are already significantly improving  Cytology pending  Plan; diet as tolerates Continue to trend LFTs to normalization Will follow-up cytology Oncology is already on board and patient has appointment with Dr. Cloretta scheduled for 12/16/2023 CT of the chest could be done while here for staging If cytology is negative she will need EUS with biopsy which can be arranged as an outpatient with Dr. Wil[place].  GI will sign off, available if needed     Principal Problem:   Pancreatic mass Active Problems:   Hypokalemia   Microcytic anemia   Essential hypertension   Elevated LFTs   Type 2 diabetes mellitus with hyperglycemia (HCC)   Obstructive jaundice     LOS: 2 days   Brei Pociask EsterwoodPA-C  12/09/2023, 8:38 AM

## 2023-12-09 NOTE — Progress Notes (Signed)
 Oncology Discharge Planning Note  [facility] Address: [address], [place], [place] [address] Hours of Operation:  [place], Monday - Friday  Clinic Contact Information:  [phone]  Oncology Care Team: Medical Oncologist:  Cloretta   Patient Details: Name:  Lindsay Hardy, Lindsay Hardy MRN:   [id number] DOB:   [birth year] Reason for Current Admission: @PPROB @  Discharge Planning Narrative: Notification of admission received by inpatient team for [facility].  Discharge follow-up appointments for oncology are current and available on the AVS and MyChart.   Upon discharge from the hospital, hematology/oncology's post discharge plan of care for the outpatient setting is: Thursday, December 16, 2023 at 1:40pm  [facility] [address] [place], [place] [address]  [phone]   [name] will be called within two business days after discharge to review hematology/oncology's plan of care for full understanding.    Outpatient Oncology Specific Care Only: Oncology appointment transportation needs addressed?: yes, son will transport Oncology medication management for symptom management addressed?:  not applicable Chemo Alert Card reviewed?:  not applicable Immunotherapy Alert Card reviewed?:  not applicable

## 2023-12-09 NOTE — Plan of Care (Signed)

## 2023-12-09 NOTE — Progress Notes (Signed)
 Referral for Oncology entered  Pt to be scheduled with Dr Cloretta on 12/16/23 at 1:40pm.  Appointment information will be on D/C paperwork
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
## 2023-12-09 NOTE — Progress Notes (Signed)
 Pt keep refusing shower ,offered several time by NT , witnessed .

## 2023-12-09 NOTE — Progress Notes (Signed)
 Progress Note    Lindsay Hardy   FMW:[id number]  DOB: 19-Sep-1950  DOA: 12/07/2023     2 PCP: Geofm Glade PARAS, MD  Initial CC: Abdominal pain  Hospital Course: Lindsay Hardy is a 73 y.o. female with PMH DMII, HTN who presented with ongoing abdominal pain.  She has had associated nausea as well.  She was undergoing evaluation outpatient with primary care with upcoming MRCP however due to persistent abdominal pain, she presented to the ER. Prior CT abdomen/pelvis from 10/28/2023 showed ill-defined hypoattenuation involving the pancreas.  This was again seen on repeat CT A/P on 6/26 along with biliary ductal dilatation noted on both scans. Ultimately, MRCP was performed on admission showing hypoenhancing mass involving inferior pancreatic head and uncinate process measuring 3.4 x 2.3 cm concerning for pancreatic adenocarcinoma.  There was also effacement of the pancreatic and CBD due to the mass with dilation of the pancreatic duct up to 8 mm.  CBD was dilated to 1.4 cm. GI was consulted on admission.  She underwent ERCP on 12/08/2023 and underwent biliary sphincterotomy along with placement of metal biliary stent into the CBD.  Interval History:  No events overnight.  Soft diet still causing a little bit of abdominal discomfort and she wants to trial mechanical soft.  Assessment and Plan: * Pancreatic mass - Initially noted on CT on 10/28/2023 but further imaging was necessary -Follow-up images have been performed since and ultimately she was to undergo MRI abdomen outpatient but presented due to persistent pain -MRCP notes 3.4 x 2.3 cm pancreatic mass concerning for pancreatic adenocarcinoma.  There is also associated pancreatic duct and CBD dilatation - now s/p ERCP with GI on 7/2 with sphincterotomy and CBD stent placement -Eventually needs EUS with GI and outpatient referral to oncology and/or advanced bilary surgery - tolerating liquids post ERCP - Did not quite tolerate soft diet as much as  she hoped and wishes to reduce down to mechanical soft - Also obtaining CT chest for staging purposes  Obstructive jaundice - Total bili 13.4 on admission with uptrend to 15.8 -Underwent sphincterotomy with CBD stent placement on 12/08/2023 - LFTs slowly improving  Hypokalemia - Replete as needed  Type 2 diabetes mellitus with hyperglycemia (HCC) - A1c 10.1% on 11/23/2023 -Continue SSI and CBG monitoring for now -At home on glipizide , Lantus   Elevated LFTs - see above  Essential hypertension - Continue amlodipine  - Continue pain control  Microcytic anemia - Presumed in setting of underlying pancreatic malignancy - B12 adequate 1,603 - Folate 9.6 - Iron stores normal.  Ferritin mildly high as expected in setting of underlying presumed malignancy and/or reactive    Old records reviewed in assessment of this patient  Antimicrobials:   DVT prophylaxis:  SCDs Start: 12/08/23 0034   Code Status:   Code Status: Full Code  Mobility Assessment (Last 72 Hours)     Mobility Assessment     Row Name 12/09/23 0816 12/08/23 1937 12/08/23 1444 12/08/23 0752 12/08/23 0400   Does patient have an order for bedrest or is patient medically unstable No - Continue assessment No - Continue assessment No - Continue assessment No - Continue assessment No - Continue assessment   What is the highest level of mobility based on the progressive mobility assessment? Level 5 (Walks with assist in room/hall) - Balance while stepping forward/back and can walk in room with assist - Complete Level 5 (Walks with assist in room/hall) - Balance while stepping forward/back and can walk in room with assist -  Complete Level 5 (Walks with assist in room/hall) - Balance while stepping forward/back and can walk in room with assist - Complete Level 2 (Chairfast) - Balance while sitting on edge of bed and cannot stand Level 2 (Chairfast) - Balance while sitting on edge of bed and cannot stand   Is the above level  different from baseline mobility prior to current illness? No - Consider discontinuing PT/OT No - Consider discontinuing PT/OT No - Consider discontinuing PT/OT -- No - Consider discontinuing PT/OT      Barriers to discharge: none Disposition Plan:  Home HH orders placed: n/a Status is: Inpt  Objective: Blood pressure 138/72, pulse 99, temperature 98.3 F (36.8 C), temperature source Oral, resp. rate 18, height 5' 5 (1.651 m), weight 111.3 kg, SpO2 97%.  Examination:  Physical Exam Constitutional:      Appearance: Normal appearance.  HENT:     Head: Normocephalic and atraumatic.     Mouth/Throat:     Mouth: Mucous membranes are moist.  Eyes:     Extraocular Movements: Extraocular movements intact.  Cardiovascular:     Rate and Rhythm: Normal rate and regular rhythm.  Pulmonary:     Effort: Pulmonary effort is normal. No respiratory distress.     Breath sounds: Normal breath sounds. No wheezing.  Abdominal:     General: Bowel sounds are normal. There is no distension.     Palpations: Abdomen is soft.     Tenderness: There is no abdominal tenderness.  Musculoskeletal:        General: Normal range of motion.     Cervical back: Normal range of motion and neck supple.  Skin:    General: Skin is warm and dry.  Neurological:     General: No focal deficit present.     Mental Status: She is alert.  Psychiatric:        Mood and Affect: Mood normal.      Consultants:  GI  Procedures:  7/2: ERCP  Data Reviewed: Results for orders placed or performed during the hospital encounter of 12/07/23 (from the past 24 hours)  Glucose, capillary     Status: Abnormal   Collection Time: 12/08/23  8:52 PM  Result Value Ref Range   Glucose-Capillary 320 (H) 70 - 99 mg/dL  Glucose, capillary     Status: Abnormal   Collection Time: 12/08/23 11:45 PM  Result Value Ref Range   Glucose-Capillary 345 (H) 70 - 99 mg/dL  CBC with Differential/Platelet     Status: Abnormal   Collection  Time: 12/09/23  3:17 AM  Result Value Ref Range   WBC 8.0 4.0 - 10.5 K/uL   RBC 4.33 3.87 - 5.11 MIL/uL   Hemoglobin 10.8 (L) 12.0 - 15.0 g/dL   HCT 67.4 (L) 63.9 - 53.9 %   MCV 75.1 (L) 80.0 - 100.0 fL   MCH 24.9 (L) 26.0 - 34.0 pg   MCHC 33.2 30.0 - 36.0 g/dL   RDW 80.4 (H) 88.4 - 84.4 %   Platelets 264 150 - 400 K/uL   nRBC 0.0 0.0 - 0.2 %   Neutrophils Relative % 78 %   Neutro Abs 6.2 1.7 - 7.7 K/uL   Lymphocytes Relative 13 %   Lymphs Abs 1.0 0.7 - 4.0 K/uL   Monocytes Relative 7 %   Monocytes Absolute 0.6 0.1 - 1.0 K/uL   Eosinophils Relative 0 %   Eosinophils Absolute 0.0 0.0 - 0.5 K/uL   Basophils Relative 0 %   Basophils  Absolute 0.0 0.0 - 0.1 K/uL   Immature Granulocytes 2 %   Abs Immature Granulocytes 0.18 (H) 0.00 - 0.07 K/uL  Comprehensive metabolic panel     Status: Abnormal   Collection Time: 12/09/23  3:17 AM  Result Value Ref Range   Sodium 131 (L) 135 - 145 mmol/L   Potassium 3.4 (L) 3.5 - 5.1 mmol/L   Chloride 95 (L) 98 - 111 mmol/L   CO2 25 22 - 32 mmol/L   Glucose, Bld 302 (H) 70 - 99 mg/dL   BUN 6 (L) 8 - 23 mg/dL   Creatinine, Ser 9.20 0.44 - 1.00 mg/dL   Calcium  8.5 (L) 8.9 - 10.3 mg/dL   Total Protein 6.6 6.5 - 8.1 g/dL   Albumin 2.4 (L) 3.5 - 5.0 g/dL   AST 849 (H) 15 - 41 U/L   ALT 287 (H) 0 - 44 U/L   Alkaline Phosphatase 513 (H) 38 - 126 U/L   Total Bilirubin 7.8 (H) 0.0 - 1.2 mg/dL   GFR, Estimated >39 >39 mL/min   Anion gap 11 5 - 15  Ferritin     Status: Abnormal   Collection Time: 12/09/23  3:17 AM  Result Value Ref Range   Ferritin 456 (H) 11 - 307 ng/mL  Folate     Status: None   Collection Time: 12/09/23  3:17 AM  Result Value Ref Range   Folate 9.6 >5.9 ng/mL  Iron and TIBC     Status: None   Collection Time: 12/09/23  3:17 AM  Result Value Ref Range   Iron 65 28 - 170 ug/dL   TIBC 741 749 - 549 ug/dL   Saturation Ratios 25 10.4 - 31.8 %   UIBC 193 ug/dL  Vitamin A87     Status: Abnormal   Collection Time: 12/09/23  3:17  AM  Result Value Ref Range   Vitamin B-12 1,603 (H) 180 - 914 pg/mL  Magnesium     Status: None   Collection Time: 12/09/23  3:17 AM  Result Value Ref Range   Magnesium 1.7 1.7 - 2.4 mg/dL  Glucose, capillary     Status: Abnormal   Collection Time: 12/09/23  4:05 AM  Result Value Ref Range   Glucose-Capillary 287 (H) 70 - 99 mg/dL  Glucose, capillary     Status: Abnormal   Collection Time: 12/09/23  8:47 AM  Result Value Ref Range   Glucose-Capillary 272 (H) 70 - 99 mg/dL  Glucose, capillary     Status: Abnormal   Collection Time: 12/09/23 11:51 AM  Result Value Ref Range   Glucose-Capillary 273 (H) 70 - 99 mg/dL    I have reviewed pertinent nursing notes, vitals, labs, and images as necessary. I have ordered labwork to follow up on as indicated.  I have reviewed the last notes from staff over past 24 hours. I have discussed patient's care plan and test results with nursing staff, CM/SW, and other staff as appropriate.  Time spent: Greater than 50% of the 55 minute visit was spent in counseling/coordination of care for the patient as laid out in the A&P.   LOS: 2 days   Alm Apo, MD [facility] 12/09/2023, 4:14 PM

## 2023-12-09 NOTE — Inpatient Diabetes Management (Addendum)
 Inpatient Diabetes Program Recommendations  AACE/ADA: New Consensus Statement on Inpatient Glycemic Control (2015)  Target Ranges:  Prepandial:   less than 140 mg/dL      Peak postprandial:   less than 180 mg/dL (1-2 hours)      Critically ill patients:  140 - 180 mg/dL    Latest Reference Range & Units 03/12/23 16:07 07/19/23 15:41 11/23/23 14:59  Hemoglobin A1C 4.6 - 6.5 % 7.2 ! 7.0 (H) 10.1 (H)  !: Data is abnormal (H): Data is abnormally high  Latest Reference Range & Units 12/08/23 03:43 12/08/23 06:30 12/08/23 08:23 12/08/23 10:35 12/08/23 12:09 12/08/23 16:11 12/08/23 20:52  Glucose-Capillary 70 - 99 mg/dL 726 (H) 753 (H)  3 units Novolog  206 (H)  3 units Novolog @0956  172 (H)  5 mg Decadron @1117  149 (H) 253 (H)  5 units Novolog  320 (H)  7 units Novolog @2210   (H): Data is abnormally high  Latest Reference Range & Units 12/08/23 23:45 12/09/23 04:05 12/09/23 08:47  Glucose-Capillary 70 - 99 mg/dL 654 (H) 712 (H) 727 (H)  5 units Novolog   (H): Data is abnormally high   Admit with:  Abdominal pain  MRCP shows pancreatic mass concerning for pancreatic adenocarcinoma with obstruction Hyponatremia and hypokalemia    History: DM2  Home DM Meds: Lantus  20 units daily       Glipizide  10 mg BID       Metformin  500 mg BID  Current Orders: Novolog Sensitive Correction Scale/ SSI (0-9 units) TID AC + HS     Recently started on Lantus  20 units daily as outpatient by her PCP PCP: Glade Hope, MD ERCP completed 07/02   Of note, pt got 5 mg Decadron X 1 dose yest at 11am CBGs elevated last PM and again this AM likely due to Steroids   MD- Please consider starting 50% home dose Basal insulin  today: Semglee  10 units daily    Addendum 11:30am--Met w/ pt at bedside.  Reviewed current CBGs and in-hospital insulin  regimen.  Pt concerned she is not currently getting her oral DM meds Glipizide  and Metformin .  We talked about how the MD's usually don't restart the  home oral MD meds until we know the pt can take adequate POs and meals.  Pt told me she is now getting soft PO diet.  I asked pt about recently starting Lantus  20 units daily at home by orders of her PCP.  Pt told me she started the insulin  about 1 week ago or so.  Did not receive any training by the MD office nor the Pharmacy.  Told me she just figured it out on her own.  Told me she put the insulin  in the fridge along with the pen she has opened and is using.  We talked about how she can leave the current in-use insulin  pen at room temp--Leave new pens in the fridge.  Pt was able to give me an appropriate and accurate verbal step by step instructions of using the pen.  The only thing she wasn't doing was holding the needle in her skin for 5-10 sec after injection which we talked about and pt stated she would start doing.  We reviewed proper disposal of sharps.  I encouraged pt to check her CBGs at least BID at home (AM and PM) and to keep a record (has fingerstick monitor at home).  I also encouraged pt to call PCP office if her CBGs stay constantly >200 or if she has frequent  HYPO.  Pt stated she knows symptoms of HYPO and how to treat.  Pt very appreciative of visit.    --Will follow patient during hospitalization--  Adina Rudolpho Arrow RN, MSN, CDCES Diabetes Coordinator Inpatient Glycemic Control Team Team Pager: [phone] (8a-5p)

## 2023-12-10 ENCOUNTER — Encounter (HOSPITAL_COMMUNITY): Payer: Self-pay | Admitting: Gastroenterology

## 2023-12-10 DIAGNOSIS — C259 Malignant neoplasm of pancreas, unspecified: Secondary | ICD-10-CM

## 2023-12-10 LAB — GLUCOSE, CAPILLARY
Glucose-Capillary: 242 mg/dL — ABNORMAL HIGH (ref 70–99)
Glucose-Capillary: 251 mg/dL — ABNORMAL HIGH (ref 70–99)
Glucose-Capillary: 260 mg/dL — ABNORMAL HIGH (ref 70–99)
Glucose-Capillary: 375 mg/dL — ABNORMAL HIGH (ref 70–99)
Glucose-Capillary: 382 mg/dL — ABNORMAL HIGH (ref 70–99)
Glucose-Capillary: 387 mg/dL — ABNORMAL HIGH (ref 70–99)

## 2023-12-10 LAB — COMPREHENSIVE METABOLIC PANEL WITH GFR
ALT: 220 U/L — ABNORMAL HIGH (ref 0–44)
AST: 72 U/L — ABNORMAL HIGH (ref 15–41)
Albumin: 2.5 g/dL — ABNORMAL LOW (ref 3.5–5.0)
Alkaline Phosphatase: 433 U/L — ABNORMAL HIGH (ref 38–126)
Anion gap: 8 (ref 5–15)
BUN: 6 mg/dL — ABNORMAL LOW (ref 8–23)
CO2: 24 mmol/L (ref 22–32)
Calcium: 8.2 mg/dL — ABNORMAL LOW (ref 8.9–10.3)
Chloride: 99 mmol/L (ref 98–111)
Creatinine, Ser: 0.76 mg/dL (ref 0.44–1.00)
GFR, Estimated: >60 mL/min (ref >60)
Glucose, Bld: 248 mg/dL — ABNORMAL HIGH (ref 70–99)
Potassium: 3.1 mmol/L — ABNORMAL LOW (ref 3.5–5.1)
Sodium: 131 mmol/L — ABNORMAL LOW (ref 135–145)
Total Bilirubin: 5.9 mg/dL — ABNORMAL HIGH (ref 0.0–1.2)
Total Protein: 6.6 g/dL (ref 6.5–8.1)

## 2023-12-10 LAB — MAGNESIUM: Magnesium: 1.7 mg/dL (ref 1.7–2.4)

## 2023-12-10 MED ORDER — HYDROMORPHONE HCL 1 MG/ML IJ SOLN
1.0000 mg | INTRAMUSCULAR | Status: DC | PRN
  Administered 2023-12-10 – 2023-12-11 (×2): 1 mg via INTRAVENOUS
  Filled 2023-12-10 (×2): qty 1

## 2023-12-10 MED ORDER — NALOXONE HCL 0.4 MG/ML IJ SOLN: 0.4000 mg | INTRAMUSCULAR | Status: DC | PRN

## 2023-12-10 MED ORDER — HYDROMORPHONE HCL 1 MG/ML IJ SOLN: 1.0000 mg | INTRAMUSCULAR | Status: DC | PRN

## 2023-12-10 MED ORDER — SENNOSIDES-DOCUSATE SODIUM 8.6-50 MG PO TABS
1.0000 | ORAL_TABLET | Freq: Two times a day (BID) | ORAL | Status: DC
  Administered 2023-12-10 – 2023-12-13 (×6): 1 via ORAL
  Filled 2023-12-10 (×5): qty 1

## 2023-12-10 MED ORDER — OXYCODONE HCL 5 MG PO TABS
5.0000 mg | ORAL_TABLET | Freq: Four times a day (QID) | ORAL | Status: DC | PRN
  Filled 2023-12-10: qty 1

## 2023-12-10 MED ORDER — POTASSIUM CHLORIDE CRYS ER 20 MEQ PO TBCR
40.0000 meq | EXTENDED_RELEASE_TABLET | Freq: Once | ORAL | Status: AC
  Administered 2023-12-10: 40 meq via ORAL
  Filled 2023-12-10: qty 2

## 2023-12-10 MED ORDER — BISACODYL 10 MG RE SUPP
10.0000 mg | Freq: Every day | RECTAL | Status: DC | PRN
  Administered 2023-12-10: 10 mg via RECTAL
  Filled 2023-12-10: qty 1

## 2023-12-10 NOTE — Progress Notes (Signed)
 Progress Note    Lindsay Hardy   FMW:[id number]  DOB: Dec 10, 1950  DOA: 12/07/2023     3 PCP: Geofm Glade PARAS, MD  Initial CC: Abdominal pain  Hospital Course: Lindsay Hardy is a 73 y.o. female with PMH DMII, HTN who presented with ongoing abdominal pain.  She has had associated nausea as well.  She was undergoing evaluation outpatient with primary care with upcoming MRCP however due to persistent abdominal pain, she presented to the ER. Prior CT abdomen/pelvis from 10/28/2023 showed ill-defined hypoattenuation involving the pancreas.  This was again seen on repeat CT A/P on 6/26 along with biliary ductal dilatation noted on both scans. Ultimately, MRCP was performed on admission showing hypoenhancing mass involving inferior pancreatic head and uncinate process measuring 3.4 x 2.3 cm concerning for pancreatic adenocarcinoma.  There was also effacement of the pancreatic and CBD due to the mass with dilation of the pancreatic duct up to 8 mm.  CBD was dilated to 1.4 cm. GI was consulted on admission.  She underwent ERCP on 12/08/2023 and underwent biliary sphincterotomy along with placement of metal biliary stent into the CBD. Pathology returned consistent with pancreatic adenocarcinoma.  Interval History:  Patient wishing to advance diet to regular diet today. Discussed findings of pathology results with her bedside this morning.  Assessment and Plan: * Pancreatic adenocarcinoma (HCC) - Initially noted on CT on 10/28/2023 but further imaging was necessary -Follow-up images have been performed since and ultimately she was to undergo MRI abdomen outpatient but presented due to persistent pain -MRCP notes 3.4 x 2.3 cm pancreatic mass concerning for pancreatic adenocarcinoma.  There is also associated pancreatic duct and CBD dilatation - now s/p ERCP with GI on 7/2 with sphincterotomy and CBD stent placement - tolerating liquids post ERCP; diet advanced more today per patient request - Pathology  returned consistent with adenocarcinoma; discussed with patient 12/10/23 -Staging CT chest negative for evidence of metastatic disease - Outpatient follow-up planned with Dr. Cloretta and/or for referral to surgery  Obstructive jaundice - Total bili 13.4 on admission with uptrend to 15.8 -Underwent sphincterotomy with CBD stent placement on 12/08/2023 - LFTs slowly improving  Hypokalemia - Replete as needed  Type 2 diabetes mellitus with hyperglycemia (HCC) - A1c 10.1% on 11/23/2023 -Continue SSI and CBG monitoring for now -At home on glipizide , Lantus   Elevated LFTs - see above  Essential hypertension - Continue amlodipine  - Continue pain control  Microcytic anemia - Presumed in setting of underlying pancreatic malignancy - B12 adequate 1,603 - Folate 9.6 - Iron stores normal.  Ferritin mildly high as expected in setting of underlying presumed malignancy and/or reactive    Old records reviewed in assessment of this patient  Antimicrobials:   DVT prophylaxis:  SCDs Start: 12/08/23 0034   Code Status:   Code Status: Full Code  Mobility Assessment (Last 72 Hours)     Mobility Assessment     Row Name 12/10/23 1053 12/09/23 1946 12/09/23 0816 12/08/23 1937 12/08/23 1444   Does patient have an order for bedrest or is patient medically unstable No - Continue assessment No - Continue assessment No - Continue assessment No - Continue assessment No - Continue assessment   What is the highest level of mobility based on the progressive mobility assessment? Level 5 (Walks with assist in room/hall) - Balance while stepping forward/back and can walk in room with assist - Complete Level 5 (Walks with assist in room/hall) - Balance while stepping forward/back and can walk in room with  assist - Complete Level 5 (Walks with assist in room/hall) - Balance while stepping forward/back and can walk in room with assist - Complete Level 5 (Walks with assist in room/hall) - Balance while stepping  forward/back and can walk in room with assist - Complete Level 5 (Walks with assist in room/hall) - Balance while stepping forward/back and can walk in room with assist - Complete   Is the above level different from baseline mobility prior to current illness? -- No - Consider discontinuing PT/OT No - Consider discontinuing PT/OT No - Consider discontinuing PT/OT No - Consider discontinuing PT/OT    Row Name 12/08/23 0752 12/08/23 0400         Does patient have an order for bedrest or is patient medically unstable No - Continue assessment No - Continue assessment      What is the highest level of mobility based on the progressive mobility assessment? Level 2 (Chairfast) - Balance while sitting on edge of bed and cannot stand Level 2 (Chairfast) - Balance while sitting on edge of bed and cannot stand      Is the above level different from baseline mobility prior to current illness? -- No - Consider discontinuing PT/OT         Barriers to discharge: none Disposition Plan:  Home HH orders placed: n/a Status is: Inpt  Objective: Blood pressure (!) 146/79, pulse (!) 101, temperature 98.5 F (36.9 C), temperature source Oral, resp. rate 18, height 5' 5 (1.651 m), weight 111.3 kg, SpO2 96%.  Examination:  Physical Exam Constitutional:      Appearance: Normal appearance.  HENT:     Head: Normocephalic and atraumatic.     Mouth/Throat:     Mouth: Mucous membranes are moist.  Eyes:     Extraocular Movements: Extraocular movements intact.  Cardiovascular:     Rate and Rhythm: Normal rate and regular rhythm.  Pulmonary:     Effort: Pulmonary effort is normal. No respiratory distress.     Breath sounds: Normal breath sounds. No wheezing.  Abdominal:     General: Bowel sounds are normal. There is no distension.     Palpations: Abdomen is soft.     Tenderness: There is no abdominal tenderness.  Musculoskeletal:        General: Normal range of motion.     Cervical back: Normal range of  motion and neck supple.  Skin:    General: Skin is warm and dry.  Neurological:     General: No focal deficit present.     Mental Status: She is alert.  Psychiatric:        Mood and Affect: Mood normal.      Consultants:  GI  Procedures:  7/2: ERCP  Data Reviewed: Results for orders placed or performed during the hospital encounter of 12/07/23 (from the past 24 hours)  Glucose, capillary     Status: Abnormal   Collection Time: 12/09/23  4:43 PM  Result Value Ref Range   Glucose-Capillary 308 (H) 70 - 99 mg/dL  Glucose, capillary     Status: Abnormal   Collection Time: 12/09/23  8:13 PM  Result Value Ref Range   Glucose-Capillary 377 (H) 70 - 99 mg/dL  Glucose, capillary     Status: Abnormal   Collection Time: 12/10/23 12:23 AM  Result Value Ref Range   Glucose-Capillary 260 (H) 70 - 99 mg/dL  Magnesium      Status: None   Collection Time: 12/10/23  3:22 AM  Result Value Ref  Range   Magnesium  1.7 1.7 - 2.4 mg/dL  Comprehensive metabolic panel with GFR     Status: Abnormal   Collection Time: 12/10/23  3:22 AM  Result Value Ref Range   Sodium 131 (L) 135 - 145 mmol/L   Potassium 3.1 (L) 3.5 - 5.1 mmol/L   Chloride 99 98 - 111 mmol/L   CO2 24 22 - 32 mmol/L   Glucose, Bld 248 (H) 70 - 99 mg/dL   BUN 6 (L) 8 - 23 mg/dL   Creatinine, Ser 9.23 0.44 - 1.00 mg/dL   Calcium  8.2 (L) 8.9 - 10.3 mg/dL   Total Protein 6.6 6.5 - 8.1 g/dL   Albumin 2.5 (L) 3.5 - 5.0 g/dL   AST 72 (H) 15 - 41 U/L   ALT 220 (H) 0 - 44 U/L   Alkaline Phosphatase 433 (H) 38 - 126 U/L   Total Bilirubin 5.9 (H) 0.0 - 1.2 mg/dL   GFR, Estimated >39 >39 mL/min   Anion gap 8 5 - 15  Glucose, capillary     Status: Abnormal   Collection Time: 12/10/23  4:25 AM  Result Value Ref Range   Glucose-Capillary 242 (H) 70 - 99 mg/dL  Glucose, capillary     Status: Abnormal   Collection Time: 12/10/23  8:46 AM  Result Value Ref Range   Glucose-Capillary 375 (H) 70 - 99 mg/dL   Comment 1 Notify RN    Comment  2 Document in Chart   Glucose, capillary     Status: Abnormal   Collection Time: 12/10/23 12:16 PM  Result Value Ref Range   Glucose-Capillary 251 (H) 70 - 99 mg/dL   Comment 1 Notify RN    Comment 2 Document in Chart     I have reviewed pertinent nursing notes, vitals, labs, and images as necessary. I have ordered labwork to follow up on as indicated.  I have reviewed the last notes from staff over past 24 hours. I have discussed patient's care plan and test results with nursing staff, CM/SW, and other staff as appropriate.  Time spent: Greater than 50% of the 55 minute visit was spent in counseling/coordination of care for the patient as laid out in the A&P.   LOS: 3 days   Alm Apo, MD [facility] 12/10/2023, 2:13 PM

## 2023-12-10 NOTE — Care Management Important Message (Signed)
 Important Message  Patient Details  Name: Lindsay Hardy MRN: [id number] Date of Birth: January 04, 1951   Important Message Given:  Yes - Medicare IM     Claretta Deed 12/10/2023, 12:52 PM

## 2023-12-10 NOTE — Plan of Care (Signed)
  Problem: Education: Goal: Ability to describe self-care measures that may prevent or decrease complications (Diabetes Survival Skills Education) will improve Outcome: Progressing   Problem: Education: Goal: Ability to describe self-care measures that may prevent or decrease complications (Diabetes Survival Skills Education) will improve Outcome: Progressing Goal: Individualized Educational Video(s) Outcome: Progressing   Problem: Coping: Goal: Ability to adjust to condition or change in health will improve Outcome: Progressing   Problem: Fluid Volume: Goal: Ability to maintain a balanced intake and output will improve Outcome: Progressing

## 2023-12-11 DIAGNOSIS — K831 Obstruction of bile duct: Secondary | ICD-10-CM | POA: Diagnosis not present

## 2023-12-11 DIAGNOSIS — C259 Malignant neoplasm of pancreas, unspecified: Secondary | ICD-10-CM | POA: Diagnosis not present

## 2023-12-11 LAB — COMPREHENSIVE METABOLIC PANEL WITH GFR
ALT: 191 U/L — ABNORMAL HIGH (ref 0–44)
AST: 79 U/L — ABNORMAL HIGH (ref 15–41)
Albumin: 2.6 g/dL — ABNORMAL LOW (ref 3.5–5.0)
Alkaline Phosphatase: 356 U/L — ABNORMAL HIGH (ref 38–126)
Anion gap: 10 (ref 5–15)
BUN: 7 mg/dL — ABNORMAL LOW (ref 8–23)
CO2: 23 mmol/L (ref 22–32)
Calcium: 8.5 mg/dL — ABNORMAL LOW (ref 8.9–10.3)
Chloride: 99 mmol/L (ref 98–111)
Creatinine, Ser: 0.92 mg/dL (ref 0.44–1.00)
GFR, Estimated: >60 mL/min (ref >60)
Glucose, Bld: 290 mg/dL — ABNORMAL HIGH (ref 70–99)
Potassium: 3.7 mmol/L (ref 3.5–5.1)
Sodium: 132 mmol/L — ABNORMAL LOW (ref 135–145)
Total Bilirubin: 5.2 mg/dL — ABNORMAL HIGH (ref 0.0–1.2)
Total Protein: 6.5 g/dL (ref 6.5–8.1)

## 2023-12-11 LAB — CANCER ANTIGEN 19-9: CA 19-9: <2 U/mL (ref 0–35)

## 2023-12-11 LAB — BASIC METABOLIC PANEL WITH GFR
Anion gap: 11 (ref 5–15)
BUN: 7 mg/dL — ABNORMAL LOW (ref 8–23)
CO2: 23 mmol/L (ref 22–32)
Calcium: 8.7 mg/dL — ABNORMAL LOW (ref 8.9–10.3)
Chloride: 95 mmol/L — ABNORMAL LOW (ref 98–111)
Creatinine, Ser: 0.87 mg/dL (ref 0.44–1.00)
GFR, Estimated: >60 mL/min (ref >60)
Glucose, Bld: 472 mg/dL — ABNORMAL HIGH (ref 70–99)
Potassium: 4.1 mmol/L (ref 3.5–5.1)
Sodium: 129 mmol/L — ABNORMAL LOW (ref 135–145)

## 2023-12-11 LAB — GLUCOSE, CAPILLARY
Glucose-Capillary: 181 mg/dL — ABNORMAL HIGH (ref 70–99)
Glucose-Capillary: 201 mg/dL — ABNORMAL HIGH (ref 70–99)
Glucose-Capillary: 228 mg/dL — ABNORMAL HIGH (ref 70–99)
Glucose-Capillary: 312 mg/dL — ABNORMAL HIGH (ref 70–99)
Glucose-Capillary: 315 mg/dL — ABNORMAL HIGH (ref 70–99)
Glucose-Capillary: 423 mg/dL — ABNORMAL HIGH (ref 70–99)

## 2023-12-11 LAB — MAGNESIUM: Magnesium: 1.6 mg/dL — ABNORMAL LOW (ref 1.7–2.4)

## 2023-12-11 MED ORDER — MAGNESIUM SULFATE 2 GM/50ML IV SOLN
2.0000 g | Freq: Once | INTRAVENOUS | Status: AC
  Administered 2023-12-11: 2 g via INTRAVENOUS
  Filled 2023-12-11: qty 50

## 2023-12-11 MED ORDER — LACTULOSE 10 GM/15ML PO SOLN
30.0000 g | Freq: Two times a day (BID) | ORAL | Status: DC
  Administered 2023-12-11 – 2023-12-12 (×2): 30 g via ORAL
  Filled 2023-12-11 (×3): qty 45

## 2023-12-11 MED ORDER — INSULIN ASPART 100 UNIT/ML IJ SOLN
0.0000 [IU] | Freq: Three times a day (TID) | INTRAMUSCULAR | Status: DC
  Administered 2023-12-12: 8 [IU] via SUBCUTANEOUS
  Administered 2023-12-12: 11 [IU] via SUBCUTANEOUS
  Administered 2023-12-12: 8 [IU] via SUBCUTANEOUS
  Administered 2023-12-13 (×2): 3 [IU] via SUBCUTANEOUS

## 2023-12-11 MED ORDER — INSULIN ASPART 100 UNIT/ML IJ SOLN
0.0000 [IU] | Freq: Three times a day (TID) | INTRAMUSCULAR | Status: DC
  Administered 2023-12-11: 11 [IU] via SUBCUTANEOUS
  Administered 2023-12-11: 5 [IU] via SUBCUTANEOUS
  Administered 2023-12-11: 11 [IU] via SUBCUTANEOUS

## 2023-12-11 MED ORDER — HYDROMORPHONE HCL 1 MG/ML IJ SOLN
1.0000 mg | INTRAMUSCULAR | Status: DC | PRN
  Administered 2023-12-11 (×3): 1 mg via INTRAVENOUS
  Filled 2023-12-11 (×3): qty 1

## 2023-12-11 MED ORDER — INSULIN GLARGINE-YFGN 100 UNIT/ML ~~LOC~~ SOLN
20.0000 [IU] | Freq: Every day | SUBCUTANEOUS | Status: DC
  Administered 2023-12-11 – 2023-12-13 (×3): 20 [IU] via SUBCUTANEOUS
  Filled 2023-12-11 (×3): qty 0.2

## 2023-12-11 MED ORDER — INSULIN ASPART 100 UNIT/ML IJ SOLN
0.0000 [IU] | Freq: Every day | INTRAMUSCULAR | Status: DC
  Administered 2023-12-11: 5 [IU] via SUBCUTANEOUS
  Administered 2023-12-12: 4 [IU] via SUBCUTANEOUS

## 2023-12-11 MED ORDER — HYDROCODONE-ACETAMINOPHEN 10-325 MG PO TABS
1.0000 | ORAL_TABLET | ORAL | Status: DC | PRN
  Administered 2023-12-11 – 2023-12-12 (×3): 1 via ORAL
  Filled 2023-12-11 (×3): qty 1

## 2023-12-11 NOTE — Plan of Care (Signed)

## 2023-12-11 NOTE — Progress Notes (Signed)
 Progress Note    Lindsay Hardy   FMW:[id number]  DOB: 28-Oct-1950  DOA: 12/07/2023     4 PCP: Geofm Glade PARAS, MD  Initial CC: Abdominal pain  Hospital Course: Lindsay Hardy is a 73 y.o. female with PMH DMII, HTN who presented with ongoing abdominal pain.  She has had associated nausea as well.  She was undergoing evaluation outpatient with primary care with upcoming MRCP however due to persistent abdominal pain, she presented to the ER. Prior CT abdomen/pelvis from 10/28/2023 showed ill-defined hypoattenuation involving the pancreas.  This was again seen on repeat CT A/P on 6/26 along with biliary ductal dilatation noted on both scans. Ultimately, MRCP was performed on admission showing hypoenhancing mass involving inferior pancreatic head and uncinate process measuring 3.4 x 2.3 cm concerning for pancreatic adenocarcinoma.  There was also effacement of the pancreatic and CBD due to the mass with dilation of the pancreatic duct up to 8 mm.  CBD was dilated to 1.4 cm. GI was consulted on admission.  She underwent ERCP on 12/08/2023 and underwent biliary sphincterotomy along with placement of metal biliary stent into the CBD. Pathology returned consistent with pancreatic adenocarcinoma.  Interval History:  Tolerating regular diet.  CBGs elevated after diet change.  Insulin  regimen adjusted. Bigger problem is worsening abdominal pain uncontrolled.  Pain regimen adjusted also. Will keep her at least 1 more night while we make sure pain is adequately controlled prior to discharge  Assessment and Plan: * Pancreatic adenocarcinoma (HCC) - Initially noted on CT on 10/28/2023 but further imaging was necessary -Follow-up images have been performed since and ultimately she was to undergo MRI abdomen outpatient but presented due to persistent pain -MRCP notes 3.4 x 2.3 cm pancreatic mass concerning for pancreatic adenocarcinoma.  There is also associated pancreatic duct and CBD dilatation - now s/p ERCP  with GI on 7/2 with sphincterotomy and CBD stent placement - tolerating liquids post ERCP; diet advanced more today per patient request - Pathology returned consistent with adenocarcinoma; discussed with patient 12/10/23 -Staging CT chest negative for evidence of metastatic disease - Outpatient follow-up planned with Dr. Cloretta and/or for referral to surgery - Still having uncontrolled pain, 10/10 today; regimen adjusted.  Monitor overnight to see tolerance and control  Obstructive jaundice - Total bili 13.4 on admission with uptrend to 15.8 -Underwent sphincterotomy with CBD stent placement on 12/08/2023 - LFTs slowly improving  Hypokalemia - Replete as needed  Type 2 diabetes mellitus with hyperglycemia (HCC) - A1c 10.1% on 11/23/2023 -Continue SSI and CBG monitoring for now -At home on glipizide , Lantus  - CBGs elevating; will resume basal insulin  and modify sliding scale - Okay for continuing regular diet  Elevated LFTs - see above  Essential hypertension - Continue amlodipine  - Continue pain control  Microcytic anemia - Presumed in setting of underlying pancreatic malignancy - B12 adequate 1,603 - Folate 9.6 - Iron stores normal.  Ferritin mildly high as expected in setting of underlying presumed malignancy and/or reactive    Old records reviewed in assessment of this patient  Antimicrobials:   DVT prophylaxis:  SCDs Start: 12/08/23 0034   Code Status:   Code Status: Full Code  Mobility Assessment (Last 72 Hours)     Mobility Assessment     Row Name 12/11/23 0746 12/10/23 2200 12/10/23 1053 12/09/23 1946 12/09/23 0816   Does patient have an order for bedrest or is patient medically unstable No - Continue assessment No - Continue assessment No - Continue assessment No - Continue  assessment No - Continue assessment   What is the highest level of mobility based on the progressive mobility assessment? Level 5 (Walks with assist in room/hall) - Balance while stepping  forward/back and can walk in room with assist - Complete Level 5 (Walks with assist in room/hall) - Balance while stepping forward/back and can walk in room with assist - Complete Level 5 (Walks with assist in room/hall) - Balance while stepping forward/back and can walk in room with assist - Complete Level 5 (Walks with assist in room/hall) - Balance while stepping forward/back and can walk in room with assist - Complete Level 5 (Walks with assist in room/hall) - Balance while stepping forward/back and can walk in room with assist - Complete   Is the above level different from baseline mobility prior to current illness? -- No - Consider discontinuing PT/OT -- No - Consider discontinuing PT/OT No - Consider discontinuing PT/OT    Row Name 12/08/23 1937           Does patient have an order for bedrest or is patient medically unstable No - Continue assessment       What is the highest level of mobility based on the progressive mobility assessment? Level 5 (Walks with assist in room/hall) - Balance while stepping forward/back and can walk in room with assist - Complete       Is the above level different from baseline mobility prior to current illness? No - Consider discontinuing PT/OT          Barriers to discharge: none Disposition Plan:  Home HH orders placed: n/a Status is: Inpt  Objective: Blood pressure 114/69, pulse 99, temperature 98.2 F (36.8 C), resp. rate 16, height 5' 5 (1.651 m), weight 111.3 kg, SpO2 98%.  Examination:  Physical Exam Constitutional:      Appearance: Normal appearance.  HENT:     Head: Normocephalic and atraumatic.     Mouth/Throat:     Mouth: Mucous membranes are moist.  Eyes:     Extraocular Movements: Extraocular movements intact.  Cardiovascular:     Rate and Rhythm: Normal rate and regular rhythm.  Pulmonary:     Effort: Pulmonary effort is normal. No respiratory distress.     Breath sounds: Normal breath sounds. No wheezing.  Abdominal:      General: Bowel sounds are normal. There is no distension.     Palpations: Abdomen is soft.     Tenderness: There is no abdominal tenderness.  Musculoskeletal:        General: Normal range of motion.     Cervical back: Normal range of motion and neck supple.  Skin:    General: Skin is warm and dry.  Neurological:     General: No focal deficit present.     Mental Status: She is alert.  Psychiatric:        Mood and Affect: Mood normal.      Consultants:  GI  Procedures:  7/2: ERCP  Data Reviewed: Results for orders placed or performed during the hospital encounter of 12/07/23 (from the past 24 hours)  Glucose, capillary     Status: Abnormal   Collection Time: 12/10/23  4:25 PM  Result Value Ref Range   Glucose-Capillary 387 (H) 70 - 99 mg/dL  Glucose, capillary     Status: Abnormal   Collection Time: 12/10/23  7:57 PM  Result Value Ref Range   Glucose-Capillary 382 (H) 70 - 99 mg/dL  Glucose, capillary     Status: Abnormal  Collection Time: 12/11/23 12:20 AM  Result Value Ref Range   Glucose-Capillary 228 (H) 70 - 99 mg/dL  Glucose, capillary     Status: Abnormal   Collection Time: 12/11/23  4:08 AM  Result Value Ref Range   Glucose-Capillary 181 (H) 70 - 99 mg/dL  Magnesium      Status: Abnormal   Collection Time: 12/11/23  8:11 AM  Result Value Ref Range   Magnesium  1.6 (L) 1.7 - 2.4 mg/dL  Comprehensive metabolic panel with GFR     Status: Abnormal   Collection Time: 12/11/23  8:11 AM  Result Value Ref Range   Sodium 132 (L) 135 - 145 mmol/L   Potassium 3.7 3.5 - 5.1 mmol/L   Chloride 99 98 - 111 mmol/L   CO2 23 22 - 32 mmol/L   Glucose, Bld 290 (H) 70 - 99 mg/dL   BUN 7 (L) 8 - 23 mg/dL   Creatinine, Ser 9.07 0.44 - 1.00 mg/dL   Calcium  8.5 (L) 8.9 - 10.3 mg/dL   Total Protein 6.5 6.5 - 8.1 g/dL   Albumin 2.6 (L) 3.5 - 5.0 g/dL   AST 79 (H) 15 - 41 U/L   ALT 191 (H) 0 - 44 U/L   Alkaline Phosphatase 356 (H) 38 - 126 U/L   Total Bilirubin 5.2 (H) 0.0 - 1.2  mg/dL   GFR, Estimated >39 >39 mL/min   Anion gap 10 5 - 15  Glucose, capillary     Status: Abnormal   Collection Time: 12/11/23  8:13 AM  Result Value Ref Range   Glucose-Capillary 312 (H) 70 - 99 mg/dL  Glucose, capillary     Status: Abnormal   Collection Time: 12/11/23 11:27 AM  Result Value Ref Range   Glucose-Capillary 315 (H) 70 - 99 mg/dL    I have reviewed pertinent nursing notes, vitals, labs, and images as necessary. I have ordered labwork to follow up on as indicated.  I have reviewed the last notes from staff over past 24 hours. I have discussed patient's care plan and test results with nursing staff, CM/SW, and other staff as appropriate.  Time spent: Greater than 50% of the 55 minute visit was spent in counseling/coordination of care for the patient as laid out in the A&P.   LOS: 4 days   Alm Apo, MD [facility] 12/11/2023, 2:59 PM

## 2023-12-12 ENCOUNTER — Encounter (HOSPITAL_COMMUNITY): Payer: Self-pay | Admitting: Internal Medicine

## 2023-12-12 DIAGNOSIS — C259 Malignant neoplasm of pancreas, unspecified: Secondary | ICD-10-CM | POA: Diagnosis not present

## 2023-12-12 DIAGNOSIS — K831 Obstruction of bile duct: Secondary | ICD-10-CM | POA: Diagnosis not present

## 2023-12-12 LAB — COMPREHENSIVE METABOLIC PANEL WITH GFR
ALT: 162 U/L — ABNORMAL HIGH (ref 0–44)
AST: 65 U/L — ABNORMAL HIGH (ref 15–41)
Albumin: 2.4 g/dL — ABNORMAL LOW (ref 3.5–5.0)
Alkaline Phosphatase: 309 U/L — ABNORMAL HIGH (ref 38–126)
Anion gap: 9 (ref 5–15)
BUN: 7 mg/dL — ABNORMAL LOW (ref 8–23)
CO2: 24 mmol/L (ref 22–32)
Calcium: 8.3 mg/dL — ABNORMAL LOW (ref 8.9–10.3)
Chloride: 96 mmol/L — ABNORMAL LOW (ref 98–111)
Creatinine, Ser: 0.81 mg/dL (ref 0.44–1.00)
GFR, Estimated: >60 mL/min (ref >60)
Glucose, Bld: 349 mg/dL — ABNORMAL HIGH (ref 70–99)
Potassium: 3.8 mmol/L (ref 3.5–5.1)
Sodium: 129 mmol/L — ABNORMAL LOW (ref 135–145)
Total Bilirubin: 4.1 mg/dL — ABNORMAL HIGH (ref 0.0–1.2)
Total Protein: 5.9 g/dL — ABNORMAL LOW (ref 6.5–8.1)

## 2023-12-12 LAB — GLUCOSE, CAPILLARY
Glucose-Capillary: 398 mg/dL — ABNORMAL HIGH (ref 70–99)
Glucose-Capillary: 273 mg/dL — ABNORMAL HIGH (ref 70–99)
Glucose-Capillary: 298 mg/dL — ABNORMAL HIGH (ref 70–99)
Glucose-Capillary: 314 mg/dL — ABNORMAL HIGH (ref 70–99)
Glucose-Capillary: 325 mg/dL — ABNORMAL HIGH (ref 70–99)

## 2023-12-12 LAB — MAGNESIUM: Magnesium: 2 mg/dL (ref 1.7–2.4)

## 2023-12-12 MED ORDER — HYDROMORPHONE HCL 1 MG/ML IJ SOLN
1.0000 mg | Freq: Once | INTRAMUSCULAR | Status: AC
  Administered 2023-12-12: 1 mg via SUBCUTANEOUS
  Filled 2023-12-12: qty 1

## 2023-12-12 MED ORDER — HYDROCODONE-ACETAMINOPHEN 10-325 MG PO TABS
1.0000 | ORAL_TABLET | ORAL | Status: DC | PRN
  Administered 2023-12-12 – 2023-12-13 (×4): 2 via ORAL
  Filled 2023-12-12 (×4): qty 2

## 2023-12-12 MED ORDER — METFORMIN HCL ER 500 MG PO TB24
500.0000 mg | ORAL_TABLET | Freq: Two times a day (BID) | ORAL | Status: DC
  Administered 2023-12-12 – 2023-12-13 (×3): 500 mg via ORAL
  Filled 2023-12-12 (×5): qty 1

## 2023-12-12 NOTE — Progress Notes (Signed)
 Progress Note    Lindsay Hardy   FMW:[id number]  DOB: 1950-11-11  DOA: 12/07/2023     5 PCP: Geofm Glade PARAS, MD  Initial CC: Abdominal pain  Hospital Course: Lindsay Hardy is a 73 y.o. female with PMH DMII, HTN who presented with ongoing abdominal pain.  She has had associated nausea as well.  She was undergoing evaluation outpatient with primary care with upcoming MRCP however due to persistent abdominal pain, she presented to the ER. Prior CT abdomen/pelvis from 10/28/2023 showed ill-defined hypoattenuation involving the pancreas.  This was again seen on repeat CT A/P on 6/26 along with biliary ductal dilatation noted on both scans. Ultimately, MRCP was performed on admission showing hypoenhancing mass involving inferior pancreatic head and uncinate process measuring 3.4 x 2.3 cm concerning for pancreatic adenocarcinoma.  There was also effacement of the pancreatic and CBD due to the mass with dilation of the pancreatic duct up to 8 mm.  CBD was dilated to 1.4 cm. GI was consulted on admission.  She underwent ERCP on 12/08/2023 and underwent biliary sphincterotomy along with placement of metal biliary stent into the CBD. Pathology returned consistent with pancreatic adenocarcinoma.  Interval History:  Continues to tolerate diet.  Modified to diabetic diet overnight due to hyperglycemia.  Pain a little bit better today and we are modifying oral regimen further to keep her off of IVs if possible.   Assessment and Plan: * Pancreatic adenocarcinoma (HCC) - Initially noted on CT on 10/28/2023 but further imaging was necessary -Follow-up images have been performed since and ultimately she was to undergo MRI abdomen outpatient but presented due to persistent pain -MRCP notes 3.4 x 2.3 cm pancreatic mass concerning for pancreatic adenocarcinoma.  There is also associated pancreatic duct and CBD dilatation - now s/p ERCP with GI on 7/2 with sphincterotomy and CBD stent placement - tolerating liquids  post ERCP; diet advanced more today per patient request - Pathology returned consistent with adenocarcinoma; discussed with patient 12/10/23 -Staging CT chest negative for evidence of metastatic disease - Outpatient follow-up planned with Dr. Cloretta and/or for referral to surgery - Pain a little bit better today.  Modifying her oral regimen to help keep her off of IV.  Tentative discharge tomorrow if tolerating well still  Obstructive jaundice - Total bili 13.4 on admission with uptrend to 15.8 -Underwent sphincterotomy with CBD stent placement on 12/08/2023 - LFTs slowly improving  Hypokalemia - Replete as needed  Type 2 diabetes mellitus with hyperglycemia (HCC) - A1c 10.1% on 11/23/2023 -Continue SSI and CBG monitoring for now -At home on glipizide , Lantus  - CBGs elevating; will resume basal insulin  and modify sliding scale - Diet changed to carb consistent overnight  Elevated LFTs - see above  Essential hypertension - Continue amlodipine  - Continue pain control  Microcytic anemia - Presumed in setting of underlying pancreatic malignancy - B12 adequate 1,603 - Folate 9.6 - Iron stores normal.  Ferritin mildly high as expected in setting of underlying presumed malignancy and/or reactive    Old records reviewed in assessment of this patient  Antimicrobials:   DVT prophylaxis:  SCDs Start: 12/08/23 0034   Code Status:   Code Status: Full Code  Mobility Assessment (Last 72 Hours)     Mobility Assessment     Row Name 12/12/23 0900 12/11/23 2000 12/11/23 0746 12/10/23 2200 12/10/23 1053   Does patient have an order for bedrest or is patient medically unstable No - Continue assessment No - Continue assessment No - Continue assessment  No - Continue assessment No - Continue assessment   What is the highest level of mobility based on the progressive mobility assessment? Level 5 (Walks with assist in room/hall) - Balance while stepping forward/back and can walk in room with  assist - Complete Level 5 (Walks with assist in room/hall) - Balance while stepping forward/back and can walk in room with assist - Complete Level 5 (Walks with assist in room/hall) - Balance while stepping forward/back and can walk in room with assist - Complete Level 5 (Walks with assist in room/hall) - Balance while stepping forward/back and can walk in room with assist - Complete Level 5 (Walks with assist in room/hall) - Balance while stepping forward/back and can walk in room with assist - Complete   Is the above level different from baseline mobility prior to current illness? -- No - Consider discontinuing PT/OT -- No - Consider discontinuing PT/OT --    Row Name 12/09/23 1946           Does patient have an order for bedrest or is patient medically unstable No - Continue assessment       What is the highest level of mobility based on the progressive mobility assessment? Level 5 (Walks with assist in room/hall) - Balance while stepping forward/back and can walk in room with assist - Complete       Is the above level different from baseline mobility prior to current illness? No - Consider discontinuing PT/OT          Barriers to discharge: none Disposition Plan:  Home HH orders placed: n/a Status is: Inpt  Objective: Blood pressure 137/73, pulse (!) 103, temperature 99.1 F (37.3 C), resp. rate 15, height 5' 5 (1.651 m), weight 111.3 kg, SpO2 99%.  Examination:  Physical Exam Constitutional:      Appearance: Normal appearance.  HENT:     Head: Normocephalic and atraumatic.     Mouth/Throat:     Mouth: Mucous membranes are moist.  Eyes:     Extraocular Movements: Extraocular movements intact.  Cardiovascular:     Rate and Rhythm: Normal rate and regular rhythm.  Pulmonary:     Effort: Pulmonary effort is normal. No respiratory distress.     Breath sounds: Normal breath sounds. No wheezing.  Abdominal:     General: Bowel sounds are normal. There is no distension.      Palpations: Abdomen is soft.     Tenderness: There is no abdominal tenderness.  Musculoskeletal:        General: Normal range of motion.     Cervical back: Normal range of motion and neck supple.  Skin:    General: Skin is warm and dry.  Neurological:     General: No focal deficit present.     Mental Status: She is alert.  Psychiatric:        Mood and Affect: Mood normal.      Consultants:  GI  Procedures:  7/2: ERCP  Data Reviewed: Results for orders placed or performed during the hospital encounter of 12/07/23 (from the past 24 hours)  Glucose, capillary     Status: Abnormal   Collection Time: 12/11/23 11:27 AM  Result Value Ref Range   Glucose-Capillary 315 (H) 70 - 99 mg/dL  Glucose, capillary     Status: Abnormal   Collection Time: 12/11/23  3:59 PM  Result Value Ref Range   Glucose-Capillary 201 (H) 70 - 99 mg/dL   Comment 1 Document in Chart   Glucose,  capillary     Status: Abnormal   Collection Time: 12/11/23  9:02 PM  Result Value Ref Range   Glucose-Capillary 423 (H) 70 - 99 mg/dL  Basic metabolic panel     Status: Abnormal   Collection Time: 12/11/23 10:29 PM  Result Value Ref Range   Sodium 129 (L) 135 - 145 mmol/L   Potassium 4.1 3.5 - 5.1 mmol/L   Chloride 95 (L) 98 - 111 mmol/L   CO2 23 22 - 32 mmol/L   Glucose, Bld 472 (H) 70 - 99 mg/dL   BUN 7 (L) 8 - 23 mg/dL   Creatinine, Ser 9.12 0.44 - 1.00 mg/dL   Calcium  8.7 (L) 8.9 - 10.3 mg/dL   GFR, Estimated >39 >39 mL/min   Anion gap 11 5 - 15  Glucose, capillary     Status: Abnormal   Collection Time: 12/12/23  1:20 AM  Result Value Ref Range   Glucose-Capillary 398 (H) 70 - 99 mg/dL  Magnesium      Status: None   Collection Time: 12/12/23  2:36 AM  Result Value Ref Range   Magnesium  2.0 1.7 - 2.4 mg/dL  Comprehensive metabolic panel with GFR     Status: Abnormal   Collection Time: 12/12/23  2:36 AM  Result Value Ref Range   Sodium 129 (L) 135 - 145 mmol/L   Potassium 3.8 3.5 - 5.1 mmol/L    Chloride 96 (L) 98 - 111 mmol/L   CO2 24 22 - 32 mmol/L   Glucose, Bld 349 (H) 70 - 99 mg/dL   BUN 7 (L) 8 - 23 mg/dL   Creatinine, Ser 9.18 0.44 - 1.00 mg/dL   Calcium  8.3 (L) 8.9 - 10.3 mg/dL   Total Protein 5.9 (L) 6.5 - 8.1 g/dL   Albumin 2.4 (L) 3.5 - 5.0 g/dL   AST 65 (H) 15 - 41 U/L   ALT 162 (H) 0 - 44 U/L   Alkaline Phosphatase 309 (H) 38 - 126 U/L   Total Bilirubin 4.1 (H) 0.0 - 1.2 mg/dL   GFR, Estimated >39 >39 mL/min   Anion gap 9 5 - 15  Glucose, capillary     Status: Abnormal   Collection Time: 12/12/23  8:37 AM  Result Value Ref Range   Glucose-Capillary 273 (H) 70 - 99 mg/dL    I have reviewed pertinent nursing notes, vitals, labs, and images as necessary. I have ordered labwork to follow up on as indicated.  I have reviewed the last notes from staff over past 24 hours. I have discussed patient's care plan and test results with nursing staff, CM/SW, and other staff as appropriate.  Time spent: Greater than 50% of the 55 minute visit was spent in counseling/coordination of care for the patient as laid out in the A&P.   LOS: 5 days   Alm Apo, MD [facility] 12/12/2023, 11:25 AM

## 2023-12-12 NOTE — Plan of Care (Signed)

## 2023-12-12 NOTE — Plan of Care (Signed)
  Problem: Coping: Goal: Ability to adjust to condition or change in health will improve 12/12/2023 1825 by Ebbie Docia CROME, RN Outcome: Progressing 12/12/2023 1825 by Ebbie Docia CROME, RN Outcome: Progressing   Problem: Education: Goal: Knowledge of General Education information will improve Description: Including pain rating scale, medication(s)/side effects and non-pharmacologic comfort measures 12/12/2023 1825 by Ebbie Docia CROME, RN Outcome: Progressing 12/12/2023 1825 by Ebbie Docia CROME, RN Outcome: Progressing   Problem: Pain Managment: Goal: General experience of comfort will improve and/or be controlled 12/12/2023 1825 by Ebbie Docia CROME, RN Outcome: Progressing 12/12/2023 1825 by Ebbie Docia CROME, RN Outcome: Progressing   Problem: Safety: Goal: Ability to remain free from injury will improve 12/12/2023 1825 by Ebbie Docia CROME, RN Outcome: Progressing 12/12/2023 1825 by Ebbie Docia CROME, RN Outcome: Progressing

## 2023-12-13 ENCOUNTER — Other Ambulatory Visit (HOSPITAL_COMMUNITY): Payer: Self-pay

## 2023-12-13 DIAGNOSIS — K831 Obstruction of bile duct: Secondary | ICD-10-CM | POA: Diagnosis not present

## 2023-12-13 DIAGNOSIS — C259 Malignant neoplasm of pancreas, unspecified: Secondary | ICD-10-CM | POA: Diagnosis not present

## 2023-12-13 DIAGNOSIS — E876 Hypokalemia: Secondary | ICD-10-CM | POA: Diagnosis not present

## 2023-12-13 LAB — COMPREHENSIVE METABOLIC PANEL WITH GFR
ALT: 147 U/L — ABNORMAL HIGH (ref 0–44)
AST: 70 U/L — ABNORMAL HIGH (ref 15–41)
Albumin: 2.4 g/dL — ABNORMAL LOW (ref 3.5–5.0)
Alkaline Phosphatase: 319 U/L — ABNORMAL HIGH (ref 38–126)
Anion gap: 10 (ref 5–15)
BUN: 7 mg/dL — ABNORMAL LOW (ref 8–23)
CO2: 23 mmol/L (ref 22–32)
Calcium: 8.5 mg/dL — ABNORMAL LOW (ref 8.9–10.3)
Chloride: 100 mmol/L (ref 98–111)
Creatinine, Ser: 0.75 mg/dL (ref 0.44–1.00)
GFR, Estimated: >60 mL/min (ref >60)
Glucose, Bld: 153 mg/dL — ABNORMAL HIGH (ref 70–99)
Potassium: 3.6 mmol/L (ref 3.5–5.1)
Sodium: 133 mmol/L — ABNORMAL LOW (ref 135–145)
Total Bilirubin: 3.7 mg/dL — ABNORMAL HIGH (ref 0.0–1.2)
Total Protein: 5.8 g/dL — ABNORMAL LOW (ref 6.5–8.1)

## 2023-12-13 LAB — MAGNESIUM: Magnesium: 1.8 mg/dL (ref 1.7–2.4)

## 2023-12-13 LAB — GLUCOSE, CAPILLARY
Glucose-Capillary: 178 mg/dL — ABNORMAL HIGH (ref 70–99)
Glucose-Capillary: 188 mg/dL — ABNORMAL HIGH (ref 70–99)
Glucose-Capillary: 190 mg/dL — ABNORMAL HIGH (ref 70–99)
Glucose-Capillary: 234 mg/dL — ABNORMAL HIGH (ref 70–99)

## 2023-12-13 MED ORDER — HYDROCODONE-ACETAMINOPHEN 10-325 MG PO TABS
1.0000 | ORAL_TABLET | ORAL | 0 refills | Status: DC | PRN
  Filled 2023-12-13: qty 60, 5d supply, fill #0

## 2023-12-13 NOTE — Discharge Summary (Signed)
 Physician Discharge Summary   Lindsay Hardy FMW:[id number] DOB: 1951/01/29 DOA: 12/07/2023  PCP: Geofm Glade PARAS, MD  Admit date: 12/07/2023 Discharge date: 12/13/2023   Admitted From: Home Disposition:  Home Discharging physician: Alm Apo, MD Barriers to discharge: none  Recommendations at discharge: Repeat LFTs Referral placed to palliative care also for symptom management (pain and anxiety) Needs referral to surgery if indicated    Discharge Condition: stable CODE STATUS: Full  Diet recommendation:  Diet Orders (From admission, onward)     Start     Ordered   12/13/23 0000  Diet Carb Modified        12/13/23 1131   12/11/23 2209  Diet Carb Modified Fluid consistency: Thin; Room service appropriate? Yes  Diet effective now       Question Answer Comment  Diet-HS Snack? Nothing   Calorie Level Medium 1600-2000   Fluid consistency: Thin   Room service appropriate? Yes      12/11/23 2208            Hospital Course: Lindsay Hardy is a 73 y.o. female with PMH DMII, HTN who presented with ongoing abdominal pain.  She has had associated nausea as well.  She was undergoing evaluation outpatient with primary care with upcoming MRCP however due to persistent abdominal pain, she presented to the ER. Prior CT abdomen/pelvis from 10/28/2023 showed ill-defined hypoattenuation involving the pancreas.  This was again seen on repeat CT A/P on 6/26 along with biliary ductal dilatation noted on both scans. Ultimately, MRCP was performed on admission showing hypoenhancing mass involving inferior pancreatic head and uncinate process measuring 3.4 x 2.3 cm concerning for pancreatic adenocarcinoma.  There was also effacement of the pancreatic and CBD due to the mass with dilation of the pancreatic duct up to 8 mm.  CBD was dilated to 1.4 cm. GI was consulted on admission.  She underwent ERCP on 12/08/2023 and underwent biliary sphincterotomy along with placement of metal biliary stent into the  CBD. Pathology returned consistent with pancreatic adenocarcinoma. Follow-up as scheduled with oncology at discharge with tentative plans for surgery referral regarding discussion of resection/Whipple procedure.  Palliative care referral also placed for symptom management.  Patient typically follows with pain management for chronic back pain however in setting of abdominal pain with new diagnosis, she may be better suited for continuing care with palliative care for pain management along with some anxiety she is also beginning to experience regarding diagnosis.  Assessment and Plan: * Pancreatic adenocarcinoma (HCC) - Initially noted on CT on 10/28/2023 but further imaging was necessary -Follow-up images have been performed since and ultimately she was to undergo MRI abdomen outpatient but presented due to persistent pain -MRCP notes 3.4 x 2.3 cm pancreatic mass concerning for pancreatic adenocarcinoma.  There is also associated pancreatic duct and CBD dilatation - now s/p ERCP with GI on 7/2 with sphincterotomy and CBD stent placement - tolerating liquids post ERCP; diet advanced more today per patient request - Pathology returned consistent with adenocarcinoma; discussed with patient 12/10/23 -Staging CT chest negative for evidence of metastatic disease - Outpatient follow-up planned with Dr. Cloretta and/or for referral to surgery - Pain a little bit better today.  Continued on Norco at discharge and palliative care referral placed for assistance with symptom management long-term  Obstructive jaundice - Total bili 13.4 on admission with uptrend to 15.8 -Underwent sphincterotomy with CBD stent placement on 12/08/2023 - LFTs slowly improving  Hypokalemia - Repleted  Type 2 diabetes mellitus with hyperglycemia (  HCC) - A1c 10.1% on 11/23/2023 -Continue SSI and CBG monitoring for now -At home on glipizide , Lantus  - CBGs elevating; will resume basal insulin  and modify sliding scale - Diet  changed to carb consistent  Elevated LFTs - see above  Essential hypertension - Continue amlodipine  - Continue pain control  Microcytic anemia - Presumed in setting of underlying pancreatic malignancy - B12 adequate 1,603 - Folate 9.6 - Iron stores normal.  Ferritin mildly high as expected in setting of underlying presumed malignancy and/or reactive       The patient's acute and chronic medical conditions were treated accordingly. On day of discharge, patient was felt deemed stable for discharge. Patient/family member advised to call PCP or come back to ER if needed.   Principal Diagnosis: Pancreatic adenocarcinoma [facility])  Discharge Diagnoses: Active Hospital Problems   Diagnosis Date Noted   Pancreatic adenocarcinoma (HCC) 12/07/2023    Priority: 1.   Obstructive jaundice 12/08/2023    Priority: 2.   Hypokalemia 04/24/2007    Priority: 3.   Type 2 diabetes mellitus with hyperglycemia (HCC) 12/08/2023    Priority: 4.   Elevated LFTs 12/08/2023   Microcytic anemia 06/27/2006   Essential hypertension 06/17/2006    Resolved Hospital Problems  No resolved problems to display.     Discharge Instructions     Amb Referral to Palliative Care   Complete by: As directed    Diet Carb Modified   Complete by: As directed    Increase activity slowly   Complete by: As directed       Allergies as of 12/13/2023       Reactions   Atorvastatin    REACTION: unable to tolerate. reason unknown   Clopidogrel Bisulfate    REACTION: weak, tired, sleepy, tightness (6/05); soreness in hip (11/05)   Crestor  [rosuvastatin  Calcium ]    Did not like how she felt on it - stomach upset, nausea   Fexofenadine    REACTION: intolerance unknown   Furosemide    REACTION: did not tolerate and preferred HCTZ   Gabapentin     sob   Hydrochlorothiazide -triamterene    REACTION: things crawled in her head and felt funny(10/04) ; h/a (3/07)   Lisinopril    REACTION: cough, right sided pain    Metoprolol Tartrate    REACTION: lightheadedness, h/a cough (7/04); heart gong to stop (7/06); sluggishnes and depressed feeling (2/07); weakness/heaviness 6/07)   Mometasone Furoate    REACTION: neck tenderness   Telmisartan     Shortness of breath, foot swelling   Ozempic  (0.25 Or 0.5 Mg-dose) [semaglutide (0.25 Or 0.5mg -dos)]    Blurry vision        Medication List     TAKE these medications    albuterol  108 (90 Base) MCG/ACT inhaler Commonly known as: VENTOLIN  HFA INHALE 2 PUFFS INTO THE LUNGS EVERY 4 (FOUR) HOURS AS NEEDED FOR SHORTNESS OF BREATH.   amLODipine  10 MG tablet Commonly known as: NORVASC  Take 1 tablet (10 mg total) by mouth daily.   cyanocobalamin  500 MCG tablet Commonly known as: VITAMIN B12 Take 500 mcg by mouth daily.   fluticasone  50 MCG/ACT nasal spray Commonly known as: FLONASE  Place 2 sprays into both nostrils daily.   glipiZIDE  10 MG tablet Commonly known as: GLUCOTROL  TAKE 2 TABLETS BY MOUTH 2 TIMES DAILY BEFORE A MEAL. What changed:  how much to take how to take this when to take this   hydrochlorothiazide  25 MG tablet Commonly known as: HYDRODIURIL  Take 1 tablet (25 mg total)  by mouth daily.   HYDROcodone -acetaminophen  10-325 MG tablet Commonly known as: NORCO Take 1-2 tablets by mouth every 4 (four) hours as needed for moderate pain (pain score 4-6) or severe pain (pain score 7-10). What changed: how much to take   ibuprofen  800 MG tablet Commonly known as: ADVIL  TAKE 1 TABLET BY MOUTH TWICE DAILY AS NEEDED FOR MODERATE PAIN   insulin  glargine 100 UNIT/ML Solostar Pen Commonly known as: LANTUS  Inject 20 Units into the skin daily.   linaclotide  145 MCG Caps capsule Commonly known as: Linzess  TAKE 1 CAPSULE BY MOUTH DAILY BEFORE BREAKFAST.   metFORMIN  500 MG 24 hr tablet Commonly known as: GLUCOPHAGE -XR TAKE 2 TABLETS BY MOUTH TWICE DAILY BEFORE A MEAL What changed:  how much to take how to take this when to take this    montelukast  10 MG tablet Commonly known as: SINGULAIR  TAKE 1 TABLET BY MOUTH EVRY DAY AT BEDTIME   naloxone  4 MG/0.1ML Liqd nasal spray kit Commonly known as: NARCAN  1 spray in one nostril x1; may repeat dose in alternate nostrils q2-3min prn   potassium chloride  10 MEQ CR capsule Commonly known as: MICRO-K  TAKE 1 CAPSULE BY MOUTH TWICE A DAY What changed:  when to take this reasons to take this        Allergies  Allergen Reactions   Atorvastatin     REACTION: unable to tolerate. reason unknown   Clopidogrel Bisulfate     REACTION: weak, tired, sleepy, tightness (6/05); soreness in hip (11/05)   Crestor  [Rosuvastatin  Calcium ]     Did not like how she felt on it - stomach upset, nausea   Fexofenadine     REACTION: intolerance unknown   Furosemide     REACTION: did not tolerate and preferred HCTZ   Gabapentin      sob   Hydrochlorothiazide -Triamterene     REACTION: things crawled in her head and felt funny(10/04) ; h/a (3/07)   Lisinopril     REACTION: cough, right sided pain   Metoprolol Tartrate     REACTION: lightheadedness, h/a cough (7/04); heart gong to stop (7/06); sluggishnes and depressed feeling (2/07); weakness/heaviness 6/07)   Mometasone Furoate     REACTION: neck tenderness   Telmisartan      Shortness of breath, foot swelling   Ozempic  (0.25 Or 0.5 Mg-Dose) [Semaglutide (0.25 Or 0.5mg -Dos)]     Blurry vision    Consultations: Oncology GI  Procedures: 12/08/2023: ERCP  Discharge Exam: BP 122/64 (BP Location: Right Arm)   Pulse (!) 104   Temp 99.5 F (37.5 C)   Resp 19   Ht 5' 5 (1.651 m)   Wt 111.3 kg   SpO2 94%   BMI 40.82 kg/m  Physical Exam Constitutional:      Appearance: Normal appearance.  HENT:     Head: Normocephalic and atraumatic.     Mouth/Throat:     Mouth: Mucous membranes are moist.  Eyes:     Extraocular Movements: Extraocular movements intact.  Cardiovascular:     Rate and Rhythm: Normal rate and regular rhythm.   Pulmonary:     Effort: Pulmonary effort is normal. No respiratory distress.     Breath sounds: Normal breath sounds. No wheezing.  Abdominal:     General: Bowel sounds are normal. There is no distension.     Palpations: Abdomen is soft.     Tenderness: There is no abdominal tenderness.  Musculoskeletal:        General: Normal range of motion.  Cervical back: Normal range of motion and neck supple.  Skin:    General: Skin is warm and dry.  Neurological:     General: No focal deficit present.     Mental Status: She is alert.  Psychiatric:        Mood and Affect: Mood normal.      The results of significant diagnostics from this hospitalization (including imaging, microbiology, ancillary and laboratory) are listed below for reference.   Microbiology: No results found for this or any previous visit (from the past 240 hours).   Labs: BNP (last 3 results) No results for input(s): BNP in the last 8760 hours. Basic Metabolic Panel: Recent Labs  Lab 12/09/23 0317 12/10/23 0322 12/11/23 0811 12/11/23 2229 12/12/23 0236 12/13/23 0502  NA 131* 131* 132* 129* 129* 133*  K 3.4* 3.1* 3.7 4.1 3.8 3.6  CL 95* 99 99 95* 96* 100  CO2 25 24 23 23 24 23   GLUCOSE 302* 248* 290* 472* 349* 153*  BUN 6* 6* 7* 7* 7* 7*  CREATININE 0.79 0.76 0.92 0.87 0.81 0.75  CALCIUM  8.5* 8.2* 8.5* 8.7* 8.3* 8.5*  MG 1.7 1.7 1.6*  --  2.0 1.8   Liver Function Tests: Recent Labs  Lab 12/09/23 0317 12/10/23 0322 12/11/23 0811 12/12/23 0236 12/13/23 0502  AST 150* 72* 79* 65* 70*  ALT 287* 220* 191* 162* 147*  ALKPHOS 513* 433* 356* 309* 319*  BILITOT 7.8* 5.9* 5.2* 4.1* 3.7*  PROT 6.6 6.6 6.5 5.9* 5.8*  ALBUMIN 2.4* 2.5* 2.6* 2.4* 2.4*   Recent Labs  Lab 12/07/23 1334  LIPASE 111*   No results for input(s): AMMONIA in the last 168 hours. CBC: Recent Labs  Lab 12/07/23 1334 12/08/23 0422 12/09/23 0317  WBC 6.2 6.4 8.0  NEUTROABS 4.5  --  6.2  HGB 11.2* 10.4* 10.8*  HCT 34.3*  31.0* 32.5*  MCV 75.6* 74.3* 75.1*  PLT 273 266 264   Cardiac Enzymes: No results for input(s): CKTOTAL, CKMB, CKMBINDEX, TROPONINI in the last 168 hours. BNP: Invalid input(s): POCBNP CBG: Recent Labs  Lab 12/12/23 2051 12/13/23 0018 12/13/23 0734 12/13/23 0831 12/13/23 1121  GLUCAP 325* 178* 188* 190* 234*   D-Dimer No results for input(s): DDIMER in the last 72 hours. Hgb A1c No results for input(s): HGBA1C in the last 72 hours. Lipid Profile No results for input(s): CHOL, HDL, LDLCALC, TRIG, CHOLHDL, LDLDIRECT in the last 72 hours. Thyroid  function studies No results for input(s): TSH, T4TOTAL, T3FREE, THYROIDAB in the last 72 hours.  Invalid input(s): FREET3 Anemia work up No results for input(s): VITAMINB12, FOLATE, FERRITIN, TIBC, IRON, RETICCTPCT in the last 72 hours. Urinalysis    Component Value Date/Time   COLORURINE AMBER (A) 12/07/2023 1359   APPEARANCEUR CLOUDY (A) 12/07/2023 1359   LABSPEC 1.034 (H) 12/07/2023 1359   PHURINE 5.0 12/07/2023 1359   GLUCOSEU 150 (A) 12/07/2023 1359   GLUCOSEU NEGATIVE 10/27/2023 1427   HGBUR MODERATE (A) 12/07/2023 1359   BILIRUBINUR MODERATE (A) 12/07/2023 1359   BILIRUBINUR negative 03/23/2023 1651   KETONESUR NEGATIVE 12/07/2023 1359   PROTEINUR 100 (A) 12/07/2023 1359   UROBILINOGEN 1.0 10/27/2023 1427   NITRITE NEGATIVE 12/07/2023 1359   LEUKOCYTESUR NEGATIVE 12/07/2023 1359   Sepsis Labs Recent Labs  Lab 12/07/23 1334 12/08/23 0422 12/09/23 0317  WBC 6.2 6.4 8.0   Microbiology No results found for this or any previous visit (from the past 240 hours).  Procedures/Studies: CT CHEST WO CONTRAST Result Date: 12/10/2023 CLINICAL  DATA:  MR imaging findings suggestive of pancreatic adenocarcinoma. Staging. * Tracking Code: BO * EXAM: CT CHEST WITHOUT CONTRAST TECHNIQUE: Multidetector CT imaging of the chest was performed following the standard protocol without IV  contrast. RADIATION DOSE REDUCTION: This exam was performed according to the departmental dose-optimization program which includes automated exposure control, adjustment of the mA and/or kV according to patient size and/or use of iterative reconstruction technique. COMPARISON:  11/24/2019 FINDINGS: Cardiovascular: The heart size is normal. No substantial pericardial effusion. Coronary artery calcification is evident. Mild atherosclerotic calcification is noted in the wall of the thoracic aorta. Mediastinum/Nodes: No mediastinal lymphadenopathy. No evidence for gross hilar lymphadenopathy although assessment is limited by the lack of intravenous contrast on the current study. The esophagus has normal imaging features. There is no axillary lymphadenopathy. Lungs/Pleura: Centrilobular emphsyema noted. Stable scarring in the suprahilar right lung apex. Calcified granulomata noted in the anterior right lung as before. Calcified granulomata also noted left upper lobe. No new suspicious pulmonary nodule or mass. No focal airspace consolidation. There is no evidence of pleural effusion. Upper Abdomen: Pneumobilia evident in this patient status post ERCP on 12/08/2023. Musculoskeletal: No worrisome lytic or sclerotic osseous abnormality. Degenerative changes are noted in both shoulders, right greater than left. IMPRESSION: 1. No evidence for metastatic disease in the chest. 2. Stable scarring in the suprahilar right lung apex. 3. Pneumobilia in this patient status post ERCP on 12/08/2023. 4. Emphysema (ICD10-J43.9) and Aortic Atherosclerosis (ICD10-170.0) Electronically Signed   By: Camellia Candle M.D.   On: 12/10/2023 05:06   DG ERCP Result Date: 12/09/2023 CLINICAL DATA:  Pancreatic mass and biliary obstruction. EXAM: ERCP TECHNIQUE: Multiple spot images obtained with the fluoroscopic device and submitted for interpretation post-procedure. COMPARISON:  MRI on 12/07/2023 FINDINGS: Imaging with a C-arm demonstrates  cannulation of the common bile duct with cholangiogram demonstrating high-grade stricture of the distal common bile duct. A self expanding metallic biliary stent was placed across the stricture. 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 Electronically Signed   By: Marcey Moan M.D.   On: 12/09/2023 08:55   MR ABDOMEN MRCP W WO CONTAST Result Date: 12/07/2023 CLINICAL DATA:  Abnormal pancreas by prior CT, neoplasm EXAM: MRI ABDOMEN WITHOUT AND WITH CONTRAST (INCLUDING MRCP) TECHNIQUE: Multiplanar multisequence MR imaging of the abdomen was performed both before and after the administration of intravenous contrast. Heavily T2-weighted images of the biliary and pancreatic ducts were obtained, and three-dimensional MRCP images were rendered by post processing. CONTRAST:  10mL GADAVIST  GADOBUTROL  1 MMOL/ML IV SOLN COMPARISON:  CT abdomen pelvis, 12/07/2023 FINDINGS: Lower chest: No acute abnormality. Hepatobiliary: No focal liver abnormality is seen. Status post cholecystectomy. Severe intra and extrahepatic biliary ductal dilatation, the common bile duct measuring up to 1.4 cm and abruptly effaced above the level of the ampulla by mass (series 2, image 14). Pancreas: Hypoenhancing mass in the inferior pancreatic head and uncinate measuring 3.4 x 2.3 cm (series 15, image 58). There is abrupt effacement of both the pancreatic and common bile ducts upstream dilatation pancreatic duct measuring up to 0.8 cm. Cystic change in the adjacent uncinate (series 5, image 31). Spleen: Normal in size without significant abnormality. Adrenals/Urinary Tract: Adrenal glands are unremarkable. Kidneys are normal, without renal calculi, solid lesion, or hydronephrosis. Stomach/Bowel: Stomach is within normal limits. No evidence of bowel wall thickening, distention, or inflammatory changes. Vascular/Lymphatic: Aortic atherosclerosis. Mass closely abuts the  central superior mesenteric vein anteriorly with  less than 50% contact (series 17, image 58). Fat planes are preserved to the superior mesenteric artery. No enlarged abdominal lymph nodes. Other: No abdominal wall hernia or abnormality. No ascites. Musculoskeletal: No acute or significant osseous findings. IMPRESSION: 1. Hypoenhancing mass in the inferior pancreatic head and uncinate measuring 3.4 x 2.3 cm, consistent with pancreatic adenocarcinoma. 2. There is abrupt effacement of both the pancreatic and common bile ducts by mass with upstream dilatation of the pancreatic duct measuring up to 0.8 cm. 3. Severe intra and extrahepatic biliary ductal dilatation, the common bile duct measuring up to 1.4 cm and as above, abruptly effaced above the level of the ampulla by mass. 4. Mass closely abuts the central superior mesenteric vein anteriorly with less than 50% contact. Fat planes are preserved to the superior mesenteric artery. 5. No evidence of lymphadenopathy or metastatic disease in the abdomen. Electronically Signed   By: Marolyn JONETTA Jaksch M.D.   On: 12/07/2023 18:55   MR 3D Recon At Scanner Result Date: 12/07/2023 CLINICAL DATA:  Abnormal pancreas by prior CT, neoplasm EXAM: MRI ABDOMEN WITHOUT AND WITH CONTRAST (INCLUDING MRCP) TECHNIQUE: Multiplanar multisequence MR imaging of the abdomen was performed both before and after the administration of intravenous contrast. Heavily T2-weighted images of the biliary and pancreatic ducts were obtained, and three-dimensional MRCP images were rendered by post processing. CONTRAST:  10mL GADAVIST  GADOBUTROL  1 MMOL/ML IV SOLN COMPARISON:  CT abdomen pelvis, 12/07/2023 FINDINGS: Lower chest: No acute abnormality. Hepatobiliary: No focal liver abnormality is seen. Status post cholecystectomy. Severe intra and extrahepatic biliary ductal dilatation, the common bile duct measuring up to 1.4 cm and abruptly effaced above the level of the ampulla by mass (series 2, image 14).  Pancreas: Hypoenhancing mass in the inferior pancreatic head and uncinate measuring 3.4 x 2.3 cm (series 15, image 58). There is abrupt effacement of both the pancreatic and common bile ducts upstream dilatation pancreatic duct measuring up to 0.8 cm. Cystic change in the adjacent uncinate (series 5, image 31). Spleen: Normal in size without significant abnormality. Adrenals/Urinary Tract: Adrenal glands are unremarkable. Kidneys are normal, without renal calculi, solid lesion, or hydronephrosis. Stomach/Bowel: Stomach is within normal limits. No evidence of bowel wall thickening, distention, or inflammatory changes. Vascular/Lymphatic: Aortic atherosclerosis. Mass closely abuts the central superior mesenteric vein anteriorly with less than 50% contact (series 17, image 58). Fat planes are preserved to the superior mesenteric artery. No enlarged abdominal lymph nodes. Other: No abdominal wall hernia or abnormality. No ascites. Musculoskeletal: No acute or significant osseous findings. IMPRESSION: 1. Hypoenhancing mass in the inferior pancreatic head and uncinate measuring 3.4 x 2.3 cm, consistent with pancreatic adenocarcinoma. 2. There is abrupt effacement of both the pancreatic and common bile ducts by mass with upstream dilatation of the pancreatic duct measuring up to 0.8 cm. 3. Severe intra and extrahepatic biliary ductal dilatation, the common bile duct measuring up to 1.4 cm and as above, abruptly effaced above the level of the ampulla by mass. 4. Mass closely abuts the central superior mesenteric vein anteriorly with less than 50% contact. Fat planes are preserved to the superior mesenteric artery. 5. No evidence of lymphadenopathy or metastatic disease in the abdomen. Electronically Signed   By: Marolyn JONETTA Jaksch M.D.   On: 12/07/2023 18:55   CT ABDOMEN PELVIS W CONTRAST Result Date: 12/07/2023 CLINICAL DATA:  Abdominal pain. EXAM: CT ABDOMEN AND PELVIS WITH CONTRAST TECHNIQUE: Multidetector CT imaging of the  abdomen and pelvis was performed using the standard protocol following bolus  administration of intravenous contrast. RADIATION DOSE REDUCTION: This exam was performed according to the departmental dose-optimization program which includes automated exposure control, adjustment of the mA and/or kV according to patient size and/or use of iterative reconstruction technique. CONTRAST:  75mL OMNIPAQUE  IOHEXOL  350 MG/ML SOLN COMPARISON:  CT abdomen pelvis dated 12/02/2023. FINDINGS: Lower chest: No acute abnormality. There is coronary vascular calcification. No intra-abdominal free air or free fluid. Hepatobiliary: The liver is unremarkable. There is dilatation of the intrahepatic and extrahepatic biliary tree. Cholecystectomy. No retained calcified stone noted in the central CBD. Pancreas: Ill-defined low attenuating area in the region of the uncinate process of the pancreas as seen on the prior CT which may represent pancreatitis but concerning for pancreatic neoplasm. Further evaluation with MRI/MRCP recommended. A small adjacent cyst again noted. There is mild atrophy of the pancreas and mild dilatation of the main pancreatic duct. Spleen: Normal in size without focal abnormality. Adrenals/Urinary Tract: The adrenal glands unremarkable. Mild bilateral renal parenchyma atrophy. There is no hydronephrosis on either side. There is symmetric enhancement and excretion of contrast by both kidneys. Similar appearance of a 1 cm anterior lower pole hypodense lesion in the left kidney. The visualized ureters and urinary bladder appear unremarkable. Stomach/Bowel: There is no bowel obstruction or active inflammation. The appendix is normal. Vascular/Lymphatic: Mild aortoiliac atherosclerotic disease. The IVC is unremarkable. No portal venous gas. There is no adenopathy. Reproductive: Calcified uterine fibroids. No suspicious adnexal masses. Other: None Musculoskeletal: Degenerative changes of the spine. No acute osseous  pathology. IMPRESSION: 1. Ill-defined low attenuating area in the region of the uncinate process of the pancreas as seen on the prior CT may represent pancreatitis but concerning for pancreatic neoplasm. Further evaluation with MRI/MRCP recommended. 2. No bowel obstruction. Normal appendix. 3.  Aortic Atherosclerosis (ICD10-I70.0). Electronically Signed   By: Vanetta Chou M.D.   On: 12/07/2023 15:29   US  Abdomen Limited RUQ (LIVER/GB) Result Date: 12/07/2023 CLINICAL DATA:  Abdominal pain. EXAM: ULTRASOUND ABDOMEN LIMITED RIGHT UPPER QUADRANT COMPARISON:  CT abdomen/pelvis dated 12/02/2023. FINDINGS: Evaluation is limited due to obscuration from overlying bowel gas and soft tissues. Gallbladder: Surgically absent. Common bile duct: Not visualized due to obscuration from overlying bowel gas and soft tissues. Known extrahepatic and intrahepatic biliary ductal dilation noted on the prior CT abdomen/pelvis dated 12/02/2023 is not well visualized on this exam. Liver: No appreciable abnormality. Portal vein is patent on color Doppler imaging with normal direction of blood flow towards the liver. Other: None. IMPRESSION: 1. Limited evaluation due to obscuration from overlying bowel gas and soft tissues. No acute sonographic abnormality identified. 2. Known extrahepatic and intrahepatic biliary ductal dilation noted on the prior CT abdomen/pelvis dated 12/02/2023 is not well visualized on this exam. Electronically Signed   By: Harrietta Sherry M.D.   On: 12/07/2023 14:51   CT ABDOMEN PELVIS W CONTRAST Result Date: 12/02/2023 CLINICAL DATA:  Follow-up of duodenal diverticulitis and hypodensity in the pancreatic uncinate process EXAM: CT ABDOMEN AND PELVIS WITH CONTRAST TECHNIQUE: Multidetector CT imaging of the abdomen and pelvis was performed using the standard protocol following bolus administration of intravenous contrast. RADIATION DOSE REDUCTION: This exam was performed according to the departmental  dose-optimization program which includes automated exposure control, adjustment of the mA and/or kV according to patient size and/or use of iterative reconstruction technique. CONTRAST:  ISOVUE -300 IOPAMIDOL  (ISOVUE -300) INJECTION 61% COMPARISON:  CT abdomen and pelvis dated 10/28/2023 FINDINGS: Lower chest: Scattered punctate calcified granulomata. No pleural effusion or pneumothorax demonstrated. Partially  imaged heart size is normal. Coronary artery calcifications. Hepatobiliary: No focal hepatic lesions. Mild intrahepatic duct bile duct dilation and common bile duct dilation measuring 13 mm, increased from 6 mm. Cholecystectomy. Pancreas: Increased main ductal dilation measuring 8 mm in the pancreatic head (2:33), previously 5 mm. Ill-defined area of hypoattenuation in the pancreatic head/uncinate process persists, measuring 2.9 x 1.9 cm (2:39), not substantially changed in size when remeasured. Adjacent cystic focus measures 1.9 x 1.6 cm (2:42), unchanged. Spleen: Normal in size without focal abnormality. Adrenals/Urinary Tract: No adrenal nodules. Incompletely characterized 1.5 cm hypodensity in the lower pole left kidney (2:40) and 1.5 cm hyperattenuating focus in the lower pole left kidney (2:47). No hydronephrosis or radiopaque renal stones. Additional subcentimeter bilateral hypodensities, too small to characterize. No focal bladder wall thickening. Stomach/Bowel: Small hiatal hernia. Normal appearance of the stomach. 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 No evidence of bowel wall thickening, distention, or inflammatory changes. Appendix is not discretely seen. Vascular/Lymphatic: Aortic atherosclerosis. The pancreatic head/uncinate mass abuts the SMV near the confluence with portal vein without substantial effacement (2:39). No definite arterial involvement. No enlarged abdominal or pelvic lymph nodes. Reproductive: No adnexal masses. 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 Other: No free fluid, fluid collection, or free air. Musculoskeletal: No acute or abnormal lytic or blastic osseous lesions. Multilevel degenerative changes of the partially imaged thoracic and lumbar spine. IMPRESSION: 1. Persistent ill-defined area of hypoattenuation in the pancreatic head/uncinate process suspicious for pancreatic ductal adenocarcinoma. Resulting increased main pancreatic ductal and intra and extrahepatic bile duct dilation. 2. 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 3. Incompletely characterized 1.5 cm hypodensity in the lower pole left kidney and 1.5 cm hyperattenuating focus in the lower pole left kidney. If an abdominal MRI is planned for evaluation of the pancreatic lesion, these findings can be evaluated at the same time. Alternatively, nonemergent renal protocol abdominal MRI or CT is recommended for further characterization. 4. Aortic Atherosclerosis (ICD10-I70.0). Electronically Signed   By: Limin  Xu M.D.   On: 12/02/2023 13:09     Time coordinating discharge: Over 30 minutes    Alm Apo, MD  [facility] 12/13/2023, 1:01 PM

## 2023-12-13 NOTE — Progress Notes (Signed)
 Reviewed AVS, patient expressed understanding of medications, MD follow up reviewed.   Removed IV, Site clean, dry and intact.  Patient states all belongings brought to the hospital at time of admission are accounted for and packed to take home.  Patient informed and expressed understanding to pick up medications from [facility] pharmacy.  Patient requested to take a shower before getting dressed. Informed RN.

## 2023-12-13 NOTE — Plan of Care (Signed)

## 2023-12-14 ENCOUNTER — Other Ambulatory Visit

## 2023-12-14 ENCOUNTER — Telehealth: Payer: Self-pay | Admitting: *Deleted

## 2023-12-14 NOTE — Patient Instructions (Signed)
 Visit Information  Thank you for taking time to visit with me today. Please don't hesitate to contact me if I can be of assistance to you before our next scheduled telephone appointment.  Our next appointment is by telephone on Tuesday 12/21/23 with nurse Barbie at 1:30 pm and then the following week on Tuesday 12/28/23 with nurse Akila Batta at 1:00 pm  Please call the care guide team at [phone] if you need to cancel or reschedule your appointment  Patient Self Care Activities:  Attend all scheduled provider appointments Call provider office for new concerns or questions  Participate in Transition of Care Program/Attend [facility] scheduled calls Take medications as prescribed   Please call Dr. Geofm' office to schedule a hospital follow up office visit with her as soon as possible: her number is [phone] Continue pacing activity as your recuperation from your recent hospital visit continues If you believe your condition is getting worse- contact your care providers (doctors) promptly- reaching out to your doctor early when you have concerns can prevent you from having to go to the hospital  Following is a copy of your care plan:   Goals Addressed             This Visit's Progress    VBCI Transitions of Care (TOC) Care Plan   On track    Problems:  Recent Hospitalization for treatment of Abdominal pain/ nausea- vomiting in setting of pancreatic cancer Limited social support: Reports my son helps as much as he can; I have a PCS aide that comes in every day of the week for a couple of hours each day  No Hospital Follow Up Provider appointment: 12/14/23: patient declined assistance in scheduling hospital follow up office visit with PCP: she wishes to schedule herself after she has new patient appointment with oncology provider on 12/16/23: verbalizes plans to attend oncology appointment as scheduled- reports son to provide transportation Hospitalized 07/01-05/ 2025: discharged home to  self-care; reports essentially independent in most self care activities: has PCS aide 7 days a week- assists with bathing and dressing and whatever else I need 1 unplanned hospital admission x last (6) and (12) months  Goal:  Over the next 30 days, the patient will not experience hospital readmission  Interventions:  Transitions of Care: week # 1/ day # 1 Durable Medical Equipment (DME) needs assessed with patient/caregiver Doctor Visits  - discussed the importance of doctor visits Communication with PCP re: enrollment in [facility] 30-day program Post discharge activity limitations prescribed by provider reviewed  Diabetes Interventions: Provided education to patient about basic DM disease process Discussed plans with patient for ongoing care management follow up and provided patient with direct contact information for care management team Reviewed scheduled/upcoming provider appointments including: 12/16/23- with new oncology provider; as above: encouraged patient to promptly schedule hospital follow up with PCP- she declined assistance today  Review of patient status, including review of consultants reports, relevant laboratory and other test results, and medications completed Assessed social determinant of health barriers Reports have not yet started checking my blood sugars- I just got home from the hospital last night, and I am still in bed; Reports I check them once a day normally, I do finger-sticks; I usually check them first thing in the morning, but I have not yet done that. I am taking my insulin  like I always do: 20 U every day, and I am taking all of my medicines like I always do; Encouraged patient to resume blood sugar monitoring at  home promptly- she verbalizes agreement with same   Oncology: Assessment of understanding of oncology diagnosis:  Assessed patient understanding of cancer diagnosis and recommended treatment plan Reviewed upcoming provider appointments and  treatment appointments Assessed available transportation to appointments and treatments. Has consistent/reliable transportation: Yes Assessed support system. Has consistent/reliable family or other support: Yes    Lab Results  Component Value Date   HGBA1C 10.1 (H) 11/23/2023    Patient Self Care Activities:  Attend all scheduled provider appointments Call provider office for new concerns or questions  Participate in Transition of Care Program/Attend [facility] scheduled calls Take medications as prescribed   Please call Dr. Geofm' office to schedule a hospital follow up office visit with her as soon as possible: her number is [phone] Continue pacing activity as your recuperation from your recent hospital visit continues If you believe your condition is getting worse- contact your care providers (doctors) promptly- reaching out to your doctor early when you have concerns can prevent you from having to go to the hospital  Plan:  Telephone follow up appointment with care management team member scheduled for:  12/21/23 with RN CM Barbie for week # 2 and 12/28/23 with RN CM Siarah Deleo for week # 3  Plan for next week's call: Review medications: full review- patient declined at time of initial TOC outreach Review medication adherence Review oncology provider office visit/ labs/ any medication or plan of care changes since hospital discharge (no new medications verified at time of hospital discharge on 12/13/23- per initial TOC call on 12/14/23 Review upcoming provider office visits- check to see if patient called to schedule hospital follow up with PCP Review daily blood sugars since hospital discharge- provide education/ reinforcement around self- management of DM II         Patient verbalizes understanding of instructions and care plan provided today and agrees to view in MyChart. Active MyChart status and patient understanding of how to access instructions and care plan via MyChart confirmed with  patient.     If you are experiencing a Mental Health or Behavioral Health Crisis or need someone to talk to, please  call the Suicide and Crisis Lifeline: 988 call the [place]  National Suicide Prevention Lifeline: (413)541-9284 or TTY: 803-875-8541 TTY 517-708-0298) to talk to a trained counselor call 1-800-273-TALK (toll free, 24 hour hotline) go to [facility] [address], [place] [phone]) call the [facility] Crisis Line: ([phone])[phone] call 911  Beatris Blinda Lawrence, RN, BSN, CCRN Alumnus RN Care Manager  Transitions of Care  VBCI - Population Health  [place] [phone]: direct office

## 2023-12-14 NOTE — Transitions of Care (Post Inpatient/ED Visit) (Signed)
 12/14/2023  Name: Lindsay Hardy MRN: [id number] DOB: 1950-07-11  Today's TOC FU Call Status: Today's TOC FU Call Status:: Successful TOC FU Call Completed TOC FU Call Complete Date: 12/14/23 Patient's Name and Date of Birth confirmed.  Transition Care Management Follow-up Telephone Call Date of Discharge: 12/13/23 Discharge Facility: [facility]) Type of Discharge: Inpatient Admission Primary Inpatient Discharge Diagnosis:: Abdominal pain- chronic with nausea vomiting; in setting of pancreatic CA How have you been since you were released from the hospital?: Better (I am fine, feeling better overall. I have not yet gotten up, have not checked my blood sugar today.  My PCS aide came today- she comes every day; My son will take me to the doctor appointment on 12/16/23.  I don't want to review my medicine today) Any questions or concerns?: No  Items Reviewed: Did you receive and understand the discharge instructions provided?: Yes (Patient declined detailed review of hospital discharge instructions: states she still laying down and is not near her papers; reports she understands everything I am supposed to do and will do it) Medications obtained,verified, and reconciled?: No (Per patient report/ confirmed through review of EHR: no newly Rx'd medications post-hospital discharge; reports she self-manages medications and denies questions/ concerns around medications today; declines medication review at time of TOC call today) Medications Not Reviewed Reasons:: Other: (Patient declines medication review at time of TOC call today: she verbalizes agreement to completing this at time of TOC call for week # 2 on 12/21/23: today- she only confirms that she is taking Insulin  20 U QD) Any new allergies since your discharge?: No Dietary orders reviewed?: Yes Type of Diet Ordered:: I eat as healthy as I can; I have Meals on Wheels and my family is also bringing in food Do you have support at home?:  Yes People in Home [RPT]: other relative(s) (Patient reports she lives with another person; declines specifying whom she resides with- states he is disabled and has an amputated leg, so he can't help me with anything)  Medications Reviewed Today: Medications Reviewed Today     Reviewed by Jaylan Duggar M, RN (Registered Nurse) on 12/14/23 at 1330  Med List Status: <None>   Medication Order Taking? Sig Documenting Provider Last Dose Status Informant  albuterol  (VENTOLIN  HFA) 108 (90 Base) MCG/ACT inhaler [id number]  INHALE 2 PUFFS INTO THE LUNGS EVERY 4 (FOUR) HOURS AS NEEDED FOR SHORTNESS OF BREATH. Geofm Glade PARAS, MD  Active Self, Pharmacy Records  amLODipine  (NORVASC ) 10 MG tablet [id number]  Take 1 tablet (10 mg total) by mouth daily. Geofm Glade PARAS, MD  Active Self, Pharmacy Records  cyanocobalamin  (VITAMIN B12) 500 MCG tablet [id number]  Take 500 mcg by mouth daily. [provider]  Active Self, Pharmacy Records  fluticasone  (FLONASE ) 50 MCG/ACT nasal spray [id number]  Place 2 sprays into both nostrils daily. Geofm Glade PARAS, MD  Active Self, Pharmacy Records  glipiZIDE  (GLUCOTROL ) 10 MG tablet [id number]  TAKE 2 TABLETS BY MOUTH 2 TIMES DAILY BEFORE A MEAL.  Patient taking differently: Take 10 mg by mouth 2 (two) times daily before a meal. TAKE 2 TABLETS BY MOUTH 2 TIMES DAILY BEFORE A MEAL.   Geofm Glade PARAS, MD  Active Self, Pharmacy Records  hydrochlorothiazide  (HYDRODIURIL ) 25 MG tablet [id number]  Take 1 tablet (25 mg total) by mouth daily. Geofm Glade PARAS, MD  Active Self, Pharmacy Records  HYDROcodone -acetaminophen  [facility]) 10-325 MG tablet [id number]  Take 1-2 tablets by mouth every 4 (four) hours as needed for  moderate pain (pain score 4-6) or severe pain (pain score 7-10). Patsy Lenis, MD  Active   ibuprofen  (ADVIL ) 800 MG tablet [id number]  TAKE 1 TABLET BY MOUTH TWICE DAILY AS NEEDED FOR MODERATE PAIN Burns, Glade PARAS, MD  Active Self, Pharmacy Records  insulin  glargine  (LANTUS ) 100 UNIT/ML Solostar Pen [id number] Yes Inject 20 Units into the skin daily. Geofm Glade PARAS, MD  Active Self, Pharmacy Records  linaclotide  (LINZESS ) 145 MCG CAPS capsule [id number]  TAKE 1 CAPSULE BY MOUTH DAILY BEFORE BREAKFAST.  Patient not taking: Reported on 12/07/2023   Geofm Glade PARAS, MD  Active Self, Pharmacy Records  metFORMIN  (GLUCOPHAGE -XR) 500 MG 24 hr tablet [id number]  TAKE 2 TABLETS BY MOUTH TWICE DAILY BEFORE A MEAL  Patient taking differently: Take 500 mg by mouth 2 (two) times daily with a meal. TAKE 2 TABLETS BY MOUTH TWICE DAILY BEFORE A MEAL   Burns, Glade PARAS, MD  Active Self, Pharmacy Records  montelukast  (SINGULAIR ) 10 MG tablet [id number]  TAKE 1 TABLET BY MOUTH EVRY DAY AT BEDTIME  Patient not taking: Reported on 12/07/2023   Geofm Glade PARAS, MD  Active Self, Pharmacy Records  naloxone  (NARCAN ) nasal spray 4 mg/0.1 mL [id number]  1 spray in one nostril x1; may repeat dose in alternate nostrils q2-3min prn Burns, Glade PARAS, MD  Active Self, Pharmacy Records  potassium chloride  (MICRO-K ) 10 MEQ CR capsule [id number]  TAKE 1 CAPSULE BY MOUTH TWICE A DAY  Patient taking differently: Take 10 mEq by mouth as needed.   Geofm Glade PARAS, MD  Active Self, Pharmacy Records           Home Care and Equipment/Supplies: Were Home Health Services Ordered?: No Any new equipment or medical supplies ordered?: No  Functional Questionnaire: Do you need assistance with bathing/showering or dressing?: Yes (Reports gets PCS services 7 days a week for a couple of hours every day) Do you need assistance with meal preparation?: Yes Do you need assistance with eating?: No Do you have difficulty maintaining continence: No Do you need assistance with getting out of bed/getting out of a chair/moving?: No Do you have difficulty managing or taking your medications?: No  Follow up appointments reviewed: PCP Follow-up appointment confirmed?: No (Patient declined my assistance with scheduling  hospital follow up office visit with PCP: states she prefers to call and schedule herself after she completes oncology provider office visit on 12/16/23) MD Provider Line [phone] Given: No (verified well-established with current PCP: encouraged patient to call to schedule hospital follow up office visit promptly- she states she will do) Specialist Hospital Follow-up appointment confirmed?: Yes Date of Specialist follow-up appointment?: 12/16/23 Follow-Up Specialty Provider:: oncology provider Do you need transportation to your follow-up appointment?: No Do you understand care options if your condition(s) worsen?: Yes-patient verbalized understanding  SDOH Interventions Today    Flowsheet Row Most Recent Value  SDOH Interventions   Food Insecurity Interventions Intervention Not Indicated  Housing Interventions Intervention Not Indicated  Transportation Interventions Intervention Not Indicated  [Reports her son provides transportation]  Utilities Interventions Intervention Not Indicated   Enrolled into TOC 30-day program; Attempted to provide my direct contact information should questions/ concerns/ needs arise post-TOC initial call, prior to next [facility] 30-day program RN CM telephone visit, however, patient declined stating she is still in bed and has nothing to write with    See TOC assessment tabs for additional assessment/ TOC intervention information  Pls call/ message for questions,  Leora Platt Mckinney Gedalya Jim, RN, BSN,  CCRN Alumnus RN Care Manager  Transitions of Care  VBCI - Population Health  [place] [phone]: direct office

## 2023-12-15 ENCOUNTER — Other Ambulatory Visit: Payer: Self-pay

## 2023-12-16 ENCOUNTER — Inpatient Hospital Stay: Attending: Oncology | Admitting: Oncology

## 2023-12-16 ENCOUNTER — Other Ambulatory Visit: Payer: Self-pay | Admitting: *Deleted

## 2023-12-16 ENCOUNTER — Encounter: Payer: Self-pay | Admitting: *Deleted

## 2023-12-16 ENCOUNTER — Inpatient Hospital Stay

## 2023-12-16 VITALS — BP 119/74 | HR 100 | Temp 97.9°F | Resp 18 | Ht 65.0 in | Wt 239.0 lb

## 2023-12-16 DIAGNOSIS — Z8673 Personal history of transient ischemic attack (TIA), and cerebral infarction without residual deficits: Secondary | ICD-10-CM | POA: Insufficient documentation

## 2023-12-16 DIAGNOSIS — E119 Type 2 diabetes mellitus without complications: Secondary | ICD-10-CM | POA: Insufficient documentation

## 2023-12-16 DIAGNOSIS — C259 Malignant neoplasm of pancreas, unspecified: Secondary | ICD-10-CM

## 2023-12-16 DIAGNOSIS — K219 Gastro-esophageal reflux disease without esophagitis: Secondary | ICD-10-CM | POA: Insufficient documentation

## 2023-12-16 DIAGNOSIS — K8689 Other specified diseases of pancreas: Secondary | ICD-10-CM

## 2023-12-16 DIAGNOSIS — C250 Malignant neoplasm of head of pancreas: Secondary | ICD-10-CM | POA: Diagnosis not present

## 2023-12-16 DIAGNOSIS — I10 Essential (primary) hypertension: Secondary | ICD-10-CM | POA: Insufficient documentation

## 2023-12-16 DIAGNOSIS — Z5111 Encounter for antineoplastic chemotherapy: Secondary | ICD-10-CM | POA: Insufficient documentation

## 2023-12-16 MED ORDER — FLUCONAZOLE 150 MG PO TABS: 150.0000 mg | ORAL_TABLET | Freq: Once | ORAL | 0 refills | Status: AC

## 2023-12-16 NOTE — Progress Notes (Signed)
 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

## 2023-12-16 NOTE — Progress Notes (Signed)
 Referral for surgical consult entered

## 2023-12-16 NOTE — Progress Notes (Addendum)
 [facility] New Patient Consult   Requesting MD: Geofm Glade PARAS, Md [address] [place],  [place] [address]   Lindsay Hardy 73 y.o.  05-24-1951    Reason for Consult: Pancreas cancer   HPI: Lindsay Hardy reports the onset of abdominal pain and nausea beginning in May of this year.  The lipase and liver enzymes were elevated when she saw Dr. Geofm in May and June.  A CT of the abdomen/pelvis 10/28/2023 revealed evidence of acute diverticulitis.  An ill-defined low-attenuation area in the uncinate 8 of the pancreas was felt to be reactive edema.  A repeat CT on 12/02/2023 Lindsay Hardy mild intrahepatic and common bile duct dilation.  Increased main pancreas duct dilation was noted.  There is a persistent hypoattenuating area in the pancreas head/uncinate measuring 2.9 x 1.9 cm.  An adjacent cystic focus is unchanged.  The pain Chris mass abuts the SMV near the confluence with the portal vein without substantial effacement.  No enlarged lymph nodes.  Incompletely characterized 1.5 cm hypodensity in the lower left kidney and 1.5 cm hyperattenuating focus in the lower pole left kidney.  She was referred to the emergency room on 12/07/2023 after a chemistry panel on 12/06/2023 found the bilirubin at 13.4 with an AST of 286 and ALT 324.  Dr. Rosalie was consulted and she was taken to an ERCP on 12/08/2023.  The major papilla was congested and bulging.  A stricture was seen.  Brushing for cytology was obtained of the lower third of the main bile duct and a covered metal stent was placed in the common bile duct.  She reports feeling better following the biliary stent placement.  Jaundice, nausea, and pain have improved.  The cytology from the bile duct brushing returned positive for adenocarcinoma.  Lindsay Hardy is here with her son. Past Medical History:  Diagnosis Date   Asthma    CAD (coronary artery disease) 11/07/2003    RCA 60% stenosis, LAD diffuse   Chicken pox    CVA (cerebrovascular  accident) (HCC) 06/09/1999    right frontoparietal cortical CVA - MRI September 2001   Diabetes mellitus    Diastolic dysfunction     ejection fraction 55-65% on 2-D echo May 2006   Empty sella syndrome [facility])     based on MRI September 2001, related to obesity, hypertension, it was determined that patient has not required treatment but will be monitored with TSH, ACTH, cortisol, testosterone, prolactin, growth hormone   GERD (gastroesophageal reflux disease)     EGD June 2004 -  followed by Dr. Avram   Granulomatous disease, chronic (HCC)     in right upper lobe per x-ray December 2005   Hyperlipidemia    Hypertension    NAFLD (nonalcoholic fatty liver disease)     based on ultrasound June 2002, elevated transaminase   OA (osteoarthritis)     DJP coracoclavicular ligament, left patella osteophytes June 2002, L4-S1 spondylosis, degenerative disc disease with bulge. I in October 2001, the C2-C4 spondylosis without stenosis and with spurs based on x-ray July 2005; diffuse idiopathic skeletal hyperostosis with bilateral hip lumbar degenerative changes   on x-ray February 2005, bilateral plantar calcaneal spurs   Reactive airway disease     peak flow 180 in May 2005, chronic sinusitis with PND, bronchitis December 2002 and strep pneumonia April 2007,  FEC 66, MCV 60, ratio is 69, DLCO 57,  moderate restrictive and mild obstructive disease  on spirometry May 2005    .  Chronic back pain  Past Surgical History:  Procedure Laterality Date   BILIARY BRUSHING  12/08/2023   Procedure: BRUSH BIOPSY, BILE DUCT;  Surgeon: Rosalie Kitchens, MD;  Location: [facility] ENDOSCOPY;  Service: Gastroenterology;;   BILIARY STENT PLACEMENT  12/08/2023   Procedure: INSERTION, STENT, BILE DUCT;  Surgeon: Rosalie Kitchens, MD;  Location: [facility] ENDOSCOPY;  Service: Gastroenterology;;   CARPAL TUNNEL RELEASE  1992    left arm 1992   CHOLECYSTECTOMY  2003   ERCP N/A 12/08/2023   Procedure: ERCP, WITH INTERVENTION IF INDICATED;  Surgeon:  Rosalie Kitchens, MD;  Location: [facility] ENDOSCOPY;  Service: Gastroenterology;  Laterality: N/A;   TONSILLECTOMY AND ADENOIDECTOMY      Medications: Reviewed  Allergies:  Allergies  Allergen Reactions   Atorvastatin     REACTION: unable to tolerate. reason unknown   Clopidogrel Bisulfate     REACTION: weak, tired, sleepy, tightness (6/05); soreness in hip (11/05)   Crestor  [Rosuvastatin  Calcium ]     Did not like how she felt on it - stomach upset, nausea   Fexofenadine     REACTION: intolerance unknown   Furosemide     REACTION: did not tolerate and preferred HCTZ   Gabapentin      sob   Hydrochlorothiazide -Triamterene     REACTION: things crawled in her head and felt funny(10/04) ; h/a (3/07)   Lisinopril     REACTION: cough, right sided pain   Metoprolol Tartrate     REACTION: lightheadedness, h/a cough (7/04); heart gong to stop (7/06); sluggishnes and depressed feeling (2/07); weakness/heaviness 6/07)   Mometasone Furoate     REACTION: neck tenderness   Telmisartan      Shortness of breath, foot swelling   Ozempic  (0.25 Or 0.5 Mg-Dose) [Semaglutide (0.25 Or 0.5mg -Dos)]     Blurry vision    Family history: Her mother had lung cancer and was a smoker.  A sister had head and neck cancer and another sister had lung cancer-both were smokers.  Her brother had cancer .  Social History:   She lives with a companion in [place].  She is a retired Lawyer.  She does not use cigarettes or alcohol .  No transfusion history.  She was told she had hepatitis in the past  ROS:   Positives include: Nausea, abdominal pain (improved), 1 episode of emesis, 30 pound weight loss, altered taste, dark urine, jaundice, vaginal itching since discharge from the hospital  A complete ROS was otherwise negative.  Physical Exam:  Blood pressure 119/74, pulse 100, temperature 97.9 F (36.6 C), temperature source Temporal, resp. rate 18, height 5' 5 (1.651 m), weight 239 lb (108.4 kg), SpO2  100%.  HEENT: 1.5 cm polypoid lesion at the posterior lateral right tongue Lungs: Clear bilaterally Cardiac: Regular rate and rhythm Abdomen: No hepatosplenomegal, nontender, no mass Vascular: No leg edema Lymph nodes: No cervical, supraclavicular, axillary, or inguinal nodes Neurologic: Alert and oriented, the motor exam appears intact in the upper and lower extremities bilaterally Skin: Mild moist rash in the groin skin folds bilaterally Musculoskeletal: No spine tenderness   LAB:  CBC  Lab Results  Component Value Date   WBC 8.0 12/09/2023   HGB 10.8 (L) 12/09/2023   HCT 32.5 (L) 12/09/2023   MCV 75.1 (L) 12/09/2023   PLT 264 12/09/2023   NEUTROABS 6.2 12/09/2023        CMP  Lab Results  Component Value Date   NA 133 (L) 12/13/2023   K 3.6 12/13/2023   CL 100 12/13/2023  CO2 23 12/13/2023   GLUCOSE 153 (H) 12/13/2023   BUN 7 (L) 12/13/2023   CREATININE 0.75 12/13/2023   CALCIUM  8.5 (L) 12/13/2023   PROT 5.8 (L) 12/13/2023   ALBUMIN 2.4 (L) 12/13/2023   AST 70 (H) 12/13/2023   ALT 147 (H) 12/13/2023   ALKPHOS 319 (H) 12/13/2023   BILITOT 3.7 (H) 12/13/2023   GFRNONAA >60 12/13/2023   GFRAA >60 11/23/2019     Lab Results  Component Value Date   RJW800 <2 12/09/2023    Imaging:  As per HPI, CT images reviewed    Assessment/Plan:   Pancreas cancer, stage Ib (cT2c N0) 12/02/2023 CT abdomen/pelvis: Ill-defined hypoattenuating pancreas head/uncinate lesion with pancreatic biliary duct dilation, unchanged adjacent cystic focus in the pancreas uncinate 8, incompletely characterized 1.5 cm hypodense left lower pole and 1.5 cm hypoattenuating focus in the lower pole left kidney 12/07/2023 CT Abdo/pelvis: Dilation of the intra and extrahepatic biliary tree, ill-defined low attenuating area in the region of the uncinate of the pancreas with a small adjacent cyst, no adenopathy 12/07/2023: MRI abdomen/MRCP: Severe intra and exophytic biliary duct dilation, 3.4 x  2.3 cm mass in the inferior pancreas head and uncinate 8 with effacement of the pancreatic and common ducts, cystic change in the adjacent uncinate 8, mass abuts the central dural SMV with less than 50% contact, fat planes preserved to the SMA, no enlarged lymph nodes, kidneys are normal  12/08/2023 ERCP bile duct brushing-adenocarcinoma 12/09/2023: CT chest-centrilobular emphysema, no evidence of metastatic disease Struct of jaundice secondary to 1, status post placement of a covered common duct stent 12/08/2023 Diabetes CVA in 2001 Empty sella syndrome Hypertension GERD Chronic back pain Indeterminate left renal lesions on CT abdomen/pelvis 12/01/2023, not mentioned on CT/pelvis 10/28/2023 or MRI abdomen 12/07/2023   Disposition:   Lindsay Hardy has been diagnosed with pancreas cancer.  I discussed the CT/MRI findings, pathology report, and treatment options with her.  Her case was presented at the GI tumor conference on 12/15/2023.  The tumor abuts the SMV, but appears resectable.  The surgical oncologists recommend neoadjuvant chemotherapy.  Lindsay Hardy has multiple comorbid conditions and does not appear to be a candidate for FOLFIRINOX chemotherapy.  I recommend gemcitabine/Abraxane.  We reviewed potential toxicities associated with the gemcitabine/Abraxane regimen including the chance of nausea/vomiting, alopecia, hematologic toxicity, infection, and bleeding.  We discussed the rash, fever, and pneumonitis associated with gemcitabine.  We discussed the allergic reaction and neuropathy seen with Abraxane.  She agrees to proceed.  Lindsay Hardy is scheduled to see Dr. Dasie next week.  We will asked Dr. Dasie to place a Port-A-Cath for chemotherapy administration.  Lindsay Hardy will attend a chemotherapy teaching class. She was prescribed Diflucan  for a probable vaginal yeast infection.  She will return for an office visit with the plan to begin neoadjuvant gemcitabine/Abraxane on 12/30/2023.  A treatment  plan was entered today.  She will return for a chemotherapy class and baseline labs next week.  She will have genetic testing.  Lindsay Hardy will benefit from a walker to assist with ambulation.  She will also benefit from home physical therapy.  Arley Hof, MD  12/16/2023, 2:14 PM

## 2023-12-16 NOTE — Progress Notes (Signed)
 Ms. Mirando is requesting a walker to use in home for ambulation. She is weak and has balance issues. Her motorized w/c is very difficult to use in her small apartment. Placed referrals to day for dietician to see and CSW for community resources and counseling for anxiety.

## 2023-12-16 NOTE — Progress Notes (Signed)
 PATIENT NAVIGATOR PROGRESS NOTE  Name: Gwenneth Whiteman Date: 12/16/2023 MRN: [id number]  DOB: August 26, 1950   Reason for visit:  New patient appt  Comments:  Met with Ms Benavidez and her son Westley during visit with Dr Cloretta  Referral to [facility] Referral to SW Referral to Nutrition Referral to home Physical therapy Given Journeys notebook with disease specific information Given handout on Gemzar/Abraxane  Port education provided,  Will coordinate [facility] placement Given contact number to call with any questions She has appt with surgeon Dr Dasie on 7/15    Time spent counseling/coordinating care: > 60 minutes

## 2023-12-17 ENCOUNTER — Encounter: Payer: Self-pay | Admitting: Oncology

## 2023-12-17 ENCOUNTER — Telehealth: Payer: Self-pay | Admitting: Oncology

## 2023-12-17 ENCOUNTER — Encounter: Payer: Self-pay | Admitting: *Deleted

## 2023-12-17 ENCOUNTER — Other Ambulatory Visit: Payer: Self-pay | Admitting: *Deleted

## 2023-12-17 ENCOUNTER — Other Ambulatory Visit: Payer: Self-pay

## 2023-12-17 NOTE — Telephone Encounter (Signed)
 Called PT to let her know about upcoming appt. Will call again

## 2023-12-17 NOTE — Progress Notes (Unsigned)
 Ordered rolling walker on [facility] for safe home ambulation.

## 2023-12-20 ENCOUNTER — Telehealth: Payer: Self-pay

## 2023-12-20 ENCOUNTER — Encounter: Payer: Self-pay | Admitting: Oncology

## 2023-12-20 DIAGNOSIS — G894 Chronic pain syndrome: Secondary | ICD-10-CM | POA: Diagnosis not present

## 2023-12-20 DIAGNOSIS — C259 Malignant neoplasm of pancreas, unspecified: Secondary | ICD-10-CM

## 2023-12-20 NOTE — Telephone Encounter (Signed)
 Biomarker Study: Tissue and Bodily Fluids - Discovery and Evaluation of Biomarkers for the Diagnosis and Treatment of Disease  MT1410-A - Cancer Cohort  Patient Lindsay Hardy was identified by Dr. Cloretta as a potential candidate for the above listed study.  This Clinical Research Coordinator called Lindsay Hardy, [id number], on 12/20/23 to discuss participation in the above listed research study.    The CRC introduced the study to the pt over the phone. Pt stated that she is not interested in participating and does not wish to donate blood to the study. She was thanked for her time and encouraged to contact the research team if she has any questions in the future.  Wendie Diskin, Ph.D. Clinical Research Coordinator [phone] 12/20/2023 4:10 PM

## 2023-12-20 NOTE — Progress Notes (Signed)
 The proposed treatment discussed in conference is for discussion purpose only and is not a binding recommendation.  The patients have not been physically examined, or presented with their treatment options.  Therefore, final treatment plans cannot be decided.

## 2023-12-20 NOTE — Telephone Encounter (Signed)
 [facility] Clinical Social Work  Clinical Social Work was referred by nurse for emotional support and new Patient assessment.  Clinical Social Worker attempted to contact patient by phone to offer support and assess for needs.     Interventions: Left VM x1       Follow Up Plan:  CSW will make another attempt to reach patient.    Lizbeth Sprague, LCSW  Clinical Social Worker [facility]

## 2023-12-21 ENCOUNTER — Telehealth: Payer: Self-pay

## 2023-12-21 NOTE — Progress Notes (Signed)
 Pharmacist Chemotherapy Monitoring - Initial Assessment    Anticipated start date: 724/25   The following has been reviewed per standard work regarding the patient's treatment regimen: The patient's diagnosis, treatment plan and drug doses, and organ/hematologic function Lab orders and baseline tests specific to treatment regimen  The treatment plan start date, drug sequencing, and pre-medications Prior authorization status  Patient's documented medication list, including drug-drug interaction screen and prescriptions for anti-emetics and supportive care specific to the treatment regimen The drug concentrations, fluid compatibility, administration routes, and timing of the medications to be used The patient's access for treatment and lifetime cumulative dose history, if applicable  The patient's medication allergies and previous infusion related reactions, if applicable   Changes made to treatment plan:  N/A  Follow up needed:  N/A   Reshonda Koerber Kreul, RPH, 12/21/2023  2:17 PM

## 2023-12-21 NOTE — Telephone Encounter (Signed)
 [facility] Clinical Social Work  Clinical Social Work was referred by nurse for emotional support.  Clinical Social Worker attempted to contact patient by phone to offer support and assess for needs.     Interventions: CSW spoke briefly with patient and explained purpose for call. Patient stated counseling is not a need at this time.       Follow Up Plan:  CSW will complete new patient assessment via telephone on 7/16 as requested by patient. Referral deferred until then    Lizbeth Sprague, LCSW  Clinical Social Worker [facility]

## 2023-12-22 ENCOUNTER — Telehealth: Payer: Self-pay

## 2023-12-22 NOTE — Patient Instructions (Signed)
 Visit Information  Thank you for taking time to visit with me today. Please don't hesitate to contact me if I can be of assistance to you before our next scheduled telephone appointment.  Our next appointment is by telephone on 12/28/23.  Following is a copy of your care plan:   Goals Addressed             This Visit's Progress    VBCI Transitions of Care (TOC) Care Plan   On track    Problems: (Reviewed and or Updated 12/22/23 call). Recent Hospitalization for treatment of Abdominal pain/ nausea- vomiting in setting of pancreatic cancer Limited social support: Reports my son helps as much as he can; I have a PCS aide that comes in every day of the week for a couple of hours each day  No Hospital Follow Up Provider appointment: 12/14/23: patient declined assistance in scheduling hospital follow up office visit with PCP: she wishes to schedule herself after she has new patient appointment with oncology provider on 12/16/23: verbalizes plans to attend oncology appointment as scheduled- reports son to provide transportation Hospitalized 07/01-05/ 2025: discharged home to self-care; reports essentially independent in most self care activities: has PCS aide 7 days a week- assists with bathing and dressing and whatever else I need 1 unplanned hospital admission x last (6) and (12) months PCP HFU still not made, counseled patient on importance, patient overwhelmed by new diagnosis/appointments/treatment plan pending.   Goal: Reviewed and or Updated 12/22/23 call). Over the next 30 days, the patient will not experience hospital readmission  Interventions: (Reviewed and or Updated 12/22/23 call). Transitions of Care: week # 1/ day # 1 Durable Medical Equipment (DME) needs assessed with patient/caregiver Doctor Visits  - discussed the importance of doctor visits Communication with PCP re: enrollment in [facility] 30-day program Post discharge activity limitations prescribed by provider  reviewed  Diabetes Interventions:(Reviewed and or Updated 12/22/23 call). Review education to patient about basic DM disease process Discussed plans with patient for ongoing care management follow up and provided patient with direct contact information for care management team Ongoing Reviewed scheduled/upcoming provider appointments including:  Specialist appointment 12/23/23, reviewed importance and patient to make PCP appointment after this appointment.  Review of patient status, including review of consultants reports, relevant laboratory and other test results, and medications completed:  Ongoing.  Re-assessed social determinant of health barriers Patient reports taking blood sugar readings in the am.  Today was 152 fasting. That made patient happy to see an improvement.  To record readings.   Oncology: (Reviewed and or Updated 12/22/23 call). Re-assessment of understanding of oncology diagnosis:  Re-assess patient understanding of cancer diagnosis and recommended treatment plan Reviewed upcoming provider appointments and treatment appointments Assessed available transportation to appointments and treatments. Has consistent/reliable transportation: Yes Assessed support system. Has consistent/reliable family or other support: Yes    Lab Results  Component Value Date   HGBA1C 10.1 (H) 11/23/2023    Patient Self Care Activities: (Reviewed and or Updated 12/22/23 call). Attend all scheduled provider appointments Call provider office for new concerns or questions  Participate in Transition of Care Program/Attend TOC scheduled calls Take medications as prescribed   Please call Dr. Geofm' office to schedule a hospital follow up office visit with her as soon as possible: her number is [phone] Continue pacing activity as your recuperation from your recent hospital visit continues Verbalizes understanding of when to call providers or EMS.  Monitor pain score and results after pain  medication, encouraged  to keep a log.  Consider speaking to LCSW via Oncology team and has a phone appointment on  12/24/23. (Had declined at first outreach).   Plan:  Telephone follow up appointment with care management team member scheduled for:   12/28/23 with RN CM Laine for week # 3  Plan for next week's call: Review medications for any changes post specialist follow up visit on 12/23/23 Review continued medication adherence Review oncology provider office visit/labs/ any medication or plan of care changes since hospital discharge Review upcoming provider office visits: Check in with patient on promise to contact PCP for HFU after oncology appointment on 12/23/23. Importance of this was reviewed again, she states she has been overwhelmed with everything related to new diagnosis.  Review daily blood sugars since last week. Reported 152 fasting day of this call on 12/21/23.  Review pain control/scores, and medication efficacy.         Patient verbalizes understanding of instructions and care plan provided today and agrees to view in MyChart. Active MyChart status and patient understanding of how to access instructions and care plan via MyChart confirmed with patient.     Telephone follow up appointment with care management team member scheduled for: 12/28/23 with [facility] RN CM Laine Tousey.   Please call the care guide team at [phone] if you need to cancel or reschedule your appointment.   Please call the Suicide and Crisis Lifeline: 988 call the [place]  National Suicide Prevention Lifeline: 929-580-6034 or TTY: 770-250-9393 TTY 458-354-4395) to talk to a trained counselor if you are experiencing a Mental Health or Behavioral Health Crisis or need someone to talk to.   Bing Edison MSN, RN RN Case Sales executive Health  VBCI-Population Health Office Hours M-F [phone] Direct Dial: [phone] Main Phone [phone]  Fax: [phone] [email or web address]

## 2023-12-22 NOTE — Transitions of Care (Post Inpatient/ED Visit) (Signed)
 Transition of Care week 2  Visit Note  12/22/2023  Name: Lindsay Hardy MRN: [id number]          DOB: 04-09-1951  Situation: Patient enrolled in [facility] 30-day program. Visit completed with patient by telephone.   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 Avram   Granulomatous disease, chronic (HCC)     in right upper lobe per x-ray December 2005   Hyperlipidemia    Hypertension    NAFLD (nonalcoholic fatty liver disease)     based on ultrasound June 2002, elevated transaminase   OA (osteoarthritis)     DJP coracoclavicular ligament, left patella osteophytes June 2002, L4-S1 spondylosis, degenerative disc disease with bulge. I in October 2001, the C2-C4 spondylosis without stenosis and with spurs based on x-ray July 2005; diffuse idiopathic skeletal hyperostosis with bilateral hip lumbar degenerative changes   on x-ray February 2005, bilateral plantar calcaneal spurs   Reactive airway disease     peak flow 180 in May 2005, chronic sinusitis with PND, bronchitis December 2002 and strep pneumonia April 2007,  FEC 66, MCV 60, ratio is 69, DLCO 57,  moderate restrictive and mild obstructive disease   on spirometry May 2005    Assessment: Patient Reported Symptoms: Cognitive Cognitive Status: No symptoms reported, Able to follow simple commands, Alert and oriented to person, place, and time, Normal speech and language skills, Insightful and able to interpret abstract concepts      Neurological Neurological Review of Symptoms: No symptoms reported    HEENT HEENT Symptoms Reported: No symptoms reported      Cardiovascular Cardiovascular Symptoms Reported: No symptoms reported Does patient have uncontrolled Hypertension?: No    Respiratory Respiratory Symptoms Reported: No symptoms reported    Endocrine Endocrine Symptoms Reported: No symptoms reported Is patient diabetic?: Yes Is patient checking blood sugars at home?: Yes List most recent blood sugar readings, include date and time of day: 152 this am fasting Endocrine Self-Management Outcome: 4 (good) Endocrine Comment: She was happy on her blood sugar readings.  Gastrointestinal Gastrointestinal Symptoms Reported: Abdominal pain or discomfort Other Gastrointestinal Symptoms: Pancreatic Cancer. Pain medication controls the pain in abdominal area. Reports no other GI symptoms at this time. Denies constipation from pain medication. Gastrointestinal Management Strategies: Medication therapy, Coping strategies, Adequate rest, Nutrition support (Patient did state that a nutritionist was to outreach to her but has not yet. Follow up next week after Oncology visit 12/23/23.) Gastrointestinal Self-Management Outcome: 3 (uncertain)    Genitourinary Genitourinary Symptoms Reported: No symptoms reported    Integumentary Integumentary Symptoms Reported: No symptoms reported    Musculoskeletal Musculoskelatal Symptoms Reviewed: No symptoms reported  Patient at Risk for Falls Due to: Other (Comment), Medication side effect Fall risk Follow up: Falls prevention discussed  Psychosocial Psychosocial Symptoms Reported: Other (Significant  new diagnosis of pancreatic cancer patient is still going through staging/ referrals, treatment planning.) Other Psychosocial Conditions: Significant new diagnosis of pancreatic cancer patient is still going through staging/ referrals, treatment planning. Behavioral Management Strategies: Coping strategies, Support system (Has appointment with oncology LCSW Friday 12/24/23 and access to help via insurance benefits patient reported. Strong religous belief.) Behavioral Health Self-Management Outcome: 4 (good) Major Change/Loss/Stressor/Fears (CP): Medical condition, self Techniques to Cope with Loss/Stress/Change: Spiritual practice(s) Quality of Family Relationships: supportive Do you feel physically threatened by others?: No   There were no vitals filed for this visit.  Medications Reviewed Today     Reviewed by Carolee Heron NOVAK, RN (Case Manager) on 12/22/23 at 1633  Med List Status: <None>   Medication Order Taking? Sig Documenting Provider Last Dose Status Informant  albuterol  (VENTOLIN  HFA) 108 (90 Base) MCG/ACT inhaler [id number] Yes INHALE 2 PUFFS INTO THE LUNGS EVERY 4 (FOUR) HOURS AS NEEDED FOR SHORTNESS OF BREATH. Geofm Glade PARAS, MD  Active Self, Pharmacy Records  amLODipine  (NORVASC ) 10 MG tablet [id number] Yes Take 1 tablet (10 mg total) by mouth daily. Geofm Glade PARAS, MD  Active Self, Pharmacy Records  cyanocobalamin  (VITAMIN B12) 500 MCG tablet [id number] Yes Take 500 mcg by mouth daily. [provider]  Active Self, Pharmacy Records  fluticasone  (FLONASE ) 50 MCG/ACT nasal spray [id number] Yes Place 2 sprays into both nostrils daily. Geofm Glade PARAS, MD  Active Self, Pharmacy Records  glipiZIDE  (GLUCOTROL ) 10 MG tablet [id number] Yes TAKE 2 TABLETS BY MOUTH 2 TIMES DAILY BEFORE A MEAL. Geofm Glade PARAS, MD  Active Self, Pharmacy Records  hydrochlorothiazide  (HYDRODIURIL ) 25 MG tablet [id number] Yes Take 1 tablet (25 mg total) by mouth daily. Geofm Glade PARAS, MD  Active Self, Pharmacy  Records  HYDROcodone -acetaminophen  [facility]) 10-325 MG tablet [id number] Yes Take 1-2 tablets by mouth every 4 (four) hours as needed for moderate pain (pain score 4-6) or severe pain (pain score 7-10). Patsy Lenis, MD  Active   ibuprofen  (ADVIL ) 800 MG tablet [id number] Yes TAKE 1 TABLET BY MOUTH TWICE DAILY AS NEEDED FOR MODERATE PAIN Burns, Glade PARAS, MD  Active Self, Pharmacy Records  insulin  glargine (LANTUS ) 100 UNIT/ML Solostar Pen [id number] Yes Inject 20 Units into the skin daily. Geofm Glade PARAS, MD  Active Self, Pharmacy Records  linaclotide  (LINZESS ) 145 MCG CAPS capsule [id number]  TAKE 1 CAPSULE BY MOUTH DAILY BEFORE BREAKFAST.  Patient not taking: Reported on 12/22/2023   Geofm Glade PARAS, MD  Active Self, Pharmacy Records  metFORMIN  (GLUCOPHAGE -XR) 500 MG 24 hr tablet [id number] Yes TAKE 2 TABLETS BY MOUTH TWICE DAILY BEFORE A MEAL Burns, Glade PARAS, MD  Active Self, Pharmacy Records  montelukast  (SINGULAIR ) 10 MG tablet [id number] Yes TAKE 1 TABLET BY MOUTH EVRY DAY AT BEDTIME Geofm Glade PARAS, MD  Active Self, Pharmacy Records  naloxone  (NARCAN ) nasal spray 4 mg/0.1 mL [id number] Yes 1 spray in one nostril x1; may repeat dose in alternate nostrils q2-3min prn  Patient taking differently: 1 spray in one nostril x1; may repeat dose in alternate nostrils q2-3min prn   Burns, Glade PARAS, MD  Active Self, Pharmacy Records  potassium chloride  (MICRO-K ) 10 MEQ CR capsule [id number] Yes TAKE 1 CAPSULE BY MOUTH TWICE A DAY  Patient taking differently: TAKE 1 CAPSULE BY MOUTH TWICE A DAY   Burns, Glade PARAS, MD  Active Self, Pharmacy Records            Goals Addressed             This Visit's Progress    [facility] Transitions of Care (TOC) Care Plan   On track    Problems: (Reviewed and or Updated 12/22/23 call). Recent Hospitalization for treatment of Abdominal pain/ nausea- vomiting in setting of pancreatic cancer Limited social support: Reports my son helps as much as he can; I have a PCS aide that  comes in every day of the week for a couple of hours each day  No Hospital Follow Up Provider appointment: 12/14/23: patient declined assistance in scheduling hospital follow up office visit with PCP: she wishes to schedule herself after she has new patient appointment with oncology provider on 12/16/23: verbalizes plans to attend oncology appointment as scheduled- reports son to provide transportation Hospitalized 07/01-05/ 2025: discharged home to self-care; reports essentially independent in most self care activities: has PCS aide 7 days a week- assists with bathing and dressing and whatever else I need 1 unplanned hospital admission x last (6) and (12) months PCP HFU still not made, counseled patient on importance, patient overwhelmed by new diagnosis/appointments/treatment plan pending.   Goal: Reviewed and or Updated 12/22/23 call). Over the next 30 days, the patient will not experience hospital readmission  Interventions: (Reviewed and or Updated 12/22/23 call). Transitions of Care: week # 1/ day # 1 Durable Medical Equipment (DME) needs assessed with patient/caregiver Doctor Visits  - discussed the importance of doctor visits Communication with PCP re: enrollment in [facility] 30-day program Post discharge activity limitations prescribed by provider reviewed  Diabetes Interventions:(Reviewed and or Updated 12/22/23 call). Review education to patient about basic DM disease process Discussed plans with patient for ongoing care management follow up and provided patient with direct contact information for care management team Ongoing Reviewed scheduled/upcoming provider appointments including:  Specialist appointment 12/23/23, reviewed importance and patient to make PCP appointment after this appointment.  Review of patient status, including review of consultants reports, relevant laboratory and other test results, and medications completed:  Ongoing.  Re-assessed social determinant of health  barriers Patient reports taking blood sugar readings in the am.  Today was 152 fasting. That made patient happy to see an improvement.  To record readings.   Oncology: (Reviewed and or Updated 12/22/23 call). Re-assessment of understanding of oncology diagnosis:  Re-assess patient understanding of cancer diagnosis and recommended treatment plan Reviewed upcoming provider appointments and treatment appointments Assessed available transportation to appointments and treatments. Has consistent/reliable transportation: Yes Assessed support system. Has consistent/reliable family or other support: Yes    Lab Results  Component Value Date   HGBA1C 10.1 (H) 11/23/2023    Patient Self Care Activities: (Reviewed and or Updated 12/22/23 call). Attend all scheduled provider appointments Call provider office for new concerns or questions  Participate in Transition of Care Program/Attend TOC scheduled calls Take medications as prescribed   Please call Dr. Geofm' office to schedule a hospital follow up office visit with her as soon as possible: her number is [phone] Continue pacing activity as your recuperation from your recent hospital visit continues Verbalizes understanding of when to call providers or EMS.  Monitor pain score and results after pain medication, encouraged to keep a log.  Consider speaking to LCSW via Oncology team and has a phone appointment on  12/24/23. (Had declined at first outreach).   Plan:  Telephone follow up appointment with care management team member  scheduled for:   12/28/23 with RN CM Laine for week # 3  Plan for next week's call: Review medications for any changes post specialist follow up visit on 12/23/23 Review continued medication adherence Review oncology provider office visit/labs/ any medication or plan of care changes since hospital discharge Review upcoming provider office visits: Check in with patient on promise to contact PCP for HFU after oncology  appointment on 12/23/23. Importance of this was reviewed again, she states she has been overwhelmed with everything related to new diagnosis.  Review daily blood sugars since last week. Reported 152 fasting day of this call on 12/21/23.  Review pain control/scores, and medication efficacy.         Recommendation:   Continue Current Plan of Care Patient to make PCP HFU appointment after 12/23/23 follow up with specialist  Follow Up Plan:   Telephone follow-up in 1 week scheduled with Laine Tousey on 12/28/23.    Bing Edison MSN, RN RN Case Sales executive Health  [facility] Office Hours M-F [phone] Direct Dial: [phone] Main Phone [phone]  Fax: [phone] [email or web address]

## 2023-12-22 NOTE — Telephone Encounter (Signed)
 [facility] Clinical Social Work  Clinical Social Work was referred by new patient protocol for new patient assessment.  Clinical Social Worker contacted patient by phone to offer support and assess for needs.  Patient stated now is not a good time to talk. Patient understands contact information will be in [facility].    Interventions: None      Follow Up Plan:  Patient will contact CSW with any support or resource needs    Lizbeth Sprague, LCSW  Clinical Social Worker [facility]

## 2023-12-23 ENCOUNTER — Ambulatory Visit: Payer: Self-pay | Admitting: Surgery

## 2023-12-23 DIAGNOSIS — C250 Malignant neoplasm of head of pancreas: Secondary | ICD-10-CM | POA: Diagnosis not present

## 2023-12-23 NOTE — H&P (Signed)
 History of Present Illness: Lindsay Hardy is a 73 y.o. female who is seen today as an office consultation for evaluation of New Consultation (PANCREATIC CANCER)   She presented in May with abdominal pain and nausea, and a CT scan at that time showed acute duodenal diverticulitis. There was a hypoattenuating area in the uncinate process of the pancreas on CT at that time which was initially thought to represent reactive edema. She had a follow up CT scan to further characterize this on 6/26, which showed persistence of the hypodense area as well as main pancreatic duct and biliary ductal dilation. These findings were confirmed with MRCP, which showed a 3.4cm mass in the head of the pancreas. Bilirubin was elevated to 14.2. She underwent an ERCP on 7/2 with placement of a covered metal stent. Brushings of the distal biliary stricture confirmed adenocarcinoma. CT chest showed no evidence of metastatic disease. She has already seen medical oncology (Dr. Cloretta) and is referred to me for surgical resection.    Previous abdominal surgeries include a lap cholecystectomy and a tubal ligation. She does not take any blood thinners. She has chronic back pain that limits ambulation, and thus mostly uses a wheelchair for mobility. She denies chest pain with exertion.     Review of Systems: A complete review of systems was obtained from the patient.  I have reviewed this information and discussed as appropriate with the patient.  See HPI as well for other ROS.     Medical History: Past Medical History Past Medical History: Diagnosis Date  Arthritis    Asthma, unspecified asthma severity, unspecified whether complicated, unspecified whether persistent (HHS-HCC)    GERD (gastroesophageal reflux disease)    Hypertension         Problem List There is no problem list on file for this patient.     Past Surgical History Past Surgical History: Procedure Laterality Date  CHOLECYSTECTOMY       TONSILLECTOMY          Allergies Allergies Allergen Reactions  Atorvastatin Unknown     REACTION: unable to tolerate. reason unknown  Clopidogrel Unknown     REACTION: weak, tired, sleepy, tightness (6/05); soreness in hip (11/05)  Fexofenadine Unknown     REACTION: intolerance unknown  Furosemide Other (See Comments)     REACTION: did not tolerate and preferred HCTZ  Gabapentin  Unknown     sob  Hydrochlorothiazide  Unknown  Lisinopril Unknown     REACTION: cough, right sided pain  Metoprolol Unknown     REACTION: lightheadedness, h/a cough (7/04); heart gong to stop (7/06); sluggishnes and depressed feeling (2/07); weakness/heaviness 6/07)  Mometasone Unknown  Rosuvastatin  Unknown     Did not like how she felt on it - stomach upset, nausea  Telmisartan  Unknown     Shortness of breath, foot swelling  Semaglutide  Other (See Comments)     Blurry vision      Medications Ordered Prior to Encounter Current Outpatient Medications on File Prior to Visit Medication Sig Dispense Refill  albuterol  MDI, PROVENTIL , VENTOLIN , PROAIR , HFA 90 mcg/actuation inhaler INHALE 2 PUFFS INTO THE LUNGS EVERY 4 (FOUR) HOURS AS NEEDED FOR SHORTNESS OF BREATH.      amLODIPine  (NORVASC ) 10 MG tablet Take 10 mg by mouth once daily      cyanocobalamin  (VITAMIN B12) 500 MCG tablet Take 500 mcg by mouth once daily      fluticasone  propionate (FLONASE ) 50 mcg/actuation nasal spray Place 2 sprays into both nostrils once daily  glipiZIDE  (GLUCOTROL ) 10 MG tablet TAKE 2 TABLETS BY MOUTH 2 TIMES DAILY BEFORE A MEAL.      hydroCHLOROthiazide  (HYDRODIURIL ) 25 MG tablet Take 25 mg by mouth once daily      HYDROcodone -acetaminophen  (NORCO) 10-325 mg tablet 1 - 2 tablet as needed Orally every 8 hrs for 30 days moderate pain      ibuprofen  (MOTRIN ) 800 MG tablet TAKE 1 TABLET BY MOUTH TWICE DAILY AS NEEDED FOR MODERATE PAIN      insulin  GLARGINE (LANTUS  SOLOSTAR) pen injector (concentration 100  units/mL) Inject 20 Units subcutaneously once daily      metFORMIN  (GLUCOPHAGE -XR) 500 MG XR tablet TAKE 2 TABLETS BY MOUTH TWICE DAILY BEFORE A MEAL      montelukast  (SINGULAIR ) 10 mg tablet TAKE 1 TABLET BY MOUTH EVRY DAY AT BEDTIME      naloxone  (NARCAN ) 4 mg/actuation nasal spray 1 spray in one nostril x1; may repeat dose in alternate nostrils q2-3min prn      potassium chloride  (MICRO-K ) 10 MEQ ER capsule Take 10 mEq by mouth 2 (two) times daily        No current facility-administered medications on file prior to visit.      Family History Family History Problem Relation Age of Onset  High blood pressure (Hypertension) Mother    High blood pressure (Hypertension) Sister    Diabetes Sister    High blood pressure (Hypertension) Brother    Diabetes Brother        Tobacco Use History Social History    Tobacco Use Smoking Status Former  Types: Cigarettes Smokeless Tobacco Never      Social History Social History    Socioeconomic History  Marital status: Single Tobacco Use  Smoking status: Former     Types: Cigarettes  Smokeless tobacco: Never Vaping Use  Vaping status: Never Used Substance and Sexual Activity  Alcohol  use: Never  Drug use: Defer    Social Drivers of Manufacturing engineer Strain: Low Risk  (11/18/2023)   Received from [facility]   Overall Financial Resource Strain (CARDIA)    How hard is it for you to pay for the very basics like food, housing, medical care, and heating?: Not hard at all Food Insecurity: No Food Insecurity (12/22/2023)   Received from [facility]   Hunger Vital Sign    Within the past 12 months, you worried that your food would run out before you got the money to buy more.: Never true    Within the past 12 months, the food you bought just didn't last and you didn't have money to get more.: Never true Transportation Needs: No Transportation Needs (12/22/2023)   Received from [facility] - Transportation     In the past 12 months, has lack of transportation kept you from medical appointments or from getting medications?: No    In the past 12 months, has lack of transportation kept you from meetings, work, or from getting things needed for daily living?: No Physical Activity: Inactive (11/18/2023)   Received from [facility]   Exercise Vital Sign    On average, how many days per week do you engage in moderate to strenuous exercise (like a brisk walk)?: 0 days    On average, how many minutes do you engage in exercise at this level?: 0 min Stress: No Stress Concern Present (11/18/2023)   Received from [facility] - Occupational Stress Questionnaire  Do you feel stress - tense, restless, nervous, or anxious, or unable to sleep at night because your mind is troubled all the time - these days?: Only a little Social Connections: Moderately Integrated (12/08/2023)   Received from [facility]   Social Connection and Isolation Panel    In a typical week, how many times do you talk on the phone with family, friends, or neighbors?: More than three times a week    How often do you get together with friends or relatives?: More than three times a week    How often do you attend church or religious services?: More than 4 times per year    Do you belong to any clubs or organizations such as church groups, unions, fraternal or athletic groups, or school groups?: No    How often do you attend meetings of the clubs or organizations you belong to?: Never    Are you married, widowed, divorced, separated, never married, or living with a partner?: Living with partner      Objective:   Vitals:   12/23/23 1401 12/23/23 1402 BP: 129/74   Pulse: (!) 115   Temp: 36.4 C (97.5 F)   SpO2: 98%   Weight: (!) 108.2 kg (238 lb 9.6 oz)   Height: 165.7 cm (5' 5.25)   PainSc:     3 PainLoc:   Abdomen   Body mass index is 39.4 kg/m.   Physical Exam Vitals reviewed.   Constitutional:      General: She is not in acute distress.   Eyes:     Conjunctiva/sclera: Conjunctivae normal.     Comments: Very mild scleral icterus    Cardiovascular:     Rate and Rhythm: Normal rate and regular rhythm.     Heart sounds: No murmur heard. Pulmonary:     Effort: Pulmonary effort is normal. No respiratory distress.     Breath sounds: Normal breath sounds. No wheezing.  Abdominal:     General: There is no distension.     Palpations: Abdomen is soft.     Tenderness: There is no abdominal tenderness.    Musculoskeletal:     Comments: Wheelchair bound    Skin:    General: Skin is warm and dry.    Neurological:     General: No focal deficit present.     Mental Status: She is alert and oriented to person, place, and time.          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 I personally reviewed her labs, imaging and referral notes. The mass in the uncinate appears to abut the distal SMV without vascular distortion. There is no evidence of arterial involvement. There also appears to be an adjacent cystic lesion. This is resectable disease however given the venous abutment and recent duodenitis/pancreatitis, I recommended neoadjuvant chemotherapy. Her case has been reviewed at multidisciplinary tumor board and this was also the consensus of the group. Dr. Cloretta is planning to treat with gemcitabine/Abraxane and port placement has been requested. I reviewed the details of portacath insertion including the benefits and the risks of bleeding, infection and pneumothorax. She expressed understanding and agrees to proceed. She will be scheduled for port placement next week. I will see her back after 6-8 cycles of chemotherapy with restaging scans. All questions were  answered.  Leonor Dawn, MD [facility] General, Hepatobiliary and Pancreatic Surgery 12/23/23 2:56 PM

## 2023-12-23 NOTE — H&P (View-Only) (Signed)
 History of Present Illness: Lindsay Hardy is a 73 y.o. female who is seen today as an office consultation for evaluation of New Consultation (PANCREATIC CANCER)   She presented in May with abdominal pain and nausea, and a CT scan at that time showed acute duodenal diverticulitis. There was a hypoattenuating area in the uncinate process of the pancreas on CT at that time which was initially thought to represent reactive edema. She had a follow up CT scan to further characterize this on 6/26, which showed persistence of the hypodense area as well as main pancreatic duct and biliary ductal dilation. These findings were confirmed with MRCP, which showed a 3.4cm mass in the head of the pancreas. Bilirubin was elevated to 14.2. She underwent an ERCP on 7/2 with placement of a covered metal stent. Brushings of the distal biliary stricture confirmed adenocarcinoma. CT chest showed no evidence of metastatic disease. She has already seen medical oncology (Dr. Cloretta) and is referred to me for surgical resection.    Previous abdominal surgeries include a lap cholecystectomy and a tubal ligation. She does not take any blood thinners. She has chronic back pain that limits ambulation, and thus mostly uses a wheelchair for mobility. She denies chest pain with exertion.     Review of Systems: A complete review of systems was obtained from the patient.  I have reviewed this information and discussed as appropriate with the patient.  See HPI as well for other ROS.     Medical History: Past Medical History Past Medical History: Diagnosis Date  Arthritis    Asthma, unspecified asthma severity, unspecified whether complicated, unspecified whether persistent (HHS-HCC)    GERD (gastroesophageal reflux disease)    Hypertension         Problem List There is no problem list on file for this patient.     Past Surgical History Past Surgical History: Procedure Laterality Date  CHOLECYSTECTOMY       TONSILLECTOMY          Allergies Allergies Allergen Reactions  Atorvastatin Unknown     REACTION: unable to tolerate. reason unknown  Clopidogrel Unknown     REACTION: weak, tired, sleepy, tightness (6/05); soreness in hip (11/05)  Fexofenadine Unknown     REACTION: intolerance unknown  Furosemide Other (See Comments)     REACTION: did not tolerate and preferred HCTZ  Gabapentin  Unknown     sob  Hydrochlorothiazide  Unknown  Lisinopril Unknown     REACTION: cough, right sided pain  Metoprolol Unknown     REACTION: lightheadedness, h/a cough (7/04); heart gong to stop (7/06); sluggishnes and depressed feeling (2/07); weakness/heaviness 6/07)  Mometasone Unknown  Rosuvastatin  Unknown     Did not like how she felt on it - stomach upset, nausea  Telmisartan  Unknown     Shortness of breath, foot swelling  Semaglutide  Other (See Comments)     Blurry vision      Medications Ordered Prior to Encounter Current Outpatient Medications on File Prior to Visit Medication Sig Dispense Refill  albuterol  MDI, PROVENTIL , VENTOLIN , PROAIR , HFA 90 mcg/actuation inhaler INHALE 2 PUFFS INTO THE LUNGS EVERY 4 (FOUR) HOURS AS NEEDED FOR SHORTNESS OF BREATH.      amLODIPine  (NORVASC ) 10 MG tablet Take 10 mg by mouth once daily      cyanocobalamin  (VITAMIN B12) 500 MCG tablet Take 500 mcg by mouth once daily      fluticasone  propionate (FLONASE ) 50 mcg/actuation nasal spray Place 2 sprays into both nostrils once daily  glipiZIDE  (GLUCOTROL ) 10 MG tablet TAKE 2 TABLETS BY MOUTH 2 TIMES DAILY BEFORE A MEAL.      hydroCHLOROthiazide  (HYDRODIURIL ) 25 MG tablet Take 25 mg by mouth once daily      HYDROcodone -acetaminophen  (NORCO) 10-325 mg tablet 1 - 2 tablet as needed Orally every 8 hrs for 30 days moderate pain      ibuprofen  (MOTRIN ) 800 MG tablet TAKE 1 TABLET BY MOUTH TWICE DAILY AS NEEDED FOR MODERATE PAIN      insulin  GLARGINE (LANTUS  SOLOSTAR) pen injector (concentration 100  units/mL) Inject 20 Units subcutaneously once daily      metFORMIN  (GLUCOPHAGE -XR) 500 MG XR tablet TAKE 2 TABLETS BY MOUTH TWICE DAILY BEFORE A MEAL      montelukast  (SINGULAIR ) 10 mg tablet TAKE 1 TABLET BY MOUTH EVRY DAY AT BEDTIME      naloxone  (NARCAN ) 4 mg/actuation nasal spray 1 spray in one nostril x1; may repeat dose in alternate nostrils q2-3min prn      potassium chloride  (MICRO-K ) 10 MEQ ER capsule Take 10 mEq by mouth 2 (two) times daily        No current facility-administered medications on file prior to visit.      Family History Family History Problem Relation Age of Onset  High blood pressure (Hypertension) Mother    High blood pressure (Hypertension) Sister    Diabetes Sister    High blood pressure (Hypertension) Brother    Diabetes Brother        Tobacco Use History Social History    Tobacco Use Smoking Status Former  Types: Cigarettes Smokeless Tobacco Never      Social History Social History    Socioeconomic History  Marital status: Single Tobacco Use  Smoking status: Former     Types: Cigarettes  Smokeless tobacco: Never Vaping Use  Vaping status: Never Used Substance and Sexual Activity  Alcohol  use: Never  Drug use: Defer    Social Drivers of Manufacturing engineer Strain: Low Risk  (11/18/2023)   Received from [facility]   Overall Financial Resource Strain (CARDIA)    How hard is it for you to pay for the very basics like food, housing, medical care, and heating?: Not hard at all Food Insecurity: No Food Insecurity (12/22/2023)   Received from [facility]   Hunger Vital Sign    Within the past 12 months, you worried that your food would run out before you got the money to buy more.: Never true    Within the past 12 months, the food you bought just didn't last and you didn't have money to get more.: Never true Transportation Needs: No Transportation Needs (12/22/2023)   Received from [facility] - Transportation     In the past 12 months, has lack of transportation kept you from medical appointments or from getting medications?: No    In the past 12 months, has lack of transportation kept you from meetings, work, or from getting things needed for daily living?: No Physical Activity: Inactive (11/18/2023)   Received from [facility]   Exercise Vital Sign    On average, how many days per week do you engage in moderate to strenuous exercise (like a brisk walk)?: 0 days    On average, how many minutes do you engage in exercise at this level?: 0 min Stress: No Stress Concern Present (11/18/2023)   Received from [facility] - Occupational Stress Questionnaire  Do you feel stress - tense, restless, nervous, or anxious, or unable to sleep at night because your mind is troubled all the time - these days?: Only a little Social Connections: Moderately Integrated (12/08/2023)   Received from [facility]   Social Connection and Isolation Panel    In a typical week, how many times do you talk on the phone with family, friends, or neighbors?: More than three times a week    How often do you get together with friends or relatives?: More than three times a week    How often do you attend church or religious services?: More than 4 times per year    Do you belong to any clubs or organizations such as church groups, unions, fraternal or athletic groups, or school groups?: No    How often do you attend meetings of the clubs or organizations you belong to?: Never    Are you married, widowed, divorced, separated, never married, or living with a partner?: Living with partner      Objective:   Vitals:   12/23/23 1401 12/23/23 1402 BP: 129/74   Pulse: (!) 115   Temp: 36.4 C (97.5 F)   SpO2: 98%   Weight: (!) 108.2 kg (238 lb 9.6 oz)   Height: 165.7 cm (5' 5.25)   PainSc:     3 PainLoc:   Abdomen   Body mass index is 39.4 kg/m.   Physical Exam Vitals reviewed.   Constitutional:      General: She is not in acute distress.   Eyes:     Conjunctiva/sclera: Conjunctivae normal.     Comments: Very mild scleral icterus    Cardiovascular:     Rate and Rhythm: Normal rate and regular rhythm.     Heart sounds: No murmur heard. Pulmonary:     Effort: Pulmonary effort is normal. No respiratory distress.     Breath sounds: Normal breath sounds. No wheezing.  Abdominal:     General: There is no distension.     Palpations: Abdomen is soft.     Tenderness: There is no abdominal tenderness.    Musculoskeletal:     Comments: Wheelchair bound    Skin:    General: Skin is warm and dry.    Neurological:     General: No focal deficit present.     Mental Status: She is alert and oriented to person, place, and time.          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 I personally reviewed her labs, imaging and referral notes. The mass in the uncinate appears to abut the distal SMV without vascular distortion. There is no evidence of arterial involvement. There also appears to be an adjacent cystic lesion. This is resectable disease however given the venous abutment and recent duodenitis/pancreatitis, I recommended neoadjuvant chemotherapy. Her case has been reviewed at multidisciplinary tumor board and this was also the consensus of the group. Dr. Cloretta is planning to treat with gemcitabine/Abraxane and port placement has been requested. I reviewed the details of portacath insertion including the benefits and the risks of bleeding, infection and pneumothorax. She expressed understanding and agrees to proceed. She will be scheduled for port placement next week. I will see her back after 6-8 cycles of chemotherapy with restaging scans. All questions were  answered.  Leonor Dawn, MD [facility] General, Hepatobiliary and Pancreatic Surgery 12/23/23 2:56 PM

## 2023-12-24 ENCOUNTER — Ambulatory Visit: Payer: Self-pay | Admitting: Oncology

## 2023-12-24 ENCOUNTER — Other Ambulatory Visit: Payer: Self-pay | Admitting: Oncology

## 2023-12-24 ENCOUNTER — Encounter (HOSPITAL_COMMUNITY): Payer: Self-pay | Admitting: Surgery

## 2023-12-24 ENCOUNTER — Other Ambulatory Visit: Payer: Self-pay

## 2023-12-24 ENCOUNTER — Inpatient Hospital Stay

## 2023-12-24 ENCOUNTER — Other Ambulatory Visit

## 2023-12-24 DIAGNOSIS — Z5111 Encounter for antineoplastic chemotherapy: Secondary | ICD-10-CM | POA: Diagnosis not present

## 2023-12-24 DIAGNOSIS — K219 Gastro-esophageal reflux disease without esophagitis: Secondary | ICD-10-CM | POA: Diagnosis not present

## 2023-12-24 DIAGNOSIS — C259 Malignant neoplasm of pancreas, unspecified: Secondary | ICD-10-CM

## 2023-12-24 DIAGNOSIS — Z8673 Personal history of transient ischemic attack (TIA), and cerebral infarction without residual deficits: Secondary | ICD-10-CM | POA: Diagnosis not present

## 2023-12-24 DIAGNOSIS — I10 Essential (primary) hypertension: Secondary | ICD-10-CM | POA: Diagnosis not present

## 2023-12-24 DIAGNOSIS — C250 Malignant neoplasm of head of pancreas: Secondary | ICD-10-CM | POA: Diagnosis not present

## 2023-12-24 DIAGNOSIS — E119 Type 2 diabetes mellitus without complications: Secondary | ICD-10-CM | POA: Diagnosis not present

## 2023-12-24 LAB — CMP (CANCER CENTER ONLY)
ALT: 86 U/L — ABNORMAL HIGH (ref 0–44)
AST: 52 U/L — ABNORMAL HIGH (ref 15–41)
Albumin: 3.3 g/dL — ABNORMAL LOW (ref 3.5–5.0)
Alkaline Phosphatase: 618 U/L — ABNORMAL HIGH (ref 38–126)
Anion gap: 12 (ref 5–15)
BUN: 6 mg/dL — ABNORMAL LOW (ref 8–23)
CO2: 23 mmol/L (ref 22–32)
Calcium: 8.8 mg/dL — ABNORMAL LOW (ref 8.9–10.3)
Chloride: 100 mmol/L (ref 98–111)
Creatinine: 0.68 mg/dL (ref 0.44–1.00)
GFR, Estimated: >60 mL/min (ref >60)
Glucose, Bld: 189 mg/dL — ABNORMAL HIGH (ref 70–99)
Potassium: 3.9 mmol/L (ref 3.5–5.1)
Sodium: 136 mmol/L (ref 135–145)
Total Bilirubin: 2 mg/dL — ABNORMAL HIGH (ref 0.0–1.2)
Total Protein: 6.9 g/dL (ref 6.5–8.1)

## 2023-12-24 LAB — CBC WITH DIFFERENTIAL (CANCER CENTER ONLY)
Abs Immature Granulocytes: 0.05 K/uL (ref 0.00–0.07)
Basophils Absolute: 0 K/uL (ref 0.0–0.1)
Basophils Relative: 0 %
Eosinophils Absolute: 0.1 K/uL (ref 0.0–0.5)
Eosinophils Relative: 1 %
HCT: 33.9 % — ABNORMAL LOW (ref 36.0–46.0)
Hemoglobin: 10.8 g/dL — ABNORMAL LOW (ref 12.0–15.0)
Immature Granulocytes: 1 %
Lymphocytes Relative: 21 %
Lymphs Abs: 1.5 K/uL (ref 0.7–4.0)
MCH: 25.1 pg — ABNORMAL LOW (ref 26.0–34.0)
MCHC: 31.9 g/dL (ref 30.0–36.0)
MCV: 78.8 fL — ABNORMAL LOW (ref 80.0–100.0)
Monocytes Absolute: 0.4 K/uL (ref 0.1–1.0)
Monocytes Relative: 6 %
Neutro Abs: 5.3 K/uL (ref 1.7–7.7)
Neutrophils Relative %: 71 %
Platelet Count: 332 K/uL (ref 150–400)
RBC: 4.3 MIL/uL (ref 3.87–5.11)
RDW: 18 % — ABNORMAL HIGH (ref 11.5–15.5)
WBC Count: 7.3 K/uL (ref 4.0–10.5)
nRBC: 0 % (ref 0.0–0.2)

## 2023-12-24 MED ORDER — PROCHLORPERAZINE MALEATE 10 MG PO TABS: 10.0000 mg | ORAL_TABLET | Freq: Four times a day (QID) | ORAL | 0 refills | Status: DC | PRN

## 2023-12-24 MED ORDER — LIDOCAINE-PRILOCAINE 2.5-2.5 % EX CREA: 1.0000 | TOPICAL_CREAM | CUTANEOUS | 0 refills | Status: DC | PRN

## 2023-12-24 MED ORDER — ONDANSETRON HCL 8 MG PO TABS: 8.0000 mg | ORAL_TABLET | Freq: Three times a day (TID) | ORAL | 0 refills | Status: DC | PRN

## 2023-12-24 NOTE — Progress Notes (Signed)
 PCP - Glade JINNY Hope, MD  EKG - DOS ECHO - 10/16/04   Fasting Blood Sugar - 102-152 Checks Blood Sugar 1/day  Anesthesia review: Y  Patient verbally denies any shortness of breath, fever, cough and chest pain during phone call   -------------  SDW INSTRUCTIONS given:  Your procedure is scheduled on Monday, July 21st.  Report to [facility] Main Entrance A at 0830 A.M., and check in at the Admitting office.  Call this number if you have problems the morning of surgery:  [phone]   Remember:  Do not eat after midnight the night before your surgery  You may drink clear liquids until 0800 the morning of your surgery.   Clear liquids allowed are: Water, Non-Citrus Juices (without pulp), Carbonated Beverages, Clear Tea, Black Coffee Only, and Gatorade    Take these medicines the morning of surgery with A SIP OF WATER  amLODipine  (NORVASC )  fluticasone  (FLONASE )  albuterol  (VENTOLIN  HFA)-if needed (Please bring on the day of surgery HYDROcodone -acetaminophen  (NORCO)-if needed  **glipiZIDE  (GLUCOTROL )** Please take morning dose on Sunday 07/20. DO NOT take evening dose. Do not take the day of surgery  **insulin  glargine (LANTUS )** If you take it in the morning, please take normal dose Sunday 07/20. Day of surgery please take HALF OF YOUR NORMAL dose of Lantus  as long as your blood sugar is above 70  ** PLEASE check your blood sugar the morning of your surgery when you wake up and every 2 hours until you get to the Short Stay unit.  If your blood sugar is less than 70 mg/dL, you will need to treat for low blood sugar: Do not take insulin . Treat a low blood sugar (less than 70 mg/dL) with  cup of clear juice (cranberry or apple), 4 glucose tablets, OR glucose gel. Recheck blood sugar in 15 minutes after treatment (to make sure it is greater than 70 mg/dL). If your blood sugar is not greater than 70 mg/dL on recheck, call [phone] for further instructions.   As of today,  STOP taking any Aspirin  (unless otherwise instructed by your surgeon) Aleve , Naproxen , Ibuprofen , Motrin , Advil , Goody's, BC's, all herbal medications, fish oil, and all vitamins.                      Do not wear jewelry, make up, or nail polish            Do not wear lotions, powders, perfumes/colognes, or deodorant.            Do not shave 48 hours prior to surgery.  Men may shave face and neck.            Do not bring valuables to the hospital.            [facility] is not responsible for any belongings or valuables.  Do NOT Smoke (Tobacco/Vaping) 24 hours prior to your procedure If you use a CPAP at night, you may bring all equipment for your overnight stay.   Contacts, glasses, dentures or bridgework may not be worn into surgery.      For patients admitted to the hospital, discharge time will be determined by your treatment team.   Patients discharged the day of surgery will not be allowed to drive home, and someone needs to stay with them for 24 hours.    Special instructions:   [place] [facility]- Preparing For Surgery  Before surgery, you can play an important role. Because skin is not sterile,  your skin needs to be as free of germs as possible. You can reduce the number of germs on your skin by washing with CHG (chlorahexidine gluconate) Soap before surgery.  CHG is an antiseptic cleaner which kills germs and bonds with the skin to continue killing germs even after washing.    Oral Hygiene is also important to reduce your risk of infection.  Remember - BRUSH YOUR TEETH THE MORNING OF SURGERY WITH YOUR REGULAR TOOTHPASTE  Please do not use if you have an allergy to CHG or antibacterial soaps. If your skin becomes reddened/irritated stop using the CHG.  Do not shave (including legs and underarms) for at least 48 hours prior to first CHG shower. It is OK to shave your face.  Please follow these instructions carefully.   Shower the NIGHT BEFORE SURGERY and the MORNING OF SURGERY with  DIAL Soap.   Pat yourself dry with a CLEAN TOWEL.  Wear CLEAN PAJAMAS to bed the night before surgery  Place CLEAN SHEETS on your bed the night of your first shower and DO NOT SLEEP WITH PETS.   Day of Surgery: Please shower morning of surgery  Wear Clean/Comfortable clothing the morning of surgery Do not apply any deodorants/lotions.   Remember to brush your teeth WITH YOUR REGULAR TOOTHPASTE.   Questions were answered. Patient verbalized understanding of instructions.

## 2023-12-24 NOTE — Progress Notes (Signed)
 PATIENT NAVIGATOR PROGRESS NOTE  Name: Lindsay Hardy Date: 12/24/2023 MRN: [id number]  DOB: 1950/07/11   Reason for visit:  /Pt education  Comments:  See education note and distress screening tool for note    Time spent counseling/coordinating care: > 60 minutes

## 2023-12-24 NOTE — Anesthesia Preprocedure Evaluation (Addendum)
 Anesthesia Evaluation  Patient identified by MRN, date of birth, ID band Patient awake    Reviewed: Allergy & Precautions, NPO status , Patient's Chart, lab work & pertinent test results  History of Anesthesia Complications Negative for: history of anesthetic complications  Airway Mallampati: II  TM Distance: >3 FB Neck ROM: Full    Dental  (+) Poor Dentition, Missing   Pulmonary asthma , neg sleep apnea, neg COPD, Patient abstained from smoking.Not current smoker, former smoker   Pulmonary exam normal breath sounds clear to auscultation       Cardiovascular Exercise Tolerance: Good METShypertension, + CAD and + Peripheral Vascular Disease  (-) Past MI (-) dysrhythmias  Rhythm:Regular Rate:Normal - Systolic murmurs    Neuro/Psych CVA, No Residual Symptoms  negative psych ROS   GI/Hepatic ,GERD  ,,(+)     (-) substance abuse  Pancreatic cancer   Endo/Other  diabetes, Poorly Controlled, Insulin  Dependent    Renal/GU negative Renal ROS     Musculoskeletal   Abdominal  (+) + obese  Peds  Hematology   Anesthesia Other Findings Patient is a 73 year old female scheduled for the above procedure. She had out-patient CT abd/pelvis due to abdominal pain on 10/28/23 that showed acute duodenal diverticulitis, but also an ill defined uncinate process of the pancreas felt likely reactive edema. Repeat CT on 12/02/23 showed persistent pancreatic process with main pancreatic ductal and extrahepatic bile duct dilation. LFTs with AST and ALT ~ 300 range with total bilirubin > 13, and she was referred to the ED on 12/07/23 and was admitted until 12/13/23. During admission she underwent MRCP that showed a hypoenhancing mass involving inferior pancreatic head and dilated pancreatic and CBD. GI was consulted, and she underwent ERCP on 12/08/23 and underwent biliary sphincterotomy along with placement of metal biliary stent into the CBD. Pathology  returned consistent with pancreatic adenocarcinoma. Out-patient oncology and general surgery follow-up arranged.      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     Since hospital discharge she saw Dr. Cloretta and Dr. Dasie. Although she was felt to have resectable disease, given the venous abutment and recent duodenitis/pancreatitis, Dr. Dasie and the multidisciplinary tumor board advised neoadjuvant chemotherapy. Dr. Cloretta is planning to treat with gemcitabine /Abraxane  and port placement requested. Dr. Dasie plans to see her back after 6-8 cycles of chemotherapy with restaging scans.    She is not routinely followed by cardiology. No cardiac testing noted in > 15 years. Last EKG noted was in 2021. Per Dr. Verla notes, patient denied chest pain, but activity is limited due to chronic back pain and primarily uses a wheelchair for mobility.    A1c 10.1% on 11/23/23. H/H 10.8/32.5, PLT 264 on 12/09/23. As of 12/11/23, glucose 290, Cr 0.92, albumin 2.6--LFTs elevated but improved to Alk phos 356, AsT 79, ALT 191, total bilirubin 5.2.      She needs port to begin chemotherapy for new diagnosis of pancreatic cancer. Unclear at this point if she will be a candidate for a Whipple in the future, but if so would likely need preoperative cardiology evaluation given her history. Discussed with anesthesiologist Corinne Senior, MD who was in agreement. Message sent to Dr. Dasie  with CC: to PCP Dr. Geofm. For now, she is plan is for [facility] placement so she can begin chemotherapy. Anesthesia team to evaluate on the day of surgery. Updated  labs and EKG on arrival as indicated.   Reproductive/Obstetrics                              Anesthesia Physical Anesthesia Plan  ASA: 3  Anesthesia Plan: General   Post-op Pain Management:    Induction: Intravenous  PONV Risk Score and Plan: 3 and Ondansetron  and Dexamethasone   Airway Management Planned: LMA  Additional Equipment: None  Intra-op Plan:   Post-operative Plan: Extubation in OR  Informed Consent: I have reviewed the patients History and Physical, chart, labs and discussed the procedure including the risks, benefits and alternatives for the proposed anesthesia with the patient or authorized representative who has indicated his/her understanding and acceptance.     Dental advisory given  Plan Discussed with: CRNA and Surgeon  Anesthesia Plan Comments: (Discussed risks of anesthesia with patient, including PONV, sore throat, lip/dental/eye damage. Rare risks discussed as well, such as cardiorespiratory and neurological sequelae, and allergic reactions. Discussed the role of CRNA in patient's perioperative care. Patient understands.)         Anesthesia Quick Evaluation

## 2023-12-24 NOTE — Progress Notes (Signed)
 Anesthesia Chart Review: SAME DAY WORK-UP   Case: [id number] Date/Time: 12/27/23 1045   Procedure: INSERTION, TUNNELED CENTRAL VENOUS DEVICE, WITH PORT - GEN PORT-A-CATH INSERTION WITH ULTRASOUND GUIDANCE   Anesthesia type: General   Pre-op diagnosis: PANCREATIC CANCER   Location: [facility] ROOM 02 / [facility]   Surgeons: Dasie Leonor CROME, MD       DISCUSSION: Patient is a 73 year old female scheduled for the above procedure. She had out-patient CT abd/pelvis due to abdominal pain on 10/28/23 that showed acute duodenal diverticulitis, but also an ill defined uncinate process of the pancreas felt likely reactive edema. Repeat CT on 12/02/23 showed persistent pancreatic process with main pancreatic ductal and extrahepatic bile duct dilation. LFTs with AST and ALT ~ 300 range with total bilirubin > 13, and she was referred to the ED on 12/07/23 and was admitted until 12/13/23. During admission she underwent MRCP that showed a hypoenhancing mass involving inferior pancreatic head and dilated pancreatic and CBD. GI was consulted, and she underwent ERCP on 12/08/23 and underwent biliary sphincterotomy along with placement of metal biliary stent into the CBD. Pathology returned consistent with pancreatic adenocarcinoma. Out-patient oncology and general surgery follow-up arranged.    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   Since hospital discharge she saw Dr. Cloretta and Dr. Dasie. Although she was felt to have resectable disease, given the venous abutment and recent duodenitis/pancreatitis, Dr. Dasie and the multidisciplinary tumor board  advised neoadjuvant chemotherapy. Dr. Cloretta is planning to treat with gemcitabine/Abraxane and port placement requested. Dr. Dasie plans to see her back after 6-8 cycles of chemotherapy with restaging scans.   She is not routinely followed by cardiology. No cardiac testing noted in > 15 years. Last EKG noted was in 2021. Per Dr. Verla notes, patient denied chest pain, but activity is limited due to chronic back pain and primarily uses a wheelchair for mobility.   A1c 10.1% on 11/23/23. H/H 10.8/32.5, PLT 264 on 12/09/23. As of 12/11/23, glucose 290, Cr 0.92, albumin 2.6--LFTs elevated but improved to Alk phos 356, AsT 79, ALT 191, total bilirubin 5.2.    She needs port to begin chemotherapy for new diagnosis of pancreatic cancer. Unclear at this point if she will be a candidate for a Whipple in the future, but if so would likely need preoperative cardiology evaluation given her history. Discussed with anesthesiologist Corinne Senior, MD who was in agreement. Message sent to Dr. Dasie with CC: to PCP Dr. Geofm. For now, she is plan is for [facility] placement so she can begin chemotherapy. Anesthesia team to evaluate on the day of surgery. Updated labs and EKG on arrival as indicated.   VS:  Wt Readings from Last 3 Encounters:  12/16/23 108.4 kg  12/08/23 111.3 kg  11/18/23 114.8 kg   BP Readings from Last 3 Encounters:  12/16/23 119/74  12/13/23 122/64  11/23/23 (!) 164/94   Pulse Readings from Last 3 Encounters:  12/16/23 100  12/13/23 (!) 104  11/23/23 94     PROVIDERS: Geofm Glade PARAS, MD is PCP  Cloretta Arley NOVAK, MD is HEM-ONC   LABS: For day of surgery as indicated. Most recent results in [facility] include: Lab Results  Component Value Date   WBC 8.0 12/09/2023   HGB 10.8 (L) 12/09/2023   HCT 32.5 (L) 12/09/2023   PLT 264 12/09/2023   GLUCOSE 153 (H) 12/13/2023   CHOL 152 07/19/2023   TRIG 58.0 07/19/2023   HDL 43.10 07/19/2023   LDLCALC 97 07/19/2023   ALT 147 (H) 12/13/2023    AST 70 (H) 12/13/2023   NA 133 (L) 12/13/2023   K 3.6 12/13/2023   CL 100 12/13/2023   CREATININE 0.75 12/13/2023   BUN 7 (L) 12/13/2023   CO2 23 12/13/2023   TSH 1.84 08/06/2020   INR 1.0 12/07/2023   HGBA1C 10.1 (H) 11/23/2023   MICROALBUR 10.3 (H) 10/27/2023    IMAGES: CT Chest 12/09/23: IMPRESSION: 1. No evidence for metastatic disease in the chest. 2. Stable scarring in the suprahilar right lung apex. 3. Pneumobilia in this patient status post ERCP on 12/08/2023. 4. Emphysema (ICD10-J43.9) and Aortic Atherosclerosis (ICD10-170.0)  ERCP 12/08/23: IMPRESSION: High-grade stricture of the distal common bile duct with placement of self expanding metallic biliary stent.  MR Abd/MRCP 12/07/23: IMPRESSION: 1. Hypoenhancing mass in the inferior pancreatic head and uncinate measuring 3.4 x 2.3 cm, consistent with pancreatic adenocarcinoma. 2. There is abrupt effacement of both the pancreatic and common bile ducts by mass with upstream dilatation of the pancreatic duct measuring up to 0.8 cm. 3. Severe intra and extrahepatic biliary ductal dilatation, the common bile duct measuring up to 1.4 cm and as above, abruptly effaced above the level of the ampulla by mass. 4. Mass closely abuts the central superior mesenteric vein anteriorly with less than 50% contact. Fat planes are preserved to the superior mesenteric artery. 5. No evidence of lymphadenopathy or metastatic disease in the abdomen.    EKG: Last EKG noted is from 11/23/19 and showed: Sinus tachycardia, low voltage QRS, cannot rule out Anterior infarct , age undetermined.   CV: Nuclear stress test 03/26/06: IMPRESSION:  1. Negative for pharmacologic-stress induced ischemia.  2. Left ventricular ejection fraction 63 %   Echo 10/16/04: SUMMARY   -  Overall left ventricular systolic function was normal. Left ventricular ejection fraction was estimated , range being 55 % to 65 %. There were no left ventricular regional wall  motion abnormalities. Doppler parameters were consistent with abnormal left ventricular relaxation; this left ventricular filling pattern is also seen with aging.   -  This study was inadequate to rule out the possibility of a bicuspid aortic valve. There was normal aortic valve leaflet excursion.   Cardiac cath 11/23/03: - LAD: Diffuse luminal irregularities throughout the proximal mid vessel.  LAD gives rise to 3 small diagonal branches. - LCX: Normal.  LCx gives rise to small OM1 branch and large branching OM2. - RCA: High anterior takeoff.  Very torturous.  60% mid stenosis with some mild haziness consistent with possible ruptured plaque.  Beyond this, RCA gives rise to a small PDA. IMPRESSIONS: 1. Normal left ventricular systolic function. 2. One vessel coronary artery disease characterized by a hazy 60% stenosis in the mid right coronary artery consistent with possible ruptured plaque. RECOMMENDATION:  Aggressive medical therapy to include aggressive risk factor modification.  Would also recommend use of Plavix for 9-12 months for the patient's acute coronary syndrome, treating her cholesterol for a goal LDL cholesterol of less than 100 and treating her blood pressure for a goal systolic pressure of 120-130.   Past Medical History:  Diagnosis Date   Asthma    CAD (coronary artery disease) 11/07/2003  RCA 60% stenosis, LAD diffuse   Cancer (HCC)    Pancreatic   Chicken pox    CVA (cerebrovascular accident) (HCC) 06/09/1999    right frontoparietal cortical CVA - MRI September 2001   Diabetes mellitus    Diastolic dysfunction     ejection fraction 55-65% on 2-D echo May 2006   Empty sella syndrome [facility])     based on MRI September 2001, related to obesity, hypertension, it was determined that patient has not required treatment but will be monitored with TSH, ACTH, cortisol, testosterone, prolactin, growth hormone   GERD (gastroesophageal reflux disease)     EGD June 2004 -  followed  by Dr. Avram   Granulomatous disease, chronic (HCC)     in right upper lobe per x-ray December 2005   Hyperlipidemia    Hypertension    NAFLD (nonalcoholic fatty liver disease)     based on ultrasound June 2002, elevated transaminase   OA (osteoarthritis)     DJP coracoclavicular ligament, left patella osteophytes June 2002, L4-S1 spondylosis, degenerative disc disease with bulge. I in October 2001, the C2-C4 spondylosis without stenosis and with spurs based on x-ray July 2005; diffuse idiopathic skeletal hyperostosis with bilateral hip lumbar degenerative changes   on x-ray February 2005, bilateral plantar calcaneal spurs   Reactive airway disease     peak flow 180 in May 2005, chronic sinusitis with PND, bronchitis December 2002 and strep pneumonia April 2007,  FEC 66, MCV 60, ratio is 69, DLCO 57,  moderate restrictive and mild obstructive disease  on spirometry May 2005    Past Surgical History:  Procedure Laterality Date   BILIARY BRUSHING  12/08/2023   Procedure: BRUSH BIOPSY, BILE DUCT;  Surgeon: Rosalie Kitchens, MD;  Location: [facility] ENDOSCOPY;  Service: Gastroenterology;;   BILIARY STENT PLACEMENT  12/08/2023   Procedure: INSERTION, STENT, BILE DUCT;  Surgeon: Rosalie Kitchens, MD;  Location: [facility] ENDOSCOPY;  Service: Gastroenterology;;   ORIN MEDIATE RELEASE  1992    left arm 1992   CHOLECYSTECTOMY  2003   ERCP N/A 12/08/2023   Procedure: ERCP, WITH INTERVENTION IF INDICATED;  Surgeon: Rosalie Kitchens, MD;  Location: [facility] ENDOSCOPY;  Service: Gastroenterology;  Laterality: N/A;   TONSILLECTOMY AND ADENOIDECTOMY      MEDICATIONS: No current facility-administered medications for this encounter.    albuterol  (VENTOLIN  HFA) 108 (90 Base) MCG/ACT inhaler   amLODipine  (NORVASC ) 10 MG tablet   cyanocobalamin  (VITAMIN B12) 500 MCG tablet   fluticasone  (FLONASE ) 50 MCG/ACT nasal spray   glipiZIDE  (GLUCOTROL ) 10 MG tablet   hydrochlorothiazide  (HYDRODIURIL ) 25 MG tablet   HYDROcodone -acetaminophen   (NORCO) 10-325 MG tablet   ibuprofen  (ADVIL ) 800 MG tablet   insulin  glargine (LANTUS ) 100 UNIT/ML Solostar Pen   linaclotide  (LINZESS ) 145 MCG CAPS capsule   metFORMIN  (GLUCOPHAGE -XR) 500 MG 24 hr tablet   montelukast  (SINGULAIR ) 10 MG tablet   naloxone  (NARCAN ) nasal spray 4 mg/0.1 mL   potassium chloride  (MICRO-K ) 10 MEQ CR capsule    Isaiah Ruder, PA-C Surgical Short Stay/Anesthesiology [facility] Phone [phone] [facility] Phone [phone] 12/24/2023 12:10 PM

## 2023-12-27 ENCOUNTER — Encounter (HOSPITAL_COMMUNITY)
Admission: RE | Disposition: A | Discharge status: Home / Self Care | Payer: Self-pay | Source: Home / Self Care | Attending: Surgery

## 2023-12-27 ENCOUNTER — Ambulatory Visit (HOSPITAL_COMMUNITY)

## 2023-12-27 ENCOUNTER — Ambulatory Visit (HOSPITAL_COMMUNITY): Payer: Self-pay | Admitting: Vascular Surgery

## 2023-12-27 ENCOUNTER — Other Ambulatory Visit: Payer: Self-pay

## 2023-12-27 ENCOUNTER — Ambulatory Visit (HOSPITAL_COMMUNITY)
Admission: RE | Admit: 2023-12-27 | Discharge: 2023-12-27 | Disposition: A | Discharge status: Home / Self Care | Attending: Surgery | Admitting: Surgery

## 2023-12-27 DIAGNOSIS — R9389 Abnormal findings on diagnostic imaging of other specified body structures: Secondary | ICD-10-CM | POA: Diagnosis not present

## 2023-12-27 DIAGNOSIS — Z87891 Personal history of nicotine dependence: Secondary | ICD-10-CM | POA: Insufficient documentation

## 2023-12-27 DIAGNOSIS — K760 Fatty (change of) liver, not elsewhere classified: Secondary | ICD-10-CM | POA: Diagnosis not present

## 2023-12-27 DIAGNOSIS — Z794 Long term (current) use of insulin: Secondary | ICD-10-CM | POA: Diagnosis not present

## 2023-12-27 DIAGNOSIS — E785 Hyperlipidemia, unspecified: Secondary | ICD-10-CM | POA: Diagnosis not present

## 2023-12-27 DIAGNOSIS — E119 Type 2 diabetes mellitus without complications: Secondary | ICD-10-CM | POA: Insufficient documentation

## 2023-12-27 DIAGNOSIS — I2510 Atherosclerotic heart disease of native coronary artery without angina pectoris: Secondary | ICD-10-CM

## 2023-12-27 DIAGNOSIS — Z8673 Personal history of transient ischemic attack (TIA), and cerebral infarction without residual deficits: Secondary | ICD-10-CM | POA: Insufficient documentation

## 2023-12-27 DIAGNOSIS — Z9049 Acquired absence of other specified parts of digestive tract: Secondary | ICD-10-CM | POA: Insufficient documentation

## 2023-12-27 DIAGNOSIS — Z7984 Long term (current) use of oral hypoglycemic drugs: Secondary | ICD-10-CM | POA: Insufficient documentation

## 2023-12-27 DIAGNOSIS — I10 Essential (primary) hypertension: Secondary | ICD-10-CM | POA: Diagnosis not present

## 2023-12-27 DIAGNOSIS — C259 Malignant neoplasm of pancreas, unspecified: Secondary | ICD-10-CM

## 2023-12-27 DIAGNOSIS — J45909 Unspecified asthma, uncomplicated: Secondary | ICD-10-CM | POA: Diagnosis not present

## 2023-12-27 DIAGNOSIS — G8929 Other chronic pain: Secondary | ICD-10-CM | POA: Insufficient documentation

## 2023-12-27 DIAGNOSIS — J439 Emphysema, unspecified: Secondary | ICD-10-CM | POA: Insufficient documentation

## 2023-12-27 DIAGNOSIS — C250 Malignant neoplasm of head of pancreas: Secondary | ICD-10-CM | POA: Diagnosis not present

## 2023-12-27 DIAGNOSIS — Z5111 Encounter for antineoplastic chemotherapy: Secondary | ICD-10-CM | POA: Diagnosis not present

## 2023-12-27 DIAGNOSIS — Z452 Encounter for adjustment and management of vascular access device: Secondary | ICD-10-CM | POA: Diagnosis not present

## 2023-12-27 DIAGNOSIS — K219 Gastro-esophageal reflux disease without esophagitis: Secondary | ICD-10-CM | POA: Diagnosis not present

## 2023-12-27 HISTORY — DX: Cataract extraction status, right eye: Z98.41

## 2023-12-27 HISTORY — DX: Malignant (primary) neoplasm, unspecified: C80.1

## 2023-12-27 HISTORY — DX: Cataract extraction status, right eye: Z98.42

## 2023-12-27 LAB — GLUCOSE, CAPILLARY
Glucose-Capillary: 166 mg/dL — ABNORMAL HIGH (ref 70–99)
Glucose-Capillary: 141 mg/dL — ABNORMAL HIGH (ref 70–99)
Glucose-Capillary: 146 mg/dL — ABNORMAL HIGH (ref 70–99)

## 2023-12-27 SURGERY — INSERTION, TUNNELED CENTRAL VENOUS DEVICE, WITH PORT
Anesthesia: General | Site: Chest | Laterality: Left

## 2023-12-27 MED ORDER — ONDANSETRON HCL 4 MG/2ML IJ SOLN
INTRAMUSCULAR | Status: DC | PRN
  Administered 2023-12-27: 4 mg via INTRAVENOUS

## 2023-12-27 MED ORDER — CEFAZOLIN SODIUM-DEXTROSE 2-4 GM/100ML-% IV SOLN
INTRAVENOUS | Status: AC
  Filled 2023-12-27: qty 100

## 2023-12-27 MED ORDER — ONDANSETRON HCL 4 MG/2ML IJ SOLN: 4.0000 mg | Freq: Once | INTRAMUSCULAR | Status: DC | PRN

## 2023-12-27 MED ORDER — HEPARIN SOD (PORK) LOCK FLUSH 100 UNIT/ML IV SOLN
INTRAVENOUS | Status: DC | PRN
  Administered 2023-12-27: 500 [IU] via INTRAVENOUS

## 2023-12-27 MED ORDER — OXYCODONE HCL 5 MG PO TABS: 5.0000 mg | ORAL_TABLET | Freq: Once | ORAL | Status: AC | PRN

## 2023-12-27 MED ORDER — ROCURONIUM BROMIDE 10 MG/ML (PF) SYRINGE
PREFILLED_SYRINGE | INTRAVENOUS | Status: AC
Start: 2023-12-27 — End: 2023-12-27
  Filled 2023-12-27: qty 10

## 2023-12-27 MED ORDER — DEXAMETHASONE SODIUM PHOSPHATE 10 MG/ML IJ SOLN
INTRAMUSCULAR | Status: DC | PRN
  Administered 2023-12-27: 5 mg via INTRAVENOUS

## 2023-12-27 MED ORDER — FENTANYL CITRATE (PF) 100 MCG/2ML IJ SOLN
25.0000 ug | INTRAMUSCULAR | Status: DC | PRN
  Administered 2023-12-27 (×3): 25 ug via INTRAVENOUS

## 2023-12-27 MED ORDER — INSULIN ASPART 100 UNIT/ML IJ SOLN: 0.0000 [IU] | INTRAMUSCULAR | Status: DC | PRN

## 2023-12-27 MED ORDER — CEFAZOLIN SODIUM-DEXTROSE 2-4 GM/100ML-% IV SOLN
2.0000 g | INTRAVENOUS | Status: AC
  Administered 2023-12-27: 2 g via INTRAVENOUS

## 2023-12-27 MED ORDER — PHENYLEPHRINE HCL-NACL 20-0.9 MG/250ML-% IV SOLN
INTRAVENOUS | Status: DC | PRN
  Administered 2023-12-27: 30 ug/min via INTRAVENOUS

## 2023-12-27 MED ORDER — FENTANYL CITRATE (PF) 250 MCG/5ML IJ SOLN
INTRAMUSCULAR | Status: AC
Start: 2023-12-27 — End: 2023-12-27
  Filled 2023-12-27: qty 5

## 2023-12-27 MED ORDER — CHLORHEXIDINE GLUCONATE 0.12 % MT SOLN
15.0000 mL | Freq: Once | OROMUCOSAL | Status: AC
  Administered 2023-12-27: 15 mL via OROMUCOSAL
  Filled 2023-12-27: qty 15

## 2023-12-27 MED ORDER — ACETAMINOPHEN 10 MG/ML IV SOLN
1000.0000 mg | Freq: Once | INTRAVENOUS | Status: DC | PRN
  Administered 2023-12-27: 1000 mg via INTRAVENOUS

## 2023-12-27 MED ORDER — PROPOFOL 10 MG/ML IV BOLUS
INTRAVENOUS | Status: AC
  Filled 2023-12-27: qty 20

## 2023-12-27 MED ORDER — OXYCODONE HCL 5 MG/5ML PO SOLN
5.0000 mg | Freq: Once | ORAL | Status: AC | PRN
  Administered 2023-12-27: 5 mg via ORAL

## 2023-12-27 MED ORDER — DEXAMETHASONE SODIUM PHOSPHATE 10 MG/ML IJ SOLN
INTRAMUSCULAR | Status: AC
Start: 2023-12-27 — End: 2023-12-27
  Filled 2023-12-27: qty 1

## 2023-12-27 MED ORDER — PHENYLEPHRINE 80 MCG/ML (10ML) SYRINGE FOR IV PUSH (FOR BLOOD PRESSURE SUPPORT)
PREFILLED_SYRINGE | INTRAVENOUS | Status: DC | PRN
  Administered 2023-12-27 (×5): 80 ug via INTRAVENOUS

## 2023-12-27 MED ORDER — PROPOFOL 10 MG/ML IV BOLUS
INTRAVENOUS | Status: DC | PRN
  Administered 2023-12-27: 50 mg via INTRAVENOUS
  Administered 2023-12-27: 100 mg via INTRAVENOUS

## 2023-12-27 MED ORDER — FENTANYL CITRATE (PF) 100 MCG/2ML IJ SOLN
INTRAMUSCULAR | Status: AC
  Filled 2023-12-27: qty 2

## 2023-12-27 MED ORDER — HEPARIN SOD (PORK) LOCK FLUSH 100 UNIT/ML IV SOLN
INTRAVENOUS | Status: AC
  Filled 2023-12-27: qty 5

## 2023-12-27 MED ORDER — SUCCINYLCHOLINE CHLORIDE 200 MG/10ML IV SOSY
PREFILLED_SYRINGE | INTRAVENOUS | Status: DC | PRN
  Administered 2023-12-27: 120 mg via INTRAVENOUS

## 2023-12-27 MED ORDER — LACTATED RINGERS IV SOLN: INTRAVENOUS | Status: DC

## 2023-12-27 MED ORDER — ONDANSETRON HCL 4 MG/2ML IJ SOLN
INTRAMUSCULAR | Status: AC
Start: 2023-12-27 — End: 2023-12-27
  Filled 2023-12-27: qty 2

## 2023-12-27 MED ORDER — ORAL CARE MOUTH RINSE
15.0000 mL | Freq: Once | OROMUCOSAL | Status: AC
Start: 2023-12-27 — End: 2023-12-27

## 2023-12-27 MED ORDER — ACETAMINOPHEN 10 MG/ML IV SOLN
INTRAVENOUS | Status: AC
  Filled 2023-12-27: qty 100

## 2023-12-27 MED ORDER — BUPIVACAINE-EPINEPHRINE (PF) 0.25% -1:200000 IJ SOLN
INTRAMUSCULAR | Status: AC
  Filled 2023-12-27: qty 30

## 2023-12-27 MED ORDER — HEPARIN 6000 UNIT IRRIGATION SOLUTION
Status: DC | PRN
  Administered 2023-12-27: 1

## 2023-12-27 MED ORDER — HEPARIN 6000 UNIT IRRIGATION SOLUTION
Status: AC
  Filled 2023-12-27: qty 500

## 2023-12-27 MED ORDER — OXYCODONE HCL 5 MG/5ML PO SOLN
ORAL | Status: AC
Start: 2023-12-27 — End: 2023-12-27
  Filled 2023-12-27: qty 5

## 2023-12-27 MED ORDER — FENTANYL CITRATE (PF) 250 MCG/5ML IJ SOLN
INTRAMUSCULAR | Status: DC | PRN
  Administered 2023-12-27 (×4): 50 ug via INTRAVENOUS

## 2023-12-27 SURGICAL SUPPLY — 32 items
BAG COUNTER SPONGE SURGICOUNT (BAG) ×2 IMPLANT
BAG DECANTER FOR FLEXI CONT (MISCELLANEOUS) ×2 IMPLANT
CHLORAPREP W/TINT 26 (MISCELLANEOUS) ×2 IMPLANT
COVER SURGICAL LIGHT HANDLE (MISCELLANEOUS) ×2 IMPLANT
COVER TRANSDUCER ULTRASND GEL (DISPOSABLE) IMPLANT
DERMABOND ADVANCED .7 DNX12 (GAUZE/BANDAGES/DRESSINGS) ×2 IMPLANT
DRAPE C-ARM 42X120 X-RAY (DRAPES) ×2 IMPLANT
DRAPE LAPAROSCOPIC ABDOMINAL (DRAPES) ×2 IMPLANT
ELECT CAUTERY BLADE 6.4 (BLADE) ×2 IMPLANT
ELECTRODE REM PT RTRN 9FT ADLT (ELECTROSURGICAL) ×2 IMPLANT
GEL ULTRASOUND 20GR AQUASONIC (MISCELLANEOUS) IMPLANT
GLOVE BIOGEL PI IND STRL 6 (GLOVE) ×2 IMPLANT
GLOVE SURG SYN 5.5 PF PI (GLOVE) ×2 IMPLANT
GOWN STRL REUS W/ TWL LRG LVL3 (GOWN DISPOSABLE) ×4 IMPLANT
INTRODUCER COOK 11FR (CATHETERS) IMPLANT
KIT BASIN OR (CUSTOM PROCEDURE TRAY) ×2 IMPLANT
KIT PORT POWER 8FR ISP CVUE (Port) IMPLANT
KIT TURNOVER KIT B (KITS) ×2 IMPLANT
NS IRRIG 1000ML POUR BTL (IV SOLUTION) ×2 IMPLANT
PAD ARMBOARD POSITIONER FOAM (MISCELLANEOUS) ×2 IMPLANT
PENCIL BUTTON HOLSTER BLD 10FT (ELECTRODE) ×2 IMPLANT
POSITIONER HEAD DONUT 9IN (MISCELLANEOUS) ×2 IMPLANT
SET INTRODUCER 12FR PACEMAKER (INTRODUCER) IMPLANT
SET SHEATH INTRODUCER 10FR (MISCELLANEOUS) IMPLANT
SHEATH COOK PEEL AWAY SET 9F (SHEATH) IMPLANT
SUT MNCRL AB 4-0 PS2 18 (SUTURE) ×2 IMPLANT
SUT PROLENE 2 0 SH DA (SUTURE) ×4 IMPLANT
SUT VIC AB 3-0 SH 27X BRD (SUTURE) ×2 IMPLANT
SYR 5ML LUER SLIP (SYRINGE) ×2 IMPLANT
TOWEL GREEN STERILE (TOWEL DISPOSABLE) ×2 IMPLANT
TOWEL GREEN STERILE FF (TOWEL DISPOSABLE) ×2 IMPLANT
TRAY LAPAROSCOPIC MC (CUSTOM PROCEDURE TRAY) ×2 IMPLANT

## 2023-12-27 NOTE — Discharge Instructions (Signed)
SURGERY DISCHARGE INSTRUCTIONS: PORT-A-CATH PLACEMENT ° °Activity °You may resume your usual activities as tolerated °Ok to shower in 48 hours, but do not bathe or submerge incisions underwater. °Do not drive while taking narcotic pain medication. ° °Wound Care °Your incision is covered with skin glue called Dermabond. This will peel off on its own over time. °You may shower and allow warm soapy water to run over your incisions. Gently pat dry. °Do not submerge your incision underwater. °Monitor your incision for any new redness, tenderness, or drainage. °You may start using your port in 48 hours. Do not apply EMLA cream directly over the Dermabond (skin glue). ° °When to Call Us: °Fever greater than 100.5 °New redness, drainage, or swelling at incision site °Severe pain, nausea, or vomiting °Shortness of breath, difficulty breathing ° °For questions or concerns, please call the [facility] office at [phone]. ° °

## 2023-12-27 NOTE — Therapy (Incomplete)
 OUTPATIENT PHYSICAL THERAPY  LOWER EXTREMITY ONCOLOGY EVALUATION  Patient Name: Lindsay Hardy MRN: [id number] DOB:Oct 28, 1950, 73 y.o., female Today's Date: 12/27/2023  END OF SESSION:   Past Medical History:  Diagnosis Date   Asthma    CAD (coronary artery disease) 11/07/2003    RCA 60% stenosis, LAD diffuse   Cancer (HCC)    Pancreatic   Chicken pox    CVA (cerebrovascular accident) (HCC) 06/09/1999    right frontoparietal cortical CVA - MRI September 2001   Diabetes mellitus    Diastolic dysfunction     ejection fraction 55-65% on 2-D echo May 2006   Empty sella syndrome [facility])     based on MRI September 2001, related to obesity, hypertension, it was determined that patient has not required treatment but will be monitored with TSH, ACTH, cortisol, testosterone, prolactin, growth hormone   GERD (gastroesophageal reflux disease)     EGD June 2004 -  followed by Dr. Avram   Granulomatous disease, chronic (HCC)     in right upper lobe per x-ray December 2005   H/O bilateral cataract extraction    Hyperlipidemia    Hypertension    NAFLD (nonalcoholic fatty liver disease)     based on ultrasound June 2002, elevated transaminase   OA (osteoarthritis)     DJP coracoclavicular ligament, left patella osteophytes June 2002, L4-S1 spondylosis, degenerative disc disease with bulge. I in October 2001, the C2-C4 spondylosis without stenosis and with spurs based on x-ray July 2005; diffuse idiopathic skeletal hyperostosis with bilateral hip lumbar degenerative changes   on x-ray February 2005, bilateral plantar calcaneal spurs   Reactive airway disease     peak flow 180 in May 2005, chronic sinusitis with PND, bronchitis December 2002 and strep pneumonia April 2007,  FEC 66, MCV 60, ratio is 69, DLCO 57,  moderate restrictive and mild obstructive disease  on spirometry May 2005   Past Surgical History:  Procedure Laterality Date   BILIARY BRUSHING  12/08/2023   Procedure: BRUSH BIOPSY,  BILE DUCT;  Surgeon: Rosalie Kitchens, MD;  Location: [facility] ENDOSCOPY;  Service: Gastroenterology;;   BILIARY STENT PLACEMENT  12/08/2023   Procedure: INSERTION, STENT, BILE DUCT;  Surgeon: Rosalie Kitchens, MD;  Location: [facility] ENDOSCOPY;  Service: Gastroenterology;;   ORIN MEDIATE RELEASE  1992    left arm 1992   CHOLECYSTECTOMY  2003   ERCP N/A 12/08/2023   Procedure: ERCP, WITH INTERVENTION IF INDICATED;  Surgeon: Rosalie Kitchens, MD;  Location: [facility] ENDOSCOPY;  Service: Gastroenterology;  Laterality: N/A;   TONSILLECTOMY AND ADENOIDECTOMY     Patient Active Problem List   Diagnosis Date Noted   Cancer of head of pancreas (HCC) 12/16/2023   Elevated LFTs 12/08/2023   Type 2 diabetes mellitus with hyperglycemia (HCC) 12/08/2023   Obstructive jaundice 12/08/2023   Jaundice 12/07/2023   Elevated lipase 11/23/2023   Abnormal CT scan, gastrointestinal tract 11/23/2023   Diverticulitis of duodenum 10/28/2023   Urinary frequency 10/27/2023   Lower abdominal pain 10/27/2023   Gastroenteritis 07/19/2023   Bilateral low back pain without sciatica 03/23/2023   Asthma 11/04/2022   B12 deficiency 11/03/2022   Vaginal candidiasis 05/06/2022   Statin myopathy 01/15/2022   Aortic atherosclerosis (HCC) 11/05/2020   Arthropathy of lumbar facet joint 02/27/2020   Allergic rhinitis 11/22/2018   External hemorrhoids 10/07/2018   Chronic constipation 10/07/2018   Morbid obesity (HCC) 04/01/2017   PAD (peripheral artery disease) (HCC) 10/29/2016   Chronic back pain 09/23/2016   Diabetic peripheral neuropathy (HCC) 02/05/2016  Dysuria 01/14/2016   Hypokalemia 04/24/2007   Microcytic anemia 06/27/2006   Diabetes (HCC) 06/17/2006   Hyperlipidemia 06/17/2006   Essential hypertension 06/17/2006   Coronary atherosclerosis 06/17/2006   DIASTOLIC DYSFUNCTION 06/17/2006   DISEASE, CEREBROVASCULAR NEC 06/17/2006   Osteoarthritis of both knees 06/17/2006    PCP: Glade Hope, MD  REFERRING PROVIDER: Arley Hof,  MD  REFERRING DIAG:  Diagnosis  C25.9 (ICD-10-CM) - Pancreatic adenocarcinoma (HCC)    THERAPY DIAG:  No diagnosis found.  ONSET DATE: 10/07/23  Rationale for Evaluation and Treatment: Rehabilitation  SUBJECTIVE:                                                                                                                                                                                           SUBJECTIVE STATEMENT: ***  PERTINENT HISTORY: new diagnosis of pancreatic cancer. Will be starting chemotherapy and then have surgical resection. OA, asthma, HTN, chronic back pain that limits ambulation - mostly uses RW.   PAIN:  Are you having pain? {yes/no:20286} NPRS scale: ***/10 Pain location: *** Pain orientation: {Pain Orientation:25161}  PAIN TYPE: {type:313116} Pain description: {PAIN DESCRIPTION:21022940}  Aggravating factors: *** Relieving factors: ***  PRECAUTIONS: {Therapy precautions:24002}  RED FLAGS: {PT Red Flags:29287}   WEIGHT BEARING RESTRICTIONS: {Yes ***/No:24003}  FALLS:  Has patient fallen in last 6 months? {fallsyesno:27318}  LIVING ENVIRONMENT: Lives with: {[facility] lives with:25569::lives with their family} Lives in: {Lives in:25570} Stairs: {yes/no:20286}; {Stairs:24000} Has following equipment at home: {Assistive devices:23999}  OCCUPATION: ***  LEISURE: ***  PRIOR LEVEL OF FUNCTION: {PLOF:24004}  PATIENT GOALS: ***   OBJECTIVE: Note: Objective measures were completed at Evaluation unless otherwise noted.  COGNITION: Overall cognitive status: {cognition:24006}   PALPATION: ***  OBSERVATIONS / OTHER ASSESSMENTS: ***  SENSATION: Light touch: {intact/deficits:24005} Stereognosis: {intact/deficits:24005} Hot/Cold: {intact/deficits:24005} Proprioception: {intact/deficits:24005}  POSTURE: ***  LOWER EXTREMITY STRENGTH:  MMT Right eval  Hip flexion   Hip extension   Hip abduction   Hip adduction   Hip internal rotation   Hip  external rotation   Knee flexion   Knee extension   Ankle dorsiflexion   Ankle plantarflexion   Ankle inversion   Ankle eversion   Great toe extension    (Blank rows = not tested)  MMT LEFT eval  Hip flexion   Hip extension   Hip abduction   Hip adduction   Hip internal rotation   Hip external rotation   Knee flexion   Knee extension   Ankle dorsiflexion   Ankle plantarflexion   Ankle inversion   Ankle eversion   Great toe extension     (Blank rows = not tested)  FUNCTIONAL TESTS:  {Functional tests:24029}  GAIT: Distance walked: *** Assistive device utilized: {Assistive devices:23999} Level of assistance: {Levels of assistance:24026} Comments: ***  Outcome measure:                                                                                                                            TREATMENT DATE: ***   PATIENT EDUCATION:  Education details: *** Person educated: {Person educated:25204} Education method: {Education Method:25205} Education comprehension: {Education Comprehension:25206}  HOME EXERCISE PROGRAM: ***  ASSESSMENT:  CLINICAL IMPRESSION: Patient is a *** y.o. *** who was seen today for physical therapy evaluation and treatment for ***.   OBJECTIVE IMPAIRMENTS: {opptimpairments:25111}.   ACTIVITY LIMITATIONS: {activitylimitations:27494}  PARTICIPATION LIMITATIONS: {participationrestrictions:25113}  PERSONAL FACTORS: {Personal factors:25162} are also affecting patient's functional outcome.   REHAB POTENTIAL: {rehabpotential:25112}  CLINICAL DECISION MAKING: {clinical decision making:25114}  EVALUATION COMPLEXITY: {Evaluation complexity:25115}   GOALS: Goals reviewed with patient? {yes/no:20286}  SHORT TERM GOALS: Target date: ***  *** Baseline: Goal status: INITIAL  2.  *** Baseline:  Goal status: INITIAL  3.  *** Baseline:  Goal status: INITIAL  4.  *** Baseline:  Goal status: INITIAL  5.  *** Baseline:  Goal  status: INITIAL  6.  *** Baseline:  Goal status: INITIAL  LONG TERM GOALS: Target date: ***  *** Baseline:  Goal status: INITIAL  2.  *** Baseline:  Goal status: INITIAL  3.  *** Baseline:  Goal status: INITIAL  4.  *** Baseline:  Goal status: INITIAL  5.  *** Baseline:  Goal status: INITIAL  6.  *** Baseline:  Goal status: INITIAL  PLAN:  PT FREQUENCY: {rehab frequency:25116}  PT DURATION: {rehab duration:25117}  PLANNED INTERVENTIONS: {rehab planned interventions:25118::Patient/Family education,Balance training,Joint mobilization,Therapeutic exercises,Therapeutic activity,Neuromuscular re-education,Gait training,Self Care}  PLAN FOR NEXT SESSION: PIERRETTE Larue Saddie JONELLE, PT 12/27/2023, 4:38 PM

## 2023-12-27 NOTE — Anesthesia Postprocedure Evaluation (Signed)
 Anesthesia Post Note  Patient: Lindsay Hardy  Procedure(s) Performed: INSERTION, TUNNELED CENTRAL VENOUS DEVICE, WITH PORT (Left: Chest)     Patient location during evaluation: PACU Anesthesia Type: General Level of consciousness: awake and alert Pain management: pain level controlled Vital Signs Assessment: post-procedure vital signs reviewed and stable Respiratory status: spontaneous breathing, nonlabored ventilation, respiratory function stable and patient connected to nasal cannula oxygen  Cardiovascular status: blood pressure returned to baseline and stable Postop Assessment: no apparent nausea or vomiting Anesthetic complications: no   There were no known notable events for this encounter.  Last Vitals:  Vitals:   12/27/23 1245 12/27/23 1300  BP: 104/74 135/74  Pulse: 93 91  Resp: 15 15  Temp:  (!) 36.4 C  SpO2: 96% 97%    Last Pain:  Vitals:   12/27/23 1300  TempSrc:   PainSc: 5                  Rome Ade

## 2023-12-27 NOTE — Op Note (Signed)
 Date: 12/27/23  Patient: Lindsay Hardy MRN: [id number]  Preoperative Diagnosis: Pancreatic cancer Postoperative Diagnosis: Same  Procedure: Port-a-cath insertion  Surgeon: Leonor Dawn, MD  EBL: Minimal  Anesthesia: General endotracheal  Specimens: None  Indications: Ms. Rynders is a 73 yo female who was recently diagnosed with adenocarcinoma of the pancreatic head. She is to undergo neoadjuvant chemotherapy and presents today for port placement. After a discussion of the risks and benefits of surgery, she agreed to proceed.  Findings: 8-Fr single-lumen power port placed via the right subclavian vein under fluoroscopic guidance. Total catheter length of 17cm.  Procedure details: Informed consent was obtained in the preoperative area prior to the procedure. The patient was brought to the operating room and placed on the table in the supine position. General anesthesia was induced and appropriate lines and drains were placed for intraoperative monitoring. Perioperative antibiotics were administered per SCIP guidelines. The chest and neck were prepped and draped in the usual sterile fashion. A pre-procedure timeout was taken verifying patient identity, surgical site and procedure to be performed.  The patient was placed in Trendelenberg position and the right subclavian vein was accessed with a large-bore needle. A guidewire was inserted and advanced, and position in the SVC was confirmed fluoroscopically. The needle was removed and the wire was clipped to the drapes to secure its position. Next a small skin incision was made on the right upper chest wall, incorporating the wire exit site, and a subcutaneous pocket was created with cautery. The port and catheter were then flushed and brought onto the field. Three 2-0 prolene sutures were used to secure the port in the subcutaneous pocket, but the sutures were not tied down. The port was placed in the pocket and the attached cathether was measured  using fluoro - it was placed over the skin adjacent to the guidewire, and marked externally at the cavoatrial junction. The catheter was then cut at this location, which was at 17cm. The dilator and sheath were then advanced over the guidewire under fluoroscopic guidance, and the wire and dilator were removed. The end of the catheter was inserted through the sheath and advanced, and the sheath was peeled away. The port was then accessed with a Huber needle, and blood was aspirated and the port was flushed with heparinized saline. A final fluoroscopic image confirmed appropriate position of the catheter tip within the SVC, without kinking of the catheter. The prolene sutures were tied down. A final flush of 500 units heparin  (100 units/mL) was given via the port. The skin was closed with a deep dermal layer of interrupted 3-0 Vicryl suture, followed by a running subcuticular 4-0 monocryl suture. Dermabond was applied.  The patient tolerated the procedure well with no apparent complications. All counts were correct x2 at the end of the procedure. The patient was extubated and taken to PACU in stable condition.  Leonor Dawn, MD 12/27/23 12:04 PM

## 2023-12-27 NOTE — Transfer of Care (Signed)
 Immediate Anesthesia Transfer of Care Note  Patient: Lindsay Hardy  Procedure(s) Performed: INSERTION, TUNNELED CENTRAL VENOUS DEVICE, WITH PORT (Left: Chest)  Patient Location: PACU  Anesthesia Type:General  Level of Consciousness: awake and patient cooperative  Airway & Oxygen  Therapy: Patient Spontanous Breathing and Patient connected to face mask oxygen   Post-op Assessment: Report given to RN, Patient moving all extremities, and Patient moving all extremities X 4  Post vital signs: Reviewed  Last Vitals:  Vitals Value Taken Time  BP 130/80 12/27/23 12:09  Temp 36.4 C 12/27/23 12:09  Pulse 103 12/27/23 12:11  Resp 14 12/27/23 12:11  SpO2 99 % 12/27/23 12:11  Vitals shown include unfiled device data.  Last Pain:  Vitals:   12/27/23 0842  TempSrc: Oral         Complications: No notable events documented.

## 2023-12-27 NOTE — Interval H&P Note (Signed)
 History and Physical Interval Note:  12/27/2023 9:27 AM  Lindsay Hardy  has presented today for surgery, with the diagnosis of PANCREATIC CANCER.  The various methods of treatment have been discussed with the patient and family. After consideration of risks, benefits and other options for treatment, the patient has consented to  Procedure(s) with comments: INSERTION, TUNNELED CENTRAL VENOUS DEVICE, WITH PORT (N/A) - GEN PORT-A-CATH INSERTION WITH ULTRASOUND GUIDANCE as a surgical intervention.  The patient's history has been reviewed, patient examined, no change in status, stable for surgery.  I have reviewed the patient's chart and labs.  Questions were answered to the patient's satisfaction.     [name]

## 2023-12-28 ENCOUNTER — Ambulatory Visit: Admitting: Rehabilitation

## 2023-12-28 ENCOUNTER — Encounter (HOSPITAL_COMMUNITY): Payer: Self-pay | Admitting: Surgery

## 2023-12-28 ENCOUNTER — Other Ambulatory Visit: Payer: Self-pay | Admitting: *Deleted

## 2023-12-28 NOTE — Patient Instructions (Signed)
 Visit Information  Thank you for taking time to visit with me today. Please don't hesitate to contact me if I can be of assistance to you before our next scheduled telephone appointment.  Our next appointment is by telephone on Tuesday 01/04/24 at 1:00 pm  Please call the care guide team at [phone] if you need to cancel or reschedule your appointment.   Following are the goals we discussed today:  Patient Self Care Activities: (Reviewed and or Updated 12/22/23 call). Attend all scheduled provider appointments Call provider office for new concerns or questions  Participate in Transition of Care Program/Attend TOC scheduled calls Take medications as prescribed   Continue pacing activity as your recuperation from your recent hospital visit continues Monitor pain score and results after pain medication, encouraged to keep a log around pain management  Use assistive devices as needed to prevent falls- your cane  If you are experiencing a Mental Health or Behavioral Health Crisis or need someone to talk to, please  call the Suicide and Crisis Lifeline: 988 call the [place]  National Suicide Prevention Lifeline: (279)193-5338 or TTY: 716-374-4780 TTY 985-859-6652) to talk to a trained counselor call 1-800-273-TALK (toll free, 24 hour hotline) go to [facility] [address], [place] [phone]) call the [facility] Crisis Line: 754-337-3007 call 911   Patient verbalizes understanding of instructions and care plan provided today and agrees to view in MyChart. Active MyChart status and patient understanding of how to access instructions and care plan via MyChart confirmed with patient.     Pls call/ message for questions,  Stiven Kaspar Mckinney Azaleah Usman, RN, BSN, CCRN Alumnus RN Care Manager  Transitions of Care  [facility] - [facility] [phone]: direct office

## 2023-12-28 NOTE — Transitions of Care (Post Inpatient/ED Visit) (Signed)
 Transition of Care week 3/ day # 14  Visit Note  12/28/2023  Name: Lindsay Hardy MRN: [id number]          DOB: December 29, 1950  Situation: Patient enrolled in [facility] 30-day program. Visit completed with patient by telephone.   HIPAA identifiers x 2 verified  Background:  Hospitalized 07/01-05/ 2025: discharged home to self-care;  Reports essentially independent in most self care activities: has PCS aide 7 days a week- assists with bathing and dressing and whatever else I need 1 unplanned hospital admission x last (6) and (12) months   Initial Transition Care Management Follow-up Telephone Call    Past Medical History:  Diagnosis Date   Asthma    CAD (coronary artery disease) 11/07/2003    RCA 60% stenosis, LAD diffuse   Cancer (HCC)    Pancreatic   Chicken pox    CVA (cerebrovascular accident) (HCC) 06/09/1999    right frontoparietal cortical CVA - MRI September 2001   Diabetes mellitus    Diastolic dysfunction     ejection fraction 55-65% on 2-D echo May 2006   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 Avram   Granulomatous disease, chronic (HCC)     in right upper lobe per x-ray December 2005   H/O bilateral cataract extraction    Hyperlipidemia    Hypertension    NAFLD (nonalcoholic fatty liver disease)     based on ultrasound June 2002, elevated transaminase   OA (osteoarthritis)     DJP coracoclavicular ligament, left patella osteophytes June 2002, L4-S1 spondylosis, degenerative disc disease with bulge. I in October 2001, the C2-C4 spondylosis without stenosis and with spurs based on x-ray July 2005; diffuse idiopathic skeletal hyperostosis with bilateral hip lumbar degenerative changes   on  x-ray February 2005, bilateral plantar calcaneal spurs   Reactive airway disease     peak flow 180 in May 2005, chronic sinusitis with PND, bronchitis December 2002 and strep pneumonia April 2007,  FEC 66, MCV 60, ratio is 69, DLCO 57,  moderate restrictive and mild obstructive disease  on spirometry May 2005   Assessment:  I am doing fine- very well right now, no problems, feeling good; using the cane now just in case I need it.  Had the portacath put in yesterday- it went just fine and the site looks good, no redness- I go back on Thursday and they will re-check it then    Denies clinical concerns and sounds to be in no distress throughout [facility] 30-day program outreach call today  Patient Reported Symptoms: Cognitive Cognitive Status: Alert and oriented to person, place, and time, Normal speech and language skills, Insightful and able to interpret abstract concepts Cognitive/Intellectual Conditions Management [RPT]: None reported or documented in medical history or problem list      Neurological Neurological Review of Symptoms: No symptoms reported    HEENT HEENT Symptoms Reported: No symptoms reported      Cardiovascular Cardiovascular Symptoms Reported: No symptoms reported Does patient have uncontrolled Hypertension?: No Cardiovascular Management Strategies: Routine screening, Medication therapy, Coping strategies, Diet modification  Respiratory Respiratory Symptoms Reported: No symptoms reported Other Respiratory Symptoms: Denies shortness of breath/ cough and sounds to be in no respiratory distress throughout [facility]  call    Endocrine Endocrine Symptoms Reported: No symptoms reported Is patient diabetic?: Yes Is patient checking blood sugars at home?: Yes List most recent blood sugar readings, include date and time of day: Reports general fasting blood sugars at home consistently under 150; states usually around 130 or 140 Reports post-prandial value this morning of 240 confirmed  continues using insulin  at 20 U QD as prescribed    Gastrointestinal Gastrointestinal Symptoms Reported: No symptoms reported Other Gastrointestinal Symptoms: Reports good appetite; confirms having normal and regular BM's; states had a good BM this morning; reports ongoing good pain control using prescribed medications Gastrointestinal Management Strategies: Coping strategies, Medication therapy    Genitourinary Genitourinary Symptoms Reported: No symptoms reported Additional Genitourinary Details: Reports peeing fine, normally Genitourinary Management Strategies: Coping strategies  Integumentary Integumentary Symptoms Reported: Other Other Integumentary Symptoms: Confirmed attended outpatient yesterday for porta-cath placement; states it didn't take long, I was in and out and it went fine; denies pain at site; reports the site looks fine, no redness, and no problems; they will look at it again on Thursday when I go for my next treatment Skin Management Strategies: Routine screening  Musculoskeletal Musculoskelatal Symptoms Reviewed: Unsteady gait Additional Musculoskeletal Details: confirmed now using assistive devices on regular basis, at baseline -- cane just in case I need it Musculoskeletal Management Strategies: Coping strategies, Routine screening, Medical device      Psychosocial Psychosocial Symptoms Reported: No symptoms reported Other Psychosocial Conditions: Reported feeling good about everything; I am in with the nurse navigator and know to call her if I have any problems or concerns Behavioral Management Strategies: Coping strategies       There were no vitals filed for this visit.  Medications Reviewed Today     Reviewed by Orville Mena M, RN (Registered Nurse) on 12/28/23 at 1300  Med List Status: <None>   Medication Order Taking? Sig Documenting Provider Last Dose Status Informant  albuterol  (VENTOLIN  HFA) 108 (90 Base) MCG/ACT inhaler [id number] No  INHALE 2 PUFFS INTO THE LUNGS EVERY 4 (FOUR) HOURS AS NEEDED FOR SHORTNESS OF BREATH. Geofm Glade PARAS, MD 12/27/2023  7:00 AM Active Self, Pharmacy Records  amLODipine  (NORVASC ) 10 MG tablet [id number] No Take 1 tablet (10 mg total) by mouth daily. Geofm Glade PARAS, MD 12/27/2023  7:00 AM Active Self, Pharmacy Records  cyanocobalamin  (VITAMIN B12) 500 MCG tablet [id number] No Take 500 mcg by mouth daily. [provider] Past Week Active Self, Pharmacy Records  fluticasone  (FLONASE ) 50 MCG/ACT nasal spray [id number] No Place 2 sprays into both nostrils daily. Geofm Glade PARAS, MD 12/27/2023  7:00 AM Active Self, Pharmacy Records  glipiZIDE  (GLUCOTROL ) 10 MG tablet [id number] No TAKE 2 TABLETS BY MOUTH 2 TIMES DAILY BEFORE A MEAL. Geofm Glade PARAS, MD 12/26/2023 Active Self, Pharmacy Records  hydrochlorothiazide  (HYDRODIURIL ) 25 MG tablet [id number] No Take 1 tablet (25 mg total) by mouth daily. Geofm Glade PARAS, MD Past Month Active Self, Pharmacy Records  HYDROcodone -acetaminophen  [facility]) 10-325 MG tablet [id number] No Take 1-2 tablets by mouth every 4 (four) hours as needed for moderate pain (pain score 4-6) or severe pain (pain score 7-10). Patsy Lenis, MD 12/27/2023  7:00 AM Active   ibuprofen  (ADVIL ) 800 MG tablet [id number] No TAKE 1 TABLET BY MOUTH TWICE DAILY AS NEEDED FOR MODERATE PAIN Burns, Glade PARAS, MD More than a month Active Self, Pharmacy Records  insulin  glargine (LANTUS ) 100 UNIT/ML Solostar Pen [id number] No Inject 20 Units into the skin daily. Geofm Glade PARAS, MD  12/27/2023  7:00 AM Active Self, Pharmacy Records  lidocaine -prilocaine  (EMLA ) cream [id number]  Apply 1 Application topically as needed (Apply to Port 1-2 hours prior to its use start using with 2nd treatment). Cloretta Arley NOVAK, MD  Active   linaclotide  (LINZESS ) 145 MCG CAPS capsule [id number]  TAKE 1 CAPSULE BY MOUTH DAILY BEFORE BREAKFAST.  Patient not taking: Reported on 12/22/2023   Geofm Glade PARAS, MD  Active Self, Pharmacy Records   metFORMIN  (GLUCOPHAGE -XR) 500 MG 24 hr tablet [id number] No TAKE 2 TABLETS BY MOUTH TWICE DAILY BEFORE A MEAL Geofm Glade PARAS, MD 12/26/2023 Active Self, Pharmacy Records  montelukast  (SINGULAIR ) 10 MG tablet [id number] No TAKE 1 TABLET BY MOUTH EVRY DAY AT BEDTIME Geofm Glade PARAS, MD Past Week Active Self, Pharmacy Records  naloxone  (NARCAN ) nasal spray 4 mg/0.1 mL [id number]  1 spray in one nostril x1; may repeat dose in alternate nostrils q2-3min prn  Patient taking differently: 1 spray in one nostril x1; may repeat dose in alternate nostrils q2-3min prn   Geofm Glade PARAS, MD  Active Self, Pharmacy Records  ondansetron  (ZOFRAN ) 8 MG tablet [id number]  Take 1 tablet (8 mg total) by mouth every 8 (eight) hours as needed. Cloretta Arley NOVAK, MD  Active   potassium chloride  (MICRO-K ) 10 MEQ CR capsule [id number]  TAKE 1 CAPSULE BY MOUTH TWICE A DAY  Patient taking differently: TAKE 1 CAPSULE BY MOUTH TWICE A DAY   Burns, Glade PARAS, MD  Active Self, Pharmacy Records  prochlorperazine  (COMPAZINE ) 10 MG tablet [id number]  Take 1 tablet (10 mg total) by mouth every 6 (six) hours as needed for nausea or vomiting. Cloretta Arley NOVAK, MD  Active            Recommendation:   PCP Follow-up: as scheduled 01/17/24: patient continues to decline/ refuse scheduling for hospital follow up office visit with PCP Specialty provider follow-up- oncology provider: as scheduled 12/30/23 and 01/13/24 Continue Current Plan of Care  Follow Up Plan:   Telephone follow-up in 1 week- as scheduled 01/04/24  Plan for next week's call: Review medications for any changes post specialist follow up visit on 12/30/23 Review continued medication adherence Review oncology provider office visit/labs/ any medication or plan of care changes since hospital discharge Assess ongoing porta-cath site/ management: inserted as outpatient on 12/27/23 Review daily blood sugars since last week.  Review pain control/scores, and medication efficacy.   Pls  call/ message for questions,  Marvene Strohm Mckinney Shaunae Sieloff, RN, BSN, CCRN Alumnus RN Care Manager  Transitions of Care  [facility] - [facility] [phone]: direct office

## 2023-12-30 ENCOUNTER — Inpatient Hospital Stay (HOSPITAL_BASED_OUTPATIENT_CLINIC_OR_DEPARTMENT_OTHER): Admitting: Oncology

## 2023-12-30 ENCOUNTER — Inpatient Hospital Stay

## 2023-12-30 VITALS — BP 114/71 | HR 108 | Temp 97.9°F | Resp 18 | Ht 65.0 in | Wt 233.1 lb

## 2023-12-30 VITALS — BP 127/67 | HR 97 | Temp 98.3°F | Resp 16

## 2023-12-30 DIAGNOSIS — I10 Essential (primary) hypertension: Secondary | ICD-10-CM | POA: Diagnosis not present

## 2023-12-30 DIAGNOSIS — Z5111 Encounter for antineoplastic chemotherapy: Secondary | ICD-10-CM | POA: Diagnosis not present

## 2023-12-30 DIAGNOSIS — C250 Malignant neoplasm of head of pancreas: Secondary | ICD-10-CM

## 2023-12-30 DIAGNOSIS — K219 Gastro-esophageal reflux disease without esophagitis: Secondary | ICD-10-CM | POA: Diagnosis not present

## 2023-12-30 DIAGNOSIS — C259 Malignant neoplasm of pancreas, unspecified: Secondary | ICD-10-CM

## 2023-12-30 DIAGNOSIS — Z8673 Personal history of transient ischemic attack (TIA), and cerebral infarction without residual deficits: Secondary | ICD-10-CM | POA: Diagnosis not present

## 2023-12-30 DIAGNOSIS — E119 Type 2 diabetes mellitus without complications: Secondary | ICD-10-CM | POA: Diagnosis not present

## 2023-12-30 LAB — CMP (CANCER CENTER ONLY)
ALT: 73 U/L — ABNORMAL HIGH (ref 0–44)
AST: 79 U/L — ABNORMAL HIGH (ref 15–41)
Albumin: 3.1 g/dL — ABNORMAL LOW (ref 3.5–5.0)
Alkaline Phosphatase: 411 U/L — ABNORMAL HIGH (ref 38–126)
Anion gap: 10 (ref 5–15)
BUN: <5 mg/dL — ABNORMAL LOW (ref 8–23)
CO2: 25 mmol/L (ref 22–32)
Calcium: 8.6 mg/dL — ABNORMAL LOW (ref 8.9–10.3)
Chloride: 100 mmol/L (ref 98–111)
Creatinine: 0.61 mg/dL (ref 0.44–1.00)
GFR, Estimated: >60 mL/min (ref >60)
Glucose, Bld: 200 mg/dL — ABNORMAL HIGH (ref 70–99)
Potassium: 3.6 mmol/L (ref 3.5–5.1)
Sodium: 136 mmol/L (ref 135–145)
Total Bilirubin: 1.5 mg/dL — ABNORMAL HIGH (ref 0.0–1.2)
Total Protein: 6.7 g/dL (ref 6.5–8.1)

## 2023-12-30 MED ORDER — PROCHLORPERAZINE MALEATE 10 MG PO TABS
10.0000 mg | ORAL_TABLET | Freq: Once | ORAL | Status: AC
  Administered 2023-12-30: 10 mg via ORAL
  Filled 2023-12-30: qty 1

## 2023-12-30 MED ORDER — SODIUM CHLORIDE 0.9 % IV SOLN
2200.0000 mg | Freq: Once | INTRAVENOUS | Status: AC
  Administered 2023-12-30: 2200 mg via INTRAVENOUS
  Filled 2023-12-30: qty 52.57

## 2023-12-30 MED ORDER — HEPARIN SOD (PORK) LOCK FLUSH 100 UNIT/ML IV SOLN
500.0000 [IU] | Freq: Once | INTRAVENOUS | Status: AC | PRN
Start: 2023-12-30 — End: 2023-12-30
  Administered 2023-12-30: 500 [IU]

## 2023-12-30 MED ORDER — PACLITAXEL PROTEIN-BOUND CHEMO INJECTION 100 MG
100.0000 mg/m2 | Freq: Once | INTRAVENOUS | Status: AC
  Administered 2023-12-30: 200 mg via INTRAVENOUS
  Filled 2023-12-30: qty 40

## 2023-12-30 MED ORDER — SODIUM CHLORIDE 0.9 % IV SOLN: INTRAVENOUS | Status: DC

## 2023-12-30 MED ORDER — SODIUM CHLORIDE 0.9% FLUSH
10.0000 mL | INTRAVENOUS | Status: DC | PRN
  Administered 2023-12-30: 10 mL

## 2023-12-30 NOTE — Patient Instructions (Addendum)
 [facility] - A DEPT OF [facility]  Discharge Instructions: Thank you for choosing [facility] to provide your oncology and hematology care.   If you have a lab appointment with the Cancer Center, please go directly to the Cancer Center and check in at the registration area.   Wear comfortable clothing and clothing appropriate for easy access to any Portacath or PICC line.   We strive to give you quality time with your provider. You may need to reschedule your appointment if you arrive late (15 or more minutes).  Arriving late affects you and other patients whose appointments are after yours.  Also, if you miss three or more appointments without notifying the office, you may be dismissed from the clinic at the provider's discretion.      For prescription refill requests, have your pharmacy contact our office and allow 72 hours for refills to be completed.    Today you received the following chemotherapy and/or immunotherapy agents: Abraxane  and Gemcitabine  (Gemzar )      To help prevent nausea and vomiting after your treatment, we encourage you to take your nausea medication as directed.  BELOW ARE SYMPTOMS THAT SHOULD BE REPORTED IMMEDIATELY: *FEVER GREATER THAN 100.4 F (38 C) OR HIGHER *CHILLS OR SWEATING *NAUSEA AND VOMITING THAT IS NOT CONTROLLED WITH YOUR NAUSEA MEDICATION *UNUSUAL SHORTNESS OF BREATH *UNUSUAL BRUISING OR BLEEDING *URINARY PROBLEMS (pain or burning when urinating, or frequent urination) *BOWEL PROBLEMS (unusual diarrhea, constipation, pain near the anus) TENDERNESS IN MOUTH AND THROAT WITH OR WITHOUT PRESENCE OF ULCERS (sore throat, sores in mouth, or a toothache) UNUSUAL RASH, SWELLING OR PAIN  UNUSUAL VAGINAL DISCHARGE OR ITCHING   Items with * indicate a potential emergency and should be followed up as soon as possible or go to the Emergency Department if any problems should occur.  Please show the CHEMOTHERAPY  ALERT CARD or IMMUNOTHERAPY ALERT CARD at check-in to the Emergency Department and triage nurse.  Should you have questions after your visit or need to cancel or reschedule your appointment, please contact [facility] - A DEPT OF [facility]  Dept: [phone]  and follow the prompts.  Office hours are 8:00 a.m. to 4:30 p.m. Monday - Friday. Please note that voicemails left after 4:00 p.m. may not be returned until the following business day.  We are closed weekends and major holidays. You have access to a nurse at all times for urgent questions. Please call the main number to the clinic Dept: [phone] and follow the prompts.   For any non-urgent questions, you may also contact your provider using MyChart. We now offer e-Visits for anyone 16 and older to request care online for non-urgent symptoms. For details visit [email or web address].   Also download the MyChart app! Go to the app store, search MyChart, open the app, select [facility], and log in with your MyChart username and password.  Paclitaxel  Nanoparticle Albumin-Bound Injection What is this medication? NANOPARTICLE ALBUMIN-BOUND PACLITAXEL  (Na no PAHR ti kuhl al BYOO muhn-bound PAK li TAX el) treats some types of cancer. It works by slowing down the growth of cancer cells. This medicine may be used for other purposes; ask your health care provider or pharmacist if you have questions. COMMON BRAND NAME(S): Abraxane  What should I tell my care team before I take this medication? They need to know if you have any of these conditions: Liver disease Low white blood cell levels An unusual  or allergic reaction to paclitaxel , albumin, other medications, foods, dyes, or preservatives If you or your partner are pregnant or trying to get pregnant Breast-feeding How should I use this medication? This medication is injected into a vein. It is given by your care team in a hospital or clinic setting. Talk  to your care team about the use of this medication in children. Special care may be needed. Overdosage: If you think you have taken too much of this medicine contact a poison control center or emergency room at once. NOTE: This medicine is only for you. Do not share this medicine with others. What if I miss a dose? Keep appointments for follow-up doses. It is important not to miss your dose. Call your care team if you are unable to keep an appointment. What may interact with this medication? Other medications may affect the way this medication works. Talk with your care team about all of the medications you take. They may suggest changes to your treatment plan to lower the risk of side effects and to make sure your medications work as intended. This list may not describe all possible interactions. Give your health care provider a list of all the medicines, herbs, non-prescription drugs, or dietary supplements you use. Also tell them if you smoke, drink alcohol , or use illegal drugs. Some items may interact with your medicine. What should I watch for while using this medication? Your condition will be monitored carefully while you are receiving this medication. You may need blood work while taking this medication. This medication may make you feel generally unwell. This is not uncommon as chemotherapy can affect healthy cells as well as cancer cells. Report any side effects. Continue your course of treatment even though you feel ill unless your care team tells you to stop. This medication can cause serious allergic reactions. To reduce the risk, your care team may give you other medications to take before receiving this one. Be sure to follow the directions from your care team. This medication may increase your risk of getting an infection. Call your care team for advice if you get a fever, chills, sore throat, or other symptoms of a cold or flu. Do not treat yourself. Try to avoid being around people who  are sick. This medication may increase your risk to bruise or bleed. Call your care team if you notice any unusual bleeding. Be careful brushing or flossing your teeth or using a toothpick because you may get an infection or bleed more easily. If you have any dental work done, tell your dentist you are receiving this medication. Talk to your care team if you or your partner may be pregnant. Serious birth defects can occur if you take this medication during pregnancy and for 6 months after the last dose. You will need a negative pregnancy test before starting this medication. Contraception is recommended while taking this medication and for 6 months after the last dose. Your care team can help you find the option that works for you. If your partner can get pregnant, use a condom during sex while taking this medication and for 3 months after the last dose. Do not breastfeed while taking this medication and for 2 weeks after the last dose. This medication may cause infertility. Talk to your care team if you are concerned about your fertility. What side effects may I notice from receiving this medication? Side effects that you should report to your care team as soon as possible: Allergic reactions--skin rash,  itching, hives, swelling of the face, lips, tongue, or throat Dry cough, shortness of breath or trouble breathing Infection--fever, chills, cough, sore throat, wounds that don't heal, pain or trouble when passing urine, general feeling of discomfort or being unwell Low red blood cell level--unusual weakness or fatigue, dizziness, headache, trouble breathing Pain, tingling, or numbness in the hands or feet Stomach pain, unusual weakness or fatigue, nausea, vomiting, diarrhea, or fever that lasts longer than expected Unusual bruising or bleeding Side effects that usually do not require medical attention (report to your care team if they continue or are bothersome): Diarrhea Fatigue Hair loss Loss  of appetite Nausea Vomiting This list may not describe all possible side effects. Call your doctor for medical advice about side effects. You may report side effects to FDA at 1-800-FDA-1088. Where should I keep my medication? This medication is given in a hospital or clinic. It will not be stored at home. NOTE: This sheet is a summary. It may not cover all possible information. If you have questions about this medicine, talk to your doctor, pharmacist, or health care provider.  2024 Elsevier/Gold Standard (2021-10-09 00:00:00)  Gemcitabine  Injection What is this medication? GEMCITABINE  (jem SYE ta been) treats some types of cancer. It works by slowing down the growth of cancer cells. This medicine may be used for other purposes; ask your health care provider or pharmacist if you have questions. COMMON BRAND NAME(S): Gemzar , Infugem  What should I tell my care team before I take this medication? They need to know if you have any of these conditions: Blood disorders Infection Kidney disease Liver disease Lung or breathing disease, such as asthma or COPD Recent or ongoing radiation therapy An unusual or allergic reaction to gemcitabine , other medications, foods, dyes, or preservatives If you or your partner are pregnant or trying to get pregnant Breast-feeding How should I use this medication? This medication is injected into a vein. It is given by your care team in a hospital or clinic setting. Talk to your care team about the use of this medication in children. Special care may be needed. Overdosage: If you think you have taken too much of this medicine contact a poison control center or emergency room at once. NOTE: This medicine is only for you. Do not share this medicine with others. What if I miss a dose? Keep appointments for follow-up doses. It is important not to miss your dose. Call your care team if you are unable to keep an appointment. What may interact with this  medication? Interactions have not been studied. This list may not describe all possible interactions. Give your health care provider a list of all the medicines, herbs, non-prescription drugs, or dietary supplements you use. Also tell them if you smoke, drink alcohol , or use illegal drugs. Some items may interact with your medicine. What should I watch for while using this medication? Your condition will be monitored carefully while you are receiving this medication. This medication may make you feel generally unwell. This is not uncommon, as chemotherapy can affect healthy cells as well as cancer cells. Report any side effects. Continue your course of treatment even though you feel ill unless your care team tells you to stop. In some cases, you may be given additional medications to help with side effects. Follow all directions for their use. This medication may increase your risk of getting an infection. Call your care team for advice if you get a fever, chills, sore throat, or other symptoms of a  cold or flu. Do not treat yourself. Try to avoid being around people who are sick. This medication may increase your risk to bruise or bleed. Call your care team if you notice any unusual bleeding. Be careful brushing or flossing your teeth or using a toothpick because you may get an infection or bleed more easily. If you have any dental work done, tell your dentist you are receiving this medication. Avoid taking medications that contain aspirin , acetaminophen , ibuprofen , naproxen , or ketoprofen unless instructed by your care team. These medications may hide a fever. Talk to your care team if you or your partner wish to become pregnant or think you might be pregnant. This medication can cause serious birth defects if taken during pregnancy and for 6 months after the last dose. A negative pregnancy test is required before starting this medication. A reliable form of contraception is recommended while taking  this medication and for 6 months after the last dose. Talk to your care team about effective forms of contraception. Do not father a child while taking this medication and for 3 months after the last dose. Use a condom while having sex during this time period. Do not breastfeed while taking this medication and for at least 1 week after the last dose. This medication may cause infertility. Talk to your care team if you are concerned about your fertility. What side effects may I notice from receiving this medication? Side effects that you should report to your care team as soon as possible: Allergic reactions--skin rash, itching, hives, swelling of the face, lips, tongue, or throat Capillary leak syndrome--stomach or muscle pain, unusual weakness or fatigue, feeling faint or lightheaded, decrease in the amount of urine, swelling of the ankles, hands, or feet, trouble breathing Infection--fever, chills, cough, sore throat, wounds that don't heal, pain or trouble when passing urine, general feeling of discomfort or being unwell Liver injury--right upper belly pain, loss of appetite, nausea, light-colored stool, dark yellow or brown urine, yellowing skin or eyes, unusual weakness or fatigue Low red blood cell level--unusual weakness or fatigue, dizziness, headache, trouble breathing Lung injury--shortness of breath or trouble breathing, cough, spitting up blood, chest pain, fever Stomach pain, bloody diarrhea, pale skin, unusual weakness or fatigue, decrease in the amount of urine, which may be signs of hemolytic uremic syndrome Sudden and severe headache, confusion, change in vision, seizures, which may be signs of posterior reversible encephalopathy syndrome (PRES) Unusual bruising or bleeding Side effects that usually do not require medical attention (report to your care team if they continue or are bothersome): Diarrhea Drowsiness Hair loss Nausea Pain, redness, or swelling with sores inside the  mouth or throat Vomiting This list may not describe all possible side effects. Call your doctor for medical advice about side effects. You may report side effects to FDA at 1-800-FDA-1088. Where should I keep my medication? This medication is given in a hospital or clinic. It will not be stored at home. NOTE: This sheet is a summary. It may not cover all possible information. If you have questions about this medicine, talk to your doctor, pharmacist, or health care provider.  2024 Elsevier/Gold Standard (2021-09-30 00:00:00)

## 2023-12-30 NOTE — Progress Notes (Signed)
 [facility] OFFICE PROGRESS NOTE   Diagnosis: Pancreas cancer  INTERVAL HISTORY:   [name] returns as scheduled.  She underwent Port-A-Cath placement 12/27/2023.  She saw Dr. Dasie 12/23/2023.  Dr. Dasie recommends neoadjuvant chemotherapy.  [name] is scheduled to begin gemcitabine /Abraxane  today.  She has attended a chemotherapy teaching class.  No jaundice or nausea.  She has chronic back pain.  She takes hydrocodone  every 4 hours for relief of pain.  She takes up to 6 hydrocodone  tablets per day.  Objective:  Vital signs in last 24 hours:  Blood pressure 114/71, pulse (!) 110, temperature 97.9 F (36.6 C), temperature source Temporal, resp. rate 18, height 5' 5 (1.651 m), weight 233 lb 1.6 oz (105.7 kg), SpO2 100%.    HEENT: Muddy sclera, no thrush Resp: Lungs clear bilaterally Cardio: Regular rate and rhythm GI: No hepatosplenomegaly Vascular: No leg edema  Portacath/PICC-without erythema  Lab Results:  Lab Results  Component Value Date   WBC 7.3 12/24/2023   HGB 10.8 (L) 12/24/2023   HCT 33.9 (L) 12/24/2023   MCV 78.8 (L) 12/24/2023   PLT 332 12/24/2023   NEUTROABS 5.3 12/24/2023    CMP  Lab Results  Component Value Date   NA 136 12/24/2023   K 3.9 12/24/2023   CL 100 12/24/2023   CO2 23 12/24/2023   GLUCOSE 189 (H) 12/24/2023   BUN 6 (L) 12/24/2023   CREATININE 0.68 12/24/2023   CALCIUM  8.8 (L) 12/24/2023   PROT 6.9 12/24/2023   ALBUMIN 3.3 (L) 12/24/2023   AST 52 (H) 12/24/2023   ALT 86 (H) 12/24/2023   ALKPHOS 618 (H) 12/24/2023   BILITOT 2.0 (H) 12/24/2023   GFRNONAA >60 12/24/2023   GFRAA >60 11/23/2019    Lab Results  Component Value Date   CAN199 <2 12/09/2023    Lab Results  Component Value Date   INR 1.0 12/07/2023   LABPROT 14.2 12/07/2023    Imaging:  DG CHEST PORT 1 VIEW Result Date: 12/27/2023 CLINICAL DATA:  Port-A-Cath placement. EXAM: PORTABLE CHEST 1 VIEW COMPARISON:  11/23/2019 and CT chest  12/09/2023. FINDINGS: Trachea is midline. Heart size normal. Right sided Port-A-Cath tip is in the SVC. No pneumothorax. Lungs are low in volume. Pleuroparenchymal scarring in the apical right upper lobe, better seen on CT 12/09/2023. Calcified granulomas. Mildly elevated right hemidiaphragm. No pleural fluid. IMPRESSION: Right Port-A-Cath placement.  No pneumothorax.  No acute findings. Electronically Signed   By: Newell Eke M.D.   On: 12/27/2023 12:53   DG C-Arm 1-60 Min-No Report Result Date: 12/27/2023 Fluoroscopy was utilized by the requesting physician.  No radiographic interpretation.    Medications: I have reviewed the patient's current medications.   Assessment/Plan: Pancreas cancer, stage Ib (cT2c N0) 12/02/2023 CT abdomen/pelvis: Ill-defined hypoattenuating pancreas head/uncinate lesion with pancreatic biliary duct dilation, unchanged adjacent cystic focus in the pancreas uncinate 8, incompletely characterized 1.5 cm hypodense left lower pole and 1.5 cm hypoattenuating focus in the lower pole left kidney 12/07/2023 CT Abdo/pelvis: Dilation of the intra and extrahepatic biliary tree, ill-defined low attenuating area in the region of the uncinate of the pancreas with a small adjacent cyst, no adenopathy 12/07/2023: MRI abdomen/MRCP: Severe intra and exophytic biliary duct dilation, 3.4 x 2.3 cm mass in the inferior pancreas head and uncinate 8 with effacement of the pancreatic and common ducts, cystic change in the adjacent uncinate 8, mass abuts the central dural SMV with less than 50% contact, fat planes preserved to the SMA,  no enlarged lymph nodes, kidneys are normal  12/08/2023 ERCP bile duct brushing-adenocarcinoma 12/09/2023: CT chest-centrilobular emphysema, no evidence of metastatic disease Cycle 1 gemcitabine /Abraxane  12/30/2023 Struct of jaundice secondary to 1, status post placement of a covered common duct stent 12/08/2023 Diabetes CVA in 2001 Empty sella  syndrome Hypertension GERD Chronic back pain Indeterminate left renal lesions on CT abdomen/pelvis 12/01/2023, not mentioned on CT/pelvis 10/28/2023 or MRI abdomen 12/07/2023     Disposition: Lindsay Hardy has been diagnosed with locally advanced pancreas cancer.  She will begin neoadjuvant gemcitabine /Abraxane  today.  She will return for an office visit and chemotherapy in 2 weeks.  She reports being out of pain medication.  We will work on getting the hydrocodone  refilled.   Arley Hof, MD  12/30/2023  9:39 AM

## 2023-12-30 NOTE — Progress Notes (Signed)
 Patient seen by Dr. Arley Hof today  Vitals are within treatment parameters:Yes OK to proceed with pulse 108  Labs are within treatment parameters: Yes OK to use CBC from 7/18 to treat today  Treatment plan has been signed: Yes   Per physician team, Patient is ready for treatment and there are NO modifications to the treatment plan.

## 2023-12-30 NOTE — Progress Notes (Signed)
 Provided patient samples for Glucerna and placed nutritional referral.

## 2023-12-31 ENCOUNTER — Telehealth: Payer: Self-pay | Admitting: *Deleted

## 2023-12-31 NOTE — Telephone Encounter (Signed)
 LVM with [facility] to report patient is having difficulty getting her pain medication refill due to being too soon. Requested they look into this to see if she is able/allowed to refill now. Was sent to [facility] Transitions [facility] Care on 12/13/23. Requested they reach out to patient as well.

## 2024-01-03 ENCOUNTER — Encounter: Payer: Self-pay | Admitting: Nutrition

## 2024-01-03 ENCOUNTER — Inpatient Hospital Stay: Admitting: Nutrition

## 2024-01-03 NOTE — Progress Notes (Deleted)
 73 yo female diagnosed with Pancreas cancer and followed by Dr. Cloretta. She receives Abraxane  and Gemcitabine .  PMH includes Asthma, CAD, CVA, DM, GERD, HLD, HTN, NAFLD.  Medications include Vitamin B12, Glucotrol , Lantus , Glucophage  XR, Zofran , and Compazine .  Labs include Glucose 200, BUN <5 and Albumin 3.1.  Height: 5'5. Weight: 233 pounds 1.6 oz July 24      BMI: 38.79. 253 pounds June 12 253 pounds May 21 262 pounds February 10  Telephone call     Nutrition Diagnosis: Unintended weight loss related to cancer and associated treatments as evidenced by 11% loss in less than 6 months;clinically significant  Intervention:   Monitoring, Evaluation, Goals:  Next Visit:

## 2024-01-03 NOTE — Progress Notes (Signed)
 Patient did not answer telephone for scheduled nutrition appointment.

## 2024-01-04 ENCOUNTER — Other Ambulatory Visit: Payer: Self-pay | Admitting: *Deleted

## 2024-01-04 NOTE — Patient Instructions (Signed)
 Visit Information  Thank you for taking time to visit with me today. Please don't hesitate to contact me if I can be of assistance to you before our next scheduled telephone appointment.  Our next appointment is by telephone on Friday January 14, 2024 at 1:00 pm  Please call the care guide team at [phone] if you need to cancel or reschedule your appointment.   Following are the goals we discussed today:  Patient Self Care Activities: (Reviewed and or Updated 12/22/23 call). Attend all scheduled provider appointments Call provider office for new concerns or questions  Participate in Transition of Care Program/Attend TOC scheduled calls Take medications as prescribed   Continue pacing activity as your recuperation from your recent hospital visit continues Monitor pain score and results after pain medication, encouraged to keep a log around pain management Continue working with your pain management clinic to make sure your pain medication is optimized Continue using assistive devices as needed to prevent falls- your cane  If you are experiencing a Mental Health or Behavioral Health Crisis or need someone to talk to, please  call the Suicide and Crisis Lifeline: 988 call the [place]  National Suicide Prevention Lifeline: 212-490-6840 or TTY: 808-218-2807 TTY 774-602-8777) to talk to a trained counselor call 1-800-273-TALK (toll free, 24 hour hotline) go to [facility] [address] [place] [phone]) call the [facility] Crisis Line: [phone] call 911   Patient verbalizes understanding of instructions and care plan provided today and agrees to view in MyChart. Active MyChart status and patient understanding of how to access instructions and care plan via MyChart confirmed with patient.      Derra Shartzer Mckinney Kora Groom, RN, BSN, Media planner  Transitions of Care  [facility] [phone]:  direct office

## 2024-01-04 NOTE — Transitions of Care (Post Inpatient/ED Visit) (Signed)
 Transition of Care week 4/ day # 21  Visit Note  01/04/2024  Name: Lindsay Hardy MRN: [id number]          DOB: [birth year]  Situation: Patient enrolled in [facility] 30-day program. Visit completed with patient by telephone.   Background:  Hospitalized 07/01-05/ 2025: discharged home to self-care;  Reports essentially independent in most self care activities: has PCS aide 7 days a week- assists with bathing and dressing and whatever else I need 1 unplanned hospital admission x last (6) and (12) months Established with [facility]: [phone]  Initial Transition Care Management Follow-up Telephone Call    Past Medical History:  Diagnosis Date   Asthma    CAD (coronary artery disease) 11/07/2003    RCA 60% stenosis, LAD diffuse   Cancer (HCC)    Pancreatic   Chicken pox    CVA (cerebrovascular accident) (HCC) 06/09/1999    right frontoparietal cortical CVA - MRI September 2001   Diabetes mellitus    Diastolic dysfunction     ejection fraction 55-65% on 2-D echo May 2006   Empty sella syndrome [facility])     based on MRI September 2001, related to obesity, hypertension, it was determined that patient has not required treatment but will be monitored with TSH, ACTH, cortisol, testosterone, prolactin, growth hormone   GERD (gastroesophageal reflux disease)     EGD June 2004 -  followed by Dr. Avram   Granulomatous disease, chronic (HCC)     in right upper lobe per x-ray December 2005   H/O bilateral cataract extraction    Hyperlipidemia    Hypertension    NAFLD (nonalcoholic fatty liver disease)     based on ultrasound June 2002, elevated transaminase   OA (osteoarthritis)     DJP coracoclavicular ligament, left patella osteophytes June 2002, L4-S1 spondylosis, degenerative disc disease with bulge. I in October 2001, the C2-C4 spondylosis without stenosis and with spurs based on x-ray July 2005; diffuse idiopathic skeletal hyperostosis with bilateral hip lumbar  degenerative changes   on x-ray February 2005, bilateral plantar calcaneal spurs   Reactive airway disease     peak flow 180 in May 2005, chronic sinusitis with PND, bronchitis December 2002 and strep pneumonia April 2007,  FEC 66, MCV 60, ratio is 69, DLCO 57,  moderate restrictive and mild obstructive disease  on spirometry May 2005   Assessment:  I am not doing good and don't want to talk very long.  I am in pain and have been calling my pain management doctor every day for a week.  They tell me they can't order more pain medicine for me unless I switch to a different stronger medicine.  I am out of my pain medication now for several days.  The pharmacy at [facility] tells me they can accept a new prescription and fill it tomorrow, but I think the pain management doctor has cancelled my prescription.  Other than that I am fine, doing everything I am supposed to and the appointment last week at the cancer center went fine  Other than issues around pain management: patient denies clinical concerns and sounds to be in no distress throughout [facility] 30-day program outreach call today Care Coordination Outreach: 1:20 pm: contacted [facility]/ left voice message; call was returned promptly by [place]; Albertha explained that patient's outpatient pharmacy requested that the existing prescription be cancelled, as patient has been calling the outpatient pharmacy constantly requesting additional medication: states that patient has been offered a stronger  pain medication (oxycodone ) but is refusing: wants to continue using hydrocodone : [place] confirms they are limited as per regulation as to quantity they are allowed to prescribe and that patient is taking more than allowed per current regulation which is why she ran out of the medication.  Albertha confirms that they will call in a new prescription tomorrow, when it is ready for additional refill- however, she is concerned that patient will again run out in  the future, given she is refusing stronger medication; Albertha adds that they have been communicating daily with patient regarding this plan Confirmed patient's next appointment with pain management provider is scheduled for 01/17/24 at 2:00 pm 1:36 pm: contacted patient and reinforced information provided by pain management provider- confirmed they reported that a new prescription will be called in tomorrow as planned, which is earliest date the medication can be re-prescribed- to patient's outpatient pharmacy: encouraged patient to pick up medication tomorrow; encouraged patient to discuss ongoing pain management with both oncology provider and with pain management provider; encouraged her to consider other pain medication options as offered by pain management clinic- she verbalizes agreement with this plan  Patient Reported Symptoms: Cognitive Cognitive Status: Alert and oriented to person, place, and time, Normal speech and language skills, Insightful and able to interpret abstract concepts Cognitive/Intellectual Conditions Management [RPT]: None reported or documented in medical history or problem list      Neurological Neurological Review of Symptoms: No symptoms reported    HEENT HEENT Symptoms Reported: No symptoms reported      Cardiovascular Cardiovascular Symptoms Reported: No symptoms reported Does patient have uncontrolled Hypertension?: No Cardiovascular Management Strategies: Routine screening, Medication therapy, Coping strategies, Diet modification  Respiratory Respiratory Symptoms Reported: No symptoms reported Other Respiratory Symptoms: Denies shortness of breath/ cough; sounds to be in no respiratory distress throughout [facility] call today    Endocrine Endocrine Symptoms Reported: No symptoms reported Is patient diabetic?: Yes Is patient checking blood sugars at home?: Yes List most recent blood sugar readings, include date and time of day: Patient declines review of blood sugars  today: reports the only thing I am concerned about right now is getting my pain medication, my blood sugars are fine Confirms she continues to monitor/ record blood sugars at home and continues taking 20 U long acting insulin  every day    Gastrointestinal Gastrointestinal Symptoms Reported: No symptoms reported Other Gastrointestinal Symptoms: Reports continues having normal and regular bowel movements Gastrointestinal Management Strategies: Coping strategies, Medication therapy    Genitourinary Genitourinary Symptoms Reported: No symptoms reported Additional Genitourinary Details: Continues to report peeing fine in good amounts, clear yellow urine Genitourinary Management Strategies: Coping strategies  Integumentary Integumentary Symptoms Reported: No symptoms reported Other Integumentary Symptoms: Confirmed patient attended oncology office visit 12/30/23- reports there was no problem with the PICC line, it is fine as far as I know    Musculoskeletal Musculoskelatal Symptoms Reviewed: Unsteady gait Additional Musculoskeletal Details: confirmed uses assistive devices on regular basis, at baseline -- cane Musculoskeletal Management Strategies: Medical device, Coping strategies, Routine screening      Psychosocial Psychosocial Symptoms Reported: No symptoms reported         There were no vitals filed for this visit.  Medications Reviewed Today     Reviewed by Armaan Pond M, RN (Registered Nurse) on 01/04/24 at 1302  Med List Status: <None>   Medication Order Taking? Sig Documenting Provider Last Dose Status Informant  albuterol  (VENTOLIN  HFA) 108 (90 Base) MCG/ACT inhaler [id number]  INHALE 2  PUFFS INTO THE LUNGS EVERY 4 (FOUR) HOURS AS NEEDED FOR SHORTNESS OF BREATH. Geofm Glade PARAS, MD  Active Self, Pharmacy Records  amLODipine  (NORVASC ) 10 MG tablet [id number]  Take 1 tablet (10 mg total) by mouth daily. Geofm Glade PARAS, MD  Active Self, Pharmacy Records  cyanocobalamin  (VITAMIN  B12) 500 MCG tablet [id number]  Take 500 mcg by mouth daily. [provider]  Active Self, Pharmacy Records  fluticasone  (FLONASE ) 50 MCG/ACT nasal spray [id number]  Place 2 sprays into both nostrils daily. Geofm Glade PARAS, MD  Active Self, Pharmacy Records  glipiZIDE  (GLUCOTROL ) 10 MG tablet [id number]  TAKE 2 TABLETS BY MOUTH 2 TIMES DAILY BEFORE A MEAL. Geofm Glade PARAS, MD  Active Self, Pharmacy Records  hydrochlorothiazide  (HYDRODIURIL ) 25 MG tablet [id number]  Take 1 tablet (25 mg total) by mouth daily. Geofm Glade PARAS, MD  Active Self, Pharmacy Records  HYDROcodone -acetaminophen  [facility]) 10-325 MG tablet [id number]  Take 1-2 tablets by mouth every 4 (four) hours as needed for moderate pain (pain score 4-6) or severe pain (pain score 7-10). Patsy Lenis, MD  Active   ibuprofen  (ADVIL ) 800 MG tablet [id number]  TAKE 1 TABLET BY MOUTH TWICE DAILY AS NEEDED FOR MODERATE PAIN Burns, Glade PARAS, MD  Active Self, Pharmacy Records  insulin  glargine (LANTUS ) 100 UNIT/ML Solostar Pen [id number]  Inject 20 Units into the skin daily. Geofm Glade PARAS, MD  Active Self, Pharmacy Records  lidocaine -prilocaine  (EMLA ) cream [id number]  Apply 1 Application topically as needed (Apply to [facility] 1-2 hours prior to its use start using with 2nd treatment).  Patient not taking: Reported on 12/30/2023   Cloretta Arley NOVAK, MD  Active   linaclotide  (LINZESS ) 145 MCG CAPS capsule [id number]  TAKE 1 CAPSULE BY MOUTH DAILY BEFORE BREAKFAST.  Patient not taking: Reported on 12/30/2023   Geofm Glade PARAS, MD  Active Self, Pharmacy Records  metFORMIN  (GLUCOPHAGE -XR) 500 MG 24 hr tablet [id number]  TAKE 2 TABLETS BY MOUTH TWICE DAILY BEFORE A MEAL  Patient taking differently: Take 500 mg by mouth 2 (two) times daily with a meal. TAKE 2 TABLETS BY MOUTH TWICE DAILY BEFORE A MEAL   Burns, Glade PARAS, MD  Active Self, Pharmacy Records  montelukast  (SINGULAIR ) 10 MG tablet [id number]  TAKE 1 TABLET BY MOUTH EVRY DAY AT BEDTIME Geofm Glade PARAS, MD   Active Self, Pharmacy Records  naloxone  (NARCAN ) nasal spray 4 mg/0.1 mL [id number]  1 spray in one nostril x1; may repeat dose in alternate nostrils q2-3min prn  Patient not taking: Reported on 12/30/2023   Geofm Glade PARAS, MD  Active Self, Pharmacy Records  ondansetron  (ZOFRAN ) 8 MG tablet [id number]  Take 1 tablet (8 mg total) by mouth every 8 (eight) hours as needed.  Patient not taking: Reported on 12/30/2023   Cloretta Arley NOVAK, MD  Active   potassium chloride  (MICRO-K ) 10 MEQ CR capsule [id number]  TAKE 1 CAPSULE BY MOUTH TWICE A DAY Burns, Glade PARAS, MD  Active Self, Pharmacy Records  prochlorperazine  (COMPAZINE ) 10 MG tablet [id number]  Take 1 tablet (10 mg total) by mouth every 6 (six) hours as needed for nausea or vomiting.  Patient not taking: Reported on 12/30/2023   Cloretta Arley NOVAK, MD  Active            Recommendation:   PCP Follow-up- as scheduled 01/17/23 Specialty provider follow-up- as scheduled: oncology provider 01/13/24; pain management provider 01/16/14 Continue Current Plan of Care  Follow Up Plan:   Telephone follow-up in 1 week-  as scheduled Friday 01/14/24 for TOC case closure if no hospital re-admission  Pls call/ message for questions,  Fateh Kindle Mckinney Garen Woolbright, RN, BSN, CCRN Alumnus RN Care Manager  Transitions of Care  [facility] - [facility] [phone]: direct office

## 2024-01-04 NOTE — Therapy (Deleted)
 OUTPATIENT PHYSICAL THERAPY  LOWER EXTREMITY ONCOLOGY EVALUATION  Patient Name: Lindsay Hardy MRN: [id number] DOB:03-22-1951, 73 y.o., female Today's Date: 01/04/2024  END OF SESSION:   Past Medical History:  Diagnosis Date   Asthma    CAD (coronary artery disease) 11/07/2003    RCA 60% stenosis, LAD diffuse   Cancer (HCC)    Pancreatic   Chicken pox    CVA (cerebrovascular accident) (HCC) 06/09/1999    right frontoparietal cortical CVA - MRI September 2001   Diabetes mellitus    Diastolic dysfunction     ejection fraction 55-65% on 2-D echo May 2006   Empty sella syndrome [facility])     based on MRI September 2001, related to obesity, hypertension, it was determined that patient has not required treatment but will be monitored with TSH, ACTH, cortisol, testosterone, prolactin, growth hormone   GERD (gastroesophageal reflux disease)     EGD June 2004 -  followed by Dr. Avram   Granulomatous disease, chronic (HCC)     in right upper lobe per x-ray December 2005   H/O bilateral cataract extraction    Hyperlipidemia    Hypertension    NAFLD (nonalcoholic fatty liver disease)     based on ultrasound June 2002, elevated transaminase   OA (osteoarthritis)     DJP coracoclavicular ligament, left patella osteophytes June 2002, L4-S1 spondylosis, degenerative disc disease with bulge. I in October 2001, the C2-C4 spondylosis without stenosis and with spurs based on x-ray July 2005; diffuse idiopathic skeletal hyperostosis with bilateral hip lumbar degenerative changes   on x-ray February 2005, bilateral plantar calcaneal spurs   Reactive airway disease     peak flow 180 in May 2005, chronic sinusitis with PND, bronchitis December 2002 and strep pneumonia April 2007,  FEC 66, MCV 60, ratio is 69, DLCO 57,  moderate restrictive and mild obstructive disease  on spirometry May 2005   Past Surgical History:  Procedure Laterality Date   BILIARY BRUSHING  12/08/2023   Procedure: BRUSH BIOPSY,  BILE DUCT;  Surgeon: Rosalie Kitchens, MD;  Location[facility] ENDOSCOPY;  Service: Gastroenterology;;   BILIARY STENT PLACEMENT  12/08/2023   Procedure: INSERTION, STENT, BILE DUCT;  Surgeon: Rosalie Kitchens, MD;  Location: [facility] ENDOSCOPY;  Service: Gastroenterology;;   ORIN MEDIATE RELEASE  1992    left arm 1992   CHOLECYSTECTOMY  2003   ERCP N/A 12/08/2023   Procedure: ERCP, WITH INTERVENTION IF INDICATED;  Surgeon: Rosalie Kitchens, MD;  Location: [facility] ENDOSCOPY;  Service: Gastroenterology;  Laterality: N/A;   PORTACATH PLACEMENT Left 12/27/2023   Procedure: INSERTION, TUNNELED CENTRAL VENOUS DEVICE, WITH PORT;  Surgeon: Dasie Leonor CROME, MD;  Location: [facility] OR;  Service: General;  Laterality: Left;  PORT-A-CATH INSERTION   TONSILLECTOMY AND ADENOIDECTOMY     Patient Active Problem List   Diagnosis Date Noted   Cancer of head of pancreas (HCC) 12/16/2023   Elevated LFTs 12/08/2023   Type 2 diabetes mellitus with hyperglycemia (HCC) 12/08/2023   Obstructive jaundice 12/08/2023   Jaundice 12/07/2023   Elevated lipase 11/23/2023   Abnormal CT scan, gastrointestinal tract 11/23/2023   Diverticulitis of duodenum 10/28/2023   Urinary frequency 10/27/2023   Lower abdominal pain 10/27/2023   Gastroenteritis 07/19/2023   Bilateral low back pain without sciatica 03/23/2023   Asthma 11/04/2022   B12 deficiency 11/03/2022   Vaginal candidiasis 05/06/2022   Statin myopathy 01/15/2022   Aortic atherosclerosis (HCC) 11/05/2020   Arthropathy of lumbar facet joint 02/27/2020   Allergic rhinitis 11/22/2018  External hemorrhoids 10/07/2018   Chronic constipation 10/07/2018   Morbid obesity (HCC) 04/01/2017   PAD (peripheral artery disease) (HCC) 10/29/2016   Chronic back pain 09/23/2016   Diabetic peripheral neuropathy (HCC) 02/05/2016   Dysuria 01/14/2016   Hypokalemia 04/24/2007   Microcytic anemia 06/27/2006   Diabetes (HCC) 06/17/2006   Hyperlipidemia 06/17/2006   Essential hypertension 06/17/2006   Coronary  atherosclerosis 06/17/2006   DIASTOLIC DYSFUNCTION 06/17/2006   DISEASE, CEREBROVASCULAR NEC 06/17/2006   Osteoarthritis of both knees 06/17/2006    PCP: Glade Hope, MD  REFERRING PROVIDER: Arley Hof, MD  REFERRING DIAG:  Diagnosis  C25.9 (ICD-10-CM) - Pancreatic adenocarcinoma (HCC)    THERAPY DIAG:  No diagnosis found.  ONSET DATE: 10/07/23  Rationale for Evaluation and Treatment: Rehabilitation  SUBJECTIVE:                                                                                                                                                                                           SUBJECTIVE STATEMENT: ***  PERTINENT HISTORY: new diagnosis of pancreatic cancer.  Deconditioned s/p long hospitalization.Will be starting chemotherapy and then have surgical resection. OA, asthma, HTN, chronic back pain that limits ambulation - mostly uses RW.   PAIN:  Are you having pain? {yes/no:20286} NPRS scale: ***/10 Pain location: *** Pain orientation: {Pain Orientation:25161}  PAIN TYPE: {type:313116} Pain description: {PAIN DESCRIPTION:21022940}  Aggravating factors: *** Relieving factors: ***  PRECAUTIONS: chronic back pain limits ambulation, asthma, diabetes, diabetic neuropathy. New onset of Pancreatic cancer pending chemo and surgery.  RED FLAGS: {PT Red Flags:29287}   WEIGHT BEARING RESTRICTIONS: Yes limited by back pain, mostly used WC  FALLS:  Has patient fallen in last 6 months? {fallsyesno:27318}  LIVING ENVIRONMENT: Lives with: {[facility] lives with:25569::lives with their family} Lives in: {Lives in:25570} Stairs: {yes/no:20286}; {Stairs:24000} Has following equipment at home: {Assistive devices:23999}  OCCUPATION: ***  LEISURE: ***  PRIOR LEVEL OF FUNCTION: {PLOF:24004}  PATIENT GOALS: ***   OBJECTIVE: Note: Objective measures were completed at Evaluation unless otherwise noted.  COGNITION: Overall cognitive status:  {cognition:24006}   PALPATION: ***  OBSERVATIONS / OTHER ASSESSMENTS: ***  SENSATION: Light touch: {intact/deficits:24005}   POSTURE: forward head, rounded shoulders  LOWER EXTREMITY STRENGTH:  MMT Right eval  Hip flexion   Hip extension   Hip abduction   Hip adduction   Hip internal rotation   Hip external rotation   Knee flexion   Knee extension   Ankle dorsiflexion   Ankle plantarflexion   Ankle inversion   Ankle eversion   Great toe extension    (Blank rows = not tested)  MMT LEFT eval  Hip flexion  Hip extension   Hip abduction   Hip adduction   Hip internal rotation   Hip external rotation   Knee flexion   Knee extension   Ankle dorsiflexion   Ankle plantarflexion   Ankle inversion   Ankle eversion   Great toe extension     (Blank rows = not tested)  FUNCTIONAL TESTS:  {Functional tests:24029}  GAIT: Distance walked: *** Assistive device utilized: {Assistive devices:23999} Level of assistance: {Levels of assistance:24026} Comments: ***  Outcome measure:                                                                                                                            TREATMENT DATE: ***   PATIENT EDUCATION:  Education details: *** Person educated: {Person educated:25204} Education method: {Education Method:25205} Education comprehension: {Education Comprehension:25206}  HOME EXERCISE PROGRAM: ***  ASSESSMENT:  CLINICAL IMPRESSION: Patient is a 73 y.o. female who was seen today for physical therapy evaluation and treatment for significant deconditioning after hospitalization and recent diagnosis of Pancreatic cancer. She is pending 6-8 cycles chemotherapy and surgery.   OBJECTIVE IMPAIRMENTS: {opptimpairments:25111}.   ACTIVITY LIMITATIONS: {activitylimitations:27494}  PARTICIPATION LIMITATIONS: {participationrestrictions:25113}  PERSONAL FACTORS: {Personal factors:25162} are also affecting patient's functional outcome.    REHAB POTENTIAL: {rehabpotential:25112}  CLINICAL DECISION MAKING: {clinical decision making:25114}  EVALUATION COMPLEXITY: {Evaluation complexity:25115}   GOALS: Goals reviewed with patient? {yes/no:20286}  SHORT TERM GOALS: Target date: ***  *** Baseline: Goal status: INITIAL  2.  *** Baseline:  Goal status: INITIAL  3.  *** Baseline:  Goal status: INITIAL  4.  *** Baseline:  Goal status: INITIAL  5.  *** Baseline:  Goal status: INITIAL  6.  *** Baseline:  Goal status: INITIAL  LONG TERM GOALS: Target date: ***  *** Baseline:  Goal status: INITIAL  2.  *** Baseline:  Goal status: INITIAL  3.  *** Baseline:  Goal status: INITIAL  4.  *** Baseline:  Goal status: INITIAL  5.  *** Baseline:  Goal status: INITIAL  6.  *** Baseline:  Goal status: INITIAL  PLAN:  PT FREQUENCY: {rehab frequency:25116}  PT DURATION: {rehab duration:25117}  PLANNED INTERVENTIONS: {rehab planned interventions:25118::Patient/Family education,Balance training,Joint mobilization,Therapeutic exercises,Therapeutic activity,Neuromuscular re-education,Gait training,Self Care}  PLAN FOR NEXT SESSION: ***   Grayce JINNY Sheldon, PT 01/04/2024, 6:03 PM

## 2024-01-05 ENCOUNTER — Ambulatory Visit: Attending: Oncology

## 2024-01-05 DIAGNOSIS — G894 Chronic pain syndrome: Secondary | ICD-10-CM | POA: Diagnosis not present

## 2024-01-05 NOTE — Telephone Encounter (Signed)
 Called pt. Due to No show evaluation. Asked her to call us  back and gave phone number if she needs to reschedule.

## 2024-01-06 DIAGNOSIS — M170 Bilateral primary osteoarthritis of knee: Secondary | ICD-10-CM | POA: Diagnosis not present

## 2024-01-13 ENCOUNTER — Inpatient Hospital Stay (HOSPITAL_BASED_OUTPATIENT_CLINIC_OR_DEPARTMENT_OTHER): Admitting: Nurse Practitioner

## 2024-01-13 ENCOUNTER — Inpatient Hospital Stay

## 2024-01-13 ENCOUNTER — Inpatient Hospital Stay (HOSPITAL_BASED_OUTPATIENT_CLINIC_OR_DEPARTMENT_OTHER)
Admission: EM | Admit: 2024-01-13 | Discharge: 2024-02-07 | DRG: 919 | Disposition: E | Source: Ambulatory Visit | Attending: Pulmonary Disease | Admitting: Pulmonary Disease

## 2024-01-13 ENCOUNTER — Encounter: Payer: Self-pay | Admitting: Nutrition

## 2024-01-13 ENCOUNTER — Other Ambulatory Visit: Payer: Self-pay

## 2024-01-13 ENCOUNTER — Telehealth: Payer: Self-pay

## 2024-01-13 ENCOUNTER — Emergency Department (HOSPITAL_BASED_OUTPATIENT_CLINIC_OR_DEPARTMENT_OTHER)

## 2024-01-13 ENCOUNTER — Encounter: Payer: Self-pay | Admitting: Nurse Practitioner

## 2024-01-13 ENCOUNTER — Encounter (HOSPITAL_BASED_OUTPATIENT_CLINIC_OR_DEPARTMENT_OTHER): Payer: Self-pay | Admitting: Emergency Medicine

## 2024-01-13 VITALS — BP 106/71 | HR 140 | Temp 98.9°F | Resp 18

## 2024-01-13 VITALS — BP 129/78 | HR 96 | Temp 97.7°F | Resp 18 | Ht 65.0 in | Wt 225.8 lb

## 2024-01-13 DIAGNOSIS — T85590A Other mechanical complication of bile duct prosthesis, initial encounter: Secondary | ICD-10-CM | POA: Diagnosis present

## 2024-01-13 DIAGNOSIS — A499 Bacterial infection, unspecified: Secondary | ICD-10-CM | POA: Diagnosis present

## 2024-01-13 DIAGNOSIS — B952 Enterococcus as the cause of diseases classified elsewhere: Secondary | ICD-10-CM | POA: Diagnosis not present

## 2024-01-13 DIAGNOSIS — E876 Hypokalemia: Secondary | ICD-10-CM | POA: Diagnosis present

## 2024-01-13 DIAGNOSIS — C250 Malignant neoplasm of head of pancreas: Secondary | ICD-10-CM

## 2024-01-13 DIAGNOSIS — I2510 Atherosclerotic heart disease of native coronary artery without angina pectoris: Secondary | ICD-10-CM | POA: Diagnosis not present

## 2024-01-13 DIAGNOSIS — R579 Shock, unspecified: Secondary | ICD-10-CM | POA: Diagnosis present

## 2024-01-13 DIAGNOSIS — E1165 Type 2 diabetes mellitus with hyperglycemia: Secondary | ICD-10-CM | POA: Diagnosis present

## 2024-01-13 DIAGNOSIS — R7989 Other specified abnormal findings of blood chemistry: Secondary | ICD-10-CM

## 2024-01-13 DIAGNOSIS — N179 Acute kidney failure, unspecified: Secondary | ICD-10-CM | POA: Diagnosis not present

## 2024-01-13 DIAGNOSIS — I10 Essential (primary) hypertension: Secondary | ICD-10-CM | POA: Diagnosis not present

## 2024-01-13 DIAGNOSIS — Z9049 Acquired absence of other specified parts of digestive tract: Secondary | ICD-10-CM

## 2024-01-13 DIAGNOSIS — Z452 Encounter for adjustment and management of vascular access device: Secondary | ICD-10-CM | POA: Diagnosis not present

## 2024-01-13 DIAGNOSIS — R918 Other nonspecific abnormal finding of lung field: Secondary | ICD-10-CM | POA: Diagnosis not present

## 2024-01-13 DIAGNOSIS — A498 Other bacterial infections of unspecified site: Secondary | ICD-10-CM

## 2024-01-13 DIAGNOSIS — I4891 Unspecified atrial fibrillation: Secondary | ICD-10-CM | POA: Diagnosis not present

## 2024-01-13 DIAGNOSIS — E119 Type 2 diabetes mellitus without complications: Secondary | ICD-10-CM

## 2024-01-13 DIAGNOSIS — A4151 Sepsis due to Escherichia coli [E. coli]: Secondary | ICD-10-CM | POA: Diagnosis present

## 2024-01-13 DIAGNOSIS — R7881 Bacteremia: Secondary | ICD-10-CM | POA: Diagnosis present

## 2024-01-13 DIAGNOSIS — D638 Anemia in other chronic diseases classified elsewhere: Secondary | ICD-10-CM | POA: Diagnosis present

## 2024-01-13 DIAGNOSIS — A419 Sepsis, unspecified organism: Secondary | ICD-10-CM | POA: Diagnosis not present

## 2024-01-13 DIAGNOSIS — G8929 Other chronic pain: Secondary | ICD-10-CM | POA: Diagnosis present

## 2024-01-13 DIAGNOSIS — D509 Iron deficiency anemia, unspecified: Secondary | ICD-10-CM | POA: Diagnosis present

## 2024-01-13 DIAGNOSIS — C259 Malignant neoplasm of pancreas, unspecified: Secondary | ICD-10-CM

## 2024-01-13 DIAGNOSIS — E872 Acidosis, unspecified: Secondary | ICD-10-CM | POA: Diagnosis not present

## 2024-01-13 DIAGNOSIS — K760 Fatty (change of) liver, not elsewhere classified: Secondary | ICD-10-CM | POA: Diagnosis present

## 2024-01-13 DIAGNOSIS — R6521 Severe sepsis with septic shock: Secondary | ICD-10-CM | POA: Diagnosis present

## 2024-01-13 DIAGNOSIS — T8579XA Infection and inflammatory reaction due to other internal prosthetic devices, implants and grafts, initial encounter: Secondary | ICD-10-CM | POA: Diagnosis not present

## 2024-01-13 DIAGNOSIS — R404 Transient alteration of awareness: Secondary | ICD-10-CM | POA: Diagnosis not present

## 2024-01-13 DIAGNOSIS — E11649 Type 2 diabetes mellitus with hypoglycemia without coma: Secondary | ICD-10-CM | POA: Diagnosis not present

## 2024-01-13 DIAGNOSIS — Z79899 Other long term (current) drug therapy: Secondary | ICD-10-CM

## 2024-01-13 DIAGNOSIS — K838 Other specified diseases of biliary tract: Secondary | ICD-10-CM | POA: Diagnosis not present

## 2024-01-13 DIAGNOSIS — D6959 Other secondary thrombocytopenia: Secondary | ICD-10-CM | POA: Diagnosis not present

## 2024-01-13 DIAGNOSIS — A4150 Gram-negative sepsis, unspecified: Secondary | ICD-10-CM | POA: Diagnosis not present

## 2024-01-13 DIAGNOSIS — B9620 Unspecified Escherichia coli [E. coli] as the cause of diseases classified elsewhere: Secondary | ICD-10-CM | POA: Diagnosis not present

## 2024-01-13 DIAGNOSIS — Z7985 Long-term (current) use of injectable non-insulin antidiabetic drugs: Secondary | ICD-10-CM

## 2024-01-13 DIAGNOSIS — J45909 Unspecified asthma, uncomplicated: Secondary | ICD-10-CM | POA: Diagnosis present

## 2024-01-13 DIAGNOSIS — Z79633 Long term (current) use of mitotic inhibitor: Secondary | ICD-10-CM | POA: Insufficient documentation

## 2024-01-13 DIAGNOSIS — E8729 Other acidosis: Secondary | ICD-10-CM | POA: Diagnosis not present

## 2024-01-13 DIAGNOSIS — I21A1 Myocardial infarction type 2: Secondary | ICD-10-CM | POA: Diagnosis not present

## 2024-01-13 DIAGNOSIS — I214 Non-ST elevation (NSTEMI) myocardial infarction: Secondary | ICD-10-CM | POA: Diagnosis not present

## 2024-01-13 DIAGNOSIS — Y838 Other surgical procedures as the cause of abnormal reaction of the patient, or of later complication, without mention of misadventure at the time of the procedure: Secondary | ICD-10-CM | POA: Diagnosis present

## 2024-01-13 DIAGNOSIS — Z5111 Encounter for antineoplastic chemotherapy: Secondary | ICD-10-CM | POA: Insufficient documentation

## 2024-01-13 DIAGNOSIS — F17210 Nicotine dependence, cigarettes, uncomplicated: Secondary | ICD-10-CM | POA: Diagnosis present

## 2024-01-13 DIAGNOSIS — K219 Gastro-esophageal reflux disease without esophagitis: Secondary | ICD-10-CM | POA: Diagnosis present

## 2024-01-13 DIAGNOSIS — R0682 Tachypnea, not elsewhere classified: Secondary | ICD-10-CM | POA: Diagnosis not present

## 2024-01-13 DIAGNOSIS — R578 Other shock: Secondary | ICD-10-CM | POA: Diagnosis not present

## 2024-01-13 DIAGNOSIS — Z4682 Encounter for fitting and adjustment of non-vascular catheter: Secondary | ICD-10-CM | POA: Diagnosis not present

## 2024-01-13 DIAGNOSIS — I6782 Cerebral ischemia: Secondary | ICD-10-CM | POA: Diagnosis not present

## 2024-01-13 DIAGNOSIS — R140 Abdominal distension (gaseous): Secondary | ICD-10-CM | POA: Diagnosis not present

## 2024-01-13 DIAGNOSIS — T380X5A Adverse effect of glucocorticoids and synthetic analogues, initial encounter: Secondary | ICD-10-CM | POA: Diagnosis not present

## 2024-01-13 DIAGNOSIS — Z66 Do not resuscitate: Secondary | ICD-10-CM | POA: Diagnosis not present

## 2024-01-13 DIAGNOSIS — R0989 Other specified symptoms and signs involving the circulatory and respiratory systems: Secondary | ICD-10-CM | POA: Diagnosis not present

## 2024-01-13 DIAGNOSIS — Z888 Allergy status to other drugs, medicaments and biological substances status: Secondary | ICD-10-CM

## 2024-01-13 DIAGNOSIS — I700 Atherosclerosis of aorta: Secondary | ICD-10-CM | POA: Diagnosis not present

## 2024-01-13 DIAGNOSIS — D696 Thrombocytopenia, unspecified: Secondary | ICD-10-CM

## 2024-01-13 DIAGNOSIS — J9601 Acute respiratory failure with hypoxia: Secondary | ICD-10-CM

## 2024-01-13 DIAGNOSIS — R748 Abnormal levels of other serum enzymes: Secondary | ICD-10-CM | POA: Diagnosis not present

## 2024-01-13 DIAGNOSIS — K831 Obstruction of bile duct: Secondary | ICD-10-CM | POA: Diagnosis not present

## 2024-01-13 DIAGNOSIS — Z8507 Personal history of malignant neoplasm of pancreas: Secondary | ICD-10-CM | POA: Diagnosis not present

## 2024-01-13 DIAGNOSIS — Z7984 Long term (current) use of oral hypoglycemic drugs: Secondary | ICD-10-CM

## 2024-01-13 DIAGNOSIS — R0600 Dyspnea, unspecified: Secondary | ICD-10-CM | POA: Diagnosis not present

## 2024-01-13 DIAGNOSIS — A4181 Sepsis due to Enterococcus: Secondary | ICD-10-CM | POA: Diagnosis present

## 2024-01-13 DIAGNOSIS — E785 Hyperlipidemia, unspecified: Secondary | ICD-10-CM | POA: Diagnosis not present

## 2024-01-13 DIAGNOSIS — R000 Tachycardia, unspecified: Secondary | ICD-10-CM | POA: Diagnosis not present

## 2024-01-13 DIAGNOSIS — K8309 Other cholangitis: Secondary | ICD-10-CM | POA: Diagnosis not present

## 2024-01-13 DIAGNOSIS — R109 Unspecified abdominal pain: Secondary | ICD-10-CM | POA: Diagnosis not present

## 2024-01-13 DIAGNOSIS — J984 Other disorders of lung: Secondary | ICD-10-CM | POA: Diagnosis not present

## 2024-01-13 DIAGNOSIS — Z8673 Personal history of transient ischemic attack (TIA), and cerebral infarction without residual deficits: Secondary | ICD-10-CM

## 2024-01-13 DIAGNOSIS — Z8249 Family history of ischemic heart disease and other diseases of the circulatory system: Secondary | ICD-10-CM

## 2024-01-13 DIAGNOSIS — Z87891 Personal history of nicotine dependence: Secondary | ICD-10-CM | POA: Diagnosis not present

## 2024-01-13 DIAGNOSIS — R079 Chest pain, unspecified: Secondary | ICD-10-CM | POA: Diagnosis not present

## 2024-01-13 LAB — CBC WITH DIFFERENTIAL/PLATELET
Abs Immature Granulocytes: 0.89 K/uL — ABNORMAL HIGH (ref 0.00–0.07)
Basophils Absolute: 0.1 K/uL (ref 0.0–0.1)
Basophils Relative: 1 %
Eosinophils Absolute: 0.3 K/uL (ref 0.0–0.5)
Eosinophils Relative: 3 %
HCT: 36.2 % (ref 36.0–46.0)
Hemoglobin: 11.5 g/dL — ABNORMAL LOW (ref 12.0–15.0)
Immature Granulocytes: 9 %
Lymphocytes Relative: 10 %
Lymphs Abs: 1 K/uL (ref 0.7–4.0)
MCH: 24.1 pg — ABNORMAL LOW (ref 26.0–34.0)
MCHC: 31.8 g/dL (ref 30.0–36.0)
MCV: 75.9 fL — ABNORMAL LOW (ref 80.0–100.0)
Monocytes Absolute: 0.4 K/uL (ref 0.1–1.0)
Monocytes Relative: 4 %
Neutro Abs: 7.4 K/uL (ref 1.7–7.7)
Neutrophils Relative %: 73 %
Platelets: 575 K/uL — ABNORMAL HIGH (ref 150–400)
RBC: 4.77 MIL/uL (ref 3.87–5.11)
RDW: 17.1 % — ABNORMAL HIGH (ref 11.5–15.5)
WBC: 10.9 K/uL — ABNORMAL HIGH (ref 4.0–10.5)
nRBC: 1.7 % — ABNORMAL HIGH (ref 0.0–0.2)

## 2024-01-13 LAB — CBC WITH DIFFERENTIAL (CANCER CENTER ONLY)
Abs Immature Granulocytes: 0.54 K/uL — ABNORMAL HIGH (ref 0.00–0.07)
Basophils Absolute: 0 K/uL (ref 0.0–0.1)
Basophils Relative: 0 %
Eosinophils Absolute: 0 K/uL (ref 0.0–0.5)
Eosinophils Relative: 0 %
HCT: 29.1 % — ABNORMAL LOW (ref 36.0–46.0)
Hemoglobin: 9.6 g/dL — ABNORMAL LOW (ref 12.0–15.0)
Immature Granulocytes: 4 %
Lymphocytes Relative: 11 %
Lymphs Abs: 1.3 K/uL (ref 0.7–4.0)
MCH: 24.6 pg — ABNORMAL LOW (ref 26.0–34.0)
MCHC: 33 g/dL (ref 30.0–36.0)
MCV: 74.6 fL — ABNORMAL LOW (ref 80.0–100.0)
Monocytes Absolute: 1.3 K/uL — ABNORMAL HIGH (ref 0.1–1.0)
Monocytes Relative: 11 %
Neutro Abs: 9.2 K/uL — ABNORMAL HIGH (ref 1.7–7.7)
Neutrophils Relative %: 74 %
Platelet Count: 547 K/uL — ABNORMAL HIGH (ref 150–400)
RBC: 3.9 MIL/uL (ref 3.87–5.11)
RDW: 16.7 % — ABNORMAL HIGH (ref 11.5–15.5)
WBC Count: 12.4 K/uL — ABNORMAL HIGH (ref 4.0–10.5)
nRBC: 0 % (ref 0.0–0.2)

## 2024-01-13 LAB — CMP (CANCER CENTER ONLY)
ALT: 66 U/L — ABNORMAL HIGH (ref 0–44)
AST: 46 U/L — ABNORMAL HIGH (ref 15–41)
Albumin: 2.7 g/dL — ABNORMAL LOW (ref 3.5–5.0)
Alkaline Phosphatase: 385 U/L — ABNORMAL HIGH (ref 38–126)
Anion gap: 12 (ref 5–15)
BUN: 16 mg/dL (ref 8–23)
CO2: 26 mmol/L (ref 22–32)
Calcium: 8.7 mg/dL — ABNORMAL LOW (ref 8.9–10.3)
Chloride: 94 mmol/L — ABNORMAL LOW (ref 98–111)
Creatinine: 0.96 mg/dL (ref 0.44–1.00)
GFR, Estimated: >60 mL/min (ref >60)
Glucose, Bld: 363 mg/dL — ABNORMAL HIGH (ref 70–99)
Potassium: 3.2 mmol/L — ABNORMAL LOW (ref 3.5–5.1)
Sodium: 132 mmol/L — ABNORMAL LOW (ref 135–145)
Total Bilirubin: 1 mg/dL (ref 0.0–1.2)
Total Protein: 6.5 g/dL (ref 6.5–8.1)

## 2024-01-13 LAB — COMPREHENSIVE METABOLIC PANEL WITH GFR
ALT: 114 U/L — ABNORMAL HIGH (ref 0–44)
AST: 230 U/L — ABNORMAL HIGH (ref 15–41)
Albumin: 2.8 g/dL — ABNORMAL LOW (ref 3.5–5.0)
Alkaline Phosphatase: 693 U/L — ABNORMAL HIGH (ref 38–126)
Anion gap: 22 — ABNORMAL HIGH (ref 5–15)
BUN: 17 mg/dL (ref 8–23)
CO2: 18 mmol/L — ABNORMAL LOW (ref 22–32)
Calcium: 9 mg/dL (ref 8.9–10.3)
Chloride: 94 mmol/L — ABNORMAL LOW (ref 98–111)
Creatinine, Ser: 1.14 mg/dL — ABNORMAL HIGH (ref 0.44–1.00)
GFR, Estimated: 50 mL/min — ABNORMAL LOW (ref >60)
Glucose, Bld: 262 mg/dL — ABNORMAL HIGH (ref 70–99)
Potassium: 3.6 mmol/L (ref 3.5–5.1)
Sodium: 133 mmol/L — ABNORMAL LOW (ref 135–145)
Total Bilirubin: 2.1 mg/dL — ABNORMAL HIGH (ref 0.0–1.2)
Total Protein: 7.2 g/dL (ref 6.5–8.1)

## 2024-01-13 LAB — TROPONIN T, HIGH SENSITIVITY
Troponin T High Sensitivity: 31 ng/L — ABNORMAL HIGH (ref <19)
Troponin T High Sensitivity: 40 ng/L — ABNORMAL HIGH (ref <19)

## 2024-01-13 LAB — MRSA NEXT GEN BY PCR, NASAL: MRSA by PCR Next Gen: NOT DETECTED

## 2024-01-13 LAB — LIPASE, BLOOD: Lipase: 12 U/L (ref 11–51)

## 2024-01-13 LAB — GLUCOSE, CAPILLARY
Glucose-Capillary: 183 mg/dL — ABNORMAL HIGH (ref 70–99)
Glucose-Capillary: 189 mg/dL — ABNORMAL HIGH (ref 70–99)

## 2024-01-13 LAB — RESP PANEL BY RT-PCR (RSV, FLU A&B, COVID)  RVPGX2
Influenza A by PCR: NEGATIVE
Influenza B by PCR: NEGATIVE
Resp Syncytial Virus by PCR: NEGATIVE
SARS Coronavirus 2 by RT PCR: NEGATIVE

## 2024-01-13 LAB — LACTIC ACID, PLASMA
Lactic Acid, Venous: 6.7 mmol/L (ref 0.5–1.9)
Lactic Acid, Venous: 4.9 mmol/L (ref 0.5–1.9)

## 2024-01-13 MED ORDER — INSULIN ASPART 100 UNIT/ML IJ SOLN
0.0000 [IU] | INTRAMUSCULAR | Status: DC
  Administered 2024-01-13: 3 [IU] via SUBCUTANEOUS
  Administered 2024-01-14: 2 [IU] via SUBCUTANEOUS
  Administered 2024-01-14: 3 [IU] via SUBCUTANEOUS
  Administered 2024-01-14 (×3): 2 [IU] via SUBCUTANEOUS
  Administered 2024-01-15 (×3): 3 [IU] via SUBCUTANEOUS
  Administered 2024-01-15: 2 [IU] via SUBCUTANEOUS
  Administered 2024-01-15 – 2024-01-16 (×3): 3 [IU] via SUBCUTANEOUS
  Administered 2024-01-16: 5 [IU] via SUBCUTANEOUS
  Administered 2024-01-16: 3 [IU] via SUBCUTANEOUS

## 2024-01-13 MED ORDER — SODIUM CHLORIDE 0.9 % IV SOLN
INTRAVENOUS | Status: DC
Start: 2024-01-13 — End: 2024-01-13

## 2024-01-13 MED ORDER — SODIUM CHLORIDE 0.9 % IV BOLUS
1000.0000 mL | Freq: Once | INTRAVENOUS | Status: AC
  Administered 2024-01-13: 1000 mL via INTRAVENOUS

## 2024-01-13 MED ORDER — DOCUSATE SODIUM 100 MG PO CAPS: 100.0000 mg | ORAL_CAPSULE | Freq: Two times a day (BID) | ORAL | Status: DC | PRN

## 2024-01-13 MED ORDER — VASOPRESSIN 20 UNITS/100 ML INFUSION FOR SHOCK: 0.0400 [IU]/min | INTRAVENOUS | Status: DC

## 2024-01-13 MED ORDER — PACLITAXEL PROTEIN-BOUND CHEMO INJECTION 100 MG
100.0000 mg/m2 | Freq: Once | INTRAVENOUS | Status: AC
  Administered 2024-01-13: 200 mg via INTRAVENOUS
  Filled 2024-01-13: qty 40

## 2024-01-13 MED ORDER — ORAL CARE MOUTH RINSE: 15.0000 mL | OROMUCOSAL | Status: DC | PRN

## 2024-01-13 MED ORDER — FENTANYL CITRATE PF 50 MCG/ML IJ SOSY
50.0000 ug | PREFILLED_SYRINGE | Freq: Once | INTRAMUSCULAR | Status: AC
  Administered 2024-01-13: 50 ug via INTRAVENOUS
  Filled 2024-01-13: qty 1

## 2024-01-13 MED ORDER — SODIUM CHLORIDE 0.9 % IV SOLN
2000.0000 mg | Freq: Once | INTRAVENOUS | Status: AC
  Administered 2024-01-13: 2000 mg via INTRAVENOUS
  Filled 2024-01-13: qty 52.6

## 2024-01-13 MED ORDER — ENOXAPARIN SODIUM 40 MG/0.4ML IJ SOSY
40.0000 mg | PREFILLED_SYRINGE | INTRAMUSCULAR | Status: DC
  Administered 2024-01-14 – 2024-01-15 (×2): 40 mg via SUBCUTANEOUS
  Filled 2024-01-13 (×2): qty 0.4

## 2024-01-13 MED ORDER — ACETAMINOPHEN 500 MG PO TABS
1000.0000 mg | ORAL_TABLET | Freq: Once | ORAL | Status: AC
  Administered 2024-01-13: 1000 mg via ORAL
  Filled 2024-01-13: qty 2

## 2024-01-13 MED ORDER — PROCHLORPERAZINE MALEATE 10 MG PO TABS
10.0000 mg | ORAL_TABLET | Freq: Once | ORAL | Status: AC
  Administered 2024-01-13: 10 mg via ORAL
  Filled 2024-01-13: qty 1

## 2024-01-13 MED ORDER — SODIUM CHLORIDE 0.9% FLUSH: 10.0000 mL | INTRAVENOUS | Status: DC | PRN

## 2024-01-13 MED ORDER — LORAZEPAM 1 MG PO TABS
0.5000 mg | ORAL_TABLET | Freq: Once | ORAL | Status: AC
  Administered 2024-01-13: 0.5 mg via ORAL
  Filled 2024-01-13: qty 1

## 2024-01-13 MED ORDER — SODIUM CHLORIDE 0.9 % IV SOLN
2.0000 g | Freq: Once | INTRAVENOUS | Status: AC
  Administered 2024-01-13: 2 g via INTRAVENOUS
  Filled 2024-01-13: qty 12.5

## 2024-01-13 MED ORDER — LACTATED RINGERS IV SOLN: INTRAVENOUS | Status: DC

## 2024-01-13 MED ORDER — METRONIDAZOLE 500 MG/100ML IV SOLN
500.0000 mg | Freq: Once | INTRAVENOUS | Status: AC
  Administered 2024-01-13: 500 mg via INTRAVENOUS
  Filled 2024-01-13: qty 100

## 2024-01-13 MED ORDER — SODIUM CHLORIDE 0.9 % IV SOLN: Freq: Once | INTRAVENOUS | Status: AC

## 2024-01-13 MED ORDER — VANCOMYCIN HCL IN DEXTROSE 1-5 GM/200ML-% IV SOLN: 1000.0000 mg | INTRAVENOUS | Status: DC

## 2024-01-13 MED ORDER — POLYETHYLENE GLYCOL 3350 17 G PO PACK: 17.0000 g | PACK | Freq: Every day | ORAL | Status: DC | PRN

## 2024-01-13 MED ORDER — IOHEXOL 350 MG/ML SOLN
100.0000 mL | Freq: Once | INTRAVENOUS | Status: AC | PRN
  Administered 2024-01-13: 100 mL via INTRAVENOUS

## 2024-01-13 MED ORDER — VANCOMYCIN HCL IN DEXTROSE 1-5 GM/200ML-% IV SOLN
1000.0000 mg | Freq: Once | INTRAVENOUS | Status: AC
  Filled 2024-01-13: qty 200

## 2024-01-13 MED ORDER — VANCOMYCIN HCL IN DEXTROSE 1-5 GM/200ML-% IV SOLN
1000.0000 mg | Freq: Once | INTRAVENOUS | Status: AC
  Administered 2024-01-13: 1000 mg via INTRAVENOUS

## 2024-01-13 MED ORDER — HEPARIN SOD (PORK) LOCK FLUSH 100 UNIT/ML IV SOLN: 500.0000 [IU] | Freq: Once | INTRAVENOUS | Status: DC | PRN

## 2024-01-13 MED ORDER — SODIUM CHLORIDE 0.9% FLUSH
10.0000 mL | Freq: Two times a day (BID) | INTRAVENOUS | Status: DC
  Administered 2024-01-13 – 2024-01-15 (×3): 10 mL
  Administered 2024-01-15 – 2024-01-16 (×2): 20 mL
  Administered 2024-01-16: 10 mL
  Administered 2024-01-17: 30 mL
  Administered 2024-01-17: 10 mL
  Administered 2024-01-17: 30 mL
  Administered 2024-01-17: 10 mL

## 2024-01-13 MED ORDER — NOREPINEPHRINE 4 MG/250ML-% IV SOLN
0.0000 ug/min | INTRAVENOUS | Status: DC
  Administered 2024-01-13: 4 ug/min via INTRAVENOUS
  Administered 2024-01-13: 13 ug/min via INTRAVENOUS
  Administered 2024-01-14: 6 ug/min via INTRAVENOUS
  Administered 2024-01-14: 14 ug/min via INTRAVENOUS
  Administered 2024-01-15: 10 ug/min via INTRAVENOUS
  Filled 2024-01-13 (×5): qty 250

## 2024-01-13 MED ORDER — POTASSIUM CHLORIDE CRYS ER 20 MEQ PO TBCR
20.0000 meq | EXTENDED_RELEASE_TABLET | Freq: Once | ORAL | Status: AC
  Administered 2024-01-13: 20 meq via ORAL
  Filled 2024-01-13: qty 1

## 2024-01-13 MED ORDER — CHLORHEXIDINE GLUCONATE CLOTH 2 % EX PADS
6.0000 | MEDICATED_PAD | Freq: Every day | CUTANEOUS | Status: DC
  Administered 2024-01-15: 6 via TOPICAL

## 2024-01-13 MED ORDER — PIPERACILLIN-TAZOBACTAM 3.375 G IVPB
3.3750 g | Freq: Three times a day (TID) | INTRAVENOUS | Status: DC
  Administered 2024-01-13 – 2024-01-17 (×12): 3.375 g via INTRAVENOUS
  Filled 2024-01-13 (×10): qty 50

## 2024-01-13 MED ORDER — VANCOMYCIN HCL 2000 MG/400ML IV SOLN: 2000.0000 mg | Freq: Once | INTRAVENOUS | Status: DC

## 2024-01-13 MED ORDER — VANCOMYCIN HCL IN DEXTROSE 1-5 GM/200ML-% IV SOLN
1000.0000 mg | Freq: Once | INTRAVENOUS | Status: DC
  Filled 2024-01-13: qty 200

## 2024-01-13 MED ORDER — SODIUM CHLORIDE 0.9 % IV BOLUS
2000.0000 mL | Freq: Once | INTRAVENOUS | Status: AC
  Administered 2024-01-13: 2000 mL via INTRAVENOUS

## 2024-01-13 NOTE — Sepsis Progress Note (Signed)
 Elink will follow per sepsis protocol.

## 2024-01-13 NOTE — ED Notes (Signed)
 Trop and LA to obtained after the Fluid is fully done... Per Provider.SABRA

## 2024-01-13 NOTE — ED Notes (Signed)
 RRT to assess for fluid in lungs after amount of fluids given.SABRASABRA

## 2024-01-13 NOTE — ED Notes (Signed)
 Report given to [facility].

## 2024-01-13 NOTE — ED Notes (Signed)
 Consulted [facility] for Dr. Curatolo

## 2024-01-13 NOTE — Telephone Encounter (Signed)
 Called downstairs to the ED, spoke with charge nurse Signe. Made aware one of our patients in the infusion area is Tachycardic and has acute mental status changes. Charge nurse stated to go ahead and bring patient down. Myself, charge nurse Geofm RN took patient via wheelchair down to the ED and nurse was given report.

## 2024-01-13 NOTE — Progress Notes (Addendum)
 Lindsay Hardy OFFICE PROGRESS NOTE   Diagnosis: Pancreas cancer  INTERVAL HISTORY:   Lindsay Hardy returns as scheduled.  She completed cycle 1 gemcitabine /Abraxane  12/30/2023.  She denies nausea/vomiting.  No mouth sores.  No diarrhea.  No fever or rash after treatment.  She has intermittent numbness in the fingers on her left hand.  No bleeding.  She reports a good appetite.  She thinks she is losing weight because she has no one to prepare her food.  She is not drinking nutritional supplements because she does not have any.  She has periodic postprandial abdominal discomfort.  She has periodic dyspnea which she attributes to asthma.  She notes that she fatigues easily and feels weak at times.  She occasionally feels cold.  No shaking chills.  No fever.  She confirms she is not taking potassium and does not administer insulin  on a regular basis.  Objective:  Vital signs in last 24 hours:  Blood pressure 129/78, pulse 96, temperature 97.7 F (36.5 C), temperature source Oral, resp. rate 18, height 5' 5 (1.651 m), weight 225 lb 12.8 oz (102.4 kg), SpO2 99%.    HEENT: No thrush or ulcers. Resp: Lungs clear bilaterally. Cardio: Regular rate and rhythm. GI: No hepatosplenomegaly.  Nontender. Vascular: No leg edema. Port-A-Cath without erythema.  Lab Results:  Lab Results  Component Value Date   WBC 12.4 (H) 01/13/2024   HGB 9.6 (L) 01/13/2024   HCT 29.1 (L) 01/13/2024   MCV 74.6 (L) 01/13/2024   PLT 547 (H) 01/13/2024   NEUTROABS 9.2 (H) 01/13/2024    Imaging:  No results found.  Medications: I have reviewed the patient's current medications.  Assessment/Plan: Pancreas cancer, stage Ib (cT2c N0) 12/02/2023 CT abdomen/pelvis: Ill-defined hypoattenuating pancreas head/uncinate lesion with pancreatic biliary duct dilation, unchanged adjacent cystic focus in the pancreas uncinate 8, incompletely characterized 1.5 cm hypodense left lower pole and 1.5 cm  hypoattenuating focus in the lower pole left kidney 12/07/2023 CT Abdo/pelvis: Dilation of the intra and extrahepatic biliary tree, ill-defined low attenuating area in the region of the uncinate of the pancreas with a small adjacent cyst, no adenopathy 12/07/2023: MRI abdomen/MRCP: Severe intra and exophytic biliary duct dilation, 3.4 x 2.3 cm mass in the inferior pancreas head and uncinate 8 with effacement of the pancreatic and common ducts, cystic change in the adjacent uncinate 8, mass abuts the central dural SMV with less than 50% contact, fat planes preserved to the SMA, no enlarged lymph nodes, kidneys are normal  12/08/2023 ERCP bile duct brushing-adenocarcinoma 12/09/2023: CT chest-centrilobular emphysema, no evidence of metastatic disease Cycle 1 gemcitabine /Abraxane  12/30/2023 Cycle 2 gemcitabine /Abraxane  01/13/2024 Obstructive jaundice secondary to 1, status post placement of a covered common duct stent 12/08/2023 Diabetes CVA in 2001 Empty sella syndrome Hypertension GERD Chronic back pain Indeterminate left renal lesions on CT abdomen/pelvis 12/01/2023, not mentioned on CT/pelvis 10/28/2023 or MRI abdomen 12/07/2023 Microcytic anemia  Disposition: Lindsay Hardy appears stable.  She has completed 1 cycle of gemcitabine /Abraxane .  She tolerated without significant acute toxicity.  Plan to proceed with cycle 2 today as scheduled.  I reviewed the weight loss with the [facility] pharmacist.  No dose reduction needed.  CBC and chemistry panel reviewed.  Labs are adequate for treatment.  Bilirubin is normal, AST/ALT further improved.  She has mild hypokalemia.  She indicates she is not taking potassium as prescribed.  She will resume.  Blood glucose is 363.  She reports intermittent compliance with insulin .  She will work  on taking as prescribed.  She has a microcytic anemia.  She will complete stool cards.  We will check a ferritin level.  She has a good appetite and feels she could eat more but needs  assistance with food preparation.  We will ask the [facility] social worker and dietitian to follow-up with her.  She will return for follow-up and cycle 3 gemcitabine /Abraxane  in 2 weeks.  We are available to see her sooner if needed.    Olam Ned ANP/GNP-BC   01/13/2024  10:09 AM   Addendum-following completion of chemotherapy after Port-A-Cath was deaccessed she reported feeling an increased heart rate.  She was noted to have significant tachycardia, confirmed with EKG, appeared to be sinus tachycardia.  Blood pressure was normal.  She denied other symptoms specifically shortness of breath chest pain, lightheadedness/dizziness.  The tachycardia persisted.  She was moved into a wheelchair to proceed to the emergency department.  At that time she developed altered mental status with lethargy, limited verbal response.  She was transported to the emergency department.

## 2024-01-13 NOTE — ED Notes (Signed)
 Report given to the Floor RN.Marland KitchenMarland Kitchen

## 2024-01-13 NOTE — Progress Notes (Signed)
 Patient seen by Lonna Cobb NP today  Vitals are within treatment parameters:Yes   Labs are within treatment parameters: Yes   Treatment plan has been signed: Yes   Per physician team, Patient is ready for treatment and there are NO modifications to the treatment plan.

## 2024-01-13 NOTE — ED Notes (Signed)
 Only able to collect one set of cultures before the start of the Antibiotics... Provider aware.SABRASABRA

## 2024-01-13 NOTE — Progress Notes (Signed)
 eLink Physician-Brief Progress Note Patient Name: Lindsay Hardy DOB: [birth year] MRN: [id number]   Date of Service  01/13/2024  HPI/Events of Note  73 year old female with history of CVA metabolic syndrome, morbid obesity and pancreatic adenocarcinoma on chemotherapy who is admitted to the ICU with septic shock thought to be secondary to a biliary source.  Patient is febrile, tachypneic, tachycardic and hypotensive.  Currently on norepinephrine  infusion and received broad-spectrum antibiotics.  Results show mild electrolyte disturbances, elevated creatinine, transaminitis, resolving lactic acidosis and thrombocytosis.  Imaging reviewed.  eICU Interventions  Maintain broad-spectrum antibiotics.  Cultures pending.  Anticipate GI evaluation.  Continue crystalloid resuscitation, maintain norepinephrine , add vasopressin   DVT prophylaxis with Lovenox  GI prophylaxis not indicated   0309 -patient is saying that she was having a hard time breathing.  Oxygen  applied.  Clear lung sounds.  The patient has been tachypneic and on observation, the patient is in no respiratory distress.  No intervention indicated at this point  0411 -requesting anxiolytic, will add on oral Ativan  for limited doses  Intervention Category Evaluation Type: New Patient Evaluation  Lindsay Hardy 01/13/2024, 9:52 PM

## 2024-01-13 NOTE — H&P (Signed)
 NAME:  Lindsay Hardy, MRN:  [id number], DOB:  [birth year], LOS: 0 ADMISSION DATE:  01/13/2024, CONSULTATION DATE:  8/7 REFERRING MD:  Dr. Ruthe, CHIEF COMPLAINT:  Hypotension   History of Present Illness:  73 year old female with PMH as below, which is significant for CVA, DM, HTN, and HLD. She was admitted July 1st to [facility] for abdominal pain, at which point she was unfortunately discovered to have adenocarcinoma of the pancreas.  During that admission she also underwent ERCP with sphincterotomy and common bile duct stent placement. After discharge, she followed with Dr. Deanne in the oncology clinic. She was initiated on gemcitabine /Abraxane . Port was placed 7/21. First chemo treatment 7/24. She had her second chemo treatment 8/7. Was having some chills prior to treatment, but proceeded regardless. Immediately following the infusion she developed tachycardia to the 150s and was sent to the ED. Primary workup in the ED concerning for chemo reaction vs infection vs less likely anaphylaxis. Laboratory evaluation significant for WBC 10.9, mildly elevated LFT, alk phos 693, and t bili 2.1. Lactic acid 6.7. CTs of the head and chest/abd/pelvis were obtained. Some concern for colitis as well as hypo-enhancing areas around the liver. Surgery was consulted and did not think these represented abscesses. Raised question of issue with CBD stent. GI was consulted as well. She was started on empiric antibiotics. She then became hypotensive, and remained hypotensive despite 4L crystalloid IVF. She was initiated on Levophed  and PCCM has been asked to accept the patient for admission to the ICU in the setting of shock.   Pertinent  Medical History   has a past medical history of Asthma, CAD (coronary artery disease) (11/07/2003), Cancer (HCC), Chicken pox, CVA (cerebrovascular accident) (HCC) (06/09/1999), Diabetes mellitus, Diastolic dysfunction, Empty sella syndrome (HCC), GERD (gastroesophageal reflux disease),  Granulomatous disease, chronic (HCC), H/O bilateral cataract extraction, Hyperlipidemia, Hypertension, NAFLD (nonalcoholic fatty liver disease), OA (osteoarthritis), and Reactive airway disease.   Significant Hospital Events: Including procedures, antibiotic start and stop dates in addition to other pertinent events   8/7 presented to [facility] following chemotherapy. Admitted to ICU with possible sepsis on pressors.   Interim History / Subjective:    Objective    Blood pressure (!) 74/41, pulse (!) 128, temperature (!) 100.8 F (38.2 C), temperature source Rectal, resp. rate (!) 33, SpO2 99%.        Intake/Output Summary (Last 24 hours) at 01/13/2024 1944 Last data filed at 01/13/2024 1929 Gross per 24 hour  Intake 3205.86 ml  Output --  Net 3205.86 ml   There were no vitals filed for this visit.  Examination: General: adult overweight female in NAD HENT: [place]/AT, PERRL, no JVD Lungs: Clear bilateral breath sounds Cardiovascular: Tachy, regular, no MRG Abdomen: Soft, NT, ND Extremities: No acute deformity or ROM limitation Neuro: Alert, oriented, non-focal.   Resolved problem list   Assessment and Plan    Shock: Based on recent history most likely etiology would be septic with a biliary source, although she doesn't have traditional complaints. Really doesn't have any specific complaints. RN noted malodorous urine. Consider urosepsis. CT abdomen mentions maybe colitis, but not symptomatic.  - Empiric zosyn /vancomycin  - Cultures - UA pending - Echo - Hold home amlodipine  and hydrochlorothiazide  - GI to see  Anion gap acidosis (lactic) -trend lactic acid and chemistry  Elevated troponin: 31 likely demand -Trend troponin  Adenocarcinoma of the pancreas with biliary obstruction: ERCP and stent placed 7/2 - Last chemo 8/7 - Tx on hold pending  clinical improvement.  - GI to see in AM - NPO after midnight  DM - Hold home glipizide , metformin  - CBG  monitoring and SSI  Hypokalemia - Supp K    Best Practice (right click and Reselect all SmartList Selections daily)   Diet/type: NPO DVT prophylaxis LMWH Pressure ulcer(s): pressure ulcer assessment deferred  GI prophylaxis: N/A Lines: N/A - port Foley:  N/A Code Status:  full code - discussed with patient upon arrival to ICU Last date of multidisciplinary goals of care discussion [ ]   Labs   CBC: Recent Labs  Lab 01/13/24 0915 01/13/24 1601  WBC 12.4* 10.9*  NEUTROABS 9.2* 7.4  HGB 9.6* 11.5*  HCT 29.1* 36.2  MCV 74.6* 75.9*  PLT 547* 575*    Basic Metabolic Panel: Recent Labs  Lab 01/13/24 0915 01/13/24 1601  NA 132* 133*  K 3.2* 3.6  CL 94* 94*  CO2 26 18*  GLUCOSE 363* 262*  BUN 16 17  CREATININE 0.96 1.14*  CALCIUM  8.7* 9.0   GFR: Estimated Creatinine Clearance: 51.4 mL/min (A) (by C-G formula based on SCr of 1.14 mg/dL (H)). Recent Labs  Lab 01/13/24 0915 01/13/24 1601 01/13/24 1602  WBC 12.4* 10.9*  --   LATICACIDVEN  --   --  6.7*    Liver Function Tests: Recent Labs  Lab 01/13/24 0915 01/13/24 1601  AST 46* 230*  ALT 66* 114*  ALKPHOS 385* 693*  BILITOT 1.0 2.1*  PROT 6.5 7.2  ALBUMIN  2.7* 2.8*   Recent Labs  Lab 01/13/24 1601  LIPASE 12   No results for input(s): AMMONIA in the last 168 hours.  ABG No results found for: PHART, PCO2ART, PO2ART, HCO3, TCO2, ACIDBASEDEF, O2SAT   Coagulation Profile: No results for input(s): INR, PROTIME in the last 168 hours.  Cardiac Enzymes: No results for input(s): CKTOTAL, CKMB, CKMBINDEX, TROPONINI in the last 168 hours.  HbA1C: Hgb A1c MFr Bld  Date/Time Value Ref Range Status  11/23/2023 02:59 PM 10.1 (H) 4.6 - 6.5 % Final    Comment:    Glycemic Control Guidelines for People with Diabetes:Non Diabetic:  <6%Goal of Therapy: <7%Additional Action Suggested:  >8%   07/19/2023 03:41 PM 7.0 (H) 4.6 - 6.5 % Final    Comment:    Glycemic Control  Guidelines for People with Diabetes:Non Diabetic:  <6%Goal of Therapy: <7%Additional Action Suggested:  >8%     CBG: No results for input(s): GLUCAP in the last 168 hours.  Review of Systems:   Bolds are positive  Constitutional: weight loss, gain, night sweats, Fevers, chills, fatigue .  HEENT: headaches, Sore throat, sneezing, nasal congestion, post nasal drip, Difficulty swallowing, Tooth/dental problems, visual complaints visual changes, ear ache CV:  chest pain, radiates:,Orthopnea, PND, swelling in lower extremities, dizziness, palpitations, syncope.  GI  heartburn, indigestion, abdominal pain, nausea, vomiting, diarrhea, change in bowel habits, loss of appetite, bloody stools.  Resp: cough, productive: , hemoptysis, dyspnea, chest pain, pleuritic.  Skin: rash or itching or icterus GU: dysuria, change in color of urine, urgency or frequency. flank pain, hematuria  MS: joint pain or swelling. decreased range of motion  Psych: change in mood or affect. depression or anxiety.  Neuro: difficulty with speech, weakness, numbness, ataxia   Past Medical History:  She,  has a past medical history of Asthma, CAD (coronary artery disease) (11/07/2003), Cancer (HCC), Chicken pox, CVA (cerebrovascular accident) (HCC) (06/09/1999), Diabetes mellitus, Diastolic dysfunction, Empty sella syndrome (HCC), GERD (gastroesophageal reflux disease), Granulomatous disease, chronic (HCC), H/O  bilateral cataract extraction, Hyperlipidemia, Hypertension, NAFLD (nonalcoholic fatty liver disease), OA (osteoarthritis), and Reactive airway disease.   Surgical History:   Past Surgical History:  Procedure Laterality Date   BILIARY BRUSHING  12/08/2023   Procedure: BRUSH BIOPSY, BILE DUCT;  Surgeon: Rosalie Kitchens, MD;  Location: [facility] ENDOSCOPY;  Service: Gastroenterology;;   BILIARY STENT PLACEMENT  12/08/2023   Procedure: INSERTION, STENT, BILE DUCT;  Surgeon: Rosalie Kitchens, MD;  Location: [facility] ENDOSCOPY;  Service:  Gastroenterology;;   ORIN MEDIATE RELEASE  1992    left arm 1992   CHOLECYSTECTOMY  2003   ERCP N/A 12/08/2023   Procedure: ERCP, WITH INTERVENTION IF INDICATED;  Surgeon: Rosalie Kitchens, MD;  Location: [facility] ENDOSCOPY;  Service: Gastroenterology;  Laterality: N/A;   PORTACATH PLACEMENT Left 12/27/2023   Procedure: INSERTION, TUNNELED CENTRAL VENOUS DEVICE, WITH PORT;  Surgeon: Dasie Leonor CROME, MD;  Location: [facility] OR;  Service: General;  Laterality: Left;  PORT-A-CATH INSERTION   TONSILLECTOMY AND ADENOIDECTOMY       Social History:   reports that she has quit smoking. Her smoking use included cigarettes. She has a 1 pack-year smoking history. She has never used smokeless tobacco. She reports that she does not drink alcohol  and does not use drugs.   Family History:  Her family history includes Hypertension in her mother and sister; Lung cancer in her mother and sister.   Allergies Allergies  Allergen Reactions   Atorvastatin     REACTION: unable to tolerate. reason unknown   Clopidogrel Bisulfate     REACTION: weak, tired, sleepy, tightness (6/05); soreness in hip (11/05)   Crestor  [Rosuvastatin  Calcium ]     Did not like how she felt on it - stomach upset, nausea   Fexofenadine     REACTION: intolerance unknown   Furosemide     REACTION: did not tolerate and preferred HCTZ   Gabapentin      sob   Hydrochlorothiazide -Triamterene     REACTION: things crawled in her head and felt funny(10/04) ; h/a (3/07)   Lisinopril     REACTION: cough, right sided pain   Metoprolol Tartrate     REACTION: lightheadedness, h/a cough (7/04); heart gong to stop (7/06); sluggishnes and depressed feeling (2/07); weakness/heaviness 6/07)   Mometasone Furoate     REACTION: neck tenderness   Telmisartan      Shortness of breath, foot swelling   Ozempic  (0.25 Or 0.5 Mg-Dose) [Semaglutide (0.25 Or 0.5mg -Dos)]     Blurry vision     Home Medications  Prior to Admission medications   Medication Sig Start Date  End Date Taking? Authorizing Provider  albuterol  (VENTOLIN  HFA) 108 (90 Base) MCG/ACT inhaler INHALE 2 PUFFS INTO THE LUNGS EVERY 4 (FOUR) HOURS AS NEEDED FOR SHORTNESS OF BREATH. 03/12/23   Geofm Glade PARAS, MD  amLODipine  (NORVASC ) 10 MG tablet Take 1 tablet (10 mg total) by mouth daily. 03/12/23   Geofm Glade PARAS, MD  cyanocobalamin  (VITAMIN B12) 500 MCG tablet Take 500 mcg by mouth daily.    [provider]  fluticasone  (FLONASE ) 50 MCG/ACT nasal spray Place 2 sprays into both nostrils daily. 03/12/23   Geofm Glade PARAS, MD  glipiZIDE  (GLUCOTROL ) 10 MG tablet TAKE 2 TABLETS BY MOUTH 2 TIMES DAILY BEFORE A MEAL. 03/12/23   Geofm Glade PARAS, MD  hydrochlorothiazide  (HYDRODIURIL ) 25 MG tablet Take 1 tablet (25 mg total) by mouth daily. 11/23/23   Geofm Glade PARAS, MD  HYDROcodone -acetaminophen  (NORCO) 10-325 MG tablet Take 1-2 tablets by mouth every 4 (four) hours  as needed for moderate pain (pain score 4-6) or severe pain (pain score 7-10). 12/13/23   Patsy Lenis, MD  ibuprofen  (ADVIL ) 800 MG tablet TAKE 1 TABLET BY MOUTH TWICE DAILY AS NEEDED FOR MODERATE PAIN 11/30/23   Geofm Glade PARAS, MD  insulin  glargine (LANTUS ) 100 UNIT/ML Solostar Pen Inject 20 Units into the skin daily. 11/30/23   Geofm Glade PARAS, MD  lidocaine -prilocaine  (EMLA ) cream Apply 1 Application topically as needed (Apply to Port 1-2 hours prior to its use start using with 2nd treatment). Patient not taking: No sig reported 12/24/23   Cloretta Arley NOVAK, MD  linaclotide  (LINZESS ) 145 MCG CAPS capsule TAKE 1 CAPSULE BY MOUTH DAILY BEFORE BREAKFAST. Patient not taking: Reported on 12/07/2023 03/12/23   Geofm Glade PARAS, MD  metFORMIN  (GLUCOPHAGE -XR) 500 MG 24 hr tablet TAKE 2 TABLETS BY MOUTH TWICE DAILY BEFORE A MEAL Patient taking differently: Take 500 mg by mouth 2 (two) times daily with a meal. TAKE 2 TABLETS BY MOUTH TWICE DAILY BEFORE A MEAL 09/23/23   Burns, Glade PARAS, MD  montelukast  (SINGULAIR ) 10 MG tablet TAKE 1 TABLET BY MOUTH EVRY DAY AT  BEDTIME 09/23/23   Geofm Glade PARAS, MD  naloxone  (NARCAN ) nasal spray 4 mg/0.1 mL 1 spray in one nostril x1; may repeat dose in alternate nostrils q2-3min prn Patient not taking: No sig reported 03/07/23   Geofm Glade PARAS, MD  ondansetron  (ZOFRAN ) 8 MG tablet Take 1 tablet (8 mg total) by mouth every 8 (eight) hours as needed. Patient not taking: No sig reported 12/24/23   Cloretta Arley NOVAK, MD  potassium chloride  (MICRO-K ) 10 MEQ CR capsule TAKE 1 CAPSULE BY MOUTH TWICE A DAY 06/07/23   Geofm Glade PARAS, MD  prochlorperazine  (COMPAZINE ) 10 MG tablet Take 1 tablet (10 mg total) by mouth every 6 (six) hours as needed for nausea or vomiting. Patient not taking: No sig reported 12/24/23   Cloretta Arley NOVAK, MD     Critical care time: 42 minutes     Deward Eastern, AGACNP-BC [facility]  See Amion for personal pager PCCM on call pager [phone] until 7pm. Please call Elink 7p-7a. [phone]  01/13/2024 10:11 PM

## 2024-01-13 NOTE — Progress Notes (Signed)
 Pharmacy Antibiotic Note  Lindsay Hardy is a 73 y.o. female admitted on 01/13/2024 with sepsis.  Pharmacy has been consulted for Vancomycin  and Zosyn  dosing.  Pt received Cefepime  2gm and Vanc 1gm at [facility] emergency dept. Another Vanc 1gm IV ordered for now to complete load.  Plan: Zosyn  3.375gm IV q8h Vancomycin  1000 mg IV Q 24 hrs. Goal AUC 400-550. Expected AUC: 509 SCr used: 1.14 Vd coeff: 0.5 Will f/u renal function, micro data, and pt's clinical condition Vanc levels prn   Height: 5' 5 (165.1 cm) Weight: 106.8 kg (235 lb 7.2 oz) IBW/kg (Calculated) : 57  Temp (24hrs), Avg:99.8 F (37.7 C), Min:97.7 F (36.5 C), Max:103 F (39.4 C)  Recent Labs  Lab 01/13/24 0915 01/13/24 1601 01/13/24 1602 01/13/24 1802  WBC 12.4* 10.9*  --   --   CREATININE 0.96 1.14*  --   --   LATICACIDVEN  --   --  6.7* 4.9*    Estimated Creatinine Clearance: 52.6 mL/min (A) (by C-G formula based on SCr of 1.14 mg/dL (H)).    Allergies  Allergen Reactions   Atorvastatin     REACTION: unable to tolerate. reason unknown   Clopidogrel Bisulfate     REACTION: weak, tired, sleepy, tightness (6/05); soreness in hip (11/05)   Crestor  [Rosuvastatin  Calcium ]     Did not like how she felt on it - stomach upset, nausea   Fexofenadine     REACTION: intolerance unknown   Furosemide     REACTION: did not tolerate and preferred HCTZ   Gabapentin      sob   Hydrochlorothiazide -Triamterene     REACTION: things crawled in her head and felt funny(10/04) ; h/a (3/07)   Lisinopril     REACTION: cough, right sided pain   Metoprolol Tartrate     REACTION: lightheadedness, h/a cough (7/04); heart gong to stop (7/06); sluggishnes and depressed feeling (2/07); weakness/heaviness 6/07)   Mometasone Furoate     REACTION: neck tenderness   Telmisartan      Shortness of breath, foot swelling   Ozempic  (0.25 Or 0.5 Mg-Dose) [Semaglutide (0.25 Or 0.5mg -Dos)]     Blurry vision    Antimicrobials this admission: 8/7  Cefepime  x1 8/7 Zosyn  >>  8/7 Vanc >>   Microbiology results: 8/7 BCx:  8/7 MRSA PCR:   Thank you for allowing pharmacy to be a part of this patient's care.  Vito Ralph, PharmD, BCPS Please see amion for complete clinical pharmacist phone list 01/13/2024 10:20 PM

## 2024-01-13 NOTE — ED Provider Notes (Addendum)
 [place] EMERGENCY DEPARTMENT AT [facility] Provider Note   CSN: [id number] Arrival date & time: 01/13/24  1542     Patient presents with: Tachycardia   Lindsay Hardy is a 73 y.o. female.   Patient here from oncology clinic after developing tachycardia after finishing chemotherapy infusion today.  History of pancreatic cancer.  Seem to be doing well prior to her visit today per family but she was feeling kind of cold today.  After she finished her chemotherapy infusion she developed tachycardia.  Sent down to the ED for evaluation as heart rate was in the 150s.  She has a history of CAD CVA asthma diabetes hypertension high cholesterol this was her 2nd or 3rd round of this chemotherapy agent.  She is not having any major trouble breathing or chest pain or abdominal pain or nausea vomiting diarrhea.  She just feels very cold.  She is shivering.  The history is provided by the patient.       Prior to Admission medications   Medication Sig Start Date End Date Taking? Authorizing Provider  albuterol  (VENTOLIN  HFA) 108 (90 Base) MCG/ACT inhaler INHALE 2 PUFFS INTO THE LUNGS EVERY 4 (FOUR) HOURS AS NEEDED FOR SHORTNESS OF BREATH. 03/12/23   Geofm Glade PARAS, MD  amLODipine  (NORVASC ) 10 MG tablet Take 1 tablet (10 mg total) by mouth daily. 03/12/23   Geofm Glade PARAS, MD  cyanocobalamin  (VITAMIN B12) 500 MCG tablet Take 500 mcg by mouth daily.    [provider]  fluticasone  (FLONASE ) 50 MCG/ACT nasal spray Place 2 sprays into both nostrils daily. 03/12/23   Geofm Glade PARAS, MD  glipiZIDE  (GLUCOTROL ) 10 MG tablet TAKE 2 TABLETS BY MOUTH 2 TIMES DAILY BEFORE A MEAL. 03/12/23   Geofm Glade PARAS, MD  hydrochlorothiazide  (HYDRODIURIL ) 25 MG tablet Take 1 tablet (25 mg total) by mouth daily. 11/23/23   Geofm Glade PARAS, MD  HYDROcodone -acetaminophen  (NORCO) 10-325 MG tablet Take 1-2 tablets by mouth every 4 (four) hours as needed for moderate pain (pain score 4-6) or severe pain (pain score  7-10). 12/13/23   Patsy Lenis, MD  ibuprofen  (ADVIL ) 800 MG tablet TAKE 1 TABLET BY MOUTH TWICE DAILY AS NEEDED FOR MODERATE PAIN 11/30/23   Geofm Glade PARAS, MD  insulin  glargine (LANTUS ) 100 UNIT/ML Solostar Pen Inject 20 Units into the skin daily. 11/30/23   Geofm Glade PARAS, MD  lidocaine -prilocaine  (EMLA ) cream Apply 1 Application topically as needed (Apply to Port 1-2 hours prior to its use start using with 2nd treatment). Patient not taking: No sig reported 12/24/23   Cloretta Arley NOVAK, MD  linaclotide  (LINZESS ) 145 MCG CAPS capsule TAKE 1 CAPSULE BY MOUTH DAILY BEFORE BREAKFAST. Patient not taking: Reported on 12/07/2023 03/12/23   Geofm Glade PARAS, MD  metFORMIN  (GLUCOPHAGE -XR) 500 MG 24 hr tablet TAKE 2 TABLETS BY MOUTH TWICE DAILY BEFORE A MEAL Patient taking differently: Take 500 mg by mouth 2 (two) times daily with a meal. TAKE 2 TABLETS BY MOUTH TWICE DAILY BEFORE A MEAL 09/23/23   Burns, Glade PARAS, MD  montelukast  (SINGULAIR ) 10 MG tablet TAKE 1 TABLET BY MOUTH EVRY DAY AT BEDTIME 09/23/23   Geofm Glade PARAS, MD  naloxone  (NARCAN ) nasal spray 4 mg/0.1 mL 1 spray in one nostril x1; may repeat dose in alternate nostrils q2-3min prn Patient not taking: No sig reported 03/07/23   Geofm Glade PARAS, MD  ondansetron  (ZOFRAN ) 8 MG tablet Take 1 tablet (8 mg total) by mouth every 8 (eight) hours as needed.  Patient not taking: No sig reported 12/24/23   Cloretta Arley NOVAK, MD  potassium chloride  (MICRO-K ) 10 MEQ CR capsule TAKE 1 CAPSULE BY MOUTH TWICE A DAY 06/07/23   Geofm Glade PARAS, MD  prochlorperazine  (COMPAZINE ) 10 MG tablet Take 1 tablet (10 mg total) by mouth every 6 (six) hours as needed for nausea or vomiting. Patient not taking: No sig reported 12/24/23   Cloretta Arley NOVAK, MD    Allergies: Atorvastatin, Clopidogrel bisulfate, Crestor  [rosuvastatin  calcium ], Fexofenadine, Furosemide, Gabapentin , Hydrochlorothiazide -triamterene, Lisinopril, Metoprolol tartrate, Mometasone furoate, Telmisartan , and Ozempic   (0.25 or 0.5 mg-dose) [semaglutide (0.25 or 0.5mg -dos)]    Review of Systems  Updated Vital Signs BP 125/74   Pulse (!) 114   Temp (!) 100.8 F (38.2 C) (Rectal)   Resp (!) 35   SpO2 94%   Physical Exam Vitals and nursing note reviewed.  Constitutional:      General: She is not in acute distress.    Appearance: She is well-developed. She is ill-appearing.  HENT:     Head: Normocephalic and atraumatic.  Eyes:     Extraocular Movements: Extraocular movements intact.     Conjunctiva/sclera: Conjunctivae normal.     Pupils: Pupils are equal, round, and reactive to light.  Cardiovascular:     Rate and Rhythm: Regular rhythm. Tachycardia present.     Heart sounds: No murmur heard. Pulmonary:     Effort: Pulmonary effort is normal. No respiratory distress.     Breath sounds: Normal breath sounds.  Abdominal:     Palpations: Abdomen is soft.     Tenderness: There is no abdominal tenderness.  Musculoskeletal:        General: No swelling.     Cervical back: Neck supple.  Skin:    General: Skin is warm and dry.     Capillary Refill: Capillary refill takes less than 2 seconds.  Neurological:     General: No focal deficit present.     Mental Status: She is alert and oriented to person, place, and time.     Cranial Nerves: No cranial nerve deficit.     Sensory: No sensory deficit.     Motor: No weakness.     Coordination: Coordination normal.     Gait: Gait normal.     Comments: Patient is shivering but she appears to have normal strength and sensation throughout no neglect normal speech normal cranial nerves, she is slow to give answers little bit confused but is able to answer questions appropriately eventually participate in exam  Psychiatric:        Mood and Affect: Mood normal.     (all labs ordered are listed, but only abnormal results are displayed) Labs Reviewed  CBC WITH DIFFERENTIAL/PLATELET - Abnormal; Notable for the following components:      Result Value   WBC  10.9 (*)    Hemoglobin 11.5 (*)    MCV 75.9 (*)    MCH 24.1 (*)    RDW 17.1 (*)    Platelets 575 (*)    nRBC 1.7 (*)    Abs Immature Granulocytes 0.89 (*)    All other components within normal limits  COMPREHENSIVE METABOLIC PANEL WITH GFR - Abnormal; Notable for the following components:   Sodium 133 (*)    Chloride 94 (*)    CO2 18 (*)    Glucose, Bld 262 (*)    Creatinine, Ser 1.14 (*)    Albumin  2.8 (*)    AST 230 (*)    ALT 114 (*)  Alkaline Phosphatase 693 (*)    Total Bilirubin 2.1 (*)    GFR, Estimated 50 (*)    Anion gap 22 (*)    All other components within normal limits  LACTIC ACID, PLASMA - Abnormal; Notable for the following components:   Lactic Acid, Venous 6.7 (*)    All other components within normal limits  LACTIC ACID, PLASMA - Abnormal; Notable for the following components:   Lactic Acid, Venous 4.9 (*)    All other components within normal limits  TROPONIN T, HIGH SENSITIVITY - Abnormal; Notable for the following components:   Troponin T High Sensitivity 31 (*)    All other components within normal limits  TROPONIN T, HIGH SENSITIVITY - Abnormal; Notable for the following components:   Troponin T High Sensitivity 40 (*)    All other components within normal limits  RESP PANEL BY RT-PCR (RSV, FLU A&B, COVID)  RVPGX2  CULTURE, BLOOD (ROUTINE X 2)  CULTURE, BLOOD (ROUTINE X 2)  LIPASE, BLOOD  URINALYSIS, ROUTINE W REFLEX MICROSCOPIC    EKG: None  Radiology: CT Angio Chest PE W and/or Wo Contrast Result Date: 01/13/2024 CLINICAL DATA:  Chest and abdominal pain. Pancreatic adenocarcinoma. Concern for pulmonary embolism. EXAM: CT ANGIOGRAPHY CHEST CT ABDOMEN AND PELVIS WITH CONTRAST TECHNIQUE: Multidetector CT imaging of the chest was performed using the standard protocol during bolus administration of intravenous contrast. Multiplanar CT image reconstructions and MIPs were obtained to evaluate the vascular anatomy. Multidetector CT imaging of the  abdomen and pelvis was performed using the standard protocol during bolus administration of intravenous contrast. RADIATION DOSE REDUCTION: This exam was performed according to the departmental dose-optimization program which includes automated exposure control, adjustment of the mA and/or kV according to patient size and/or use of iterative reconstruction technique. CONTRAST:  OMNIPAQUE  IOHEXOL  350 MG/ML SOLN COMPARISON:  Chest CT dated 12/09/2023 and CT abdomen pelvis dated 12/07/2023. FINDINGS: Evaluation of this exam is limited due to respiratory motion. CTA CHEST FINDINGS Cardiovascular: There is no cardiomegaly or pericardial effusion. There is 3 vessel coronary vascular calcification. Mild atherosclerotic calcification of the thoracic aorta. No aneurysmal dilatation or dissection. Evaluation of the pulmonary arteries is limited due to respiratory motion and suboptimal visualization of the peripheral branches. No central pulmonary artery embolus identified. Mediastinum/Nodes: No hilar or mediastinal adenopathy. The esophagus and the thyroid  gland are grossly unremarkable. No mediastinal fluid collection. Right-sided Port-A-Cath tip close to the cavoatrial junction. Lungs/Pleura: Right suprahilar/apical scarring as seen on the prior CT. There is background of emphysema. Small scattered calcified granuloma. No focal consolidation, pleural effusion, or pneumothorax. The central airways are patent. Musculoskeletal: Degenerative changes of the spine. No acute osseous pathology. Review of the MIP images confirms the above findings. CT ABDOMEN and PELVIS FINDINGS No intra-abdominal free air or free fluid. Hepatobiliary: Fatty liver. Several rounded hypoenhancing areas within the liver measuring up to 3.3 x 2.8 cm may represent post treatment changes and areas of infarct. There is looping abscesses are less likely but not excluded. There is mild dilatation, post cholecystectomy. Interval placement of a Wallstent  in the common bile duct. There is mild pneumobilia. Pancreas: Pancreatic atrophy, progressed since the prior CT. Ill-defined hypoenhancing area in the uncinate process of the pancreas is poorly visualized and suboptimally evaluated due to respiratory motion. Spleen: Normal in size without focal abnormality. Adrenals/Urinary Tract: The adrenal glands are unremarkable. There is no hydronephrosis on either side there is symmetric enhancement and excretion of contrast by both kidneys. The visualized ureters  and urinary bladder appear unremarkable. Stomach/Bowel: Mild diffuse thickened appearance of the colon, likely related to underdistention. Colitis is less likely but not excluded clinical correlation is recommended. There is no bowel obstruction. The appendix is not visualized with certainty. No inflammatory changes identified in the right lower quadrant. Vascular/Lymphatic: Mild aortoiliac atherosclerotic disease. The IVC is unremarkable. No portal venous gas. No adenopathy. Reproductive: Calcified uterine fibroids. No suspicious adnexal masses. Other: None Musculoskeletal: Degenerative changes of the spine. No acute osseous pathology. Review of the MIP images confirms the above findings. IMPRESSION: 1. No acute intrathoracic pathology. No central pulmonary artery embolus identified. 2. Interval placement of a Wallstent in the common bile duct. 3. Fatty liver. Several rounded hypoenhancing areas within the liver may represent post treatment changes and areas of infarct. Abscesses are less likely but not excluded. 4. Mild diffuse thickened appearance of the colon, likely related to underdistention. Colitis is less likely but not excluded clinical correlation is recommended. No bowel obstruction. 5.  Aortic Atherosclerosis (ICD10-I70.0). Electronically Signed   By: Vanetta Chou M.D.   On: 01/13/2024 16:52   CT ABDOMEN PELVIS W CONTRAST Result Date: 01/13/2024 CLINICAL DATA:  Chest and abdominal pain. Pancreatic  adenocarcinoma. Concern for pulmonary embolism. EXAM: CT ANGIOGRAPHY CHEST CT ABDOMEN AND PELVIS WITH CONTRAST TECHNIQUE: Multidetector CT imaging of the chest was performed using the standard protocol during bolus administration of intravenous contrast. Multiplanar CT image reconstructions and MIPs were obtained to evaluate the vascular anatomy. Multidetector CT imaging of the abdomen and pelvis was performed using the standard protocol during bolus administration of intravenous contrast. RADIATION DOSE REDUCTION: This exam was performed according to the departmental dose-optimization program which includes automated exposure control, adjustment of the mA and/or kV according to patient size and/or use of iterative reconstruction technique. CONTRAST:  OMNIPAQUE  IOHEXOL  350 MG/ML SOLN COMPARISON:  Chest CT dated 12/09/2023 and CT abdomen pelvis dated 12/07/2023. FINDINGS: Evaluation of this exam is limited due to respiratory motion. CTA CHEST FINDINGS Cardiovascular: There is no cardiomegaly or pericardial effusion. There is 3 vessel coronary vascular calcification. Mild atherosclerotic calcification of the thoracic aorta. No aneurysmal dilatation or dissection. Evaluation of the pulmonary arteries is limited due to respiratory motion and suboptimal visualization of the peripheral branches. No central pulmonary artery embolus identified. Mediastinum/Nodes: No hilar or mediastinal adenopathy. The esophagus and the thyroid  gland are grossly unremarkable. No mediastinal fluid collection. Right-sided Port-A-Cath tip close to the cavoatrial junction. Lungs/Pleura: Right suprahilar/apical scarring as seen on the prior CT. There is background of emphysema. Small scattered calcified granuloma. No focal consolidation, pleural effusion, or pneumothorax. The central airways are patent. Musculoskeletal: Degenerative changes of the spine. No acute osseous pathology. Review of the MIP images confirms the above findings. CT  ABDOMEN and PELVIS FINDINGS No intra-abdominal free air or free fluid. Hepatobiliary: Fatty liver. Several rounded hypoenhancing areas within the liver measuring up to 3.3 x 2.8 cm may represent post treatment changes and areas of infarct. There is looping abscesses are less likely but not excluded. There is mild dilatation, post cholecystectomy. Interval placement of a Wallstent in the common bile duct. There is mild pneumobilia. Pancreas: Pancreatic atrophy, progressed since the prior CT. Ill-defined hypoenhancing area in the uncinate process of the pancreas is poorly visualized and suboptimally evaluated due to respiratory motion. Spleen: Normal in size without focal abnormality. Adrenals/Urinary Tract: The adrenal glands are unremarkable. There is no hydronephrosis on either side there is symmetric enhancement and excretion of contrast by both kidneys. The visualized  ureters and urinary bladder appear unremarkable. Stomach/Bowel: Mild diffuse thickened appearance of the colon, likely related to underdistention. Colitis is less likely but not excluded clinical correlation is recommended. There is no bowel obstruction. The appendix is not visualized with certainty. No inflammatory changes identified in the right lower quadrant. Vascular/Lymphatic: Mild aortoiliac atherosclerotic disease. The IVC is unremarkable. No portal venous gas. No adenopathy. Reproductive: Calcified uterine fibroids. No suspicious adnexal masses. Other: None Musculoskeletal: Degenerative changes of the spine. No acute osseous pathology. Review of the MIP images confirms the above findings. IMPRESSION: 1. No acute intrathoracic pathology. No central pulmonary artery embolus identified. 2. Interval placement of a Wallstent in the common bile duct. 3. Fatty liver. Several rounded hypoenhancing areas within the liver may represent post treatment changes and areas of infarct. Abscesses are less likely but not excluded. 4. Mild diffuse thickened  appearance of the colon, likely related to underdistention. Colitis is less likely but not excluded clinical correlation is recommended. No bowel obstruction. 5.  Aortic Atherosclerosis (ICD10-I70.0). Electronically Signed   By: Vanetta Chou M.D.   On: 01/13/2024 16:52   CT Head Wo Contrast Result Date: 01/13/2024 CLINICAL DATA:  Altered level of consciousness, pancreatic cancer EXAM: CT HEAD WITHOUT CONTRAST TECHNIQUE: Contiguous axial images were obtained from the base of the skull through the vertex without intravenous contrast. RADIATION DOSE REDUCTION: This exam was performed according to the departmental dose-optimization program which includes automated exposure control, adjustment of the mA and/or kV according to patient size and/or use of iterative reconstruction technique. COMPARISON:  None Available. FINDINGS: Brain: There are indeterminate hypodensities within the right frontal subcortical white matter, right parietal and occipital periventricular white matter, and left occipital cortex, most consistent with age indeterminate ischemic changes. Chronic appearing ischemic changes are seen within the cerebellar hemispheres and left occipital cortex. No evidence of acute hemorrhage. Lateral ventricles and remaining midline structures are unremarkable. No acute extra-axial fluid collections. No mass effect. Vascular: No hyperdense vessel or unexpected calcification. Diffuse atherosclerosis. Skull: Normal. Negative for fracture or focal lesion. Sinuses/Orbits: No acute finding. Other: None. IMPRESSION: 1. Indeterminate hypodensities within the right frontal, parietal, and occipital white matter as above, favor age indeterminate small vessel ischemic changes. 2. Chronic appearing ischemic changes within the cerebellar hemispheres and left occipital cortex. 3. No evidence of acute hemorrhage. Electronically Signed   By: Ozell Daring M.D.   On: 01/13/2024 16:49     .Critical Care  Performed by:  Ruthe Cornet, DO Authorized by: Ruthe Cornet, DO   Critical care provider statement:    Critical care time (minutes):  45   Critical care was necessary to treat or prevent imminent or life-threatening deterioration of the following conditions:  Sepsis and shock   Critical care was time spent personally by me on the following activities:  Development of treatment plan with patient or surrogate, blood draw for specimens, discussions with consultants, evaluation of patient's response to treatment, examination of patient, obtaining history from patient or surrogate, ordering and performing treatments and interventions, ordering and review of laboratory studies, ordering and review of radiographic studies, pulse oximetry, re-evaluation of patient's condition and review of old charts   I assumed direction of critical care for this patient from another provider in my specialty: no      Medications Ordered in the ED  vancomycin  (VANCOCIN ) IVPB 1000 mg/200 mL premix (1,000 mg Intravenous New Bag/Given 01/13/24 1901)    Followed by  vancomycin  (VANCOCIN ) IVPB 1000 mg/200 mL premix (has no administration  in time range)  norepinephrine  (LEVOPHED ) 4mg  in (0.016 mg/mL) premix infusion (13 mcg/min Intravenous Rate/Dose Change 01/13/24 2037)  sodium chloride  0.9 % bolus 1,000 mL (0 mLs Intravenous Stopped 01/13/24 1816)  fentaNYL  (SUBLIMAZE ) injection 50 mcg (50 mcg Intravenous Given 01/13/24 1615)  iohexol  (OMNIPAQUE ) 350 MG/ML injection 100 mL (100 mLs Intravenous Contrast Given 01/13/24 1635)  ceFEPIme  (MAXIPIME ) 2 g in sodium chloride  0.9 % 100 mL IVPB (0 g Intravenous Stopped 01/13/24 1817)  sodium chloride  0.9 % bolus 2,000 mL (0 mLs Intravenous Stopped 01/13/24 1902)  acetaminophen  (TYLENOL ) tablet 1,000 mg (1,000 mg Oral Given 01/13/24 1735)  metroNIDAZOLE  (FLAGYL ) IVPB 500 mg (0 mg Intravenous Stopped 01/13/24 1901)  0.9 %  sodium chloride  infusion ( Intravenous New Bag/Given 01/13/24 1752)  sodium chloride  0.9  % bolus 1,000 mL (0 mLs Intravenous Stopped 01/13/24 1957)  LORazepam  (ATIVAN ) tablet 0.5 mg (0.5 mg Oral Given 01/13/24 2034)                                    Medical Decision Making Amount and/or Complexity of Data Reviewed Labs: ordered. Radiology: ordered.  Risk OTC drugs. Prescription drug management. Decision regarding hospitalization.   Lindsay Hardy is here with tachycardia, altered mental status.  History of pancreatic cancer just had chemotherapy prior to arrival here.  History of stroke hypertension high cholesterol.  She has had several chemotherapy treatments just finishing 1 prior to arrival here.  Shortly after she finished her infusion she developed tachycardia little bit of confusion.  She is neurologically intact on exam but she is slow to answer questions keeps talking about being cold and shivering.  Her temperature is 98.1 here but she is tachypneic she is tachycardic.  I do not think there is anaphylaxis.  She has had this chemotherapy before.  But of not sure if maybe this is some sort of chemotherapy reaction or may be the start of an infectious process.  Per family she was feeling cold and chills prior to infusion today.  EKG looks to be a sinus tachycardia.  She is tachypneic.  But she denies any chest pain shortness of breath abdominal pain.  She denies any headache.  Her neurological exam is nonfocal.  I do not think that this is a stroke but overall we will evaluate with a CT scan of her head chest abdomen pelvis will do infectious workup with blood cultures lactic acid basic labs.  Will hold on antibiotics at this time.  Lactic acid came back at 6.7.  Rectal temperature was 103.  Antibiotics were started empirically for concern for now code sepsis.  White count was 10.9.  Liver enzymes elevated from baseline with AST of 230 ALT of 114 alk phos of 693 bilirubin of 2.1.  Anion gap 22 likely from lactic acidosis.  Troponin 31 likely from demand.  CT scan of the head  showed age-indeterminate small vessel ischemic changes.  No head bleed.  CT scan of the chest abdomen pelvis showed may be colitis.  May be some hypoenhancing areas around the liver.  I talked with Dr. Tanda with general surgery.  Lower concern for abscesses in the liver.  But possibly concerned that there could be issue with her common bile duct stent.  This looks like this was done by [facility] Dr. Rosalie.  I will talk with the [facility] GI team.  She has been given vancomycin  and cefepime  Flagyl   for antibiotics.  Talked with Dr. Kriss with [facility] they will see the patient in the morning.  Keep n.p.o. at midnight.  May need to do ERCP if liver enzymes continue to trend up making this more suggestive of cholangitis.  Ultimately blood pressure is in the 90s.  Heart rate still in the 140s.  Will let her finish her fluid boluses repeat lactic acid and see if she stable to go to stepdown or if she will need ICU.  Overall despite 3 L of IV fluids blood pressure has dropped below 90 we will start her on Levophed .  Have ordered an additional fluid bolus.  Heart rate has improved however to the 120s.  Will talk to intensivist about admission.  Patient's mentation is stable.  No urine output at this time.  Nursing staff having issues accessing her port.  They drew back a little bit of yellow material from the port which I suspect might be from the chemotherapy she got today.  It is possible that her port was not the accessed correctly today given that they are trying to get her down to the ED quickly.  Ultimately was going to try to do some tPA/Cathflo to the port but transport team showed up.  I made the ICU team aware of this and will transfer and let their ICU team troubleshoot the port.  Right now she is stable on 11 of Levophed  perpherially  This chart was dictated using voice recognition software.  Despite best efforts to proofread,  errors can occur which can change the documentation meaning.      Final  diagnoses:  Sepsis, due to unspecified organism, unspecified whether acute organ dysfunction present [facility])  [facility])    ED Discharge Orders     None          Ruthe Cornet, DO 01/13/24 1903    Ruthe Cornet, DO 01/13/24 2018    Ruthe Cornet, DO 01/13/24 2056

## 2024-01-13 NOTE — Patient Instructions (Signed)
 [facility] - A DEPT OF [facility]  Discharge Instructions: Thank you for choosing [facility] to provide your oncology and hematology care.   If you have a lab appointment with the Cancer Center, please go directly to the Cancer Center and check in at the registration area.   Wear comfortable clothing and clothing appropriate for easy access to any Portacath or PICC line.   We strive to give you quality time with your provider. You may need to reschedule your appointment if you arrive late (15 or more minutes).  Arriving late affects you and other patients whose appointments are after yours.  Also, if you miss three or more appointments without notifying the office, you may be dismissed from the clinic at the provider's discretion.      For prescription refill requests, have your pharmacy contact our office and allow 72 hours for refills to be completed.    Today you received the following chemotherapy and/or immunotherapy agents: Abraxane  and Gemcitabine  (Gemzar )      To help prevent nausea and vomiting after your treatment, we encourage you to take your nausea medication as directed.  BELOW ARE SYMPTOMS THAT SHOULD BE REPORTED IMMEDIATELY: *FEVER GREATER THAN 100.4 F (38 C) OR HIGHER *CHILLS OR SWEATING *NAUSEA AND VOMITING THAT IS NOT CONTROLLED WITH YOUR NAUSEA MEDICATION *UNUSUAL SHORTNESS OF BREATH *UNUSUAL BRUISING OR BLEEDING *URINARY PROBLEMS (pain or burning when urinating, or frequent urination) *BOWEL PROBLEMS (unusual diarrhea, constipation, pain near the anus) TENDERNESS IN MOUTH AND THROAT WITH OR WITHOUT PRESENCE OF ULCERS (sore throat, sores in mouth, or a toothache) UNUSUAL RASH, SWELLING OR PAIN  UNUSUAL VAGINAL DISCHARGE OR ITCHING   Items with * indicate a potential emergency and should be followed up as soon as possible or go to the Emergency Department if any problems should occur.  Please show the CHEMOTHERAPY  ALERT CARD or IMMUNOTHERAPY ALERT CARD at check-in to the Emergency Department and triage nurse.  Should you have questions after your visit or need to cancel or reschedule your appointment, please contact [facility] - A DEPT OF [facility]  Dept: [phone]  and follow the prompts.  Office hours are 8:00 a.m. to 4:30 p.m. Monday - Friday. Please note that voicemails left after 4:00 p.m. may not be returned until the following business day.  We are closed weekends and major holidays. You have access to a nurse at all times for urgent questions. Please call the main number to the clinic Dept: [phone] and follow the prompts.   For any non-urgent questions, you may also contact your provider using MyChart. We now offer e-Visits for anyone 16 and older to request care online for non-urgent symptoms. For details visit [email or web address].   Also download the MyChart app! Go to the app store, search MyChart, open the app, select [facility], and log in with your MyChart username and password.  Paclitaxel  Nanoparticle Albumin-Bound Injection What is this medication? NANOPARTICLE ALBUMIN-BOUND PACLITAXEL  (Na no PAHR ti kuhl al BYOO muhn-bound PAK li TAX el) treats some types of cancer. It works by slowing down the growth of cancer cells. This medicine may be used for other purposes; ask your health care provider or pharmacist if you have questions. COMMON BRAND NAME(S): Abraxane  What should I tell my care team before I take this medication? They need to know if you have any of these conditions: Liver disease Low white blood cell levels An unusual  or allergic reaction to paclitaxel , albumin, other medications, foods, dyes, or preservatives If you or your partner are pregnant or trying to get pregnant Breast-feeding How should I use this medication? This medication is injected into a vein. It is given by your care team in a hospital or clinic setting. Talk  to your care team about the use of this medication in children. Special care may be needed. Overdosage: If you think you have taken too much of this medicine contact a poison control center or emergency room at once. NOTE: This medicine is only for you. Do not share this medicine with others. What if I miss a dose? Keep appointments for follow-up doses. It is important not to miss your dose. Call your care team if you are unable to keep an appointment. What may interact with this medication? Other medications may affect the way this medication works. Talk with your care team about all of the medications you take. They may suggest changes to your treatment plan to lower the risk of side effects and to make sure your medications work as intended. This list may not describe all possible interactions. Give your health care provider a list of all the medicines, herbs, non-prescription drugs, or dietary supplements you use. Also tell them if you smoke, drink alcohol , or use illegal drugs. Some items may interact with your medicine. What should I watch for while using this medication? Your condition will be monitored carefully while you are receiving this medication. You may need blood work while taking this medication. This medication may make you feel generally unwell. This is not uncommon as chemotherapy can affect healthy cells as well as cancer cells. Report any side effects. Continue your course of treatment even though you feel ill unless your care team tells you to stop. This medication can cause serious allergic reactions. To reduce the risk, your care team may give you other medications to take before receiving this one. Be sure to follow the directions from your care team. This medication may increase your risk of getting an infection. Call your care team for advice if you get a fever, chills, sore throat, or other symptoms of a cold or flu. Do not treat yourself. Try to avoid being around people who  are sick. This medication may increase your risk to bruise or bleed. Call your care team if you notice any unusual bleeding. Be careful brushing or flossing your teeth or using a toothpick because you may get an infection or bleed more easily. If you have any dental work done, tell your dentist you are receiving this medication. Talk to your care team if you or your partner may be pregnant. Serious birth defects can occur if you take this medication during pregnancy and for 6 months after the last dose. You will need a negative pregnancy test before starting this medication. Contraception is recommended while taking this medication and for 6 months after the last dose. Your care team can help you find the option that works for you. If your partner can get pregnant, use a condom during sex while taking this medication and for 3 months after the last dose. Do not breastfeed while taking this medication and for 2 weeks after the last dose. This medication may cause infertility. Talk to your care team if you are concerned about your fertility. What side effects may I notice from receiving this medication? Side effects that you should report to your care team as soon as possible: Allergic reactions--skin rash,  itching, hives, swelling of the face, lips, tongue, or throat Dry cough, shortness of breath or trouble breathing Infection--fever, chills, cough, sore throat, wounds that don't heal, pain or trouble when passing urine, general feeling of discomfort or being unwell Low red blood cell level--unusual weakness or fatigue, dizziness, headache, trouble breathing Pain, tingling, or numbness in the hands or feet Stomach pain, unusual weakness or fatigue, nausea, vomiting, diarrhea, or fever that lasts longer than expected Unusual bruising or bleeding Side effects that usually do not require medical attention (report to your care team if they continue or are bothersome): Diarrhea Fatigue Hair loss Loss  of appetite Nausea Vomiting This list may not describe all possible side effects. Call your doctor for medical advice about side effects. You may report side effects to FDA at 1-800-FDA-1088. Where should I keep my medication? This medication is given in a hospital or clinic. It will not be stored at home. NOTE: This sheet is a summary. It may not cover all possible information. If you have questions about this medicine, talk to your doctor, pharmacist, or health care provider.  2024 Elsevier/Gold Standard (2021-10-09 00:00:00)  Gemcitabine  Injection What is this medication? GEMCITABINE  (jem SYE ta been) treats some types of cancer. It works by slowing down the growth of cancer cells. This medicine may be used for other purposes; ask your health care provider or pharmacist if you have questions. COMMON BRAND NAME(S): Gemzar , Infugem  What should I tell my care team before I take this medication? They need to know if you have any of these conditions: Blood disorders Infection Kidney disease Liver disease Lung or breathing disease, such as asthma or COPD Recent or ongoing radiation therapy An unusual or allergic reaction to gemcitabine , other medications, foods, dyes, or preservatives If you or your partner are pregnant or trying to get pregnant Breast-feeding How should I use this medication? This medication is injected into a vein. It is given by your care team in a hospital or clinic setting. Talk to your care team about the use of this medication in children. Special care may be needed. Overdosage: If you think you have taken too much of this medicine contact a poison control center or emergency room at once. NOTE: This medicine is only for you. Do not share this medicine with others. What if I miss a dose? Keep appointments for follow-up doses. It is important not to miss your dose. Call your care team if you are unable to keep an appointment. What may interact with this  medication? Interactions have not been studied. This list may not describe all possible interactions. Give your health care provider a list of all the medicines, herbs, non-prescription drugs, or dietary supplements you use. Also tell them if you smoke, drink alcohol , or use illegal drugs. Some items may interact with your medicine. What should I watch for while using this medication? Your condition will be monitored carefully while you are receiving this medication. This medication may make you feel generally unwell. This is not uncommon, as chemotherapy can affect healthy cells as well as cancer cells. Report any side effects. Continue your course of treatment even though you feel ill unless your care team tells you to stop. In some cases, you may be given additional medications to help with side effects. Follow all directions for their use. This medication may increase your risk of getting an infection. Call your care team for advice if you get a fever, chills, sore throat, or other symptoms of a  cold or flu. Do not treat yourself. Try to avoid being around people who are sick. This medication may increase your risk to bruise or bleed. Call your care team if you notice any unusual bleeding. Be careful brushing or flossing your teeth or using a toothpick because you may get an infection or bleed more easily. If you have any dental work done, tell your dentist you are receiving this medication. Avoid taking medications that contain aspirin , acetaminophen , ibuprofen , naproxen , or ketoprofen unless instructed by your care team. These medications may hide a fever. Talk to your care team if you or your partner wish to become pregnant or think you might be pregnant. This medication can cause serious birth defects if taken during pregnancy and for 6 months after the last dose. A negative pregnancy test is required before starting this medication. A reliable form of contraception is recommended while taking  this medication and for 6 months after the last dose. Talk to your care team about effective forms of contraception. Do not father a child while taking this medication and for 3 months after the last dose. Use a condom while having sex during this time period. Do not breastfeed while taking this medication and for at least 1 week after the last dose. This medication may cause infertility. Talk to your care team if you are concerned about your fertility. What side effects may I notice from receiving this medication? Side effects that you should report to your care team as soon as possible: Allergic reactions--skin rash, itching, hives, swelling of the face, lips, tongue, or throat Capillary leak syndrome--stomach or muscle pain, unusual weakness or fatigue, feeling faint or lightheaded, decrease in the amount of urine, swelling of the ankles, hands, or feet, trouble breathing Infection--fever, chills, cough, sore throat, wounds that don't heal, pain or trouble when passing urine, general feeling of discomfort or being unwell Liver injury--right upper belly pain, loss of appetite, nausea, light-colored stool, dark yellow or brown urine, yellowing skin or eyes, unusual weakness or fatigue Low red blood cell level--unusual weakness or fatigue, dizziness, headache, trouble breathing Lung injury--shortness of breath or trouble breathing, cough, spitting up blood, chest pain, fever Stomach pain, bloody diarrhea, pale skin, unusual weakness or fatigue, decrease in the amount of urine, which may be signs of hemolytic uremic syndrome Sudden and severe headache, confusion, change in vision, seizures, which may be signs of posterior reversible encephalopathy syndrome (PRES) Unusual bruising or bleeding Side effects that usually do not require medical attention (report to your care team if they continue or are bothersome): Diarrhea Drowsiness Hair loss Nausea Pain, redness, or swelling with sores inside the  mouth or throat Vomiting This list may not describe all possible side effects. Call your doctor for medical advice about side effects. You may report side effects to FDA at 1-800-FDA-1088. Where should I keep my medication? This medication is given in a hospital or clinic. It will not be stored at home. NOTE: This sheet is a summary. It may not cover all possible information. If you have questions about this medicine, talk to your doctor, pharmacist, or health care provider.  2024 Elsevier/Gold Standard (2021-09-30 00:00:00)

## 2024-01-13 NOTE — ED Notes (Signed)
 Levo left AC.... NS left AC... Vanc Right AC... 2000 NS bolus Right AC.SABRASABRA

## 2024-01-13 NOTE — ED Triage Notes (Signed)
 Tachycardia post CA treatment Neuro changes, not answering while in transport to ed

## 2024-01-13 NOTE — Progress Notes (Signed)
 Obtained post-treatment vitals and heart rate is regular at 144. Checked apically to verify machine reading. Pt denies chest pain, ShOB or dizziness but can tell that her heart rate is fast. Says it happens occasionally. Notified provider Olam Ned, NP who ordered an EKG and came to see the patient.   B/P rechecked with a manual cuff- 138/78.  1530: Provider in to speak with pt and advised that pt needs to go to the ED for assessment. Another nurse and I assisted pt into a wheelchair and she stopped responding appropriately. She would nod to her name being called and mumbled something unintelligible. She remained awake the entire time. Dr. Cloretta back to assess the pt. She did answer some of his questions although slowly. Vitals rechecked (see flowsheet). The only abnormal was pt's elevated heart rate. Pt's son Koren is present and Dr. Cloretta advised them both that she needed to be evaluated in the ED. Pt transported via wheelchair to ED downstairs in our facility with son at her side. Geofm GRADE and Crystal S accompanied the patient to ED. See addendum to Olam Roach progress note from today.

## 2024-01-14 ENCOUNTER — Inpatient Hospital Stay (HOSPITAL_COMMUNITY): Admitting: Registered Nurse

## 2024-01-14 ENCOUNTER — Telehealth: Payer: Self-pay | Admitting: *Deleted

## 2024-01-14 ENCOUNTER — Inpatient Hospital Stay (HOSPITAL_COMMUNITY)

## 2024-01-14 ENCOUNTER — Other Ambulatory Visit: Payer: Self-pay

## 2024-01-14 ENCOUNTER — Encounter: Payer: Self-pay | Admitting: *Deleted

## 2024-01-14 ENCOUNTER — Encounter (HOSPITAL_COMMUNITY): Admission: EM | Disposition: E | Payer: Self-pay | Source: Ambulatory Visit

## 2024-01-14 DIAGNOSIS — C259 Malignant neoplasm of pancreas, unspecified: Secondary | ICD-10-CM

## 2024-01-14 DIAGNOSIS — A4151 Sepsis due to Escherichia coli [E. coli]: Secondary | ICD-10-CM

## 2024-01-14 DIAGNOSIS — I10 Essential (primary) hypertension: Secondary | ICD-10-CM

## 2024-01-14 DIAGNOSIS — R578 Other shock: Secondary | ICD-10-CM | POA: Diagnosis not present

## 2024-01-14 DIAGNOSIS — I2510 Atherosclerotic heart disease of native coronary artery without angina pectoris: Secondary | ICD-10-CM | POA: Diagnosis not present

## 2024-01-14 DIAGNOSIS — A499 Bacterial infection, unspecified: Secondary | ICD-10-CM | POA: Diagnosis present

## 2024-01-14 DIAGNOSIS — A419 Sepsis, unspecified organism: Secondary | ICD-10-CM | POA: Diagnosis not present

## 2024-01-14 DIAGNOSIS — Z87891 Personal history of nicotine dependence: Secondary | ICD-10-CM

## 2024-01-14 DIAGNOSIS — R7881 Bacteremia: Secondary | ICD-10-CM | POA: Diagnosis present

## 2024-01-14 DIAGNOSIS — K8309 Other cholangitis: Secondary | ICD-10-CM

## 2024-01-14 DIAGNOSIS — A4181 Sepsis due to Enterococcus: Secondary | ICD-10-CM

## 2024-01-14 DIAGNOSIS — R6521 Severe sepsis with septic shock: Secondary | ICD-10-CM | POA: Diagnosis not present

## 2024-01-14 DIAGNOSIS — A498 Other bacterial infections of unspecified site: Secondary | ICD-10-CM

## 2024-01-14 LAB — POCT I-STAT, CHEM 8
BUN: 13 mg/dL (ref 8–23)
Calcium, Ion: 1 mmol/L — ABNORMAL LOW (ref 1.15–1.40)
Chloride: 101 mmol/L (ref 98–111)
Creatinine, Ser: 1 mg/dL (ref 0.44–1.00)
Glucose, Bld: 108 mg/dL — ABNORMAL HIGH (ref 70–99)
HCT: 26 % — ABNORMAL LOW (ref 36.0–46.0)
Hemoglobin: 8.8 g/dL — ABNORMAL LOW (ref 12.0–15.0)
Potassium: 3.4 mmol/L — ABNORMAL LOW (ref 3.5–5.1)
Sodium: 136 mmol/L (ref 135–145)
TCO2: 19 mmol/L — ABNORMAL LOW (ref 22–32)

## 2024-01-14 LAB — POCT I-STAT 7, (LYTES, BLD GAS, ICA,H+H)
Acid-base deficit: 4 mmol/L — ABNORMAL HIGH (ref 0.0–2.0)
Bicarbonate: 22 mmol/L (ref 20.0–28.0)
Calcium, Ion: 0.96 mmol/L — ABNORMAL LOW (ref 1.15–1.40)
HCT: 24 % — ABNORMAL LOW (ref 36.0–46.0)
Hemoglobin: 8.2 g/dL — ABNORMAL LOW (ref 12.0–15.0)
O2 Saturation: 100 %
Potassium: 4.5 mmol/L (ref 3.5–5.1)
Sodium: 135 mmol/L (ref 135–145)
TCO2: 23 mmol/L (ref 22–32)
pCO2 arterial: 44.3 mmHg (ref 32–48)
pH, Arterial: 7.305 — ABNORMAL LOW (ref 7.35–7.45)
pO2, Arterial: 377 mmHg — ABNORMAL HIGH (ref 83–108)

## 2024-01-14 LAB — CBC
HCT: 23.9 % — ABNORMAL LOW (ref 36.0–46.0)
Hemoglobin: 7.7 g/dL — ABNORMAL LOW (ref 12.0–15.0)
MCH: 24.5 pg — ABNORMAL LOW (ref 26.0–34.0)
MCHC: 32.2 g/dL (ref 30.0–36.0)
MCV: 76.1 fL — ABNORMAL LOW (ref 80.0–100.0)
Platelets: 296 K/uL (ref 150–400)
RBC: 3.14 MIL/uL — ABNORMAL LOW (ref 3.87–5.11)
RDW: 17.2 % — ABNORMAL HIGH (ref 11.5–15.5)
WBC: 27.7 K/uL — ABNORMAL HIGH (ref 4.0–10.5)
nRBC: 0.3 % — ABNORMAL HIGH (ref 0.0–0.2)

## 2024-01-14 LAB — COMPREHENSIVE METABOLIC PANEL WITH GFR
ALT: 141 U/L — ABNORMAL HIGH (ref 0–44)
AST: 354 U/L — ABNORMAL HIGH (ref 15–41)
Albumin: <1.5 g/dL — ABNORMAL LOW (ref 3.5–5.0)
Alkaline Phosphatase: 331 U/L — ABNORMAL HIGH (ref 38–126)
Anion gap: 18 — ABNORMAL HIGH (ref 5–15)
BUN: 13 mg/dL (ref 8–23)
CO2: 14 mmol/L — ABNORMAL LOW (ref 22–32)
Calcium: 6.5 mg/dL — ABNORMAL LOW (ref 8.9–10.3)
Chloride: 99 mmol/L (ref 98–111)
Creatinine, Ser: 1.28 mg/dL — ABNORMAL HIGH (ref 0.44–1.00)
GFR, Estimated: 44 mL/min — ABNORMAL LOW (ref >60)
Glucose, Bld: 327 mg/dL — ABNORMAL HIGH (ref 70–99)
Potassium: 2.7 mmol/L — CL (ref 3.5–5.1)
Sodium: 131 mmol/L — ABNORMAL LOW (ref 135–145)
Total Bilirubin: 2.8 mg/dL — ABNORMAL HIGH (ref 0.0–1.2)
Total Protein: 5.4 g/dL — ABNORMAL LOW (ref 6.5–8.1)

## 2024-01-14 LAB — PHOSPHORUS: Phosphorus: 3.8 mg/dL (ref 2.5–4.6)

## 2024-01-14 LAB — BASIC METABOLIC PANEL WITH GFR
Anion gap: 14 (ref 5–15)
BUN: 13 mg/dL (ref 8–23)
CO2: 18 mmol/L — ABNORMAL LOW (ref 22–32)
Calcium: 7 mg/dL — ABNORMAL LOW (ref 8.9–10.3)
Chloride: 101 mmol/L (ref 98–111)
Creatinine, Ser: 0.95 mg/dL (ref 0.44–1.00)
GFR, Estimated: >60 mL/min (ref >60)
Glucose, Bld: 138 mg/dL — ABNORMAL HIGH (ref 70–99)
Potassium: 4.2 mmol/L (ref 3.5–5.1)
Sodium: 133 mmol/L — ABNORMAL LOW (ref 135–145)

## 2024-01-14 LAB — ECHOCARDIOGRAM COMPLETE
Height: 65 in
S' Lateral: 2.8 cm
Weight: 3781.33 [oz_av]

## 2024-01-14 LAB — BLOOD CULTURE ID PANEL (REFLEXED) - BCID2
A.calcoaceticus-baumannii: NOT DETECTED
Bacteroides fragilis: NOT DETECTED
CTX-M ESBL: NOT DETECTED
Candida albicans: NOT DETECTED
Candida auris: NOT DETECTED
Candida glabrata: NOT DETECTED
Candida krusei: NOT DETECTED
Candida parapsilosis: NOT DETECTED
Candida tropicalis: NOT DETECTED
Carbapenem resist OXA 48 LIKE: NOT DETECTED
Carbapenem resistance IMP: NOT DETECTED
Carbapenem resistance KPC: NOT DETECTED
Carbapenem resistance NDM: NOT DETECTED
Carbapenem resistance VIM: NOT DETECTED
Cryptococcus neoformans/gattii: NOT DETECTED
Enterobacter cloacae complex: NOT DETECTED
Enterobacterales: DETECTED — AB
Enterococcus Faecium: NOT DETECTED
Enterococcus faecalis: DETECTED — AB
Escherichia coli: DETECTED — AB
Haemophilus influenzae: NOT DETECTED
Klebsiella aerogenes: NOT DETECTED
Klebsiella oxytoca: NOT DETECTED
Klebsiella pneumoniae: NOT DETECTED
Listeria monocytogenes: NOT DETECTED
Neisseria meningitidis: NOT DETECTED
Proteus species: NOT DETECTED
Pseudomonas aeruginosa: NOT DETECTED
Salmonella species: NOT DETECTED
Serratia marcescens: NOT DETECTED
Staphylococcus aureus (BCID): NOT DETECTED
Staphylococcus epidermidis: NOT DETECTED
Staphylococcus lugdunensis: NOT DETECTED
Staphylococcus species: NOT DETECTED
Stenotrophomonas maltophilia: NOT DETECTED
Streptococcus agalactiae: NOT DETECTED
Streptococcus pneumoniae: NOT DETECTED
Streptococcus pyogenes: NOT DETECTED
Streptococcus species: NOT DETECTED
Vancomycin resistance: NOT DETECTED

## 2024-01-14 LAB — URINALYSIS, ROUTINE W REFLEX MICROSCOPIC
Bilirubin Urine: NEGATIVE
Glucose, UA: 50 mg/dL — AB
Hgb urine dipstick: NEGATIVE
Ketones, ur: NEGATIVE mg/dL
Leukocytes,Ua: NEGATIVE
Nitrite: NEGATIVE
Protein, ur: 30 mg/dL — AB
Specific Gravity, Urine: >1.046 — ABNORMAL HIGH (ref 1.005–1.030)
pH: 5 (ref 5.0–8.0)

## 2024-01-14 LAB — PROTIME-INR
INR: 2 — ABNORMAL HIGH (ref 0.8–1.2)
Prothrombin Time: 23.7 s — ABNORMAL HIGH (ref 11.4–15.2)

## 2024-01-14 LAB — GLUCOSE, CAPILLARY
Glucose-Capillary: 142 mg/dL — ABNORMAL HIGH (ref 70–99)
Glucose-Capillary: 140 mg/dL — ABNORMAL HIGH (ref 70–99)
Glucose-Capillary: 121 mg/dL — ABNORMAL HIGH (ref 70–99)
Glucose-Capillary: 121 mg/dL — ABNORMAL HIGH (ref 70–99)
Glucose-Capillary: 158 mg/dL — ABNORMAL HIGH (ref 70–99)

## 2024-01-14 LAB — TROPONIN I (HIGH SENSITIVITY)
Troponin I (High Sensitivity): 150 ng/L (ref <18)
Troponin I (High Sensitivity): 210 ng/L (ref <18)

## 2024-01-14 LAB — MAGNESIUM: Magnesium: 1.2 mg/dL — ABNORMAL LOW (ref 1.7–2.4)

## 2024-01-14 LAB — ABO/RH: ABO/RH(D): B POS

## 2024-01-14 SURGERY — ERCP, WITH INTERVENTION IF INDICATED
Anesthesia: General

## 2024-01-14 MED ORDER — MAGNESIUM SULFATE 2 GM/50ML IV SOLN
2.0000 g | Freq: Once | INTRAVENOUS | Status: AC
  Administered 2024-01-14: 2 g via INTRAVENOUS
  Filled 2024-01-14: qty 50

## 2024-01-14 MED ORDER — FENTANYL CITRATE PF 50 MCG/ML IJ SOSY
25.0000 ug | PREFILLED_SYRINGE | INTRAMUSCULAR | Status: DC | PRN
  Administered 2024-01-14: 25 ug via INTRAVENOUS
  Filled 2024-01-14: qty 1

## 2024-01-14 MED ORDER — FENTANYL CITRATE PF 50 MCG/ML IJ SOSY
PREFILLED_SYRINGE | INTRAMUSCULAR | Status: AC
  Filled 2024-01-14: qty 1

## 2024-01-14 MED ORDER — ARFORMOTEROL TARTRATE 15 MCG/2ML IN NEBU
15.0000 ug | INHALATION_SOLUTION | Freq: Two times a day (BID) | RESPIRATORY_TRACT | Status: DC
  Administered 2024-01-14 – 2024-01-17 (×10): 15 ug via RESPIRATORY_TRACT
  Filled 2024-01-14 (×8): qty 2

## 2024-01-14 MED ORDER — POTASSIUM CHLORIDE 10 MEQ/50ML IV SOLN
10.0000 meq | INTRAVENOUS | Status: AC
  Administered 2024-01-14: 10 meq via INTRAVENOUS
  Filled 2024-01-14 (×6): qty 50

## 2024-01-14 MED ORDER — SUCCINYLCHOLINE CHLORIDE 200 MG/10ML IV SOSY
PREFILLED_SYRINGE | INTRAVENOUS | Status: DC | PRN
  Administered 2024-01-14: 120 mg via INTRAVENOUS

## 2024-01-14 MED ORDER — MAGNESIUM SULFATE 4 GM/100ML IV SOLN
4.0000 g | Freq: Once | INTRAVENOUS | Status: AC
  Administered 2024-01-14: 4 g via INTRAVENOUS
  Filled 2024-01-14: qty 100

## 2024-01-14 MED ORDER — PHENYLEPHRINE HCL (PRESSORS) 10 MG/ML IV SOLN
INTRAVENOUS | Status: DC | PRN
  Administered 2024-01-14 (×3): 160 ug via INTRAVENOUS

## 2024-01-14 MED ORDER — BUDESONIDE 0.25 MG/2ML IN SUSP
0.2500 mg | Freq: Two times a day (BID) | RESPIRATORY_TRACT | Status: DC
  Administered 2024-01-14 – 2024-01-17 (×10): 0.25 mg via RESPIRATORY_TRACT
  Filled 2024-01-14 (×8): qty 2

## 2024-01-14 MED ORDER — ALBUMIN HUMAN 5 % IV SOLN: INTRAVENOUS | Status: DC | PRN

## 2024-01-14 MED ORDER — LACTATED RINGERS IV SOLN: INTRAVENOUS | Status: DC | PRN

## 2024-01-14 MED ORDER — FENTANYL CITRATE (PF) 250 MCG/5ML IJ SOLN
INTRAMUSCULAR | Status: DC | PRN
  Administered 2024-01-14 (×2): 50 ug via INTRAVENOUS

## 2024-01-14 MED ORDER — PERFLUTREN LIPID MICROSPHERE
1.0000 mL | INTRAVENOUS | Status: AC | PRN
  Administered 2024-01-14: 2 mL via INTRAVENOUS

## 2024-01-14 MED ORDER — LORAZEPAM 1 MG PO TABS
1.0000 mg | ORAL_TABLET | Freq: Four times a day (QID) | ORAL | Status: DC | PRN
  Administered 2024-01-14: 1 mg via ORAL
  Filled 2024-01-14: qty 1

## 2024-01-14 MED ORDER — POLYETHYLENE GLYCOL 3350 17 G PO PACK: 17.0000 g | PACK | Freq: Every day | ORAL | Status: DC

## 2024-01-14 MED ORDER — GLUCAGON HCL RDNA (DIAGNOSTIC) 1 MG IJ SOLR
INTRAMUSCULAR | Status: AC
  Filled 2024-01-14: qty 1

## 2024-01-14 MED ORDER — DICLOFENAC SUPPOSITORY 100 MG
RECTAL | Status: DC | PRN
  Administered 2024-01-14: 100 mg via RECTAL

## 2024-01-14 MED ORDER — MIDAZOLAM HCL 2 MG/2ML IJ SOLN
INTRAMUSCULAR | Status: AC
  Filled 2024-01-14: qty 2

## 2024-01-14 MED ORDER — LACTATED RINGERS IV SOLN
INTRAVENOUS | Status: DC | PRN
Start: 2024-01-14 — End: 2024-01-14

## 2024-01-14 MED ORDER — ALBUTEROL SULFATE HFA 108 (90 BASE) MCG/ACT IN AERS
INHALATION_SPRAY | RESPIRATORY_TRACT | Status: DC | PRN
  Administered 2024-01-14: 8 via RESPIRATORY_TRACT

## 2024-01-14 MED ORDER — MIDAZOLAM HCL 2 MG/2ML IJ SOLN
1.0000 mg | Freq: Once | INTRAMUSCULAR | Status: AC
Start: 2024-01-14 — End: 2024-01-14
  Administered 2024-01-14: 1 mg via INTRAVENOUS

## 2024-01-14 MED ORDER — DOCUSATE SODIUM 50 MG/5ML PO LIQD
100.0000 mg | Freq: Two times a day (BID) | ORAL | Status: DC
  Administered 2024-01-15: 100 mg
  Filled 2024-01-14: qty 10

## 2024-01-14 MED ORDER — ALBUTEROL SULFATE (2.5 MG/3ML) 0.083% IN NEBU
2.5000 mg | INHALATION_SOLUTION | RESPIRATORY_TRACT | Status: DC | PRN
  Administered 2024-01-14 – 2024-01-16 (×2): 2.5 mg via RESPIRATORY_TRACT
  Filled 2024-01-14 (×3): qty 3

## 2024-01-14 MED ORDER — POTASSIUM CHLORIDE 10 MEQ/50ML IV SOLN
10.0000 meq | INTRAVENOUS | Status: DC
  Administered 2024-01-14 (×3): 10 meq via INTRAVENOUS
  Filled 2024-01-14 (×3): qty 50

## 2024-01-14 MED ORDER — PROPOFOL 10 MG/ML IV BOLUS
INTRAVENOUS | Status: DC | PRN
  Administered 2024-01-14: 50 ug/kg/min via INTRAVENOUS
  Administered 2024-01-14: 50 mg via INTRAVENOUS

## 2024-01-14 MED ORDER — HYDRALAZINE HCL 20 MG/ML IJ SOLN
10.0000 mg | Freq: Four times a day (QID) | INTRAMUSCULAR | Status: DC | PRN
  Administered 2024-01-14: 10 mg via INTRAVENOUS
  Filled 2024-01-14: qty 1

## 2024-01-14 MED ORDER — PHENYLEPHRINE 80 MCG/ML (10ML) SYRINGE FOR IV PUSH (FOR BLOOD PRESSURE SUPPORT): PREFILLED_SYRINGE | INTRAVENOUS | Status: DC | PRN

## 2024-01-14 MED ORDER — FENTANYL CITRATE (PF) 100 MCG/2ML IJ SOLN
INTRAMUSCULAR | Status: AC
  Filled 2024-01-14: qty 2

## 2024-01-14 MED ORDER — MIDAZOLAM HCL 2 MG/2ML IJ SOLN
INTRAMUSCULAR | Status: DC | PRN
  Administered 2024-01-14 (×2): 1 mg via INTRAVENOUS

## 2024-01-14 MED ORDER — SODIUM CHLORIDE 0.9% IV SOLUTION: Freq: Once | INTRAVENOUS | Status: AC

## 2024-01-14 MED ORDER — FENTANYL CITRATE PF 50 MCG/ML IJ SOSY
25.0000 ug | PREFILLED_SYRINGE | INTRAMUSCULAR | Status: DC | PRN
  Administered 2024-01-14 – 2024-01-15 (×4): 50 ug via INTRAVENOUS
  Filled 2024-01-14 (×4): qty 1

## 2024-01-14 MED ORDER — POTASSIUM CHLORIDE 10 MEQ/100ML IV SOLN FOR RAPID INFUSION
10.0000 meq | INTRAVENOUS | Status: AC
  Administered 2024-01-14 (×3): 10 meq via INTRAVENOUS
  Filled 2024-01-14 (×3): qty 100

## 2024-01-14 MED ORDER — ROCURONIUM BROMIDE 10 MG/ML (PF) SYRINGE
PREFILLED_SYRINGE | INTRAVENOUS | Status: DC | PRN
  Administered 2024-01-14 (×2): 20 mg via INTRAVENOUS
  Administered 2024-01-14: 40 mg via INTRAVENOUS

## 2024-01-14 MED ORDER — PROPOFOL 1000 MG/100ML IV EMUL
0.0000 ug/kg/min | INTRAVENOUS | Status: DC
  Administered 2024-01-14 – 2024-01-15 (×2): 20 ug/kg/min via INTRAVENOUS
  Administered 2024-01-15: 30 ug/kg/min via INTRAVENOUS
  Filled 2024-01-14 (×3): qty 100

## 2024-01-14 MED ORDER — MIDAZOLAM HCL 2 MG/2ML IJ SOLN
1.0000 mg | Freq: Once | INTRAMUSCULAR | Status: AC
  Administered 2024-01-14: 1 mg via INTRAVENOUS
  Filled 2024-01-14: qty 2

## 2024-01-14 MED ORDER — POTASSIUM CHLORIDE 10 MEQ/50ML IV SOLN
10.0000 meq | INTRAVENOUS | Status: AC
  Administered 2024-01-14 – 2024-01-15 (×3): 10 meq via INTRAVENOUS

## 2024-01-14 MED ORDER — MIDAZOLAM HCL 2 MG/2ML IJ SOLN
INTRAMUSCULAR | Status: AC
Start: 2024-01-14 — End: 2024-01-14
  Filled 2024-01-14: qty 2

## 2024-01-14 MED ORDER — SODIUM CHLORIDE 0.9 % IV SOLN
INTRAVENOUS | Status: DC | PRN
  Administered 2024-01-14: 10 mL

## 2024-01-14 NOTE — Consult Note (Signed)
 [facility] Consultation Note  Referring Provider: [facility] Primary Care Physician:  Geofm Glade PARAS, MD Primary Gastroenterologist:  ERCP procedure Dr. Rosalie  Reason for Consultation:  cholangitis  HPI: Lindsay Hardy is a 73 y.o. female admitted altered mental status, fevers, hypotension, elevated LFTs.  Recent diagnosis pancreatic cancer with biliary stent placement about one month ago with Dr. Rosalie.  Patient is not feeling well, has abdominal pain, is on pressors, had gram negative rod bacteremia.   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 Avram   Granulomatous disease, chronic (HCC)     in right upper lobe per x-ray December 2005   H/O bilateral cataract extraction    Hyperlipidemia    Hypertension    NAFLD (nonalcoholic fatty liver disease)     based on ultrasound June 2002, elevated transaminase   OA (osteoarthritis)     DJP coracoclavicular ligament, left patella osteophytes June 2002, L4-S1 spondylosis, degenerative disc disease with bulge. I in October 2001, the C2-C4 spondylosis without stenosis and with spurs based on x-ray July 2005; diffuse idiopathic skeletal hyperostosis with bilateral hip lumbar degenerative changes   on x-ray February 2005, bilateral plantar calcaneal spurs   Reactive airway disease      peak flow 180 in May 2005, chronic sinusitis with PND, bronchitis December 2002 and strep pneumonia April 2007,  FEC 66, MCV 60, ratio is 69, DLCO 57,  moderate restrictive and mild obstructive disease  on spirometry May 2005    Past Surgical History:  Procedure Laterality Date   BILIARY BRUSHING  12/08/2023   Procedure: BRUSH BIOPSY, BILE DUCT;  Surgeon: Rosalie Kitchens, MD;  Location: [facility] ENDOSCOPY;  Service: Gastroenterology;;   BILIARY STENT PLACEMENT  12/08/2023   Procedure: INSERTION, STENT, BILE DUCT;  Surgeon: Rosalie Kitchens, MD;  Location: [facility] ENDOSCOPY;  Service: Gastroenterology;;   ORIN MEDIATE RELEASE  1992    left arm 1992   CHOLECYSTECTOMY  2003   ERCP N/A 12/08/2023   Procedure: ERCP, WITH INTERVENTION IF INDICATED;  Surgeon: Rosalie Kitchens, MD;  Location: [facility] ENDOSCOPY;  Service: Gastroenterology;  Laterality: N/A;   PORTACATH PLACEMENT Left 12/27/2023   Procedure: INSERTION, TUNNELED CENTRAL VENOUS DEVICE, WITH PORT;  Surgeon: Dasie Leonor CROME, MD;  Location: [facility] OR;  Service: General;  Laterality: Left;  PORT-A-CATH INSERTION   TONSILLECTOMY AND ADENOIDECTOMY      Prior to Admission medications   Medication Sig Start Date End Date Taking? Authorizing Provider  albuterol  (VENTOLIN  HFA) 108 (90 Base) MCG/ACT inhaler INHALE 2 PUFFS INTO THE LUNGS EVERY 4 (FOUR) HOURS AS NEEDED FOR SHORTNESS OF BREATH. 03/12/23  Yes Burns, Glade PARAS, MD  amLODipine  (NORVASC ) 10 MG tablet Take 1 tablet (10 mg total) by mouth daily. 03/12/23  Yes Geofm Glade PARAS, MD  cyanocobalamin  (VITAMIN B12) 500 MCG tablet Take 500 mcg by mouth daily.   Yes [provider]  fluticasone  (FLONASE ) 50 MCG/ACT nasal spray Place 2 sprays into both nostrils daily. Patient taking differently: Place 2 sprays into both nostrils daily as needed for allergies or rhinitis. 03/12/23  Yes Burns, Glade PARAS, MD  glipiZIDE  (GLUCOTROL ) 10 MG tablet TAKE 2 TABLETS BY MOUTH 2 TIMES DAILY BEFORE A MEAL. Patient taking differently: Take 20 mg by  mouth 2 (two) times daily before a meal. TAKE 2 TABLETS BY MOUTH 2 TIMES DAILY BEFORE A MEAL. 03/12/23  Yes Burns, Glade PARAS, MD  hydrochlorothiazide  (HYDRODIURIL ) 25 MG tablet Take 1 tablet (25 mg total) by mouth daily. 11/23/23  Yes Burns, Glade PARAS, MD  HYDROcodone -acetaminophen  (NORCO) 10-325 MG tablet Take 1-2 tablets by mouth every 4 (four) hours as needed for moderate pain (pain score 4-6) or severe pain (pain score 7-10). 12/13/23  Yes Patsy Lenis, MD  ibuprofen  (ADVIL ) 800 MG tablet TAKE 1 TABLET BY MOUTH TWICE DAILY AS NEEDED FOR MODERATE PAIN 11/30/23  Yes Burns, Glade PARAS, MD  insulin  glargine (LANTUS ) 100 UNIT/ML Solostar Pen Inject 20 Units into the skin daily. 11/30/23  Yes Burns, Glade PARAS, MD  metFORMIN  (GLUCOPHAGE -XR) 500 MG 24 hr tablet TAKE 2 TABLETS BY MOUTH TWICE DAILY BEFORE A MEAL Patient taking differently: Take 1,000 mg by mouth 2 (two) times daily with a meal. 09/23/23  Yes Burns, Glade PARAS, MD  montelukast  (SINGULAIR ) 10 MG tablet TAKE 1 TABLET BY MOUTH EVRY DAY AT BEDTIME 09/23/23  Yes Burns, Glade PARAS, MD  ondansetron  (ZOFRAN ) 8 MG tablet Take 1 tablet (8 mg total) by mouth every 8 (eight) hours as needed. Patient taking differently: Take 8 mg by mouth every 8 (eight) hours as needed for nausea, vomiting or refractory nausea / vomiting. 12/24/23  Yes Cloretta Arley NOVAK, MD  lidocaine -prilocaine  (EMLA ) cream Apply 1 Application topically as needed (Apply to Port 1-2 hours prior to its use start using with 2nd treatment). Patient not taking: No sig reported 12/24/23   Cloretta Arley NOVAK, MD  linaclotide  (LINZESS ) 145 MCG CAPS capsule TAKE 1 CAPSULE BY MOUTH DAILY BEFORE BREAKFAST. Patient not taking: Reported on 12/07/2023 03/12/23   Geofm Glade PARAS, MD  potassium chloride  (MICRO-K ) 10 MEQ CR capsule TAKE 1 CAPSULE BY MOUTH TWICE A DAY Patient not taking: Reported on 01/14/2024 06/07/23   Geofm Glade PARAS, MD  prochlorperazine  (COMPAZINE ) 10 MG tablet Take 1 tablet (10 mg total) by mouth every 6 (six)  hours as needed for nausea or vomiting. Patient not taking: No sig reported 12/24/23   Cloretta Arley NOVAK, MD    Current Facility-Administered Medications  Medication Dose Route Frequency Provider Last Rate Last Admin   0.9 %  sodium chloride  infusion (Manually program via Guardrails IV Fluids)   Intravenous Once Hattar, Zola SAILOR, MD       albuterol  (PROVENTIL ) (2.5 MG/3ML) 0.083% nebulizer solution 2.5 mg  2.5 mg Nebulization Q4H PRN Hattar, Zola SAILOR, MD       arformoterol  (BROVANA ) nebulizer solution 15 mcg  15 mcg Nebulization BID Hattar, Laith N, MD   15 mcg at 01/14/24 9141   budesonide  (PULMICORT ) nebulizer solution 0.25 mg  0.25 mg Nebulization BID Hattar, Laith N, MD   0.25 mg at 01/14/24 9141   Chlorhexidine  Gluconate Cloth 2 % PADS 6 each  6 each Topical Daily Maree Harder, MD       docusate sodium  (COLACE) capsule 100 mg  100  mg Oral BID PRN Rosan Deward ORN, NP       enoxaparin  (LOVENOX ) injection 40 mg  40 mg Subcutaneous Q24H Rosan Deward ORN, NP   40 mg at 01/14/24 9089   insulin  aspart (novoLOG ) injection 0-15 Units  0-15 Units Subcutaneous Q4H Rosan Deward ORN, NP   2 Units at 01/14/24 9183   LORazepam  (ATIVAN ) tablet 1 mg  1 mg Oral Q6H PRN Haze Led, MD   1 mg at 01/14/24 9579   magnesium  sulfate IVPB 2 g 50 mL  2 g Intravenous Once Paliwal, Aditya, MD 50 mL/hr at 01/14/24 0947 2 g at 01/14/24 0947   norepinephrine  (LEVOPHED ) 4mg  in (0.016 mg/mL) premix infusion  0-40 mcg/min Intravenous Continuous Curatolo, Adam, DO 22.5 mL/hr at 01/14/24 0815 6 mcg/min at 01/14/24 0815   Oral care mouth rinse  15 mL Mouth Rinse PRN Maree Harder, MD       piperacillin -tazobactam (ZOSYN ) IVPB 3.375 g  3.375 g Intravenous Q8H Hershal Vito MATSU, RPH 12.5 mL/hr at 01/14/24 0600 Infusion Verify at 01/14/24 0600   polyethylene glycol (MIRALAX  / GLYCOLAX ) packet 17 g  17 g Oral Daily PRN Rosan Deward ORN, NP       potassium chloride  10 mEq in 50 mL *CENTRAL LINE* IVPB  10 mEq Intravenous Q1H Paliwal,  Led, MD 50 mL/hr at 01/14/24 1014 10 mEq at 01/14/24 1014   sodium chloride  flush (NS) 0.9 % injection 10-40 mL  10-40 mL Intracatheter Q12H Maree Harder, MD   10 mL at 01/13/24 2301   sodium chloride  flush (NS) 0.9 % injection 10-40 mL  10-40 mL Intracatheter PRN Maree Harder, MD       vasopressin  (PITRESSIN) 20 Units in 100 mL (0.2 unit/mL) infusion-*FOR SHOCK*  0.04 Units/min Intravenous Continuous Paliwal, Aditya, MD        Allergies as of 01/13/2024 - Review Complete 01/13/2024  Allergen Reaction Noted   Atorvastatin  06/27/2006   Clopidogrel bisulfate     Crestor  [rosuvastatin  calcium ]  03/25/2016   Fexofenadine     Furosemide  06/27/2006   Gabapentin   08/13/2020   Hydrochlorothiazide -triamterene     Lisinopril     Metoprolol  tartrate     Mometasone furoate     Telmisartan   04/09/2021   Ozempic  (0.25 or 0.5 mg-dose) [semaglutide (0.25 or 0.5mg -dos)]  11/01/2019    Family History  Problem Relation Age of Onset   Lung cancer Mother    Hypertension Mother    Lung cancer Sister    Hypertension Sister     Social History   Socioeconomic History   Marital status: Single    Spouse name: Not on file   Number of children: 1   Years of education: 14   Highest education level: Some college, no degree  Occupational History   Occupation:  former Agricultural engineer  Tobacco Use   Smoking status: Former    Current packs/day: 0.25    Average packs/day: 0.3 packs/day for 4.0 years (1.0 ttl pk-yrs)    Types: Cigarettes   Smokeless tobacco: Never   Tobacco comments:    smoked in 20's   Vaping Use   Vaping status: Never Used  Substance and Sexual Activity   Alcohol  use: No    Alcohol /week: 0.0 standard drinks of alcohol    Drug use: No   Sexual activity: Not Currently    Birth control/protection: Post-menopausal  Other Topics Concern   Not on file  Social History Narrative   Born and raised in [place], [place]. Currently  lives in a private residence with her nephew and his  wife. Fun: Church activities, spending time with family   Denies any religious beliefs that would effect health care.    Social Drivers of Corporate investment banker Strain: Low Risk  (11/18/2023)   Overall Financial Resource Strain (CARDIA)    Difficulty of Paying Living Expenses: Not hard at all  Food Insecurity: No Food Insecurity (12/22/2023)   Hunger Vital Sign    Worried About Running Out of Food in the Last Year: Never true    Ran Out of Food in the Last Year: Never true  Transportation Needs: No Transportation Needs (12/22/2023)   PRAPARE - Administrator, Civil Service (Medical): No    Lack of Transportation (Non-Medical): No  Physical Activity: Inactive (11/18/2023)   Exercise Vital Sign    Days of Exercise per Week: 0 days    Minutes of Exercise per Session: 0 min  Stress: No Stress Concern Present (11/18/2023)   [employer] of Occupational Health - Occupational Stress Questionnaire    Feeling of Stress: Only a little  Social Connections: Moderately Integrated (12/08/2023)   Social Connection and Isolation Panel    Frequency of Communication with Friends and Family: More than three times a week    Frequency of Social Gatherings with Friends and Family: More than three times a week    Attends Religious Services: More than 4 times per year    Active Member of [employer] or Organizations: No    Attends Banker Meetings: Never    Marital Status: Living with partner  Intimate Partner Violence: Not At Risk (12/22/2023)   Humiliation, Afraid, Rape, and Kick questionnaire    Fear of Current or Ex-Partner: No    Emotionally Abused: No    Physically Abused: No    Sexually Abused: No    Review of Systems: As per HPI, all others negative  Physical Exam: Vital signs in last 24 hours: Temp:  [98.1 F (36.7 C)-103 F (39.4 C)] 98.4 F (36.9 C) (08/08 0752) Pulse Rate:  [106-155] 134 (08/08 0700) Resp:  [18-43] 43 (08/08 0700) BP: (62-154)/(33-125) 154/82  (08/08 0700) SpO2:  [73 %-100 %] 100 % (08/08 0700) Weight:  [106.8 kg-107.2 kg] 107.2 kg (08/08 0500) Last BM Date :  (PTA) General:   Alert,  obese, ill-appearing, tachypneic Head:  Normocephalic and atraumatic. Eyes:  Sclera icteric  Conjunctiva pink. Ears:  Normal auditory acuity. Nose:  No deformity, discharge,  or lesions. Mouth:  No deformity or lesions.  Oropharynx pink & moist. Neck:  Supple; no masses or thyromegaly. Lungs:  Tachypneic at rest Abdomen:  Soft, protuberant, mild generalized upper abdominal tenderness, No masses, hepatosplenomegaly or hernias noted. No guarding, and without rebound.     Msk:  Symmetrical without gross deformities. Normal posture. Pulses:  Normal pulses noted. Extremities:  Without clubbing or edema. Neurologic:  Alert and  oriented x4;  somnolent but conversant, answers questions appropriately, diffusely week but grossly normal neurologically. Skin:  Intact without significant lesions or rashes. Psych:  Alert and cooperative. Normal mood and affect.   Lab Results: Recent Labs    01/13/24 0915 01/13/24 1601 01/14/24 0500  WBC 12.4* 10.9* 27.7*  HGB 9.6* 11.5* 7.7*  HCT 29.1* 36.2 23.9*  PLT 547* 575* 296   BMET Recent Labs    01/13/24 0915 01/13/24 1601 01/14/24 0500  NA 132* 133* 131*  K 3.2* 3.6 2.7*  CL 94* 94* 99  CO2 26  18* 14*  GLUCOSE 363* 262* 327*  BUN 16 17 13   CREATININE 0.96 1.14* 1.28*  CALCIUM  8.7* 9.0 6.5*   LFT Recent Labs    01/14/24 0500  PROT 5.4*  ALBUMIN  <1.5*  AST 354*  ALT 141*  ALKPHOS 331*  BILITOT 2.8*   PT/INR Recent Labs    01/14/24 0500  LABPROT 23.7*  INR 2.0*    Studies/Results: CT Angio Chest PE W and/or Wo Contrast Result Date: 01/13/2024 CLINICAL DATA:  Chest and abdominal pain. Pancreatic adenocarcinoma. Concern for pulmonary embolism. EXAM: CT ANGIOGRAPHY CHEST CT ABDOMEN AND PELVIS WITH CONTRAST TECHNIQUE: Multidetector CT imaging of the chest was performed using the  standard protocol during bolus administration of intravenous contrast. Multiplanar CT image reconstructions and MIPs were obtained to evaluate the vascular anatomy. Multidetector CT imaging of the abdomen and pelvis was performed using the standard protocol during bolus administration of intravenous contrast. RADIATION DOSE REDUCTION: This exam was performed according to the departmental dose-optimization program which includes automated exposure control, adjustment of the mA and/or kV according to patient size and/or use of iterative reconstruction technique. CONTRAST:  OMNIPAQUE  IOHEXOL  350 MG/ML SOLN COMPARISON:  Chest CT dated 12/09/2023 and CT abdomen pelvis dated 12/07/2023. FINDINGS: Evaluation of this exam is limited due to respiratory motion. CTA CHEST FINDINGS Cardiovascular: There is no cardiomegaly or pericardial effusion. There is 3 vessel coronary vascular calcification. Mild atherosclerotic calcification of the thoracic aorta. No aneurysmal dilatation or dissection. Evaluation of the pulmonary arteries is limited due to respiratory motion and suboptimal visualization of the peripheral branches. No central pulmonary artery embolus identified. Mediastinum/Nodes: No hilar or mediastinal adenopathy. The esophagus and the thyroid  gland are grossly unremarkable. No mediastinal fluid collection. Right-sided Port-A-Cath tip close to the cavoatrial junction. Lungs/Pleura: Right suprahilar/apical scarring as seen on the prior CT. There is background of emphysema. Small scattered calcified granuloma. No focal consolidation, pleural effusion, or pneumothorax. The central airways are patent. Musculoskeletal: Degenerative changes of the spine. No acute osseous pathology. Review of the MIP images confirms the above findings. CT ABDOMEN and PELVIS FINDINGS No intra-abdominal free air or free fluid. Hepatobiliary: Fatty liver. Several rounded hypoenhancing areas within the liver measuring up to 3.3 x 2.8 cm may  represent post treatment changes and areas of infarct. There is looping abscesses are less likely but not excluded. There is mild dilatation, post cholecystectomy. Interval placement of a Wallstent in the common bile duct. There is mild pneumobilia. Pancreas: Pancreatic atrophy, progressed since the prior CT. Ill-defined hypoenhancing area in the uncinate process of the pancreas is poorly visualized and suboptimally evaluated due to respiratory motion. Spleen: Normal in size without focal abnormality. Adrenals/Urinary Tract: The adrenal glands are unremarkable. There is no hydronephrosis on either side there is symmetric enhancement and excretion of contrast by both kidneys. The visualized ureters and urinary bladder appear unremarkable. Stomach/Bowel: Mild diffuse thickened appearance of the colon, likely related to underdistention. Colitis is less likely but not excluded clinical correlation is recommended. There is no bowel obstruction. The appendix is not visualized with certainty. No inflammatory changes identified in the right lower quadrant. Vascular/Lymphatic: Mild aortoiliac atherosclerotic disease. The IVC is unremarkable. No portal venous gas. No adenopathy. Reproductive: Calcified uterine fibroids. No suspicious adnexal masses. Other: None Musculoskeletal: Degenerative changes of the spine. No acute osseous pathology. Review of the MIP images confirms the above findings. IMPRESSION: 1. No acute intrathoracic pathology. No central pulmonary artery embolus identified. 2. Interval placement of a Wallstent in the common bile  duct. 3. Fatty liver. Several rounded hypoenhancing areas within the liver may represent post treatment changes and areas of infarct. Abscesses are less likely but not excluded. 4. Mild diffuse thickened appearance of the colon, likely related to underdistention. Colitis is less likely but not excluded clinical correlation is recommended. No bowel obstruction. 5.  Aortic Atherosclerosis  (ICD10-I70.0). Electronically Signed   By: Vanetta Chou M.D.   On: 01/13/2024 16:52   CT ABDOMEN PELVIS W CONTRAST Result Date: 01/13/2024 CLINICAL DATA:  Chest and abdominal pain. Pancreatic adenocarcinoma. Concern for pulmonary embolism. EXAM: CT ANGIOGRAPHY CHEST CT ABDOMEN AND PELVIS WITH CONTRAST TECHNIQUE: Multidetector CT imaging of the chest was performed using the standard protocol during bolus administration of intravenous contrast. Multiplanar CT image reconstructions and MIPs were obtained to evaluate the vascular anatomy. Multidetector CT imaging of the abdomen and pelvis was performed using the standard protocol during bolus administration of intravenous contrast. RADIATION DOSE REDUCTION: This exam was performed according to the departmental dose-optimization program which includes automated exposure control, adjustment of the mA and/or kV according to patient size and/or use of iterative reconstruction technique. CONTRAST:  OMNIPAQUE  IOHEXOL  350 MG/ML SOLN COMPARISON:  Chest CT dated 12/09/2023 and CT abdomen pelvis dated 12/07/2023. FINDINGS: Evaluation of this exam is limited due to respiratory motion. CTA CHEST FINDINGS Cardiovascular: There is no cardiomegaly or pericardial effusion. There is 3 vessel coronary vascular calcification. Mild atherosclerotic calcification of the thoracic aorta. No aneurysmal dilatation or dissection. Evaluation of the pulmonary arteries is limited due to respiratory motion and suboptimal visualization of the peripheral branches. No central pulmonary artery embolus identified. Mediastinum/Nodes: No hilar or mediastinal adenopathy. The esophagus and the thyroid  gland are grossly unremarkable. No mediastinal fluid collection. Right-sided Port-A-Cath tip close to the cavoatrial junction. Lungs/Pleura: Right suprahilar/apical scarring as seen on the prior CT. There is background of emphysema. Small scattered calcified granuloma. No focal consolidation, pleural  effusion, or pneumothorax. The central airways are patent. Musculoskeletal: Degenerative changes of the spine. No acute osseous pathology. Review of the MIP images confirms the above findings. CT ABDOMEN and PELVIS FINDINGS No intra-abdominal free air or free fluid. Hepatobiliary: Fatty liver. Several rounded hypoenhancing areas within the liver measuring up to 3.3 x 2.8 cm may represent post treatment changes and areas of infarct. There is looping abscesses are less likely but not excluded. There is mild dilatation, post cholecystectomy. Interval placement of a Wallstent in the common bile duct. There is mild pneumobilia. Pancreas: Pancreatic atrophy, progressed since the prior CT. Ill-defined hypoenhancing area in the uncinate process of the pancreas is poorly visualized and suboptimally evaluated due to respiratory motion. Spleen: Normal in size without focal abnormality. Adrenals/Urinary Tract: The adrenal glands are unremarkable. There is no hydronephrosis on either side there is symmetric enhancement and excretion of contrast by both kidneys. The visualized ureters and urinary bladder appear unremarkable. Stomach/Bowel: Mild diffuse thickened appearance of the colon, likely related to underdistention. Colitis is less likely but not excluded clinical correlation is recommended. There is no bowel obstruction. The appendix is not visualized with certainty. No inflammatory changes identified in the right lower quadrant. Vascular/Lymphatic: Mild aortoiliac atherosclerotic disease. The IVC is unremarkable. No portal venous gas. No adenopathy. Reproductive: Calcified uterine fibroids. No suspicious adnexal masses. Other: None Musculoskeletal: Degenerative changes of the spine. No acute osseous pathology. Review of the MIP images confirms the above findings. IMPRESSION: 1. No acute intrathoracic pathology. No central pulmonary artery embolus identified. 2. Interval placement of a Wallstent in the common  bile duct. 3.  Fatty liver. Several rounded hypoenhancing areas within the liver may represent post treatment changes and areas of infarct. Abscesses are less likely but not excluded. 4. Mild diffuse thickened appearance of the colon, likely related to underdistention. Colitis is less likely but not excluded clinical correlation is recommended. No bowel obstruction. 5.  Aortic Atherosclerosis (ICD10-I70.0). Electronically Signed   By: Vanetta Chou M.D.   On: 01/13/2024 16:52   CT Head Wo Contrast Result Date: 01/13/2024 CLINICAL DATA:  Altered level of consciousness, pancreatic cancer EXAM: CT HEAD WITHOUT CONTRAST TECHNIQUE: Contiguous axial images were obtained from the base of the skull through the vertex without intravenous contrast. RADIATION DOSE REDUCTION: This exam was performed according to the departmental dose-optimization program which includes automated exposure control, adjustment of the mA and/or kV according to patient size and/or use of iterative reconstruction technique. COMPARISON:  None Available. FINDINGS: Brain: There are indeterminate hypodensities within the right frontal subcortical white matter, right parietal and occipital periventricular white matter, and left occipital cortex, most consistent with age indeterminate ischemic changes. Chronic appearing ischemic changes are seen within the cerebellar hemispheres and left occipital cortex. No evidence of acute hemorrhage. Lateral ventricles and remaining midline structures are unremarkable. No acute extra-axial fluid collections. No mass effect. Vascular: No hyperdense vessel or unexpected calcification. Diffuse atherosclerosis. Skull: Normal. Negative for fracture or focal lesion. Sinuses/Orbits: No acute finding. Other: None. IMPRESSION: 1. Indeterminate hypodensities within the right frontal, parietal, and occipital white matter as above, favor age indeterminate small vessel ischemic changes. 2. Chronic appearing ischemic changes within the  cerebellar hemispheres and left occipital cortex. 3. No evidence of acute hemorrhage. Electronically Signed   By: Ozell Daring M.D.   On: 01/13/2024 16:49    Impression:   Sepsis with gram-negative bacteremia, severe, manifested by ongoing pressor-requiring hypotension, leukocytosis, fevers, coagulopathy, lactic acidosis compounded by tachypnea. Most consistent cholangitis. Recent diagnosis of obstructive pancreatic cancer s/p stent placement Dr. Rosalie July 2nd. Imaging shows pneumobilia of left lobe of liver but very scant pneumobilia of right lobe, I wonder if there is incomplete biliary drainage to right hepatic lobe from stent or tumor.  Likely also may have developing liver abscesses.  Plan:   IV fluids, pressors, antibiotics, supportive care for sepsis. FFP to correct coagulopathy Emergent ERCP today with Dr. Saintclair to assess for stent patency and adequacy of biliary drainage. Patient is critically ill and ERCP today imparts higher-than-average risks, but without it we won't know whether or not the biliary stent is not adequately functioning. Risks (up to and including bleeding, infection, perforation, pancreatitis that can be complicated by infected necrosis and death), benefits (removal of stones, alleviating blockage, decreasing risk of cholangitis or choledocholithiasis-related pancreatitis), and alternatives (watchful waiting, percutaneous transhepatic cholangiography) of ERCP were explained to patient/family in detail and patient elects to proceed.  [facility] will follow.   LOS: 1 day   Marieli Rudy M  01/14/2024, 10:32 AM  Cell [phone] If no answer or after 5 PM call [phone]

## 2024-01-14 NOTE — H&P (View-Only) (Signed)
 [facility] Consultation Note  Referring Provider: [facility] Primary Care Physician:  Geofm Glade PARAS, MD Primary Gastroenterologist:  ERCP procedure Dr. Rosalie  Reason for Consultation:  cholangitis  HPI: Lindsay Hardy is a 73 y.o. female admitted altered mental status, fevers, hypotension, elevated LFTs.  Recent diagnosis pancreatic cancer with biliary stent placement about one month ago with Dr. Rosalie.  Patient is not feeling well, has abdominal pain, is on pressors, had gram negative rod bacteremia.   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 Avram   Granulomatous disease, chronic (HCC)     in right upper lobe per x-ray December 2005   H/O bilateral cataract extraction    Hyperlipidemia    Hypertension    NAFLD (nonalcoholic fatty liver disease)     based on ultrasound June 2002, elevated transaminase   OA (osteoarthritis)     DJP coracoclavicular ligament, left patella osteophytes June 2002, L4-S1 spondylosis, degenerative disc disease with bulge. I in October 2001, the C2-C4 spondylosis without stenosis and with spurs based on x-ray July 2005; diffuse idiopathic skeletal hyperostosis with bilateral hip lumbar degenerative changes   on x-ray February 2005, bilateral plantar calcaneal spurs   Reactive airway disease      peak flow 180 in May 2005, chronic sinusitis with PND, bronchitis December 2002 and strep pneumonia April 2007,  FEC 66, MCV 60, ratio is 69, DLCO 57,  moderate restrictive and mild obstructive disease  on spirometry May 2005    Past Surgical History:  Procedure Laterality Date   BILIARY BRUSHING  12/08/2023   Procedure: BRUSH BIOPSY, BILE DUCT;  Surgeon: Rosalie Kitchens, MD;  Location: [facility] ENDOSCOPY;  Service: Gastroenterology;;   BILIARY STENT PLACEMENT  12/08/2023   Procedure: INSERTION, STENT, BILE DUCT;  Surgeon: Rosalie Kitchens, MD;  Location: [facility] ENDOSCOPY;  Service: Gastroenterology;;   ORIN MEDIATE RELEASE  1992    left arm 1992   CHOLECYSTECTOMY  2003   ERCP N/A 12/08/2023   Procedure: ERCP, WITH INTERVENTION IF INDICATED;  Surgeon: Rosalie Kitchens, MD;  Location: [facility] ENDOSCOPY;  Service: Gastroenterology;  Laterality: N/A;   PORTACATH PLACEMENT Left 12/27/2023   Procedure: INSERTION, TUNNELED CENTRAL VENOUS DEVICE, WITH PORT;  Surgeon: Dasie Leonor CROME, MD;  Location: [facility] OR;  Service: General;  Laterality: Left;  PORT-A-CATH INSERTION   TONSILLECTOMY AND ADENOIDECTOMY      Prior to Admission medications   Medication Sig Start Date End Date Taking? Authorizing Provider  albuterol  (VENTOLIN  HFA) 108 (90 Base) MCG/ACT inhaler INHALE 2 PUFFS INTO THE LUNGS EVERY 4 (FOUR) HOURS AS NEEDED FOR SHORTNESS OF BREATH. 03/12/23  Yes Burns, Glade PARAS, MD  amLODipine  (NORVASC ) 10 MG tablet Take 1 tablet (10 mg total) by mouth daily. 03/12/23  Yes Geofm Glade PARAS, MD  cyanocobalamin  (VITAMIN B12) 500 MCG tablet Take 500 mcg by mouth daily.   Yes [provider]  fluticasone  (FLONASE ) 50 MCG/ACT nasal spray Place 2 sprays into both nostrils daily. Patient taking differently: Place 2 sprays into both nostrils daily as needed for allergies or rhinitis. 03/12/23  Yes Burns, Glade PARAS, MD  glipiZIDE  (GLUCOTROL ) 10 MG tablet TAKE 2 TABLETS BY MOUTH 2 TIMES DAILY BEFORE A MEAL. Patient taking differently: Take 20 mg by  mouth 2 (two) times daily before a meal. TAKE 2 TABLETS BY MOUTH 2 TIMES DAILY BEFORE A MEAL. 03/12/23  Yes Burns, Glade PARAS, MD  hydrochlorothiazide  (HYDRODIURIL ) 25 MG tablet Take 1 tablet (25 mg total) by mouth daily. 11/23/23  Yes Burns, Glade PARAS, MD  HYDROcodone -acetaminophen  (NORCO) 10-325 MG tablet Take 1-2 tablets by mouth every 4 (four) hours as needed for moderate pain (pain score 4-6) or severe pain (pain score 7-10). 12/13/23  Yes Patsy Lenis, MD  ibuprofen  (ADVIL ) 800 MG tablet TAKE 1 TABLET BY MOUTH TWICE DAILY AS NEEDED FOR MODERATE PAIN 11/30/23  Yes Burns, Glade PARAS, MD  insulin  glargine (LANTUS ) 100 UNIT/ML Solostar Pen Inject 20 Units into the skin daily. 11/30/23  Yes Burns, Glade PARAS, MD  metFORMIN  (GLUCOPHAGE -XR) 500 MG 24 hr tablet TAKE 2 TABLETS BY MOUTH TWICE DAILY BEFORE A MEAL Patient taking differently: Take 1,000 mg by mouth 2 (two) times daily with a meal. 09/23/23  Yes Burns, Glade PARAS, MD  montelukast  (SINGULAIR ) 10 MG tablet TAKE 1 TABLET BY MOUTH EVRY DAY AT BEDTIME 09/23/23  Yes Burns, Glade PARAS, MD  ondansetron  (ZOFRAN ) 8 MG tablet Take 1 tablet (8 mg total) by mouth every 8 (eight) hours as needed. Patient taking differently: Take 8 mg by mouth every 8 (eight) hours as needed for nausea, vomiting or refractory nausea / vomiting. 12/24/23  Yes Cloretta Arley NOVAK, MD  lidocaine -prilocaine  (EMLA ) cream Apply 1 Application topically as needed (Apply to Port 1-2 hours prior to its use start using with 2nd treatment). Patient not taking: No sig reported 12/24/23   Cloretta Arley NOVAK, MD  linaclotide  (LINZESS ) 145 MCG CAPS capsule TAKE 1 CAPSULE BY MOUTH DAILY BEFORE BREAKFAST. Patient not taking: Reported on 12/07/2023 03/12/23   Geofm Glade PARAS, MD  potassium chloride  (MICRO-K ) 10 MEQ CR capsule TAKE 1 CAPSULE BY MOUTH TWICE A DAY Patient not taking: Reported on 01/14/2024 06/07/23   Geofm Glade PARAS, MD  prochlorperazine  (COMPAZINE ) 10 MG tablet Take 1 tablet (10 mg total) by mouth every 6 (six)  hours as needed for nausea or vomiting. Patient not taking: No sig reported 12/24/23   Cloretta Arley NOVAK, MD    Current Facility-Administered Medications  Medication Dose Route Frequency Provider Last Rate Last Admin   0.9 %  sodium chloride  infusion (Manually program via Guardrails IV Fluids)   Intravenous Once Hattar, Zola SAILOR, MD       albuterol  (PROVENTIL ) (2.5 MG/3ML) 0.083% nebulizer solution 2.5 mg  2.5 mg Nebulization Q4H PRN Hattar, Zola SAILOR, MD       arformoterol  (BROVANA ) nebulizer solution 15 mcg  15 mcg Nebulization BID Hattar, Laith N, MD   15 mcg at 01/14/24 9141   budesonide  (PULMICORT ) nebulizer solution 0.25 mg  0.25 mg Nebulization BID Hattar, Laith N, MD   0.25 mg at 01/14/24 9141   Chlorhexidine  Gluconate Cloth 2 % PADS 6 each  6 each Topical Daily Maree Harder, MD       docusate sodium  (COLACE) capsule 100 mg  100  mg Oral BID PRN Rosan Deward ORN, NP       enoxaparin  (LOVENOX ) injection 40 mg  40 mg Subcutaneous Q24H Rosan Deward ORN, NP   40 mg at 01/14/24 9089   insulin  aspart (novoLOG ) injection 0-15 Units  0-15 Units Subcutaneous Q4H Rosan Deward ORN, NP   2 Units at 01/14/24 9183   LORazepam  (ATIVAN ) tablet 1 mg  1 mg Oral Q6H PRN Haze Led, MD   1 mg at 01/14/24 9579   magnesium  sulfate IVPB 2 g 50 mL  2 g Intravenous Once Paliwal, Aditya, MD 50 mL/hr at 01/14/24 0947 2 g at 01/14/24 0947   norepinephrine  (LEVOPHED ) 4mg  in (0.016 mg/mL) premix infusion  0-40 mcg/min Intravenous Continuous Curatolo, Adam, DO 22.5 mL/hr at 01/14/24 0815 6 mcg/min at 01/14/24 0815   Oral care mouth rinse  15 mL Mouth Rinse PRN Maree Harder, MD       piperacillin -tazobactam (ZOSYN ) IVPB 3.375 g  3.375 g Intravenous Q8H Hershal Vito MATSU, RPH 12.5 mL/hr at 01/14/24 0600 Infusion Verify at 01/14/24 0600   polyethylene glycol (MIRALAX  / GLYCOLAX ) packet 17 g  17 g Oral Daily PRN Rosan Deward ORN, NP       potassium chloride  10 mEq in 50 mL *CENTRAL LINE* IVPB  10 mEq Intravenous Q1H Paliwal,  Led, MD 50 mL/hr at 01/14/24 1014 10 mEq at 01/14/24 1014   sodium chloride  flush (NS) 0.9 % injection 10-40 mL  10-40 mL Intracatheter Q12H Maree Harder, MD   10 mL at 01/13/24 2301   sodium chloride  flush (NS) 0.9 % injection 10-40 mL  10-40 mL Intracatheter PRN Maree Harder, MD       vasopressin  (PITRESSIN) 20 Units in 100 mL (0.2 unit/mL) infusion-*FOR SHOCK*  0.04 Units/min Intravenous Continuous Paliwal, Aditya, MD        Allergies as of 01/13/2024 - Review Complete 01/13/2024  Allergen Reaction Noted   Atorvastatin  06/27/2006   Clopidogrel bisulfate     Crestor  [rosuvastatin  calcium ]  03/25/2016   Fexofenadine     Furosemide  06/27/2006   Gabapentin   08/13/2020   Hydrochlorothiazide -triamterene     Lisinopril     Metoprolol tartrate     Mometasone furoate     Telmisartan   04/09/2021   Ozempic  (0.25 or 0.5 mg-dose) [semaglutide (0.25 or 0.5mg -dos)]  11/01/2019    Family History  Problem Relation Age of Onset   Lung cancer Mother    Hypertension Mother    Lung cancer Sister    Hypertension Sister     Social History   Socioeconomic History   Marital status: Single    Spouse name: Not on file   Number of children: 1   Years of education: 14   Highest education level: Some college, no degree  Occupational History   Occupation:  former Agricultural engineer  Tobacco Use   Smoking status: Former    Current packs/day: 0.25    Average packs/day: 0.3 packs/day for 4.0 years (1.0 ttl pk-yrs)    Types: Cigarettes   Smokeless tobacco: Never   Tobacco comments:    smoked in 20's   Vaping Use   Vaping status: Never Used  Substance and Sexual Activity   Alcohol  use: No    Alcohol /week: 0.0 standard drinks of alcohol    Drug use: No   Sexual activity: Not Currently    Birth control/protection: Post-menopausal  Other Topics Concern   Not on file  Social History Narrative   Born and raised in [place], [place]. Currently  lives in a private residence with her nephew and his  wife. Fun: Church activities, spending time with family   Denies any religious beliefs that would effect health care.    Social Drivers of Corporate investment banker Strain: Low Risk  (11/18/2023)   Overall Financial Resource Strain (CARDIA)    Difficulty of Paying Living Expenses: Not hard at all  Food Insecurity: No Food Insecurity (12/22/2023)   Hunger Vital Sign    Worried About Running Out of Food in the Last Year: Never true    Ran Out of Food in the Last Year: Never true  Transportation Needs: No Transportation Needs (12/22/2023)   PRAPARE - Administrator, Civil Service (Medical): No    Lack of Transportation (Non-Medical): No  Physical Activity: Inactive (11/18/2023)   Exercise Vital Sign    Days of Exercise per Week: 0 days    Minutes of Exercise per Session: 0 min  Stress: No Stress Concern Present (11/18/2023)   [employer] of Occupational Health - Occupational Stress Questionnaire    Feeling of Stress: Only a little  Social Connections: Moderately Integrated (12/08/2023)   Social Connection and Isolation Panel    Frequency of Communication with Friends and Family: More than three times a week    Frequency of Social Gatherings with Friends and Family: More than three times a week    Attends Religious Services: More than 4 times per year    Active Member of [employer] or Organizations: No    Attends Banker Meetings: Never    Marital Status: Living with partner  Intimate Partner Violence: Not At Risk (12/22/2023)   Humiliation, Afraid, Rape, and Kick questionnaire    Fear of Current or Ex-Partner: No    Emotionally Abused: No    Physically Abused: No    Sexually Abused: No    Review of Systems: As per HPI, all others negative  Physical Exam: Vital signs in last 24 hours: Temp:  [98.1 F (36.7 C)-103 F (39.4 C)] 98.4 F (36.9 C) (08/08 0752) Pulse Rate:  [106-155] 134 (08/08 0700) Resp:  [18-43] 43 (08/08 0700) BP: (62-154)/(33-125) 154/82  (08/08 0700) SpO2:  [73 %-100 %] 100 % (08/08 0700) Weight:  [106.8 kg-107.2 kg] 107.2 kg (08/08 0500) Last BM Date :  (PTA) General:   Alert,  obese, ill-appearing, tachypneic Head:  Normocephalic and atraumatic. Eyes:  Sclera icteric  Conjunctiva pink. Ears:  Normal auditory acuity. Nose:  No deformity, discharge,  or lesions. Mouth:  No deformity or lesions.  Oropharynx pink & moist. Neck:  Supple; no masses or thyromegaly. Lungs:  Tachypneic at rest Abdomen:  Soft, protuberant, mild generalized upper abdominal tenderness, No masses, hepatosplenomegaly or hernias noted. No guarding, and without rebound.     Msk:  Symmetrical without gross deformities. Normal posture. Pulses:  Normal pulses noted. Extremities:  Without clubbing or edema. Neurologic:  Alert and  oriented x4;  somnolent but conversant, answers questions appropriately, diffusely week but grossly normal neurologically. Skin:  Intact without significant lesions or rashes. Psych:  Alert and cooperative. Normal mood and affect.   Lab Results: Recent Labs    01/13/24 0915 01/13/24 1601 01/14/24 0500  WBC 12.4* 10.9* 27.7*  HGB 9.6* 11.5* 7.7*  HCT 29.1* 36.2 23.9*  PLT 547* 575* 296   BMET Recent Labs    01/13/24 0915 01/13/24 1601 01/14/24 0500  NA 132* 133* 131*  K 3.2* 3.6 2.7*  CL 94* 94* 99  CO2 26  18* 14*  GLUCOSE 363* 262* 327*  BUN 16 17 13   CREATININE 0.96 1.14* 1.28*  CALCIUM  8.7* 9.0 6.5*   LFT Recent Labs    01/14/24 0500  PROT 5.4*  ALBUMIN  <1.5*  AST 354*  ALT 141*  ALKPHOS 331*  BILITOT 2.8*   PT/INR Recent Labs    01/14/24 0500  LABPROT 23.7*  INR 2.0*    Studies/Results: CT Angio Chest PE W and/or Wo Contrast Result Date: 01/13/2024 CLINICAL DATA:  Chest and abdominal pain. Pancreatic adenocarcinoma. Concern for pulmonary embolism. EXAM: CT ANGIOGRAPHY CHEST CT ABDOMEN AND PELVIS WITH CONTRAST TECHNIQUE: Multidetector CT imaging of the chest was performed using the  standard protocol during bolus administration of intravenous contrast. Multiplanar CT image reconstructions and MIPs were obtained to evaluate the vascular anatomy. Multidetector CT imaging of the abdomen and pelvis was performed using the standard protocol during bolus administration of intravenous contrast. RADIATION DOSE REDUCTION: This exam was performed according to the departmental dose-optimization program which includes automated exposure control, adjustment of the mA and/or kV according to patient size and/or use of iterative reconstruction technique. CONTRAST:  OMNIPAQUE  IOHEXOL  350 MG/ML SOLN COMPARISON:  Chest CT dated 12/09/2023 and CT abdomen pelvis dated 12/07/2023. FINDINGS: Evaluation of this exam is limited due to respiratory motion. CTA CHEST FINDINGS Cardiovascular: There is no cardiomegaly or pericardial effusion. There is 3 vessel coronary vascular calcification. Mild atherosclerotic calcification of the thoracic aorta. No aneurysmal dilatation or dissection. Evaluation of the pulmonary arteries is limited due to respiratory motion and suboptimal visualization of the peripheral branches. No central pulmonary artery embolus identified. Mediastinum/Nodes: No hilar or mediastinal adenopathy. The esophagus and the thyroid  gland are grossly unremarkable. No mediastinal fluid collection. Right-sided Port-A-Cath tip close to the cavoatrial junction. Lungs/Pleura: Right suprahilar/apical scarring as seen on the prior CT. There is background of emphysema. Small scattered calcified granuloma. No focal consolidation, pleural effusion, or pneumothorax. The central airways are patent. Musculoskeletal: Degenerative changes of the spine. No acute osseous pathology. Review of the MIP images confirms the above findings. CT ABDOMEN and PELVIS FINDINGS No intra-abdominal free air or free fluid. Hepatobiliary: Fatty liver. Several rounded hypoenhancing areas within the liver measuring up to 3.3 x 2.8 cm may  represent post treatment changes and areas of infarct. There is looping abscesses are less likely but not excluded. There is mild dilatation, post cholecystectomy. Interval placement of a Wallstent in the common bile duct. There is mild pneumobilia. Pancreas: Pancreatic atrophy, progressed since the prior CT. Ill-defined hypoenhancing area in the uncinate process of the pancreas is poorly visualized and suboptimally evaluated due to respiratory motion. Spleen: Normal in size without focal abnormality. Adrenals/Urinary Tract: The adrenal glands are unremarkable. There is no hydronephrosis on either side there is symmetric enhancement and excretion of contrast by both kidneys. The visualized ureters and urinary bladder appear unremarkable. Stomach/Bowel: Mild diffuse thickened appearance of the colon, likely related to underdistention. Colitis is less likely but not excluded clinical correlation is recommended. There is no bowel obstruction. The appendix is not visualized with certainty. No inflammatory changes identified in the right lower quadrant. Vascular/Lymphatic: Mild aortoiliac atherosclerotic disease. The IVC is unremarkable. No portal venous gas. No adenopathy. Reproductive: Calcified uterine fibroids. No suspicious adnexal masses. Other: None Musculoskeletal: Degenerative changes of the spine. No acute osseous pathology. Review of the MIP images confirms the above findings. IMPRESSION: 1. No acute intrathoracic pathology. No central pulmonary artery embolus identified. 2. Interval placement of a Wallstent in the common bile  duct. 3. Fatty liver. Several rounded hypoenhancing areas within the liver may represent post treatment changes and areas of infarct. Abscesses are less likely but not excluded. 4. Mild diffuse thickened appearance of the colon, likely related to underdistention. Colitis is less likely but not excluded clinical correlation is recommended. No bowel obstruction. 5.  Aortic Atherosclerosis  (ICD10-I70.0). Electronically Signed   By: Vanetta Chou M.D.   On: 01/13/2024 16:52   CT ABDOMEN PELVIS W CONTRAST Result Date: 01/13/2024 CLINICAL DATA:  Chest and abdominal pain. Pancreatic adenocarcinoma. Concern for pulmonary embolism. EXAM: CT ANGIOGRAPHY CHEST CT ABDOMEN AND PELVIS WITH CONTRAST TECHNIQUE: Multidetector CT imaging of the chest was performed using the standard protocol during bolus administration of intravenous contrast. Multiplanar CT image reconstructions and MIPs were obtained to evaluate the vascular anatomy. Multidetector CT imaging of the abdomen and pelvis was performed using the standard protocol during bolus administration of intravenous contrast. RADIATION DOSE REDUCTION: This exam was performed according to the departmental dose-optimization program which includes automated exposure control, adjustment of the mA and/or kV according to patient size and/or use of iterative reconstruction technique. CONTRAST:  OMNIPAQUE  IOHEXOL  350 MG/ML SOLN COMPARISON:  Chest CT dated 12/09/2023 and CT abdomen pelvis dated 12/07/2023. FINDINGS: Evaluation of this exam is limited due to respiratory motion. CTA CHEST FINDINGS Cardiovascular: There is no cardiomegaly or pericardial effusion. There is 3 vessel coronary vascular calcification. Mild atherosclerotic calcification of the thoracic aorta. No aneurysmal dilatation or dissection. Evaluation of the pulmonary arteries is limited due to respiratory motion and suboptimal visualization of the peripheral branches. No central pulmonary artery embolus identified. Mediastinum/Nodes: No hilar or mediastinal adenopathy. The esophagus and the thyroid  gland are grossly unremarkable. No mediastinal fluid collection. Right-sided Port-A-Cath tip close to the cavoatrial junction. Lungs/Pleura: Right suprahilar/apical scarring as seen on the prior CT. There is background of emphysema. Small scattered calcified granuloma. No focal consolidation, pleural  effusion, or pneumothorax. The central airways are patent. Musculoskeletal: Degenerative changes of the spine. No acute osseous pathology. Review of the MIP images confirms the above findings. CT ABDOMEN and PELVIS FINDINGS No intra-abdominal free air or free fluid. Hepatobiliary: Fatty liver. Several rounded hypoenhancing areas within the liver measuring up to 3.3 x 2.8 cm may represent post treatment changes and areas of infarct. There is looping abscesses are less likely but not excluded. There is mild dilatation, post cholecystectomy. Interval placement of a Wallstent in the common bile duct. There is mild pneumobilia. Pancreas: Pancreatic atrophy, progressed since the prior CT. Ill-defined hypoenhancing area in the uncinate process of the pancreas is poorly visualized and suboptimally evaluated due to respiratory motion. Spleen: Normal in size without focal abnormality. Adrenals/Urinary Tract: The adrenal glands are unremarkable. There is no hydronephrosis on either side there is symmetric enhancement and excretion of contrast by both kidneys. The visualized ureters and urinary bladder appear unremarkable. Stomach/Bowel: Mild diffuse thickened appearance of the colon, likely related to underdistention. Colitis is less likely but not excluded clinical correlation is recommended. There is no bowel obstruction. The appendix is not visualized with certainty. No inflammatory changes identified in the right lower quadrant. Vascular/Lymphatic: Mild aortoiliac atherosclerotic disease. The IVC is unremarkable. No portal venous gas. No adenopathy. Reproductive: Calcified uterine fibroids. No suspicious adnexal masses. Other: None Musculoskeletal: Degenerative changes of the spine. No acute osseous pathology. Review of the MIP images confirms the above findings. IMPRESSION: 1. No acute intrathoracic pathology. No central pulmonary artery embolus identified. 2. Interval placement of a Wallstent in the common  bile duct. 3.  Fatty liver. Several rounded hypoenhancing areas within the liver may represent post treatment changes and areas of infarct. Abscesses are less likely but not excluded. 4. Mild diffuse thickened appearance of the colon, likely related to underdistention. Colitis is less likely but not excluded clinical correlation is recommended. No bowel obstruction. 5.  Aortic Atherosclerosis (ICD10-I70.0). Electronically Signed   By: Vanetta Chou M.D.   On: 01/13/2024 16:52   CT Head Wo Contrast Result Date: 01/13/2024 CLINICAL DATA:  Altered level of consciousness, pancreatic cancer EXAM: CT HEAD WITHOUT CONTRAST TECHNIQUE: Contiguous axial images were obtained from the base of the skull through the vertex without intravenous contrast. RADIATION DOSE REDUCTION: This exam was performed according to the departmental dose-optimization program which includes automated exposure control, adjustment of the mA and/or kV according to patient size and/or use of iterative reconstruction technique. COMPARISON:  None Available. FINDINGS: Brain: There are indeterminate hypodensities within the right frontal subcortical white matter, right parietal and occipital periventricular white matter, and left occipital cortex, most consistent with age indeterminate ischemic changes. Chronic appearing ischemic changes are seen within the cerebellar hemispheres and left occipital cortex. No evidence of acute hemorrhage. Lateral ventricles and remaining midline structures are unremarkable. No acute extra-axial fluid collections. No mass effect. Vascular: No hyperdense vessel or unexpected calcification. Diffuse atherosclerosis. Skull: Normal. Negative for fracture or focal lesion. Sinuses/Orbits: No acute finding. Other: None. IMPRESSION: 1. Indeterminate hypodensities within the right frontal, parietal, and occipital white matter as above, favor age indeterminate small vessel ischemic changes. 2. Chronic appearing ischemic changes within the  cerebellar hemispheres and left occipital cortex. 3. No evidence of acute hemorrhage. Electronically Signed   By: Ozell Daring M.D.   On: 01/13/2024 16:49    Impression:   Sepsis with gram-negative bacteremia, severe, manifested by ongoing pressor-requiring hypotension, leukocytosis, fevers, coagulopathy, lactic acidosis compounded by tachypnea. Most consistent cholangitis. Recent diagnosis of obstructive pancreatic cancer s/p stent placement Dr. Rosalie July 2nd. Imaging shows pneumobilia of left lobe of liver but very scant pneumobilia of right lobe, I wonder if there is incomplete biliary drainage to right hepatic lobe from stent or tumor.  Likely also may have developing liver abscesses.  Plan:   IV fluids, pressors, antibiotics, supportive care for sepsis. FFP to correct coagulopathy Emergent ERCP today with Dr. Saintclair to assess for stent patency and adequacy of biliary drainage. Patient is critically ill and ERCP today imparts higher-than-average risks, but without it we won't know whether or not the biliary stent is not adequately functioning. Risks (up to and including bleeding, infection, perforation, pancreatitis that can be complicated by infected necrosis and death), benefits (removal of stones, alleviating blockage, decreasing risk of cholangitis or choledocholithiasis-related pancreatitis), and alternatives (watchful waiting, percutaneous transhepatic cholangiography) of ERCP were explained to patient/family in detail and patient elects to proceed.  [facility] will follow.   LOS: 1 day   Phyllis Abelson M  01/14/2024, 10:32 AM  Cell [phone] If no answer or after 5 PM call [phone]

## 2024-01-14 NOTE — Anesthesia Procedure Notes (Signed)
 Procedure Name: Intubation Date/Time: 01/14/2024 3:10 PM  Performed by: Christopher Comings, CRNAPre-anesthesia Checklist: Patient identified, Emergency Drugs available, Suction available and Patient being monitored Patient Re-evaluated:Patient Re-evaluated prior to induction Oxygen  Delivery Method: Circle system utilized Preoxygenation: Pre-oxygenation with 100% oxygen  Induction Type: IV induction Ventilation: Mask ventilation without difficulty Laryngoscope Size: Mac and 4 Grade View: Grade I Tube type: Oral Tube size: 7.0 mm Number of attempts: 1 Airway Equipment and Method: Stylet and Oral airway Placement Confirmation: ETT inserted through vocal cords under direct vision, positive ETCO2 and breath sounds checked- equal and bilateral Secured at: 22 cm Tube secured with: Tape Dental Injury: Teeth and Oropharynx as per pre-operative assessment

## 2024-01-14 NOTE — Progress Notes (Addendum)
 PHARMACY - PHYSICIAN COMMUNICATION CRITICAL VALUE ALERT - BLOOD CULTURE IDENTIFICATION (BCID)  Lindsay Hardy is an 73 y.o. female who presented to [facility] on 01/13/2024 with a chief complaint of sepsis and pancreatic adenocarcinoma with biliary obstruction c/o biliary source.  Assessment:  BCID with Enterococcus faecalis (not VRE) and Ecoli. WBC up 27.7. Tm 103. ST.   Name of physician (or Provider) Contacted: Dr. Zaida  Current antibiotics: Zosyn  and Vancomycin   Changes to prescribed antibiotics recommended:  Recommendations accepted by provider - Stopping Vanc. Continue Zosyn  for now. Follow-up sensitivities  Results for orders placed or performed during the hospital encounter of 01/13/24  Blood Culture ID Panel (Reflexed) (Collected: 01/13/2024  4:02 PM)  Result Value Ref Range   Enterococcus faecalis DETECTED (A) NOT DETECTED   Enterococcus Faecium NOT DETECTED NOT DETECTED   Listeria monocytogenes NOT DETECTED NOT DETECTED   Staphylococcus species NOT DETECTED NOT DETECTED   Staphylococcus aureus (BCID) NOT DETECTED NOT DETECTED   Staphylococcus epidermidis NOT DETECTED NOT DETECTED   Staphylococcus lugdunensis NOT DETECTED NOT DETECTED   Streptococcus species NOT DETECTED NOT DETECTED   Streptococcus agalactiae NOT DETECTED NOT DETECTED   Streptococcus pneumoniae NOT DETECTED NOT DETECTED   Streptococcus pyogenes NOT DETECTED NOT DETECTED   A.calcoaceticus-baumannii NOT DETECTED NOT DETECTED   Bacteroides fragilis NOT DETECTED NOT DETECTED   Enterobacterales DETECTED (A) NOT DETECTED   Enterobacter cloacae complex NOT DETECTED NOT DETECTED   Escherichia coli DETECTED (A) NOT DETECTED   Klebsiella aerogenes NOT DETECTED NOT DETECTED   Klebsiella oxytoca NOT DETECTED NOT DETECTED   Klebsiella pneumoniae NOT DETECTED NOT DETECTED   Proteus species NOT DETECTED NOT DETECTED   Salmonella species NOT DETECTED NOT DETECTED   Serratia marcescens NOT DETECTED NOT DETECTED    Haemophilus influenzae NOT DETECTED NOT DETECTED   Neisseria meningitidis NOT DETECTED NOT DETECTED   Pseudomonas aeruginosa NOT DETECTED NOT DETECTED   Stenotrophomonas maltophilia NOT DETECTED NOT DETECTED   Candida albicans NOT DETECTED NOT DETECTED   Candida auris NOT DETECTED NOT DETECTED   Candida glabrata NOT DETECTED NOT DETECTED   Candida krusei NOT DETECTED NOT DETECTED   Candida parapsilosis NOT DETECTED NOT DETECTED   Candida tropicalis NOT DETECTED NOT DETECTED   Cryptococcus neoformans/gattii NOT DETECTED NOT DETECTED   CTX-M ESBL NOT DETECTED NOT DETECTED   Carbapenem resistance IMP NOT DETECTED NOT DETECTED   Carbapenem resistance KPC NOT DETECTED NOT DETECTED   Carbapenem resistance NDM NOT DETECTED NOT DETECTED   Carbapenem resist OXA 48 LIKE NOT DETECTED NOT DETECTED   Vancomycin  resistance NOT DETECTED NOT DETECTED   Carbapenem resistance VIM NOT DETECTED NOT DETECTED    Ciro Harlene Daring 01/14/2024  7:41 AM

## 2024-01-14 NOTE — Progress Notes (Signed)
 NAME:  Lindsay Hardy, MRN:  [id number], DOB:  1951/04/14, LOS: 1 ADMISSION DATE:  01/13/2024, CONSULTATION DATE:  01/14/2024 REFERRING MD:  EDP, CHIEF COMPLAINT:  Sepsis    History of Present Illness:  73 year old female with a history of CVA metabolic syndrome , pancreatic adenocarcinoma on chemotherapy and morbid obesity who eveloped tachycardia and altered mental status after completing chemotherapy. She was transported to the emergency room where she developed fever and hypotension and admitted to the ICU severe septic shock   LA was 6.7 >>> 4.9 WBC was 12.4  Imaging  CT Abd/pelvis - Concerns  of colitis with a hypoenhancing area around the liver  CT Angio - No acute pathology  CT head - Indeterminate hypodensities within the right frontal, parietal, and occipital white matter as above, favor age indeterminate small vessel ischemic changes. 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 Avram   Granulomatous disease, chronic (HCC)     in right upper lobe per x-ray December 2005   H/O bilateral cataract extraction    Hyperlipidemia    Hypertension    NAFLD (nonalcoholic fatty liver disease)     based on ultrasound June 2002, elevated transaminase   OA (osteoarthritis)     DJP coracoclavicular ligament, left  patella osteophytes June 2002, L4-S1 spondylosis, degenerative disc disease with bulge. I in October 2001, the C2-C4 spondylosis without stenosis and with spurs based on x-ray July 2005; diffuse idiopathic skeletal hyperostosis with bilateral hip lumbar degenerative changes   on x-ray February 2005, bilateral plantar calcaneal spurs   Reactive airway disease     peak flow 180 in May 2005, chronic sinusitis with PND, bronchitis December 2002 and strep pneumonia April 2007,  FEC 66, MCV 60, ratio is 69, DLCO 57,  moderate restrictive and mild obstructive disease  on spirometry May 2005     Significant Hospital Events: Including procedures, antibiotic start and stop dates in addition to other pertinent events   8/7 : Admitted to ICU  8/8 : Planned ERCP   Interim History / Subjective:  Laying in bed in no acute distress, reports she is tired , otherwise denies any chest pain or shortness of breath   Objective   Blood pressure (!) 154/82, pulse (!) 134, temperature 98.4 F (36.9 C), temperature source Axillary, resp. rate (!) 43, height 5' 5 (1.651 m), weight 107.2 kg, SpO2 100%.        Intake/Output Summary (Last 24 hours) at 01/14/2024 1044 Last data filed at 01/14/2024 0600 Gross per 24 hour  Intake 3783.78 ml  Output 1320 ml  Net 2463.78 ml   Filed Weights   01/13/24 2140 01/14/24 0500  Weight: 106.8 kg 107.2 kg    Examination: General: adult overweight  female in NAD HENT: [place]/AT, PERRL, no JVD Lungs: Clear bilateral breath sounds Cardiovascular: Tachy, regular, no MRG Abdomen: Soft, NT, ND Extremities: No acute deformity or ROM limitation Neuro: Alert, oriented, non-focal  Resolved Hospital Problem list     Assessment & Plan:  Infectious Disease: Bacteremia ( E.Coli) Concerns for colitis  Septic shock Lactic Acidosis - Worsening white count 10 >> 27 - Afebrile this morning  but remain tachypneic and tachycardic  - Blood Cx growing Ecoli - ID consulted  - Continue  Zosyn  for now , awaiting ID recs  - Treat 7-14 days for gram negative source control - Treat 14 days for gram positive bacteremia after source control  - Treat MRSA, treat for 6-4 weeks after source control   Cardiovascular: - Elevated troponin - Trops 150 > 210 ,likely type 2 NSTEMI, EKG reviewed and reassuring  - Trend troponin  Pulm / Resp: Hx of Asthma  On RA sating well. No active concerns  - On brovana  and Pulmicort    Gastrointestinal: - Adenocarcinoma of the pancreas with biliary obstruction: -  ERCP and stent placed 12/08/2023 - Repeat ERCP per GI at 1 pm today  - F/U on GI recs post ERCP - Appreciate Dr Cloretta, Arley NOVAK recs - Monitor for sign of pancreatitis post op    Endocrine: DM - A1c 10.1 a month ago  - Blood glucose of 140 this morning  - Hold home glipizide , metformin  in the setting of Lactic acidosis    - CBG monitoring and SSI  Hematologic: Microcytic anaemia, Hgb 7.7 - Pending ERCP today - Will monitor for signs of bleeding - Transfuse hgb <7  - Give FFP prior to procedure  - Lovenox   - Iron panel   Electrolytes Hypokalemia - Replete K ,rapid infusion  - Repeat BMP at noon   Best Practice (right click and Reselect all SmartList Selections daily)   Diet/type: NPO DVT prophylaxis: not indicated GI prophylaxis: N/A Lines: N/A Foley:  Yes, and it is still needed Code Status:  full code Last date of multidisciplinary goals of care discussion [Per above]  Labs   CBC: Recent Labs  Lab 01/13/24 0915 01/13/24 1601 01/14/24 0500  WBC 12.4* 10.9* 27.7*  NEUTROABS 9.2* 7.4  --   HGB 9.6* 11.5* 7.7*  HCT 29.1* 36.2 23.9*  MCV 74.6* 75.9* 76.1*  PLT 547* 575* 296    Basic Metabolic Panel: Recent Labs  Lab 01/13/24 0915 01/13/24 1601 01/14/24 0500  NA 132* 133* 131*  K 3.2* 3.6 2.7*  CL 94* 94* 99  CO2 26 18* 14*  GLUCOSE 363* 262* 327*  BUN 16 17 13   CREATININE 0.96 1.14* 1.28*  CALCIUM  8.7* 9.0 6.5*  MG  --   --  1.2*  PHOS   --   --  3.8   GFR: Estimated Creatinine Clearance: 46.9 mL/min (A) (by C-G formula based on SCr of 1.28 mg/dL (H)). Recent Labs  Lab 01/13/24 0915 01/13/24 1601 01/13/24 1602 01/13/24 1802 01/14/24 0500  WBC 12.4* 10.9*  --   --  27.7*  LATICACIDVEN  --   --  6.7* 4.9*  --     Liver Function Tests: Recent Labs  Lab 01/13/24 0915 01/13/24 1601 01/14/24 0500  AST 46* 230* 354*  ALT 66* 114* 141*  ALKPHOS 385* 693* 331*  BILITOT 1.0 2.1* 2.8*  PROT 6.5 7.2 5.4*  ALBUMIN  2.7* 2.8* <1.5*   Recent Labs  Lab 01/13/24 1601  LIPASE 12   No results for  input(s): AMMONIA in the last 168 hours.  ABG No results found for: PHART, PCO2ART, PO2ART, HCO3, TCO2, ACIDBASEDEF, O2SAT   Coagulation Profile: Recent Labs  Lab 01/14/24 0500  INR 2.0*    Cardiac Enzymes: No results for input(s): CKTOTAL, CKMB, CKMBINDEX, TROPONINI in the last 168 hours.  HbA1C: Hgb A1c MFr Bld  Date/Time Value Ref Range Status  11/23/2023 02:59 PM 10.1 (H) 4.6 - 6.5 % Final    Comment:    Glycemic Control Guidelines for People with Diabetes:Non Diabetic:  <6%Goal of Therapy: <7%Additional Action Suggested:  >8%   07/19/2023 03:41 PM 7.0 (H) 4.6 - 6.5 % Final    Comment:    Glycemic Control Guidelines for People with Diabetes:Non Diabetic:  <6%Goal of Therapy: <7%Additional Action Suggested:  >8%     CBG: Recent Labs  Lab 01/13/24 2141 01/13/24 2308 01/14/24 0335 01/14/24 0750  GLUCAP 183* 189* 142* 140*    Review of Systems:     Past Medical History:  She,  has a past medical history of Asthma, CAD (coronary artery disease) (11/07/2003), Cancer (HCC), Chicken pox, CVA (cerebrovascular accident) (HCC) (06/09/1999), Diabetes mellitus, Diastolic dysfunction, Empty sella syndrome (HCC), GERD (gastroesophageal reflux disease), Granulomatous disease, chronic (HCC), H/O bilateral cataract extraction, Hyperlipidemia, Hypertension, NAFLD (nonalcoholic fatty liver  disease), OA (osteoarthritis), and Reactive airway disease.   Surgical History:   Past Surgical History:  Procedure Laterality Date   BILIARY BRUSHING  12/08/2023   Procedure: BRUSH BIOPSY, BILE DUCT;  Surgeon: Rosalie Kitchens, MD;  Location[facility] ENDOSCOPY;  Service: Gastroenterology;;   BILIARY STENT PLACEMENT  12/08/2023   Procedure: INSERTION, STENT, BILE DUCT;  Surgeon: Rosalie Kitchens, MD;  Location: [facility] ENDOSCOPY;  Service: Gastroenterology;;   ORIN MEDIATE RELEASE  1992    left arm 1992   CHOLECYSTECTOMY  2003   ERCP N/A 12/08/2023   Procedure: ERCP, WITH INTERVENTION IF INDICATED;  Surgeon: Rosalie Kitchens, MD;  Location: [facility] ENDOSCOPY;  Service: Gastroenterology;  Laterality: N/A;   PORTACATH PLACEMENT Left 12/27/2023   Procedure: INSERTION, TUNNELED CENTRAL VENOUS DEVICE, WITH PORT;  Surgeon: Dasie Leonor CROME, MD;  Location: [facility] OR;  Service: General;  Laterality: Left;  PORT-A-CATH INSERTION   TONSILLECTOMY AND ADENOIDECTOMY       Social History:   reports that she has quit smoking. Her smoking use included cigarettes. She has a 1 pack-year smoking history. She has never used smokeless tobacco. She reports that she does not drink alcohol  and does not use drugs.   Family History:  Her family history includes Hypertension in her mother and sister; Lung cancer in her mother and sister.   Allergies Allergies  Allergen Reactions   Gabapentin  Shortness Of Breath   Telmisartan  Shortness Of Breath, Swelling and Other (See Comments)    Shortness of breath, foot swelling   Clopidogrel Bisulfate Other (See Comments)    REACTION: weak, tired, sleepy, tightness (6/05); soreness in hip (11/05)   Crestor  [Rosuvastatin  Calcium ] Other (See Comments)    Did not like how she felt on it - stomach upset, nausea   Furosemide Other (See Comments)    REACTION: did not tolerate and preferred HCTZ   Lisinopril Other (See Comments)    REACTION: cough, right sided pain   Metoprolol Tartrate Other (See Comments)     REACTION: lightheadedness, h/a cough (7/04); heart gong to stop (7/06); sluggishnes and depressed feeling (2/07); weakness/heaviness 6/07)   Mometasone Furoate Other (See Comments)    REACTION: neck tenderness   Ozempic  (0.25 Or 0.5 Mg-Dose) [  Semaglutide (0.25 Or 0.5mg -Dos)] Other (See Comments)    Blurry vision     Home Medications  Prior to Admission medications   Medication Sig Start Date End Date Taking? Authorizing Provider  albuterol  (VENTOLIN  HFA) 108 (90 Base) MCG/ACT inhaler INHALE 2 PUFFS INTO THE LUNGS EVERY 4 (FOUR) HOURS AS NEEDED FOR SHORTNESS OF BREATH. 03/12/23   Geofm Glade PARAS, MD  amLODipine  (NORVASC ) 10 MG tablet Take 1 tablet (10 mg total) by mouth daily. 03/12/23   Geofm Glade PARAS, MD  cyanocobalamin  (VITAMIN B12) 500 MCG tablet Take 500 mcg by mouth daily.    [provider]  fluticasone  (FLONASE ) 50 MCG/ACT nasal spray Place 2 sprays into both nostrils daily. 03/12/23   Geofm Glade PARAS, MD  glipiZIDE  (GLUCOTROL ) 10 MG tablet TAKE 2 TABLETS BY MOUTH 2 TIMES DAILY BEFORE A MEAL. 03/12/23   Geofm Glade PARAS, MD  hydrochlorothiazide  (HYDRODIURIL ) 25 MG tablet Take 1 tablet (25 mg total) by mouth daily. 11/23/23   Geofm Glade PARAS, MD  HYDROcodone -acetaminophen  (NORCO) 10-325 MG tablet Take 1-2 tablets by mouth every 4 (four) hours as needed for moderate pain (pain score 4-6) or severe pain (pain score 7-10). 12/13/23   Patsy Lenis, MD  ibuprofen  (ADVIL ) 800 MG tablet TAKE 1 TABLET BY MOUTH TWICE DAILY AS NEEDED FOR MODERATE PAIN 11/30/23   Geofm Glade PARAS, MD  insulin  glargine (LANTUS ) 100 UNIT/ML Solostar Pen Inject 20 Units into the skin daily. 11/30/23   Geofm Glade PARAS, MD  lidocaine -prilocaine  (EMLA ) cream Apply 1 Application topically as needed (Apply to Port 1-2 hours prior to its use start using with 2nd treatment). Patient not taking: No sig reported 12/24/23   Cloretta Arley NOVAK, MD  linaclotide  (LINZESS ) 145 MCG CAPS capsule TAKE 1 CAPSULE BY MOUTH DAILY BEFORE  BREAKFAST. Patient not taking: Reported on 12/07/2023 03/12/23   Geofm Glade PARAS, MD  metFORMIN  (GLUCOPHAGE -XR) 500 MG 24 hr tablet TAKE 2 TABLETS BY MOUTH TWICE DAILY BEFORE A MEAL Patient taking differently: Take 500 mg by mouth 2 (two) times daily with a meal. TAKE 2 TABLETS BY MOUTH TWICE DAILY BEFORE A MEAL 09/23/23   Burns, Glade PARAS, MD  montelukast  (SINGULAIR ) 10 MG tablet TAKE 1 TABLET BY MOUTH EVRY DAY AT BEDTIME 09/23/23   Geofm Glade PARAS, MD  naloxone  (NARCAN ) nasal spray 4 mg/0.1 mL 1 spray in one nostril x1; may repeat dose in alternate nostrils q2-3min prn Patient not taking: No sig reported 03/07/23   Geofm Glade PARAS, MD  ondansetron  (ZOFRAN ) 8 MG tablet Take 1 tablet (8 mg total) by mouth every 8 (eight) hours as needed. Patient not taking: No sig reported 12/24/23   Cloretta Arley NOVAK, MD  potassium chloride  (MICRO-K ) 10 MEQ CR capsule TAKE 1 CAPSULE BY MOUTH TWICE A DAY 06/07/23   Geofm Glade PARAS, MD  prochlorperazine  (COMPAZINE ) 10 MG tablet Take 1 tablet (10 mg total) by mouth every 6 (six) hours as needed for nausea or vomiting. Patient not taking: No sig reported 12/24/23   Cloretta Arley NOVAK, MD     Critical care time: 37 min      Drue Grow MD [facility] Medicine Program - PGY-2 01/14/2024, 10:44 AM Pager# [phone]

## 2024-01-14 NOTE — Anesthesia Preprocedure Evaluation (Addendum)
 Anesthesia Evaluation  Patient identified by MRN, date of birth, ID band Patient awake    Reviewed: Allergy & Precautions, NPO status , Patient's Chart, lab work & pertinent test results  Airway Mallampati: IV  TM Distance: >3 FB Neck ROM: Full    Dental  (+) Edentulous Upper, Edentulous Lower   Pulmonary asthma , former smoker    + decreased breath sounds unstable     Cardiovascular hypertension (98/53, hypotensive in ICU on and off peripheral levophed ), Pt. on medications + CAD and + Peripheral Vascular Disease   Rhythm:Regular Rate:Tachycardia  Echo today:  1. Left ventricular ejection fraction, by estimation, is 50 to 55%. The  left ventricle has low normal function. The left ventricle has no regional  wall motion abnormalities. There is mild left ventricular hypertrophy.  Left ventricular diastolic  parameters are indeterminate.   2. Right ventricular systolic function is normal. The right ventricular  size is normal.   3. The mitral valve is normal in structure. Trivial mitral valve  regurgitation.   4. The aortic valve is tricuspid. Aortic valve regurgitation is not  visualized. Aortic valve sclerosis is present, with no evidence of aortic  valve stenosis.    Trop 210 up from 150, thought to be 2/2 demand ischemia    Neuro/Psych CVA  negative psych ROS   GI/Hepatic Neg liver ROS,GERD  Controlled,,Sepsis thought to be 2/2 obstructed biliary stent, placed about a month ago for pancreatic ca   Endo/Other  diabetes, Well Controlled, Type 2, Oral Hypoglycemic Agents  BMI 39  Renal/GU Renal InsufficiencyRenal disease (cr 1.28)K 2.7 this AM  negative genitourinary   Musculoskeletal  (+) Arthritis , Osteoarthritis,    Abdominal  (+) + obese Abdomen: soft.   Peds  Hematology  (+) Blood dyscrasia, anemia Hb 7.7   Anesthesia Other Findings Ozempic   Reproductive/Obstetrics negative OB ROS                               Anesthesia Physical Anesthesia Plan  ASA: 4 and emergent  Anesthesia Plan: General   Post-op Pain Management:    Induction: Intravenous and Rapid sequence  PONV Risk Score and Plan: Ondansetron , Treatment may vary due to age or medical condition and Midazolam   Airway Management Planned: Oral ETT  Additional Equipment: Arterial line and Ultrasound Guidance Line Placement  Intra-op Plan:   Post-operative Plan: Post-operative intubation/ventilation  Informed Consent: I have reviewed the patients History and Physical, chart, labs and discussed the procedure including the risks, benefits and alternatives for the proposed anesthesia with the patient or authorized representative who has indicated his/her understanding and acceptance.     Dental advisory given  Plan Discussed with: CRNA  Anesthesia Plan Comments: (Pt septic 2/2 obstructed biliary stent, critically ill. Poor access- will place central line and arterial line prior to induction. K 2.7 and Hb 7.7 this morning, on repeat labs after line placement K now 3.4 and Hb 8.8. Did receive FFP in ICU before transport down. Respiratory status has been unstable since admission. In preop, pt is slightly hypotensive and tachycardic, breathing is extremely labored.   D/w ICU physician Dr. Zaida and the patient that she will be returning intubated with lines. D/w patient that she is critically ill, but the likelihood that she will survive without this procedure is slim. She still wishes to be full code.   )         Anesthesia Quick Evaluation

## 2024-01-14 NOTE — Progress Notes (Signed)
 Patient is critically ill.  I have spoken with Dr. Zaida (PCCM) and the anesthesia team.  Based on my discussion with Dr. Zaida, patient is as well optimized as possible without addressing root cause (suspected biliary sepsis).  As such; we will proceed with ERCP today.  PCCM is replacing potassium and anesthesia will obtain more IV access, and we're giving FFP.  I have spoken with patient's son, Ariyon Gerstenberger [phone]) about risks/benefits of procedure.  I have reviewed that there is relative increase risk in anesthesia complications (protracted intubation, cardiac arrest) in addition to the expected ERCP risks (bleeding, perforation, pancreatitis).  However, without having this procedure, it is likely that patient's condition will continue to deteriorate.  I have also reviewed CODE status with patient's son.  Patient's son relays that patient is to remain FULL CODE at this time.  Dr. Saintclair is planning on performing ERCP later this afternoon.  Continue IV fluids, antibiotics, pressor support as needed for now.  [facility] will follow.

## 2024-01-14 NOTE — Op Note (Signed)
 [facility] Patient Name: Lindsay Hardy Procedure Date : 01/14/2024 MRN: [id number] Attending MD: Estelita Manas , MD, [id number] Date of Birth: 1950/09/19 CSN: [id number] Age: 73 Admit Type: Inpatient Procedure:                ERCP Indications:              For therapy of ascending cholangitis, History of                            pancreatic cancer, s/p metal stent in CBD,                            hypotension/tachycardia/tachypnea-septic shock,                            elevated LFTs, Ct suspicious for liver abscesses Providers:                Estelita Manas, MD, Jacquelyn Jaci Pierce, RN, Felice Sar, Technician Referring MD:             ICU team Medicines:                General Anesthesia Complications:            No immediate complications. Estimated Blood Loss:     Estimated blood loss: none. Procedure:                Pre-Anesthesia Assessment:                           - Prior to the procedure, a History and Physical                            was performed, and patient medications and                            allergies were reviewed. The patient's tolerance of                            previous anesthesia was also reviewed. The risks                            and benefits of the procedure and the sedation                            options and risks were discussed with the patient.                            All questions were answered, and informed consent                            was obtained. Prior Anticoagulants: The patient has                            taken  Lovenox  (enoxaparin ), last dose was day of                            procedure. ASA Grade Assessment: IV - A patient                            with severe systemic disease that is a constant                            threat to life. After reviewing the risks and                            benefits, the patient was deemed in satisfactory                             condition to undergo the procedure.                           After obtaining informed consent, the scope was                            passed under direct vision. Throughout the                            procedure, the patient's blood pressure, pulse, and                            oxygen  saturations were monitored continuously. The                            [id number]-[id number] ([id number]) Olympus duodenoscope was                            introduced through the mouth, and used to inject                            contrast into and used to inject contrast into the                            bile duct. The ERCP was somewhat difficult due to                            the patient's medical instability. The patient                            tolerated the procedure well. Scope In: Scope Out: Findings:      A biliary stent was visible on the scout film. The esophagus was       successfully intubated under direct vision. The scope was advanced to a       normal major papilla in the descending duodenum without detailed       examination of the pharynx, larynx and associated structures, and upper       GI tract. The upper GI tract was grossly normal.  The stent appeared completely occluded with food debris and mucous.      The bile duct was deeply cannulated with the sphincterotome. Contrast       was injected. I personally interpreted the bile duct images. The flow of       contrast through the ducts was poor. A straight Roadrunner wire was       passed into the biliary tree.      The biliary tree was swept with a 9 mm and a 12 mm balloon starting at       the lower third of the main duct.      However, the tip of the balloon could not be advanced into the proximal       CBD.      A moderate amount of pus was swept from the duct.      As patient had received lovenox  on the day of the procedure, had an INR       of 2 and oozing was noted from the ampulla with minimal trauma, I       decided not  to removed the occluded stent.      One 7 Fr by 7 cm plastic stent with a single external flap and a single       internal flap was placed 6.5 cm into the common bile duct. Pus flowed       through the stent. The stent was in good position. Impression:               - The biliary tree was swept and pus was found.                           - One plastic stent was placed into the common bile                            duct. Moderate Sedation:      Patient did not receive moderate sedation for this procedure, but       instead received monitored anesthesia care. Recommendation:           - Advance diet as tolerated.                           - Follow LFTs and clinical course.                           - If there is no improvement noted, patient may                            need removal of plastic and metal stent and                            reinsertion of a new stent, depending on INR and                            clinical status. Procedure Code(s):        --- Professional ---                           [phone], Endoscopic retrograde  cholangiopancreatography (ERCP); with placement of                            endoscopic stent into biliary or pancreatic duct,                            including pre- and post-dilation and guide wire                            passage, when performed, including sphincterotomy,                            when performed, each stent                           [id number], Endoscopic catheterization of the biliary                            ductal system, radiological supervision and                            interpretation Diagnosis Code(s):        --- Professional ---                           K83.09, Other cholangitis CPT copyright 2022 American Medical Association. All rights reserved. The codes documented in this report are preliminary and upon coder review may  be revised to meet current compliance requirements. Estelita Manas,  MD 01/14/2024 4:17:45 PM This report has been signed electronically. Number of Addenda: 0

## 2024-01-14 NOTE — Anesthesia Procedure Notes (Signed)
 Central Venous Catheter Insertion Performed by: Merla Almarie HERO, DO, anesthesiologist Start/End8/01/2024 2:40 PM, 01/14/2024 2:45 PM Patient location: Pre-op. Preanesthetic checklist: patient identified, IV checked, site marked, risks and benefits discussed, surgical consent, monitors and equipment checked, pre-op evaluation, timeout performed and anesthesia consent Position: Trendelenburg Lidocaine  1% used for infiltration and patient sedated Hand hygiene performed , maximum sterile barriers used  and Seldinger technique used Catheter size: 8 Fr Total catheter length 16. Central line was placed.Double lumen Procedure performed using ultrasound guided technique. Ultrasound Notes:anatomy identified, needle tip was noted to be adjacent to the nerve/plexus identified, no ultrasound evidence of intravascular and/or intraneural injection and image(s) printed for medical record Attempts: 1 Following insertion, dressing applied, line sutured and Biopatch. Post procedure assessment: blood return through all ports  Patient tolerated the procedure well with no immediate complications.

## 2024-01-14 NOTE — Consult Note (Addendum)
 [facility]    Date of Admission:  01/13/2024     Total days of antibiotics 1                 Reason for Consult: Enterococcal bacteremia, E coli bacteremia     Referring Provider: Auto consult Primary Care Provider: Geofm Glade PARAS, MD   Assessment: Lindsay Hardy is a 73 y.o. female admitted with:   Polymicrobial Bacteremia -  E Coli, Enterococcus Faecalis -  Only one set of cultures were obtained before antibiotics initiated. Likely GI source/translocation with tumor - CT scan also with concern over colitis findings, though not having clinical symptoms of that.  History of ERCP and stent placed 12/08/2023 - Planning ERCP today with Dr. Saintclair with GI.  Liver on CT with 3. Fatty liver. Several rounded hypoenhancing areas within the liver may represent post treatment changes and areas of infarct. Abscesses are less likely but not excluded --> could be concerning for endocarditis.  - continue with zosyn  for now - hopefully can switch to unasyn  after sensitivities to e coli back.  - fu TTE --> likely needs TEE evaluation next week esp in light of possible liver infarcts.   Pancreatic Cancer stage Ib (cT2c N0) -  She is currently undergoing neoadjuvant chemotherapy. She completed a cycle of gemcitabine /Abraxane  yesterday and is under the care of Dr. Cloretta.   Port a Cath Placement -  Placed with gen surgery on 12/27/23.  Would recommend removal with the enterococcus in the blood as this has higher risk for seeding.   Diabetes -  Uncontrolled with A1c 10.1    Plan: FU TTE - likely need TEE next week  FU ERCP findings Repeat blood cultures in am Zosyn  is reasonable for now - covers both  Follow further susceptibility data  Would recommend port to be removed given bacteremia with enterococcus when she is able - Dr. Fleeta Rothman d/w Dr. Cloretta this recommendation   Principal Problem:   Bacteremia Active Problems:   Septic shock (HCC)   Shock (HCC)    Polymicrobial bacterial infection    sodium chloride    Intravenous Once   arformoterol   15 mcg Nebulization BID   budesonide  (PULMICORT ) nebulizer solution  0.25 mg Nebulization BID   Chlorhexidine  Gluconate Cloth  6 each Topical Daily   enoxaparin  (LOVENOX ) injection  40 mg Subcutaneous Q24H   insulin  aspart  0-15 Units Subcutaneous Q4H   sodium chloride  flush  10-40 mL Intracatheter Q12H    HPI: Lindsay Hardy is a 73 y.o. female admitted with tachycardia and AMS during chemotherapy administration.   She was not feeling well lately with her last chemo. Feels like the stent was not working to help her symptoms.  She was not having a fever at the time of her chemo. Felt very weak.  She is going for ERCP today with Dr. Saintclair.  TTE scheduled today for evaluation.  CT Abd/pelvis - Concerns  of colitis with a hypoenhancing area around the liver  CT Angio - No acute findings  CT head - Indeterminate hypodensities within the right frontal, parietal, and occipital white matter as above, favor age indeterminate small vessel ischemic changes.  Port placed recently for chemo access - placed 12/27/23 by Dr. Dasie with surgery team   Review of Systems: Review of Systems  Constitutional:  Positive for chills and fever.  Respiratory:  Negative for cough.   Cardiovascular:  Negative for chest pain and leg swelling.  Gastrointestinal:  Positive for abdominal pain. Negative for diarrhea, nausea and vomiting.  Genitourinary: Negative.     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)     based on MRI September 2001, related to obesity,  hypertension, it was determined that patient has not required treatment but will be monitored with TSH, ACTH, cortisol, testosterone, prolactin, growth hormone   GERD (gastroesophageal reflux disease)     EGD June 2004 -  followed by Dr. Avram   Granulomatous disease, chronic (HCC)     in right upper lobe per x-ray December 2005   H/O bilateral cataract extraction    Hyperlipidemia    Hypertension    NAFLD (nonalcoholic fatty liver disease)     based on ultrasound June 2002, elevated transaminase   OA (osteoarthritis)     DJP coracoclavicular ligament, left patella osteophytes June 2002, L4-S1 spondylosis, degenerative disc disease with bulge. I in October 2001, the C2-C4 spondylosis without stenosis and with spurs based on x-ray July 2005; diffuse idiopathic skeletal hyperostosis with bilateral hip lumbar degenerative changes   on x-ray February 2005, bilateral plantar calcaneal spurs   Reactive airway disease     peak flow 180 in May 2005, chronic sinusitis with PND, bronchitis December 2002 and strep pneumonia April 2007,  FEC 66, MCV 60, ratio is 69, DLCO 57,  moderate restrictive and mild obstructive disease  on spirometry May 2005    Social History   Tobacco Use   Smoking status: Former    Current packs/day: 0.25    Average packs/day: 0.3 packs/day for 4.0 years (1.0 ttl pk-yrs)    Types: Cigarettes   Smokeless tobacco: Never   Tobacco comments:    smoked in 20's   Vaping Use   Vaping status: Never Used  Substance Use Topics   Alcohol  use: No    Alcohol /week: 0.0 standard drinks of alcohol    Drug use: No    Family History  Problem Relation Age of Onset   Lung cancer Mother    Hypertension Mother    Lung cancer Sister    Hypertension Sister    Allergies  Allergen Reactions   Gabapentin  Shortness Of Breath   Telmisartan  Shortness Of Breath, Swelling and Other (See Comments)    Shortness of breath, foot swelling   Clopidogrel Bisulfate Other (See Comments)     REACTION: weak, tired, sleepy, tightness (6/05); soreness in hip (11/05)   Crestor  [Rosuvastatin  Calcium ] Other (See Comments)    Did not like how she felt on it - stomach upset, nausea   Furosemide Other (See Comments)    REACTION: did not tolerate and preferred HCTZ   Lisinopril Other (See Comments)    REACTION: cough, right sided pain   Metoprolol Tartrate Other (See Comments)    REACTION: lightheadedness, h/a cough (7/04); heart gong to stop (7/06); sluggishnes and depressed feeling (2/07); weakness/heaviness 6/07)   Mometasone Furoate Other (See Comments)    REACTION: neck tenderness   Ozempic  (0.25 Or 0.5 Mg-Dose) [Semaglutide (0.25 Or 0.5mg -Dos)] Other (See Comments)    Blurry vision  OBJECTIVE: Blood pressure (!) 154/82, pulse (!) 134, temperature 98.4 F (36.9 C), temperature source Axillary, resp. rate (!) 43, height 5' 5 (1.651 m), weight 107.2 kg, SpO2 100%.  Physical Exam HENT:     Mouth/Throat:     Comments: Some white coating on lips, not on tongue. No erythematous plaques noted on oral mucosal surfaces.  Pulmonary:     Effort: Pulmonary effort is normal.     Comments: Sitting up high fowlers. Breathing is rapid/labored. Abdominal:     General: There is distension.     Tenderness: There is abdominal tenderness in the epigastric area.  Skin:    General: Skin is warm.     Capillary Refill: Capillary refill takes less than 2 seconds.  Neurological:     Mental Status: She is oriented to person, place, and time.     Lab Results Lab Results  Component Value Date   WBC 27.7 (H) 01/14/2024   HGB 7.7 (L) 01/14/2024   HCT 23.9 (L) 01/14/2024   MCV 76.1 (L) 01/14/2024   PLT 296 01/14/2024    Lab Results  Component Value Date   CREATININE 1.28 (H) 01/14/2024   BUN 13 01/14/2024   NA 131 (L) 01/14/2024   K 2.7 (LL) 01/14/2024   CL 99 01/14/2024   CO2 14 (L) 01/14/2024    Lab Results  Component Value Date   ALT 141 (H) 01/14/2024   AST 354 (H) 01/14/2024    ALKPHOS 331 (H) 01/14/2024   BILITOT 2.8 (H) 01/14/2024     Microbiology: Recent Results (from the past 240 hours)  Blood culture (routine x 2)     Status: None (Preliminary result)   Collection Time: 01/13/24  4:02 PM   Specimen: BLOOD LEFT FOREARM  Result Value Ref Range Status   Specimen Description   Final    BLOOD LEFT FOREARM Performed at [facility], [address], [place], [place] [address]    Special Requests   Final    Blood Culture adequate volume BOTTLES DRAWN AEROBIC AND ANAEROBIC Performed at [facility], [address], [place], [place] [address]    Culture  Setup Time   Final    GRAM NEGATIVE RODS GRAM POSITIVE COCCI IN BOTH AEROBIC AND ANAEROBIC BOTTLES CRITICAL RESULT CALLED TO, READ BACK BY AND VERIFIED WITH: PHARMD G ABBOTT 01/14/2024 0650 BY AB Performed at [facility], [address] [place], [place] [address]    Culture VONNE NEGATIVE RODS GRAM POSITIVE COCCI   Final   Report Status PENDING  Incomplete  Blood Culture ID Panel (Reflexed)     Status: Abnormal   Collection Time: 01/13/24  4:02 PM  Result Value Ref Range Status   Enterococcus faecalis DETECTED (A) NOT DETECTED Final    Comment: CRITICAL RESULT CALLED TO, READ BACK BY AND VERIFIED WITH: PHARMD G ABBOTT 01/14/2024 0650 BY AB    Enterococcus Faecium NOT DETECTED NOT DETECTED Final   Listeria monocytogenes NOT DETECTED NOT DETECTED Final   Staphylococcus species NOT DETECTED NOT DETECTED Final   Staphylococcus aureus (BCID) NOT DETECTED NOT DETECTED Final   Staphylococcus epidermidis NOT DETECTED NOT DETECTED Final   Staphylococcus lugdunensis NOT DETECTED NOT DETECTED Final   Streptococcus species NOT DETECTED NOT DETECTED Final   Streptococcus agalactiae NOT DETECTED NOT DETECTED Final   Streptococcus pneumoniae NOT DETECTED NOT DETECTED Final   Streptococcus pyogenes NOT DETECTED NOT DETECTED Final   A.calcoaceticus-baumannii NOT DETECTED NOT DETECTED  Final   Bacteroides  fragilis NOT DETECTED NOT DETECTED Final   Enterobacterales DETECTED (A) NOT DETECTED Final    Comment: Enterobacterales represent a large order of gram negative bacteria, not a single organism. CRITICAL RESULT CALLED TO, READ BACK BY AND VERIFIED WITH: PHARMD G ABBOTT 01/14/2024 0650 BY AB    Enterobacter cloacae complex NOT DETECTED NOT DETECTED Final   Escherichia coli DETECTED (A) NOT DETECTED Final    Comment: CRITICAL RESULT CALLED TO, READ BACK BY AND VERIFIED WITH: PHARMD G ABBOTT 01/14/2024 0650 BY AB    Klebsiella aerogenes NOT DETECTED NOT DETECTED Final   Klebsiella oxytoca NOT DETECTED NOT DETECTED Final   Klebsiella pneumoniae NOT DETECTED NOT DETECTED Final   Proteus species NOT DETECTED NOT DETECTED Final   Salmonella species NOT DETECTED NOT DETECTED Final   Serratia marcescens NOT DETECTED NOT DETECTED Final   Haemophilus influenzae NOT DETECTED NOT DETECTED Final   Neisseria meningitidis NOT DETECTED NOT DETECTED Final   Pseudomonas aeruginosa NOT DETECTED NOT DETECTED Final   Stenotrophomonas maltophilia NOT DETECTED NOT DETECTED Final   Candida albicans NOT DETECTED NOT DETECTED Final   Candida auris NOT DETECTED NOT DETECTED Final   Candida glabrata NOT DETECTED NOT DETECTED Final   Candida krusei NOT DETECTED NOT DETECTED Final   Candida parapsilosis NOT DETECTED NOT DETECTED Final   Candida tropicalis NOT DETECTED NOT DETECTED Final   Cryptococcus neoformans/gattii NOT DETECTED NOT DETECTED Final   CTX-M ESBL NOT DETECTED NOT DETECTED Final   Carbapenem resistance IMP NOT DETECTED NOT DETECTED Final   Carbapenem resistance KPC NOT DETECTED NOT DETECTED Final   Carbapenem resistance NDM NOT DETECTED NOT DETECTED Final   Carbapenem resist OXA 48 LIKE NOT DETECTED NOT DETECTED Final   Vancomycin  resistance NOT DETECTED NOT DETECTED Final   Carbapenem resistance VIM NOT DETECTED NOT DETECTED Final    Comment: Performed at [facility]  Lab, [address] [place], [place] [address]  Resp panel by RT-PCR (RSV, Flu A&B, Covid) Anterior Nasal Swab     Status: None   Collection Time: 01/13/24  5:28 PM   Specimen: Anterior Nasal Swab  Result Value Ref Range Status   SARS Coronavirus 2 by RT PCR NEGATIVE NEGATIVE Final    Comment: (NOTE) SARS-CoV-2 target nucleic acids are NOT DETECTED.  The SARS-CoV-2 RNA is generally detectable in upper respiratory specimens during the acute phase of infection. The lowest concentration of SARS-CoV-2 viral copies this assay can detect is 138 copies/mL. A negative result does not preclude SARS-Cov-2 infection and should not be used as the sole basis for treatment or other patient management decisions. A negative result may occur with  improper specimen collection/handling, submission of specimen other than nasopharyngeal swab, presence of viral mutation(s) within the areas targeted by this assay, and inadequate number of viral copies(<138 copies/mL). A negative result must be combined with clinical observations, patient history, and epidemiological information. The expected result is Negative.  Fact Sheet for Patients:  BloggerCourse.com  Fact Sheet for Healthcare Providers:  SeriousBroker.it  This test is no t yet approved or cleared by the [place]  FDA and  has been authorized for detection and/or diagnosis of SARS-CoV-2 by FDA under an Emergency Use Authorization (EUA). This EUA will remain  in effect (meaning this test can be used) for the duration of the COVID-19 declaration under Section 564(b)(1) of the Act, 21 U.S.C.section 360bbb-3(b)(1), unless the authorization is terminated  or revoked sooner.       Influenza A by PCR NEGATIVE NEGATIVE  Final   Influenza B by PCR NEGATIVE NEGATIVE Final    Comment: (NOTE) The Xpert Xpress SARS-CoV-2/FLU/RSV plus assay is intended as an aid in the diagnosis of influenza from  Nasopharyngeal swab specimens and should not be used as a sole basis for treatment. Nasal washings and aspirates are unacceptable for Xpert Xpress SARS-CoV-2/FLU/RSV testing.  Fact Sheet for Patients: BloggerCourse.com  Fact Sheet for Healthcare Providers: SeriousBroker.it  This test is not yet approved or cleared by the [place]  FDA and has been authorized for detection and/or diagnosis of SARS-CoV-2 by FDA under an Emergency Use Authorization (EUA). This EUA will remain in effect (meaning this test can be used) for the duration of the COVID-19 declaration under Section 564(b)(1) of the Act, 21 U.S.C. section 360bbb-3(b)(1), unless the authorization is terminated or revoked.     Resp Syncytial Virus by PCR NEGATIVE NEGATIVE Final    Comment: (NOTE) Fact Sheet for Patients: BloggerCourse.com  Fact Sheet for Healthcare Providers: SeriousBroker.it  This test is not yet approved or cleared by the [place]  FDA and has been authorized for detection and/or diagnosis of SARS-CoV-2 by FDA under an Emergency Use Authorization (EUA). This EUA will remain in effect (meaning this test can be used) for the duration of the COVID-19 declaration under Section 564(b)(1) of the Act, 21 U.S.C. section 360bbb-3(b)(1), unless the authorization is terminated or revoked.  Performed at [facility], [address], [place], [place] [address]   MRSA Next Gen by PCR, Nasal     Status: None   Collection Time: 01/13/24  9:32 PM   Specimen: Nasal Mucosa; Nasal Swab  Result Value Ref Range Status   MRSA by PCR Next Gen NOT DETECTED NOT DETECTED Final    Comment: (NOTE) The GeneXpert MRSA Assay (FDA approved for NASAL specimens only), is one component of a comprehensive MRSA colonization surveillance program. It is not intended to diagnose MRSA infection nor to guide or  monitor treatment for MRSA infections. Test performance is not FDA approved in patients less than 29 years old. Performed at [facility], [address], [place], [place] [address]     Corean Fireman, MSN, NP-C [facility] Pager: [phone]  01/14/2024 10:08 AM    Total Encounter Time: 20 m

## 2024-01-14 NOTE — Progress Notes (Signed)
 [facility] ADULT ICU REPLACEMENT PROTOCOL   The patient does apply for the [facility] Adult ICU Electrolyte Replacment Protocol based on the criteria listed below:   1.Exclusion criteria: TCTS, ECMO, Dialysis, and Myasthenia Gravis patients 2. Is GFR >/= 30 ml/min? Yes.    Patient's GFR today is 44 3. Is SCr </= 2? Yes.   Patient's SCr is 1.28 mg/dL 4. Did SCr increase >/= 0.5 in 24 hours? No. 5.Pt's weight >40kg  Yes.   6. Abnormal electrolyte(s): K+ 2.7, Mag 1.2  7. Electrolytes replaced per protocol 8.  Call MD STAT for K+ </= 2.5, Phos </= 1, or Mag </= 1 Physician:  Haze Ole BIRCH [facility] 01/14/2024 6:52 AM

## 2024-01-14 NOTE — Progress Notes (Signed)
 NAME:  Lindsay Hardy, MRN:  [id number], DOB:  1951-01-29, LOS: 1 ADMISSION DATE:  01/13/2024, CONSULTATION DATE:  01/14/24 REFERRING MD:  EDP , CHIEF COMPLAINT:     History of Present Illness:  73 year old female with past medical history of CVA, diabetes, hypertension, hyperlipidemia, diverticulosis, obesity who was recently diagnosed with pancreatic cancer status post 2 cycles of gemcitabine /Abraxane  (cycle 2 on 01/13/2024).  Presented to the ER after second cycle infusion with tachycardia.  Family at the time stated that she was feeling cold.  In the ER she was noted to have a heart rate of 150.  Laboratory workup at that time showed mild leukocytosis 10.9, mild LFT elevation (alk phos elevated at 693).  And a lactic acid of 6.7.  CT head, chest, abdomen and pelvis performed which showed evidence of colitis with a hypoenhancing area around the liver.   In the ER the patient became more hypotensive despite aggressive resuscitation with 4 L of IV fluids.  She was started on Levophed  and admitted to the ICU.  She was started on broad-spectrum antibiotics with Vanco and Zosyn .  Overnight her blood cultures grew E. coli and E faecalis, these organisms likely represent a GI source.  GI already consulted.  Pertinent  Medical History  has a past medical history of Asthma, CAD (coronary artery disease) (11/07/2003), Cancer (HCC), Chicken pox, CVA (cerebrovascular accident) (HCC) (06/09/1999), Diabetes mellitus, Diastolic dysfunction, Empty sella syndrome (HCC), GERD (gastroesophageal reflux disease), Granulomatous disease, chronic (HCC), H/O bilateral cataract extraction, Hyperlipidemia, Hypertension, NAFLD (nonalcoholic fatty liver disease), OA (osteoarthritis), and Reactive airway disease.   Significant Hospital Events: Including procedures, antibiotic start and stop dates in addition to other pertinent events   Blood cultures growing E fecalis and E coli Going for ERCP 8/8  Interim History / Subjective:   Patient feels weak but otherwise has no complaints.   Objective    Blood pressure (!) 154/82, pulse (!) 134, temperature 98.4 F (36.9 C), temperature source Axillary, resp. rate (!) 43, height 5' 5 (1.651 m), weight 107.2 kg, SpO2 100%.        Intake/Output Summary (Last 24 hours) at 01/14/2024 0809 Last data filed at 01/14/2024 0600 Gross per 24 hour  Intake 3783.78 ml  Output 1320 ml  Net 2463.78 ml   Filed Weights   01/13/24 2140 01/14/24 0500  Weight: 106.8 kg 107.2 kg    Examination: General: Ill appearing female not in distress  Lungs:  CTAB Cardiovascular: RRR , NL s1,s2  Abdomen: Soft, NT, ND  Extremities: Warm Neuro: Intact    Resolved problem list   Assessment and Plan    Neuro #History of stroke no issuess   Cardiovascular #Septic shock #CAD # Type II NSTEMI (flat trops) #Hyperlipidemia #Hypertension Likely from GI source   Pulmonary #History of asthma -Brovana  and Pulmicort  nebs Patient tachypnic likely compensating from metabolic acidosis. I suspect that if she goes for ERCP she might come back to the unit intubated   GI #Concern for biliary source of infection #Concern for colitis on CT (surgery less concerned) # Transaminitis # Pancreatic cancer  - ERCP versus MRCP - Continue to trend liver enzymes - Strict NPO   ID # Septic shock # Bacteremia with E. coli and E faecalis - Continue Zosyn , DC Vanco (plan for 7 days from source control) - Appreciate ID recommendations   Renal # AKI # Hypokalemia # Hypomagnesemia  Heme-onc #Leukocytosis in the setting of infection #Anemia with low MCV, likely due to anemia of chronic  disease -Check iron panel - DVT prophylaxis with heparin  - Plan for 1 unit FFP prior to ERCP   Endo  SSI  Hold glipizide  and metformin     Best Practice (right click and Reselect all SmartList Selections daily)   Diet/type: NPO DVT prophylaxis LMWH Pressure ulcer(s): N/A GI prophylaxis: N/A Lines:  N/A Foley:  N/A Code Status:  full code Last date of multidisciplinary goals of care discussion [NA]  Labs   CBC: Recent Labs  Lab 01/13/24 0915 01/13/24 1601 01/14/24 0500  WBC 12.4* 10.9* 27.7*  NEUTROABS 9.2* 7.4  --   HGB 9.6* 11.5* 7.7*  HCT 29.1* 36.2 23.9*  MCV 74.6* 75.9* 76.1*  PLT 547* 575* 296    Basic Metabolic Panel: Recent Labs  Lab 01/13/24 0915 01/13/24 1601 01/14/24 0500  NA 132* 133* 131*  K 3.2* 3.6 2.7*  CL 94* 94* 99  CO2 26 18* 14*  GLUCOSE 363* 262* 327*  BUN 16 17 13   CREATININE 0.96 1.14* 1.28*  CALCIUM  8.7* 9.0 6.5*  MG  --   --  1.2*  PHOS  --   --  3.8   GFR: Estimated Creatinine Clearance: 46.9 mL/min (A) (by C-G formula based on SCr of 1.28 mg/dL (H)). Recent Labs  Lab 01/13/24 0915 01/13/24 1601 01/13/24 1602 01/13/24 1802 01/14/24 0500  WBC 12.4* 10.9*  --   --  27.7*  LATICACIDVEN  --   --  6.7* 4.9*  --     Liver Function Tests: Recent Labs  Lab 01/13/24 0915 01/13/24 1601 01/14/24 0500  AST 46* 230* 354*  ALT 66* 114* 141*  ALKPHOS 385* 693* 331*  BILITOT 1.0 2.1* 2.8*  PROT 6.5 7.2 5.4*  ALBUMIN  2.7* 2.8* <1.5*   Recent Labs  Lab 01/13/24 1601  LIPASE 12   No results for input(s): AMMONIA in the last 168 hours.  ABG No results found for: PHART, PCO2ART, PO2ART, HCO3, TCO2, ACIDBASEDEF, O2SAT   Coagulation Profile: Recent Labs  Lab 01/14/24 0500  INR 2.0*    Cardiac Enzymes: No results for input(s): CKTOTAL, CKMB, CKMBINDEX, TROPONINI in the last 168 hours.  HbA1C: Hgb A1c MFr Bld  Date/Time Value Ref Range Status  11/23/2023 02:59 PM 10.1 (H) 4.6 - 6.5 % Final    Comment:    Glycemic Control Guidelines for People with Diabetes:Non Diabetic:  <6%Goal of Therapy: <7%Additional Action Suggested:  >8%   07/19/2023 03:41 PM 7.0 (H) 4.6 - 6.5 % Final    Comment:    Glycemic Control Guidelines for People with Diabetes:Non Diabetic:  <6%Goal of Therapy: <7%Additional Action  Suggested:  >8%     CBG: Recent Labs  Lab 01/13/24 2141 01/13/24 2308 01/14/24 0335 01/14/24 0750  GLUCAP 183* 189* 142* 140*    Review of Systems:   Patient complaining of shortness of breath and leg pain. ROS otherwise negative   Past Medical History:  She,  has a past medical history of Asthma, CAD (coronary artery disease) (11/07/2003), Cancer (HCC), Chicken pox, CVA (cerebrovascular accident) (HCC) (06/09/1999), Diabetes mellitus, Diastolic dysfunction, Empty sella syndrome (HCC), GERD (gastroesophageal reflux disease), Granulomatous disease, chronic (HCC), H/O bilateral cataract extraction, Hyperlipidemia, Hypertension, NAFLD (nonalcoholic fatty liver disease), OA (osteoarthritis), and Reactive airway disease.   Surgical History:   Past Surgical History:  Procedure Laterality Date   BILIARY BRUSHING  12/08/2023   Procedure: BRUSH BIOPSY, BILE DUCT;  Surgeon: Rosalie Kitchens, MD;  Location: [facility] ENDOSCOPY;  Service: Gastroenterology;;   BILIARY STENT PLACEMENT  12/08/2023  Procedure: INSERTION, STENT, BILE DUCT;  Surgeon: Rosalie Kitchens, MD;  Location: [facility] ENDOSCOPY;  Service: Gastroenterology;;   ORIN MEDIATE RELEASE  1992    left arm 1992   CHOLECYSTECTOMY  2003   ERCP N/A 12/08/2023   Procedure: ERCP, WITH INTERVENTION IF INDICATED;  Surgeon: Rosalie Kitchens, MD;  Location: [facility] ENDOSCOPY;  Service: Gastroenterology;  Laterality: N/A;   PORTACATH PLACEMENT Left 12/27/2023   Procedure: INSERTION, TUNNELED CENTRAL VENOUS DEVICE, WITH PORT;  Surgeon: Dasie Leonor CROME, MD;  Location: [facility] OR;  Service: General;  Laterality: Left;  PORT-A-CATH INSERTION   TONSILLECTOMY AND ADENOIDECTOMY       Social History:   reports that she has quit smoking. Her smoking use included cigarettes. She has a 1 pack-year smoking history. She has never used smokeless tobacco. She reports that she does not drink alcohol  and does not use drugs.   Family History:  Her family history includes Hypertension in her mother  and sister; Lung cancer in her mother and sister.   Allergies Allergies  Allergen Reactions   Gabapentin  Shortness Of Breath   Telmisartan  Shortness Of Breath, Swelling and Other (See Comments)    Shortness of breath, foot swelling   Clopidogrel Bisulfate Other (See Comments)    REACTION: weak, tired, sleepy, tightness (6/05); soreness in hip (11/05)   Crestor  [Rosuvastatin  Calcium ] Other (See Comments)    Did not like how she felt on it - stomach upset, nausea   Furosemide Other (See Comments)    REACTION: did not tolerate and preferred HCTZ   Lisinopril Other (See Comments)    REACTION: cough, right sided pain   Metoprolol Tartrate Other (See Comments)    REACTION: lightheadedness, h/a cough (7/04); heart gong to stop (7/06); sluggishnes and depressed feeling (2/07); weakness/heaviness 6/07)   Mometasone Furoate Other (See Comments)    REACTION: neck tenderness   Ozempic  (0.25 Or 0.5 Mg-Dose) [Semaglutide (0.25 Or 0.5mg -Dos)] Other (See Comments)    Blurry vision     Home Medications  Prior to Admission medications   Medication Sig Start Date End Date Taking? Authorizing Provider  albuterol  (VENTOLIN  HFA) 108 (90 Base) MCG/ACT inhaler INHALE 2 PUFFS INTO THE LUNGS EVERY 4 (FOUR) HOURS AS NEEDED FOR SHORTNESS OF BREATH. 03/12/23   Geofm Glade PARAS, MD  amLODipine  (NORVASC ) 10 MG tablet Take 1 tablet (10 mg total) by mouth daily. 03/12/23   Geofm Glade PARAS, MD  cyanocobalamin  (VITAMIN B12) 500 MCG tablet Take 500 mcg by mouth daily.    [provider]  fluticasone  (FLONASE ) 50 MCG/ACT nasal spray Place 2 sprays into both nostrils daily. 03/12/23   Geofm Glade PARAS, MD  glipiZIDE  (GLUCOTROL ) 10 MG tablet TAKE 2 TABLETS BY MOUTH 2 TIMES DAILY BEFORE A MEAL. 03/12/23   Geofm Glade PARAS, MD  hydrochlorothiazide  (HYDRODIURIL ) 25 MG tablet Take 1 tablet (25 mg total) by mouth daily. 11/23/23   Geofm Glade PARAS, MD  HYDROcodone -acetaminophen  (NORCO) 10-325 MG tablet Take 1-2 tablets by mouth  every 4 (four) hours as needed for moderate pain (pain score 4-6) or severe pain (pain score 7-10). 12/13/23   Patsy Lenis, MD  ibuprofen  (ADVIL ) 800 MG tablet TAKE 1 TABLET BY MOUTH TWICE DAILY AS NEEDED FOR MODERATE PAIN 11/30/23   Geofm Glade PARAS, MD  insulin  glargine (LANTUS ) 100 UNIT/ML Solostar Pen Inject 20 Units into the skin daily. 11/30/23   Geofm Glade PARAS, MD  lidocaine -prilocaine  (EMLA ) cream Apply 1 Application topically as needed (Apply to Port 1-2 hours prior to its use  start using with 2nd treatment). Patient not taking: No sig reported 12/24/23   Cloretta Arley NOVAK, MD  linaclotide  (LINZESS ) 145 MCG CAPS capsule TAKE 1 CAPSULE BY MOUTH DAILY BEFORE BREAKFAST. Patient not taking: Reported on 12/07/2023 03/12/23   Geofm Glade PARAS, MD  metFORMIN  (GLUCOPHAGE -XR) 500 MG 24 hr tablet TAKE 2 TABLETS BY MOUTH TWICE DAILY BEFORE A MEAL Patient taking differently: Take 500 mg by mouth 2 (two) times daily with a meal. TAKE 2 TABLETS BY MOUTH TWICE DAILY BEFORE A MEAL 09/23/23   Burns, Glade PARAS, MD  montelukast  (SINGULAIR ) 10 MG tablet TAKE 1 TABLET BY MOUTH EVRY DAY AT BEDTIME 09/23/23   Geofm Glade PARAS, MD  naloxone  (NARCAN ) nasal spray 4 mg/0.1 mL 1 spray in one nostril x1; may repeat dose in alternate nostrils q2-3min prn Patient not taking: No sig reported 03/07/23   Geofm Glade PARAS, MD  ondansetron  (ZOFRAN ) 8 MG tablet Take 1 tablet (8 mg total) by mouth every 8 (eight) hours as needed. Patient not taking: No sig reported 12/24/23   Cloretta Arley NOVAK, MD  potassium chloride  (MICRO-K ) 10 MEQ CR capsule TAKE 1 CAPSULE BY MOUTH TWICE A DAY 06/07/23   Geofm Glade PARAS, MD  prochlorperazine  (COMPAZINE ) 10 MG tablet Take 1 tablet (10 mg total) by mouth every 6 (six) hours as needed for nausea or vomiting. Patient not taking: No sig reported 12/24/23   Cloretta Arley NOVAK, MD     Critical care time: 20    The patient is critically ill due to septic shock from a GI source.  Critical care was necessary to treat or  prevent imminent or life-threatening deterioration. Critical care time was spent by me on the following activities: development of a treatment plan with the patient and/or surrogate as well as nursing, discussions with consultants, evaluation of the patient's response to treatment, examination of the patient, obtaining a history from the patient or surrogate, ordering and performing treatments and interventions, ordering and review of laboratory studies, ordering and review of radiographic studies, review of telemetry data including pulse oximetry, re-evaluation of patient's condition and participation in multidisciplinary rounds.   I personally spent 35 minutes providing critical care not including any separately billable procedures.   Zola LOISE Herter, MD  Pulmonary Critical Care 01/14/2024 3:24 PM

## 2024-01-14 NOTE — Progress Notes (Addendum)
 eLink Physician-Brief Progress Note Patient Name: Lindsay Hardy DOB: 11/18/1950 MRN: [id number]   Date of Service  01/14/2024  HPI/Events of Note  73 year old female with history of CVA metabolic syndrome, morbid obesity and pancreatic adenocarcinoma on chemotherapy who is admitted to the ICU with septic shock thought to be secondary to a biliary source.   Patient's latest BMP shows K of 4.2.  Propofol  is at a fixed dose  eICU Interventions  Complete remaining runs of potassium  Adjust propofol  to be titratable.   9387 - standard retention protocol, bladder scan 408cc  Intervention Category Minor Interventions: Routine modifications to care plan (e.g. PRN medications for pain, fever)  Erron Wengert 01/14/2024, 8:41 PM

## 2024-01-14 NOTE — Progress Notes (Signed)
  Echocardiogram 2D Echocardiogram has been performed.  Wells Gerdeman 01/14/2024, 12:14 PM

## 2024-01-14 NOTE — Progress Notes (Signed)
 IP PROGRESS NOTE  Subjective:   Ms. Nealis has a history of pancreas cancer.  She is currently undergoing neoadjuvant chemotherapy.  She completed a cycle of gemcitabine /Abraxane  yesterday.  She developed tachycardia and altered mental status after completing chemotherapy.  She was transported to the emergency room where she developed fever and hypotension.  She was admitted with sepsis syndrome. She has no specific complaint this morning.  She denies fever at home.  No nausea.  Objective: Vital signs in last 24 hours: Blood pressure (!) 86/69, pulse (!) 123, temperature 100 F (37.8 C), temperature source Oral, resp. rate (!) 27, height 5' 5 (1.651 m), weight 236 lb 5.3 oz (107.2 kg), SpO2 100%.  Intake/Output from previous day: 08/07 0701 - 08/08 0700 In: 3783.8 [I.V.:523.6; IV Piggyback:3260.2] Out: 1320 [Urine:1320]  Physical Exam:  HEENT: No thrush Lungs: Inspiratory rhonchi at the right upper anterior chest, no respiratory distress Cardiac: Regular rate and rhythm with premature beats, tachycardia Abdomen: No hepatosplenomegaly, soft, mild tenderness in the right upper abdomen Extremities: No leg edema Neurologic: Alert and oriented, follows commands  Portacath/PICC-without erythema  Lab Results: Recent Labs    01/13/24 1601 01/14/24 0500  WBC 10.9* 27.7*  HGB 11.5* 7.7*  HCT 36.2 23.9*  PLT 575* 296    BMET Recent Labs    01/13/24 0915 01/13/24 1601  NA 132* 133*  K 3.2* 3.6  CL 94* 94*  CO2 26 18*  GLUCOSE 363* 262*  BUN 16 17  CREATININE 0.96 1.14*  CALCIUM  8.7* 9.0    Lab Results  Component Value Date   CAN199 <2 12/09/2023    Studies/Results: CT Angio Chest PE W and/or Wo Contrast Result Date: 01/13/2024 CLINICAL DATA:  Chest and abdominal pain. Pancreatic adenocarcinoma. Concern for pulmonary embolism. EXAM: CT ANGIOGRAPHY CHEST CT ABDOMEN AND PELVIS WITH CONTRAST TECHNIQUE: Multidetector CT imaging of the chest was performed using the standard  protocol during bolus administration of intravenous contrast. Multiplanar CT image reconstructions and MIPs were obtained to evaluate the vascular anatomy. Multidetector CT imaging of the abdomen and pelvis was performed using the standard protocol during bolus administration of intravenous contrast. RADIATION DOSE REDUCTION: This exam was performed according to the departmental dose-optimization program which includes automated exposure control, adjustment of the mA and/or kV according to patient size and/or use of iterative reconstruction technique. CONTRAST:  OMNIPAQUE  IOHEXOL  350 MG/ML SOLN COMPARISON:  Chest CT dated 12/09/2023 and CT abdomen pelvis dated 12/07/2023. FINDINGS: Evaluation of this exam is limited due to respiratory motion. CTA CHEST FINDINGS Cardiovascular: There is no cardiomegaly or pericardial effusion. There is 3 vessel coronary vascular calcification. Mild atherosclerotic calcification of the thoracic aorta. No aneurysmal dilatation or dissection. Evaluation of the pulmonary arteries is limited due to respiratory motion and suboptimal visualization of the peripheral branches. No central pulmonary artery embolus identified. Mediastinum/Nodes: No hilar or mediastinal adenopathy. The esophagus and the thyroid  gland are grossly unremarkable. No mediastinal fluid collection. Right-sided Port-A-Cath tip close to the cavoatrial junction. Lungs/Pleura: Right suprahilar/apical scarring as seen on the prior CT. There is background of emphysema. Small scattered calcified granuloma. No focal consolidation, pleural effusion, or pneumothorax. The central airways are patent. Musculoskeletal: Degenerative changes of the spine. No acute osseous pathology. Review of the MIP images confirms the above findings. CT ABDOMEN and PELVIS FINDINGS No intra-abdominal free air or free fluid. Hepatobiliary: Fatty liver. Several rounded hypoenhancing areas within the liver measuring up to 3.3 x 2.8 cm may represent  post treatment changes and areas  of infarct. There is looping abscesses are less likely but not excluded. There is mild dilatation, post cholecystectomy. Interval placement of a Wallstent in the common bile duct. There is mild pneumobilia. Pancreas: Pancreatic atrophy, progressed since the prior CT. Ill-defined hypoenhancing area in the uncinate process of the pancreas is poorly visualized and suboptimally evaluated due to respiratory motion. Spleen: Normal in size without focal abnormality. Adrenals/Urinary Tract: The adrenal glands are unremarkable. There is no hydronephrosis on either side there is symmetric enhancement and excretion of contrast by both kidneys. The visualized ureters and urinary bladder appear unremarkable. Stomach/Bowel: Mild diffuse thickened appearance of the colon, likely related to underdistention. Colitis is less likely but not excluded clinical correlation is recommended. There is no bowel obstruction. The appendix is not visualized with certainty. No inflammatory changes identified in the right lower quadrant. Vascular/Lymphatic: Mild aortoiliac atherosclerotic disease. The IVC is unremarkable. No portal venous gas. No adenopathy. Reproductive: Calcified uterine fibroids. No suspicious adnexal masses. Other: None Musculoskeletal: Degenerative changes of the spine. No acute osseous pathology. Review of the MIP images confirms the above findings. IMPRESSION: 1. No acute intrathoracic pathology. No central pulmonary artery embolus identified. 2. Interval placement of a Wallstent in the common bile duct. 3. Fatty liver. Several rounded hypoenhancing areas within the liver may represent post treatment changes and areas of infarct. Abscesses are less likely but not excluded. 4. Mild diffuse thickened appearance of the colon, likely related to underdistention. Colitis is less likely but not excluded clinical correlation is recommended. No bowel obstruction. 5.  Aortic Atherosclerosis  (ICD10-I70.0). Electronically Signed   By: Vanetta Chou M.D.   On: 01/13/2024 16:52   CT ABDOMEN PELVIS W CONTRAST Result Date: 01/13/2024 CLINICAL DATA:  Chest and abdominal pain. Pancreatic adenocarcinoma. Concern for pulmonary embolism. EXAM: CT ANGIOGRAPHY CHEST CT ABDOMEN AND PELVIS WITH CONTRAST TECHNIQUE: Multidetector CT imaging of the chest was performed using the standard protocol during bolus administration of intravenous contrast. Multiplanar CT image reconstructions and MIPs were obtained to evaluate the vascular anatomy. Multidetector CT imaging of the abdomen and pelvis was performed using the standard protocol during bolus administration of intravenous contrast. RADIATION DOSE REDUCTION: This exam was performed according to the departmental dose-optimization program which includes automated exposure control, adjustment of the mA and/or kV according to patient size and/or use of iterative reconstruction technique. CONTRAST:  OMNIPAQUE  IOHEXOL  350 MG/ML SOLN COMPARISON:  Chest CT dated 12/09/2023 and CT abdomen pelvis dated 12/07/2023. FINDINGS: Evaluation of this exam is limited due to respiratory motion. CTA CHEST FINDINGS Cardiovascular: There is no cardiomegaly or pericardial effusion. There is 3 vessel coronary vascular calcification. Mild atherosclerotic calcification of the thoracic aorta. No aneurysmal dilatation or dissection. Evaluation of the pulmonary arteries is limited due to respiratory motion and suboptimal visualization of the peripheral branches. No central pulmonary artery embolus identified. Mediastinum/Nodes: No hilar or mediastinal adenopathy. The esophagus and the thyroid  gland are grossly unremarkable. No mediastinal fluid collection. Right-sided Port-A-Cath tip close to the cavoatrial junction. Lungs/Pleura: Right suprahilar/apical scarring as seen on the prior CT. There is background of emphysema. Small scattered calcified granuloma. No focal consolidation, pleural  effusion, or pneumothorax. The central airways are patent. Musculoskeletal: Degenerative changes of the spine. No acute osseous pathology. Review of the MIP images confirms the above findings. CT ABDOMEN and PELVIS FINDINGS No intra-abdominal free air or free fluid. Hepatobiliary: Fatty liver. Several rounded hypoenhancing areas within the liver measuring up to 3.3 x 2.8 cm may represent post treatment changes and  areas of infarct. There is looping abscesses are less likely but not excluded. There is mild dilatation, post cholecystectomy. Interval placement of a Wallstent in the common bile duct. There is mild pneumobilia. Pancreas: Pancreatic atrophy, progressed since the prior CT. Ill-defined hypoenhancing area in the uncinate process of the pancreas is poorly visualized and suboptimally evaluated due to respiratory motion. Spleen: Normal in size without focal abnormality. Adrenals/Urinary Tract: The adrenal glands are unremarkable. There is no hydronephrosis on either side there is symmetric enhancement and excretion of contrast by both kidneys. The visualized ureters and urinary bladder appear unremarkable. Stomach/Bowel: Mild diffuse thickened appearance of the colon, likely related to underdistention. Colitis is less likely but not excluded clinical correlation is recommended. There is no bowel obstruction. The appendix is not visualized with certainty. No inflammatory changes identified in the right lower quadrant. Vascular/Lymphatic: Mild aortoiliac atherosclerotic disease. The IVC is unremarkable. No portal venous gas. No adenopathy. Reproductive: Calcified uterine fibroids. No suspicious adnexal masses. Other: None Musculoskeletal: Degenerative changes of the spine. No acute osseous pathology. Review of the MIP images confirms the above findings. IMPRESSION: 1. No acute intrathoracic pathology. No central pulmonary artery embolus identified. 2. Interval placement of a Wallstent in the common bile duct. 3.  Fatty liver. Several rounded hypoenhancing areas within the liver may represent post treatment changes and areas of infarct. Abscesses are less likely but not excluded. 4. Mild diffuse thickened appearance of the colon, likely related to underdistention. Colitis is less likely but not excluded clinical correlation is recommended. No bowel obstruction. 5.  Aortic Atherosclerosis (ICD10-I70.0). Electronically Signed   By: Vanetta Chou M.D.   On: 01/13/2024 16:52   CT Head Wo Contrast Result Date: 01/13/2024 CLINICAL DATA:  Altered level of consciousness, pancreatic cancer EXAM: CT HEAD WITHOUT CONTRAST TECHNIQUE: Contiguous axial images were obtained from the base of the skull through the vertex without intravenous contrast. RADIATION DOSE REDUCTION: This exam was performed according to the departmental dose-optimization program which includes automated exposure control, adjustment of the mA and/or kV according to patient size and/or use of iterative reconstruction technique. COMPARISON:  None Available. FINDINGS: Brain: There are indeterminate hypodensities within the right frontal subcortical white matter, right parietal and occipital periventricular white matter, and left occipital cortex, most consistent with age indeterminate ischemic changes. Chronic appearing ischemic changes are seen within the cerebellar hemispheres and left occipital cortex. No evidence of acute hemorrhage. Lateral ventricles and remaining midline structures are unremarkable. No acute extra-axial fluid collections. No mass effect. Vascular: No hyperdense vessel or unexpected calcification. Diffuse atherosclerosis. Skull: Normal. Negative for fracture or focal lesion. Sinuses/Orbits: No acute finding. Other: None. IMPRESSION: 1. Indeterminate hypodensities within the right frontal, parietal, and occipital white matter as above, favor age indeterminate small vessel ischemic changes. 2. Chronic appearing ischemic changes within the  cerebellar hemispheres and left occipital cortex. 3. No evidence of acute hemorrhage. Electronically Signed   By: Ozell Daring M.D.   On: 01/13/2024 16:49    Medications: I have reviewed the patient's current medications.  Assessment/Plan: Pancreas cancer, stage Ib (cT2c N0) 12/02/2023 CT abdomen/pelvis: Ill-defined hypoattenuating pancreas head/uncinate lesion with pancreatic biliary duct dilation, unchanged adjacent cystic focus in the pancreas uncinate 8, incompletely characterized 1.5 cm hypodense left lower pole and 1.5 cm hypoattenuating focus in the lower pole left kidney 12/07/2023 CT Abdo/pelvis: Dilation of the intra and extrahepatic biliary tree, ill-defined low attenuating area in the region of the uncinate of the pancreas with a small adjacent cyst, no adenopathy 12/07/2023:  MRI abdomen/MRCP: Severe intra and exophytic biliary duct dilation, 3.4 x 2.3 cm mass in the inferior pancreas head and uncinate 8 with effacement of the pancreatic and common ducts, cystic change in the adjacent uncinate 8, mass abuts the central dural SMV with less than 50% contact, fat planes preserved to the SMA, no enlarged lymph nodes, kidneys are normal  12/08/2023 ERCP bile duct brushing-adenocarcinoma 12/09/2023: CT chest-centrilobular emphysema, no evidence of metastatic disease Cycle 1 gemcitabine /Abraxane  12/30/2023 Cycle 2 gemcitabine /Abraxane  01/13/2024  Obstructive jaundice secondary to 1, status post placement of a covered common duct stent 12/08/2023 Diabetes 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 Coli   Ms. Gish has pancreas cancer.  A bile duct stent was placed after she presented  with obstructive jaundice last month.  She is now admitted with sepsis/bacteremia.  I suspect the source of infection is the biliary tract.  She may have early malfunctioning of the biliary stent.  A Port-A-Cath infection is less likely.  The liver enzymes and bilirubin are elevated.  The admission CT reveals hypodense areas in the liver.  These may represent abscesses versus metastases.  Her clinical status appears improved today.  She has microcytic anemia in part secondary to chronic disease, sepsis, and chemotherapy.  The red cells have been microcytic in the past.  She may have a thalassemia variant.  Recommendations: Continue antibiotics, management of sepsis syndrome per critical care medicine Consult gastroenterology Consider interventional radiology aspiration of a liver lesion Please call oncology as needed, I will check on her 01/17/2024    LOS: 1 day   Arley Hof, MD   01/14/2024, 6:26 AM

## 2024-01-14 NOTE — Interval H&P Note (Signed)
 History and Physical Interval Note: 73 year old female with suspected cholangitis, change in mental status, fever, hypotension, elevated LFTs, pancreatic cancer with biliary stent placed about a month ago, gram-negative bacteremia, coagulopathy for ERCP with propofol .  01/14/2024 3:03 PM  [name]  has presented today for ERCP with propofol  with the diagnosis of cholangitis.  The various methods of treatment have been discussed with the patient and family. After consideration of risks, benefits and other options for treatment, the patient has consented to  Procedure(s): ERCP, WITH INTERVENTION IF INDICATED (N/A) as a surgical intervention.  The patient's history has been reviewed, patient examined, no change in status, stable for surgery.  I have reviewed the patient's chart and labs.  Questions were answered to the patient's satisfaction.     Lindsay Hardy

## 2024-01-14 NOTE — Anesthesia Procedure Notes (Signed)
 Arterial Line Insertion Start/End8/01/2024 2:50 PM, 01/14/2024 2:55 PM Performed by: Leopoldo Bruckner, MD, anesthesiologist  Patient location: Pre-op. Preanesthetic checklist: patient identified, IV checked, site marked, risks and benefits discussed, surgical consent, monitors and equipment checked, pre-op evaluation, timeout performed and anesthesia consent Lidocaine  1% used for infiltration radial was placed Catheter size: 20 G Hand hygiene performed  and maximum sterile barriers used   Attempts: 1 Procedure performed using ultrasound guided technique. Ultrasound Notes:anatomy identified, needle tip was noted to be adjacent to the nerve/plexus identified, no ultrasound evidence of intravascular and/or intraneural injection and image(s) printed for medical record Following insertion, dressing applied. Post procedure assessment: normal and unchanged  Patient tolerated the procedure well with no immediate complications.

## 2024-01-14 NOTE — Transfer of Care (Signed)
 Immediate Anesthesia Transfer of Care Note  Patient: Lindsay Hardy  Procedure(s) Performed: ERCP, WITH INTERVENTION IF INDICATED INSERTION, STENT, BILE DUCT  Patient Location: ICU  Anesthesia Type:General  Level of Consciousness: Patient remains intubated per anesthesia plan  Airway & Oxygen  Therapy: Patient re-intubated  Post-op Assessment: Report given to RN and Post -op Vital signs reviewed and stable  Post vital signs: Reviewed and stable  Last Vitals:  Vitals Value Taken Time  BP 140/70 1639  Temp    Pulse 102 1639  Resp 18 01/14/24 16:39  SpO2 98 1639  Vitals shown include unfiled device data.  Last Pain:  Vitals:   01/14/24 1413  TempSrc: Temporal  PainSc: 0-No pain         Complications: There were no known notable events for this encounter.

## 2024-01-14 NOTE — TOC Initial Note (Signed)
 Transition of Care [facility]) - Initial/Assessment Note    Patient Details  Name: Lindsay Hardy MRN: [id number] Date of Birth: Feb 02, 1951  [facility]) CM/SW Contact:    Lauraine FORBES Saa, LCSWA Phone Number: 01/14/2024, 4:05 PM  Clinical Narrative:                  4:05 PM Per bedside RN, patient is expected to remain intubated upon completion of today's procedure. CSW introduced self and role to patient's son, Melvern. Antonio confirmed patient resides at home alone but has an aide assist every day approximately two hours each day. Antonio declined patient history of HH and SNF. Antonio confirmed patient DME Armed forces technical officer, electric wheelchair, rolling walker, cane) history. Per chart review, patient has a PCP and insurance. Patient's preferred pharmacy is [facility] [address] [facility]. TOC will continue to follow and be available to assist.  Expected Discharge Plan: Home/Self Care Barriers to Discharge: Continued Medical Work up   Patient Goals and CMS Choice            Expected Discharge Plan and Services       Living arrangements for the past 2 months: Apartment                                      Prior Living Arrangements/Services Living arrangements for the past 2 months: Apartment Lives with:: Self Patient language and need for interpreter reviewed:: Yes        Need for Family Participation in Patient Care: Yes (Comment) Care giver support system in place?: Yes (comment) Current home services: DME, Other (comment) (Caregiver) Criminal Activity/Legal Involvement Pertinent to Current Situation/Hospitalization: No - Comment as needed  Activities of Daily Living      Permission Sought/Granted Permission sought to share information with : Family Supports Permission granted to share information with : No (Contact information on chart)  Share Information with NAME: Kamile Fassler     Permission granted to share info w Relationship:  Son  Permission granted to share info w Contact Information: [phone]  Emotional Assessment   Attitude/Demeanor/Rapport: Unable to Assess, Intubated (Following Commands or Not Following Commands) Affect (typically observed): Unable to Assess   Alcohol  / Substance Use: Not Applicable Psych Involvement: No (comment)  Admission diagnosis:  Shock (HCC) [R57.9] Septic shock (HCC) [A41.9, R65.21] Sepsis, due to unspecified organism, unspecified whether acute organ dysfunction present [facility]) [A41.9] Patient Active Problem List   Diagnosis Date Noted   Bacteremia 01/14/2024   Polymicrobial bacterial infection 01/14/2024   Septic shock (HCC) 01/13/2024   Shock (HCC) 01/13/2024   Cancer of head of pancreas (HCC) 12/16/2023   Elevated LFTs 12/08/2023   Type 2 diabetes mellitus with hyperglycemia (HCC) 12/08/2023   Obstructive jaundice 12/08/2023   Jaundice 12/07/2023   Elevated lipase 11/23/2023   Abnormal CT scan, gastrointestinal tract 11/23/2023   Diverticulitis of duodenum 10/28/2023   Urinary frequency 10/27/2023   Lower abdominal pain 10/27/2023   Gastroenteritis 07/19/2023   Bilateral low back pain without sciatica 03/23/2023   Asthma 11/04/2022   B12 deficiency 11/03/2022   Vaginal candidiasis 05/06/2022   Statin myopathy 01/15/2022   Aortic atherosclerosis (HCC) 11/05/2020   Arthropathy of lumbar facet joint 02/27/2020   Allergic rhinitis 11/22/2018   External hemorrhoids 10/07/2018   Chronic constipation 10/07/2018   Morbid obesity (HCC) 04/01/2017   PAD (peripheral artery disease) (HCC) 10/29/2016   Chronic back pain 09/23/2016  Diabetic peripheral neuropathy (HCC) 02/05/2016   Dysuria 01/14/2016   Hypokalemia 04/24/2007   Microcytic anemia 06/27/2006   Diabetes (HCC) 06/17/2006   Hyperlipidemia 06/17/2006   Essential hypertension 06/17/2006   Coronary atherosclerosis 06/17/2006   DIASTOLIC DYSFUNCTION 06/17/2006   DISEASE, CEREBROVASCULAR NEC 06/17/2006    Osteoarthritis of both knees 06/17/2006   PCP:  Geofm Glade PARAS, MD Pharmacy:   [facility] [phone] - [place], [place] - [address] AT [facility] [address] [place] [address] Phone: [phone] Fax: [phone]     Social Drivers of Health (SDOH) Social History: SDOH Screenings   Food Insecurity: No Food Insecurity (12/22/2023)  Housing: Low Risk  (12/22/2023)  Transportation Needs: No Transportation Needs (12/22/2023)  Utilities: Not At Risk (12/22/2023)  Alcohol  Screen: Low Risk  (11/18/2023)  Depression (PHQ2-9): Low Risk  (01/13/2024)  Financial Resource Strain: Low Risk  (11/18/2023)  Physical Activity: Inactive (11/18/2023)  Social Connections: Moderately Integrated (12/08/2023)  Stress: No Stress Concern Present (11/18/2023)  Tobacco Use: Medium Risk (01/13/2024)  Health Literacy: Adequate Health Literacy (11/18/2023)   SDOH Interventions:     Readmission Risk Interventions    12/09/2023   11:07 AM  Readmission Risk Prevention Plan  Post Dischage Appt Complete  Medication Screening Complete  Transportation Screening Complete

## 2024-01-15 DIAGNOSIS — D696 Thrombocytopenia, unspecified: Secondary | ICD-10-CM | POA: Diagnosis not present

## 2024-01-15 DIAGNOSIS — R6521 Severe sepsis with septic shock: Secondary | ICD-10-CM | POA: Diagnosis not present

## 2024-01-15 DIAGNOSIS — A419 Sepsis, unspecified organism: Secondary | ICD-10-CM | POA: Diagnosis not present

## 2024-01-15 LAB — BASIC METABOLIC PANEL WITH GFR
Anion gap: 13 (ref 5–15)
BUN: 14 mg/dL (ref 8–23)
CO2: 17 mmol/L — ABNORMAL LOW (ref 22–32)
Calcium: 6.9 mg/dL — ABNORMAL LOW (ref 8.9–10.3)
Chloride: 102 mmol/L (ref 98–111)
Creatinine, Ser: 1.15 mg/dL — ABNORMAL HIGH (ref 0.44–1.00)
GFR, Estimated: 50 mL/min — ABNORMAL LOW (ref >60)
Glucose, Bld: 161 mg/dL — ABNORMAL HIGH (ref 70–99)
Potassium: 3.8 mmol/L (ref 3.5–5.1)
Sodium: 132 mmol/L — ABNORMAL LOW (ref 135–145)

## 2024-01-15 LAB — HEPATIC FUNCTION PANEL
ALT: 136 U/L — ABNORMAL HIGH (ref 0–44)
AST: 191 U/L — ABNORMAL HIGH (ref 15–41)
Albumin: 1.7 g/dL — ABNORMAL LOW (ref 3.5–5.0)
Alkaline Phosphatase: 328 U/L — ABNORMAL HIGH (ref 38–126)
Bilirubin, Direct: 1.4 mg/dL — ABNORMAL HIGH (ref 0.0–0.2)
Indirect Bilirubin: 1.2 mg/dL — ABNORMAL HIGH (ref 0.3–0.9)
Total Bilirubin: 2.6 mg/dL — ABNORMAL HIGH (ref 0.0–1.2)
Total Protein: 5.3 g/dL — ABNORMAL LOW (ref 6.5–8.1)

## 2024-01-15 LAB — CBC WITH DIFFERENTIAL/PLATELET
Basophils Absolute: 0 K/uL (ref 0.0–0.1)
Basophils Relative: 0 %
Eosinophils Absolute: 0 K/uL (ref 0.0–0.5)
Eosinophils Relative: 0 %
HCT: 24.5 % — ABNORMAL LOW (ref 36.0–46.0)
Hemoglobin: 8.1 g/dL — ABNORMAL LOW (ref 12.0–15.0)
Lymphocytes Relative: 4 %
Lymphs Abs: 0.8 K/uL (ref 0.7–4.0)
MCH: 24.8 pg — ABNORMAL LOW (ref 26.0–34.0)
MCHC: 33.1 g/dL (ref 30.0–36.0)
MCV: 75.2 fL — ABNORMAL LOW (ref 80.0–100.0)
Monocytes Absolute: 0.6 K/uL (ref 0.1–1.0)
Monocytes Relative: 3 %
Neutro Abs: 18.2 K/uL — ABNORMAL HIGH (ref 1.7–7.7)
Neutrophils Relative %: 93 %
Platelets: 76 K/uL — ABNORMAL LOW (ref 150–400)
RBC: 3.26 MIL/uL — ABNORMAL LOW (ref 3.87–5.11)
RDW: 17.2 % — ABNORMAL HIGH (ref 11.5–15.5)
WBC: 19.6 K/uL — ABNORMAL HIGH (ref 4.0–10.5)
nRBC: 0 % (ref 0.0–0.2)

## 2024-01-15 LAB — GLUCOSE, CAPILLARY
Glucose-Capillary: 138 mg/dL — ABNORMAL HIGH (ref 70–99)
Glucose-Capillary: 154 mg/dL — ABNORMAL HIGH (ref 70–99)
Glucose-Capillary: 157 mg/dL — ABNORMAL HIGH (ref 70–99)
Glucose-Capillary: 158 mg/dL — ABNORMAL HIGH (ref 70–99)
Glucose-Capillary: 169 mg/dL — ABNORMAL HIGH (ref 70–99)
Glucose-Capillary: 191 mg/dL — ABNORMAL HIGH (ref 70–99)

## 2024-01-15 LAB — PREPARE FRESH FROZEN PLASMA

## 2024-01-15 LAB — BPAM FFP
Blood Product Expiration Date: 202508112359
ISSUE DATE / TIME: 202508081230
Unit Type and Rh: 7300

## 2024-01-15 LAB — TRIGLYCERIDES: Triglycerides: 239 mg/dL — ABNORMAL HIGH

## 2024-01-15 LAB — MAGNESIUM: Magnesium: 2.8 mg/dL — ABNORMAL HIGH (ref 1.7–2.4)

## 2024-01-15 LAB — LACTIC ACID, PLASMA: Lactic Acid, Venous: 2.2 mmol/L (ref 0.5–1.9)

## 2024-01-15 MED ORDER — OXYCODONE HCL 5 MG PO TABS
5.0000 mg | ORAL_TABLET | Freq: Four times a day (QID) | ORAL | Status: DC | PRN
  Administered 2024-01-15: 5 mg via ORAL
  Filled 2024-01-15: qty 1

## 2024-01-15 MED ORDER — INSULIN GLARGINE-YFGN 100 UNIT/ML ~~LOC~~ SOLN
10.0000 [IU] | Freq: Every day | SUBCUTANEOUS | Status: DC
  Filled 2024-01-15: qty 0.1

## 2024-01-15 MED ORDER — DEXMEDETOMIDINE HCL IN NACL 400 MCG/100ML IV SOLN
0.0000 ug/kg/h | INTRAVENOUS | Status: DC
  Administered 2024-01-15: 0.4 ug/kg/h via INTRAVENOUS
  Filled 2024-01-15: qty 100

## 2024-01-15 MED ORDER — POTASSIUM CHLORIDE 10 MEQ/50ML IV SOLN
10.0000 meq | INTRAVENOUS | Status: AC
  Administered 2024-01-15 (×2): 10 meq via INTRAVENOUS
  Filled 2024-01-15 (×2): qty 50

## 2024-01-15 MED ORDER — OXYCODONE HCL 5 MG PO TABS
5.0000 mg | ORAL_TABLET | ORAL | Status: DC | PRN
  Administered 2024-01-15 – 2024-01-16 (×4): 5 mg via ORAL
  Filled 2024-01-15 (×4): qty 1

## 2024-01-15 MED ORDER — INSULIN GLARGINE-YFGN 100 UNIT/ML ~~LOC~~ SOLN
5.0000 [IU] | Freq: Every day | SUBCUTANEOUS | Status: DC
  Administered 2024-01-15: 5 [IU] via SUBCUTANEOUS
  Filled 2024-01-15 (×2): qty 0.05

## 2024-01-15 NOTE — Plan of Care (Signed)

## 2024-01-15 NOTE — Procedures (Signed)
 Extubation Procedure Note  Patient Details:   Name: Lindsay Hardy DOB: [birth year] MRN: [id number]   Airway Documentation:    Vent end date: 01/15/24 Vent end time: 0836   Evaluation  O2 sats: stable throughout Complications: No apparent complications Patient did tolerate procedure well. Bilateral Breath Sounds: Clear, Diminished   Yes, pt could speak post extubation. Pt extubated to [facility]  30% @ 40 l/m .    Ozell KATHEE Blase 01/15/2024, 8:37 AM

## 2024-01-15 NOTE — Consult Note (Signed)
 Reason for Consult:sepsis Referring Physician: Dr. Zaida Carter Franca is an 73 y.o. female.  HPI: The patient is a 73 year old black female with known adenocarcinoma of the pancreatic head.  She recently underwent placement of a Port-A-Cath for chemotherapy.  She presented yesterday with fever jaundice and right upper quadrant pain consistent with cholangitis.  She had a previous biliary stent placed about a month ago.  She underwent ERCP yesterday and was found to have an occluded stent.  She had a plastic stent placed through the previous stent with evacuation of pus.  She is feeling a little bit better today.  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 Avram   Granulomatous disease, chronic (HCC)     in right upper lobe per x-ray December 2005   H/O bilateral cataract extraction    Hyperlipidemia    Hypertension    NAFLD (nonalcoholic fatty liver disease)     based on ultrasound June 2002, elevated transaminase   OA (osteoarthritis)     DJP coracoclavicular ligament, left patella osteophytes June 2002, L4-S1 spondylosis, degenerative disc disease with bulge. I in October 2001, the C2-C4 spondylosis without stenosis and with spurs based on x-ray July 2005; diffuse idiopathic skeletal hyperostosis with bilateral hip lumbar degenerative changes    on x-ray February 2005, bilateral plantar calcaneal spurs   Reactive airway disease     peak flow 180 in May 2005, chronic sinusitis with PND, bronchitis December 2002 and strep pneumonia April 2007,  FEC 66, MCV 60, ratio is 69, DLCO 57,  moderate restrictive and mild obstructive disease  on spirometry May 2005    Past Surgical History:  Procedure Laterality Date   BILIARY BRUSHING  12/08/2023   Procedure: BRUSH BIOPSY, BILE DUCT;  Surgeon: Rosalie Kitchens, MD;  Location: [facility] ENDOSCOPY;  Service: Gastroenterology;;   BILIARY STENT PLACEMENT  12/08/2023   Procedure: INSERTION, STENT, BILE DUCT;  Surgeon: Rosalie Kitchens, MD;  Location: [facility] ENDOSCOPY;  Service: Gastroenterology;;   ORIN MEDIATE RELEASE  1992    left arm 1992   CHOLECYSTECTOMY  2003   ERCP N/A 12/08/2023   Procedure: ERCP, WITH INTERVENTION IF INDICATED;  Surgeon: Rosalie Kitchens, MD;  Location: [facility] ENDOSCOPY;  Service: Gastroenterology;  Laterality: N/A;   PORTACATH PLACEMENT Left 12/27/2023   Procedure: INSERTION, TUNNELED CENTRAL VENOUS DEVICE, WITH PORT;  Surgeon: Dasie Leonor CROME, MD;  Location: [facility] OR;  Service: General;  Laterality: Left;  PORT-A-CATH INSERTION   TONSILLECTOMY AND ADENOIDECTOMY      Family History  Problem Relation Age of Onset   Lung cancer Mother    Hypertension Mother    Lung cancer Sister    Hypertension Sister     Social History:  reports that she has quit smoking. Her smoking use  included cigarettes. She has a 1 pack-year smoking history. She has never used smokeless tobacco. She reports that she does not drink alcohol  and does not use drugs.  Allergies:  Allergies  Allergen Reactions   Gabapentin  Shortness Of Breath   Telmisartan  Shortness Of Breath, Swelling and Other (See Comments)    Shortness of breath, foot swelling   Clopidogrel Bisulfate Other (See Comments)    REACTION: weak, tired, sleepy, tightness (6/05); soreness in hip (11/05)   Crestor  [Rosuvastatin  Calcium ] Other (See Comments)    Did not  like how she felt on it - stomach upset, nausea   Furosemide Other (See Comments)    REACTION: did not tolerate and preferred HCTZ   Lisinopril Other (See Comments)    REACTION: cough, right sided pain   Metoprolol  Tartrate Other (See Comments)    REACTION: lightheadedness, h/a cough (7/04); heart gong to stop (7/06); sluggishnes and depressed feeling (2/07); weakness/heaviness 6/07)   Mometasone Furoate Other (See Comments)    REACTION: neck tenderness   Ozempic  (0.25 Or 0.5 Mg-Dose) [Semaglutide (0.25 Or 0.5mg -Dos)] Other (See Comments)    Blurry vision    Medications: I have reviewed the patient's current medications.  Results for orders placed or performed during the hospital encounter of 01/13/24 (from the past 48 hours)  CBC with Differential     Status: Abnormal   Collection Time: 01/13/24  4:01 PM  Result Value Ref Range   WBC 10.9 (H) 4.0 - 10.5 K/uL   RBC 4.77 3.87 - 5.11 MIL/uL   Hemoglobin 11.5 (L) 12.0 - 15.0 g/dL   HCT 63.7 63.9 - 53.9 %   MCV 75.9 (L) 80.0 - 100.0 fL   MCH 24.1 (L) 26.0 - 34.0 pg   MCHC 31.8 30.0 - 36.0 g/dL   RDW 82.8 (H) 88.4 - 84.4 %   Platelets 575 (H) 150 - 400 K/uL   nRBC 1.7 (H) 0.0 - 0.2 %   Neutrophils Relative % 73 %   Neutro Abs 7.4 1.7 - 7.7 K/uL   Lymphocytes Relative 10 %   Lymphs Abs 1.0 0.7 - 4.0 K/uL   Monocytes Relative 4 %   Monocytes Absolute 0.4 0.1 - 1.0 K/uL   Eosinophils Relative 3 %   Eosinophils Absolute 0.3 0.0 - 0.5 K/uL   Basophils Relative 1 %   Basophils Absolute 0.1 0.0 - 0.1 K/uL   Immature Granulocytes 9 %   Abs Immature Granulocytes 0.89 (H) 0.00 - 0.07 K/uL    Comment: Performed at [facility], [address], [place], [place] [address]  Comprehensive metabolic panel     Status: Abnormal   Collection Time: 01/13/24  4:01 PM  Result Value Ref Range   Sodium 133 (L) 135 - 145 mmol/L    Comment: Electrolytes repeated to confirm.   Potassium 3.6 3.5 - 5.1 mmol/L    Comment: HEMOLYSIS AT  THIS LEVEL MAY AFFECT RESULT   Chloride 94 (L) 98 - 111 mmol/L   CO2 18 (L) 22 - 32 mmol/L   Glucose, Bld 262 (H) 70 - 99 mg/dL    Comment: Glucose reference range applies only to samples taken after fasting for at least 8 hours.   BUN 17 8 - 23 mg/dL   Creatinine, Ser 8.85 (H) 0.44 - 1.00 mg/dL   Calcium  9.0 8.9 - 10.3 mg/dL   Total Protein 7.2 6.5 - 8.1 g/dL   Albumin  2.8 (L) 3.5 - 5.0 g/dL   AST 769 (H) 15 - 41 U/L  Comment: HEMOLYSIS AT THIS LEVEL MAY AFFECT RESULT   ALT 114 (H) 0 - 44 U/L   Alkaline Phosphatase 693 (H) 38 - 126 U/L   Total Bilirubin 2.1 (H) 0.0 - 1.2 mg/dL   GFR, Estimated 50 (L) >60 mL/min    Comment: (NOTE) Calculated using the CKD-EPI Creatinine Equation (2021)    Anion gap 22 (H) 5 - 15    Comment: Performed at [facility], [address], [place], [place] [address]  Lipase, blood     Status: None   Collection Time: 01/13/24  4:01 PM  Result Value Ref Range   Lipase 12 11 - 51 U/L    Comment: Performed at [facility], [address], [place], [place] [address]  Troponin T, High Sensitivity     Status: Abnormal   Collection Time: 01/13/24  4:01 PM  Result Value Ref Range   Troponin T High Sensitivity 31 (H) <19 ng/L    Comment: (NOTE) Biotin concentrations > 1000 ng/mL falsely decrease TnT results.  Serial cardiac troponin measurements are suggested.  Refer to the Links section for chest pain algorithms and additional  guidance. Performed at [facility], [address] [place], [place] [address]   Blood culture (routine x 2)     Status: None (Preliminary result)   Collection Time: 01/13/24  4:02 PM   Specimen: BLOOD LEFT FOREARM  Result Value Ref Range   Specimen Description      BLOOD LEFT FOREARM Performed at [facility], [address] [place], [place] [address]    Special Requests      Blood Culture adequate volume BOTTLES DRAWN AEROBIC AND ANAEROBIC Performed at [facility], [address], [place], [place] [address]    Culture  Setup Time      GRAM NEGATIVE RODS GRAM POSITIVE COCCI IN BOTH AEROBIC AND ANAEROBIC BOTTLES CRITICAL RESULT CALLED TO, READ BACK BY AND VERIFIED WITH: PHARMD G ABBOTT 01/14/2024 0650 BY AB    Culture      GRAM NEGATIVE RODS GRAM POSITIVE COCCI CULTURE REINCUBATED FOR BETTER GROWTH Performed at [facility], [address], [place], [place] [address]    Report Status PENDING   Lactic acid, plasma     Status: Abnormal   Collection Time: 01/13/24  4:02 PM  Result Value Ref Range   Lactic Acid, Venous 6.7 (HH) 0.5 - 1.9 mmol/L    Comment: Critical Value, Read Back and verified with Cortney Mas RN,01/13/2024 @ 1646 by [facility] Performed at [facility], [address], [place], [place] [address]   Blood Culture ID Panel (Reflexed)     Status: Abnormal   Collection Time: 01/13/24  4:02 PM  Result Value Ref Range   Enterococcus faecalis DETECTED (A) NOT DETECTED    Comment: CRITICAL RESULT CALLED TO, READ BACK BY AND VERIFIED WITH: PHARMD G ABBOTT 01/14/2024 0650 BY AB    Enterococcus Faecium NOT DETECTED NOT DETECTED   Listeria monocytogenes NOT DETECTED NOT DETECTED   Staphylococcus species NOT DETECTED NOT DETECTED   Staphylococcus aureus (BCID) NOT DETECTED NOT DETECTED   Staphylococcus epidermidis NOT DETECTED NOT DETECTED   Staphylococcus lugdunensis NOT DETECTED NOT DETECTED   Streptococcus species NOT DETECTED NOT DETECTED   Streptococcus agalactiae NOT DETECTED NOT DETECTED   Streptococcus pneumoniae NOT DETECTED NOT DETECTED   Streptococcus pyogenes NOT DETECTED NOT DETECTED   A.calcoaceticus-baumannii NOT DETECTED NOT DETECTED   Bacteroides fragilis NOT DETECTED NOT DETECTED  Enterobacterales DETECTED (A) NOT DETECTED    Comment: Enterobacterales represent a large order of gram negative bacteria, not a single organism. CRITICAL RESULT CALLED TO, READ BACK BY AND  VERIFIED WITH: PHARMD G ABBOTT 01/14/2024 0650 BY AB    Enterobacter cloacae complex NOT DETECTED NOT DETECTED   Escherichia coli DETECTED (A) NOT DETECTED    Comment: CRITICAL RESULT CALLED TO, READ BACK BY AND VERIFIED WITH: PHARMD G ABBOTT 01/14/2024 0650 BY AB    Klebsiella aerogenes NOT DETECTED NOT DETECTED   Klebsiella oxytoca NOT DETECTED NOT DETECTED   Klebsiella pneumoniae NOT DETECTED NOT DETECTED   Proteus species NOT DETECTED NOT DETECTED   Salmonella species NOT DETECTED NOT DETECTED   Serratia marcescens NOT DETECTED NOT DETECTED   Haemophilus influenzae NOT DETECTED NOT DETECTED   Neisseria meningitidis NOT DETECTED NOT DETECTED   Pseudomonas aeruginosa NOT DETECTED NOT DETECTED   Stenotrophomonas maltophilia NOT DETECTED NOT DETECTED   Candida albicans NOT DETECTED NOT DETECTED   Candida auris NOT DETECTED NOT DETECTED   Candida glabrata NOT DETECTED NOT DETECTED   Candida krusei NOT DETECTED NOT DETECTED   Candida parapsilosis NOT DETECTED NOT DETECTED   Candida tropicalis NOT DETECTED NOT DETECTED   Cryptococcus neoformans/gattii NOT DETECTED NOT DETECTED   CTX-M ESBL NOT DETECTED NOT DETECTED   Carbapenem resistance IMP NOT DETECTED NOT DETECTED   Carbapenem resistance KPC NOT DETECTED NOT DETECTED   Carbapenem resistance NDM NOT DETECTED NOT DETECTED   Carbapenem resist OXA 48 LIKE NOT DETECTED NOT DETECTED   Vancomycin  resistance NOT DETECTED NOT DETECTED   Carbapenem resistance VIM NOT DETECTED NOT DETECTED    Comment: Performed at [facility], [address] [place], [place] [address]  Resp panel by RT-PCR (RSV, Flu A&B, Covid) Anterior Nasal Swab     Status: None   Collection Time: 01/13/24  5:28 PM   Specimen: Anterior Nasal Swab  Result Value Ref Range   SARS Coronavirus 2 by RT PCR NEGATIVE NEGATIVE    Comment: (NOTE) SARS-CoV-2 target nucleic acids are NOT DETECTED.  The SARS-CoV-2 RNA is generally detectable in upper respiratory specimens  during the acute phase of infection. The lowest concentration of SARS-CoV-2 viral copies this assay can detect is 138 copies/mL. A negative result does not preclude SARS-Cov-2 infection and should not be used as the sole basis for treatment or other patient management decisions. A negative result may occur with  improper specimen collection/handling, submission of specimen other than nasopharyngeal swab, presence of viral mutation(s) within the areas targeted by this assay, and inadequate number of viral copies(<138 copies/mL). A negative result must be combined with clinical observations, patient history, and epidemiological information. The expected result is Negative.  Fact Sheet for Patients:  BloggerCourse.com  Fact Sheet for Healthcare Providers:  SeriousBroker.it  This test is no t yet approved or cleared by the [place]  FDA and  has been authorized for detection and/or diagnosis of SARS-CoV-2 by FDA under an Emergency Use Authorization (EUA). This EUA will remain  in effect (meaning this test can be used) for the duration of the COVID-19 declaration under Section 564(b)(1) of the Act, 21 U.S.C.section 360bbb-3(b)(1), unless the authorization is terminated  or revoked sooner.       Influenza A by PCR NEGATIVE NEGATIVE   Influenza B by PCR NEGATIVE NEGATIVE    Comment: (NOTE) The Xpert Xpress SARS-CoV-2/FLU/RSV plus assay is intended as an aid in the diagnosis of influenza from Nasopharyngeal swab specimens and should not  be used as a sole basis for treatment. Nasal washings and aspirates are unacceptable for Xpert Xpress SARS-CoV-2/FLU/RSV testing.  Fact Sheet for Patients: BloggerCourse.com  Fact Sheet for Healthcare Providers: SeriousBroker.it  This test is not yet approved or cleared by the [place]  FDA and has been authorized for detection and/or diagnosis  of SARS-CoV-2 by FDA under an Emergency Use Authorization (EUA). This EUA will remain in effect (meaning this test can be used) for the duration of the COVID-19 declaration under Section 564(b)(1) of the Act, 21 U.S.C. section 360bbb-3(b)(1), unless the authorization is terminated or revoked.     Resp Syncytial Virus by PCR NEGATIVE NEGATIVE    Comment: (NOTE) Fact Sheet for Patients: BloggerCourse.com  Fact Sheet for Healthcare Providers: SeriousBroker.it  This test is not yet approved or cleared by the [place]  FDA and has been authorized for detection and/or diagnosis of SARS-CoV-2 by FDA under an Emergency Use Authorization (EUA). This EUA will remain in effect (meaning this test can be used) for the duration of the COVID-19 declaration under Section 564(b)(1) of the Act, 21 U.S.C. section 360bbb-3(b)(1), unless the authorization is terminated or revoked.  Performed at [facility], [address] [place], [place] [address]   Troponin T, High Sensitivity     Status: Abnormal   Collection Time: 01/13/24  6:01 PM  Result Value Ref Range   Troponin T High Sensitivity 40 (H) <19 ng/L    Comment: (NOTE) Biotin concentrations > 1000 ng/mL falsely decrease TnT results.  Serial cardiac troponin measurements are suggested.  Refer to the Links section for chest pain algorithms and additional  guidance. Performed at [facility], [address] [place], [place] [address]   Lactic acid, plasma     Status: Abnormal   Collection Time: 01/13/24  6:02 PM  Result Value Ref Range   Lactic Acid, Venous 4.9 (HH) 0.5 - 1.9 mmol/L    Comment: Critical value noted. Value is consistent with previously reported and called value  Performed at [facility], [address] [place], [place] [address]   MRSA Next Gen by PCR, Nasal     Status: None   Collection Time: 01/13/24   9:32 PM   Specimen: Nasal Mucosa; Nasal Swab  Result Value Ref Range   MRSA by PCR Next Gen NOT DETECTED NOT DETECTED    Comment: (NOTE) The GeneXpert MRSA Assay (FDA approved for NASAL specimens only), is one component of a comprehensive MRSA colonization surveillance program. It is not intended to diagnose MRSA infection nor to guide or monitor treatment for MRSA infections. Test performance is not FDA approved in patients less than 26 years old. Performed at [facility], [address] [place], [place] [address]   Glucose, capillary     Status: Abnormal   Collection Time: 01/13/24  9:41 PM  Result Value Ref Range   Glucose-Capillary 183 (H) 70 - 99 mg/dL    Comment: Glucose reference range applies only to samples taken after fasting for at least 8 hours.  Troponin I (High Sensitivity)     Status: Abnormal   Collection Time: 01/13/24 10:05 PM  Result Value Ref Range   Troponin I (High Sensitivity) 150 (HH) <18 ng/L    Comment: CRITICAL RESULT CALLED TO, READ BACK BY AND VERIFIED WITH MEJIA E, RN [phone] 01/14/2024 SANDOVAL K (NOTE) Elevated high sensitivity troponin I (hsTnI) values and significant  changes across serial measurements may suggest ACS but many other  chronic and acute  conditions are known to elevate hsTnI results.  Refer to the Links section for chest pain algorithms and additional  guidance. Performed at [facility], [address] [place], [place] [address]   Glucose, capillary     Status: Abnormal   Collection Time: 01/13/24 11:08 PM  Result Value Ref Range   Glucose-Capillary 189 (H) 70 - 99 mg/dL    Comment: Glucose reference range applies only to samples taken after fasting for at least 8 hours.  Urinalysis, Routine w reflex microscopic -Urine, Clean Catch     Status: Abnormal   Collection Time: 01/13/24 11:37 PM  Result Value Ref Range   Color, Urine AMBER (A) YELLOW    Comment: BIOCHEMICALS MAY BE AFFECTED BY COLOR   APPearance HAZY  (A) CLEAR   Specific Gravity, Urine >1.046 (H) 1.005 - 1.030   pH 5.0 5.0 - 8.0   Glucose, UA 50 (A) NEGATIVE mg/dL   Hgb urine dipstick NEGATIVE NEGATIVE   Bilirubin Urine NEGATIVE NEGATIVE   Ketones, ur NEGATIVE NEGATIVE mg/dL   Protein, ur 30 (A) NEGATIVE mg/dL   Nitrite NEGATIVE NEGATIVE   Leukocytes,Ua NEGATIVE NEGATIVE   RBC / HPF 0-5 0 - 5 RBC/hpf   WBC, UA 0-5 0 - 5 WBC/hpf   Bacteria, UA RARE (A) NONE SEEN   Squamous Epithelial / HPF 0-5 0 - 5 /HPF   Mucus PRESENT    Hyaline Casts, UA PRESENT     Comment: Performed at [facility], [address] [place], [place] [address]  Glucose, capillary     Status: Abnormal   Collection Time: 01/14/24  3:35 AM  Result Value Ref Range   Glucose-Capillary 142 (H) 70 - 99 mg/dL    Comment: Glucose reference range applies only to samples taken after fasting for at least 8 hours.  Comprehensive metabolic panel with GFR     Status: Abnormal   Collection Time: 01/14/24  5:00 AM  Result Value Ref Range   Sodium 131 (L) 135 - 145 mmol/L   Potassium 2.7 (LL) 3.5 - 5.1 mmol/L    Comment: CRITICAL RESULT CALLED TO, READ BACK BY AND VERIFIED WITH BILLY PARAS, RN [phone] 01/14/2024 SANDOVAL K   Chloride 99 98 - 111 mmol/L   CO2 14 (L) 22 - 32 mmol/L   Glucose, Bld 327 (H) 70 - 99 mg/dL    Comment: Glucose reference range applies only to samples taken after fasting for at least 8 hours.   BUN 13 8 - 23 mg/dL   Creatinine, Ser 8.71 (H) 0.44 - 1.00 mg/dL   Calcium  6.5 (L) 8.9 - 10.3 mg/dL   Total Protein 5.4 (L) 6.5 - 8.1 g/dL   Albumin  <1.5 (L) 3.5 - 5.0 g/dL   AST 645 (H) 15 - 41 U/L    Comment: RESULT CONFIRMED BY MANUAL DILUTION   ALT 141 (H) 0 - 44 U/L   Alkaline Phosphatase 331 (H) 38 - 126 U/L   Total Bilirubin 2.8 (H) 0.0 - 1.2 mg/dL   GFR, Estimated 44 (L) >60 mL/min    Comment: (NOTE) Calculated using the CKD-EPI Creatinine Equation (2021)    Anion gap 18 (H) 5 - 15    Comment: Performed at [facility], [address], [place], [place] [address]  Magnesium      Status: Abnormal   Collection Time: 01/14/24  5:00 AM  Result Value Ref Range   Magnesium  1.2 (L) 1.7 - 2.4 mg/dL    Comment:  Performed at [facility], [address] [place], [place] [address]  Phosphorus     Status: None   Collection Time: 01/14/24  5:00 AM  Result Value Ref Range   Phosphorus 3.8 2.5 - 4.6 mg/dL    Comment: Performed at [facility], [address], [place], [place] [address]  CBC     Status: Abnormal   Collection Time: 01/14/24  5:00 AM  Result Value Ref Range   WBC 27.7 (H) 4.0 - 10.5 K/uL   RBC 3.14 (L) 3.87 - 5.11 MIL/uL   Hemoglobin 7.7 (L) 12.0 - 15.0 g/dL    Comment: Reticulocyte Hemoglobin testing may be clinically indicated, consider ordering this additional test [id number]    HCT 23.9 (L) 36.0 - 46.0 %   MCV 76.1 (L) 80.0 - 100.0 fL   MCH 24.5 (L) 26.0 - 34.0 pg   MCHC 32.2 30.0 - 36.0 g/dL   RDW 82.7 (H) 88.4 - 84.4 %   Platelets 296 150 - 400 K/uL   nRBC 0.3 (H) 0.0 - 0.2 %    Comment: Performed at [facility], [address], [place], [place] [address]  Protime-INR     Status: Abnormal   Collection Time: 01/14/24  5:00 AM  Result Value Ref Range   Prothrombin Time 23.7 (H) 11.4 - 15.2 seconds   INR 2.0 (H) 0.8 - 1.2    Comment: (NOTE) INR goal varies based on device and disease states. Performed at [facility], [address], [place], [place] [address]   Troponin I (High Sensitivity)     Status: Abnormal   Collection Time: 01/14/24  5:00 AM  Result Value Ref Range   Troponin I (High Sensitivity) 210 (HH) <18 ng/L    Comment: CRITICAL VALUE NOTED. VALUE IS CONSISTENT WITH PREVIOUSLY REPORTED/CALLED VALUE (NOTE) Elevated high sensitivity troponin I (hsTnI) values and significant  changes across serial measurements may suggest ACS but many other  chronic and acute conditions are known to elevate hsTnI results.  Refer to the Links section for chest pain algorithms  and additional  guidance. Performed at [facility], [address], [place], [place] [address]   ABO/Rh     Status: None   Collection Time: 01/14/24  5:01 AM  Result Value Ref Range   ABO/RH(D)      B POS Performed at [facility], [address] [place], [place] [address]   Glucose, capillary     Status: Abnormal   Collection Time: 01/14/24  7:50 AM  Result Value Ref Range   Glucose-Capillary 140 (H) 70 - 99 mg/dL    Comment: Glucose reference range applies only to samples taken after fasting for at least 8 hours.  Prepare fresh frozen plasma     Status: None (Preliminary result)   Collection Time: 01/14/24  9:32 AM  Result Value Ref Range   Unit Number [id number]    Blood Component Type THW PLS APHR    Unit division A0    Status of Unit ISSUED    Transfusion Status OK TO TRANSFUSE   Type and screen [facility]     Status: None (Preliminary result)   Collection Time: 01/14/24 10:35 AM  Result Value Ref Range   ABO/RH(D) B POS    Antibody Screen POS    Sample Expiration 01/17/2024,2359    DAT, IgG NEG    Antibody Identification ANTI LEA Angelica a)    PT  AG Type NEGATIVE FOR LEWIS A ANTIGEN    Unit Number [id number]    Blood Component Type RED CELLS,LR    Unit division 00    Status of Unit ALLOCATED    Donor AG Type NEGATIVE FOR LEWIS A ANTIGEN    Transfusion Status OK TO TRANSFUSE    Crossmatch Result COMPATIBLE    Unit Number [id number]    Blood Component Type RED CELLS,LR    Unit division 00    Status of Unit ALLOCATED    Donor AG Type NEGATIVE FOR LEWIS A ANTIGEN    Transfusion Status OK TO TRANSFUSE    Crossmatch Result COMPATIBLE   Glucose, capillary     Status: Abnormal   Collection Time: 01/14/24 11:22 AM  Result Value Ref Range   Glucose-Capillary 121 (H) 70 - 99 mg/dL    Comment: Glucose reference range applies only to samples taken after fasting for at least 8 hours.  I-STAT, chem 8     Status: Abnormal    Collection Time: 01/14/24  2:48 PM  Result Value Ref Range   Sodium 136 135 - 145 mmol/L   Potassium 3.4 (L) 3.5 - 5.1 mmol/L   Chloride 101 98 - 111 mmol/L   BUN 13 8 - 23 mg/dL   Creatinine, Ser 8.99 0.44 - 1.00 mg/dL   Glucose, Bld 891 (H) 70 - 99 mg/dL    Comment: Glucose reference range applies only to samples taken after fasting for at least 8 hours.   Calcium , Ion 1.00 (L) 1.15 - 1.40 mmol/L   TCO2 19 (L) 22 - 32 mmol/L   Hemoglobin 8.8 (L) 12.0 - 15.0 g/dL   HCT 73.9 (L) 63.9 - 53.9 %  I-STAT 7, (LYTES, BLD GAS, ICA, H+H)     Status: Abnormal   Collection Time: 01/14/24  6:00 PM  Result Value Ref Range   pH, Arterial 7.305 (L) 7.35 - 7.45   pCO2 arterial 44.3 32 - 48 mmHg   pO2, Arterial 377 (H) 83 - 108 mmHg   Bicarbonate 22.0 20.0 - 28.0 mmol/L   TCO2 23 22 - 32 mmol/L   O2 Saturation 100 %   Acid-base deficit 4.0 (H) 0.0 - 2.0 mmol/L   Sodium 135 135 - 145 mmol/L   Potassium 4.5 3.5 - 5.1 mmol/L   Calcium , Ion 0.96 (L) 1.15 - 1.40 mmol/L   HCT 24.0 (L) 36.0 - 46.0 %   Hemoglobin 8.2 (L) 12.0 - 15.0 g/dL   Collection site art line    Drawn by RT    Sample type ARTERIAL   Basic metabolic panel     Status: Abnormal   Collection Time: 01/14/24  6:34 PM  Result Value Ref Range   Sodium 133 (L) 135 - 145 mmol/L   Potassium 4.2 3.5 - 5.1 mmol/L    Comment: HEMOLYSIS AT THIS LEVEL MAY AFFECT RESULT   Chloride 101 98 - 111 mmol/L   CO2 18 (L) 22 - 32 mmol/L   Glucose, Bld 138 (H) 70 - 99 mg/dL    Comment: Glucose reference range applies only to samples taken after fasting for at least 8 hours.   BUN 13 8 - 23 mg/dL   Creatinine, Ser 9.04 0.44 - 1.00 mg/dL   Calcium  7.0 (L) 8.9 - 10.3 mg/dL   GFR, Estimated >39 >39 mL/min    Comment: (NOTE) Calculated using the CKD-EPI Creatinine Equation (2021)    Anion gap 14 5 - 15    Comment: Performed at  [facility], [address] [place], [place] [address]  Glucose, capillary     Status: Abnormal   Collection Time:  01/14/24  7:23 PM  Result Value Ref Range   Glucose-Capillary 121 (H) 70 - 99 mg/dL    Comment: Glucose reference range applies only to samples taken after fasting for at least 8 hours.  Glucose, capillary     Status: Abnormal   Collection Time: 01/14/24 11:22 PM  Result Value Ref Range   Glucose-Capillary 158 (H) 70 - 99 mg/dL    Comment: Glucose reference range applies only to samples taken after fasting for at least 8 hours.  Glucose, capillary     Status: Abnormal   Collection Time: 01/15/24  3:21 AM  Result Value Ref Range   Glucose-Capillary 138 (H) 70 - 99 mg/dL    Comment: Glucose reference range applies only to samples taken after fasting for at least 8 hours.  Triglycerides     Status: Abnormal   Collection Time: 01/15/24  5:08 AM  Result Value Ref Range   Triglycerides 239 (H) <150 mg/dL    Comment: Performed at [facility], [address], [place], [place] [address]  Magnesium      Status: Abnormal   Collection Time: 01/15/24  5:08 AM  Result Value Ref Range   Magnesium  2.8 (H) 1.7 - 2.4 mg/dL    Comment: Performed at [facility], [address] [place], [place] [address]  Basic metabolic panel     Status: Abnormal   Collection Time: 01/15/24  5:08 AM  Result Value Ref Range   Sodium 132 (L) 135 - 145 mmol/L   Potassium 3.8 3.5 - 5.1 mmol/L   Chloride 102 98 - 111 mmol/L   CO2 17 (L) 22 - 32 mmol/L   Glucose, Bld 161 (H) 70 - 99 mg/dL    Comment: Glucose reference range applies only to samples taken after fasting for at least 8 hours.   BUN 14 8 - 23 mg/dL   Creatinine, Ser 8.84 (H) 0.44 - 1.00 mg/dL   Calcium  6.9 (L) 8.9 - 10.3 mg/dL   GFR, Estimated 50 (L) >60 mL/min    Comment: (NOTE) Calculated using the CKD-EPI Creatinine Equation (2021)    Anion gap 13 5 - 15    Comment: Performed at [facility], [address] [place], [place] [address]  CBC with Differential/Platelet     Status: Abnormal   Collection Time: 01/15/24  5:08 AM  Result  Value Ref Range   WBC 19.6 (H) 4.0 - 10.5 K/uL   RBC 3.26 (L) 3.87 - 5.11 MIL/uL   Hemoglobin 8.1 (L) 12.0 - 15.0 g/dL    Comment: Reticulocyte Hemoglobin testing may be clinically indicated, consider ordering this additional test [id number]    HCT 24.5 (L) 36.0 - 46.0 %   MCV 75.2 (L) 80.0 - 100.0 fL   MCH 24.8 (L) 26.0 - 34.0 pg   MCHC 33.1 30.0 - 36.0 g/dL   RDW 82.7 (H) 88.4 - 84.4 %   Platelets 76 (L) 150 - 400 K/uL    Comment: SPECIMEN CHECKED FOR CLOTS DELTA CHECK NOTED PLATELETS APPEAR DECREASED PLATELET COUNT CONFIRMED BY SMEAR    nRBC 0.0 0.0 - 0.2 %   Neutrophils Relative % 93 %   Neutro Abs 18.2 (H) 1.7 - 7.7 K/uL   Lymphocytes Relative 4 %   Lymphs Abs 0.8 0.7 - 4.0 K/uL   Monocytes Relative 3 %  Monocytes Absolute 0.6 0.1 - 1.0 K/uL   Eosinophils Relative 0 %   Eosinophils Absolute 0.0 0.0 - 0.5 K/uL   Basophils Relative 0 %   Basophils Absolute 0.0 0.0 - 0.1 K/uL   Polychromasia PRESENT     Comment: Performed at [facility], [address], [place], [place] [address]  Lactic acid, plasma     Status: Abnormal   Collection Time: 01/15/24  5:08 AM  Result Value Ref Range   Lactic Acid, Venous 2.2 (HH) 0.5 - 1.9 mmol/L    Comment: CRITICAL RESULT CALLED TO, READ BACK BY AND VERIFIED WITH CANDIE POUCH RN [phone] M.[facility] Performed at [facility], [address], [place], [place] [address]   Glucose, capillary     Status: Abnormal   Collection Time: 01/15/24  7:50 AM  Result Value Ref Range   Glucose-Capillary 191 (H) 70 - 99 mg/dL    Comment: Glucose reference range applies only to samples taken after fasting for at least 8 hours.  Hepatic function panel     Status: Abnormal   Collection Time: 01/15/24  9:00 AM  Result Value Ref Range   Total Protein 5.3 (L) 6.5 - 8.1 g/dL   Albumin  1.7 (L) 3.5 - 5.0 g/dL   AST 808 (H) 15 - 41 U/L   ALT 136 (H) 0 - 44 U/L   Alkaline Phosphatase 328 (H) 38 - 126 U/L   Total Bilirubin 2.6 (H) 0.0 - 1.2  mg/dL   Bilirubin, Direct 1.4 (H) 0.0 - 0.2 mg/dL   Indirect Bilirubin 1.2 (H) 0.3 - 0.9 mg/dL    Comment: Performed at [facility], [address] [place], [place] [address]    ECHOCARDIOGRAM COMPLETE Result Date: 01/14/2024    ECHOCARDIOGRAM REPORT   Patient Name:   AGNES BRIGHTBILL Date of Exam: 01/14/2024 Medical Rec #:  [id number]     Height:       65.0 in Accession #:    [id number]    Weight:       236.3 lb Date of Birth:  06-Jan-1951    BSA:          2.124 m Patient Age:    72 years      BP:           102/68 mmHg Patient Gender: F             HR:           112 bpm. Exam Location:  Inpatient Procedure: 2D Echo (Both Spectral and Color Flow Doppler were utilized during            procedure). Indications:    shock  History:        Patient has prior history of Echocardiogram examinations, most                 recent 10/16/2004. CAD, PAD, Signs/Symptoms:Bacteremia; Risk                 Factors:Dyslipidemia, Diabetes and Hypertension.  Sonographer:    Tinnie Barefoot RDCS Referring Phys: [id number] ORN [facility]  Sonographer Comments: Patient is obese and suboptimal subcostal window. Image acquisition challenging due to patient body habitus. IMPRESSIONS  1. Left ventricular ejection fraction, by estimation, is 50 to 55%. The left ventricle has low normal function. The left ventricle has no regional wall motion abnormalities. There is mild left ventricular hypertrophy. Left ventricular diastolic parameters are indeterminate.  2. Right ventricular systolic function  is normal. The right ventricular size is normal.  3. The mitral valve is normal in structure. Trivial mitral valve regurgitation.  4. The aortic valve is tricuspid. Aortic valve regurgitation is not visualized. Aortic valve sclerosis is present, with no evidence of aortic valve stenosis. FINDINGS  Left Ventricle: Left ventricular ejection fraction, by estimation, is 50 to 55%. The left ventricle has low normal function. The left ventricle has no  regional wall motion abnormalities. Definity  contrast agent was given IV to delineate the left ventricular endocardial borders. The left ventricular internal cavity size was normal in size. There is mild left ventricular hypertrophy. Left ventricular diastolic parameters are indeterminate. Right Ventricle: The right ventricular size is normal. Right vetricular wall thickness was not assessed. Right ventricular systolic function is normal. Left Atrium: Left atrial size was normal in size. Right Atrium: Right atrial size was normal in size. Pericardium: Trivial pericardial effusion is present. Mitral Valve: The mitral valve is normal in structure. Trivial mitral valve regurgitation. Tricuspid Valve: The tricuspid valve is grossly normal. Tricuspid valve regurgitation is trivial. Aortic Valve: The aortic valve is tricuspid. Aortic valve regurgitation is not visualized. Aortic valve sclerosis is present, with no evidence of aortic valve stenosis. Pulmonic Valve: The pulmonic valve was normal in structure. Pulmonic valve regurgitation is not visualized. Aorta: The aortic root and ascending aorta are structurally normal, with no evidence of dilitation. IAS/Shunts: No atrial level shunt detected by color flow Doppler.  LEFT VENTRICLE PLAX 2D LVIDd:         4.00 cm   Diastology LVIDs:         2.80 cm   LV e' medial:  6.64 cm/s LV PW:         1.10 cm   LV e' lateral: 9.57 cm/s LV IVS:        1.20 cm LVOT diam:     1.90 cm LV SV:         42 LV SV Index:   20 LVOT Area:     2.84 cm  RIGHT VENTRICLE RV Basal diam:  2.90 cm RV S prime:     14.80 cm/s TAPSE (M-mode): 1.7 cm LEFT ATRIUM             Index        RIGHT ATRIUM           Index LA diam:        3.10 cm 1.46 cm/m   RA Area:     12.00 cm LA Vol (A2C):   51.8 ml 24.39 ml/m  RA Volume:   26.90 ml  12.67 ml/m LA Vol (A4C):   35.7 ml 16.81 ml/m LA Biplane Vol: 46.7 ml 21.99 ml/m  AORTIC VALVE LVOT Vmax:   95.60 cm/s LVOT Vmean:  63.300 cm/s LVOT VTI:    0.147 m  AORTA  Ao Root diam: 3.00 cm Ao Asc diam:  3.40 cm MV A velocity: 117.00 cm/s                             SHUNTS                             Systemic VTI:  0.15 m                             Systemic Diam: 1.90 cm Vina Gull  MD Electronically signed by Vina Gull MD Signature Date/Time: 01/14/2024/12:34:00 PM    Final    CT Angio Chest PE W and/or Wo Contrast Result Date: 01/13/2024 CLINICAL DATA:  Chest and abdominal pain. Pancreatic adenocarcinoma. Concern for pulmonary embolism. EXAM: CT ANGIOGRAPHY CHEST CT ABDOMEN AND PELVIS WITH CONTRAST TECHNIQUE: Multidetector CT imaging of the chest was performed using the standard protocol during bolus administration of intravenous contrast. Multiplanar CT image reconstructions and MIPs were obtained to evaluate the vascular anatomy. Multidetector CT imaging of the abdomen and pelvis was performed using the standard protocol during bolus administration of intravenous contrast. RADIATION DOSE REDUCTION: This exam was performed according to the departmental dose-optimization program which includes automated exposure control, adjustment of the mA and/or kV according to patient size and/or use of iterative reconstruction technique. CONTRAST:  OMNIPAQUE  IOHEXOL  350 MG/ML SOLN COMPARISON:  Chest CT dated 12/09/2023 and CT abdomen pelvis dated 12/07/2023. FINDINGS: Evaluation of this exam is limited due to respiratory motion. CTA CHEST FINDINGS Cardiovascular: There is no cardiomegaly or pericardial effusion. There is 3 vessel coronary vascular calcification. Mild atherosclerotic calcification of the thoracic aorta. No aneurysmal dilatation or dissection. Evaluation of the pulmonary arteries is limited due to respiratory motion and suboptimal visualization of the peripheral branches. No central pulmonary artery embolus identified. Mediastinum/Nodes: No hilar or mediastinal adenopathy. The esophagus and the thyroid  gland are grossly unremarkable. No mediastinal fluid collection.  Right-sided Port-A-Cath tip close to the cavoatrial junction. Lungs/Pleura: Right suprahilar/apical scarring as seen on the prior CT. There is background of emphysema. Small scattered calcified granuloma. No focal consolidation, pleural effusion, or pneumothorax. The central airways are patent. Musculoskeletal: Degenerative changes of the spine. No acute osseous pathology. Review of the MIP images confirms the above findings. CT ABDOMEN and PELVIS FINDINGS No intra-abdominal free air or free fluid. Hepatobiliary: Fatty liver. Several rounded hypoenhancing areas within the liver measuring up to 3.3 x 2.8 cm may represent post treatment changes and areas of infarct. There is looping abscesses are less likely but not excluded. There is mild dilatation, post cholecystectomy. Interval placement of a Wallstent in the common bile duct. There is mild pneumobilia. Pancreas: Pancreatic atrophy, progressed since the prior CT. Ill-defined hypoenhancing area in the uncinate process of the pancreas is poorly visualized and suboptimally evaluated due to respiratory motion. Spleen: Normal in size without focal abnormality. Adrenals/Urinary Tract: The adrenal glands are unremarkable. There is no hydronephrosis on either side there is symmetric enhancement and excretion of contrast by both kidneys. The visualized ureters and urinary bladder appear unremarkable. Stomach/Bowel: Mild diffuse thickened appearance of the colon, likely related to underdistention. Colitis is less likely but not excluded clinical correlation is recommended. There is no bowel obstruction. The appendix is not visualized with certainty. No inflammatory changes identified in the right lower quadrant. Vascular/Lymphatic: Mild aortoiliac atherosclerotic disease. The IVC is unremarkable. No portal venous gas. No adenopathy. Reproductive: Calcified uterine fibroids. No suspicious adnexal masses. Other: None Musculoskeletal: Degenerative changes of the spine. No  acute osseous pathology. Review of the MIP images confirms the above findings. IMPRESSION: 1. No acute intrathoracic pathology. No central pulmonary artery embolus identified. 2. Interval placement of a Wallstent in the common bile duct. 3. Fatty liver. Several rounded hypoenhancing areas within the liver may represent post treatment changes and areas of infarct. Abscesses are less likely but not excluded. 4. Mild diffuse thickened appearance of the colon, likely related to underdistention. Colitis is less likely but not excluded clinical correlation is recommended. No bowel  obstruction. 5.  Aortic Atherosclerosis (ICD10-I70.0). Electronically Signed   By: Vanetta Chou M.D.   On: 01/13/2024 16:52   CT ABDOMEN PELVIS W CONTRAST Result Date: 01/13/2024 CLINICAL DATA:  Chest and abdominal pain. Pancreatic adenocarcinoma. Concern for pulmonary embolism. EXAM: CT ANGIOGRAPHY CHEST CT ABDOMEN AND PELVIS WITH CONTRAST TECHNIQUE: Multidetector CT imaging of the chest was performed using the standard protocol during bolus administration of intravenous contrast. Multiplanar CT image reconstructions and MIPs were obtained to evaluate the vascular anatomy. Multidetector CT imaging of the abdomen and pelvis was performed using the standard protocol during bolus administration of intravenous contrast. RADIATION DOSE REDUCTION: This exam was performed according to the departmental dose-optimization program which includes automated exposure control, adjustment of the mA and/or kV according to patient size and/or use of iterative reconstruction technique. CONTRAST:  OMNIPAQUE  IOHEXOL  350 MG/ML SOLN COMPARISON:  Chest CT dated 12/09/2023 and CT abdomen pelvis dated 12/07/2023. FINDINGS: Evaluation of this exam is limited due to respiratory motion. CTA CHEST FINDINGS Cardiovascular: There is no cardiomegaly or pericardial effusion. There is 3 vessel coronary vascular calcification. Mild atherosclerotic calcification of the  thoracic aorta. No aneurysmal dilatation or dissection. Evaluation of the pulmonary arteries is limited due to respiratory motion and suboptimal visualization of the peripheral branches. No central pulmonary artery embolus identified. Mediastinum/Nodes: No hilar or mediastinal adenopathy. The esophagus and the thyroid  gland are grossly unremarkable. No mediastinal fluid collection. Right-sided Port-A-Cath tip close to the cavoatrial junction. Lungs/Pleura: Right suprahilar/apical scarring as seen on the prior CT. There is background of emphysema. Small scattered calcified granuloma. No focal consolidation, pleural effusion, or pneumothorax. The central airways are patent. Musculoskeletal: Degenerative changes of the spine. No acute osseous pathology. Review of the MIP images confirms the above findings. CT ABDOMEN and PELVIS FINDINGS No intra-abdominal free air or free fluid. Hepatobiliary: Fatty liver. Several rounded hypoenhancing areas within the liver measuring up to 3.3 x 2.8 cm may represent post treatment changes and areas of infarct. There is looping abscesses are less likely but not excluded. There is mild dilatation, post cholecystectomy. Interval placement of a Wallstent in the common bile duct. There is mild pneumobilia. Pancreas: Pancreatic atrophy, progressed since the prior CT. Ill-defined hypoenhancing area in the uncinate process of the pancreas is poorly visualized and suboptimally evaluated due to respiratory motion. Spleen: Normal in size without focal abnormality. Adrenals/Urinary Tract: The adrenal glands are unremarkable. There is no hydronephrosis on either side there is symmetric enhancement and excretion of contrast by both kidneys. The visualized ureters and urinary bladder appear unremarkable. Stomach/Bowel: Mild diffuse thickened appearance of the colon, likely related to underdistention. Colitis is less likely but not excluded clinical correlation is recommended. There is no bowel  obstruction. The appendix is not visualized with certainty. No inflammatory changes identified in the right lower quadrant. Vascular/Lymphatic: Mild aortoiliac atherosclerotic disease. The IVC is unremarkable. No portal venous gas. No adenopathy. Reproductive: Calcified uterine fibroids. No suspicious adnexal masses. Other: None Musculoskeletal: Degenerative changes of the spine. No acute osseous pathology. Review of the MIP images confirms the above findings. IMPRESSION: 1. No acute intrathoracic pathology. No central pulmonary artery embolus identified. 2. Interval placement of a Wallstent in the common bile duct. 3. Fatty liver. Several rounded hypoenhancing areas within the liver may represent post treatment changes and areas of infarct. Abscesses are less likely but not excluded. 4. Mild diffuse thickened appearance of the colon, likely related to underdistention. Colitis is less likely but not excluded clinical correlation is recommended. No  bowel obstruction. 5.  Aortic Atherosclerosis (ICD10-I70.0). Electronically Signed   By: Vanetta Chou M.D.   On: 01/13/2024 16:52   CT Head Wo Contrast Result Date: 01/13/2024 CLINICAL DATA:  Altered level of consciousness, pancreatic cancer EXAM: CT HEAD WITHOUT CONTRAST TECHNIQUE: Contiguous axial images were obtained from the base of the skull through the vertex without intravenous contrast. RADIATION DOSE REDUCTION: This exam was performed according to the departmental dose-optimization program which includes automated exposure control, adjustment of the mA and/or kV according to patient size and/or use of iterative reconstruction technique. COMPARISON:  None Available. FINDINGS: Brain: There are indeterminate hypodensities within the right frontal subcortical white matter, right parietal and occipital periventricular white matter, and left occipital cortex, most consistent with age indeterminate ischemic changes. Chronic appearing ischemic changes are seen  within the cerebellar hemispheres and left occipital cortex. No evidence of acute hemorrhage. Lateral ventricles and remaining midline structures are unremarkable. No acute extra-axial fluid collections. No mass effect. Vascular: No hyperdense vessel or unexpected calcification. Diffuse atherosclerosis. Skull: Normal. Negative for fracture or focal lesion. Sinuses/Orbits: No acute finding. Other: None. IMPRESSION: 1. Indeterminate hypodensities within the right frontal, parietal, and occipital white matter as above, favor age indeterminate small vessel ischemic changes. 2. Chronic appearing ischemic changes within the cerebellar hemispheres and left occipital cortex. 3. No evidence of acute hemorrhage. Electronically Signed   By: Ozell Daring M.D.   On: 01/13/2024 16:49    Review of Systems  Constitutional:  Positive for fatigue.  HENT: Negative.    Eyes: Negative.   Respiratory: Negative.    Cardiovascular: Negative.   Gastrointestinal: Negative.   Endocrine: Negative.   Genitourinary: Negative.   Musculoskeletal: Negative.   Skin: Negative.   Allergic/Immunologic: Negative.   Neurological: Negative.   Hematological: Negative.   Psychiatric/Behavioral: Negative.     Blood pressure 128/69, pulse (!) 104, temperature (!) 97.1 F (36.2 C), temperature source Axillary, resp. rate (!) 42, height 5' 5 (1.651 m), weight 110.6 kg, SpO2 99%. Physical Exam Vitals reviewed.  Constitutional:      General: She is not in acute distress.    Appearance: She is obese.  HENT:     Head: Normocephalic and atraumatic.     Right Ear: External ear normal.     Left Ear: External ear normal.     Nose: Nose normal.     Mouth/Throat:     Mouth: Mucous membranes are dry.     Pharynx: Oropharynx is clear.  Eyes:     Extraocular Movements: Extraocular movements intact.     Conjunctiva/sclera: Conjunctivae normal.     Pupils: Pupils are equal, round, and reactive to light.  Cardiovascular:     Rate and  Rhythm: Normal rate and regular rhythm.     Pulses: Normal pulses.     Heart sounds: Normal heart sounds.  Pulmonary:     Effort: Pulmonary effort is normal.     Breath sounds: Normal breath sounds.  Abdominal:     General: Abdomen is flat.     Palpations: Abdomen is soft.     Tenderness: There is no abdominal tenderness.  Musculoskeletal:        General: Swelling present. No deformity. Normal range of motion.     Cervical back: Normal range of motion and neck supple.  Skin:    General: Skin is warm and dry.  Neurological:     General: No focal deficit present.     Mental Status: She is alert and oriented to  person, place, and time.  Psychiatric:        Mood and Affect: Mood normal.        Behavior: Behavior normal.     Assessment/Plan: The patient presented with cholangitis from an occluded stent that was treating her pancreatic head cancer.  This is likely the source of her infection.  Her port site looks good but it is difficult to know if the port could be seeded.  At this point her acute infection should be drained.  She should continue broad-spectrum antibiotic therapy.  At some point it may be helpful to her to have the port removed if we truly believe the port has been seeded by bacteria but otherwise the port can continue to be used.  We will discuss this with Dr. Dasie and the primary team early next week.  We will follow her closely with you.  Deward Null III 01/15/2024, 10:52 AM

## 2024-01-15 NOTE — Progress Notes (Signed)
 Subjective: Extubated this morning.  Objective: Vital signs in last 24 hours: Temp:  [97.1 F (36.2 C)-97.9 F (36.6 C)] 97.1 F (36.2 C) (08/09 0318) Pulse Rate:  [85-120] 98 (08/09 1100) Resp:  [2-42] 36 (08/09 1100) BP: (69-131)/(50-78) 128/69 (08/09 0800) SpO2:  [93 %-100 %] 99 % (08/09 1100) Arterial Line BP: (169)/(64) 169/64 (08/08 1901) FiO2 (%):  [30 %-100 %] 30 % (08/09 0836) Weight:  [110.6 kg] 110.6 kg (08/09 0500) Weight change: 3.8 kg Last BM Date : 01/14/24  PE: GEN:  Awake, supplemental oxygen  (wants removed) HEENT:  Icteric ABD:  Protuberant, soft, mild generalized discomfort. LUNGS:  Mild respiratory distress (improved from yesterday)  Lab Results: CBC    Component Value Date/Time   WBC 19.6 (H) 01/15/2024 0508   RBC 3.26 (L) 01/15/2024 0508   HGB 8.1 (L) 01/15/2024 0508   HGB 9.6 (L) 01/13/2024 0915   HCT 24.5 (L) 01/15/2024 0508   PLT 76 (L) 01/15/2024 0508   PLT 547 (H) 01/13/2024 0915   MCV 75.2 (L) 01/15/2024 0508   MCH 24.8 (L) 01/15/2024 0508   MCHC 33.1 01/15/2024 0508   RDW 17.2 (H) 01/15/2024 0508   LYMPHSABS 0.8 01/15/2024 0508   MONOABS 0.6 01/15/2024 0508   EOSABS 0.0 01/15/2024 0508   BASOSABS 0.0 01/15/2024 0508  CMP     Component Value Date/Time   NA 132 (L) 01/15/2024 0508   K 3.8 01/15/2024 0508   CL 102 01/15/2024 0508   CO2 17 (L) 01/15/2024 0508   GLUCOSE 161 (H) 01/15/2024 0508   BUN 14 01/15/2024 0508   CREATININE 1.15 (H) 01/15/2024 0508   CREATININE 0.96 01/13/2024 0915   CALCIUM  6.9 (L) 01/15/2024 0508   PROT 5.3 (L) 01/15/2024 0900   ALBUMIN  1.7 (L) 01/15/2024 0900   AST 191 (H) 01/15/2024 0900   AST 46 (H) 01/13/2024 0915   ALT 136 (H) 01/15/2024 0900   ALT 66 (H) 01/13/2024 0915   ALKPHOS 328 (H) 01/15/2024 0900   BILITOT 2.6 (H) 01/15/2024 0900   BILITOT 1.0 01/13/2024 0915   GFR 60.54 12/06/2023 1219   GFRNONAA 50 (L) 01/15/2024 0508   GFRNONAA >60 01/13/2024 0915   Assessment:   Cholangitis from  clogged metal biliary stent, debris removed and another stent placed through existing one via ERCP yesterday.  Plan:   IV antibiotics. Clear liquid diet ok once patient alert enough. Follow LFTs and clinical status. [facility] will follow.   Lindsay Hardy 01/15/2024, 11:45 AM   Cell [phone] If no answer or after 5 PM call [phone]

## 2024-01-15 NOTE — Progress Notes (Signed)
 NAME:  Ronin Crager, MRN:  [id number], DOB:  Sep 29, 1950, LOS: 2 ADMISSION DATE:  01/13/2024, CONSULTATION DATE:  01/14/24 REFERRING MD:  EDP , CHIEF COMPLAINT:     History of Present Illness:  73 year old female with past medical history of CVA, diabetes, hypertension, hyperlipidemia, diverticulosis, obesity who was recently diagnosed with pancreatic cancer status post 2 cycles of gemcitabine /Abraxane  (cycle 2 on 01/13/2024).  Presented to the ER after second cycle infusion with tachycardia.  Family at the time stated that she was feeling cold.  In the ER she was noted to have a heart rate of 150.  Laboratory workup at that time showed mild leukocytosis 10.9, mild LFT elevation (alk phos elevated at 693).  And a lactic acid of 6.7.  CT head, chest, abdomen and pelvis performed which showed evidence of colitis with a hypoenhancing area around the liver.   In the ER the patient became more hypotensive despite aggressive resuscitation with 4 L of IV fluids.  She was started on Levophed  and admitted to the ICU.  She was started on broad-spectrum antibiotics with Vanco and Zosyn .  Overnight her blood cultures grew E. coli and E faecalis, these organisms likely represent a GI source.  GI already consulted. Underwent ERCP and stent placed to drain biliary obstruction   Pertinent  Medical History  has a past medical history of Asthma, CAD (coronary artery disease) (11/07/2003), Cancer (HCC), Chicken pox, CVA (cerebrovascular accident) (HCC) (06/09/1999), Diabetes mellitus, Diastolic dysfunction, Empty sella syndrome (HCC), GERD (gastroesophageal reflux disease), Granulomatous disease, chronic (HCC), H/O bilateral cataract extraction, Hyperlipidemia, Hypertension, NAFLD (nonalcoholic fatty liver disease), OA (osteoarthritis), and Reactive airway disease.   Significant Hospital Events: Including procedures, antibiotic start and stop dates in addition to other pertinent events   Blood cultures growing E fecalis  and E coli Going for ERCP 8/8 8/9 extubated   Interim History / Subjective:  Patient feels weak but otherwise has no complaints.   Objective    Blood pressure 128/69, pulse (!) 102, temperature (!) 97.1 F (36.2 C), temperature source Axillary, resp. rate (!) 28, height 5' 5 (1.651 m), weight 110.6 kg, SpO2 100%.    Vent Mode: PSV;CPAP FiO2 (%):  [30 %-100 %] 30 % Set Rate:  [18 bmp-24 bmp] 24 bmp Vt Set:  [460 mL] 460 mL PEEP:  [5 cmH20] 5 cmH20 Pressure Support:  [5 cmH20] 5 cmH20 Plateau Pressure:  [22 cmH20-24 cmH20] 22 cmH20   Intake/Output Summary (Last 24 hours) at 01/15/2024 0853 Last data filed at 01/15/2024 0700 Gross per 24 hour  Intake 3654.01 ml  Output 700 ml  Net 2954.01 ml   Filed Weights   01/13/24 2140 01/14/24 0500 01/15/24 0500  Weight: 106.8 kg 107.2 kg 110.6 kg    Examination: General: Ill appearing female not in distress  Lungs:  CTAB Cardiovascular: RRR , NL s1,s2  Abdomen: Soft, NT, ND  Extremities: Warm Neuro: Intact    Resolved problem list   Assessment and Plan    Neuro #History of stroke no issuess  - Should be on ASA or plavix at home. Defer to PCP   Cardiovascular #Septic shock #CAD # Type II NSTEMI (flat trops) #Hyperlipidemia #Hypertension Likely from GI source Not on steroids, off pressors today.    Pulmonary #History of asthma Extubated to nasal cannula  -Brovana  and Pulmicort  nebs   GI #Concern for biliary source of infection #Concern for colitis on CT (surgery less concerned) # Transaminitis # Pancreatic cancer  - Underwent ERCP and noted to  have occlusion of biliary stent which has been relieved  - Continue to trend liver enzymes - SLP today and start a clear liquid diet as tolerated   ID # Septic shock # Bacteremia with E. coli and E faecalis - Continue Zosyn , DC Vanco (plan for 7 days from source control) - Appreciate ID recommendations  - Will attempt peripheral IV for her and D/C central line if  able - Order to D/C Port placed with IR. IR said they defer to surgery as it was placed by surgery in July. Surgery will evaluate next week.   Renal # AKI # Hypokalemia # Hypomagnesemia  Heme-onc #Leukocytosis in the setting of infection #Thrombocytopenia (dropped plt significantly to 70s today). This is likely in the setting of sepsis. Unlikely to be HIT or antibiotic related  #Anemia with low MCV, likely due to anemia of chronic disease -Check iron panel - DVT prophylaxis with heparin   Endo  SSI  Hold glipizide  and metformin   - Lantus  10 units tonight    Best Practice (right click and Reselect all SmartList Selections daily)   Diet/type: NPO DVT prophylaxis LMWH Pressure ulcer(s): N/A GI prophylaxis: N/A Lines: N/A Foley:  N/A Code Status:  full code Last date of multidisciplinary goals of care discussion [NA]  Labs   CBC: Recent Labs  Lab 01/13/24 0915 01/13/24 1601 01/14/24 0500 01/14/24 1448 01/14/24 1800 01/15/24 0508  WBC 12.4* 10.9* 27.7*  --   --  19.6*  NEUTROABS 9.2* 7.4  --   --   --  18.2*  HGB 9.6* 11.5* 7.7* 8.8* 8.2* 8.1*  HCT 29.1* 36.2 23.9* 26.0* 24.0* 24.5*  MCV 74.6* 75.9* 76.1*  --   --  75.2*  PLT 547* 575* 296  --   --  76*    Basic Metabolic Panel: Recent Labs  Lab 01/13/24 0915 01/13/24 1601 01/14/24 0500 01/14/24 1448 01/14/24 1800 01/14/24 1834 01/15/24 0508  NA 132* 133* 131* 136 135 133* 132*  K 3.2* 3.6 2.7* 3.4* 4.5 4.2 3.8  CL 94* 94* 99 101  --  101 102  CO2 26 18* 14*  --   --  18* 17*  GLUCOSE 363* 262* 327* 108*  --  138* 161*  BUN 16 17 13 13   --  13 14  CREATININE 0.96 1.14* 1.28* 1.00  --  0.95 1.15*  CALCIUM  8.7* 9.0 6.5*  --   --  7.0* 6.9*  MG  --   --  1.2*  --   --   --  2.8*  PHOS  --   --  3.8  --   --   --   --    GFR: Estimated Creatinine Clearance: 53.1 mL/min (A) (by C-G formula based on SCr of 1.15 mg/dL (H)). Recent Labs  Lab 01/13/24 0915 01/13/24 1601 01/13/24 1602 01/13/24 1802  01/14/24 0500 01/15/24 0508  WBC 12.4* 10.9*  --   --  27.7* 19.6*  LATICACIDVEN  --   --  6.7* 4.9*  --  2.2*    Liver Function Tests: Recent Labs  Lab 01/13/24 0915 01/13/24 1601 01/14/24 0500  AST 46* 230* 354*  ALT 66* 114* 141*  ALKPHOS 385* 693* 331*  BILITOT 1.0 2.1* 2.8*  PROT 6.5 7.2 5.4*  ALBUMIN  2.7* 2.8* <1.5*   Recent Labs  Lab 01/13/24 1601  LIPASE 12   No results for input(s): AMMONIA in the last 168 hours.  ABG    Component Value Date/Time   PHART 7.305 (  L) 01/14/2024 1800   PCO2ART 44.3 01/14/2024 1800   PO2ART 377 (H) 01/14/2024 1800   HCO3 22.0 01/14/2024 1800   TCO2 23 01/14/2024 1800   ACIDBASEDEF 4.0 (H) 01/14/2024 1800   O2SAT 100 01/14/2024 1800     Coagulation Profile: Recent Labs  Lab 01/14/24 0500  INR 2.0*    Cardiac Enzymes: No results for input(s): CKTOTAL, CKMB, CKMBINDEX, TROPONINI in the last 168 hours.  HbA1C: Hgb A1c MFr Bld  Date/Time Value Ref Range Status  11/23/2023 02:59 PM 10.1 (H) 4.6 - 6.5 % Final    Comment:    Glycemic Control Guidelines for People with Diabetes:Non Diabetic:  <6%Goal of Therapy: <7%Additional Action Suggested:  >8%   07/19/2023 03:41 PM 7.0 (H) 4.6 - 6.5 % Final    Comment:    Glycemic Control Guidelines for People with Diabetes:Non Diabetic:  <6%Goal of Therapy: <7%Additional Action Suggested:  >8%     CBG: Recent Labs  Lab 01/14/24 1122 01/14/24 1923 01/14/24 2322 01/15/24 0321 01/15/24 0750  GLUCAP 121* 121* 158* 138* 191*    Review of Systems:   Patient complaining of shortness of breath and leg pain. ROS otherwise negative   Past Medical History:  She,  has a past medical history of Asthma, CAD (coronary artery disease) (11/07/2003), Cancer (HCC), Chicken pox, CVA (cerebrovascular accident) (HCC) (06/09/1999), Diabetes mellitus, Diastolic dysfunction, Empty sella syndrome (HCC), GERD (gastroesophageal reflux disease), Granulomatous disease, chronic (HCC), H/O  bilateral cataract extraction, Hyperlipidemia, Hypertension, NAFLD (nonalcoholic fatty liver disease), OA (osteoarthritis), and Reactive airway disease.   Surgical History:   Past Surgical History:  Procedure Laterality Date   BILIARY BRUSHING  12/08/2023   Procedure: BRUSH BIOPSY, BILE DUCT;  Surgeon: Rosalie Kitchens, MD;  Location: [facility] ENDOSCOPY;  Service: Gastroenterology;;   BILIARY STENT PLACEMENT  12/08/2023   Procedure: INSERTION, STENT, BILE DUCT;  Surgeon: Rosalie Kitchens, MD;  Location: [facility] ENDOSCOPY;  Service: Gastroenterology;;   ORIN MEDIATE RELEASE  1992    left arm 1992   CHOLECYSTECTOMY  2003   ERCP N/A 12/08/2023   Procedure: ERCP, WITH INTERVENTION IF INDICATED;  Surgeon: Rosalie Kitchens, MD;  Location: [facility] ENDOSCOPY;  Service: Gastroenterology;  Laterality: N/A;   PORTACATH PLACEMENT Left 12/27/2023   Procedure: INSERTION, TUNNELED CENTRAL VENOUS DEVICE, WITH PORT;  Surgeon: Dasie Leonor CROME, MD;  Location: [facility] OR;  Service: General;  Laterality: Left;  PORT-A-CATH INSERTION   TONSILLECTOMY AND ADENOIDECTOMY       Social History:   reports that she has quit smoking. Her smoking use included cigarettes. She has a 1 pack-year smoking history. She has never used smokeless tobacco. She reports that she does not drink alcohol  and does not use drugs.   Family History:  Her family history includes Hypertension in her mother and sister; Lung cancer in her mother and sister.   Allergies Allergies  Allergen Reactions   Gabapentin  Shortness Of Breath   Telmisartan  Shortness Of Breath, Swelling and Other (See Comments)    Shortness of breath, foot swelling   Clopidogrel Bisulfate Other (See Comments)    REACTION: weak, tired, sleepy, tightness (6/05); soreness in hip (11/05)   Crestor  [Rosuvastatin  Calcium ] Other (See Comments)    Did not like how she felt on it - stomach upset, nausea   Furosemide Other (See Comments)    REACTION: did not tolerate and preferred HCTZ   Lisinopril Other (See  Comments)    REACTION: cough, right sided pain   Metoprolol  Tartrate Other (See Comments)  REACTION: lightheadedness, h/a cough (7/04); heart gong to stop (7/06); sluggishnes and depressed feeling (2/07); weakness/heaviness 6/07)   Mometasone Furoate Other (See Comments)    REACTION: neck tenderness   Ozempic  (0.25 Or 0.5 Mg-Dose) [Semaglutide (0.25 Or 0.5mg -Dos)] Other (See Comments)    Blurry vision     Home Medications  Prior to Admission medications   Medication Sig Start Date End Date Taking? Authorizing Provider  albuterol  (VENTOLIN  HFA) 108 (90 Base) MCG/ACT inhaler INHALE 2 PUFFS INTO THE LUNGS EVERY 4 (FOUR) HOURS AS NEEDED FOR SHORTNESS OF BREATH. 03/12/23   Geofm Glade PARAS, MD  amLODipine  (NORVASC ) 10 MG tablet Take 1 tablet (10 mg total) by mouth daily. 03/12/23   Geofm Glade PARAS, MD  cyanocobalamin  (VITAMIN B12) 500 MCG tablet Take 500 mcg by mouth daily.    [provider]  fluticasone  (FLONASE ) 50 MCG/ACT nasal spray Place 2 sprays into both nostrils daily. 03/12/23   Geofm Glade PARAS, MD  glipiZIDE  (GLUCOTROL ) 10 MG tablet TAKE 2 TABLETS BY MOUTH 2 TIMES DAILY BEFORE A MEAL. 03/12/23   Geofm Glade PARAS, MD  hydrochlorothiazide  (HYDRODIURIL ) 25 MG tablet Take 1 tablet (25 mg total) by mouth daily. 11/23/23   Geofm Glade PARAS, MD  HYDROcodone -acetaminophen  (NORCO) 10-325 MG tablet Take 1-2 tablets by mouth every 4 (four) hours as needed for moderate pain (pain score 4-6) or severe pain (pain score 7-10). 12/13/23   Patsy Lenis, MD  ibuprofen  (ADVIL ) 800 MG tablet TAKE 1 TABLET BY MOUTH TWICE DAILY AS NEEDED FOR MODERATE PAIN 11/30/23   Geofm Glade PARAS, MD  insulin  glargine (LANTUS ) 100 UNIT/ML Solostar Pen Inject 20 Units into the skin daily. 11/30/23   Geofm Glade PARAS, MD  lidocaine -prilocaine  (EMLA ) cream Apply 1 Application topically as needed (Apply to Port 1-2 hours prior to its use start using with 2nd treatment). Patient not taking: No sig reported 12/24/23   Cloretta Arley NOVAK, MD   linaclotide  (LINZESS ) 145 MCG CAPS capsule TAKE 1 CAPSULE BY MOUTH DAILY BEFORE BREAKFAST. Patient not taking: Reported on 12/07/2023 03/12/23   Geofm Glade PARAS, MD  metFORMIN  (GLUCOPHAGE -XR) 500 MG 24 hr tablet TAKE 2 TABLETS BY MOUTH TWICE DAILY BEFORE A MEAL Patient taking differently: Take 500 mg by mouth 2 (two) times daily with a meal. TAKE 2 TABLETS BY MOUTH TWICE DAILY BEFORE A MEAL 09/23/23   Burns, Glade PARAS, MD  montelukast  (SINGULAIR ) 10 MG tablet TAKE 1 TABLET BY MOUTH EVRY DAY AT BEDTIME 09/23/23   Geofm Glade PARAS, MD  naloxone  (NARCAN ) nasal spray 4 mg/0.1 mL 1 spray in one nostril x1; may repeat dose in alternate nostrils q2-3min prn Patient not taking: No sig reported 03/07/23   Geofm Glade PARAS, MD  ondansetron  (ZOFRAN ) 8 MG tablet Take 1 tablet (8 mg total) by mouth every 8 (eight) hours as needed. Patient not taking: No sig reported 12/24/23   Cloretta Arley NOVAK, MD  potassium chloride  (MICRO-K ) 10 MEQ CR capsule TAKE 1 CAPSULE BY MOUTH TWICE A DAY 06/07/23   Geofm Glade PARAS, MD  prochlorperazine  (COMPAZINE ) 10 MG tablet Take 1 tablet (10 mg total) by mouth every 6 (six) hours as needed for nausea or vomiting. Patient not taking: No sig reported 12/24/23   Cloretta Arley NOVAK, MD     Critical care time: 38    The patient is critically ill due to septic shock from a GI source.  Critical care was necessary to treat or prevent imminent or life-threatening deterioration. Critical care time was spent  by me on the following activities: development of a treatment plan with the patient and/or surrogate as well as nursing, discussions with consultants, evaluation of the patient's response to treatment, examination of the patient, obtaining a history from the patient or surrogate, ordering and performing treatments and interventions, ordering and review of laboratory studies, ordering and review of radiographic studies, review of telemetry data including pulse oximetry, re-evaluation of patient's condition  and participation in multidisciplinary rounds.   I personally spent 33 minutes providing critical care not including any separately billable procedures.   Zola LOISE Herter, MD [facility] 01/15/2024 8:53 AM

## 2024-01-16 ENCOUNTER — Inpatient Hospital Stay (HOSPITAL_COMMUNITY)

## 2024-01-16 DIAGNOSIS — R0600 Dyspnea, unspecified: Secondary | ICD-10-CM

## 2024-01-16 LAB — COMPREHENSIVE METABOLIC PANEL WITH GFR
ALT: 118 U/L — ABNORMAL HIGH (ref 0–44)
ALT: 128 U/L — ABNORMAL HIGH (ref 0–44)
AST: 111 U/L — ABNORMAL HIGH (ref 15–41)
AST: 119 U/L — ABNORMAL HIGH (ref 15–41)
Albumin: 1.5 g/dL — ABNORMAL LOW (ref 3.5–5.0)
Albumin: 1.7 g/dL — ABNORMAL LOW (ref 3.5–5.0)
Alkaline Phosphatase: 349 U/L — ABNORMAL HIGH (ref 38–126)
Alkaline Phosphatase: 377 U/L — ABNORMAL HIGH (ref 38–126)
Anion gap: 10 (ref 5–15)
Anion gap: 13 (ref 5–15)
BUN: 29 mg/dL — ABNORMAL HIGH (ref 8–23)
BUN: 30 mg/dL — ABNORMAL HIGH (ref 8–23)
CO2: 20 mmol/L — ABNORMAL LOW (ref 22–32)
CO2: 17 mmol/L — ABNORMAL LOW (ref 22–32)
Calcium: 7 mg/dL — ABNORMAL LOW (ref 8.9–10.3)
Calcium: 7 mg/dL — ABNORMAL LOW (ref 8.9–10.3)
Chloride: 103 mmol/L (ref 98–111)
Chloride: 103 mmol/L (ref 98–111)
Creatinine, Ser: 1.41 mg/dL — ABNORMAL HIGH (ref 0.44–1.00)
Creatinine, Ser: 1.36 mg/dL — ABNORMAL HIGH (ref 0.44–1.00)
GFR, Estimated: 39 mL/min — ABNORMAL LOW (ref >60)
GFR, Estimated: 41 mL/min — ABNORMAL LOW (ref >60)
Glucose, Bld: 211 mg/dL — ABNORMAL HIGH (ref 70–99)
Glucose, Bld: 237 mg/dL — ABNORMAL HIGH (ref 70–99)
Potassium: 3.6 mmol/L (ref 3.5–5.1)
Potassium: 3.9 mmol/L (ref 3.5–5.1)
Sodium: 133 mmol/L — ABNORMAL LOW (ref 135–145)
Sodium: 133 mmol/L — ABNORMAL LOW (ref 135–145)
Total Bilirubin: 2.4 mg/dL — ABNORMAL HIGH (ref 0.0–1.2)
Total Bilirubin: 2.5 mg/dL — ABNORMAL HIGH (ref 0.0–1.2)
Total Protein: 5.2 g/dL — ABNORMAL LOW (ref 6.5–8.1)
Total Protein: 5.7 g/dL — ABNORMAL LOW (ref 6.5–8.1)

## 2024-01-16 LAB — GLUCOSE, CAPILLARY
Glucose-Capillary: 189 mg/dL — ABNORMAL HIGH (ref 70–99)
Glucose-Capillary: 190 mg/dL — ABNORMAL HIGH (ref 70–99)
Glucose-Capillary: 227 mg/dL — ABNORMAL HIGH (ref 70–99)
Glucose-Capillary: 231 mg/dL — ABNORMAL HIGH (ref 70–99)
Glucose-Capillary: 244 mg/dL — ABNORMAL HIGH (ref 70–99)
Glucose-Capillary: 247 mg/dL — ABNORMAL HIGH (ref 70–99)

## 2024-01-16 LAB — CBC WITH DIFFERENTIAL/PLATELET
Abs Immature Granulocytes: 0.17 K/uL — ABNORMAL HIGH (ref 0.00–0.07)
Basophils Absolute: 0.1 K/uL (ref 0.0–0.1)
Basophils Relative: 1 %
Eosinophils Absolute: 0 K/uL (ref 0.0–0.5)
Eosinophils Relative: 0 %
HCT: 28.7 % — ABNORMAL LOW (ref 36.0–46.0)
Hemoglobin: 9.2 g/dL — ABNORMAL LOW (ref 12.0–15.0)
Immature Granulocytes: 3 %
Lymphocytes Relative: 4 %
Lymphs Abs: 0.2 K/uL — ABNORMAL LOW (ref 0.7–4.0)
MCH: 24.3 pg — ABNORMAL LOW (ref 26.0–34.0)
MCHC: 32.1 g/dL (ref 30.0–36.0)
MCV: 75.7 fL — ABNORMAL LOW (ref 80.0–100.0)
Monocytes Absolute: 0.2 K/uL (ref 0.1–1.0)
Monocytes Relative: 4 %
Neutro Abs: 5.7 K/uL (ref 1.7–7.7)
Neutrophils Relative %: 88 %
Platelets: 34 K/uL — ABNORMAL LOW (ref 150–400)
RBC: 3.79 MIL/uL — ABNORMAL LOW (ref 3.87–5.11)
RDW: 17.4 % — ABNORMAL HIGH (ref 11.5–15.5)
WBC: 6.4 K/uL (ref 4.0–10.5)
nRBC: 0 % (ref 0.0–0.2)

## 2024-01-16 LAB — POCT I-STAT 7, (LYTES, BLD GAS, ICA,H+H)
Acid-base deficit: 9 mmol/L — ABNORMAL HIGH (ref 0.0–2.0)
Bicarbonate: 17.1 mmol/L — ABNORMAL LOW (ref 20.0–28.0)
Calcium, Ion: 1.02 mmol/L — ABNORMAL LOW (ref 1.15–1.40)
HCT: 28 % — ABNORMAL LOW (ref 36.0–46.0)
Hemoglobin: 9.5 g/dL — ABNORMAL LOW (ref 12.0–15.0)
O2 Saturation: 93 %
Patient temperature: 36.7
Potassium: 3.7 mmol/L (ref 3.5–5.1)
Sodium: 133 mmol/L — ABNORMAL LOW (ref 135–145)
TCO2: 18 mmol/L — ABNORMAL LOW (ref 22–32)
pCO2 arterial: 35.1 mmHg (ref 32–48)
pH, Arterial: 7.294 — ABNORMAL LOW (ref 7.35–7.45)
pO2, Arterial: 73 mmHg — ABNORMAL LOW (ref 83–108)

## 2024-01-16 LAB — TROPONIN I (HIGH SENSITIVITY)
Troponin I (High Sensitivity): 79 ng/L — ABNORMAL HIGH (ref <18)
Troponin I (High Sensitivity): 77 ng/L — ABNORMAL HIGH (ref <18)

## 2024-01-16 LAB — CBC
HCT: 24.7 % — ABNORMAL LOW (ref 36.0–46.0)
Hemoglobin: 8 g/dL — ABNORMAL LOW (ref 12.0–15.0)
MCH: 24.4 pg — ABNORMAL LOW (ref 26.0–34.0)
MCHC: 32.4 g/dL (ref 30.0–36.0)
MCV: 75.3 fL — ABNORMAL LOW (ref 80.0–100.0)
Platelets: 35 K/uL — ABNORMAL LOW (ref 150–400)
RBC: 3.28 MIL/uL — ABNORMAL LOW (ref 3.87–5.11)
RDW: 17.2 % — ABNORMAL HIGH (ref 11.5–15.5)
WBC: 7.7 K/uL (ref 4.0–10.5)
nRBC: 0 % (ref 0.0–0.2)

## 2024-01-16 MED ORDER — LACTATED RINGERS IV BOLUS
500.0000 mL | Freq: Once | INTRAVENOUS | Status: AC
  Administered 2024-01-16: 500 mL via INTRAVENOUS

## 2024-01-16 MED ORDER — METOPROLOL TARTRATE 5 MG/5ML IV SOLN: 5.0000 mg | INTRAVENOUS | Status: DC | PRN

## 2024-01-16 MED ORDER — ACETAMINOPHEN 10 MG/ML IV SOLN
1000.0000 mg | Freq: Four times a day (QID) | INTRAVENOUS | Status: DC
  Administered 2024-01-16 – 2024-01-17 (×5): 1000 mg via INTRAVENOUS
  Filled 2024-01-16 (×5): qty 100

## 2024-01-16 MED ORDER — METOPROLOL TARTRATE 5 MG/5ML IV SOLN
INTRAVENOUS | Status: AC
  Filled 2024-01-16: qty 5

## 2024-01-16 MED ORDER — METHYLPREDNISOLONE SODIUM SUCC 125 MG IJ SOLR
60.0000 mg | Freq: Two times a day (BID) | INTRAMUSCULAR | Status: DC
  Administered 2024-01-16 – 2024-01-17 (×4): 60 mg via INTRAVENOUS
  Filled 2024-01-16 (×3): qty 2

## 2024-01-16 MED ORDER — AMIODARONE HCL IN DEXTROSE 360-4.14 MG/200ML-% IV SOLN
30.0000 mg/h | INTRAVENOUS | Status: DC
  Administered 2024-01-16 (×2): 60 mg/h via INTRAVENOUS
  Administered 2024-01-17: 30 mg/h via INTRAVENOUS
  Administered 2024-01-17: 60 mg/h via INTRAVENOUS
  Administered 2024-01-17: 30 mg/h via INTRAVENOUS
  Administered 2024-01-17: 60 mg/h via INTRAVENOUS
  Filled 2024-01-16 (×3): qty 200

## 2024-01-16 MED ORDER — CHLORHEXIDINE GLUCONATE CLOTH 2 % EX PADS
6.0000 | MEDICATED_PAD | Freq: Every evening | CUTANEOUS | Status: DC
  Administered 2024-01-16 – 2024-01-17 (×3): 6 via TOPICAL

## 2024-01-16 MED ORDER — METHOCARBAMOL 1000 MG/10ML IJ SOLN
500.0000 mg | Freq: Three times a day (TID) | INTRAMUSCULAR | Status: DC
  Administered 2024-01-16 – 2024-01-17 (×8): 500 mg via INTRAVENOUS
  Filled 2024-01-16 (×2): qty 5
  Filled 2024-01-16: qty 10
  Filled 2024-01-16: qty 5
  Filled 2024-01-16: qty 10

## 2024-01-16 MED ORDER — INSULIN ASPART 100 UNIT/ML IJ SOLN
0.0000 [IU] | INTRAMUSCULAR | Status: DC
  Administered 2024-01-16 – 2024-01-17 (×4): 7 [IU] via SUBCUTANEOUS
  Administered 2024-01-17 (×2): 3 [IU] via SUBCUTANEOUS
  Administered 2024-01-17: 4 [IU] via SUBCUTANEOUS
  Administered 2024-01-17: 7 [IU] via SUBCUTANEOUS
  Administered 2024-01-17 (×3): 4 [IU] via SUBCUTANEOUS

## 2024-01-16 MED ORDER — INSULIN GLARGINE-YFGN 100 UNIT/ML ~~LOC~~ SOLN
15.0000 [IU] | Freq: Every day | SUBCUTANEOUS | Status: DC
  Administered 2024-01-16 – 2024-01-17 (×3): 15 [IU] via SUBCUTANEOUS
  Filled 2024-01-16 (×3): qty 0.15

## 2024-01-16 MED ORDER — AMIODARONE HCL IN DEXTROSE 360-4.14 MG/200ML-% IV SOLN
60.0000 mg/h | INTRAVENOUS | Status: AC
  Administered 2024-01-16: 60 mg/h via INTRAVENOUS
  Filled 2024-01-16 (×2): qty 200

## 2024-01-16 MED ORDER — LIDOCAINE 5 % EX PTCH
1.0000 | MEDICATED_PATCH | CUTANEOUS | Status: DC
  Administered 2024-01-16 – 2024-01-17 (×3): 1 via TRANSDERMAL
  Filled 2024-01-16 (×2): qty 1

## 2024-01-16 MED ORDER — NOREPINEPHRINE 4 MG/250ML-% IV SOLN
0.0000 ug/min | INTRAVENOUS | Status: DC
  Administered 2024-01-16 (×2): 15 ug/min via INTRAVENOUS
  Filled 2024-01-16 (×2): qty 250

## 2024-01-16 MED ORDER — POLYETHYLENE GLYCOL 3350 17 G PO PACK
17.0000 g | PACK | Freq: Every day | ORAL | Status: DC
  Administered 2024-01-16: 17 g via ORAL
  Filled 2024-01-16: qty 1

## 2024-01-16 MED ORDER — NOREPINEPHRINE 16 MG/250ML-% IV SOLN
0.0000 ug/min | INTRAVENOUS | Status: DC
  Administered 2024-01-16 – 2024-01-17 (×3): 15 ug/min via INTRAVENOUS
  Filled 2024-01-16 (×2): qty 250

## 2024-01-16 MED ORDER — INSULIN GLARGINE-YFGN 100 UNIT/ML ~~LOC~~ SOLN
5.0000 [IU] | Freq: Once | SUBCUTANEOUS | Status: AC
  Administered 2024-01-16: 5 [IU] via SUBCUTANEOUS
  Filled 2024-01-16: qty 0.05

## 2024-01-16 MED ORDER — CARMEX CLASSIC LIP BALM EX OINT
TOPICAL_OINTMENT | CUTANEOUS | Status: DC | PRN
  Filled 2024-01-16: qty 10

## 2024-01-16 MED ORDER — SODIUM BICARBONATE 8.4 % IV SOLN
100.0000 meq | Freq: Once | INTRAVENOUS | Status: AC
  Administered 2024-01-16: 100 meq via INTRAVENOUS
  Filled 2024-01-16: qty 50

## 2024-01-16 MED ORDER — FENTANYL CITRATE PF 50 MCG/ML IJ SOSY
25.0000 ug | PREFILLED_SYRINGE | INTRAMUSCULAR | Status: DC | PRN
  Administered 2024-01-16 – 2024-01-17 (×8): 25 ug via INTRAVENOUS
  Filled 2024-01-16 (×6): qty 1

## 2024-01-16 MED ORDER — METOPROLOL TARTRATE 5 MG/5ML IV SOLN
INTRAVENOUS | Status: AC
  Administered 2024-01-16: 5 mg via INTRAVENOUS
  Filled 2024-01-16: qty 5

## 2024-01-16 MED ORDER — NOREPINEPHRINE 4 MG/250ML-% IV SOLN
INTRAVENOUS | Status: AC
Start: 2024-01-16 — End: 2024-01-16
  Administered 2024-01-16: 2 ug/min via INTRAVENOUS
  Filled 2024-01-16: qty 250

## 2024-01-16 NOTE — Progress Notes (Addendum)
 eLink Physician-Brief Progress Note Patient Name: Marbeth Smedley DOB: [birth year] MRN: [id number]   Date of Service  01/16/2024  HPI/Events of Note  Reported to have distant lung sounds, RR 26 Pt 7 liters positive, with only UO today.  Remains hypotensive, on levophed  15mcg/min  eICU Interventions  Will check CXR, ABG If with worsening pulmonary edema, will offer trial of NIPPV        Lorin Hauck M DELA CRUZ 01/16/2024, 7:44 PM  10:51 PM CXR shows only mild vascular congestion ABG shows metabolic acidosis, pO2 73.  Will give patient sodium bicarb, place on trial of high flow O2 to improve oxygenation Will continue to monitor closely.   4:11 AM Notified that plts 19 on AM labs.  No note of active bleeding. Hgb stable.  Pt has been off chemical anticoagulation.  Thrombocytopenia likely due to ongoing sepsis.  Will continue to monitor VS, CBC.  Will check coags with next set of labs as well.

## 2024-01-16 NOTE — Progress Notes (Signed)
 2 Days Post-Op   Subjective/Chief Complaint: No complaints   Objective: Vital signs in last 24 hours: Temp:  [97.1 F (36.2 C)-98 F (36.7 C)] 98 F (36.7 C) (08/10 0738) Pulse Rate:  [91-112] 111 (08/10 0900) Resp:  [16-65] 29 (08/10 0900) BP: (90-133)/(52-102) 97/59 (08/10 0900) SpO2:  [92 %-100 %] 96 % (08/10 0900) FiO2 (%):  [30 %-36 %] 32 % (08/10 0810) Weight:  [107.1 kg] 107.1 kg (08/10 0337) Last BM Date : 01/14/24  Intake/Output from previous day: 08/09 0701 - 08/10 0700 In: 295.4 [I.V.:43.9; IV Piggyback:251.5] Out: 160 [Urine:160] Intake/Output this shift: Total I/O In: 526 [IV Piggyback:526] Out: -   General appearance: alert and cooperative Resp: clear to auscultation bilaterally Cardio: regular rate and rhythm GI: soft, nontender  Lab Results:  Recent Labs    01/15/24 0508 01/16/24 0333  WBC 19.6* 7.7  HGB 8.1* 8.0*  HCT 24.5* 24.7*  PLT 76* 35*   BMET Recent Labs    01/15/24 0508 01/16/24 0826  NA 132* 133*  K 3.8 3.6  CL 102 103  CO2 17* 20*  GLUCOSE 161* 211*  BUN 14 29*  CREATININE 1.15* 1.41*  CALCIUM  6.9* 7.0*   PT/INR Recent Labs    01/14/24 0500  LABPROT 23.7*  INR 2.0*   ABG Recent Labs    01/14/24 1800  PHART 7.305*  HCO3 22.0    Studies/Results: ECHOCARDIOGRAM COMPLETE Result Date: 01/14/2024    ECHOCARDIOGRAM REPORT   Patient Name:   Lindsay Hardy Date of Exam: 01/14/2024 Medical Rec #:  [id number]     Height:       65.0 in Accession #:    [id number]    Weight:       236.3 lb Date of Birth:  1950/12/13    BSA:          2.124 m Patient Age:    72 years      BP:           102/68 mmHg Patient Gender: F             HR:           112 bpm. Exam Location:  Inpatient Procedure: 2D Echo (Both Spectral and Color Flow Doppler were utilized during            procedure). Indications:    shock  History:        Patient has prior history of Echocardiogram examinations, most                 recent 10/16/2004. CAD, PAD,  Signs/Symptoms:Bacteremia; Risk                 Factors:Dyslipidemia, Diabetes and Hypertension.  Sonographer:    Tinnie Barefoot RDCS Referring Phys: [id number] [id number] ORN [facility]  Sonographer Comments: Patient is obese and suboptimal subcostal window. Image acquisition challenging due to patient body habitus. IMPRESSIONS  1. Left ventricular ejection fraction, by estimation, is 50 to 55%. The left ventricle has low normal function. The left ventricle has no regional wall motion abnormalities. There is mild left ventricular hypertrophy. Left ventricular diastolic parameters are indeterminate.  2. Right ventricular systolic function is normal. The right ventricular size is normal.  3. The mitral valve is normal in structure. Trivial mitral valve regurgitation.  4. The aortic valve is tricuspid. Aortic valve regurgitation is not visualized. Aortic valve sclerosis is present, with no evidence of aortic valve stenosis. FINDINGS  Left Ventricle: Left ventricular ejection fraction, by estimation,  is 50 to 55%. The left ventricle has low normal function. The left ventricle has no regional wall motion abnormalities. Definity  contrast agent was given IV to delineate the left ventricular endocardial borders. The left ventricular internal cavity size was normal in size. There is mild left ventricular hypertrophy. Left ventricular diastolic parameters are indeterminate. Right Ventricle: The right ventricular size is normal. Right vetricular wall thickness was not assessed. Right ventricular systolic function is normal. Left Atrium: Left atrial size was normal in size. Right Atrium: Right atrial size was normal in size. Pericardium: Trivial pericardial effusion is present. Mitral Valve: The mitral valve is normal in structure. Trivial mitral valve regurgitation. Tricuspid Valve: The tricuspid valve is grossly normal. Tricuspid valve regurgitation is trivial. Aortic Valve: The aortic valve is tricuspid. Aortic valve regurgitation is not  visualized. Aortic valve sclerosis is present, with no evidence of aortic valve stenosis. Pulmonic Valve: The pulmonic valve was normal in structure. Pulmonic valve regurgitation is not visualized. Aorta: The aortic root and ascending aorta are structurally normal, with no evidence of dilitation. IAS/Shunts: No atrial level shunt detected by color flow Doppler.  LEFT VENTRICLE PLAX 2D LVIDd:         4.00 cm   Diastology LVIDs:         2.80 cm   LV e' medial:  6.64 cm/s LV PW:         1.10 cm   LV e' lateral: 9.57 cm/s LV IVS:        1.20 cm LVOT diam:     1.90 cm LV SV:         42 LV SV Index:   20 LVOT Area:     2.84 cm  RIGHT VENTRICLE RV Basal diam:  2.90 cm RV S prime:     14.80 cm/s TAPSE (M-mode): 1.7 cm LEFT ATRIUM             Index        RIGHT ATRIUM           Index LA diam:        3.10 cm 1.46 cm/m   RA Area:     12.00 cm LA Vol (A2C):   51.8 ml 24.39 ml/m  RA Volume:   26.90 ml  12.67 ml/m LA Vol (A4C):   35.7 ml 16.81 ml/m LA Biplane Vol: 46.7 ml 21.99 ml/m  AORTIC VALVE LVOT Vmax:   95.60 cm/s LVOT Vmean:  63.300 cm/s LVOT VTI:    0.147 m  AORTA Ao Root diam: 3.00 cm Ao Asc diam:  3.40 cm MV A velocity: 117.00 cm/s                             SHUNTS                             Systemic VTI:  0.15 m                             Systemic Diam: 1.90 cm Vina Gull MD Electronically signed by Vina Gull MD Signature Date/Time: 01/14/2024/12:34:00 PM    Final     Anti-infectives: Anti-infectives (From admission, onward)    Start     Dose/Rate Route Frequency Ordered Stop   01/14/24 2300  vancomycin  (VANCOCIN ) IVPB 1000 mg/200 mL premix  Status:  Discontinued  1,000 mg 200 mL/hr over 60 Minutes Intravenous Every 24 hours 01/13/24 2226 01/14/24 0740   01/13/24 2315  piperacillin -tazobactam (ZOSYN ) IVPB 3.375 g        3.375 g 12.5 mL/hr over 240 Minutes Intravenous Every 8 hours 01/13/24 2226     01/13/24 1815  vancomycin  (VANCOCIN ) IVPB 1000 mg/200 mL premix       Placed in Followed by  Linked Group   1,000 mg 200 mL/hr over 60 Minutes Intravenous  Once 01/13/24 1710 01/14/24 0006   01/13/24 1745  metroNIDAZOLE  (FLAGYL ) IVPB 500 mg        500 mg 100 mL/hr over 60 Minutes Intravenous  Once 01/13/24 1736 01/13/24 1901   01/13/24 1715  vancomycin  (VANCOCIN ) IVPB 1000 mg/200 mL premix       Placed in Followed by Linked Group   1,000 mg 200 mL/hr over 60 Minutes Intravenous  Once 01/13/24 1710 01/13/24 2200   01/13/24 1700  ceFEPIme  (MAXIPIME ) 2 g in sodium chloride  0.9 % 100 mL IVPB        2 g 200 mL/hr over 30 Minutes Intravenous  Once 01/13/24 1653 01/13/24 1817   01/13/24 1700  vancomycin  (VANCOCIN ) IVPB 1000 mg/200 mL premix  Status:  Discontinued        1,000 mg 200 mL/hr over 60 Minutes Intravenous  Once 01/13/24 1653 01/13/24 1710   01/13/24 1700  vancomycin  (VANCOREADY) IVPB 2000 mg/400 mL  Status:  Discontinued        2,000 mg 200 mL/hr over 120 Minutes Intravenous  Once 01/13/24 1657 01/13/24 1710       Assessment/Plan: s/p Procedure(s): ERCP, WITH INTERVENTION IF INDICATED (N/A) INSERTION, STENT, BILE DUCT Continue abx for cholangitis Pancreatic cancer Can consider removing port if absolutely necessary.  Will follow  LOS: 3 days    Deward Null III 01/16/2024

## 2024-01-16 NOTE — Plan of Care (Signed)

## 2024-01-16 NOTE — Progress Notes (Signed)
 eLink Physician-Brief Progress Note Patient Name: Lindsay Hardy DOB: [birth year] MRN: [id number]   Date of Service  01/16/2024  HPI/Events of Note  BP low with MAP around 60 and decreased UOP  eICU Interventions  Fluid bolus ordered     Intervention Category Intermediate Interventions: Hypotension - evaluation and management  CLAUDENE AGENT, P 01/16/2024, 6:22 AM

## 2024-01-16 NOTE — Plan of Care (Signed)
   Problem: Health Behavior/Discharge Planning: Goal: Ability to manage health-related needs will improve Outcome: Progressing   Problem: Clinical Measurements: Goal: Ability to maintain clinical measurements within normal limits will improve Outcome: Progressing Goal: Will remain free from infection Outcome: Progressing Goal: Diagnostic test results will improve Outcome: Progressing

## 2024-01-16 NOTE — Progress Notes (Addendum)
 NAME:  Lindsay Hardy, MRN:  [id number], DOB:  1950/12/05, LOS: 3 ADMISSION DATE:  01/13/2024, CONSULTATION DATE:  01/14/24 REFERRING MD:  EDP , CHIEF COMPLAINT:     History of Present Illness:  73 year old female with past medical history of CVA, diabetes, hypertension, hyperlipidemia, diverticulosis, obesity who was recently diagnosed with pancreatic cancer status post 2 cycles of gemcitabine /Abraxane  (cycle 2 on 01/13/2024).  Presented to the ER after second cycle infusion with tachycardia.  Family at the time stated that she was feeling cold.  In the ER she was noted to have a heart rate of 150.  Laboratory workup at that time showed mild leukocytosis 10.9, mild LFT elevation (alk phos elevated at 693).  And a lactic acid of 6.7.  CT head, chest, abdomen and pelvis performed which showed evidence of colitis with a hypoenhancing area around the liver.   In the ER the patient became more hypotensive despite aggressive resuscitation with 4 L of IV fluids.  She was started on Levophed  and admitted to the ICU.  She was started on broad-spectrum antibiotics with Vanco and Zosyn .  Overnight her blood cultures grew E. coli and E faecalis, these organisms likely represent a GI source.  GI already consulted. Underwent ERCP and stent placed to drain biliary obstruction.    Pertinent  Medical History  has a past medical history of Asthma, CAD (coronary artery disease) (11/07/2003), Cancer (HCC), Chicken pox, CVA (cerebrovascular accident) (HCC) (06/09/1999), Diabetes mellitus, Diastolic dysfunction, Empty sella syndrome (HCC), GERD (gastroesophageal reflux disease), Granulomatous disease, chronic (HCC), H/O bilateral cataract extraction, Hyperlipidemia, Hypertension, NAFLD (nonalcoholic fatty liver disease), OA (osteoarthritis), and Reactive airway disease.   Significant Hospital Events: Including procedures, antibiotic start and stop dates in addition to other pertinent events   Blood cultures growing E  fecalis and E coli Going for ERCP 8/8 8/9 extubated    Interim History / Subjective:  Remains tachypnic and complains of shortness of breath   Objective    Blood pressure (!) 90/52, pulse 98, temperature 98 F (36.7 C), temperature source Oral, resp. rate (!) 30, height 5' 5 (1.651 m), weight 107.1 kg, SpO2 100%.    FiO2 (%):  [30 %-36 %] 32 %   Intake/Output Summary (Last 24 hours) at 01/16/2024 0814 Last data filed at 01/16/2024 9348 Gross per 24 hour  Intake 245.65 ml  Output 160 ml  Net 85.65 ml   Filed Weights   01/14/24 0500 01/15/24 0500 01/16/24 9662  Weight: 107.2 kg 110.6 kg 107.1 kg    Examination: General: Ill appearing female in moderate distress  Lungs:  tachypnea, end expiratory wheeze  Cardiovascular: RRR , NL s1,s2, Abdomen: Soft, NT, ND  Extremities: Warm Neuro: Intact    Resolved problem list   Assessment and Plan    Neuro #History of stroke no issuess  - Should be on ASA or plavix at home. Defer to PCP   Cardiovascular #Septic shock #CAD # Type II NSTEMI (flat trops) #Afib RVR 8/10 #Hyperlipidemia #Hypertension Septic shock with bacteremia in the setting of biliary obstruction.  - Went into afib RVR today, BP was soft, 1 single dose of 5mg  IV lopressor  and she dropped her blood pressure and became more drowsy, complaining of headache and trying to get out of bed. Started back on levophed  and IV amiodarone  to control RVR. Breathing appears labored and called son to update about possible intubation. He is aware and would like to proceed with intubation if needed    Pulmonary #History of asthma  Noted to be wheezing 8/10 and complaining of shortness of breath Extubated to nasal cannula  - Added systemic steroids given wheezing  -Brovana  and Pulmicort  nebs   GI #Concern for biliary source of infection #Concern for colitis on CT (surgery less concerned) # Transaminitis # Pancreatic cancer  #Possible liver abscess  - Underwent ERCP  and noted to have occlusion of biliary stent, a new stent was placed inside the first one to avoid risk of bleeding. Her liver enzymes were downtrending but today they are slightly higher  - Continue to trend liver enzymes - NPO given clinical worsening  - Can consider touching base with IR regarding possible drainage if she continues to be septic    ID # Septic shock # Bacteremia with E. coli and E faecalis - Continue Zosyn , DC Vanco (plan for 7 days from source control) - Appreciate ID recommendations  - Order to D/C Port placed with IR. IR said they defer to surgery as it was placed by surgery in July. Surgery will evaluate next week.  Given ongoing sepsis, prefer port to be explanted if possible   Renal # AKI # Hypokalemia # Hypomagnesemia  Heme-onc #Leukocytosis in the setting of infection, leukopenia today  #Thrombocytopenia (dropped plt significantly to 70s today). This is likely in the setting of sepsis. Unlikely to be HIT or antibiotic related. Could also be related to her 2nd cycle of chemo with gemcitabine /abraxane  which can cause thrombocytopnea as well as leukopenia  #Anemia with low MCV, likely due to anemia of chronic disease - iron panel, no iron deficiency  - DVT prophylaxis  held due to thrombocytopenia   Endo  #DM (started also on steroids, will add more insulin  coverage) SSI  Hold glipizide  and metformin   - 5 units lantus  now  - increase Lantus  15 units tonight    Best Practice (right click and Reselect all SmartList Selections daily)   Diet/type: NPO DVT prophylaxis Held - low plt  Pressure ulcer(s): N/A GI prophylaxis: N/A Lines: N/A Foley:  N/A Code Status:  full code Last date of multidisciplinary goals of care discussion [NA]  Labs   CBC: Recent Labs  Lab 01/13/24 0915 01/13/24 0915 01/13/24 1601 01/14/24 0500 01/14/24 1448 01/14/24 1800 01/15/24 0508 01/16/24 0333  WBC 12.4*  --  10.9* 27.7*  --   --  19.6* 7.7  NEUTROABS 9.2*  --   7.4  --   --   --  18.2*  --   HGB 9.6*   < > 11.5* 7.7* 8.8* 8.2* 8.1* 8.0*  HCT 29.1*  --  36.2 23.9* 26.0* 24.0* 24.5* 24.7*  MCV 74.6*  --  75.9* 76.1*  --   --  75.2* 75.3*  PLT 547*  --  575* 296  --   --  76* 35*   < > = values in this interval not displayed.    Basic Metabolic Panel: Recent Labs  Lab 01/13/24 0915 01/13/24 1601 01/14/24 0500 01/14/24 1448 01/14/24 1800 01/14/24 1834 01/15/24 0508  NA 132* 133* 131* 136 135 133* 132*  K 3.2* 3.6 2.7* 3.4* 4.5 4.2 3.8  CL 94* 94* 99 101  --  101 102  CO2 26 18* 14*  --   --  18* 17*  GLUCOSE 363* 262* 327* 108*  --  138* 161*  BUN 16 17 13 13   --  13 14  CREATININE 0.96 1.14* 1.28* 1.00  --  0.95 1.15*  CALCIUM  8.7* 9.0 6.5*  --   --  7.0* 6.9*  MG  --   --  1.2*  --   --   --  2.8*  PHOS  --   --  3.8  --   --   --   --    GFR: Estimated Creatinine Clearance: 52.2 mL/min (A) (by C-G formula based on SCr of 1.15 mg/dL (H)). Recent Labs  Lab 01/13/24 1601 01/13/24 1602 01/13/24 1802 01/14/24 0500 01/15/24 0508 01/16/24 0333  WBC 10.9*  --   --  27.7* 19.6* 7.7  LATICACIDVEN  --  6.7* 4.9*  --  2.2*  --     Liver Function Tests: Recent Labs  Lab 01/13/24 0915 01/13/24 1601 01/14/24 0500 01/15/24 0900  AST 46* 230* 354* 191*  ALT 66* 114* 141* 136*  ALKPHOS 385* 693* 331* 328*  BILITOT 1.0 2.1* 2.8* 2.6*  PROT 6.5 7.2 5.4* 5.3*  ALBUMIN  2.7* 2.8* <1.5* 1.7*   Recent Labs  Lab 01/13/24 1601  LIPASE 12   No results for input(s): AMMONIA in the last 168 hours.  ABG    Component Value Date/Time   PHART 7.305 (L) 01/14/2024 1800   PCO2ART 44.3 01/14/2024 1800   PO2ART 377 (H) 01/14/2024 1800   HCO3 22.0 01/14/2024 1800   TCO2 23 01/14/2024 1800   ACIDBASEDEF 4.0 (H) 01/14/2024 1800   O2SAT 100 01/14/2024 1800     Coagulation Profile: Recent Labs  Lab 01/14/24 0500  INR 2.0*    Cardiac Enzymes: No results for input(s): CKTOTAL, CKMB, CKMBINDEX, TROPONINI in the last 168  hours.  HbA1C: Hgb A1c MFr Bld  Date/Time Value Ref Range Status  11/23/2023 02:59 PM 10.1 (H) 4.6 - 6.5 % Final    Comment:    Glycemic Control Guidelines for People with Diabetes:Non Diabetic:  <6%Goal of Therapy: <7%Additional Action Suggested:  >8%   07/19/2023 03:41 PM 7.0 (H) 4.6 - 6.5 % Final    Comment:    Glycemic Control Guidelines for People with Diabetes:Non Diabetic:  <6%Goal of Therapy: <7%Additional Action Suggested:  >8%     CBG: Recent Labs  Lab 01/15/24 1628 01/15/24 1922 01/15/24 2334 01/16/24 0317 01/16/24 0713  GLUCAP 157* 154* 169* 190* 189*    Review of Systems:   Patient complaining of shortness of breath and leg pain. ROS otherwise negative   Past Medical History:  She,  has a past medical history of Asthma, CAD (coronary artery disease) (11/07/2003), Cancer (HCC), Chicken pox, CVA (cerebrovascular accident) (HCC) (06/09/1999), Diabetes mellitus, Diastolic dysfunction, Empty sella syndrome (HCC), GERD (gastroesophageal reflux disease), Granulomatous disease, chronic (HCC), H/O bilateral cataract extraction, Hyperlipidemia, Hypertension, NAFLD (nonalcoholic fatty liver disease), OA (osteoarthritis), and Reactive airway disease.   Surgical History:   Past Surgical History:  Procedure Laterality Date   BILIARY BRUSHING  12/08/2023   Procedure: BRUSH BIOPSY, BILE DUCT;  Surgeon: Rosalie Kitchens, MD;  Location: [facility] ENDOSCOPY;  Service: Gastroenterology;;   BILIARY STENT PLACEMENT  12/08/2023   Procedure: INSERTION, STENT, BILE DUCT;  Surgeon: Rosalie Kitchens, MD;  Location: [facility] ENDOSCOPY;  Service: Gastroenterology;;   ORIN MEDIATE RELEASE  1992    left arm 1992   CHOLECYSTECTOMY  2003   ERCP N/A 12/08/2023   Procedure: ERCP, WITH INTERVENTION IF INDICATED;  Surgeon: Rosalie Kitchens, MD;  Location: [facility] ENDOSCOPY;  Service: Gastroenterology;  Laterality: N/A;   PORTACATH PLACEMENT Left 12/27/2023   Procedure: INSERTION, TUNNELED CENTRAL VENOUS DEVICE, WITH PORT;  Surgeon:  Dasie Leonor CROME, MD;  Location: [facility] OR;  Service: General;  Laterality:  Left;  PORT-A-CATH INSERTION   TONSILLECTOMY AND ADENOIDECTOMY       Social History:   reports that she has quit smoking. Her smoking use included cigarettes. She has a 1 pack-year smoking history. She has never used smokeless tobacco. She reports that she does not drink alcohol  and does not use drugs.   Family History:  Her family history includes Hypertension in her mother and sister; Lung cancer in her mother and sister.   Allergies Allergies  Allergen Reactions   Gabapentin  Shortness Of Breath   Telmisartan  Shortness Of Breath, Swelling and Other (See Comments)    Shortness of breath, foot swelling   Clopidogrel Bisulfate Other (See Comments)    REACTION: weak, tired, sleepy, tightness (6/05); soreness in hip (11/05)   Crestor  [Rosuvastatin  Calcium ] Other (See Comments)    Did not like how she felt on it - stomach upset, nausea   Furosemide Other (See Comments)    REACTION: did not tolerate and preferred HCTZ   Lisinopril Other (See Comments)    REACTION: cough, right sided pain   Metoprolol  Tartrate Other (See Comments)    REACTION: lightheadedness, h/a cough (7/04); heart gong to stop (7/06); sluggishnes and depressed feeling (2/07); weakness/heaviness 6/07)   Mometasone Furoate Other (See Comments)    REACTION: neck tenderness   Ozempic  (0.25 Or 0.5 Mg-Dose) [Semaglutide (0.25 Or 0.5mg -Dos)] Other (See Comments)    Blurry vision     Home Medications  Prior to Admission medications   Medication Sig Start Date End Date Taking? Authorizing Provider  albuterol  (VENTOLIN  HFA) 108 (90 Base) MCG/ACT inhaler INHALE 2 PUFFS INTO THE LUNGS EVERY 4 (FOUR) HOURS AS NEEDED FOR SHORTNESS OF BREATH. 03/12/23   Geofm Glade PARAS, MD  amLODipine  (NORVASC ) 10 MG tablet Take 1 tablet (10 mg total) by mouth daily. 03/12/23   Geofm Glade PARAS, MD  cyanocobalamin  (VITAMIN B12) 500 MCG tablet Take 500 mcg by mouth daily.    [provider]  fluticasone  (FLONASE ) 50 MCG/ACT nasal spray Place 2 sprays into both nostrils daily. 03/12/23   Geofm Glade PARAS, MD  glipiZIDE  (GLUCOTROL ) 10 MG tablet TAKE 2 TABLETS BY MOUTH 2 TIMES DAILY BEFORE A MEAL. 03/12/23   Geofm Glade PARAS, MD  hydrochlorothiazide  (HYDRODIURIL ) 25 MG tablet Take 1 tablet (25 mg total) by mouth daily. 11/23/23   Geofm Glade PARAS, MD  HYDROcodone -acetaminophen  (NORCO) 10-325 MG tablet Take 1-2 tablets by mouth every 4 (four) hours as needed for moderate pain (pain score 4-6) or severe pain (pain score 7-10). 12/13/23   Patsy Lenis, MD  ibuprofen  (ADVIL ) 800 MG tablet TAKE 1 TABLET BY MOUTH TWICE DAILY AS NEEDED FOR MODERATE PAIN 11/30/23   Geofm Glade PARAS, MD  insulin  glargine (LANTUS ) 100 UNIT/ML Solostar Pen Inject 20 Units into the skin daily. 11/30/23   Geofm Glade PARAS, MD  lidocaine -prilocaine  (EMLA ) cream Apply 1 Application topically as needed (Apply to Port 1-2 hours prior to its use start using with 2nd treatment). Patient not taking: No sig reported 12/24/23   Cloretta Arley NOVAK, MD  linaclotide  (LINZESS ) 145 MCG CAPS capsule TAKE 1 CAPSULE BY MOUTH DAILY BEFORE BREAKFAST. Patient not taking: Reported on 12/07/2023 03/12/23   Geofm Glade PARAS, MD  metFORMIN  (GLUCOPHAGE -XR) 500 MG 24 hr tablet TAKE 2 TABLETS BY MOUTH TWICE DAILY BEFORE A MEAL Patient taking differently: Take 500 mg by mouth 2 (two) times daily with a meal. TAKE 2 TABLETS BY MOUTH TWICE DAILY BEFORE A MEAL 09/23/23   Burns, Glade PARAS,  MD  montelukast  (SINGULAIR ) 10 MG tablet TAKE 1 TABLET BY MOUTH EVRY DAY AT BEDTIME 09/23/23   Geofm Glade PARAS, MD  naloxone  (NARCAN ) nasal spray 4 mg/0.1 mL 1 spray in one nostril x1; may repeat dose in alternate nostrils q2-3min prn Patient not taking: No sig reported 03/07/23   Geofm Glade PARAS, MD  ondansetron  (ZOFRAN ) 8 MG tablet Take 1 tablet (8 mg total) by mouth every 8 (eight) hours as needed. Patient not taking: No sig reported 12/24/23   Cloretta Arley NOVAK, MD  potassium  chloride (MICRO-K ) 10 MEQ CR capsule TAKE 1 CAPSULE BY MOUTH TWICE A DAY 06/07/23   Burns, Glade PARAS, MD  prochlorperazine  (COMPAZINE ) 10 MG tablet Take 1 tablet (10 mg total) by mouth every 6 (six) hours as needed for nausea or vomiting. Patient not taking: No sig reported 12/24/23   Cloretta Arley NOVAK, MD         The patient is critically ill due to septic shock from a GI source. Life threatening life arrhythmia with hypotension needing vasopressor medication titration.   Critical care was necessary to treat or prevent imminent or life-threatening deterioration. Critical care time was spent by me on the following activities: development of a treatment plan with the patient and/or surrogate as well as nursing, discussions with consultants, evaluation of the patient's response to treatment, examination of the patient, obtaining a history from the patient or surrogate, ordering and performing treatments and interventions, ordering and review of laboratory studies, ordering and review of radiographic studies, review of telemetry data including pulse oximetry, re-evaluation of patient's condition and participation in multidisciplinary rounds.   I personally spent 65 minutes providing critical care not including any separately billable procedures.   Zola LOISE Herter, MD [facility] 01/16/2024 8:14 AM

## 2024-01-16 NOTE — Progress Notes (Signed)
 Low output, hypotensive but meeting map goals. Contacted elink for further management

## 2024-01-16 NOTE — Progress Notes (Signed)
 Foley temp decreased this afternoon, patient refusing to have any blankets placed on.    Have spoken to many family members at the bedside today with updates and about our visitation policy.

## 2024-01-16 NOTE — Progress Notes (Signed)
 Subjective: Minimally interactive  Objective: Vital signs in last 24 hours: Temp:  [77.5 F (25.3 C)-98 F (36.7 C)] 95.2 F (35.1 C) (08/10 1800) Pulse Rate:  [91-148] 107 (08/10 1800) Resp:  [17-74] 28 (08/10 1800) BP: (66-135)/(45-97) 102/68 (08/10 1800) SpO2:  [80 %-100 %] 96 % (08/10 1800) FiO2 (%):  [32 %] 32 % (08/10 0810) Weight:  [107.1 kg] 107.1 kg (08/10 0337) Weight change: -3.5 kg Last BM Date : 01/14/24  PE: GEN:  Awake, tachypneic, less-responsive than yesterday ABD:  Soft, protuberant  Lab Results: CBC    Component Value Date/Time   WBC 6.4 01/16/2024 1134   RBC 3.79 (L) 01/16/2024 1134   HGB 9.2 (L) 01/16/2024 1134   HGB 9.6 (L) 01/13/2024 0915   HCT 28.7 (L) 01/16/2024 1134   PLT 34 (L) 01/16/2024 1134   PLT 547 (H) 01/13/2024 0915   MCV 75.7 (L) 01/16/2024 1134   MCH 24.3 (L) 01/16/2024 1134   MCHC 32.1 01/16/2024 1134   RDW 17.4 (H) 01/16/2024 1134   LYMPHSABS 0.2 (L) 01/16/2024 1134   MONOABS 0.2 01/16/2024 1134   EOSABS 0.0 01/16/2024 1134   BASOSABS 0.1 01/16/2024 1134  CMP     Component Value Date/Time   NA 133 (L) 01/16/2024 1134   K 3.9 01/16/2024 1134   CL 103 01/16/2024 1134   CO2 17 (L) 01/16/2024 1134   GLUCOSE 237 (H) 01/16/2024 1134   BUN 30 (H) 01/16/2024 1134   CREATININE 1.36 (H) 01/16/2024 1134   CREATININE 0.96 01/13/2024 0915   CALCIUM  7.0 (L) 01/16/2024 1134   PROT 5.7 (L) 01/16/2024 1134   ALBUMIN  1.7 (L) 01/16/2024 1134   AST 119 (H) 01/16/2024 1134   AST 46 (H) 01/13/2024 0915   ALT 128 (H) 01/16/2024 1134   ALT 66 (H) 01/13/2024 0915   ALKPHOS 377 (H) 01/16/2024 1134   BILITOT 2.5 (H) 01/16/2024 1134   BILITOT 1.0 01/13/2024 0915   GFR 60.54 12/06/2023 1219   GFRNONAA 41 (L) 01/16/2024 1134   GFRNONAA >60 01/13/2024 0915   Assessment:  Cholangitis from clogged metal biliary stent, debris removed and another stent placed through existing one via ERCP yesterday.   Pancreatic adenocarcinoma.  Plan:    Antibiotics. IVF.  Supportive care pressors as needed. We have maximized what we can do GI-wise procedurally; if having ongoing issues with hypotension and instability, might discuss with ID whether antibiotics should be broadened and might discuss with IR whether percutaneous drainage of presumed liver abscess would be feasible/helpful. [facility] will follow.  Case reviewed with Dr. Zaida.   Lindsay Hardy 01/16/2024, 6:04 PM   Cell [phone] If no answer or after 5 PM call [phone]

## 2024-01-17 ENCOUNTER — Inpatient Hospital Stay (HOSPITAL_COMMUNITY)

## 2024-01-17 ENCOUNTER — Encounter (HOSPITAL_COMMUNITY): Payer: Self-pay | Admitting: Gastroenterology

## 2024-01-17 ENCOUNTER — Ambulatory Visit: Payer: 59 | Admitting: Internal Medicine

## 2024-01-17 DIAGNOSIS — Z4682 Encounter for fitting and adjustment of non-vascular catheter: Secondary | ICD-10-CM | POA: Diagnosis not present

## 2024-01-17 DIAGNOSIS — B952 Enterococcus as the cause of diseases classified elsewhere: Secondary | ICD-10-CM

## 2024-01-17 DIAGNOSIS — I214 Non-ST elevation (NSTEMI) myocardial infarction: Secondary | ICD-10-CM

## 2024-01-17 DIAGNOSIS — D696 Thrombocytopenia, unspecified: Secondary | ICD-10-CM

## 2024-01-17 DIAGNOSIS — I10 Essential (primary) hypertension: Secondary | ICD-10-CM

## 2024-01-17 DIAGNOSIS — R0989 Other specified symptoms and signs involving the circulatory and respiratory systems: Secondary | ICD-10-CM | POA: Diagnosis not present

## 2024-01-17 DIAGNOSIS — R918 Other nonspecific abnormal finding of lung field: Secondary | ICD-10-CM | POA: Diagnosis not present

## 2024-01-17 DIAGNOSIS — J984 Other disorders of lung: Secondary | ICD-10-CM | POA: Diagnosis not present

## 2024-01-17 DIAGNOSIS — B9620 Unspecified Escherichia coli [E. coli] as the cause of diseases classified elsewhere: Secondary | ICD-10-CM

## 2024-01-17 DIAGNOSIS — A419 Sepsis, unspecified organism: Secondary | ICD-10-CM | POA: Diagnosis not present

## 2024-01-17 DIAGNOSIS — J9601 Acute respiratory failure with hypoxia: Secondary | ICD-10-CM

## 2024-01-17 DIAGNOSIS — R7881 Bacteremia: Secondary | ICD-10-CM

## 2024-01-17 DIAGNOSIS — K8309 Other cholangitis: Secondary | ICD-10-CM

## 2024-01-17 DIAGNOSIS — I4891 Unspecified atrial fibrillation: Secondary | ICD-10-CM | POA: Diagnosis not present

## 2024-01-17 LAB — CBC
HCT: 24.5 % — ABNORMAL LOW (ref 36.0–46.0)
HCT: 23.6 % — ABNORMAL LOW (ref 36.0–46.0)
Hemoglobin: 7.8 g/dL — ABNORMAL LOW (ref 12.0–15.0)
Hemoglobin: 7.1 g/dL — ABNORMAL LOW (ref 12.0–15.0)
MCH: 24.4 pg — ABNORMAL LOW (ref 26.0–34.0)
MCH: 23.9 pg — ABNORMAL LOW (ref 26.0–34.0)
MCHC: 31.8 g/dL (ref 30.0–36.0)
MCHC: 30.1 g/dL (ref 30.0–36.0)
MCV: 76.6 fL — ABNORMAL LOW (ref 80.0–100.0)
MCV: 79.5 fL — ABNORMAL LOW (ref 80.0–100.0)
Platelets: 12 K/uL — CL (ref 150–400)
Platelets: 7 K/uL — CL (ref 150–400)
RBC: 3.2 MIL/uL — ABNORMAL LOW (ref 3.87–5.11)
RBC: 2.97 MIL/uL — ABNORMAL LOW (ref 3.87–5.11)
RDW: 17.6 % — ABNORMAL HIGH (ref 11.5–15.5)
RDW: 18.1 % — ABNORMAL HIGH (ref 11.5–15.5)
WBC: 1.8 K/uL — ABNORMAL LOW (ref 4.0–10.5)
WBC: 2.3 K/uL — ABNORMAL LOW (ref 4.0–10.5)
nRBC: 0 % (ref 0.0–0.2)
nRBC: 0 % (ref 0.0–0.2)

## 2024-01-17 LAB — CBC WITH DIFFERENTIAL/PLATELET
Abs Immature Granulocytes: 0.17 K/uL — ABNORMAL HIGH (ref 0.00–0.07)
Basophils Absolute: 0.1 K/uL (ref 0.0–0.1)
Basophils Relative: 2 %
Eosinophils Absolute: 0 K/uL (ref 0.0–0.5)
Eosinophils Relative: 0 %
HCT: 25.4 % — ABNORMAL LOW (ref 36.0–46.0)
Hemoglobin: 8.3 g/dL — ABNORMAL LOW (ref 12.0–15.0)
Immature Granulocytes: 6 %
Lymphocytes Relative: 3 %
Lymphs Abs: 0.1 K/uL — ABNORMAL LOW (ref 0.7–4.0)
MCH: 24.6 pg — ABNORMAL LOW (ref 26.0–34.0)
MCHC: 32.7 g/dL (ref 30.0–36.0)
MCV: 75.1 fL — ABNORMAL LOW (ref 80.0–100.0)
Monocytes Absolute: 0.2 K/uL (ref 0.1–1.0)
Monocytes Relative: 5 %
Neutro Abs: 2.5 K/uL (ref 1.7–7.7)
Neutrophils Relative %: 84 %
Platelets: 19 K/uL — CL (ref 150–400)
RBC: 3.38 MIL/uL — ABNORMAL LOW (ref 3.87–5.11)
RDW: 17.4 % — ABNORMAL HIGH (ref 11.5–15.5)
WBC: 3 K/uL — ABNORMAL LOW (ref 4.0–10.5)
nRBC: 0 % (ref 0.0–0.2)

## 2024-01-17 LAB — RENAL FUNCTION PANEL
Albumin: <1.5 g/dL — ABNORMAL LOW (ref 3.5–5.0)
Anion gap: 20 — ABNORMAL HIGH (ref 5–15)
BUN: 38 mg/dL — ABNORMAL HIGH (ref 8–23)
CO2: 14 mmol/L — ABNORMAL LOW (ref 22–32)
Calcium: 6.8 mg/dL — ABNORMAL LOW (ref 8.9–10.3)
Chloride: 101 mmol/L (ref 98–111)
Creatinine, Ser: 1.87 mg/dL — ABNORMAL HIGH (ref 0.44–1.00)
GFR, Estimated: 28 mL/min — ABNORMAL LOW (ref >60)
Glucose, Bld: 125 mg/dL — ABNORMAL HIGH (ref 70–99)
Phosphorus: 4.5 mg/dL (ref 2.5–4.6)
Potassium: 4.9 mmol/L (ref 3.5–5.1)
Sodium: 135 mmol/L (ref 135–145)

## 2024-01-17 LAB — COMPREHENSIVE METABOLIC PANEL WITH GFR
ALT: 132 U/L — ABNORMAL HIGH (ref 0–44)
AST: 152 U/L — ABNORMAL HIGH (ref 15–41)
Albumin: 1.6 g/dL — ABNORMAL LOW (ref 3.5–5.0)
Alkaline Phosphatase: 344 U/L — ABNORMAL HIGH (ref 38–126)
Anion gap: 14 (ref 5–15)
BUN: 32 mg/dL — ABNORMAL HIGH (ref 8–23)
CO2: 20 mmol/L — ABNORMAL LOW (ref 22–32)
Calcium: 7 mg/dL — ABNORMAL LOW (ref 8.9–10.3)
Chloride: 99 mmol/L (ref 98–111)
Creatinine, Ser: 1.54 mg/dL — ABNORMAL HIGH (ref 0.44–1.00)
GFR, Estimated: 35 mL/min — ABNORMAL LOW (ref >60)
Glucose, Bld: 243 mg/dL — ABNORMAL HIGH (ref 70–99)
Potassium: 3.7 mmol/L (ref 3.5–5.1)
Sodium: 133 mmol/L — ABNORMAL LOW (ref 135–145)
Total Bilirubin: 2.5 mg/dL — ABNORMAL HIGH (ref 0.0–1.2)
Total Protein: 5.7 g/dL — ABNORMAL LOW (ref 6.5–8.1)

## 2024-01-17 LAB — POCT I-STAT 7, (LYTES, BLD GAS, ICA,H+H)
Acid-base deficit: 10 mmol/L — ABNORMAL HIGH (ref 0.0–2.0)
Bicarbonate: 16.1 mmol/L — ABNORMAL LOW (ref 20.0–28.0)
Calcium, Ion: 1.07 mmol/L — ABNORMAL LOW (ref 1.15–1.40)
HCT: 23 % — ABNORMAL LOW (ref 36.0–46.0)
Hemoglobin: 7.8 g/dL — ABNORMAL LOW (ref 12.0–15.0)
O2 Saturation: 100 %
Potassium: 4.6 mmol/L (ref 3.5–5.1)
Sodium: 133 mmol/L — ABNORMAL LOW (ref 135–145)
TCO2: 17 mmol/L — ABNORMAL LOW (ref 22–32)
pCO2 arterial: 35 mmHg (ref 32–48)
pH, Arterial: 7.271 — ABNORMAL LOW (ref 7.35–7.45)
pO2, Arterial: 326 mmHg — ABNORMAL HIGH (ref 83–108)

## 2024-01-17 LAB — DIC (DISSEMINATED INTRAVASCULAR COAGULATION)PANEL
D-Dimer, Quant: 2.52 ug{FEU}/mL — ABNORMAL HIGH (ref 0.00–0.50)
Fibrinogen: 635 mg/dL — ABNORMAL HIGH (ref 210–475)
INR: 1.7 — ABNORMAL HIGH (ref 0.8–1.2)
Platelets: 12 K/uL — CL (ref 150–400)
Prothrombin Time: 20.5 s — ABNORMAL HIGH (ref 11.4–15.2)
Smear Review: NONE SEEN
aPTT: 45 s — ABNORMAL HIGH (ref 24–36)

## 2024-01-17 LAB — BASIC METABOLIC PANEL WITH GFR
Anion gap: 14 (ref 5–15)
BUN: 37 mg/dL — ABNORMAL HIGH (ref 8–23)
CO2: 18 mmol/L — ABNORMAL LOW (ref 22–32)
Calcium: 6.8 mg/dL — ABNORMAL LOW (ref 8.9–10.3)
Chloride: 99 mmol/L (ref 98–111)
Creatinine, Ser: 1.61 mg/dL — ABNORMAL HIGH (ref 0.44–1.00)
GFR, Estimated: 33 mL/min — ABNORMAL LOW (ref >60)
Glucose, Bld: 186 mg/dL — ABNORMAL HIGH (ref 70–99)
Potassium: 4.1 mmol/L (ref 3.5–5.1)
Sodium: 131 mmol/L — ABNORMAL LOW (ref 135–145)

## 2024-01-17 LAB — LACTIC ACID, PLASMA
Lactic Acid, Venous: 8.8 mmol/L (ref 0.5–1.9)
Lactic Acid, Venous: 7.9 mmol/L (ref 0.5–1.9)
Lactic Acid, Venous: 8.6 mmol/L (ref 0.5–1.9)

## 2024-01-17 LAB — GLUCOSE, CAPILLARY
Glucose-Capillary: 119 mg/dL — ABNORMAL HIGH (ref 70–99)
Glucose-Capillary: 123 mg/dL — ABNORMAL HIGH (ref 70–99)
Glucose-Capillary: 164 mg/dL — ABNORMAL HIGH (ref 70–99)
Glucose-Capillary: 199 mg/dL — ABNORMAL HIGH (ref 70–99)
Glucose-Capillary: 226 mg/dL — ABNORMAL HIGH (ref 70–99)
Glucose-Capillary: 47 mg/dL — ABNORMAL LOW (ref 70–99)
Glucose-Capillary: 51 mg/dL — ABNORMAL LOW (ref 70–99)
Glucose-Capillary: 93 mg/dL (ref 70–99)

## 2024-01-17 MED ORDER — DOCUSATE SODIUM 50 MG/5ML PO LIQD
100.0000 mg | Freq: Two times a day (BID) | ORAL | Status: DC
  Administered 2024-01-17 (×2): 100 mg
  Filled 2024-01-17: qty 10

## 2024-01-17 MED ORDER — CALCIUM CHLORIDE 10 % IV SOLN
INTRAVENOUS | Status: AC
  Filled 2024-01-17: qty 10

## 2024-01-17 MED ORDER — CALCIUM GLUCONATE-NACL 1-0.675 GM/50ML-% IV SOLN
1.0000 g | Freq: Once | INTRAVENOUS | Status: AC
  Administered 2024-01-17 (×2): 1000 mg via INTRAVENOUS
  Filled 2024-01-17: qty 50

## 2024-01-17 MED ORDER — FENTANYL CITRATE PF 50 MCG/ML IJ SOSY
25.0000 ug | PREFILLED_SYRINGE | INTRAMUSCULAR | Status: DC | PRN
  Administered 2024-01-17 (×4): 50 ug via INTRAVENOUS
  Filled 2024-01-17: qty 1

## 2024-01-17 MED ORDER — FENTANYL CITRATE PF 50 MCG/ML IJ SOSY: 25.0000 ug | PREFILLED_SYRINGE | INTRAMUSCULAR | Status: DC | PRN

## 2024-01-17 MED ORDER — SODIUM CHLORIDE 0.9 % IV SOLN
3.0000 g | Freq: Four times a day (QID) | INTRAVENOUS | Status: DC
  Administered 2024-01-17 (×2): 3 g via INTRAVENOUS
  Filled 2024-01-17: qty 8

## 2024-01-17 MED ORDER — EPINEPHRINE 1 MG/10ML IJ SOSY
PREFILLED_SYRINGE | INTRAMUSCULAR | Status: AC
  Filled 2024-01-17: qty 30

## 2024-01-17 MED ORDER — VANCOMYCIN HCL 1250 MG/250ML IV SOLN: 1250.0000 mg | INTRAVENOUS | Status: DC

## 2024-01-17 MED ORDER — ROCURONIUM BROMIDE 10 MG/ML (PF) SYRINGE
PREFILLED_SYRINGE | INTRAVENOUS | Status: AC
  Filled 2024-01-17: qty 10

## 2024-01-17 MED ORDER — PANTOPRAZOLE SODIUM 40 MG IV SOLR
40.0000 mg | INTRAVENOUS | Status: DC
  Administered 2024-01-17 (×2): 40 mg via INTRAVENOUS
  Filled 2024-01-17: qty 10

## 2024-01-17 MED ORDER — DEXTROSE 50 % IV SOLN
INTRAVENOUS | Status: AC
Start: 2024-01-17 — End: 2024-01-17
  Filled 2024-01-17: qty 50

## 2024-01-17 MED ORDER — SODIUM CHLORIDE 0.9% IV SOLUTION: Freq: Once | INTRAVENOUS | Status: DC

## 2024-01-17 MED ORDER — IPRATROPIUM-ALBUTEROL 0.5-2.5 (3) MG/3ML IN SOLN
3.0000 mL | Freq: Four times a day (QID) | RESPIRATORY_TRACT | Status: DC
  Administered 2024-01-17 (×2): 3 mL via RESPIRATORY_TRACT
  Filled 2024-01-17 (×2): qty 3

## 2024-01-17 MED ORDER — ATROPINE SULFATE 1 MG/10ML IJ SOSY
PREFILLED_SYRINGE | INTRAMUSCULAR | Status: AC
Start: 2024-01-17 — End: 2024-01-17
  Filled 2024-01-17: qty 10

## 2024-01-17 MED ORDER — HYDROCORTISONE SOD SUC (PF) 100 MG IJ SOLR
100.0000 mg | Freq: Two times a day (BID) | INTRAMUSCULAR | Status: DC
  Administered 2024-01-17 (×2): 100 mg via INTRAVENOUS
  Filled 2024-01-17: qty 2

## 2024-01-17 MED ORDER — STERILE WATER FOR INJECTION IJ SOLN
INTRAMUSCULAR | Status: AC
  Filled 2024-01-17: qty 10

## 2024-01-17 MED ORDER — POTASSIUM CHLORIDE 10 MEQ/100ML IV SOLN
10.0000 meq | INTRAVENOUS | Status: AC
  Administered 2024-01-17 (×8): 10 meq via INTRAVENOUS
  Filled 2024-01-17 (×4): qty 100

## 2024-01-17 MED ORDER — BUMETANIDE 0.25 MG/ML IJ SOLN
1.0000 mg | Freq: Once | INTRAMUSCULAR | Status: AC
  Administered 2024-01-17 (×2): 1 mg via INTRAVENOUS
  Filled 2024-01-17: qty 4

## 2024-01-17 MED ORDER — VANCOMYCIN HCL 2000 MG/400ML IV SOLN
2000.0000 mg | Freq: Once | INTRAVENOUS | Status: AC
  Administered 2024-01-17 (×2): 2000 mg via INTRAVENOUS
  Filled 2024-01-17: qty 400

## 2024-01-17 MED ORDER — SODIUM CHLORIDE 0.9 % IV SOLN
100.0000 mg | INTRAVENOUS | Status: DC
  Administered 2024-01-17 (×2): 100 mg via INTRAVENOUS
  Filled 2024-01-17 (×2): qty 5

## 2024-01-17 MED ORDER — VASOPRESSIN 20 UNITS/100 ML INFUSION FOR SHOCK
0.0000 [IU]/min | INTRAVENOUS | Status: DC
  Administered 2024-01-17 (×2): 0.03 [IU]/min via INTRAVENOUS
  Filled 2024-01-17 (×2): qty 100

## 2024-01-17 MED ORDER — ETOMIDATE 2 MG/ML IV SOLN
INTRAVENOUS | Status: AC
  Filled 2024-01-17: qty 20

## 2024-01-17 MED ORDER — SODIUM BICARBONATE 8.4 % IV SOLN
INTRAVENOUS | Status: AC
  Filled 2024-01-17: qty 50

## 2024-01-17 MED ORDER — PIPERACILLIN-TAZOBACTAM 3.375 G IVPB
3.3750 g | Freq: Three times a day (TID) | INTRAVENOUS | Status: DC
  Administered 2024-01-17 (×2): 3.375 g via INTRAVENOUS
  Filled 2024-01-17: qty 50

## 2024-01-17 MED ORDER — POLYETHYLENE GLYCOL 3350 17 G PO PACK
17.0000 g | PACK | Freq: Every day | ORAL | Status: DC
  Filled 2024-01-17: qty 1

## 2024-01-17 MED ORDER — DEXTROSE 50 % IV SOLN
50.0000 mL | Freq: Once | INTRAVENOUS | Status: AC
  Administered 2024-01-17 (×2): 50 mL via INTRAVENOUS

## 2024-01-17 MED ORDER — SODIUM BICARBONATE 8.4 % IV SOLN
INTRAVENOUS | Status: AC
  Filled 2024-01-17: qty 100

## 2024-01-17 NOTE — Progress Notes (Signed)
 Critical platelets 19 called to [facility]

## 2024-01-17 NOTE — Progress Notes (Signed)
 [facility]  Date of Admission:  01/13/2024      Total days of antibiotics 5        24hr: still appears lethargic,not following commands, increased hypothermia, requiring heated blanket, and labs showing worsening of leukopenia/neutropenia/thrombocytopenia (down to 12) ASSESSMENT: Lindsay Hardy is a 73 y.o. female admitted with   Polymicrobial Bacteremia -  E Coli, Enterococcus Faecalis - in setting of cholangitis, recent chemo with portacath in place -  Only one set of cultures were obtained before antibiotics initiated. Likely GI source/translocation with tumor - CT scan also with concern over colitis findings, currently on amp/sub for enterococcal and e.coli bacteremia  Imaging of liver unclear if has abscess vs. Infarct from treatment response  - fu TTE --> unlikely to tolerate TEE, would treat for 2 weeks then repeat blood cultures to see if need to remove port    Pancreatic Cancer stage Ib (cT2c N0) -  She is currently undergoing neoadjuvant chemotherapy. She completed a cycle of gemcitabine /Abraxane  yesterday and is under the care of Dr. Cloretta.    Port a Cath Placement -  Placed with gen surgery on 12/27/23.  Surgery team recommended more evidence that the port is infected vs empiric removal. Will need to follow with her closely after end of therapy.  More acutely, if sepsis physiology does not resolve, would circle back to consider removal.    Diabetes -  Uncontrolled with A1c 10.1   PLAN: Continue with amp/sub to treat bacteremia Titrate pressors as needed Aki = will have pharmacy adjust dose of amp/sub should her kidney function continue to worsen Have IR evaluate imaging to see if needs aspiration of liver lesion/abscess to help with source control Repeat cbc with diff, may meet plt transfusion requirement Overall condition quite guarded   evaluation of this patient requires complex antimicrobial therapy evaluation and  counseling and isolation needs for disease transmission risk assessment and mitigation.    Principal Problem:   Bacteremia Active Problems:   Septic shock (HCC)   Shock (HCC)   Polymicrobial bacterial infection   Enterococcus faecalis infection   Sepsis due to Escherichia coli (HCC)   Malignant neoplasm of pancreas (HCC)   Sepsis (HCC)    arformoterol   15 mcg Nebulization BID   budesonide  (PULMICORT ) nebulizer solution  0.25 mg Nebulization BID   Chlorhexidine  Gluconate Cloth  6 each Topical Nightly   insulin  aspart  0-20 Units Subcutaneous Q4H   insulin  glargine-yfgn  15 Units Subcutaneous QHS   ipratropium-albuterol   3 mL Nebulization Q6H   lidocaine   1 patch Transdermal Q24H   methocarbamol  (ROBAXIN ) injection  500 mg Intravenous Q8H   pantoprazole  (PROTONIX ) IV  40 mg Intravenous Q24H   sodium chloride  flush  10-40 mL Intracatheter Q12H    SUBJECTIVE: Ill appearing today in ICU. Sleepy/lethargic per her nurse today compared to weekend.    Review of Systems: ROS  Allergies  Allergen Reactions   Gabapentin  Shortness Of Breath   Telmisartan  Shortness Of Breath, Swelling and Other (See Comments)    Shortness of breath, foot swelling   Clopidogrel Bisulfate Other (See Comments)    REACTION: weak, tired, sleepy, tightness (6/05); soreness in hip (11/05)   Crestor  [Rosuvastatin  Calcium ] Other (See Comments)    Did not like how she felt on it - stomach upset, nausea   Furosemide Other (See Comments)    REACTION: did not tolerate and preferred HCTZ   Lisinopril Other (See Comments)  REACTION: cough, right sided pain   Metoprolol  Tartrate Other (See Comments)    REACTION: lightheadedness, h/a cough (7/04); heart gong to stop (7/06); sluggishnes and depressed feeling (2/07); weakness/heaviness 6/07)   Mometasone Furoate Other (See Comments)    REACTION: neck tenderness   Ozempic  (0.25 Or 0.5 Mg-Dose) [Semaglutide (0.25 Or 0.5mg -Dos)] Other (See Comments)    Blurry vision     OBJECTIVE: Vitals:   01/17/24 1045 01/17/24 1100 01/17/24 1115 01/17/24 1130  BP: (!) 115/56 (!) 110/58 (!) 118/59 (!) 134/51  Pulse: 71 74 75 74  Resp: (!) 26 (!) 25 (!) 24 (!) 23  Temp:      TempSrc:      SpO2: 100% 100% 99% 100%  Weight:      Height:       Body mass index is 39.77 kg/m.  Physical Exam  Lab Results Lab Results  Component Value Date   WBC 1.8 (L) 01/17/2024   HGB 7.8 (L) 01/17/2024   HCT 24.5 (L) 01/17/2024   MCV 76.6 (L) 01/17/2024   PLT 12 (LL) 01/17/2024   PLT 12 (LL) 01/17/2024    Lab Results  Component Value Date   CREATININE 1.54 (H) 01/17/2024   BUN 32 (H) 01/17/2024   NA 133 (L) 01/17/2024   K 3.7 01/17/2024   CL 99 01/17/2024   CO2 20 (L) 01/17/2024    Lab Results  Component Value Date   ALT 132 (H) 01/17/2024   AST 152 (H) 01/17/2024   ALKPHOS 344 (H) 01/17/2024   BILITOT 2.5 (H) 01/17/2024     Microbiology: Recent Results (from the past 240 hours)  Blood culture (routine x 2)     Status: Abnormal (Preliminary result)   Collection Time: 01/13/24  4:02 PM   Specimen: BLOOD LEFT FOREARM  Result Value Ref Range Status   Specimen Description   Final    BLOOD LEFT FOREARM Performed at [facility], [address], [place], [place] [address]    Special Requests   Final    Blood Culture adequate volume BOTTLES DRAWN AEROBIC AND ANAEROBIC Performed at [facility], [address] [place], [place] [address]    Culture  Setup Time   Final    GRAM NEGATIVE RODS GRAM POSITIVE COCCI IN BOTH AEROBIC AND ANAEROBIC BOTTLES CRITICAL RESULT CALLED TO, READ BACK BY AND VERIFIED WITH: PHARMD G ABBOTT 01/14/2024 0650 BY AB    Culture (A)  Final    ESCHERICHIA COLI GRAM POSITIVE COCCI CULTURE REINCUBATED FOR BETTER GROWTH Performed at [facility], [address] [place], [place] [address]    Report Status PENDING  Incomplete   Organism ID, Bacteria ESCHERICHIA COLI  Final   Organism ID, Bacteria  ESCHERICHIA COLI  Final      Susceptibility   Escherichia coli - KIRBY BAUER*    CEFAZOLIN  SENSITIVE Sensitive    Escherichia coli - MIC*    AMPICILLIN  8 SENSITIVE Sensitive     CEFEPIME  <=0.12 SENSITIVE Sensitive     CEFTAZIDIME <=1 SENSITIVE Sensitive     CEFTRIAXONE <=0.25 SENSITIVE Sensitive     CIPROFLOXACIN  <=0.25 SENSITIVE Sensitive     GENTAMICIN <=1 SENSITIVE Sensitive     IMIPENEM <=0.25 SENSITIVE Sensitive     TRIMETH/SULFA <=20 SENSITIVE Sensitive     AMPICILLIN /SULBACTAM 4 SENSITIVE Sensitive     PIP/TAZO <=4 SENSITIVE Sensitive ug/mL    * ESCHERICHIA COLI    ESCHERICHIA COLI  Blood Culture ID Panel (Reflexed)  Status: Abnormal   Collection Time: 01/13/24  4:02 PM  Result Value Ref Range Status   Enterococcus faecalis DETECTED (A) NOT DETECTED Final    Comment: CRITICAL RESULT CALLED TO, READ BACK BY AND VERIFIED WITH: PHARMD G ABBOTT 01/14/2024 0650 BY AB    Enterococcus Faecium NOT DETECTED NOT DETECTED Final   Listeria monocytogenes NOT DETECTED NOT DETECTED Final   Staphylococcus species NOT DETECTED NOT DETECTED Final   Staphylococcus aureus (BCID) NOT DETECTED NOT DETECTED Final   Staphylococcus epidermidis NOT DETECTED NOT DETECTED Final   Staphylococcus lugdunensis NOT DETECTED NOT DETECTED Final   Streptococcus species NOT DETECTED NOT DETECTED Final   Streptococcus agalactiae NOT DETECTED NOT DETECTED Final   Streptococcus pneumoniae NOT DETECTED NOT DETECTED Final   Streptococcus pyogenes NOT DETECTED NOT DETECTED Final   A.calcoaceticus-baumannii NOT DETECTED NOT DETECTED Final   Bacteroides fragilis NOT DETECTED NOT DETECTED Final   Enterobacterales DETECTED (A) NOT DETECTED Final    Comment: Enterobacterales represent a large order of gram negative bacteria, not a single organism. CRITICAL RESULT CALLED TO, READ BACK BY AND VERIFIED WITH: PHARMD G ABBOTT 01/14/2024 0650 BY AB    Enterobacter cloacae complex NOT DETECTED NOT DETECTED Final    Escherichia coli DETECTED (A) NOT DETECTED Final    Comment: CRITICAL RESULT CALLED TO, READ BACK BY AND VERIFIED WITH: PHARMD G ABBOTT 01/14/2024 0650 BY AB    Klebsiella aerogenes NOT DETECTED NOT DETECTED Final   Klebsiella oxytoca NOT DETECTED NOT DETECTED Final   Klebsiella pneumoniae NOT DETECTED NOT DETECTED Final   Proteus species NOT DETECTED NOT DETECTED Final   Salmonella species NOT DETECTED NOT DETECTED Final   Serratia marcescens NOT DETECTED NOT DETECTED Final   Haemophilus influenzae NOT DETECTED NOT DETECTED Final   Neisseria meningitidis NOT DETECTED NOT DETECTED Final   Pseudomonas aeruginosa NOT DETECTED NOT DETECTED Final   Stenotrophomonas maltophilia NOT DETECTED NOT DETECTED Final   Candida albicans NOT DETECTED NOT DETECTED Final   Candida auris NOT DETECTED NOT DETECTED Final   Candida glabrata NOT DETECTED NOT DETECTED Final   Candida krusei NOT DETECTED NOT DETECTED Final   Candida parapsilosis NOT DETECTED NOT DETECTED Final   Candida tropicalis NOT DETECTED NOT DETECTED Final   Cryptococcus neoformans/gattii NOT DETECTED NOT DETECTED Final   CTX-M ESBL NOT DETECTED NOT DETECTED Final   Carbapenem resistance IMP NOT DETECTED NOT DETECTED Final   Carbapenem resistance KPC NOT DETECTED NOT DETECTED Final   Carbapenem resistance NDM NOT DETECTED NOT DETECTED Final   Carbapenem resist OXA 48 LIKE NOT DETECTED NOT DETECTED Final   Vancomycin  resistance NOT DETECTED NOT DETECTED Final   Carbapenem resistance VIM NOT DETECTED NOT DETECTED Final    Comment: Performed at [facility], [address] [place], [place] [address]  Resp panel by RT-PCR (RSV, Flu A&B, Covid) Anterior Nasal Swab     Status: None   Collection Time: 01/13/24  5:28 PM   Specimen: Anterior Nasal Swab  Result Value Ref Range Status   SARS Coronavirus 2 by RT PCR NEGATIVE NEGATIVE Final    Comment: (NOTE) SARS-CoV-2 target nucleic acids are NOT DETECTED.  The SARS-CoV-2 RNA is  generally detectable in upper respiratory specimens during the acute phase of infection. The lowest concentration of SARS-CoV-2 viral copies this assay can detect is 138 copies/mL. A negative result does not preclude SARS-Cov-2 infection and should not be used as the sole basis for treatment or other patient management decisions. A negative result  may occur with  improper specimen collection/handling, submission of specimen other than nasopharyngeal swab, presence of viral mutation(s) within the areas targeted by this assay, and inadequate number of viral copies(<138 copies/mL). A negative result must be combined with clinical observations, patient history, and epidemiological information. The expected result is Negative.  Fact Sheet for Patients:  BloggerCourse.com  Fact Sheet for Healthcare Providers:  SeriousBroker.it  This test is no t yet approved or cleared by the [place]  FDA and  has been authorized for detection and/or diagnosis of SARS-CoV-2 by FDA under an Emergency Use Authorization (EUA). This EUA will remain  in effect (meaning this test can be used) for the duration of the COVID-19 declaration under Section 564(b)(1) of the Act, 21 U.S.C.section 360bbb-3(b)(1), unless the authorization is terminated  or revoked sooner.       Influenza A by PCR NEGATIVE NEGATIVE Final   Influenza B by PCR NEGATIVE NEGATIVE Final    Comment: (NOTE) The Xpert Xpress SARS-CoV-2/FLU/RSV plus assay is intended as an aid in the diagnosis of influenza from Nasopharyngeal swab specimens and should not be used as a sole basis for treatment. Nasal washings and aspirates are unacceptable for Xpert Xpress SARS-CoV-2/FLU/RSV testing.  Fact Sheet for Patients: BloggerCourse.com  Fact Sheet for Healthcare Providers: SeriousBroker.it  This test is not yet approved or cleared by the [place] FDA and has been authorized for detection and/or diagnosis of SARS-CoV-2 by FDA under an Emergency Use Authorization (EUA). This EUA will remain in effect (meaning this test can be used) for the duration of the COVID-19 declaration under Section 564(b)(1) of the Act, 21 U.S.C. section 360bbb-3(b)(1), unless the authorization is terminated or revoked.     Resp Syncytial Virus by PCR NEGATIVE NEGATIVE Final    Comment: (NOTE) Fact Sheet for Patients: BloggerCourse.com  Fact Sheet for Healthcare Providers: SeriousBroker.it  This test is not yet approved or cleared by the [place]  FDA and has been authorized for detection and/or diagnosis of SARS-CoV-2 by FDA under an Emergency Use Authorization (EUA). This EUA will remain in effect (meaning this test can be used) for the duration of the COVID-19 declaration under Section 564(b)(1) of the Act, 21 U.S.C. section 360bbb-3(b)(1), unless the authorization is terminated or revoked.  Performed at [facility], [address] [place], [place] [address]   MRSA Next Gen by PCR, Nasal     Status: None   Collection Time: 01/13/24  9:32 PM   Specimen: Nasal Mucosa; Nasal Swab  Result Value Ref Range Status   MRSA by PCR Next Gen NOT DETECTED NOT DETECTED Final    Comment: (NOTE) The GeneXpert MRSA Assay (FDA approved for NASAL specimens only), is one component of a comprehensive MRSA colonization surveillance program. It is not intended to diagnose MRSA infection nor to guide or monitor treatment for MRSA infections. Test performance is not FDA approved in patients less than 54 years old. Performed at [facility], [address] [place], [place] [address]   Culture, blood (Routine X 2) w Reflex to ID Panel     Status: None (Preliminary result)   Collection Time: 01/15/24  9:25 AM   Specimen: BLOOD LEFT HAND  Result Value Ref Range Status   Specimen  Description BLOOD LEFT HAND  Final   Special Requests   Final    BOTTLES DRAWN AEROBIC ONLY Blood Culture adequate volume   Culture   Final    NO GROWTH 2 DAYS Performed at [facility], [address] [place], [place] [address]    Report Status PENDING  Incomplete  Culture, blood (Routine X 2) w Reflex to ID Panel     Status: None (Preliminary result)   Collection Time: 01/15/24  9:26 AM   Specimen: BLOOD LEFT HAND  Result Value Ref Range Status   Specimen Description BLOOD LEFT HAND  Final   Special Requests   Final    BOTTLES DRAWN AEROBIC ONLY Blood Culture adequate volume   Culture   Final    NO GROWTH 2 DAYS Performed at [facility], [address], [place], [place] [address]    Report Status PENDING  Incomplete    Montie NOVAK. Luiz MD MPH [facility] [phone]

## 2024-01-17 NOTE — Progress Notes (Signed)
 NAME:  Lindsay Hardy, MRN:  [id number], DOB:  Dec 14, 1950, LOS: 4 ADMISSION DATE:  01/13/2024, CONSULTATION DATE:  01/14/24 REFERRING MD:  EDP , CHIEF COMPLAINT:     History of Present Illness:  73 year old female with past medical history of CVA, diabetes, hypertension, hyperlipidemia, diverticulosis, obesity who was recently diagnosed with pancreatic cancer status post 2 cycles of gemcitabine /Abraxane  (cycle 2 on 01/13/2024).  Presented to the ER after second cycle infusion with tachycardia.  Family at the time stated that she was feeling cold.  In the ER she was noted to have a heart rate of 150.  Laboratory workup at that time showed mild leukocytosis 10.9, mild LFT elevation (alk phos elevated at 693).  And a lactic acid of 6.7.  CT head, chest, abdomen and pelvis performed which showed evidence of colitis with a hypoenhancing area around the liver.   In the ER the patient became more hypotensive despite aggressive resuscitation with 4 L of IV fluids.  She was started on Levophed  and admitted to the ICU.  She was started on broad-spectrum antibiotics with Vanco and Zosyn .  Overnight her blood cultures grew E. coli and E faecalis, these organisms likely represent a GI source.  GI already consulted. Underwent ERCP and stent placed to drain biliary obstruction.    Pertinent  Medical History   Past Medical History:  Diagnosis Date   Asthma    CAD (coronary artery disease) 11/07/2003    RCA 60% stenosis, LAD diffuse   Cancer (HCC)    Pancreatic   Chicken pox    CVA (cerebrovascular accident) (HCC) 06/09/1999    right frontoparietal cortical CVA - MRI September 2001   Diabetes mellitus    Diastolic dysfunction     ejection fraction 55-65% on 2-D echo May 2006   Empty sella syndrome [facility])     based on MRI September 2001, related to obesity, hypertension, it was determined that patient has not required treatment but will be monitored with TSH, ACTH, cortisol, testosterone, prolactin, growth  hormone   GERD (gastroesophageal reflux disease)     EGD June 2004 -  followed by Dr. Avram   Granulomatous disease, chronic (HCC)     in right upper lobe per x-ray December 2005   H/O bilateral cataract extraction    Hyperlipidemia    Hypertension    NAFLD (nonalcoholic fatty liver disease)     based on ultrasound June 2002, elevated transaminase   OA (osteoarthritis)     DJP coracoclavicular ligament, left patella osteophytes June 2002, L4-S1 spondylosis, degenerative disc disease with bulge. I in October 2001, the C2-C4 spondylosis without stenosis and with spurs based on x-ray July 2005; diffuse idiopathic skeletal hyperostosis with bilateral hip lumbar degenerative changes   on x-ray February 2005, bilateral plantar calcaneal spurs   Reactive airway disease     peak flow 180 in May 2005, chronic sinusitis with PND, bronchitis December 2002 and strep pneumonia April 2007,  FEC 66, MCV 60, ratio is 69, DLCO 57,  moderate restrictive and mild obstructive disease  on spirometry May 2005     Significant Hospital Events: Including procedures, antibiotic start and stop dates in addition to other pertinent events   Blood cultures growing E fecalis and E coli Going for ERCP 8/8 8/9 extubated    Interim History / Subjective:  Patient laying in bed , in no acute distress.   Objective    Blood pressure (!) 90/58, pulse 96, temperature 97.6 F (36.4 C), temperature source Oral, resp.  rate (!) 25, height 5' 5 (1.651 m), weight 108.4 kg, SpO2 100%.    FiO2 (%):  [32 %] 32 %   Intake/Output Summary (Last 24 hours) at 01/17/2024 0703 Last data filed at 01/17/2024 0600 Gross per 24 hour  Intake 2731.39 ml  Output 320 ml  Net 2411.39 ml   Filed Weights   01/15/24 0500 01/16/24 0337 01/17/24 0500  Weight: 110.6 kg 107.1 kg 108.4 kg   Examination: General: Ill appearing female in moderate distress  Lungs:  tachypnea, end expiratory wheeze  Cardiovascular: RRR , NL s1,s2, Abdomen:  Soft, NT, ND  Extremities: Warm Neuro: Intact    Resolved problem list   Assessment and Plan   Neuro #History of stroke no issuess  - Should be on ASA or plavix at home. Defer to PCP   Cardiovascular #CAD # Type II NSTEMI (flat trops) #Afib RVR 8/10 #Hyperlipidemia #Hypertension Septic shock with bacteremia in the setting of biliary obstruction.  - On amiodarone  for rate control while she is on pressors  - Holding all home antihypertensives at this time   Pulmonary #History of asthma - Intermittently requiring 6L of [place] - Wean of oxygen  requirements ad tolerated  - Continue Brovana  and Pulmicort  nebs  GI #Concern for biliary source of infection #Concern for colitis on CT (surgery less concerned) # Transaminitis # Pancreatic cancer  #Possible liver abscess  - Underwent ERCP and noted to have occlusion of biliary stent, a new stent was placed inside the first one to avoid risk of bleeding. Her liver enzymes were downtrending but today they are slightly higher  - Continue to trend liver enzymes - NPO given clinical worsening in case of emergent procedure  - Can consider touching base with IR regarding possible drainage if she continues to be septic   ID # Septic shock # Bacteremia with E. coli and E faecalis - White count of 3 from 6.4 a day ago and 19.6 two days ago  - New blood culture has shown no new growth till date  - On zosyn  day 4  - Surgery will assess and make recommendations on the need to take port out  - I plan to continue antibiotics with end date of 7 days after port is taken out - Appreciate ID recommendations    Renal # AKI Likely in pre-renal in the setting  of sepsis and poor PO intake in the setting of NPO - Making minimal urine , will keep an eye of I/O  - Will trend Cr   Heme-onc #Leukocytosis in the setting of infection, leukopenia today  #Thrombocytopenia (dropped plt significantly to 70s today). This is likely in the setting of sepsis.  Unlikely to be HIT or antibiotic related. Could also be related to her 2nd cycle of chemo with gemcitabine /abraxane  which can cause thrombocytopnea as well as leukopenia  - Getting a DIC panel to further assess  #Microcytic anemia 2/2 AOCD - No active signs of bleeding,holding DVT prophylaxis per above   Endo  #DM  Blood glucose of 199 from 226 despite NPO likely due to her steroids -  Will continue SSI for now  - Holding her glipizide  and metformin  in the setting of sepsis and AKI - If her blood glucose continue to go up , will add back long acting base on her numbers    Best Practice (right click and Reselect all SmartList Selections daily)   Diet/type: NPO DVT prophylaxis Held - low plt  Pressure ulcer(s): N/A GI prophylaxis: N/A  Lines: N/A Foley:  N/A Code Status:  full code Last date of multidisciplinary goals of care discussion [NA]  Labs   CBC: Recent Labs  Lab 01/13/24 0915 01/13/24 0915 01/13/24 1601 01/14/24 0500 01/14/24 1448 01/15/24 0508 01/16/24 0333 01/16/24 1134 01/16/24 2242 01/17/24 0319  WBC 12.4*   < > 10.9* 27.7*  --  19.6* 7.7 6.4  --  3.0*  NEUTROABS 9.2*  --  7.4  --   --  18.2*  --  5.7  --  2.5  HGB 9.6*   < > 11.5* 7.7*   < > 8.1* 8.0* 9.2* 9.5* 8.3*  HCT 29.1*  --  36.2 23.9*   < > 24.5* 24.7* 28.7* 28.0* 25.4*  MCV 74.6*  --  75.9* 76.1*  --  75.2* 75.3* 75.7*  --  75.1*  PLT 547*   < > 575* 296  --  76* 35* 34*  --  19*   < > = values in this interval not displayed.    Basic Metabolic Panel: Recent Labs  Lab 01/14/24 0500 01/14/24 1448 01/14/24 1834 01/15/24 0508 01/16/24 0826 01/16/24 1134 01/16/24 2242 01/17/24 0319  NA 131*   < > 133* 132* 133* 133* 133* 133*  K 2.7*   < > 4.2 3.8 3.6 3.9 3.7 3.7  CL 99   < > 101 102 103 103  --  99  CO2 14*  --  18* 17* 20* 17*  --  20*  GLUCOSE 327*   < > 138* 161* 211* 237*  --  243*  BUN 13   < > 13 14 29* 30*  --  32*  CREATININE 1.28*   < > 0.95 1.15* 1.41* 1.36*  --  1.54*   CALCIUM  6.5*  --  7.0* 6.9* 7.0* 7.0*  --  7.0*  MG 1.2*  --   --  2.8*  --   --   --   --   PHOS 3.8  --   --   --   --   --   --   --    < > = values in this interval not displayed.   GFR: Estimated Creatinine Clearance: 39.3 mL/min (A) (by C-G formula based on SCr of 1.54 mg/dL (H)). Recent Labs  Lab 01/13/24 1602 01/13/24 1802 01/14/24 0500 01/15/24 0508 01/16/24 0333 01/16/24 1134 01/17/24 0319  WBC  --   --    < > 19.6* 7.7 6.4 3.0*  LATICACIDVEN 6.7* 4.9*  --  2.2*  --   --   --    < > = values in this interval not displayed.    Liver Function Tests: Recent Labs  Lab 01/14/24 0500 01/15/24 0900 01/16/24 0826 01/16/24 1134 01/17/24 0319  AST 354* 191* 111* 119* 152*  ALT 141* 136* 118* 128* 132*  ALKPHOS 331* 328* 349* 377* 344*  BILITOT 2.8* 2.6* 2.4* 2.5* 2.5*  PROT 5.4* 5.3* 5.2* 5.7* 5.7*  ALBUMIN  <1.5* 1.7* 1.5* 1.7* 1.6*   Recent Labs  Lab 01/13/24 1601  LIPASE 12   No results for input(s): AMMONIA in the last 168 hours.  ABG    Component Value Date/Time   PHART 7.294 (L) 01/16/2024 2242   PCO2ART 35.1 01/16/2024 2242   PO2ART 73 (L) 01/16/2024 2242   HCO3 17.1 (L) 01/16/2024 2242   TCO2 18 (L) 01/16/2024 2242   ACIDBASEDEF 9.0 (H) 01/16/2024 2242   O2SAT 93 01/16/2024 2242     Coagulation Profile: Recent Labs  Lab 01/14/24 0500  INR 2.0*    Cardiac Enzymes: No results for input(s): CKTOTAL, CKMB, CKMBINDEX, TROPONINI in the last 168 hours.  HbA1C: Hgb A1c MFr Bld  Date/Time Value Ref Range Status  11/23/2023 02:59 PM 10.1 (H) 4.6 - 6.5 % Final    Comment:    Glycemic Control Guidelines for People with Diabetes:Non Diabetic:  <6%Goal of Therapy: <7%Additional Action Suggested:  >8%   07/19/2023 03:41 PM 7.0 (H) 4.6 - 6.5 % Final    Comment:    Glycemic Control Guidelines for People with Diabetes:Non Diabetic:  <6%Goal of Therapy: <7%Additional Action Suggested:  >8%     CBG: Recent Labs  Lab 01/16/24 1117  01/16/24 1522 01/16/24 1943 01/16/24 2354 01/17/24 0317  GLUCAP 231* 244* 247* 227* 226*    Review of Systems:   Patient complaining of shortness of breath and leg pain. ROS otherwise negative   Past Medical History:  She,  has a past medical history of Asthma, CAD (coronary artery disease) (11/07/2003), Cancer (HCC), Chicken pox, CVA (cerebrovascular accident) (HCC) (06/09/1999), Diabetes mellitus, Diastolic dysfunction, Empty sella syndrome (HCC), GERD (gastroesophageal reflux disease), Granulomatous disease, chronic (HCC), H/O bilateral cataract extraction, Hyperlipidemia, Hypertension, NAFLD (nonalcoholic fatty liver disease), OA (osteoarthritis), and Reactive airway disease.   Surgical History:   Past Surgical History:  Procedure Laterality Date   BILIARY BRUSHING  12/08/2023   Procedure: BRUSH BIOPSY, BILE DUCT;  Surgeon: Rosalie Kitchens, MD;  Location: [facility] ENDOSCOPY;  Service: Gastroenterology;;   BILIARY STENT PLACEMENT  12/08/2023   Procedure: INSERTION, STENT, BILE DUCT;  Surgeon: Rosalie Kitchens, MD;  Location: [facility] ENDOSCOPY;  Service: Gastroenterology;;   ORIN MEDIATE RELEASE  1992    left arm 1992   CHOLECYSTECTOMY  2003   ERCP N/A 12/08/2023   Procedure: ERCP, WITH INTERVENTION IF INDICATED;  Surgeon: Rosalie Kitchens, MD;  Location: [facility] ENDOSCOPY;  Service: Gastroenterology;  Laterality: N/A;   PORTACATH PLACEMENT Left 12/27/2023   Procedure: INSERTION, TUNNELED CENTRAL VENOUS DEVICE, WITH PORT;  Surgeon: Dasie Leonor CROME, MD;  Location: [facility] OR;  Service: General;  Laterality: Left;  PORT-A-CATH INSERTION   TONSILLECTOMY AND ADENOIDECTOMY       Social History:   reports that she has quit smoking. Her smoking use included cigarettes. She has a 1 pack-year smoking history. She has never used smokeless tobacco. She reports that she does not drink alcohol  and does not use drugs.   Family History:  Her family history includes Hypertension in her mother and sister; Lung cancer in her mother and  sister.   Allergies Allergies  Allergen Reactions   Gabapentin  Shortness Of Breath   Telmisartan  Shortness Of Breath, Swelling and Other (See Comments)    Shortness of breath, foot swelling   Clopidogrel Bisulfate Other (See Comments)    REACTION: weak, tired, sleepy, tightness (6/05); soreness in hip (11/05)   Crestor  [Rosuvastatin  Calcium ] Other (See Comments)    Did not like how she felt on it - stomach upset, nausea   Furosemide Other (See Comments)    REACTION: did not tolerate and preferred HCTZ   Lisinopril Other (See Comments)    REACTION: cough, right sided pain   Metoprolol  Tartrate Other (See Comments)    REACTION: lightheadedness, h/a cough (7/04); heart gong to stop (7/06); sluggishnes and depressed feeling (2/07); weakness/heaviness 6/07)   Mometasone Furoate Other (See Comments)    REACTION: neck tenderness   Ozempic  (0.25 Or 0.5 Mg-Dose) [Semaglutide (0.25 Or 0.5mg -Dos)] Other (See Comments)    Blurry vision  Home Medications  Prior to Admission medications   Medication Sig Start Date End Date Taking? Authorizing Provider  albuterol  (VENTOLIN  HFA) 108 (90 Base) MCG/ACT inhaler INHALE 2 PUFFS INTO THE LUNGS EVERY 4 (FOUR) HOURS AS NEEDED FOR SHORTNESS OF BREATH. 03/12/23   Geofm Glade PARAS, MD  amLODipine  (NORVASC ) 10 MG tablet Take 1 tablet (10 mg total) by mouth daily. 03/12/23   Geofm Glade PARAS, MD  cyanocobalamin  (VITAMIN B12) 500 MCG tablet Take 500 mcg by mouth daily.    [provider]  fluticasone  (FLONASE ) 50 MCG/ACT nasal spray Place 2 sprays into both nostrils daily. 03/12/23   Geofm Glade PARAS, MD  glipiZIDE  (GLUCOTROL ) 10 MG tablet TAKE 2 TABLETS BY MOUTH 2 TIMES DAILY BEFORE A MEAL. 03/12/23   Geofm Glade PARAS, MD  hydrochlorothiazide  (HYDRODIURIL ) 25 MG tablet Take 1 tablet (25 mg total) by mouth daily. 11/23/23   Geofm Glade PARAS, MD  HYDROcodone -acetaminophen  (NORCO) 10-325 MG tablet Take 1-2 tablets by mouth every 4 (four) hours as needed for moderate  pain (pain score 4-6) or severe pain (pain score 7-10). 12/13/23   Patsy Lenis, MD  ibuprofen  (ADVIL ) 800 MG tablet TAKE 1 TABLET BY MOUTH TWICE DAILY AS NEEDED FOR MODERATE PAIN 11/30/23   Geofm Glade PARAS, MD  insulin  glargine (LANTUS ) 100 UNIT/ML Solostar Pen Inject 20 Units into the skin daily. 11/30/23   Geofm Glade PARAS, MD  lidocaine -prilocaine  (EMLA ) cream Apply 1 Application topically as needed (Apply to Port 1-2 hours prior to its use start using with 2nd treatment). Patient not taking: No sig reported 12/24/23   Cloretta Arley NOVAK, MD  linaclotide  (LINZESS ) 145 MCG CAPS capsule TAKE 1 CAPSULE BY MOUTH DAILY BEFORE BREAKFAST. Patient not taking: Reported on 12/07/2023 03/12/23   Geofm Glade PARAS, MD  metFORMIN  (GLUCOPHAGE -XR) 500 MG 24 hr tablet TAKE 2 TABLETS BY MOUTH TWICE DAILY BEFORE A MEAL Patient taking differently: Take 500 mg by mouth 2 (two) times daily with a meal. TAKE 2 TABLETS BY MOUTH TWICE DAILY BEFORE A MEAL 09/23/23   Burns, Glade PARAS, MD  montelukast  (SINGULAIR ) 10 MG tablet TAKE 1 TABLET BY MOUTH EVRY DAY AT BEDTIME 09/23/23   Geofm Glade PARAS, MD  naloxone  (NARCAN ) nasal spray 4 mg/0.1 mL 1 spray in one nostril x1; may repeat dose in alternate nostrils q2-3min prn Patient not taking: No sig reported 03/07/23   Geofm Glade PARAS, MD  ondansetron  (ZOFRAN ) 8 MG tablet Take 1 tablet (8 mg total) by mouth every 8 (eight) hours as needed. Patient not taking: No sig reported 12/24/23   Cloretta Arley NOVAK, MD  potassium chloride  (MICRO-K ) 10 MEQ CR capsule TAKE 1 CAPSULE BY MOUTH TWICE A DAY 06/07/23   Geofm Glade PARAS, MD  prochlorperazine  (COMPAZINE ) 10 MG tablet Take 1 tablet (10 mg total) by mouth every 6 (six) hours as needed for nausea or vomiting. Patient not taking: No sig reported 12/24/23   Cloretta Arley NOVAK, MD           Drue Grow, MD [facility] 01/17/2024 7:03 AM

## 2024-01-17 NOTE — Progress Notes (Signed)
IP PROGRESS NOTE  Subjective:   Lindsay Hardy is lethargic after receiving pain medication this morning.  The nurse is at the bedside and reports she received fentanyl  and Robaxin  approximately 1 hour ago.  Objective: Vital signs in last 24 hours: Blood pressure (!) 90/58, pulse 96, temperature 97.6 F (36.4 C), temperature source Oral, resp. rate (!) 25, height 5' 5 (1.651 m), weight 238 lb 15.7 oz (108.4 kg), SpO2 100%.  Intake/Output from previous day: 08/10 0701 - 08/11 0700 In: 2731.4 [I.V.:1390.1; IV Piggyback:1341.3] Out: 320 [Urine:320]  Physical Exam:  HEENT: No thrush Lungs: Moves air bilaterally, increased respiratory rate Cardiac: Irregular Abdomen: Soft, nontender Extremities: No leg edema Skin: No ecchymoses Neurologic: Lethargic, arousable, not speaking, follows a few commands, moves extremities to command  Portacath/PICC-without erythema  Lab Results: Recent Labs    01/16/24 1134 01/16/24 2242 01/17/24 0319  WBC 6.4  --  3.0*  HGB 9.2* 9.5* 8.3*  HCT 28.7* 28.0* 25.4*  PLT 34*  --  19*    BMET Recent Labs    01/16/24 1134 01/16/24 2242 01/17/24 0319  NA 133* 133* 133*  K 3.9 3.7 3.7  CL 103  --  99  CO2 17*  --  20*  GLUCOSE 237*  --  243*  BUN 30*  --  32*  CREATININE 1.36*  --  1.54*  CALCIUM  7.0*  --  7.0*    Lab Results  Component Value Date   CAN199 <2 12/09/2023    Studies/Results: DG CHEST PORT 1 VIEW Result Date: 01/16/2024 CLINICAL DATA:  Tachypnea EXAM: PORTABLE CHEST 1 VIEW COMPARISON:  12/27/2023 FINDINGS: Cardiac shadow is mildly prominent but accentuated by the frontal technique. Right chest wall port is again seen. Increased central vascular congestion is noted without focal confluent infiltrate or edema. No bony abnormality is noted. IMPRESSION: Changes consistent with CHF without significant edema. Electronically Signed   By: Oneil Devonshire M.D.   On: 01/16/2024 22:46    Medications: I have reviewed the patient's current  medications.  Assessment/Plan: Pancreas cancer, stage Ib (cT2c N0) 12/02/2023 CT abdomen/pelvis: Ill-defined hypoattenuating pancreas head/uncinate lesion with pancreatic biliary duct dilation, unchanged adjacent cystic focus in the pancreas uncinate 8, incompletely characterized 1.5 cm hypodense left lower pole and 1.5 cm hypoattenuating focus in the lower pole left kidney 12/07/2023 CT Abdo/pelvis: Dilation of the intra and extrahepatic biliary tree, ill-defined low attenuating area in the region of the uncinate of the pancreas with a small adjacent cyst, no adenopathy 12/07/2023: MRI abdomen/MRCP: Severe intra and exophytic biliary duct dilation, 3.4 x 2.3 cm mass in the inferior pancreas head and uncinate 8 with effacement of the pancreatic and common ducts, cystic change in the adjacent uncinate 8, mass abuts the central dural SMV with less than 50% contact, fat planes preserved to the SMA, no enlarged lymph nodes, kidneys are normal  12/08/2023 ERCP bile duct brushing-adenocarcinoma 12/09/2023: CT chest-centrilobular emphysema, no evidence of metastatic disease Cycle 1 gemcitabine /Abraxane  12/30/2023 Cycle 2 gemcitabine /Abraxane  01/13/2024   Obstructive jaundice secondary to 1, status post placement of a covered common duct stent 12/08/2023 Diabetes 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 Coli ERCP 01/14/2024-occluded bile duct stent, pus swept from the duct, plastic stent placed, metal stent left in place 12.  Thrombocytopenia secondary to chemotherapy and sepsis syndrome   Lindsay Hardy was admitted with biliary sepsis.  She continues to require pressor support.  The liver enzymes are  elevated following placement of a plastic bile duct stent 01/14/2024.  She continues antibiotics.  Infectious disease has commended removal of the Port-A-Cath given the potential of seeding from the bacteremia.  Lindsay Hardy has developed severe thrombocytopenia, likely secondary to sepsis and chemotherapy.  Recommendations: Management of sepsis syndrome, respiratory failure per critical care medicine Follow daily CBC with differential, transfuse platelets for a count of less than 10,000 or bleeding Consider drainage of liver possible liver abscess per critical care medicine/infectious disease Please call oncology as needed, I will continue to follow her in the hospital and outpatient follow-up will be scheduled Cancer center      LOS: 4 days   Arley Hof, MD   01/17/2024, 8:06 AM

## 2024-01-17 NOTE — Progress Notes (Signed)
[facility] Gastroenterology Progress Note  Lindsay Hardy 73 y.o. 1950-11-19  CC: Bacteremia, cholangitis  Subjective: Patient seen and examined at bedside.  She is sleepy and lethargic.  ROS : Not able to obtain   Objective: Vital signs in last 24 hours: Vitals:   01/17/24 0500 01/17/24 0600  BP: 120/82 (!) 90/58  Pulse: 96 96  Resp: (!) 21 (!) 25  Temp:    SpO2: 98% 100%    Physical Exam: Resting comfortably, lethargic.  Abdomen minimally distended but otherwise nontender.  Bowel sound present.  No peritoneal signs.   Lab Results: Recent Labs    01/15/24 0508 01/16/24 0826 01/16/24 1134 01/16/24 2242 01/17/24 0319  NA 132*   < > 133* 133* 133*  K 3.8   < > 3.9 3.7 3.7  CL 102   < > 103  --  99  CO2 17*   < > 17*  --  20*  GLUCOSE 161*   < > 237*  --  243*  BUN 14   < > 30*  --  32*  CREATININE 1.15*   < > 1.36*  --  1.54*  CALCIUM  6.9*   < > 7.0*  --  7.0*  MG 2.8*  --   --   --   --    < > = values in this interval not displayed.   Recent Labs    01/16/24 1134 01/17/24 0319  AST 119* 152*  ALT 128* 132*  ALKPHOS 377* 344*  BILITOT 2.5* 2.5*  PROT 5.7* 5.7*  ALBUMIN  1.7* 1.6*   Recent Labs    01/16/24 1134 01/16/24 2242 01/17/24 0319  WBC 6.4  --  3.0*  NEUTROABS 5.7  --  2.5  HGB 9.2* 9.5* 8.3*  HCT 28.7* 28.0* 25.4*  MCV 75.7*  --  75.1*  PLT 34*  --  19*   No results for input(s): LABPROT, INR in the last 72 hours.    Assessment/Plan: -Cholangitis and bacteremia in a patient with history of pancreatic cancer.    Status post ERCP with removal of debris's from clogged metal stent and placement of new plastic stent placement in the  previously placed metal stent few days ago.  - Elevated LFTs.  Likely from above. - Thrombocytopenia/pancytopenia. ?  From underlying infection  Recommendations -------------------------- - Continue IV antibiotics per ID recommendations. - Continue other supportive care per critical care team - May need to  consider IR consult for drainage of possible liver abscess  - GI will follow    Layla Lah MD, FACP 01/17/2024, 8:20 AM  Contact #  [phone]

## 2024-01-17 NOTE — Progress Notes (Signed)
Pharmacy Electrolyte Replacement  Recent Labs:  Recent Labs    01/15/24 0508 01/16/24 0826 01/17/24 0319  K 3.8   < > 3.7  MG 2.8*  --   --   CREATININE 1.15*   < > 1.54*   < > = values in this interval not displayed.    Low Critical Values (K </= 2.5, Phos </= 1, Mg </= 1) Present: None  Plan: Give KCL 10 mEq IV x 4 runs. Recheck levels with AM labs.   Alferd Obryant, PharmD

## 2024-01-17 NOTE — Progress Notes (Signed)
 Transported pt from 2M07 to CT4 and back with bedside RN. Vitals are stable.

## 2024-01-17 NOTE — Plan of Care (Signed)

## 2024-01-17 NOTE — Procedures (Addendum)
 Intubation Procedure Note  Lindsay Hardy  [id number]  [birth year]  Date:01/17/24  Time:4:34 PM   Provider Performing:Roland Prine CATHERINE    Procedure: Intubation (31500)  Indication(s) Respiratory Failure  Consent Unable to obtain consent due to emergent nature of procedure.   Anesthesia None was used as patient is obtunded   Time Out Verified patient identification, verified procedure, site/side was marked, verified correct patient position, special equipment/implants available, medications/allergies/relevant history reviewed, required imaging and test results available.   Sterile Technique Usual hand hygeine, masks, and gloves were used   Procedure Description Patient positioned in bed supine.  Sedation given as noted above.  Patient was intubated with endotracheal tube using Glidescope.  View was Grade 1 full glottis .  Number of attempts was 1.  Colorimetric CO2 detector was consistent with tracheal placement.   Complications/Tolerance None; patient tolerated the procedure well. Chest X-ray is ordered to verify placement.   EBL Minimal   Specimen(s) None

## 2024-01-17 NOTE — Progress Notes (Signed)
3 Days Post-Op   Subjective/Chief Complaint: No complaints   Objective: Vital signs in last 24 hours: Temp:  [77.5 F (25.3 C)-98.2 F (36.8 C)] 97.6 F (36.4 C) (08/11 0400) Pulse Rate:  [81-148] 81 (08/11 0915) Resp:  [18-74] 36 (08/11 0915) BP: (66-151)/(45-97) 111/58 (08/11 0915) SpO2:  [80 %-100 %] 100 % (08/11 0915) Weight:  [108.4 kg] 108.4 kg (08/11 0500) Last BM Date : 01/16/24  Intake/Output from previous day: 08/10 0701 - 08/11 0700 In: 2731.4 [I.V.:1390.1; IV Piggyback:1341.3] Out: 320 [Urine:320] Intake/Output this shift: Total I/O In: 299.8 [I.V.:130.1; IV Piggyback:169.7] Out: 20 [Urine:20]  General appearance: alert and cooperative Resp: clear to auscultation bilaterally Cardio: regular rate and rhythm GI: soft, nontender Right chest - port accessed, functioning, no surrounding erythema or pain  Lab Results:  Recent Labs    01/16/24 1134 01/16/24 2242 01/17/24 0319  WBC 6.4  --  3.0*  HGB 9.2* 9.5* 8.3*  HCT 28.7* 28.0* 25.4*  PLT 34*  --  19*   BMET Recent Labs    01/16/24 1134 01/16/24 2242 01/17/24 0319  NA 133* 133* 133*  K 3.9 3.7 3.7  CL 103  --  99  CO2 17*  --  20*  GLUCOSE 237*  --  243*  BUN 30*  --  32*  CREATININE 1.36*  --  1.54*  CALCIUM  7.0*  --  7.0*   PT/INR No results for input(s): LABPROT, INR in the last 72 hours.  ABG Recent Labs    01/14/24 1800 01/16/24 2242  PHART 7.305* 7.294*  HCO3 22.0 17.1*    Studies/Results: DG CHEST PORT 1 VIEW Result Date: 01/16/2024 CLINICAL DATA:  Tachypnea EXAM: PORTABLE CHEST 1 VIEW COMPARISON:  12/27/2023 FINDINGS: Cardiac shadow is mildly prominent but accentuated by the frontal technique. Right chest wall port is again seen. Increased central vascular congestion is noted without focal confluent infiltrate or edema. No bony abnormality is noted. IMPRESSION: Changes consistent with CHF without significant edema. Electronically Signed   By: Oneil Devonshire M.D.   On:  01/16/2024 22:46    Anti-infectives: Anti-infectives (From admission, onward)    Start     Dose/Rate Route Frequency Ordered Stop   01/17/24 1200  Ampicillin -Sulbactam (UNASYN ) 3 g in sodium chloride  0.9 % 100 mL IVPB        3 g 200 mL/hr over 30 Minutes Intravenous Every 6 hours 01/17/24 0946     01/14/24 2300  vancomycin  (VANCOCIN ) IVPB 1000 mg/200 mL premix  Status:  Discontinued        1,000 mg 200 mL/hr over 60 Minutes Intravenous Every 24 hours 01/13/24 2226 01/14/24 0740   01/13/24 2315  piperacillin -tazobactam (ZOSYN ) IVPB 3.375 g  Status:  Discontinued        3.375 g 12.5 mL/hr over 240 Minutes Intravenous Every 8 hours 01/13/24 2226 01/17/24 0946   01/13/24 1815  vancomycin  (VANCOCIN ) IVPB 1000 mg/200 mL premix       Placed in Followed by Linked Group   1,000 mg 200 mL/hr over 60 Minutes Intravenous  Once 01/13/24 1710 01/14/24 0006   01/13/24 1745  metroNIDAZOLE  (FLAGYL ) IVPB 500 mg        500 mg 100 mL/hr over 60 Minutes Intravenous  Once 01/13/24 1736 01/13/24 1901   01/13/24 1715  vancomycin  (VANCOCIN ) IVPB 1000 mg/200 mL premix       Placed in Followed by Linked Group   1,000 mg 200 mL/hr over 60 Minutes Intravenous  Once 01/13/24 1710 01/13/24 2200  01/13/24 1700  ceFEPIme  (MAXIPIME ) 2 g in sodium chloride  0.9 % 100 mL IVPB        2 g 200 mL/hr over 30 Minutes Intravenous  Once 01/13/24 1653 01/13/24 1817   01/13/24 1700  vancomycin  (VANCOCIN ) IVPB 1000 mg/200 mL premix  Status:  Discontinued        1,000 mg 200 mL/hr over 60 Minutes Intravenous  Once 01/13/24 1653 01/13/24 1710   01/13/24 1700  vancomycin  (VANCOREADY) IVPB 2000 mg/400 mL  Status:  Discontinued        2,000 mg 200 mL/hr over 120 Minutes Intravenous  Once 01/13/24 1657 01/13/24 1710       Assessment/Plan: Ms. Nordell had a port-a-cath inserted on 12/27/23 for pancreatic cancer.  She presented 01/13/24, with ascending cholangitis.  There is concern the port-a-cath needs to be removed due to  this infection.  Port-a-cath is functioning, accessed with no surrounding erythema or pain Recommend continuing antibiotics and treating cholangitis and possible liver abscess Once infection has been treated, if there is microbiology evidence that the port is infected, we can remove it If she develops local signs of infection around the port, we can remove it  Hopefully we can salvage the port and avoid removal and eventual replacement  Please call surgery team if the above issues arise and port needs removed    LOS: 4 days    Deward PARAS Exander Shaul 01/17/2024

## 2024-01-17 NOTE — Progress Notes (Signed)
 Pharmacy Antibiotic Note  Lindsay Hardy is a 73 y.o. female admitted on 01/13/2024 with Ecoli and E. Faecalis bacteremia s/p respiratory arrest with sepsis and possible new pneumonia. Pharmacy has been consulted for Zosyn  and vancomycin  dosing. Neutropenic, SCr up 1.87. LA back up 8.8. Pancreatic cancer with   Plan: Zosyn  3.375g IV every 8 hours -extended infusion.  Vancomycin  2000mg  IV x1 then 1250 mg IV every 48 hours. eAUC 478.  MD adding Micafungin  100mg  IV every 24 hours.  Monitor renal function, culture results, and clinical status.   Height: 5' 5 (165.1 cm) Weight: 108.4 kg (238 lb 15.7 oz) IBW/kg (Calculated) : 57  Temp (24hrs), Avg:96.9 F (36.1 C), Min:94.1 F (34.5 C), Max:98.2 F (36.8 C)  Recent Labs  Lab 01/13/24 1602 01/13/24 1802 01/14/24 0500 01/15/24 0508 01/16/24 0333 01/16/24 0826 01/16/24 1134 01/17/24 0319 01/17/24 1009 01/17/24 1646 01/17/24 1647  WBC  --   --    < > 19.6* 7.7  --  6.4 3.0* 1.8*  --  2.3*  CREATININE  --   --    < > 1.15*  --  1.41* 1.36* 1.54* 1.61* 1.87*  --   LATICACIDVEN 6.7* 4.9*  --  2.2*  --   --   --   --   --   --  8.8*   < > = values in this interval not displayed.    Estimated Creatinine Clearance: 32.3 mL/min (A) (by C-G formula based on SCr of 1.87 mg/dL (H)).    Allergies  Allergen Reactions   Gabapentin  Shortness Of Breath   Telmisartan  Shortness Of Breath, Swelling and Other (See Comments)    Shortness of breath, foot swelling   Clopidogrel Bisulfate Other (See Comments)    REACTION: weak, tired, sleepy, tightness (6/05); soreness in hip (11/05)   Crestor  [Rosuvastatin  Calcium ] Other (See Comments)    Did not like how she felt on it - stomach upset, nausea   Furosemide Other (See Comments)    REACTION: did not tolerate and preferred HCTZ   Lisinopril Other (See Comments)    REACTION: cough, right sided pain   Metoprolol  Tartrate Other (See Comments)    REACTION: lightheadedness, h/a cough (7/04); heart gong to  stop (7/06); sluggishnes and depressed feeling (2/07); weakness/heaviness 6/07)   Mometasone Furoate Other (See Comments)    REACTION: neck tenderness   Ozempic  (0.25 Or 0.5 Mg-Dose) [Semaglutide (0.25 Or 0.5mg -Dos)] Other (See Comments)    Blurry vision    8/7 Cefepime  x1 8/7 Flagyl  x1 8/7 Zosyn  >> 8/11; 8/11 >> 8/7 Vanc >>8/8; 8/11 >> 8/11 Unasyn  x1 8/11 Micafungin  >>   8/7 BCx:E faecalis - sensi pending, Ecoli (pan sens)  8/7 MRSA PCR: negative  Thank you for allowing pharmacy to be a part of this patient's care.  Harlene Boga, PharmD, BCPS, BCCCP Please refer to [facility] for [facility] Pharmacy numbers 01/17/2024 5:45 PM

## 2024-01-17 NOTE — Progress Notes (Signed)
 Arrived for code blue activation.  Per MD and bedside RN no further IV access is needed.

## 2024-01-17 NOTE — IPAL (Signed)
 I had a family meeting with patient's Son, Koren, as well as multiple nieces and nephews. I had an extended discussion regarding goals of care including the patient's moribund prognosis and likelihood of septic shock with multiorgan failure in the setting of pancreatic cancer. The patient's family understood the conversation and noted that they would like to do a time limited trial of interventions before they would withdraw care. I suggested that they make patient DNR since I'm already doing all resuscitation measures aside from chest compressions. I noted that the likelihood that chest compressions and ACLS in setting of cardiac arrest would not yield a positive outcome as patient unlikely to survive with that. The family understood and agreed to DNR for now. We will continue goals of care discussions regarding overall grim outlook.

## 2024-01-17 NOTE — Progress Notes (Signed)
  Hypoglycemic Event  CBG: 47, rechecked with serum at 51  Treatment: D50 50 mL (25 gm)  Symptoms: None  Follow-up CBG: Time:0040 CBG Result:110  Possible Reasons for Event: Unknown  Comments/MD notified:Elink MD     Christa Fasig O Jadrian Bulman

## 2024-01-17 NOTE — Inpatient Diabetes Management (Signed)
Inpatient Diabetes Program Recommendations  AACE/ADA: New Consensus Statement on Inpatient Glycemic Control (2015)  Target Ranges:  Prepandial:   less than 140 mg/dL      Peak postprandial:   less than 180 mg/dL (1-2 hours)      Critically ill patients:  140 - 180 mg/dL   Lab Results  Component Value Date   GLUCAP 199 (H) 01/17/2024   HGBA1C 10.1 (H) 11/23/2023    Review of Glycemic Control  Latest Reference Range & Units 01/16/24 07:13 01/16/24 11:17 01/16/24 15:22 01/16/24 19:43 01/16/24 23:54 01/17/24 03:17 01/17/24 07:18  Glucose-Capillary 70 - 99 mg/dL 810 (H) 768 (H) 755 (H) 247 (H) 227 (H) 226 (H) 199 (H)  (H): Data is abnormally high Diabetes history: Type 2 DM Outpatient Diabetes medications: Glipizide  20 mg BID, Lantus  20 units every day, Metformin  1000 mg BID Current orders for Inpatient glycemic control: Novolog  0-20 units Q4H, Semglee  15 units at bedtime  Solumedrol 60 mg BID (completed)  Inpatient Diabetes Program Recommendations:    Consider: Increasing Semglee  to 20 units QHS  Thanks, Tinnie Minus, MSN, RNC-OB Diabetes Coordinator [phone] (8a-5p)

## 2024-01-17 NOTE — Progress Notes (Signed)
 Patient heart rate dropped down into the 20-30's, RN entered the room and code was called. No pulses were lost during the process but one push of atropine  was given per bedside MD order at 1617. Transcutaneous pacing pads were placed on the patient and calcium  and one amp of bicarb was given per MD order. The patient was emergently intubated due to respiratory distress and inadequate ventilation and then transported with RN, RT and RPH to STAT CT of the head.

## 2024-01-17 NOTE — Progress Notes (Signed)
   01/17/24 1600  Spiritual Encounters  Type of Visit Initial   Chaplain reported to code blue.  Patient is currently cared for by the medical team.  No family at bedside. Chaplain spiritual support services remain available as the need arises.

## 2024-01-17 NOTE — Procedures (Signed)
 Central Venous Catheter Insertion Procedure Note  Lindsay Hardy  [id number]  [birth year]  Date:01/17/24  Time:7:31 PM   Provider Performing:Kyler Germer CATHERINE   Procedure: Insertion of Non-tunneled Central Venous Catheter(36556) with US  guidance (23062)   Indication(s) Medication administration and Difficult access Patient lost port access and is on pressors. Emergent situation.  Consent Unable to obtain consent due to emergent nature of procedure.  Anesthesia N/A, patient obtunded.  Timeout Verified patient identification, verified procedure, site/side was marked, verified correct patient position, special equipment/implants available, medications/allergies/relevant history reviewed, required imaging and test results available.  Sterile Technique Maximal sterile technique including full sterile barrier drape, hand hygiene, sterile gown, sterile gloves, mask, hair covering, sterile ultrasound probe cover (if used).  Procedure Description Area of catheter insertion was cleaned with chlorhexidine  and draped in sterile fashion.  With real-time ultrasound guidance a central venous catheter was placed into the right internal jugular vein. Nonpulsatile blood flow and easy flushing noted in all ports.  The catheter was sutured in place and sterile dressing applied.  Complications/Tolerance None; patient tolerated the procedure well. Chest X-ray is ordered to verify placement for internal jugular. EBL Minimal  Specimen(s) None

## 2024-01-18 LAB — BPAM PLATELET PHERESIS
Blood Product Expiration Date: 202508142359
Blood Product Expiration Date: 202508132359
Blood Product Expiration Date: 202508142359
ISSUE DATE / TIME: 202508112117
ISSUE DATE / TIME: 202508111813
ISSUE DATE / TIME: 202508111949
Unit Type and Rh: 6200
Unit Type and Rh: 6200
Unit Type and Rh: 7300

## 2024-01-18 LAB — CBC WITH DIFFERENTIAL/PLATELET
Basophils Absolute: 0 K/uL (ref 0.0–0.1)
Basophils Relative: 0 %
Eosinophils Absolute: 0 K/uL (ref 0.0–0.5)
Eosinophils Relative: 0 %
HCT: 23.1 % — ABNORMAL LOW (ref 36.0–46.0)
Hemoglobin: 7.1 g/dL — ABNORMAL LOW (ref 12.0–15.0)
Lymphocytes Relative: 24 %
Lymphs Abs: 0.2 K/uL — ABNORMAL LOW (ref 0.7–4.0)
MCH: 24.3 pg — ABNORMAL LOW (ref 26.0–34.0)
MCHC: 30.7 g/dL (ref 30.0–36.0)
MCV: 79.1 fL — ABNORMAL LOW (ref 80.0–100.0)
Monocytes Absolute: 0.2 K/uL (ref 0.1–1.0)
Monocytes Relative: 15 %
Neutro Abs: 0.6 K/uL — ABNORMAL LOW (ref 1.7–7.7)
Neutrophils Relative %: 61 %
Platelets: 51 K/uL — ABNORMAL LOW (ref 150–400)
RBC: 2.92 MIL/uL — ABNORMAL LOW (ref 3.87–5.11)
RDW: 18.3 % — ABNORMAL HIGH (ref 11.5–15.5)
WBC: 1 K/uL — CL (ref 4.0–10.5)
nRBC: 0 % (ref 0.0–0.2)

## 2024-01-18 LAB — TYPE AND SCREEN
ABO/RH(D): B POS
Antibody Screen: POSITIVE
DAT, IgG: NEGATIVE
Donor AG Type: NEGATIVE
Donor AG Type: NEGATIVE
PT AG Type: NEGATIVE
Unit division: 0
Unit division: 0

## 2024-01-18 LAB — BPAM RBC
Blood Product Expiration Date: 202508312359
Blood Product Expiration Date: 202509052359
ISSUE DATE / TIME: 202508112051
Unit Type and Rh: 7300
Unit Type and Rh: 7300

## 2024-01-18 LAB — CULTURE, BLOOD (ROUTINE X 2): Special Requests: ADEQUATE

## 2024-01-18 LAB — POCT I-STAT 7, (LYTES, BLD GAS, ICA,H+H)
Acid-base deficit: 15 mmol/L — ABNORMAL HIGH (ref 0.0–2.0)
Bicarbonate: 12.3 mmol/L — ABNORMAL LOW (ref 20.0–28.0)
Calcium, Ion: 0.98 mmol/L — ABNORMAL LOW (ref 1.15–1.40)
HCT: 28 % — ABNORMAL LOW (ref 36.0–46.0)
Hemoglobin: 9.5 g/dL — ABNORMAL LOW (ref 12.0–15.0)
O2 Saturation: 97 %
Patient temperature: 97.5
Potassium: 4.9 mmol/L (ref 3.5–5.1)
Sodium: 133 mmol/L — ABNORMAL LOW (ref 135–145)
TCO2: 13 mmol/L — ABNORMAL LOW (ref 22–32)
pCO2 arterial: 32.3 mmHg (ref 32–48)
pH, Arterial: 7.186 — CL (ref 7.35–7.45)
pO2, Arterial: 108 mmHg (ref 83–108)

## 2024-01-18 LAB — PREPARE PLATELET PHERESIS
Unit division: 0
Unit division: 0
Unit division: 0

## 2024-01-18 LAB — GLUCOSE, CAPILLARY: Glucose-Capillary: 110 mg/dL — ABNORMAL HIGH (ref 70–99)

## 2024-01-18 LAB — BASIC METABOLIC PANEL WITH GFR
Anion gap: 23 — ABNORMAL HIGH (ref 5–15)
BUN: 38 mg/dL — ABNORMAL HIGH (ref 8–23)
CO2: 12 mmol/L — ABNORMAL LOW (ref 22–32)
Calcium: 7.2 mg/dL — ABNORMAL LOW (ref 8.9–10.3)
Chloride: 98 mmol/L (ref 98–111)
Creatinine, Ser: 2.17 mg/dL — ABNORMAL HIGH (ref 0.44–1.00)
GFR, Estimated: 23 mL/min — ABNORMAL LOW (ref >60)
Glucose, Bld: 128 mg/dL — ABNORMAL HIGH (ref 70–99)
Potassium: 4.9 mmol/L (ref 3.5–5.1)
Sodium: 133 mmol/L — ABNORMAL LOW (ref 135–145)

## 2024-01-18 MED ORDER — DEXTROSE 50 % IV SOLN: 12.5000 g | INTRAVENOUS | Status: DC | PRN

## 2024-01-18 MED ORDER — DEXTROSE IN LACTATED RINGERS 5 % IV SOLN: INTRAVENOUS | Status: DC

## 2024-01-18 MED ORDER — HEPARIN SOD (PORK) LOCK FLUSH 100 UNIT/ML IV SOLN
500.0000 [IU] | Freq: Once | INTRAVENOUS | Status: DC
  Filled 2024-01-18: qty 5

## 2024-01-18 MED ORDER — SODIUM CHLORIDE 0.45 % IV SOLN
INTRAVENOUS | Status: DC
  Filled 2024-01-18: qty 75

## 2024-01-19 NOTE — Anesthesia Postprocedure Evaluation (Signed)
 Anesthesia Post Note  Patient: Lindsay Hardy  Procedure(s) Performed: ERCP, WITH INTERVENTION IF INDICATED INSERTION, STENT, BILE DUCT     Patient location during evaluation: SICU Anesthesia Type: General Level of consciousness: sedated Pain management: pain level controlled Vital Signs Assessment: post-procedure vital signs reviewed and stable Respiratory status: patient remains intubated per anesthesia plan Cardiovascular status: stable Postop Assessment: no apparent nausea or vomiting Anesthetic complications: no   There were no known notable events for this encounter.              Amram Maya

## 2024-01-20 LAB — CULTURE, BLOOD (ROUTINE X 2)
Culture: NO GROWTH
Culture: NO GROWTH
Special Requests: ADEQUATE
Special Requests: ADEQUATE

## 2024-01-27 ENCOUNTER — Other Ambulatory Visit

## 2024-01-27 ENCOUNTER — Ambulatory Visit: Admitting: Oncology

## 2024-01-27 ENCOUNTER — Ambulatory Visit

## 2024-02-01 ENCOUNTER — Encounter: Payer: Self-pay | Admitting: Genetic Counselor

## 2024-02-06 DIAGNOSIS — M170 Bilateral primary osteoarthritis of knee: Secondary | ICD-10-CM | POA: Diagnosis not present

## 2024-02-07 NOTE — Progress Notes (Signed)
 eLink Physician-Brief Progress Note Patient Name: Lindsay Hardy DOB: [birth year] MRN: [id number]   Date of Service  01-22-2024  HPI/Events of Note  73 year old female with a history of pancreatic cancer in the ICU with polymicrobial bacteremia, sepsis with shock and respiratory failure.  Was called by RN because patient had become asystolic.  eICU Interventions  Video camera'd into room.  Asystole noted on telemetry monitor.  Requested RN to see you for a pulse again and none was palpated.  Veins of with no respiratory movement noted. Called patient's son Lindsay Hardy over the phone.  I explained to him that his mother had succumbed to her illness and passed.  He handed the phone over to his wife and I explained the same to her.  Family will come in and meet with nursing staff to discuss next steps. RN informed about phone conversation.     Intervention Category Major Interventions: End of life / care limitation discussion  Lindsay Hardy January 22, 2024, 12:59 AM

## 2024-02-07 NOTE — Progress Notes (Signed)
   2024-01-25 0400  Spiritual Encounters  Type of Visit Initial  Care provided to: [facility] partners present during encounter Nurse  Referral source Nurse (RN/NT/LPN)  Reason for visit Patient death  OnCall Visit No   Chaplain responded to a call for support of family members. I remained with the niece and nephew of the patient who were expected additional family to be coming in. We sat in silent reflection, I provided a patient placement card to the nephew for when they determined funeral placement.   Carley Birmingham [facility]  [phone]

## 2024-02-07 NOTE — Progress Notes (Signed)
 Critical ABG results verbalized to Deward Eastern, NP with CCM.

## 2024-02-07 NOTE — Progress Notes (Signed)
eLink Physician-Brief Progress Note Patient Name: Lindsay Hardy DOB: [birth year] MRN: [id number]   Date of Service  01-23-2024  HPI/Events of Note  CBG checked was 47.  Serum glucose check was 51.  Patient given an amp of D50.  eICU Interventions  Will start D5 LR at 50 cc/h.  Will also order 12.5 g of 50% glucose to be given as needed for hypoglycemic episodes.     Intervention Category Intermediate Interventions: Other: (Hypoglycemia)  Jerilynn Berg January 23, 2024, 12:10 AM

## 2024-02-07 NOTE — Progress Notes (Addendum)
 Patient asystole : contacted elink to camera in. Time of death 00: 56. Patient asystole Confirmed in 2 leads. No spontaneous signs of breathing. No pulse noted on exam. Pupils fixed and dilated. Spoke to MD who will call family / Confirmed by 2 nurses. [place] Chel[place] and [place] scott

## 2024-02-07 NOTE — Death Summary Note (Signed)
 DEATH SUMMARY   Patient Details  Name: Yadira Hada MRN: [id number] DOB: June 16, 1950  Admission/Discharge Information   Admit Date:  2024/02/07  Date of Death: Date of Death: 2024-02-12  Time of Death: Time of Death: 03-04-2051  Length of Stay: 5  Referring Physician: Geofm Glade PARAS, MD   Reason(s) for Hospitalization  73 y/o F with a PMH significant for DM, HTN, HLD, Obesity, CVA who was recently diagnosed with pancreatic cancer s/p 2 cycles of Gemcitabine /Abraxane  (cycle 2 02/07/24) who presents from infusion center for tachycardia found to have septic shock due to E. coli/E. faecalis bacteremia in the setting of cholangitis now s/p ERCP with stent placement due to biliary obstruction, extubated on 8/9, with course c/b a. fib with RVR, and acute onset obtundation, severe metabolic acidosis, pancytopenia, requiring emergent invasive mechanical ventilation due to suspected septic shock with multiorgan failure syndrome.  Diagnoses  Preliminary cause of death:  Secondary Diagnoses (including complications and co-morbidities):  Principal Problem:   Bacteremia Active Problems:   Septic shock (HCC)   Shock (HCC)   Polymicrobial bacterial infection   Enterococcus faecalis infection   Sepsis due to Escherichia coli (HCC)   Malignant neoplasm of pancreas (HCC)   Sepsis (HCC)   Acute hypoxic respiratory failure (HCC)   Thrombocytopenia [facility])   Brief Hospital Course (including significant findings, care, treatment, and services provided and events leading to death)  Exilda Wilhite is a 73 y.o. year old female who presented with tachycardia and hypotension. Found to have E. coli/E. faecalis bacteremia. Treated appropriately with antimicrobials during hospital stay. Source suspected to be biliary and patient underwent ERCP with stenting by GI. The patient was admitted to ICU for septic shock requiring vasopressors and was extubated on 8/9. She remained on vasopressos between 8/9 and 8/11. On 8/11, the  patient became progressively more obtunded, bradycardic, hypotensive requiring atropine , escalating doses of pressors, bicarb pushes, and emergent intubation. She also had hypoglycemia, severe high anion gap metabolic acidosis, high lactic acid at 8, and pancytopenia with PLT count nadir at 7. The patient sent to STAT CT head which ruled out head bleed. Escalating doses of levophed  were utilized and vasopressin  was started. Earlier in the AM, ID narrowed antimicrobials to Unasyn . During acute event, patient was broadened to Vancomycin , Zosyn , and Micafungin . Stress dose steroids was started. I had a family discussion at bedside (see IPAL note for details). Patient made DNR. It seems overnight, patient had asystole and was declared dead by [facility]. TOD: 12:52 AM on Feb 12, 2024.  Cause of Death: Septic shock with multiorgan failure Other causes: Pancreatic Cancer  Pertinent Labs and Studies  Significant Diagnostic Studies DG CHEST PORT 1 VIEW Result Date: 01/17/2024 CLINICAL DATA:  Central line placement EXAM: PORTABLE CHEST 1 VIEW COMPARISON:  Chest x-ray 01/17/2024 FINDINGS: Right chest port catheter tip projects over the distal SVC, unchanged. New right IJ catheter tip projects over the mid SVC. There is no pneumothorax. Enteric tube tip extends below the diaphragm. The cardiomediastinal silhouette is within normal limits. There is some patchy left lower lung opacities similar to prior. There is no pneumothorax or pleural effusion. No acute osseous abnormality. IMPRESSION: 1. New right IJ catheter tip projects over the mid SVC. No pneumothorax. 2. Patchy left lower lung opacities similar to prior. Electronically Signed   By: Greig Pique M.D.   On: 01/17/2024 20:30   CT HEAD WO CONTRAST ( ) Result Date: 01/17/2024 CLINICAL DATA:  Initial evaluation for acute delirium. EXAM: CT HEAD WITHOUT CONTRAST TECHNIQUE: Contiguous  axial images were obtained from the base of the skull through the vertex without  intravenous contrast. RADIATION DOSE REDUCTION: This exam was performed according to the departmental dose-optimization program which includes automated exposure control, adjustment of the mA and/or kV according to patient size and/or use of iterative reconstruction technique. COMPARISON:  CT from 01/13/2024 FINDINGS: Brain: Cerebral volume within normal limits. Mild chronic microvascular ischemic disease. Multiple scattered remote cortical infarcts involving the bilateral cerebral hemispheres as well as the left greater than right cerebellum, stable. No acute intracranial hemorrhage. No acute large vessel territory infarct. No mass lesion or midline shift. No hydrocephalus or extra-axial fluid collection. Empty sella noted. Vascular: No abnormal hyperdense vessel. Calcified atherosclerosis present at the skull base. Skull: Scalp soft tissues within normal limits. Calvarium intact. Osseous structures are density sclerotic in appearance. Sinuses/Orbits: Globes orbital soft tissues within normal limits. Paranasal sinuses and mastoid air cells are largely clear. Endotracheal and enteric tubes in place. Other: None. IMPRESSION: 1. No acute intracranial abnormality. 2. Multiple scattered remote infarcts involving the bilateral cerebral hemispheres and cerebellum, stable. Electronically Signed   By: Morene Hoard M.D.   On: 01/17/2024 18:44   DG Abd Portable 1V Result Date: 01/17/2024 CLINICAL DATA:  [id number] Encounter for intubation [id number] Encounter for orogastric tube placement [id number] EXAM: PORTABLE ABDOMEN - 1 VIEW COMPARISON:  December 30, 2013, January 13, 2024 FINDINGS: Significant gaseous distension of the stomach. Esophagogastric tube terminates in the stomach, well-positioned. No pneumoperitoneum. Metallic common bile duct stent. IMPRESSION: Well-positioned esophagogastric tube terminating in the gas distended stomach. Electronically Signed   By: Rogelia Myers M.D.   On: 01/17/2024 18:08   DG Chest  Port 1 View Result Date: 01/17/2024 EXAM: 1 VIEW XRAY OF THE CHEST 01/17/2024 04:51:00 PM COMPARISON: 01/16/2024 CLINICAL HISTORY: Encounter for intubation, and encounter for OG tube placement. FINDINGS: LUNGS AND PLEURA: Mild pulmonary venous congestion, minimally improved. Left Perihilar Airspace disease is unchanged. No pulmonary edema. No pleural effusion. No pneumothorax. HEART AND MEDIASTINUM: Mild cardiomegaly. Atherosclerosis in the transverse aorta. No acute abnormality of the cardiac and mediastinal silhouettes. BONES AND SOFT TISSUES: Median sternotomy. No acute osseous abnormality. LINES AND TUBES: Endotracheal tube terminating 3.0 cm above carina. Nasogastric tube terminates beyond the inferior aspect of the film. Right-sided port-a-cath terminates at the superior caval/atrial junction. IMPRESSION: 1. Endotracheal tube appropriately positioned. 2. Mild pulmonary venous congestion, minimally improved. 3. Similar left-sided airspace disease, likely alveolar edema versus less likely infection. 4. Mild cardiomegaly and atherosclerosis in the transverse aorta. Electronically signed by: Rockey Kilts MD 01/17/2024 06:04 PM EDT RP Workstation: [id number]   DG ERCP Result Date: 01/16/2024 CLINICAL DATA:  Known biliary stent, pancreatic carcinoma and ascending cholangitis EXAM: ERCP TECHNIQUE: Multiple spot images obtained with the fluoroscopic device and submitted for interpretation post-procedure. FLUOROSCOPY: Radiation Exposure Index (as provided by the fluoroscopic device): 36.72 mGy COMPARISON:  CT from 01/13/2024 FINDINGS: Initial image demonstrates a metallic biliary stent in place. A guidewire was advanced into the common bile duct. Subsequent placement of plastic biliary stent is noted. IMPRESSION: Placement of plastic biliary stent within the known distal common bile duct metallic stent. These images were submitted for radiologic interpretation only. Please see the procedural report for the amount of  contrast and the fluoroscopy time utilized. Electronically Signed   By: Oneil Devonshire M.D.   On: 01/16/2024 22:48   DG CHEST PORT 1 VIEW Result Date: 01/16/2024 CLINICAL DATA:  Tachypnea EXAM: PORTABLE CHEST 1 VIEW COMPARISON:  12/27/2023 FINDINGS: Cardiac shadow  is mildly prominent but accentuated by the frontal technique. Right chest wall port is again seen. Increased central vascular congestion is noted without focal confluent infiltrate or edema. No bony abnormality is noted. IMPRESSION: Changes consistent with CHF without significant edema. Electronically Signed   By: Oneil Devonshire M.D.   On: 01/16/2024 22:46   ECHOCARDIOGRAM COMPLETE Result Date: 01/14/2024    ECHOCARDIOGRAM REPORT   Patient Name:   Tyrah Schroth Date of Exam: 01/14/2024 Medical Rec #:  [id number]     Height:       65.0 in Accession #:    [id number]    Weight:       236.3 lb Date of Birth:  February 07, 1951    BSA:          2.124 m Patient Age:    72 years      BP:           102/68 mmHg Patient Gender: F             HR:           112 bpm. Exam Location:  Inpatient Procedure: 2D Echo (Both Spectral and Color Flow Doppler were utilized during            procedure). Indications:    shock  History:        Patient has prior history of Echocardiogram examinations, most                 recent 10/16/2004. CAD, PAD, Signs/Symptoms:Bacteremia; Risk                 Factors:Dyslipidemia, Diabetes and Hypertension.  Sonographer:    Tinnie Barefoot RDCS Referring Phys: [id number] ORN [facility]  Sonographer Comments: Patient is obese and suboptimal subcostal window. Image acquisition challenging due to patient body habitus. IMPRESSIONS  1. Left ventricular ejection fraction, by estimation, is 50 to 55%. The left ventricle has low normal function. The left ventricle has no regional wall motion abnormalities. There is mild left ventricular hypertrophy. Left ventricular diastolic parameters are indeterminate.  2. Right ventricular systolic function is normal. The right  ventricular size is normal.  3. The mitral valve is normal in structure. Trivial mitral valve regurgitation.  4. The aortic valve is tricuspid. Aortic valve regurgitation is not visualized. Aortic valve sclerosis is present, with no evidence of aortic valve stenosis. FINDINGS  Left Ventricle: Left ventricular ejection fraction, by estimation, is 50 to 55%. The left ventricle has low normal function. The left ventricle has no regional wall motion abnormalities. Definity  contrast agent was given IV to delineate the left ventricular endocardial borders. The left ventricular internal cavity size was normal in size. There is mild left ventricular hypertrophy. Left ventricular diastolic parameters are indeterminate. Right Ventricle: The right ventricular size is normal. Right vetricular wall thickness was not assessed. Right ventricular systolic function is normal. Left Atrium: Left atrial size was normal in size. Right Atrium: Right atrial size was normal in size. Pericardium: Trivial pericardial effusion is present. Mitral Valve: The mitral valve is normal in structure. Trivial mitral valve regurgitation. Tricuspid Valve: The tricuspid valve is grossly normal. Tricuspid valve regurgitation is trivial. Aortic Valve: The aortic valve is tricuspid. Aortic valve regurgitation is not visualized. Aortic valve sclerosis is present, with no evidence of aortic valve stenosis. Pulmonic Valve: The pulmonic valve was normal in structure. Pulmonic valve regurgitation is not visualized. Aorta: The aortic root and ascending aorta are structurally normal, with no evidence of dilitation. IAS/Shunts: No atrial  level shunt detected by color flow Doppler.  LEFT VENTRICLE PLAX 2D LVIDd:         4.00 cm   Diastology LVIDs:         2.80 cm   LV e' medial:  6.64 cm/s LV PW:         1.10 cm   LV e' lateral: 9.57 cm/s LV IVS:        1.20 cm LVOT diam:     1.90 cm LV SV:         42 LV SV Index:   20 LVOT Area:     2.84 cm  RIGHT VENTRICLE RV  Basal diam:  2.90 cm RV S prime:     14.80 cm/s TAPSE (M-mode): 1.7 cm LEFT ATRIUM             Index        RIGHT ATRIUM           Index LA diam:        3.10 cm 1.46 cm/m   RA Area:     12.00 cm LA Vol (A2C):   51.8 ml 24.39 ml/m  RA Volume:   26.90 ml  12.67 ml/m LA Vol (A4C):   35.7 ml 16.81 ml/m LA Biplane Vol: 46.7 ml 21.99 ml/m  AORTIC VALVE LVOT Vmax:   95.60 cm/s LVOT Vmean:  63.300 cm/s LVOT VTI:    0.147 m  AORTA Ao Root diam: 3.00 cm Ao Asc diam:  3.40 cm MV A velocity: 117.00 cm/s                             SHUNTS                             Systemic VTI:  0.15 m                             Systemic Diam: 1.90 cm Vina Gull MD Electronically signed by Vina Gull MD Signature Date/Time: 01/14/2024/12:34:00 PM    Final    CT Angio Chest PE W and/or Wo Contrast Result Date: 01/13/2024 CLINICAL DATA:  Chest and abdominal pain. Pancreatic adenocarcinoma. Concern for pulmonary embolism. EXAM: CT ANGIOGRAPHY CHEST CT ABDOMEN AND PELVIS WITH CONTRAST TECHNIQUE: Multidetector CT imaging of the chest was performed using the standard protocol during bolus administration of intravenous contrast. Multiplanar CT image reconstructions and MIPs were obtained to evaluate the vascular anatomy. Multidetector CT imaging of the abdomen and pelvis was performed using the standard protocol during bolus administration of intravenous contrast. RADIATION DOSE REDUCTION: This exam was performed according to the departmental dose-optimization program which includes automated exposure control, adjustment of the mA and/or kV according to patient size and/or use of iterative reconstruction technique. CONTRAST:  OMNIPAQUE  IOHEXOL  350 MG/ML SOLN COMPARISON:  Chest CT dated 12/09/2023 and CT abdomen pelvis dated 12/07/2023. FINDINGS: Evaluation of this exam is limited due to respiratory motion. CTA CHEST FINDINGS Cardiovascular: There is no cardiomegaly or pericardial effusion. There is 3 vessel coronary vascular  calcification. Mild atherosclerotic calcification of the thoracic aorta. No aneurysmal dilatation or dissection. Evaluation of the pulmonary arteries is limited due to respiratory motion and suboptimal visualization of the peripheral branches. No central pulmonary artery embolus identified. Mediastinum/Nodes: No hilar or mediastinal adenopathy. The esophagus and the thyroid  gland are grossly unremarkable. No mediastinal  fluid collection. Right-sided Port-A-Cath tip close to the cavoatrial junction. Lungs/Pleura: Right suprahilar/apical scarring as seen on the prior CT. There is background of emphysema. Small scattered calcified granuloma. No focal consolidation, pleural effusion, or pneumothorax. The central airways are patent. Musculoskeletal: Degenerative changes of the spine. No acute osseous pathology. Review of the MIP images confirms the above findings. CT ABDOMEN and PELVIS FINDINGS No intra-abdominal free air or free fluid. Hepatobiliary: Fatty liver. Several rounded hypoenhancing areas within the liver measuring up to 3.3 x 2.8 cm may represent post treatment changes and areas of infarct. There is looping abscesses are less likely but not excluded. There is mild dilatation, post cholecystectomy. Interval placement of a Wallstent in the common bile duct. There is mild pneumobilia. Pancreas: Pancreatic atrophy, progressed since the prior CT. Ill-defined hypoenhancing area in the uncinate process of the pancreas is poorly visualized and suboptimally evaluated due to respiratory motion. Spleen: Normal in size without focal abnormality. Adrenals/Urinary Tract: The adrenal glands are unremarkable. There is no hydronephrosis on either side there is symmetric enhancement and excretion of contrast by both kidneys. The visualized ureters and urinary bladder appear unremarkable. Stomach/Bowel: Mild diffuse thickened appearance of the colon, likely related to underdistention. Colitis is less likely but not excluded  clinical correlation is recommended. There is no bowel obstruction. The appendix is not visualized with certainty. No inflammatory changes identified in the right lower quadrant. Vascular/Lymphatic: Mild aortoiliac atherosclerotic disease. The IVC is unremarkable. No portal venous gas. No adenopathy. Reproductive: Calcified uterine fibroids. No suspicious adnexal masses. Other: None Musculoskeletal: Degenerative changes of the spine. No acute osseous pathology. Review of the MIP images confirms the above findings. IMPRESSION: 1. No acute intrathoracic pathology. No central pulmonary artery embolus identified. 2. Interval placement of a Wallstent in the common bile duct. 3. Fatty liver. Several rounded hypoenhancing areas within the liver may represent post treatment changes and areas of infarct. Abscesses are less likely but not excluded. 4. Mild diffuse thickened appearance of the colon, likely related to underdistention. Colitis is less likely but not excluded clinical correlation is recommended. No bowel obstruction. 5.  Aortic Atherosclerosis (ICD10-I70.0). Electronically Signed   By: Vanetta Chou M.D.   On: 01/13/2024 16:52   CT ABDOMEN PELVIS W CONTRAST Result Date: 01/13/2024 CLINICAL DATA:  Chest and abdominal pain. Pancreatic adenocarcinoma. Concern for pulmonary embolism. EXAM: CT ANGIOGRAPHY CHEST CT ABDOMEN AND PELVIS WITH CONTRAST TECHNIQUE: Multidetector CT imaging of the chest was performed using the standard protocol during bolus administration of intravenous contrast. Multiplanar CT image reconstructions and MIPs were obtained to evaluate the vascular anatomy. Multidetector CT imaging of the abdomen and pelvis was performed using the standard protocol during bolus administration of intravenous contrast. RADIATION DOSE REDUCTION: This exam was performed according to the departmental dose-optimization program which includes automated exposure control, adjustment of the mA and/or kV according to  patient size and/or use of iterative reconstruction technique. CONTRAST:  OMNIPAQUE  IOHEXOL  350 MG/ML SOLN COMPARISON:  Chest CT dated 12/09/2023 and CT abdomen pelvis dated 12/07/2023. FINDINGS: Evaluation of this exam is limited due to respiratory motion. CTA CHEST FINDINGS Cardiovascular: There is no cardiomegaly or pericardial effusion. There is 3 vessel coronary vascular calcification. Mild atherosclerotic calcification of the thoracic aorta. No aneurysmal dilatation or dissection. Evaluation of the pulmonary arteries is limited due to respiratory motion and suboptimal visualization of the peripheral branches. No central pulmonary artery embolus identified. Mediastinum/Nodes: No hilar or mediastinal adenopathy. The esophagus and the thyroid  gland are grossly unremarkable. No  mediastinal fluid collection. Right-sided Port-A-Cath tip close to the cavoatrial junction. Lungs/Pleura: Right suprahilar/apical scarring as seen on the prior CT. There is background of emphysema. Small scattered calcified granuloma. No focal consolidation, pleural effusion, or pneumothorax. The central airways are patent. Musculoskeletal: Degenerative changes of the spine. No acute osseous pathology. Review of the MIP images confirms the above findings. CT ABDOMEN and PELVIS FINDINGS No intra-abdominal free air or free fluid. Hepatobiliary: Fatty liver. Several rounded hypoenhancing areas within the liver measuring up to 3.3 x 2.8 cm may represent post treatment changes and areas of infarct. There is looping abscesses are less likely but not excluded. There is mild dilatation, post cholecystectomy. Interval placement of a Wallstent in the common bile duct. There is mild pneumobilia. Pancreas: Pancreatic atrophy, progressed since the prior CT. Ill-defined hypoenhancing area in the uncinate process of the pancreas is poorly visualized and suboptimally evaluated due to respiratory motion. Spleen: Normal in size without focal  abnormality. Adrenals/Urinary Tract: The adrenal glands are unremarkable. There is no hydronephrosis on either side there is symmetric enhancement and excretion of contrast by both kidneys. The visualized ureters and urinary bladder appear unremarkable. Stomach/Bowel: Mild diffuse thickened appearance of the colon, likely related to underdistention. Colitis is less likely but not excluded clinical correlation is recommended. There is no bowel obstruction. The appendix is not visualized with certainty. No inflammatory changes identified in the right lower quadrant. Vascular/Lymphatic: Mild aortoiliac atherosclerotic disease. The IVC is unremarkable. No portal venous gas. No adenopathy. Reproductive: Calcified uterine fibroids. No suspicious adnexal masses. Other: None Musculoskeletal: Degenerative changes of the spine. No acute osseous pathology. Review of the MIP images confirms the above findings. IMPRESSION: 1. No acute intrathoracic pathology. No central pulmonary artery embolus identified. 2. Interval placement of a Wallstent in the common bile duct. 3. Fatty liver. Several rounded hypoenhancing areas within the liver may represent post treatment changes and areas of infarct. Abscesses are less likely but not excluded. 4. Mild diffuse thickened appearance of the colon, likely related to underdistention. Colitis is less likely but not excluded clinical correlation is recommended. No bowel obstruction. 5.  Aortic Atherosclerosis (ICD10-I70.0). Electronically Signed   By: Vanetta Chou M.D.   On: 01/13/2024 16:52   CT Head Wo Contrast Result Date: 01/13/2024 CLINICAL DATA:  Altered level of consciousness, pancreatic cancer EXAM: CT HEAD WITHOUT CONTRAST TECHNIQUE: Contiguous axial images were obtained from the base of the skull through the vertex without intravenous contrast. RADIATION DOSE REDUCTION: This exam was performed according to the departmental dose-optimization program which includes automated  exposure control, adjustment of the mA and/or kV according to patient size and/or use of iterative reconstruction technique. COMPARISON:  None Available. FINDINGS: Brain: There are indeterminate hypodensities within the right frontal subcortical white matter, right parietal and occipital periventricular white matter, and left occipital cortex, most consistent with age indeterminate ischemic changes. Chronic appearing ischemic changes are seen within the cerebellar hemispheres and left occipital cortex. No evidence of acute hemorrhage. Lateral ventricles and remaining midline structures are unremarkable. No acute extra-axial fluid collections. No mass effect. Vascular: No hyperdense vessel or unexpected calcification. Diffuse atherosclerosis. Skull: Normal. Negative for fracture or focal lesion. Sinuses/Orbits: No acute finding. Other: None. IMPRESSION: 1. Indeterminate hypodensities within the right frontal, parietal, and occipital white matter as above, favor age indeterminate small vessel ischemic changes. 2. Chronic appearing ischemic changes within the cerebellar hemispheres and left occipital cortex. 3. No evidence of acute hemorrhage. Electronically Signed   By: Ozell Delores HERO.D.  On: 01/13/2024 16:49   DG CHEST PORT 1 VIEW Result Date: 12/27/2023 CLINICAL DATA:  Port-A-Cath placement. EXAM: PORTABLE CHEST 1 VIEW COMPARISON:  11/23/2019 and CT chest 12/09/2023. FINDINGS: Trachea is midline. Heart size normal. Right sided Port-A-Cath tip is in the SVC. No pneumothorax. Lungs are low in volume. Pleuroparenchymal scarring in the apical right upper lobe, better seen on CT 12/09/2023. Calcified granulomas. Mildly elevated right hemidiaphragm. No pleural fluid. IMPRESSION: Right Port-A-Cath placement.  No pneumothorax.  No acute findings. Electronically Signed   By: Newell Eke M.D.   On: 12/27/2023 12:53   DG C-Arm 1-60 Min-No Report Result Date: 12/27/2023 Fluoroscopy was utilized by the requesting  physician.  No radiographic interpretation.    Microbiology Recent Results (from the past 240 hours)  Blood culture (routine x 2)     Status: Abnormal (Preliminary result)   Collection Time: 01/13/24  4:02 PM   Specimen: BLOOD LEFT FOREARM  Result Value Ref Range Status   Specimen Description   Final    BLOOD LEFT FOREARM Performed at [facility], [address], [place], [place] [address]    Special Requests   Final    Blood Culture adequate volume BOTTLES DRAWN AEROBIC AND ANAEROBIC Performed at [facility], [address], [place], [place] [id number]    Culture  Setup Time   Final    GRAM NEGATIVE RODS GRAM POSITIVE COCCI IN BOTH AEROBIC AND ANAEROBIC BOTTLES CRITICAL RESULT CALLED TO, READ BACK BY AND VERIFIED WITH: PHARMD G ABBOTT 01/14/2024 0650 BY AB    Culture (A)  Final    ESCHERICHIA COLI ENTEROCOCCUS FAECALIS SUSCEPTIBILITIES TO FOLLOW Performed at [facility], [address] [place], [place] [address]    Report Status PENDING  Incomplete   Organism ID, Bacteria ESCHERICHIA COLI  Final   Organism ID, Bacteria ESCHERICHIA COLI  Final      Susceptibility   Escherichia coli - KIRBY BAUER*    CEFAZOLIN  SENSITIVE Sensitive    Escherichia coli - MIC*    AMPICILLIN  8 SENSITIVE Sensitive     CEFEPIME  <=0.12 SENSITIVE Sensitive     CEFTAZIDIME <=1 SENSITIVE Sensitive     CEFTRIAXONE <=0.25 SENSITIVE Sensitive     CIPROFLOXACIN  <=0.25 SENSITIVE Sensitive     GENTAMICIN <=1 SENSITIVE Sensitive     IMIPENEM <=0.25 SENSITIVE Sensitive     TRIMETH/SULFA <=20 SENSITIVE Sensitive     AMPICILLIN /SULBACTAM 4 SENSITIVE Sensitive     PIP/TAZO <=4 SENSITIVE Sensitive ug/mL    * ESCHERICHIA COLI    ESCHERICHIA COLI  Blood Culture ID Panel (Reflexed)     Status: Abnormal   Collection Time: 01/13/24  4:02 PM  Result Value Ref Range Status   Enterococcus faecalis DETECTED (A) NOT DETECTED Final    Comment: CRITICAL RESULT CALLED TO, READ BACK BY  AND VERIFIED WITH: PHARMD G ABBOTT 01/14/2024 0650 BY AB    Enterococcus Faecium NOT DETECTED NOT DETECTED Final   Listeria monocytogenes NOT DETECTED NOT DETECTED Final   Staphylococcus species NOT DETECTED NOT DETECTED Final   Staphylococcus aureus (BCID) NOT DETECTED NOT DETECTED Final   Staphylococcus epidermidis NOT DETECTED NOT DETECTED Final   Staphylococcus lugdunensis NOT DETECTED NOT DETECTED Final   Streptococcus species NOT DETECTED NOT DETECTED Final   Streptococcus agalactiae NOT DETECTED NOT DETECTED Final   Streptococcus pneumoniae NOT DETECTED NOT DETECTED Final   Streptococcus pyogenes NOT DETECTED NOT DETECTED Final   A.calcoaceticus-baumannii NOT DETECTED NOT DETECTED Final   Bacteroides fragilis  NOT DETECTED NOT DETECTED Final   Enterobacterales DETECTED (A) NOT DETECTED Final    Comment: Enterobacterales represent a large order of gram negative bacteria, not a single organism. CRITICAL RESULT CALLED TO, READ BACK BY AND VERIFIED WITH: PHARMD G ABBOTT 01/14/2024 0650 BY AB    Enterobacter cloacae complex NOT DETECTED NOT DETECTED Final   Escherichia coli DETECTED (A) NOT DETECTED Final    Comment: CRITICAL RESULT CALLED TO, READ BACK BY AND VERIFIED WITH: PHARMD G ABBOTT 01/14/2024 0650 BY AB    Klebsiella aerogenes NOT DETECTED NOT DETECTED Final   Klebsiella oxytoca NOT DETECTED NOT DETECTED Final   Klebsiella pneumoniae NOT DETECTED NOT DETECTED Final   Proteus species NOT DETECTED NOT DETECTED Final   Salmonella species NOT DETECTED NOT DETECTED Final   Serratia marcescens NOT DETECTED NOT DETECTED Final   Haemophilus influenzae NOT DETECTED NOT DETECTED Final   Neisseria meningitidis NOT DETECTED NOT DETECTED Final   Pseudomonas aeruginosa NOT DETECTED NOT DETECTED Final   Stenotrophomonas maltophilia NOT DETECTED NOT DETECTED Final   Candida albicans NOT DETECTED NOT DETECTED Final   Candida auris NOT DETECTED NOT DETECTED Final   Candida glabrata NOT  DETECTED NOT DETECTED Final   Candida krusei NOT DETECTED NOT DETECTED Final   Candida parapsilosis NOT DETECTED NOT DETECTED Final   Candida tropicalis NOT DETECTED NOT DETECTED Final   Cryptococcus neoformans/gattii NOT DETECTED NOT DETECTED Final   CTX-M ESBL NOT DETECTED NOT DETECTED Final   Carbapenem resistance IMP NOT DETECTED NOT DETECTED Final   Carbapenem resistance KPC NOT DETECTED NOT DETECTED Final   Carbapenem resistance NDM NOT DETECTED NOT DETECTED Final   Carbapenem resist OXA 48 LIKE NOT DETECTED NOT DETECTED Final   Vancomycin  resistance NOT DETECTED NOT DETECTED Final   Carbapenem resistance VIM NOT DETECTED NOT DETECTED Final    Comment: Performed at [facility], [address] [place], [place] [address]  Resp panel by RT-PCR (RSV, Flu A&B, Covid) Anterior Nasal Swab     Status: None   Collection Time: 01/13/24  5:28 PM   Specimen: Anterior Nasal Swab  Result Value Ref Range Status   SARS Coronavirus 2 by RT PCR NEGATIVE NEGATIVE Final    Comment: (NOTE) SARS-CoV-2 target nucleic acids are NOT DETECTED.  The SARS-CoV-2 RNA is generally detectable in upper respiratory specimens during the acute phase of infection. The lowest concentration of SARS-CoV-2 viral copies this assay can detect is 138 copies/mL. A negative result does not preclude SARS-Cov-2 infection and should not be used as the sole basis for treatment or other patient management decisions. A negative result may occur with  improper specimen collection/handling, submission of specimen other than nasopharyngeal swab, presence of viral mutation(s) within the areas targeted by this assay, and inadequate number of viral copies(<138 copies/mL). A negative result must be combined with clinical observations, patient history, and epidemiological information. The expected result is Negative.  Fact Sheet for Patients:  BloggerCourse.com  Fact Sheet for Healthcare Providers:   SeriousBroker.it  This test is no t yet approved or cleared by the [place]  FDA and  has been authorized for detection and/or diagnosis of SARS-CoV-2 by FDA under an Emergency Use Authorization (EUA). This EUA will remain  in effect (meaning this test can be used) for the duration of the COVID-19 declaration under Section 564(b)(1) of the Act, 21 U.S.C.section 360bbb-3(b)(1), unless the authorization is terminated  or revoked sooner.       Influenza A by PCR NEGATIVE NEGATIVE  Final   Influenza B by PCR NEGATIVE NEGATIVE Final    Comment: (NOTE) The Xpert Xpress SARS-CoV-2/FLU/RSV plus assay is intended as an aid in the diagnosis of influenza from Nasopharyngeal swab specimens and should not be used as a sole basis for treatment. Nasal washings and aspirates are unacceptable for Xpert Xpress SARS-CoV-2/FLU/RSV testing.  Fact Sheet for Patients: BloggerCourse.com  Fact Sheet for Healthcare Providers: SeriousBroker.it  This test is not yet approved or cleared by the [place]  FDA and has been authorized for detection and/or diagnosis of SARS-CoV-2 by FDA under an Emergency Use Authorization (EUA). This EUA will remain in effect (meaning this test can be used) for the duration of the COVID-19 declaration under Section 564(b)(1) of the Act, 21 U.S.C. section 360bbb-3(b)(1), unless the authorization is terminated or revoked.     Resp Syncytial Virus by PCR NEGATIVE NEGATIVE Final    Comment: (NOTE) Fact Sheet for Patients: BloggerCourse.com  Fact Sheet for Healthcare Providers: SeriousBroker.it  This test is not yet approved or cleared by the [place]  FDA and has been authorized for detection and/or diagnosis of SARS-CoV-2 by FDA under an Emergency Use Authorization (EUA). This EUA will remain in effect (meaning this test can be used) for  the duration of the COVID-19 declaration under Section 564(b)(1) of the Act, 21 U.S.C. section 360bbb-3(b)(1), unless the authorization is terminated or revoked.  Performed at [facility], [address] [place], [place] [address]   MRSA Next Gen by PCR, Nasal     Status: None   Collection Time: 01/13/24  9:32 PM   Specimen: Nasal Mucosa; Nasal Swab  Result Value Ref Range Status   MRSA by PCR Next Gen NOT DETECTED NOT DETECTED Final    Comment: (NOTE) The GeneXpert MRSA Assay (FDA approved for NASAL specimens only), is one component of a comprehensive MRSA colonization surveillance program. It is not intended to diagnose MRSA infection nor to guide or monitor treatment for MRSA infections. Test performance is not FDA approved in patients less than 78 years old. Performed at [facility], [address], [place], [place] [address]   Culture, blood (Routine X 2) w Reflex to ID Panel     Status: None (Preliminary result)   Collection Time: 01/15/24  9:25 AM   Specimen: BLOOD LEFT HAND  Result Value Ref Range Status   Specimen Description BLOOD LEFT HAND  Final   Special Requests   Final    BOTTLES DRAWN AEROBIC ONLY Blood Culture adequate volume   Culture   Final    NO GROWTH 3 DAYS Performed at [facility], [address], [place], [place] [address]    Report Status PENDING  Incomplete  Culture, blood (Routine X 2) w Reflex to ID Panel     Status: None (Preliminary result)   Collection Time: 01/15/24  9:26 AM   Specimen: BLOOD LEFT HAND  Result Value Ref Range Status   Specimen Description BLOOD LEFT HAND  Final   Special Requests   Final    BOTTLES DRAWN AEROBIC ONLY Blood Culture adequate volume   Culture   Final    NO GROWTH 3 DAYS Performed at [facility], [address] [place], [place] [address]    Report Status PENDING  Incomplete    Lab Basic Metabolic Panel: Recent Labs  Lab 01/14/24 0500 01/14/24 1448  01/15/24 0508 01/16/24 0826 01/16/24 1134 01/16/24 2242 01/17/24 0319 01/17/24 1009 01/17/24 1646 01/17/24 1739 01/29/2024 0042 2024-01-29  0045  NA 131*   < > 132*   < > 133*   < > 133* 131* 135 133* 133* 133*  K 2.7*   < > 3.8   < > 3.9   < > 3.7 4.1 4.9 4.6 4.9 4.9  CL 99   < > 102   < > 103  --  99 99 101  --   --  98  CO2 14*   < > 17*   < > 17*  --  20* 18* 14*  --   --  12*  GLUCOSE 327*   < > 161*   < > 237*  --  243* 186* 125*  --   --  128*  BUN 13   < > 14   < > 30*  --  32* 37* 38*  --   --  38*  CREATININE 1.28*   < > 1.15*   < > 1.36*  --  1.54* 1.61* 1.87*  --   --  2.17*  CALCIUM  6.5*   < > 6.9*   < > 7.0*  --  7.0* 6.8* 6.8*  --   --  7.2*  MG 1.2*  --  2.8*  --   --   --   --   --   --   --   --   --   PHOS 3.8  --   --   --   --   --   --   --  4.5  --   --   --    < > = values in this interval not displayed.   Liver Function Tests: Recent Labs  Lab 01/14/24 0500 01/15/24 0900 01/16/24 0826 01/16/24 1134 01/17/24 0319 01/17/24 1646  AST 354* 191* 111* 119* 152*  --   ALT 141* 136* 118* 128* 132*  --   ALKPHOS 331* 328* 349* 377* 344*  --   BILITOT 2.8* 2.6* 2.4* 2.5* 2.5*  --   PROT 5.4* 5.3* 5.2* 5.7* 5.7*  --   ALBUMIN  <1.5* 1.7* 1.5* 1.7* 1.6* <1.5*   Recent Labs  Lab 01/13/24 1601  LIPASE 12   No results for input(s): AMMONIA in the last 168 hours. CBC: Recent Labs  Lab 01/13/24 1601 01/14/24 0500 01/15/24 0508 01/16/24 0333 01/16/24 1134 01/16/24 2242 01/17/24 0319 01/17/24 1009 01/17/24 1647 01/17/24 1739 01/20/24 0042 2024/01/20 0045  WBC 10.9*   < > 19.6*   < > 6.4  --  3.0* 1.8* 2.3*  --   --  1.0*  NEUTROABS 7.4  --  18.2*  --  5.7  --  2.5  --   --   --   --  0.6*  HGB 11.5*   < > 8.1*   < > 9.2*   < > 8.3* 7.8* 7.1* 7.8* 9.5* 7.1*  HCT 36.2   < > 24.5*   < > 28.7*   < > 25.4* 24.5* 23.6* 23.0* 28.0* 23.1*  MCV 75.9*   < > 75.2*   < > 75.7*  --  75.1* 76.6* 79.5*  --   --  79.1*  PLT 575*   < > 76*   < > 34*  --  19* 12*  12*  7*  --   --  51*   < > = values in this interval not displayed.   Cardiac Enzymes: No results for input(s): CKTOTAL, CKMB, CKMBINDEX, TROPONINI in the last 168 hours. Sepsis Labs: Recent Labs  Lab 01/15/24 0508 01/16/24 0333 01/17/24 0319 01/17/24 1009 01/17/24 1647 01/17/24 1927 01/17/24 2116 2024/01/30 0045  WBC 19.6*   < > 3.0* 1.8* 2.3*  --   --  1.0*  LATICACIDVEN 2.2*  --   --   --  8.8* 7.9* 8.6*  --    < > = values in this interval not displayed.    Procedures/Operations  See procedure notes.  Shamonica Schadt January 30, 2024, 7:10 AM

## 2024-02-07 NOTE — Progress Notes (Signed)
 Critical WBC 1.0 Called in by lab post patient death

## 2024-02-07 DEATH — deceased

## 2024-02-10 ENCOUNTER — Other Ambulatory Visit

## 2024-02-10 ENCOUNTER — Ambulatory Visit

## 2024-02-10 ENCOUNTER — Ambulatory Visit: Admitting: Nurse Practitioner

## 2024-02-11 ENCOUNTER — Ambulatory Visit: Admitting: Nurse Practitioner

## 2024-02-11 ENCOUNTER — Ambulatory Visit

## 2024-02-11 ENCOUNTER — Other Ambulatory Visit

## 2024-03-10 NOTE — Telephone Encounter (Signed)
 error

## 2024-11-20 ENCOUNTER — Ambulatory Visit
